# Patient Record
Sex: Female | Born: 1960 | Hispanic: No | Smoking: Never smoker
Health system: Southern US, Community
[De-identification: ages and names within clinical notes are randomized; demographics above are authoritative.]

## PROBLEM LIST (undated history)

## (undated) DIAGNOSIS — F329 Major depressive disorder, single episode, unspecified: Secondary | ICD-10-CM

## (undated) DIAGNOSIS — F419 Anxiety disorder, unspecified: Secondary | ICD-10-CM

## (undated) DIAGNOSIS — G4730 Sleep apnea, unspecified: Secondary | ICD-10-CM

## (undated) DIAGNOSIS — K760 Fatty (change of) liver, not elsewhere classified: Secondary | ICD-10-CM

## (undated) DIAGNOSIS — I471 Supraventricular tachycardia, unspecified: Secondary | ICD-10-CM

## (undated) DIAGNOSIS — K648 Other hemorrhoids: Secondary | ICD-10-CM

## (undated) DIAGNOSIS — K449 Diaphragmatic hernia without obstruction or gangrene: Secondary | ICD-10-CM

## (undated) DIAGNOSIS — E119 Type 2 diabetes mellitus without complications: Secondary | ICD-10-CM

## (undated) DIAGNOSIS — I10 Essential (primary) hypertension: Secondary | ICD-10-CM

## (undated) DIAGNOSIS — K603 Anal fistula, unspecified: Secondary | ICD-10-CM

## (undated) DIAGNOSIS — G894 Chronic pain syndrome: Secondary | ICD-10-CM

## (undated) DIAGNOSIS — M25551 Pain in right hip: Secondary | ICD-10-CM

## (undated) DIAGNOSIS — E785 Hyperlipidemia, unspecified: Secondary | ICD-10-CM

## (undated) DIAGNOSIS — K8020 Calculus of gallbladder without cholecystitis without obstruction: Secondary | ICD-10-CM

## (undated) DIAGNOSIS — R748 Abnormal levels of other serum enzymes: Secondary | ICD-10-CM

## (undated) DIAGNOSIS — L0291 Cutaneous abscess, unspecified: Secondary | ICD-10-CM

## (undated) DIAGNOSIS — M25552 Pain in left hip: Principal | ICD-10-CM

## (undated) DIAGNOSIS — F32A Depression, unspecified: Secondary | ICD-10-CM

## (undated) DIAGNOSIS — I639 Cerebral infarction, unspecified: Secondary | ICD-10-CM

## (undated) DIAGNOSIS — K5790 Diverticulosis of intestine, part unspecified, without perforation or abscess without bleeding: Secondary | ICD-10-CM

## (undated) HISTORY — PX: BREAST EXCISIONAL BIOPSY: SUR124

## (undated) HISTORY — DX: Fatty (change of) liver, not elsewhere classified: K76.0

## (undated) HISTORY — DX: Pain in right hip: M25.551

## (undated) HISTORY — DX: Calculus of gallbladder without cholecystitis without obstruction: K80.20

## (undated) HISTORY — DX: Other hemorrhoids: K64.8

## (undated) HISTORY — DX: Chronic pain syndrome: G89.4

## (undated) HISTORY — PX: BREAST SURGERY: SHX581

## (undated) HISTORY — DX: Diverticulosis of intestine, part unspecified, without perforation or abscess without bleeding: K57.90

## (undated) HISTORY — PX: INCISE AND DRAIN ABCESS: PRO64

## (undated) HISTORY — DX: Abnormal levels of other serum enzymes: R74.8

## (undated) HISTORY — DX: Diaphragmatic hernia without obstruction or gangrene: K44.9

## (undated) HISTORY — DX: Anal fistula: K60.3

## (undated) HISTORY — DX: Anal fistula, unspecified: K60.30

## (undated) HISTORY — DX: Hyperlipidemia, unspecified: E78.5

## (undated) HISTORY — DX: Pain in left hip: M25.552

## (undated) HISTORY — DX: Cutaneous abscess, unspecified: L02.91

## (undated) HISTORY — PX: KNEE ARTHROSCOPY: SUR90

---
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
## 1997-11-01 ENCOUNTER — Ambulatory Visit: Admission: RE | Admit: 1997-11-01 | Discharge: 1997-11-01 | Payer: Self-pay | Admitting: Otolaryngology

## 1998-12-24 ENCOUNTER — Emergency Department (HOSPITAL_COMMUNITY): Admission: EM | Admit: 1998-12-24 | Discharge: 1998-12-24 | Payer: Self-pay | Admitting: Emergency Medicine

## 1999-03-15 ENCOUNTER — Ambulatory Visit (HOSPITAL_COMMUNITY): Admission: RE | Admit: 1999-03-15 | Discharge: 1999-03-15 | Payer: Self-pay | Admitting: *Deleted

## 2000-09-22 ENCOUNTER — Emergency Department (HOSPITAL_COMMUNITY): Admission: EM | Admit: 2000-09-22 | Discharge: 2000-09-22 | Payer: Self-pay | Admitting: Emergency Medicine

## 2000-09-26 ENCOUNTER — Encounter: Admission: RE | Admit: 2000-09-26 | Discharge: 2000-09-26 | Payer: Self-pay | Admitting: Internal Medicine

## 2000-10-15 ENCOUNTER — Encounter: Admission: RE | Admit: 2000-10-15 | Discharge: 2000-10-15 | Payer: Self-pay | Admitting: Internal Medicine

## 2000-11-05 ENCOUNTER — Encounter: Admission: RE | Admit: 2000-11-05 | Discharge: 2000-11-05 | Payer: Self-pay | Admitting: Internal Medicine

## 2003-04-24 ENCOUNTER — Emergency Department (HOSPITAL_COMMUNITY): Admission: AD | Admit: 2003-04-24 | Discharge: 2003-04-24 | Payer: Self-pay | Admitting: Emergency Medicine

## 2003-05-07 ENCOUNTER — Emergency Department (HOSPITAL_COMMUNITY): Admission: AD | Admit: 2003-05-07 | Discharge: 2003-05-07 | Payer: Self-pay | Admitting: Family Medicine

## 2003-05-10 ENCOUNTER — Emergency Department (HOSPITAL_COMMUNITY): Admission: EM | Admit: 2003-05-10 | Discharge: 2003-05-10 | Payer: Self-pay | Admitting: Family Medicine

## 2003-05-13 ENCOUNTER — Emergency Department (HOSPITAL_COMMUNITY): Admission: EM | Admit: 2003-05-13 | Discharge: 2003-05-14 | Payer: Self-pay | Admitting: Emergency Medicine

## 2003-05-18 ENCOUNTER — Encounter: Admission: RE | Admit: 2003-05-18 | Discharge: 2003-05-18 | Payer: Self-pay | Admitting: General Surgery

## 2003-05-20 ENCOUNTER — Encounter (INDEPENDENT_AMBULATORY_CARE_PROVIDER_SITE_OTHER): Payer: Self-pay | Admitting: Specialist

## 2003-05-21 ENCOUNTER — Inpatient Hospital Stay (HOSPITAL_COMMUNITY): Admission: AD | Admit: 2003-05-21 | Discharge: 2003-05-23 | Payer: Self-pay | Admitting: General Surgery

## 2003-09-16 ENCOUNTER — Emergency Department (HOSPITAL_COMMUNITY): Admission: EM | Admit: 2003-09-16 | Discharge: 2003-09-16 | Payer: Self-pay | Admitting: Family Medicine

## 2004-08-27 ENCOUNTER — Emergency Department (HOSPITAL_COMMUNITY): Admission: EM | Admit: 2004-08-27 | Discharge: 2004-08-27 | Payer: Self-pay | Admitting: Family Medicine

## 2004-09-16 ENCOUNTER — Emergency Department (HOSPITAL_COMMUNITY): Admission: EM | Admit: 2004-09-16 | Discharge: 2004-09-16 | Payer: Self-pay | Admitting: Family Medicine

## 2007-01-17 ENCOUNTER — Emergency Department (HOSPITAL_COMMUNITY): Admission: EM | Admit: 2007-01-17 | Discharge: 2007-01-17 | Payer: Self-pay | Admitting: Emergency Medicine

## 2007-10-03 ENCOUNTER — Emergency Department (HOSPITAL_COMMUNITY): Admission: EM | Admit: 2007-10-03 | Discharge: 2007-10-03 | Payer: Self-pay | Admitting: Emergency Medicine

## 2007-11-01 ENCOUNTER — Emergency Department (HOSPITAL_COMMUNITY): Admission: EM | Admit: 2007-11-01 | Discharge: 2007-11-01 | Payer: Self-pay | Admitting: Family Medicine

## 2008-07-04 ENCOUNTER — Emergency Department (HOSPITAL_COMMUNITY): Admission: EM | Admit: 2008-07-04 | Discharge: 2008-07-04 | Payer: Self-pay | Admitting: Family Medicine

## 2008-07-11 ENCOUNTER — Emergency Department (HOSPITAL_COMMUNITY): Admission: EM | Admit: 2008-07-11 | Discharge: 2008-07-11 | Payer: Self-pay | Admitting: Family Medicine

## 2009-04-12 ENCOUNTER — Other Ambulatory Visit: Payer: Self-pay | Admitting: Specialist

## 2009-04-12 ENCOUNTER — Encounter: Admission: RE | Admit: 2009-04-12 | Discharge: 2009-04-12 | Payer: Self-pay | Admitting: Specialist

## 2009-04-14 ENCOUNTER — Other Ambulatory Visit: Payer: Self-pay | Admitting: Specialist

## 2009-04-14 ENCOUNTER — Inpatient Hospital Stay (HOSPITAL_COMMUNITY): Admission: EM | Admit: 2009-04-14 | Discharge: 2009-04-16 | Payer: Self-pay | Admitting: Orthopedic Surgery

## 2009-04-14 HISTORY — PX: SHOULDER SURGERY: SHX246

## 2009-10-15 ENCOUNTER — Emergency Department (HOSPITAL_COMMUNITY): Admission: EM | Admit: 2009-10-15 | Discharge: 2009-10-15 | Payer: Self-pay | Admitting: Emergency Medicine

## 2010-01-06 ENCOUNTER — Emergency Department (HOSPITAL_COMMUNITY): Admission: EM | Admit: 2010-01-06 | Discharge: 2010-01-06 | Payer: Self-pay | Admitting: Family Medicine

## 2010-03-28 ENCOUNTER — Emergency Department (HOSPITAL_COMMUNITY)
Admission: EM | Admit: 2010-03-28 | Discharge: 2010-03-28 | Payer: Self-pay | Source: Home / Self Care | Admitting: Family Medicine

## 2010-05-24 LAB — CULTURE, ROUTINE-ABSCESS

## 2010-05-26 LAB — CULTURE, ROUTINE-ABSCESS

## 2010-05-31 LAB — POCT PREGNANCY, URINE: Preg Test, Ur: NEGATIVE

## 2010-05-31 LAB — CBC
HCT: 36.8 % (ref 36.0–46.0)
Hemoglobin: 11.6 g/dL — ABNORMAL LOW (ref 12.0–15.0)
MCHC: 31.5 g/dL (ref 30.0–36.0)
MCHC: 31.6 g/dL (ref 30.0–36.0)
Platelets: 446 10*3/uL — ABNORMAL HIGH (ref 150–400)
RBC: 5.04 MIL/uL (ref 3.87–5.11)
RBC: 5.08 MIL/uL (ref 3.87–5.11)
RDW: 16 % — ABNORMAL HIGH (ref 11.5–15.5)

## 2010-05-31 LAB — POCT I-STAT, CHEM 8
Calcium, Ion: 1.2 mmol/L (ref 1.12–1.32)
HCT: 39 % (ref 36.0–46.0)
Hemoglobin: 13.3 g/dL (ref 12.0–15.0)
Sodium: 138 mEq/L (ref 135–145)
TCO2: 26 mmol/L (ref 0–100)

## 2010-05-31 LAB — HEMOGLOBIN A1C: Mean Plasma Glucose: 246 mg/dL

## 2010-05-31 LAB — BASIC METABOLIC PANEL
BUN: 12 mg/dL (ref 6–23)
BUN: 8 mg/dL (ref 6–23)
CO2: 23 mEq/L (ref 19–32)
Calcium: 8.9 mg/dL (ref 8.4–10.5)
Creatinine, Ser: 0.58 mg/dL (ref 0.4–1.2)
Creatinine, Ser: 0.71 mg/dL (ref 0.4–1.2)
GFR calc Af Amer: >60 mL/min (ref >60)
Glucose, Bld: 237 mg/dL — ABNORMAL HIGH (ref 70–99)
Glucose, Bld: 300 mg/dL — ABNORMAL HIGH (ref 70–99)

## 2010-05-31 LAB — GLUCOSE, CAPILLARY
Glucose-Capillary: 222 mg/dL — ABNORMAL HIGH (ref 70–99)
Glucose-Capillary: 223 mg/dL — ABNORMAL HIGH (ref 70–99)
Glucose-Capillary: 289 mg/dL — ABNORMAL HIGH (ref 70–99)
Glucose-Capillary: 339 mg/dL — ABNORMAL HIGH (ref 70–99)

## 2010-07-28 NOTE — Consult Note (Signed)
NAMEMARQUETA, PULLEY NO.:  [id number]   MEDICAL RECORD NO.:  [id number]                   PATIENT TYPE:  EMS   LOCATION:  ED                                   FACILITY:  [facility]   PHYSICIAN:  Angelia Mould. Derrell Lolling, M.D.             DATE OF BIRTH:  1960-12-23   DATE OF CONSULTATION:  05/14/2003  DATE OF DISCHARGE:  05/14/2003                                   CONSULTATION   REASON FOR CONSULTATION:  Evaluate painful mass, left breast.   HISTORY OF PRESENT ILLNESS:  This is a 50 year old white female with  diabetes and morbid obesity.  She states that she has had a left breast mass  which has been tender for about 3 weeks.  She has not had any trauma.  She  denies drainage.  She has been seen at [facility] 3 times in the  past 3 weeks.  She has been placed on antibiotics.  She denies fever or  chills.  Denies any prior breast infection or breast problems.  Her last  mammogram was done at [facility] 2 years ago, and was normal according  to the patient.   She came to the [facility] emergency room tonight stating that she was  frustrated because it was not getting better, but it was not getting worse.  She was evaluated by Dr. Juliene Pina.  Dr. Verlan Friends called me to  evaluate, concerned that this might be an abscess.   PAST HISTORY:  1. Morbid obesity.  2. Type 2 diabetes.  3. Hypertension.  4. She denies any history of cancer, heart disease, asthma, or stroke.   CURRENT MEDICATIONS:  1. Glucotrol.  2. Lipitor.  3. Some type of antihypertensive.   DRUG ALLERGIES:  None known.   SOCIAL HISTORY:  The patient lives in [place].  She lives with her  boyfriend.  She denies the use of alcohol or tobacco.  She does not have any  insurance.  She works at [employer] on [address].   FAMILY HISTORY:  Mother living at age 29 and has asthma.  Father died at age  58.  He was diabetic and had a stroke.  Three brothers and  3 sisters living  and well.   REVIEW OF SYSTEMS:  All systems were reviewed.  They are noncontributory,  except as described above.   PHYSICAL EXAMINATION:  GENERAL:  Alert, morbid obesity female in no apparent  distress.  VITAL SIGNS:  Temperature 99.2, pulse 106, respirations 22, blood pressure  156/92.  HEENT:  Eyes - sclerae are clear.  Extraocular movements intact.  Ears,  nose, mouth, and throat - nose, lips, tongue, and oropharynx with gross  lesions.  NECK:  Supple, nontender.  No adenopathy, no mass.  LUNGS:  Clear to auscultation.  No wheezing, no chest wall tenderness.  HEART:  Regular rate and rhythm.  No murmur.  BREASTS:  The  left breast is examined.  In the central portion of the left  breast slightly above the areola, but mostly behind the areola, there is  about a 15 cm transverse x 8-10 cm vertical firm mass.  There is slight  erythema.  This is somewhat tender, but not exquisitely tender.  It is  mostly a firm mass.  There is no fluctuance noted.  I do not feel any  adenopathy.  ABDOMEN:  Morbidly obese.  No palpable mass.  Liver and spleen not obviously  enlarged.   PROCEDURE NOTE:  The left breast was prepped and draped in a sterile  fashion.  One percent Xylocaine with epinephrine was used as local  infiltration anesthetic.  An 18 gauge needle was passed several times into  several areas of the left breast mass.  I really only got about 1.5 cc of  fluid out of the breast, and this did not look purulent.  I did send it for  culture.  After I removed the needle, a little bit of purulent fluid was  noted to be coming though the needle tract, but not much.   LABORATORY WORK:  Hemoglobin 9.6, white count 16,000.  Fingerstick glucose  363.   ASSESSMENT:  1. Cellulitis of the left breast.  While there is not an obvious abscess,     there could be an occult abscess that may require drainage and biopsy to     rule out cancer.  2. Morbid obesity.  3. Diabetes,  poorly controlled.  4. Hypertension.  5. Anemia of uncertain etiology.  6. No regular medical care.   PLAN:  1. I advised the patient to undergo examination under anesthesia and     incision and drainage of her left breast mass and left breast biopsy     tonight.  I told her I was concerned about the infection, and concerned     about her diabetes and her failure to respond to antibiotics.  She     declined to have the surgery.  I tried to talk her into this several     times, but she refused, stating that she had to go to work or she would     lose her job.  2. Since she refuses surgery, we are going to give her Rocephin 2 gm IV, and     place her on Augmentin 875 mg p.o. b.i.d. x10 days.  3. We are going to arrange for mammograms and a left breast ultrasound     tomorrow at the breast center.  The ER nurse is going to make those     arrangements.  4. I have asked her to come back and see me in the office in 3-4 days.  I     have called my office and left messages to create a chart for her so I     can see her as soon as she comes.   I have told her repeatedly that this is less than ideal therapy, and that  the breast infection could become more severe and more threatening in the  interim.  Despite my advise, she declines any surgical intervention at this  time.  She agrees to come back to the emergency room if the pain gets worse,  or she starts developing fever or skin changes.  Angelia Mould. Derrell Lolling, M.D.   HMI/MEDQ  D:  05/14/2003  T:  05/14/2003  Job:  [id number]   cc:   Juliene Pina, M.D.   [facility]   [facility]

## 2010-07-28 NOTE — Op Note (Signed)
[name], LEVEN NO.:  [id number]   MEDICAL RECORD NO.:  [id number]                   PATIENT TYPE:  OBV   LOCATION:  0450                                 FACILITY:  [facility]   PHYSICIAN:  Angelia Mould. Derrell Lolling, M.D.             DATE OF BIRTH:  [birth year]   DATE OF PROCEDURE:  05/20/2003  DATE OF DISCHARGE:                                 OPERATIVE REPORT   PREOPERATIVE DIAGNOSIS:  Left breast abscess.   POSTOPERATIVE DIAGNOSIS:  Left breast abscess.   OPERATION PERFORMED:  1. Incision and drainage of complex left breast abscess.  2. Left breast biopsy.   SURGEON:  Dr. Claud Kelp.   OPERATIVE INDICATIONS:  This is a morbidly obese 50 year old diabetic  female, who presented to the [facility] Emergency Room six days ago.  At  that point, she had had a tender left breast mass for about 3 weeks and had  been on two separate courses of antibiotics following visits to [facility].  At that time, I felt she had a left breast abscess and  advised admission to the hospital and drainage of the abscess under  anesthesia.  She declined to have that done and refused admission to the  hospital.  I was able to perform a needle aspiration and got a little bit of  purulent fluid, gave her some intravenous antibiotics in the emergency room  and placed her on oral Augmentin.  The cultures returned methicillin-  resistant Staphylococcus aureus.  She showed up in my office yesterday  stating that she was no worse but no better.  On exam, she has about a 10 cm  x 5 cm area of markedly thickened breast, mostly in the subareolar area and  extending medially.  This is not fluctuant.  The skin has a little bit of  edema overlying it.  We also sent her for imaging and bilateral mammograms  and a left breast ultrasound was performed on May 18, 2003.  No evidence of  malignancy, but there was evidence of a subareolar fluid collection.  I  convinced her to have  the fluid collection drained in the operating room and  convinced her to be admitted to the hospital because of the MRSA infection.   OPERATIVE TECHNIQUE:  Following the induction of general endotracheal  anesthesia, the patient's left breast was prepped and draped in a sterile  fashion.  I made an incision at the areolar margin superior and medially.  Dissection was carried straight down into a breast abscess which was  somewhat thin and without an odor.  I cultured this fluid again.  I broke up  a couple of loculations under the areolar and extending out medially but  once I did this, it seemed that I had completely evacuated everything.  The  tissue at the base of the wound were relatively soft, and it felt like an  abscess cavity wall.  The  tissues closer to the surface were extremely  thickened and almost fibrotic, and so I took a biopsy of these tissues near  the areolar and sent that for routine histology.  Hemostasis was adequate  and achieved with electrocautery.  The wound was irrigated with saline.  I  placed a single suture, closing the skin in the center of the wound but  mostly without the skin opened and placed Penrose drains on either side,  sewing the  Penrose drains in with a nylon suture.  The wound was irrigated one more  time.  A fluffy absorbent bandage was placed and the patient taken to the  recovery room in stable condition.  The estimated blood loss was about 25  mL.  Complications none.  Sponge, needle, and instrument counts were  correct.                                               Angelia Mould. Derrell Lolling, M.D.    HMI/MEDQ  D:  05/20/2003  T:  05/20/2003  Job:  [id number]   cc:   [facility]

## 2010-09-27 ENCOUNTER — Other Ambulatory Visit (INDEPENDENT_AMBULATORY_CARE_PROVIDER_SITE_OTHER): Payer: Self-pay | Admitting: General Surgery

## 2010-09-27 ENCOUNTER — Ambulatory Visit (HOSPITAL_COMMUNITY)
Admission: RE | Admit: 2010-09-27 | Discharge: 2010-09-27 | Disposition: A | Discharge status: Home / Self Care | Payer: BC Managed Care – PPO | Source: Ambulatory Visit | Attending: General Surgery | Admitting: General Surgery

## 2010-09-27 ENCOUNTER — Encounter (HOSPITAL_COMMUNITY)
Admission: RE | Admit: 2010-09-27 | Discharge: 2010-09-27 | Disposition: A | Discharge status: Home / Self Care | Payer: BC Managed Care – PPO | Source: Ambulatory Visit | Attending: General Surgery | Admitting: General Surgery

## 2010-09-27 DIAGNOSIS — Z01811 Encounter for preprocedural respiratory examination: Secondary | ICD-10-CM

## 2010-09-27 DIAGNOSIS — Z01812 Encounter for preprocedural laboratory examination: Secondary | ICD-10-CM | POA: Insufficient documentation

## 2010-09-27 DIAGNOSIS — Z01810 Encounter for preprocedural cardiovascular examination: Secondary | ICD-10-CM | POA: Insufficient documentation

## 2010-09-27 DIAGNOSIS — K449 Diaphragmatic hernia without obstruction or gangrene: Secondary | ICD-10-CM | POA: Insufficient documentation

## 2010-09-27 LAB — BASIC METABOLIC PANEL
BUN: 8 mg/dL (ref 6–23)
CO2: 24 mEq/L (ref 19–32)
Chloride: 103 mEq/L (ref 96–112)
Creatinine, Ser: 0.56 mg/dL (ref 0.50–1.10)
Glucose, Bld: 176 mg/dL — ABNORMAL HIGH (ref 70–99)

## 2010-09-27 LAB — DIFFERENTIAL
Eosinophils Absolute: 0.4 10*3/uL (ref 0.0–0.7)
Lymphs Abs: 3.8 10*3/uL (ref 0.7–4.0)
Monocytes Relative: 6 % (ref 3–12)
Neutro Abs: 9.9 10*3/uL — ABNORMAL HIGH (ref 1.7–7.7)
Neutrophils Relative %: 66 % (ref 43–77)

## 2010-09-27 LAB — CBC
Hemoglobin: 11.7 g/dL — ABNORMAL LOW (ref 12.0–15.0)
MCH: 23.1 pg — ABNORMAL LOW (ref 26.0–34.0)
MCV: 70.9 fL — ABNORMAL LOW (ref 78.0–100.0)
RBC: 5.06 MIL/uL (ref 3.87–5.11)

## 2010-09-29 ENCOUNTER — Ambulatory Visit (HOSPITAL_COMMUNITY)
Admission: RE | Admit: 2010-09-29 | Discharge: 2010-10-01 | Disposition: A | Discharge status: Home / Self Care | Payer: BC Managed Care – PPO | Source: Ambulatory Visit | Attending: General Surgery | Admitting: General Surgery

## 2010-09-29 ENCOUNTER — Other Ambulatory Visit (INDEPENDENT_AMBULATORY_CARE_PROVIDER_SITE_OTHER): Payer: Self-pay | Admitting: General Surgery

## 2010-09-29 DIAGNOSIS — L732 Hidradenitis suppurativa: Secondary | ICD-10-CM | POA: Insufficient documentation

## 2010-09-29 DIAGNOSIS — E669 Obesity, unspecified: Secondary | ICD-10-CM | POA: Insufficient documentation

## 2010-10-05 ENCOUNTER — Telehealth (INDEPENDENT_AMBULATORY_CARE_PROVIDER_SITE_OTHER): Payer: Self-pay | Admitting: General Surgery

## 2010-10-05 NOTE — Telephone Encounter (Signed)
Pt called on 10-04-10 re pain med refill. Dr. Carolynne Edouard said she could have vicodin 5/500 called in or refill Percocet. Pt req Percocet. Dr. Carolynne Edouard will write RX today for pt to pick-up. Complete. RX for Percocet at front desk. Pt aware.

## 2010-10-10 NOTE — Op Note (Signed)
[name], [name] NO.:  [id number]  MEDICAL RECORD NO.:  [id number]  LOCATION:  SDSC                         FACILITY:  [facility]  PHYSICIAN:  Ollen Gross. Vernell Morgans, M.D. DATE OF BIRTH:  02-05-1961  DATE OF PROCEDURE:  09/29/2010 DATE OF DISCHARGE:                              OPERATIVE REPORT   PREOPERATIVE DIAGNOSIS:  Hidradenitis in the right groin area and left perirectal area.  POSTOPERATIVE DIAGNOSIS:  Hidradenitis in the right groin area and left perirectal area.  PROCEDURE:  Incision and drainage of right groin and left perirectal hidradenitis and excision of hidradenitis from the right groin.  SURGEON:  Ollen Gross. Vernell Morgans, MD  ANESTHESIA:  General endotracheal.  PROCEDURE:  After informed consent was obtained, the patient was brought to the operating room and placed in supine position on the operating room table.  After adequate induction of general anesthesia, the patient was placed in a frog leg-type position.  We could see the area in the right upper thigh and groin area.  This area was prepped with Betadine and draped in the usual sterile manner.  The area on the upper inner thigh was pretty well circumscribed.  We were able to remove this area completely by ellipsing it out.  The incision was made with #10 blade knife around the area and then the incision was carried down through the skin and subcutaneous fat sharply with electrocautery until the area was completely excised.  We never ran into any purulence there.  The wound was wide open and looked good.  We irrigated with copious amounts of saline, infiltrated with Exparel, and then closed it primarily with 3-0 nylon interrupted vertical mattress stitches.  Once this was accomplished, I placed antibiotic ointment on it and covered it with sterile gauze.  Attention was then turned higher in the right groin area.  There was a small draining area.  We probed this with a silver probe and then  opened it sharply with electrocautery.  We fulgurated the granulation tissue.  I probed it with my finger and it did not track any further.  We then packed it with gauze and applied a sterile dressing. She tolerated this portion well.  Next, we then moved back onto the stretcher and then moved her into a prone position on the operating room table.  All pressure points were padded.  Her buttocks were retracted laterally with tape.  Her perirectal area was then prepped with Betadine and draped in the usual sterile manner.  In the left perirectal space, the patient had some hidradenitis.  All the openings connected to each other.  We first probed them with silver probes and opened the tracts. We then excised the granulation tissue and skin openings sharply with electrocautery back to healthy fatty tissue.  Hemostasis was achieved using Bovie electrocautery.  The wound was infiltrated with Exparel and then once it was clean it was packed with gauze and sterile dressings were applied. The patient tolerated the procedure well.  At the end of the case, all needle, sponge, and instrument counts were correct.  The patient was awakened and taken to the recovery room in stable condition.     Ollen Gross. Vernell Morgans,  M.D.     PST/MEDQ  D:  09/29/2010  T:  09/29/2010  Job:  [id number]  Electronically Signed by Chevis Pretty III M.D. on 10/10/2010 03:49:56 AM

## 2010-10-11 ENCOUNTER — Telehealth (INDEPENDENT_AMBULATORY_CARE_PROVIDER_SITE_OTHER): Payer: Self-pay | Admitting: General Surgery

## 2010-10-11 NOTE — Telephone Encounter (Signed)
PT CALLED RE PAIN MED REFILL/ STANDARD POST-OP REFILL HYDROCODONE 5/325 #30 [facility] [address]/ [phone]. PER PROTOCOL.

## 2010-10-17 ENCOUNTER — Encounter (INDEPENDENT_AMBULATORY_CARE_PROVIDER_SITE_OTHER): Payer: Self-pay | Admitting: General Surgery

## 2010-10-17 ENCOUNTER — Ambulatory Visit (INDEPENDENT_AMBULATORY_CARE_PROVIDER_SITE_OTHER): Payer: BC Managed Care – PPO | Admitting: General Surgery

## 2010-10-17 DIAGNOSIS — L732 Hidradenitis suppurativa: Secondary | ICD-10-CM | POA: Insufficient documentation

## 2010-10-17 NOTE — Patient Instructions (Signed)
Continue to shower and keep clean gauze on areas

## 2010-10-19 NOTE — Progress Notes (Signed)
Subjective:     Patient ID: [name], female   DOB: September 13, 1960, 50 y.o.   MRN: [id number]  HPI The patient is a 50 year old female who is now about 3 weeks out from excision of some hidradenitis from her right inner thigh and her perirectal region. She's had some clearish drainage from the thigh wound but otherwise seems to be doing well. She denies any fevers or chills Review of Systems     Objective:   Physical Exam On exam the right thigh wound looks relatively clean. We were able to remove all the stitches today and she tolerated this well. Her perirectal wound is very clean and open. It is getting shallow with good granulation tissue.    Assessment:     3 weeks postop from excision of hidradenitis from the right inner thigh and perirectal region.    Plan:     She will continue to shower and change the dressings daily. Overall she seems to be healing nicely. We will plan to see her back in about 2-3 weeks to check her progress.

## 2010-10-30 ENCOUNTER — Encounter (INDEPENDENT_AMBULATORY_CARE_PROVIDER_SITE_OTHER): Payer: Self-pay | Admitting: General Surgery

## 2010-10-31 ENCOUNTER — Ambulatory Visit (INDEPENDENT_AMBULATORY_CARE_PROVIDER_SITE_OTHER): Payer: BC Managed Care – PPO | Admitting: General Surgery

## 2010-10-31 ENCOUNTER — Encounter (INDEPENDENT_AMBULATORY_CARE_PROVIDER_SITE_OTHER): Payer: Self-pay | Admitting: General Surgery

## 2010-10-31 DIAGNOSIS — L732 Hidradenitis suppurativa: Secondary | ICD-10-CM

## 2010-10-31 NOTE — Progress Notes (Signed)
Subjective:     Patient ID: [name], female   DOB: June 02, 1960, 51 y.o.   MRN: [id number]  HPI The patient is a 50 year old female who is now several weeks out from a an excision of hidradenitis from her right inner thigh and from her perirectal region. She is continuing to improve. She is noting less pain and less drainage from each area. She is actually having no drainage now from the right thigh incision.  Review of Systems     Objective:   Physical Exam On exam her right inner thigh incision is healing nicely. There is no drainage or evidence of infection. In the anterior right groin there is a small open area with good granulation tissue that is clean. The left pararectal space also is clean and flat with good granulation tissue. This area is almost completely epithelialized.    Assessment:     Several weeks out from excision of hidradenitis in the right inner thigh and perirectal area    Plan:     The wounds all look very clean. Encouraged her to continue to shower and keep these areas clean and dry. We'll plan to see her back in about 3 weeks to check her progress.

## 2010-10-31 NOTE — Patient Instructions (Signed)
Continue to wash areas daily and keep them clean and dry

## 2010-11-06 ENCOUNTER — Telehealth (INDEPENDENT_AMBULATORY_CARE_PROVIDER_SITE_OTHER): Payer: Self-pay | Admitting: General Surgery

## 2010-11-06 NOTE — Telephone Encounter (Signed)
PT CALLED FOR PAIN MED REFILL. SHE WOULD LIKE SOMETHING LESS STRONG. STANDARD HYDROCODONE 5/325MG  # 30 [facility] [address] [phone]. PT AWARE.

## 2010-11-17 ENCOUNTER — Telehealth (INDEPENDENT_AMBULATORY_CARE_PROVIDER_SITE_OTHER): Payer: Self-pay | Admitting: General Surgery

## 2010-11-17 NOTE — Telephone Encounter (Signed)
FAXED REQ FOR HYDROCODONE-ACET 5-325 MG #30 RECEIVED FROM [facility]/ [phone]/ PHARMACY NOTIFIED OF OK FOR REFILL X1.

## 2010-11-20 ENCOUNTER — Encounter (INDEPENDENT_AMBULATORY_CARE_PROVIDER_SITE_OTHER): Payer: Self-pay | Admitting: General Surgery

## 2010-11-20 ENCOUNTER — Ambulatory Visit (INDEPENDENT_AMBULATORY_CARE_PROVIDER_SITE_OTHER): Payer: BC Managed Care – PPO | Admitting: General Surgery

## 2010-11-20 VITALS — BP 142/88 | HR 70 | Temp 98.3°F

## 2010-11-20 DIAGNOSIS — L732 Hidradenitis suppurativa: Secondary | ICD-10-CM

## 2010-11-20 NOTE — Patient Instructions (Signed)
Keep area clean and keep dry gauze on area as needed

## 2010-11-21 ENCOUNTER — Telehealth (INDEPENDENT_AMBULATORY_CARE_PROVIDER_SITE_OTHER): Payer: Self-pay | Admitting: General Surgery

## 2010-11-21 NOTE — Telephone Encounter (Signed)
PT CALLED REQUESTING PAIN MED REFILL. DR. Carolynne Edouard OK'D REFILL FOR VICODIN 5/325 #20 CALLED TO [facility] [address] [phone]. PT AWARE. LAST REFILL PER PHARMACY WAS ON 11-17-10.

## 2010-11-23 NOTE — Progress Notes (Signed)
Subjective:     Patient ID: [name], female   DOB: Aug 20, 1960, 50 y.o.   MRN: [id number]  HPI The patient is a 50 year old female who is several weeks out now from excision of some hidradenitis from her right inner thigh as well as her perirectal region. Since her last visit she is developed some increasing pain and bleeding from the perirectal wound. She denies any fevers.  Review of Systems     Objective:   Physical Exam On exam the right inner thigh wound is very clean and seems to have healed  nicely. The perirectal wound actually seems to be almost completely healed but she has some proud granulation tissue with some drainage from this area. I probed this area with a Q-tip and it is very localized and does not track anywhere. Would try to destroy some of this granulation tissue today with some silver nitrate.       Assessment:       Several weeks out from excision of hidradenitis in the right inner thigh and perirectal region  Plan:     She will continue to keep the area clean and dry. We will see her back in 3 or 4 weeks to check her progress.

## 2010-11-29 ENCOUNTER — Telehealth (INDEPENDENT_AMBULATORY_CARE_PROVIDER_SITE_OTHER): Payer: Self-pay | Admitting: General Surgery

## 2010-11-29 NOTE — Telephone Encounter (Signed)
[facility] REQUEST RECE[facility]ED [facility] HYDROCODONE-ACET 5-325MG  #20. RX APPROVED X1 PER T.O. DR. P. TOTH.

## 2010-12-08 LAB — POCT I-STAT, CHEM 8
Creatinine, Ser: 0.9
Hemoglobin: 13.6
Potassium: 3.3 — ABNORMAL LOW
Sodium: 138

## 2010-12-08 LAB — POCT CARDIAC MARKERS
CKMB, poc: 1.2
Myoglobin, poc: 65.9

## 2010-12-11 ENCOUNTER — Telehealth (INDEPENDENT_AMBULATORY_CARE_PROVIDER_SITE_OTHER): Payer: Self-pay

## 2010-12-11 NOTE — Telephone Encounter (Signed)
Pt requested more Hydrocodone.  Per Dr Carolynne Edouard, she can have #30 of the Hydrocodone  5/325 and 1 refill.  [facility] [phone].

## 2010-12-25 ENCOUNTER — Ambulatory Visit (INDEPENDENT_AMBULATORY_CARE_PROVIDER_SITE_OTHER): Payer: BC Managed Care – PPO | Admitting: General Surgery

## 2010-12-25 ENCOUNTER — Encounter (INDEPENDENT_AMBULATORY_CARE_PROVIDER_SITE_OTHER): Payer: Self-pay | Admitting: General Surgery

## 2010-12-25 VITALS — BP 160/100 | HR 90 | Temp 98.4°F | Ht 63.0 in | Wt 261.4 lb

## 2010-12-25 DIAGNOSIS — L0231 Cutaneous abscess of buttock: Secondary | ICD-10-CM

## 2010-12-25 NOTE — Progress Notes (Signed)
Patient returns to the office status post drainage and excision of hidradenitis of the right inner thigh and also incision and drainage of a buttock abscess likely secondary to hidradenitis done by Dr. Carolynne Edouard about 3 months ago. Her thigh is completely healed but he has been following the buttock wound for some persistent drainage. She states that for 5 days ago she developed increasing pain and drainage in the area. She called the office for an urgent appointment. New  Pertinent exam shows a partially healed surgical site at the left buttock an area of granulation and purulent drainage and small area of fluctuance underneath this.  I recommended incision and drainage under local in the office and she was agreeable. I unroofed a small cavity about a centimeter in diameter back underneath the previous scar. This was packed with iodoform gauze. She is given a prescription for Vicodin and one week of Keflex  She'll return to see Dr. Carolynne Edouard in 2-3 weeks.

## 2010-12-25 NOTE — Patient Instructions (Signed)
Removed bandage and packing tomorrow and then begin daily soaks and keep covered. Call if it is feeling any worse but otherwise keep her scheduled appointment in 2-3 weeks.

## 2010-12-27 ENCOUNTER — Telehealth (INDEPENDENT_AMBULATORY_CARE_PROVIDER_SITE_OTHER): Payer: Self-pay | Admitting: General Surgery

## 2010-12-27 NOTE — Telephone Encounter (Signed)
Pt called today and stated that the vicodin is not helping with the pain. She would like something stronger. [facility] uses [facility] [address] [phone]. Allergies to Tramadol. You can call her on her cell [phone] you can leave a message

## 2010-12-28 ENCOUNTER — Telehealth (INDEPENDENT_AMBULATORY_CARE_PROVIDER_SITE_OTHER): Payer: Self-pay | Admitting: General Surgery

## 2010-12-28 ENCOUNTER — Encounter (INDEPENDENT_AMBULATORY_CARE_PROVIDER_SITE_OTHER): Payer: BC Managed Care – PPO | Admitting: General Surgery

## 2010-12-28 NOTE — Telephone Encounter (Signed)
NO PAIN MED REFILL PER DR. TOTH DUE TO PHARMACY CALL RE MULTIPLE RX'S FOR PAIN MEDICATIONS FROM OTHER DR.'S

## 2010-12-28 NOTE — Telephone Encounter (Signed)
Pt called in stating she wanted another prescription issued, that the hydrocodone currently prescribed only works for an hour according to pt. Pt stated called yesterday and wanted to know if it will be issued. She can be called back at [phone] and a message can be left on vmail if she is unable to pick up.

## 2010-12-28 NOTE — Telephone Encounter (Signed)
Per Dr. Carolynne Edouard patient is to follow up with him,  No prescription will be written without physical evaluation from Dr. Carolynne Edouard.

## 2011-01-19 ENCOUNTER — Encounter (INDEPENDENT_AMBULATORY_CARE_PROVIDER_SITE_OTHER): Payer: Self-pay | Admitting: General Surgery

## 2011-01-19 ENCOUNTER — Ambulatory Visit (INDEPENDENT_AMBULATORY_CARE_PROVIDER_SITE_OTHER): Payer: BC Managed Care – PPO | Admitting: General Surgery

## 2011-01-19 ENCOUNTER — Other Ambulatory Visit (INDEPENDENT_AMBULATORY_CARE_PROVIDER_SITE_OTHER): Payer: Self-pay | Admitting: General Surgery

## 2011-01-19 VITALS — BP 164/80 | HR 96 | Temp 99.4°F | Resp 16 | Ht 63.0 in | Wt 254.2 lb

## 2011-01-19 DIAGNOSIS — K603 Anal fistula: Secondary | ICD-10-CM

## 2011-01-19 NOTE — Progress Notes (Signed)
Subjective:     Patient ID: [name], female   DOB: 06/10/1960, 50 y.o.   MRN: [id number]  HPI The patient is a 50 year old white female who is now a months out from excision of some hidradenitis from her right thigh as well as her left perirectal region. The right thigh area healed up very nicely. She's had persistent pain and drainage from the left perirectal region.  Review of Systems  Constitutional: Negative.   HENT: Negative.   Eyes: Negative.   Respiratory: Negative.   Cardiovascular: Negative.   Gastrointestinal: Positive for blood in stool and rectal pain.  Genitourinary: Negative.   Musculoskeletal: Negative.   Skin: Negative.   Neurological: Negative.   Hematological: Negative.   Psychiatric/Behavioral: Negative.        Objective:   Physical Exam  Constitutional: She is oriented to person, place, and time. She appears well-developed and well-nourished.  HENT:  Head: Normocephalic and atraumatic.  Eyes: Conjunctivae and EOM are normal. Pupils are equal, round, and reactive to light.  Neck: Neck supple.  Cardiovascular: Normal rate, regular rhythm and normal heart sounds.   Pulmonary/Chest: Effort normal and breath sounds normal.  Abdominal: Soft. Bowel sounds are normal.  Genitourinary:       The patient has been persistent open area in the left pararectal space. Today I can feel a palpable cord running from this area to the rectum.  Musculoskeletal: Normal range of motion.  Neurological: She is alert and oriented to person, place, and time.  Skin: Skin is warm and dry.  Psychiatric: She has a normal mood and affect. Her behavior is normal.       Assessment:     Couple months postop from excision of some hidradenitis. 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 She would then be a candidate for either a fistulotomy or placement of a seton. I've discussed in detail the risks and benefits of the surgery to do this as well as some of the technical aspects and she understands and wishes to proceed

## 2011-02-06 ENCOUNTER — Encounter (HOSPITAL_COMMUNITY): Payer: Self-pay | Admitting: Pharmacy Technician

## 2011-02-07 ENCOUNTER — Encounter (HOSPITAL_COMMUNITY): Payer: Self-pay | Admitting: *Deleted

## 2011-02-07 ENCOUNTER — Emergency Department (HOSPITAL_COMMUNITY)
Admission: EM | Admit: 2011-02-07 | Discharge: 2011-02-08 | Disposition: A | Discharge status: Home / Self Care | Payer: BC Managed Care – PPO | Attending: Emergency Medicine | Admitting: Emergency Medicine

## 2011-02-07 DIAGNOSIS — Z09 Encounter for follow-up examination after completed treatment for conditions other than malignant neoplasm: Secondary | ICD-10-CM | POA: Insufficient documentation

## 2011-02-07 DIAGNOSIS — L02419 Cutaneous abscess of limb, unspecified: Secondary | ICD-10-CM

## 2011-02-07 DIAGNOSIS — IMO0002 Reserved for concepts with insufficient information to code with codable children: Secondary | ICD-10-CM | POA: Insufficient documentation

## 2011-02-07 DIAGNOSIS — I10 Essential (primary) hypertension: Secondary | ICD-10-CM | POA: Insufficient documentation

## 2011-02-07 HISTORY — DX: Essential (primary) hypertension: I10

## 2011-02-07 HISTORY — DX: Anxiety disorder, unspecified: F41.9

## 2011-02-07 NOTE — ED Notes (Signed)
Pt states that she has had a right axillary abscess for past two weeks. States that abscess has gotten larger and now is an open wound with pus draining from the site. Pt states that she has frequent axillary abscesses from overactive sweat glands. Pt has a visible abscess on right axilla that is has an opening approximately the size of a quarter with green exudate. Pt reports abscess painful to the touch and with movement. Pt reports pain is 8/10. Pt denies N/V or fever with the abscess. Site cleaned with sterile saline and sterile gauze applied to the site.

## 2011-02-08 ENCOUNTER — Encounter (HOSPITAL_COMMUNITY): Payer: Self-pay | Admitting: Respiratory Therapy

## 2011-02-08 MED ORDER — DOXYCYCLINE HYCLATE 100 MG PO CAPS: 100.0000 mg | ORAL_CAPSULE | Freq: Two times a day (BID) | ORAL | Status: AC

## 2011-02-08 MED ORDER — HYDROCODONE-ACETAMINOPHEN 5-325 MG PO TABS: 1.0000 | ORAL_TABLET | ORAL | Status: DC | PRN

## 2011-02-08 NOTE — ED Provider Notes (Signed)
History     CSN: [id number] Arrival date & time: 02/07/2011  8:19 PM   First MD Initiated Contact with Patient 02/07/11 2358      Chief Complaint  Patient presents with  . Abscess    (Consider location/radiation/quality/duration/timing/severity/associated sxs/prior treatment) Patient is a 50 y.o. female presenting with abscess. The history is provided by the patient.  Abscess  This is a recurrent problem. The current episode started more than one week ago. The problem occurs continuously. Progression since onset: She has history of multiple recurrent abscesses. Current abscess x 2 weeks, which opened yesterday and drained. She reports continued pain. Affected Location: Right axilla. The problem is moderate. The abscess is characterized by painfulness. Pertinent negatives include no fever.    Past Medical History  Diagnosis Date  . Abscess   . Sleep difficulties   . Fatigue   . Sinus problem   . Encounter for drainage of abscess     increased drainage from abscess on buttock  . Abscess of buttock   . Hypertension   . Anxiety     Past Surgical History  Procedure Date  . Breast surgery     pull fluid off lft br  . Knee arthroscopy     left  . Shoulder surgery     right  . Incise and drain abcess     abscess on right thigh and buttock    Family History  Problem Relation Age of Onset  . Hypertension Mother   . Alzheimer's disease Mother   . Diabetes Father     History  Substance Use Topics  . Smoking status: Never Smoker   . Smokeless tobacco: Not on file  . Alcohol Use: No    OB History    Grav Para Term Preterm Abortions TAB SAB Ect Mult Living   1 1  1             Review of Systems  Constitutional: Negative for fever and chills.  HENT: Negative.   Respiratory: Negative.   Cardiovascular: Negative.   Gastrointestinal: Negative.  Negative for nausea.  Musculoskeletal: Negative.   Skin:       C/O Abscess.  Neurological: Negative.     Allergies    Tramadol  Home Medications   Current Outpatient Rx  Name Route Sig Dispense Refill  . HYDROCODONE-ACETAMINOPHEN 5-325 MG PO TABS Oral Take 1 tablet by mouth as needed.      Marland Kitchen LISINOPRIL-HYDROCHLOROTHIAZIDE 10-12.5 MG PO TABS Oral Take 1 tablet by mouth daily.      Marland Kitchen NAPROXEN SODIUM 220 MG PO TABS Oral Take 220 mg by mouth 2 (two) times daily with a meal.      . SERTRALINE HCL 100 MG PO TABS Oral Take 100 mg by mouth daily.      . OXYCODONE HCL 5 MG PO CAPS Oral Take 5 mg by mouth every 4 (four) hours as needed.        BP 156/75  Pulse 97  Temp(Src) 98.7 F (37.1 C) (Oral)  Resp 18  SpO2 99%  LMP 02/07/2011  Physical Exam  Constitutional: She is oriented to person, place, and time. She appears well-developed and well-nourished.  Neck: Normal range of motion.  Pulmonary/Chest: Effort normal.  Musculoskeletal:       Large opened abscess to right axilla. Minimal induration. No active drainage though abscess is open.  Neurological: She is alert and oriented to person, place, and time.  Skin: Skin is warm and dry.  ED Course  Procedures (including critical care time)  Labs Reviewed - No data to display No results found.   No diagnosis found.    MDM          Rodena Medin, PA 02/08/11 [phone]

## 2011-02-08 NOTE — ED Provider Notes (Signed)
Medical screening examination/treatment/procedure(s) were performed by non-physician practitioner and as supervising physician I was immediately available for consultation/collaboration.  Olivia Mackie, MD 02/08/11 [phone]

## 2011-02-13 ENCOUNTER — Encounter (HOSPITAL_COMMUNITY)
Admission: RE | Admit: 2011-02-13 | Discharge: 2011-02-13 | Disposition: A | Discharge status: Home / Self Care | Payer: BC Managed Care – PPO | Source: Ambulatory Visit | Attending: General Surgery | Admitting: General Surgery

## 2011-02-13 ENCOUNTER — Encounter (HOSPITAL_COMMUNITY): Payer: Self-pay

## 2011-02-13 HISTORY — DX: Depression, unspecified: F32.A

## 2011-02-13 HISTORY — DX: Major depressive disorder, single episode, unspecified: F32.9

## 2011-02-13 LAB — BASIC METABOLIC PANEL
BUN: 15 mg/dL (ref 6–23)
Chloride: 100 mEq/L (ref 96–112)
GFR calc Af Amer: >90 mL/min (ref >90)
GFR calc non Af Amer: >90 mL/min (ref >90)
Glucose, Bld: 326 mg/dL — ABNORMAL HIGH (ref 70–99)
Potassium: 4 mEq/L (ref 3.5–5.1)
Sodium: 135 mEq/L (ref 135–145)

## 2011-02-13 LAB — SURGICAL PCR SCREEN
MRSA, PCR: NEGATIVE
Staphylococcus aureus: NEGATIVE

## 2011-02-13 LAB — CBC
HCT: 36.8 % (ref 36.0–46.0)
Hemoglobin: 11.6 g/dL — ABNORMAL LOW (ref 12.0–15.0)
MCHC: 31.5 g/dL (ref 30.0–36.0)
RBC: 5.08 MIL/uL (ref 3.87–5.11)

## 2011-02-13 NOTE — Progress Notes (Signed)
Ekg and CXR done 09/27/10.

## 2011-02-13 NOTE — Pre-Procedure Instructions (Signed)
20 [name]  02/13/2011   Your procedure is scheduled on:  Wednesday February 21, 2011  Report to [facility] Stay [facility] at 0900 AM.  Call this number if you have problems the morning of surgery: [phone]   Remember:   Do not eat food:After Midnight.  May have clear liquids: up to 4 Hours before arrival.(nothing after 0500am)  Clear liquids include soda, tea, black coffee, apple or grape juice, broth.  Take these medicines the morning of surgery with A SIP OF WATER: oxycodone, zoloft, doxycycline, hydrocodone   Do not wear jewelry, make-up or nail polish.  Do not wear lotions, powders, or perfumes. You may wear deodorant.  Do not shave 48 hours prior to surgery.  Do not bring valuables to the hospital.  Contacts, dentures or bridgework may not be worn into surgery.  Leave suitcase in the car. After surgery it may be brought to your room.  For patients admitted to the hospital, checkout time is 11:00 AM the day of discharge.   Patients discharged the day of surgery will not be allowed to drive home.  Name and phone number of your driver: Steffanie Rainwater [phone]  Special Instructions: CHG Shower Use Special Wash: 1/2 bottle night before surgery and 1/2 bottle morning of surgery.   Please read over the following fact sheets that you were given: Pain Booklet, Coughing and Deep Breathing, MRSA Information and Surgical Site Infection Prevention

## 2011-02-13 NOTE — Progress Notes (Signed)
Per anesthesia guidelines CBC within parameters.

## 2011-02-14 NOTE — Consult Note (Signed)
Anesthesia:  50 year old female for exam of anal fistula under GA.  History includes HTN and depression.  She did not report a history of DM, but her preoperative glucose was 326.  I have spoken with [facility] Triage Nurse Marcelino Duster regarding their office contacting her PCP so treatment can be initiated, as she will need better glucose management prior to surgery. Marcelino Duster was going to pass this message to Dr. Billey Chang nurse.  Epic lists her PCP as Dr. Mickie Hillier.    I have asked that a CBG be done on arrival to Short Stay on the day of surgery.

## 2011-02-20 MED ORDER — DEXTROSE 5 % IV SOLN
2.0000 g | INTRAVENOUS | Status: AC
  Administered 2011-02-21: 2 g via INTRAVENOUS
  Filled 2011-02-20: qty 2

## 2011-02-20 NOTE — Consult Note (Signed)
Anesthesia:  See my consult note from 02/14/11.  I have yet to get an update from [name] office, so I contacted the patient who had not been notified of her elevated glucose.  She reports that she has been told that she is a borderline diabetic, but up to this point had not been actually diagnosed with DM2.  She is not on any anti-hyperglycemic agents.  Her PCP is [facility], [facility] at [facility].  I notified [name], and she will have her office contact [name] to schedule an appointment in the near future to discuss hyperglycemic management.    I reviewed her labs with [name].  Due to the nature of this procedure, Anesthesia would plan to proceed and treat her hyperglycemia as needed perioperatively.    I left a voice mail with [name] nurse [name].  I have ordered a CBG on arrival to Short Stay.

## 2011-02-21 ENCOUNTER — Encounter (HOSPITAL_COMMUNITY): Payer: Self-pay | Admitting: Vascular Surgery

## 2011-02-21 ENCOUNTER — Ambulatory Visit (HOSPITAL_COMMUNITY)
Admission: RE | Admit: 2011-02-21 | Discharge: 2011-02-21 | Disposition: A | Discharge status: Home / Self Care | Payer: BC Managed Care – PPO | Source: Ambulatory Visit | Attending: General Surgery | Admitting: General Surgery

## 2011-02-21 ENCOUNTER — Ambulatory Visit (HOSPITAL_COMMUNITY): Payer: BC Managed Care – PPO | Admitting: Vascular Surgery

## 2011-02-21 ENCOUNTER — Encounter (HOSPITAL_COMMUNITY)
Admission: RE | Disposition: A | Discharge status: Home / Self Care | Payer: Self-pay | Source: Ambulatory Visit | Attending: General Surgery

## 2011-02-21 ENCOUNTER — Other Ambulatory Visit (INDEPENDENT_AMBULATORY_CARE_PROVIDER_SITE_OTHER): Payer: Self-pay | Admitting: General Surgery

## 2011-02-21 ENCOUNTER — Encounter (HOSPITAL_COMMUNITY): Payer: Self-pay

## 2011-02-21 DIAGNOSIS — K603 Anal fistula, unspecified: Secondary | ICD-10-CM | POA: Insufficient documentation

## 2011-02-21 DIAGNOSIS — E119 Type 2 diabetes mellitus without complications: Secondary | ICD-10-CM | POA: Insufficient documentation

## 2011-02-21 DIAGNOSIS — I10 Essential (primary) hypertension: Secondary | ICD-10-CM | POA: Insufficient documentation

## 2011-02-21 DIAGNOSIS — Z01812 Encounter for preprocedural laboratory examination: Secondary | ICD-10-CM | POA: Insufficient documentation

## 2011-02-21 HISTORY — DX: Sleep apnea, unspecified: G47.30

## 2011-02-21 HISTORY — PX: ANAL EXAMINATION UNDER ANESTHESIA: SHX1138

## 2011-02-21 LAB — GLUCOSE, CAPILLARY: Glucose-Capillary: 185 mg/dL — ABNORMAL HIGH (ref 70–99)

## 2011-02-21 SURGERY — EXAM UNDER ANESTHESIA WITH ANAL FISTULECTOMY
Anesthesia: General | Wound class: Dirty or Infected

## 2011-02-21 MED ORDER — LACTATED RINGERS IV SOLN
INTRAVENOUS | Status: DC | PRN
  Administered 2011-02-21 (×2): via INTRAVENOUS

## 2011-02-21 MED ORDER — ONDANSETRON HCL 4 MG/2ML IJ SOLN
INTRAMUSCULAR | Status: DC | PRN
  Administered 2011-02-21: 4 mg via INTRAVENOUS

## 2011-02-21 MED ORDER — PHENYLEPHRINE HCL 10 MG/ML IJ SOLN
INTRAMUSCULAR | Status: DC | PRN
  Administered 2011-02-21 (×2): 80 ug via INTRAVENOUS

## 2011-02-21 MED ORDER — PROMETHAZINE HCL 25 MG/ML IJ SOLN: 6.2500 mg | INTRAMUSCULAR | Status: DC | PRN

## 2011-02-21 MED ORDER — PROPOFOL 10 MG/ML IV BOLUS
INTRAVENOUS | Status: DC | PRN
  Administered 2011-02-21: 200 mg via INTRAVENOUS
  Administered 2011-02-21: 50 mg via INTRAVENOUS

## 2011-02-21 MED ORDER — DIBUCAINE 1 % RE OINT
TOPICAL_OINTMENT | RECTAL | Status: DC | PRN
  Administered 2011-02-21: 1 via RECTAL

## 2011-02-21 MED ORDER — SUCCINYLCHOLINE CHLORIDE 20 MG/ML IJ SOLN
INTRAMUSCULAR | Status: DC | PRN
  Administered 2011-02-21: 100 mg via INTRAVENOUS

## 2011-02-21 MED ORDER — DIBUCAINE 1 % EX OINT: TOPICAL_OINTMENT | Freq: Three times a day (TID) | CUTANEOUS | Status: AC | PRN

## 2011-02-21 MED ORDER — MIDAZOLAM HCL 5 MG/5ML IJ SOLN
INTRAMUSCULAR | Status: DC | PRN
  Administered 2011-02-21: 2 mg via INTRAVENOUS

## 2011-02-21 MED ORDER — HYDROMORPHONE HCL PF 1 MG/ML IJ SOLN
INTRAMUSCULAR | Status: AC
  Filled 2011-02-21: qty 1

## 2011-02-21 MED ORDER — MEPERIDINE HCL 25 MG/ML IJ SOLN: 6.2500 mg | INTRAMUSCULAR | Status: DC | PRN

## 2011-02-21 MED ORDER — INSULIN ASPART 100 UNIT/ML ~~LOC~~ SOLN
4.0000 [IU] | Freq: Once | SUBCUTANEOUS | Status: AC
  Administered 2011-02-21: 4 [IU] via SUBCUTANEOUS

## 2011-02-21 MED ORDER — LACTATED RINGERS IV SOLN
INTRAVENOUS | Status: DC
  Administered 2011-02-21: 10:00:00 via INTRAVENOUS

## 2011-02-21 MED ORDER — SUFENTANIL CITRATE 50 MCG/ML IV SOLN
INTRAVENOUS | Status: DC | PRN
  Administered 2011-02-21: 10 ug via INTRAVENOUS
  Administered 2011-02-21: 15 ug via INTRAVENOUS

## 2011-02-21 MED ORDER — HYDROMORPHONE HCL PF 1 MG/ML IJ SOLN
0.2500 mg | INTRAMUSCULAR | Status: DC | PRN
  Administered 2011-02-21 (×2): 0.5 mg via INTRAVENOUS

## 2011-02-21 MED ORDER — BUPIVACAINE-EPINEPHRINE 0.25% -1:200000 IJ SOLN
INTRAMUSCULAR | Status: DC | PRN
  Administered 2011-02-21: 20 mL

## 2011-02-21 MED ORDER — HYDROCODONE-ACETAMINOPHEN 5-325 MG PO TABS: 1.0000 | ORAL_TABLET | Freq: Four times a day (QID) | ORAL | Status: AC | PRN

## 2011-02-21 SURGICAL SUPPLY — 43 items
BLADE SURG 15 STRL LF DISP TIS (BLADE) ×1 IMPLANT
BLADE SURG 15 STRL SS (BLADE) ×2
CANISTER SUCTION 2500CC (MISCELLANEOUS) ×2 IMPLANT
CLOTH BEACON ORANGE TIMEOUT ST (SAFETY) ×2 IMPLANT
COVER SURGICAL LIGHT HANDLE (MISCELLANEOUS) ×2 IMPLANT
DECANTER SPIKE VIAL GLASS SM (MISCELLANEOUS) ×2 IMPLANT
DRAPE PROXIMA HALF (DRAPES) ×2 IMPLANT
DRAPE UTILITY 15X26 W/TAPE STR (DRAPE) ×4 IMPLANT
ELECT CAUTERY BLADE 6.4 (BLADE) ×2 IMPLANT
ELECT REM PT RETURN 9FT ADLT (ELECTROSURGICAL) ×2
ELECTRODE REM PT RTRN 9FT ADLT (ELECTROSURGICAL) ×1 IMPLANT
GAUZE SPONGE 4X4 16PLY XRAY LF (GAUZE/BANDAGES/DRESSINGS) ×2 IMPLANT
GLOVE BIO SURGEON STRL SZ 6.5 (GLOVE) ×1 IMPLANT
GLOVE BIO SURGEON STRL SZ7.5 (GLOVE) ×4 IMPLANT
GLOVE BIOGEL PI IND STRL 6.5 (GLOVE) IMPLANT
GLOVE BIOGEL PI IND STRL 7.5 (GLOVE) IMPLANT
GLOVE BIOGEL PI INDICATOR 6.5 (GLOVE) ×1
GLOVE BIOGEL PI INDICATOR 7.5 (GLOVE) ×1
GOWN STRL NON-REIN LRG LVL3 (GOWN DISPOSABLE) ×4 IMPLANT
KIT BASIN OR (CUSTOM PROCEDURE TRAY) ×2 IMPLANT
KIT ROOM TURNOVER OR (KITS) ×2 IMPLANT
NDL HYPO 25GX1X1/2 BEV (NEEDLE) ×1 IMPLANT
NEEDLE HYPO 25GX1X1/2 BEV (NEEDLE) ×2 IMPLANT
NS IRRIG 1000ML POUR BTL (IV SOLUTION) ×2 IMPLANT
PACK LITHOTOMY IV (CUSTOM PROCEDURE TRAY) ×2 IMPLANT
PAD ARMBOARD 7.5X6 YLW CONV (MISCELLANEOUS) ×4 IMPLANT
PENCIL BUTTON HOLSTER BLD 10FT (ELECTRODE) ×2 IMPLANT
SPONGE GAUZE 4X4 12PLY (GAUZE/BANDAGES/DRESSINGS) ×2 IMPLANT
SPONGE SURGIFOAM ABS GEL 12-7 (HEMOSTASIS) ×1 IMPLANT
SURGILUBE 2OZ TUBE FLIPTOP (MISCELLANEOUS) ×2 IMPLANT
SUT CHROMIC 2 0 SH (SUTURE) ×2 IMPLANT
SUT SILK 0 TIES 10X30 (SUTURE) ×1 IMPLANT
SYR 20ML ECCENTRIC (SYRINGE) ×1 IMPLANT
SYR BULB 3OZ (MISCELLANEOUS) ×2 IMPLANT
SYR CONTROL 10ML LL (SYRINGE) ×2 IMPLANT
TAPE CLOTH SURG 6X10 WHT LF (GAUZE/BANDAGES/DRESSINGS) ×1 IMPLANT
TOWEL OR 17X24 6PK STRL BLUE (TOWEL DISPOSABLE) ×2 IMPLANT
TOWEL OR 17X26 10 PK STRL BLUE (TOWEL DISPOSABLE) ×2 IMPLANT
TRAY PROCTOSCOPIC FIBER OPTIC (SET/KITS/TRAYS/PACK) IMPLANT
TUBE CONNECTING 12X1/4 (SUCTIONS) ×2 IMPLANT
UNDERPAD 30X30 INCONTINENT (UNDERPADS AND DIAPERS) ×2 IMPLANT
WATER STERILE IRR 1000ML POUR (IV SOLUTION) IMPLANT
YANKAUER SUCT BULB TIP NO VENT (SUCTIONS) ×2 IMPLANT

## 2011-02-21 NOTE — H&P (Signed)
[name]  Description:  50 year old female  01/19/2011 4:30 PM Office Visit Provider:  Robyne Askew, MD  MRN: [id number] Department:  [facility]            Diagnoses  Reason for Visit    Anal fistula - Primary  Routine Post Op   565.1  reck buttock abscess           Vitals - Last Recorded       BP  Pulse  Temp(Src)  Resp  Ht  Wt    164/80  96  99.4 F (37.4 C) (Temporal)  16  5\' 3"  (1.6 m)  254 lb 3.2 oz (115.304 kg)           BMI               45.03 kg/m2                   Progress Notes     Robyne Askew, MD 01/19/2011 4:54 PM Signed    Subjective:    Patient ID: [name], female DOB: Aug 12, 1960, 50 y.o. MRN: [id number]  HPI  The patient is a 50 year old white female who is now a months out from excision of some hidradenitis from her right thigh as well as her left perirectal region. The right thigh area healed up very nicely. She's had persistent pain and drainage from the left perirectal region.  Review of Systems  Constitutional: Negative.  HENT: Negative.  Eyes: Negative.  Respiratory: Negative.  Cardiovascular: Negative.  Gastrointestinal: Positive for blood in stool and rectal pain.  Genitourinary: Negative.  Musculoskeletal: Negative.  Skin: Negative.  Neurological: Negative.  Hematological: Negative.  Psychiatric/Behavioral: Negative.      Objective:    Physical Exam  Constitutional: She is oriented to person, place, and time. She appears well-developed and well-nourished.  HENT:  Head: Normocephalic and atraumatic.  Eyes: Conjunctivae and EOM are normal. Pupils are equal, round, and reactive to light.  Neck: Neck supple.  Cardiovascular: Normal rate, regular rhythm and normal heart sounds.  Pulmonary/Chest: Effort normal and breath sounds normal.  Abdominal: Soft. Bowel sounds are normal.  Genitourinary:  The patient has been persistent open area in the left pararectal space. Today I can feel  a palpable cord running from this area to the rectum.  Musculoskeletal: Normal range of motion.  Neurological: She is alert and oriented to person, place, and time.  Skin: Skin is warm and dry.  Psychiatric: She has a normal mood and affect. Her behavior is normal.      Assessment:     Couple months postop from excision of some hidradenitis. I now think she has an anal fistula that may have been the source of some of the hidradenitis in the perirectal area     Plan:     At this point I then she probably needs an exam under anesthesia so that we can probe and proved that she has a fistula. She would then be a candidate for either a fistulotomy or placement of a seton. I've discussed in detail the risks and benefits of the surgery to do this as well as some of the technical aspects and she understands and wishes to proceed              Not recorded  Level of Service     PR OFFICE/OUTPT VISIT,EST,LEVL II [id number]           All Flowsheet Templates (all recorded)     Encounter Vitals Flowsheet   Custom Formula Data Flowsheet   Anthropometrics Flowsheet                           Referring Provider          Mickie Hillier            All Charges for This Encounter       Code  Description  Service Date  Service Provider  Modifiers  Quantity    [phone]  PR OFFICE/OUTPT VISIT,EST,LEVL II  01/19/2011  Robyne Askew, MD   1                Other Encounter Related Information     Allergies & Medications      Problem List      History      Patient-Entered Questionnaires       No data filed

## 2011-02-21 NOTE — Progress Notes (Signed)
All checklist data confirmed with Pt in holding.  

## 2011-02-21 NOTE — Op Note (Signed)
02/21/2011  12:56 PM  PATIENT:  [name]  50 y.o. female  PRE-OPERATIVE DIAGNOSIS:  anal fistula  POST-OPERATIVE DIAGNOSIS:  * No post-op diagnosis entered *  PROCEDURE:  Procedure(s): EXAM UNDER ANESTHESIA WITH ANAL FISTULECTOMY  SURGEON:  Surgeon(s): Caleen Essex III, MD  PHYSICIAN ASSISTANT:   ASSISTANTS: none   ANESTHESIA:   general  EBL:  Total I/O In: 1000 [I.V.:1000] Out: 10 [Blood:10]  BLOOD ADMINISTERED:none  DRAINS: none   LOCAL MEDICATIONS USED:  MARCAINE 20CC  SPECIMEN:  Excision  DISPOSITION OF SPECIMEN:  PATHOLOGY  COUNTS:  YES  TOURNIQUET:  * No tourniquets in log *  DICTATION: .Dragon Dictation after informed consent was obtained patient brought to the operating room placed in the supine position on the operating room table. After adequate induction of general anesthesia the patient was moved in the lithotomy position. her perirectal area was then prepped withBetadine and draped in usual sterile manner. The perirectal area was infiltrated with quarter percent Marcaine with epinephrine a bullet retractor was then placed in the rectum. A probe was used to probe the external opening to the left of the rectum. Using the probe we were able to demonstrate that the fistula track connected to the rectum anteriorly. The fistula track was curetted and then fulgurated with the cautery. A blue vesi-loopwas placed across the fistula tract so that one end of the loop and came out the rectum and the other out the external opening of the fistula tract. Tension was held on the 2 ends of the vesi-loop. A 0 silk tie was placed at the base of the vesi-loop in order to keep tension on the vesi-loop. The external opening of the fistula tract was excised sharply with the electrocautery. The wound was packed with a moistened 4 x 4 gauze. The dibucaine ointment and sterile dressings were applied. The patient tolerated the procedure well.at the end of the case all needle sponge  and instrument counts are correct. The patient was then awakened and taken to recovery in stable condition.  PLAN OF CARE: Discharge to home after PACU  PATIENT DISPOSITION:  PACU - hemodynamically stable.   Delay start of Pharmacological VTE agent (>24hrs) due to surgical blood loss or risk of bleeding:  {YES/NO/NOT APPLICABLE:20182

## 2011-02-21 NOTE — Anesthesia Preprocedure Evaluation (Addendum)
Anesthesia Evaluation  Patient identified by MRN, date of birth, ID band Patient awake    Reviewed: Allergy & Precautions, H&P , NPO status , Patient's Chart, lab work & pertinent test results  Airway Mallampati: II  Neck ROM: Full    Dental No notable dental hx. (+) Teeth Intact   Pulmonary  clear to auscultation  Pulmonary exam normal       Cardiovascular hypertension, On Medications and Pt. on medications neg cardio ROS Regular Normal    Neuro/Psych Negative Neurological ROS  Negative Psych ROS   GI/Hepatic   Endo/Other  Negative Endocrine ROSDiabetes mellitus-Morbid obesity  Renal/GU negative Renal ROS  Genitourinary negative   Musculoskeletal   Abdominal (+) obese,   Peds  Hematology negative hematology ROS (+)   Anesthesia Other Findings   Reproductive/Obstetrics negative OB ROS                          Anesthesia Physical Anesthesia Plan  ASA: III  Anesthesia Plan: General   Post-op Pain Management:    Induction: Intravenous  Airway Management Planned: Oral ETT  Additional Equipment:   Intra-op Plan:   Post-operative Plan: Extubation in OR  Informed Consent: I have reviewed the patients History and Physical, chart, labs and discussed the procedure including the risks, benefits and alternatives for the proposed anesthesia with the patient or authorized representative who has indicated his/her understanding and acceptance.     Plan Discussed with: CRNA  Anesthesia Plan Comments:         Anesthesia Quick Evaluation

## 2011-02-21 NOTE — Anesthesia Postprocedure Evaluation (Signed)
  Anesthesia Post-op Note  Patient: [name]  Procedure(s) Performed:  EXAM UNDER ANESTHESIA WITH ANAL FISTULECTOMY - with placement of seton  Patient Location: PACU  Anesthesia Type: General  Level of Consciousness: awake and alert   Airway and Oxygen Therapy: Patient Spontanous Breathing and Patient connected to nasal cannula oxygen  Post-op Pain: none  Post-op Assessment: Post-op Vital signs reviewed, Patient's Cardiovascular Status Stable, Respiratory Function Stable and Patent Airway  Post-op Vital Signs: Reviewed and stable  Complications: No apparent anesthesia complications

## 2011-02-21 NOTE — Preoperative (Signed)
Beta Blockers   Reason not to administer Beta Blockers:Not Applicable 

## 2011-02-21 NOTE — Transfer of Care (Signed)
Immediate Anesthesia Transfer of Care Note  Patient: [name]  Procedure(s) Performed:  EXAM UNDER ANESTHESIA WITH ANAL FISTULECTOMY - with placement of seton  Patient Location: PACU  Anesthesia Type: General  Level of Consciousness: awake and alert   Airway & Oxygen Therapy: Patient Spontanous Breathing and Patient connected to face mask oxygen  Post-op Assessment: Report given to PACU RN and Post -op Vital signs reviewed and stable  Post vital signs: Reviewed and stable  Complications: No apparent anesthesia complications

## 2011-02-21 NOTE — Interval H&P Note (Signed)
History and Physical Interval Note:  02/21/2011 11:44 AM  [name]  has presented today for surgery, with the diagnosis of anal fistula  The various methods of treatment have been discussed with the patient and family. After consideration of risks, benefits and other options for treatment, the patient has consented to  Procedure(s): EXAM UNDER ANESTHESIA WITH ANAL FISTULECTOMY as a surgical intervention .  The patients' history has been reviewed, patient examined, no change in status, stable for surgery.  I have reviewed the patients' chart and labs.  Questions were answered to the patient's satisfaction.     TOTH III,PAUL S

## 2011-02-22 ENCOUNTER — Telehealth (INDEPENDENT_AMBULATORY_CARE_PROVIDER_SITE_OTHER): Payer: Self-pay

## 2011-02-22 NOTE — Telephone Encounter (Signed)
Patient called and said she was prescribed hydrocodone every 6 hrs but that the medicine starts to wear of at about 3.5hr. I told her she could take hydrocodone every 4 hrs or try and add tylenol at 3 hr mark before she takes another hydrocodone.

## 2011-02-23 ENCOUNTER — Telehealth (INDEPENDENT_AMBULATORY_CARE_PROVIDER_SITE_OTHER): Payer: Self-pay

## 2011-02-23 NOTE — Telephone Encounter (Signed)
Patient called and said she woke up with painful leg spasms and they have continued all morning long. No other symptoms. Paged Dr Carolynne Edouard and sent him in basket message. When he responds patient will be given a return phone call.

## 2011-02-23 NOTE — Telephone Encounter (Signed)
She should probably call medical doctor. i don't think this is related to rectal surgery

## 2011-03-07 ENCOUNTER — Telehealth (INDEPENDENT_AMBULATORY_CARE_PROVIDER_SITE_OTHER): Payer: Self-pay | Admitting: General Surgery

## 2011-03-16 ENCOUNTER — Ambulatory Visit (INDEPENDENT_AMBULATORY_CARE_PROVIDER_SITE_OTHER): Payer: BC Managed Care – PPO | Admitting: General Surgery

## 2011-03-16 VITALS — BP 180/90 | HR 72 | Temp 98.4°F | Resp 14 | Ht 63.0 in | Wt 254.0 lb

## 2011-03-16 DIAGNOSIS — K603 Anal fistula: Secondary | ICD-10-CM

## 2011-03-16 MED ORDER — HYDROCODONE-ACETAMINOPHEN 5-325 MG PO TABS: 1.0000 | ORAL_TABLET | ORAL | Status: AC | PRN

## 2011-03-20 ENCOUNTER — Encounter (INDEPENDENT_AMBULATORY_CARE_PROVIDER_SITE_OTHER): Payer: Self-pay | Admitting: General Surgery

## 2011-03-20 NOTE — Progress Notes (Signed)
Subjective:     Patient ID: [name], female   DOB: 10/30/1960, 51 y.o.   MRN: [id number]  HPI The patient is a 51 year old white female who is now about a month out from placement of a seton around an anal fistula. She is gradually improving. She still has some soreness but it is much better. She also still has a little bit of mucousy drainage but this is also improving. She denies any fevers or chills.  Review of Systems     Objective:   Physical Exam On exam her perirectal wound is open but clean. The seton was in place. We were able to cinch it up a little tighter today with a 0 silk tie. She tolerated this well.    Assessment:     One month out from placement of a seton for an anal fistula    Plan:     At this point I have encouraged her to continue to keep the area clean and dry. We will plan to see her back in another month to check the seton.

## 2011-03-21 ENCOUNTER — Ambulatory Visit
Admission: RE | Admit: 2011-03-21 | Discharge: 2011-03-21 | Disposition: A | Discharge status: Home / Self Care | Payer: BC Managed Care – PPO | Source: Ambulatory Visit | Attending: Physician Assistant | Admitting: Physician Assistant

## 2011-03-21 ENCOUNTER — Other Ambulatory Visit: Payer: Self-pay | Admitting: Physician Assistant

## 2011-03-21 DIAGNOSIS — S0990XA Unspecified injury of head, initial encounter: Secondary | ICD-10-CM

## 2011-04-17 ENCOUNTER — Ambulatory Visit (INDEPENDENT_AMBULATORY_CARE_PROVIDER_SITE_OTHER): Payer: BC Managed Care – PPO | Admitting: General Surgery

## 2011-04-17 ENCOUNTER — Encounter (INDEPENDENT_AMBULATORY_CARE_PROVIDER_SITE_OTHER): Payer: Self-pay | Admitting: General Surgery

## 2011-04-17 VITALS — BP 130/80 | HR 80 | Temp 97.8°F | Resp 18 | Ht 63.0 in | Wt 254.4 lb

## 2011-04-17 DIAGNOSIS — K603 Anal fistula: Secondary | ICD-10-CM

## 2011-04-17 MED ORDER — HYDROCODONE-ACETAMINOPHEN 5-325 MG PO TABS: 1.0000 | ORAL_TABLET | ORAL | Status: AC | PRN

## 2011-04-17 NOTE — Patient Instructions (Signed)
Keep area clean and dry. °

## 2011-04-24 ENCOUNTER — Telehealth (INDEPENDENT_AMBULATORY_CARE_PROVIDER_SITE_OTHER): Payer: Self-pay | Admitting: General Surgery

## 2011-04-24 NOTE — Telephone Encounter (Signed)
MS Bjorn CALLED TO SAY HER SETON HAS WORKED ITS WAY OUT AND IS GONE. SHE DID HAVE SOME BLEEDING YESTERDAY BUT IT IS ONLY SPOTTING TODAY/ NO OTHER DRAINAGE NOTED OR DISCOMFORT/ IF BLEEDING STARTS AGAIN SHE WILL NOTIFY THE OFFICE/ SHE HAS A F/U APPT WITH DR. TOTH ON 05-22-11/GY

## 2011-04-26 NOTE — Progress Notes (Signed)
Subjective:     Patient ID: [name], female   DOB: Feb 13, 1961, 51 y.o.   MRN: [id number]  HPI The patient is a 51 year old female who is several months out from placement of a seton for an anal fistula. She is doing very well. She has minimal soreness at the area. She's had minimal drainage. Her bowels are moving normally.  Review of Systems     Objective:   Physical Exam On exam the perirectal wound is clean. The seton was in place. We were able to cinch the seton down so that there was a little more tension today. she tolerated this well.    Assessment:     Status post placement of a seton for an anal fistula    Plan:     She will continue to keep the area clean. We will see her back in one more month to check the seton

## 2011-05-21 ENCOUNTER — Encounter (INDEPENDENT_AMBULATORY_CARE_PROVIDER_SITE_OTHER): Payer: Self-pay | Admitting: General Surgery

## 2011-05-23 ENCOUNTER — Encounter (INDEPENDENT_AMBULATORY_CARE_PROVIDER_SITE_OTHER): Payer: Self-pay | Admitting: General Surgery

## 2011-05-23 ENCOUNTER — Ambulatory Visit (INDEPENDENT_AMBULATORY_CARE_PROVIDER_SITE_OTHER): Payer: BC Managed Care – PPO | Admitting: General Surgery

## 2011-05-23 VITALS — BP 159/70 | HR 107 | Temp 97.8°F | Ht 63.0 in | Wt 252.6 lb

## 2011-05-23 DIAGNOSIS — L732 Hidradenitis suppurativa: Secondary | ICD-10-CM

## 2011-05-23 NOTE — Progress Notes (Signed)
Subjective:     Patient ID: [name], female   DOB: 04/17/1960, 51 y.o.   MRN: [id number]  HPI The patient is a 51 year old female who has had significant hidradenitis in her perirectal region as well as an anal fistula. We treated the fistula with a seton. Since her last visit the seton has come out and she is doing very well. She has no rectal pain. She has good continence. She has had no more drainage. Her current problem his pain and some drainage in her right axilla. She denies any fevers or chills.  Review of Systems  Constitutional: Negative.   HENT: Negative.   Eyes: Negative.   Respiratory: Negative.   Cardiovascular: Negative.   Genitourinary: Negative.   Musculoskeletal: Negative.   Skin: Negative.   Neurological: Negative.   Hematological: Negative.   Psychiatric/Behavioral: Negative.        Objective:   Physical Exam  Constitutional: She is oriented to person, place, and time. She appears well-developed and well-nourished.  HENT:  Head: Normocephalic and atraumatic.  Eyes: Conjunctivae and EOM are normal. Pupils are equal, round, and reactive to light.  Neck: Normal range of motion. Neck supple.  Cardiovascular: Normal rate, regular rhythm and normal heart sounds.   Pulmonary/Chest: Effort normal and breath sounds normal.  Abdominal: Soft. Bowel sounds are normal.  Genitourinary:       Her perirectal region looks good. The wounds have completely healed. She has good rectal tone.  Musculoskeletal: Normal range of motion.       She has 3 areas of hidradenitis in the right axilla. No acute infection. She does have some mucousy drainage from the open wound.  Neurological: She is alert and oriented to person, place, and time.  Skin: Skin is warm and dry.  Psychiatric: She has a normal mood and affect. Her behavior is normal.       Assessment:     Hidradenitis in the right axilla    Plan:     Because of the chronic nature of the wound I think she would  be best served by having these areas completely excised back to healthy tissue. I discussed with her in detail the risks and benefits of the operation and dizziness as well as some of the technical aspects and she understands and wishes to proceed.

## 2011-08-03 ENCOUNTER — Other Ambulatory Visit (INDEPENDENT_AMBULATORY_CARE_PROVIDER_SITE_OTHER): Payer: Self-pay | Admitting: General Surgery

## 2011-08-03 DIAGNOSIS — L732 Hidradenitis suppurativa: Secondary | ICD-10-CM

## 2011-08-08 ENCOUNTER — Encounter (INDEPENDENT_AMBULATORY_CARE_PROVIDER_SITE_OTHER): Payer: Self-pay | Admitting: General Surgery

## 2011-08-13 ENCOUNTER — Encounter (INDEPENDENT_AMBULATORY_CARE_PROVIDER_SITE_OTHER): Payer: Self-pay | Admitting: General Surgery

## 2011-08-13 ENCOUNTER — Ambulatory Visit (INDEPENDENT_AMBULATORY_CARE_PROVIDER_SITE_OTHER): Payer: BC Managed Care – PPO | Admitting: General Surgery

## 2011-08-13 VITALS — BP 118/64 | HR 85 | Temp 98.4°F | Ht 63.0 in | Wt 255.4 lb

## 2011-08-13 DIAGNOSIS — L732 Hidradenitis suppurativa: Secondary | ICD-10-CM

## 2011-08-14 ENCOUNTER — Encounter (INDEPENDENT_AMBULATORY_CARE_PROVIDER_SITE_OTHER): Payer: Self-pay | Admitting: General Surgery

## 2011-08-14 ENCOUNTER — Ambulatory Visit (INDEPENDENT_AMBULATORY_CARE_PROVIDER_SITE_OTHER): Payer: BC Managed Care – PPO | Admitting: General Surgery

## 2011-08-14 VITALS — BP 118/77 | HR 74 | Temp 97.4°F | Resp 16 | Ht 63.0 in | Wt 254.0 lb

## 2011-08-14 DIAGNOSIS — T148XXA Other injury of unspecified body region, initial encounter: Secondary | ICD-10-CM

## 2011-08-14 NOTE — Patient Instructions (Signed)
Remove dressing, shower daily, then repack clean gauze into cavity and cover.

## 2011-08-14 NOTE — Progress Notes (Signed)
Subjective:     Patient ID: [name], female   DOB: 11-04-1960, 51 y.o.   MRN: [id number]  HPI The patient is a 51 year old white female who is a couple weeks out from excision of hidradenitis right axilla. We saw her yesterday and as we remove her stitches she had a large seroma was evacuated. She now has 2 small open wounds in the right axilla. We brought her in today for a dressing change. She states that she had not much drainage out last night  Review of Systems     Objective:   Physical Exam On exam the 2 open wounds in the right axilla are very clean. There is no sign of infection. We repacked the wound diet she tolerated this well.   Assessment:     Status post excision of hidradenitis from the right axilla now with 2 open wounds    Plan:     At this point I was like her to shower daily without any packing and the wounds and then change the dressing after the shower. We will have her come back Friday for dressing change and I will see her in about 2 weeks to check her progress

## 2011-08-14 NOTE — Progress Notes (Signed)
Subjective:     Patient ID: Ivan Anchors, female   DOB: 1960/07/19, 51 y.o.   MRN: [id number]  HPI The patient is a 51 year old white female who is about 2 weeks out from excision of hidradenitis from her right axilla. She has 3 incisions in the right axilla. The stitches appear to be pulling through to the skin. We removed the stitches today and a large seroma cavity was evacuated. The skin incision separated. She is having only some mild discomfort in the area.  Review of Systems     Objective:   Physical Exam On exam to the 3 wounds are now open. They appear to be clean. There is no sign of infection. The wounds were packed with gauze and she tolerated this well.    Assessment:     Status post excision of hidradenitis in the right axilla now with 2 open wounds    Plan:     We will plan to bring her back tomorrow for a dressing change. She does have a friend that can come to her house daily to repack the wounds.

## 2011-08-17 ENCOUNTER — Encounter (INDEPENDENT_AMBULATORY_CARE_PROVIDER_SITE_OTHER): Payer: BC Managed Care – PPO | Admitting: General Surgery

## 2011-08-17 ENCOUNTER — Ambulatory Visit (INDEPENDENT_AMBULATORY_CARE_PROVIDER_SITE_OTHER): Payer: BC Managed Care – PPO | Admitting: General Surgery

## 2011-08-17 ENCOUNTER — Encounter (INDEPENDENT_AMBULATORY_CARE_PROVIDER_SITE_OTHER): Payer: Self-pay

## 2011-08-17 VITALS — BP 138/86 | HR 88 | Temp 98.6°F | Resp 18 | Ht 63.0 in | Wt 255.6 lb

## 2011-08-17 DIAGNOSIS — IMO0001 Reserved for inherently not codable concepts without codable children: Secondary | ICD-10-CM

## 2011-08-17 DIAGNOSIS — Z4800 Encounter for change or removal of nonsurgical wound dressing: Secondary | ICD-10-CM

## 2011-08-27 ENCOUNTER — Ambulatory Visit (INDEPENDENT_AMBULATORY_CARE_PROVIDER_SITE_OTHER): Payer: BC Managed Care – PPO | Admitting: General Surgery

## 2011-08-27 ENCOUNTER — Encounter (INDEPENDENT_AMBULATORY_CARE_PROVIDER_SITE_OTHER): Payer: Self-pay | Admitting: General Surgery

## 2011-08-27 VITALS — BP 110/70 | HR 87 | Temp 97.6°F | Resp 16 | Ht 63.0 in | Wt 248.0 lb

## 2011-08-27 DIAGNOSIS — T148XXA Other injury of unspecified body region, initial encounter: Secondary | ICD-10-CM

## 2011-08-27 NOTE — Patient Instructions (Signed)
Continue showering and daily dressing changes

## 2011-08-30 ENCOUNTER — Encounter (INDEPENDENT_AMBULATORY_CARE_PROVIDER_SITE_OTHER): Payer: Self-pay | Admitting: General Surgery

## 2011-08-30 NOTE — Progress Notes (Signed)
Subjective:     Patient ID: [name], female   DOB: 08/29/1960, 51 y.o.   MRN: [id number]  HPI The patient is a 51 year old white female who is a few weeks out from excision of hidradenitis from the right axilla. Her course was complicated by the incisions opening up. She has been doing her dressing changes at home and this is been going well. She is not having much discharge. She still has some discomfort associated with the wound but this is slowly improving. She denies any fevers.  Review of Systems     Objective:   Physical Exam On exam the incisions in the right axilla are very clean with good granulation tissue. The wounds are getting very shallow.    Assessment:     Status post excision of hidradenitis from the right axilla now with open wounds    Plan:     At this point she is doing an excellent job taking care of the wounds. They are very clean. She will continue to care for the wounds she has been and we will see her back in about 2 or 3 weeks to check the wounds again.

## 2011-09-28 ENCOUNTER — Ambulatory Visit (INDEPENDENT_AMBULATORY_CARE_PROVIDER_SITE_OTHER): Payer: BC Managed Care – PPO | Admitting: General Surgery

## 2011-09-28 ENCOUNTER — Encounter (INDEPENDENT_AMBULATORY_CARE_PROVIDER_SITE_OTHER): Payer: Self-pay | Admitting: General Surgery

## 2011-09-28 VITALS — BP 120/62 | HR 91 | Temp 97.0°F | Ht 63.0 in | Wt 244.2 lb

## 2011-09-28 DIAGNOSIS — L732 Hidradenitis suppurativa: Secondary | ICD-10-CM

## 2011-09-28 NOTE — Progress Notes (Signed)
Subjective:     Patient ID: [name], female   DOB: 30-Mar-1960, 51 y.o.   MRN: [id number]  HPI The patient is a 51 year old white female who is a couple months out from excision of some hidradenitis right axilla. Unfortunately her wounds opened up and she is been doing dressing changes to those areas. She returns today and feels that the wounds have healed. She is very happy with how everything is turned out. She has no complaints today.  Review of Systems     Objective:   Physical Exam On exam her wounds in the right axilla have completely healed. There is no sign of infection. She has good range of motion of her shoulder.    Assessment:     Status post excision of hidradenitis right axilla    Plan:     At this point she can return to her normal activities without any restrictions. We will plan to see her back on a p.r.n. basis the

## 2011-09-28 NOTE — Patient Instructions (Signed)
May return to all normal activities 

## 2012-01-31 ENCOUNTER — Encounter (INDEPENDENT_AMBULATORY_CARE_PROVIDER_SITE_OTHER): Payer: BC Managed Care – PPO | Admitting: General Surgery

## 2012-06-23 ENCOUNTER — Ambulatory Visit (INDEPENDENT_AMBULATORY_CARE_PROVIDER_SITE_OTHER): Payer: BC Managed Care – PPO | Admitting: General Surgery

## 2012-07-11 ENCOUNTER — Other Ambulatory Visit (HOSPITAL_COMMUNITY)
Admission: RE | Admit: 2012-07-11 | Discharge: 2012-07-11 | Disposition: A | Discharge status: Home / Self Care | Payer: BC Managed Care – PPO | Source: Ambulatory Visit | Attending: Family Medicine | Admitting: Family Medicine

## 2012-07-11 ENCOUNTER — Other Ambulatory Visit: Payer: Self-pay | Admitting: Physician Assistant

## 2012-07-11 DIAGNOSIS — Z124 Encounter for screening for malignant neoplasm of cervix: Secondary | ICD-10-CM | POA: Insufficient documentation

## 2012-09-15 ENCOUNTER — Other Ambulatory Visit (HOSPITAL_COMMUNITY): Payer: Self-pay | Admitting: Physician Assistant

## 2012-09-15 DIAGNOSIS — Z1231 Encounter for screening mammogram for malignant neoplasm of breast: Secondary | ICD-10-CM

## 2012-09-18 ENCOUNTER — Ambulatory Visit (HOSPITAL_COMMUNITY)
Admission: RE | Admit: 2012-09-18 | Discharge: 2012-09-18 | Disposition: A | Discharge status: Home / Self Care | Payer: BC Managed Care – PPO | Source: Ambulatory Visit | Attending: Physician Assistant | Admitting: Physician Assistant

## 2012-09-18 DIAGNOSIS — Z1231 Encounter for screening mammogram for malignant neoplasm of breast: Secondary | ICD-10-CM

## 2012-10-27 ENCOUNTER — Encounter (HOSPITAL_COMMUNITY): Payer: Self-pay | Admitting: *Deleted

## 2012-10-27 ENCOUNTER — Emergency Department (HOSPITAL_COMMUNITY): Payer: BC Managed Care – PPO

## 2012-10-27 ENCOUNTER — Emergency Department (HOSPITAL_COMMUNITY)
Admission: EM | Admit: 2012-10-27 | Discharge: 2012-10-27 | Disposition: A | Discharge status: Home / Self Care | Payer: BC Managed Care – PPO | Attending: Emergency Medicine | Admitting: Emergency Medicine

## 2012-10-27 DIAGNOSIS — T8131XA Disruption of external operation (surgical) wound, not elsewhere classified, initial encounter: Secondary | ICD-10-CM

## 2012-10-27 DIAGNOSIS — S8010XA Contusion of unspecified lower leg, initial encounter: Secondary | ICD-10-CM | POA: Insufficient documentation

## 2012-10-27 DIAGNOSIS — Z3202 Encounter for pregnancy test, result negative: Secondary | ICD-10-CM | POA: Insufficient documentation

## 2012-10-27 DIAGNOSIS — Z8659 Personal history of other mental and behavioral disorders: Secondary | ICD-10-CM | POA: Insufficient documentation

## 2012-10-27 DIAGNOSIS — Y838 Other surgical procedures as the cause of abnormal reaction of the patient, or of later complication, without mention of misadventure at the time of the procedure: Secondary | ICD-10-CM | POA: Insufficient documentation

## 2012-10-27 DIAGNOSIS — I10 Essential (primary) hypertension: Secondary | ICD-10-CM | POA: Insufficient documentation

## 2012-10-27 DIAGNOSIS — Z79899 Other long term (current) drug therapy: Secondary | ICD-10-CM | POA: Insufficient documentation

## 2012-10-27 DIAGNOSIS — R296 Repeated falls: Secondary | ICD-10-CM | POA: Insufficient documentation

## 2012-10-27 DIAGNOSIS — E119 Type 2 diabetes mellitus without complications: Secondary | ICD-10-CM | POA: Insufficient documentation

## 2012-10-27 DIAGNOSIS — Y9239 Other specified sports and athletic area as the place of occurrence of the external cause: Secondary | ICD-10-CM | POA: Insufficient documentation

## 2012-10-27 DIAGNOSIS — Y939 Activity, unspecified: Secondary | ICD-10-CM | POA: Insufficient documentation

## 2012-10-27 DIAGNOSIS — L988 Other specified disorders of the skin and subcutaneous tissue: Secondary | ICD-10-CM | POA: Insufficient documentation

## 2012-10-27 DIAGNOSIS — Z872 Personal history of diseases of the skin and subcutaneous tissue: Secondary | ICD-10-CM | POA: Insufficient documentation

## 2012-10-27 DIAGNOSIS — Z8719 Personal history of other diseases of the digestive system: Secondary | ICD-10-CM | POA: Insufficient documentation

## 2012-10-27 DIAGNOSIS — D72829 Elevated white blood cell count, unspecified: Secondary | ICD-10-CM | POA: Insufficient documentation

## 2012-10-27 HISTORY — DX: Type 2 diabetes mellitus without complications: E11.9

## 2012-10-27 LAB — CBC WITH DIFFERENTIAL/PLATELET
Basophils Relative: 0 % (ref 0–1)
Eosinophils Relative: 2 % (ref 0–5)
HCT: 34.1 % — ABNORMAL LOW (ref 36.0–46.0)
Hemoglobin: 11 g/dL — ABNORMAL LOW (ref 12.0–15.0)
MCHC: 32.3 g/dL (ref 30.0–36.0)
MCV: 77.9 fL — ABNORMAL LOW (ref 78.0–100.0)
Monocytes Absolute: 1.3 10*3/uL — ABNORMAL HIGH (ref 0.1–1.0)
Monocytes Relative: 8 % (ref 3–12)
Neutro Abs: 10.2 10*3/uL — ABNORMAL HIGH (ref 1.7–7.7)

## 2012-10-27 LAB — COMPREHENSIVE METABOLIC PANEL
ALT: 10 U/L (ref 0–35)
AST: 11 U/L (ref 0–37)
CO2: 24 mEq/L (ref 19–32)
Chloride: 102 mEq/L (ref 96–112)
Creatinine, Ser: 1.15 mg/dL — ABNORMAL HIGH (ref 0.50–1.10)
GFR calc non Af Amer: 54 mL/min — ABNORMAL LOW (ref >90)
Total Bilirubin: 0.1 mg/dL — ABNORMAL LOW (ref 0.3–1.2)

## 2012-10-27 LAB — URINALYSIS, ROUTINE W REFLEX MICROSCOPIC
Glucose, UA: NEGATIVE mg/dL
Hgb urine dipstick: NEGATIVE
Ketones, ur: NEGATIVE mg/dL
pH: 5.5 (ref 5.0–8.0)

## 2012-10-27 LAB — URINE MICROSCOPIC-ADD ON

## 2012-10-27 MED ORDER — OXYCODONE-ACETAMINOPHEN 5-325 MG PO TABS: 1.0000 | ORAL_TABLET | Freq: Three times a day (TID) | ORAL | Status: DC | PRN

## 2012-10-27 MED ORDER — NYSTATIN 100000 UNIT/GM EX POWD: 1.0000 g | Freq: Two times a day (BID) | CUTANEOUS | Status: DC

## 2012-10-27 MED ORDER — SODIUM CHLORIDE 0.9 % IV BOLUS (SEPSIS)
1000.0000 mL | INTRAVENOUS | Status: AC
  Administered 2012-10-27: 1000 mL via INTRAVENOUS

## 2012-10-27 MED ORDER — DOXYCYCLINE HYCLATE 100 MG PO CAPS: 100.0000 mg | ORAL_CAPSULE | Freq: Two times a day (BID) | ORAL | Status: DC

## 2012-10-27 MED ORDER — IOHEXOL 300 MG/ML  SOLN
80.0000 mL | Freq: Once | INTRAMUSCULAR | Status: AC | PRN
  Administered 2012-10-27: 80 mL via INTRAVENOUS

## 2012-10-27 MED ORDER — OXYCODONE-ACETAMINOPHEN 5-325 MG PO TABS
1.0000 | ORAL_TABLET | Freq: Once | ORAL | Status: AC
  Administered 2012-10-27: 1 via ORAL
  Filled 2012-10-27: qty 1

## 2012-10-27 NOTE — ED Notes (Signed)
MD at bedside. 

## 2012-10-27 NOTE — ED Provider Notes (Signed)
CSN: [id number]     Arrival date & time 10/27/12  1747 History     First MD Initiated Contact with Patient 10/27/12 1755     Chief Complaint  Patient presents with  . Leg Pain  . Abscess   (Consider location/radiation/quality/duration/timing/severity/associated sxs/prior Treatment) Patient is a 52 y.o. female presenting with leg pain, abscess, and abdominal pain. The history is provided by the patient.  Leg Pain Lower extremity pain location: left shin. Time since incident:  18 days Pain details:    Quality:  Aching   Radiates to:  Does not radiate   Severity:  Moderate   Onset quality:  Sudden   Duration:  18 days   Timing:  Constant   Progression:  Unchanged Chronicity:  New Dislocation: no   Foreign body present:  No foreign bodies Prior injury to area:  No Relieved by:  Nothing Worsened by:  Nothing tried Associated symptoms: no back pain, no fatigue, no fever and no neck pain   Abscess Associated symptoms: no fatigue, no fever, no headaches, no nausea and no vomiting   Abdominal Pain Pain location: right groin. Pain quality: burning   Pain radiates to:  Does not radiate Pain severity:  Mild Onset quality:  Gradual Duration:  4 weeks Timing:  Constant Progression:  Unchanged Chronicity:  New Relieved by:  Nothing Worsened by:  Nothing tried Associated symptoms: no chest pain, no cough, no diarrhea, no dysuria, no fatigue, no fever, no hematuria, no nausea, no shortness of breath and no vomiting     Past Medical History  Diagnosis Date  . Abscess   . Sleep difficulties   . Fatigue   . Sinus problem   . Encounter for drainage of abscess     increased drainage from abscess on buttock  . Abscess of buttock   . Hypertension   . Anxiety   . Dysrhythmia     approx 3 years ago, increased heart rate,  . Depression     sees Dr. Sherlyn Lick  . Sleep apnea     2008- sleep study, neg. for sleep apnea   . Anal fistula   . Diabetes mellitus without complication     Past Surgical History  Procedure Laterality Date  . Knee arthroscopy      left  . Shoulder surgery  04/14/09    right  . Incise and drain abcess      abscess on right thigh and buttock  . Anal examination under anesthesia  02/21/11    anal fistula  . Breast surgery  patient does not remember date of procedure    pull fluid off lft br   Family History  Problem Relation Age of Onset  . Hypertension Mother   . Alzheimer's disease Mother   . Diabetes Father   . Anesthesia problems Neg Hx   . Hypotension Neg Hx   . Malignant hyperthermia Neg Hx   . Pseudochol deficiency Neg Hx    History  Substance Use Topics  . Smoking status: Never Smoker   . Smokeless tobacco: Never Used  . Alcohol Use: No   OB History   Grav Para Term Preterm Abortions TAB SAB Ect Mult Living   1 1  1            Review of Systems  Constitutional: Negative for fever and fatigue.  HENT: Negative for congestion, drooling and neck pain.   Eyes: Negative for pain.  Respiratory: Negative for cough and shortness of breath.   Cardiovascular:  Negative for chest pain.  Gastrointestinal: Positive for abdominal pain. Negative for nausea, vomiting and diarrhea.  Genitourinary: Negative for dysuria and hematuria.       Pain w/ bm's  Musculoskeletal: Negative for back pain and gait problem.  Skin: Negative for color change.  Neurological: Negative for dizziness and headaches.  Hematological: Negative for adenopathy.  Psychiatric/Behavioral: Negative for behavioral problems.  All other systems reviewed and are negative.    Allergies  Tramadol  Home Medications   Current Outpatient Rx  Name  Route  Sig  Dispense  Refill  . atorvastatin (LIPITOR) 20 MG tablet   Oral   Take 20 mg by mouth at bedtime.         Marland Kitchen lisinopril-hydrochlorothiazide (PRINZIDE,ZESTORETIC) 10-12.5 MG per tablet   Oral   Take 1 tablet by mouth daily.           . metFORMIN (GLUCOPHAGE) 500 MG tablet   Oral   Take 500 mg by  mouth 2 (two) times daily with a meal.          BP 108/59  Pulse 87  Temp(Src) 98.6 F (37 C)  Resp 16  SpO2 98% Physical Exam  Nursing note and vitals reviewed. Constitutional: She is oriented to person, place, and time. She appears well-developed and well-nourished.  HENT:  Head: Normocephalic.  Mouth/Throat: No oropharyngeal exudate.  Eyes: Conjunctivae and EOM are normal. Pupils are equal, round, and reactive to light.  Neck: Normal range of motion. Neck supple.  Cardiovascular: Normal rate, regular rhythm, normal heart sounds and intact distal pulses.  Exam reveals no gallop and no friction rub.   No murmur heard. Pulmonary/Chest: Effort normal and breath sounds normal. No respiratory distress. She has no wheezes.  Abdominal: Soft. Bowel sounds are normal. There is no tenderness. There is no rebound and no guarding.  Genitourinary:  Mild pain during rectal exam. No evidence of peri-rectal abscess.   Musculoskeletal: Normal range of motion. She exhibits no edema and no tenderness.  3cm and 2cm linear dehiscence of tissue in right groin. Wounds probed and appear superficial. No obvious purulent drainage. Mild foul odor. The skin in her inguinal folds is mildly macerated bilaterally. Mild ttp in this area.   Mild bruising to left anterior lower shin. Mild to mod ttp of left medial ankle and left medial distal tibia.   Neurological: She is alert and oriented to person, place, and time.  Skin: Skin is warm and dry.  Psychiatric: She has a normal mood and affect. Her behavior is normal.    ED Course   Procedures (including critical care time)  Labs Reviewed  CBC WITH DIFFERENTIAL - Abnormal; Notable for the following:    WBC 15.5 (*)    Hemoglobin 11.0 (*)    HCT 34.1 (*)    MCV 77.9 (*)    MCH 25.1 (*)    Platelets 405 (*)    Neutro Abs 10.2 (*)    Monocytes Absolute 1.3 (*)    All other components within normal limits  URINALYSIS, ROUTINE W REFLEX MICROSCOPIC -  Abnormal; Notable for the following:    Leukocytes, UA SMALL (*)    All other components within normal limits  COMPREHENSIVE METABOLIC PANEL - Abnormal; Notable for the following:    BUN 25 (*)    Creatinine, Ser 1.15 (*)    Total Bilirubin 0.1 (*)    GFR calc non Af Amer 54 (*)    GFR calc Af Amer 63 (*)  All other components within normal limits  URINE MICROSCOPIC-ADD ON - Abnormal; Notable for the following:    Bacteria, UA FEW (*)    All other components within normal limits  POCT PREGNANCY, URINE   Dg Tibia/fibula Left  10/27/2012   *RADIOLOGY REPORT*  Clinical Data: Leg pain.  Injury.  LEFT TIBIA AND FIBULA - 2 VIEW  Comparison: None.  Findings: No fracture or bone lesion.  The knee and ankle joints normally aligned.  There are mild degenerative changes at the knee.  The soft tissues are unremarkable.  IMPRESSION: No fracture or dislocation.   Original Report Authenticated By: Amie Portland, M.D.   Dg Ankle 2 Views Left  10/27/2012   *RADIOLOGY REPORT*  Clinical Data: Injury with left ankle pain  LEFT ANKLE - 2 VIEW  Comparison: None.  Findings: No fracture.  The ankle mortise is normally spaced and aligned.  There are dorsal plantar calcaneal spurs.  Mild soft tissue edema is noted diffusely.  IMPRESSION: No fracture or dislocation.   Original Report Authenticated By: Amie Portland, M.D.   Ct Abdomen Pelvis W Contrast  10/27/2012   *RADIOLOGY REPORT*  Clinical Data: Right groin lesion.  History of anal fistula. Diarrhea.  CT ABDOMEN AND PELVIS WITH CONTRAST  Technique:  Multidetector CT imaging of the abdomen and pelvis was performed following the standard protocol during bolus administration of intravenous contrast.  Contrast: 80mL OMNIPAQUE IOHEXOL 300 MG/ML  SOLN  Comparison: None.  Findings: Lung bases are clear.  No pericardial fluid.  No focal hepatic lesion.  The gallbladder, pancreas, spleen, and kidneys are normal.  Adrenal glands are mildly thickening like representing  hyperplasia.  There is mild stranding surrounding the left and right kidney with  no obstructing ureteral lesion identified.  The stomach, small bowel, appendix, and cecum are normal.  Colon rectosigmoid colon are normal.  Abdominal aorta normal caliber.  No retroperitoneal periportal lymphadenopathy.  No free fluid the pelvis.  Uterus is lobular with calcified lobulations.  Ovaries are normal.  The bladder is normal.  No evidence of anal fistula.  The most inferior anus is not imaged. There is no evidence of inflammation of the peritoneum although peroneum is not completely imaged.  No evidence of growing abscess.  IMPRESSION:  1.No acute abdominal or pelvic findings.  2.  Mild perinephric stranding.  3.  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  4.  No evidence of perianal abscess although the inferior anus is not imaged.   Original Report Authenticated By: Genevive Bi, M.D.   1. Maceration of skin   2. Dehiscence of incision, initial encounter   3. Leukocytosis     MDM  6:48 PM 52 y.o. female w hx of anal fistula who pw lesions to right groin x 1 mos. Pt notes purulent appearing drainage. Seen by Dr. Carolynne Edouard in 2012 for anal fistula repair. Pt now having pain w/ defecation and oozing lesions in right groin. AFVSS here. Will get labs, IVF, CT to r/o recurrent fistula. Pt also w/ fall on Aug 1, has bruising to left shin, will get plain film. Percocet for pain.   11:54 PM: Interpreted/reviewed labs/imaging. Imaging non-contrib. Labs show mild leukocytosis, mild elev in Cr.  Will leave wounds open to drain. Will tx pt w/ doxy to prevent skin infection, nystatin powder to prevent fungal infection. Educated on keeping skin folds clean and dry.  I have discussed the diagnosis/risks/treatment options with the patient and believe the pt to be eligible for discharge home to  follow-up with the wellness center tomorrow, she still needs to see Dr. Carolynne Edouard if she is unable to see a surgeon through the  wellness center. We also discussed returning to the ED immediately if new or worsening sx occur. We discussed the sx which are most concerning (e.g., redness, swelling, fever, vomiting) that necessitate immediate return. Any new prescriptions provided to the patient are listed below.  Discharge Medication List as of 10/27/2012 10:41 PM    START taking these medications   Details  doxycycline (VIBRAMYCIN) 100 MG capsule Take 1 capsule (100 mg total) by mouth 2 (two) times daily. One po bid x 7 days, Starting 10/27/2012, Until Discontinued, Print    nystatin (MYCOSTATIN/NYSTOP) 100000 UNIT/GM POWD Apply 1 g topically 2 (two) times daily., Starting 10/27/2012, Until Discontinued, Print    oxyCODONE-acetaminophen (PERCOCET) 5-325 MG per tablet Take 1 tablet by mouth every 8 (eight) hours as needed for pain., Starting 10/27/2012, Until Discontinued, Print          Junius Argyle, MD 10/28/12 [phone]

## 2012-10-27 NOTE — ED Notes (Signed)
Pt is here after falling 3 weeks ago and hurting left leg.  Pt has 2 abscesses to right groin.

## 2012-10-27 NOTE — ED Notes (Addendum)
Patient presents to the ED with complaint of lower left leg pain with a quarter size bruise on the shin area.  Patient states that [place] the beach [place]st and the pain has been present 7/10 since the fall. Patient also complains about 2 abscess on her inner groin on the right leg. She states that she has a history of abscesses and saw these 2 absesses appear about 2 months ago. Patient says that the two abscesses that have been draining pus and began draining green discharge Friday night. She indicates that she has been packing the wounds every day and that her pain is a 8/10 in this area. Patient resting in bed. Will continue to monitor.

## 2012-11-04 ENCOUNTER — Encounter (HOSPITAL_COMMUNITY): Payer: Self-pay | Admitting: *Deleted

## 2012-11-04 ENCOUNTER — Emergency Department (HOSPITAL_COMMUNITY): Payer: BC Managed Care – PPO

## 2012-11-04 ENCOUNTER — Emergency Department (HOSPITAL_COMMUNITY)
Admission: EM | Admit: 2012-11-04 | Discharge: 2012-11-04 | Disposition: A | Discharge status: Home / Self Care | Payer: BC Managed Care – PPO | Attending: Emergency Medicine | Admitting: Emergency Medicine

## 2012-11-04 DIAGNOSIS — M79609 Pain in unspecified limb: Secondary | ICD-10-CM | POA: Insufficient documentation

## 2012-11-04 DIAGNOSIS — Z8659 Personal history of other mental and behavioral disorders: Secondary | ICD-10-CM | POA: Insufficient documentation

## 2012-11-04 DIAGNOSIS — Z79899 Other long term (current) drug therapy: Secondary | ICD-10-CM | POA: Insufficient documentation

## 2012-11-04 DIAGNOSIS — Z872 Personal history of diseases of the skin and subcutaneous tissue: Secondary | ICD-10-CM | POA: Insufficient documentation

## 2012-11-04 DIAGNOSIS — R609 Edema, unspecified: Secondary | ICD-10-CM

## 2012-11-04 DIAGNOSIS — Z8719 Personal history of other diseases of the digestive system: Secondary | ICD-10-CM | POA: Insufficient documentation

## 2012-11-04 DIAGNOSIS — M7989 Other specified soft tissue disorders: Secondary | ICD-10-CM | POA: Insufficient documentation

## 2012-11-04 DIAGNOSIS — L539 Erythematous condition, unspecified: Secondary | ICD-10-CM | POA: Insufficient documentation

## 2012-11-04 DIAGNOSIS — IMO0001 Reserved for inherently not codable concepts without codable children: Secondary | ICD-10-CM | POA: Insufficient documentation

## 2012-11-04 DIAGNOSIS — I10 Essential (primary) hypertension: Secondary | ICD-10-CM | POA: Insufficient documentation

## 2012-11-04 DIAGNOSIS — E119 Type 2 diabetes mellitus without complications: Secondary | ICD-10-CM | POA: Insufficient documentation

## 2012-11-04 DIAGNOSIS — M79605 Pain in left leg: Secondary | ICD-10-CM

## 2012-11-04 LAB — CBC WITH DIFFERENTIAL/PLATELET
Basophils Absolute: 0.1 10*3/uL (ref 0.0–0.1)
Basophils Relative: 0 % (ref 0–1)
Eosinophils Absolute: 0.3 10*3/uL (ref 0.0–0.7)
Eosinophils Relative: 2 % (ref 0–5)
HCT: 31.2 % — ABNORMAL LOW (ref 36.0–46.0)
Hemoglobin: 10.3 g/dL — ABNORMAL LOW (ref 12.0–15.0)
Lymphocytes Relative: 28 % (ref 12–46)
MCH: 25.5 pg — ABNORMAL LOW (ref 26.0–34.0)
MCHC: 33 g/dL (ref 30.0–36.0)
MCV: 77.2 fL — ABNORMAL LOW (ref 78.0–100.0)
Monocytes Absolute: 0.6 10*3/uL (ref 0.1–1.0)
Platelets: 361 10*3/uL (ref 150–400)
RBC: 4.04 MIL/uL (ref 3.87–5.11)
RDW: 14.6 % (ref 11.5–15.5)

## 2012-11-04 LAB — COMPREHENSIVE METABOLIC PANEL
ALT: 16 U/L (ref 0–35)
AST: 21 U/L (ref 0–37)
CO2: 24 mEq/L (ref 19–32)
Calcium: 9 mg/dL (ref 8.4–10.5)
Creatinine, Ser: 1.22 mg/dL — ABNORMAL HIGH (ref 0.50–1.10)
GFR calc non Af Amer: 50 mL/min — ABNORMAL LOW (ref >90)
Sodium: 137 mEq/L (ref 135–145)
Total Protein: 6.7 g/dL (ref 6.0–8.3)

## 2012-11-04 LAB — GLUCOSE, CAPILLARY

## 2012-11-04 MED ORDER — HYDROCODONE-ACETAMINOPHEN 5-325 MG PO TABS: 1.0000 | ORAL_TABLET | ORAL | Status: DC | PRN

## 2012-11-04 MED ORDER — CEPHALEXIN 500 MG PO CAPS: 500.0000 mg | ORAL_CAPSULE | Freq: Four times a day (QID) | ORAL | Status: DC

## 2012-11-04 MED ORDER — GADOBENATE DIMEGLUMINE 529 MG/ML IV SOLN
20.0000 mL | Freq: Once | INTRAVENOUS | Status: AC
  Administered 2012-11-04: 20 mL via INTRAVENOUS

## 2012-11-04 MED ORDER — ENOXAPARIN SODIUM 120 MG/0.8ML ~~LOC~~ SOLN
1.0000 mg/kg | Freq: Once | SUBCUTANEOUS | Status: AC
  Administered 2012-11-04: 110 mg via SUBCUTANEOUS
  Filled 2012-11-04: qty 0.8

## 2012-11-04 NOTE — ED Notes (Signed)
Pt reports being seen here last week for a fall and bruising/pain to left lower leg. Still having pain and increase in swelling and redness going up her leg. Ambulatory at triage.

## 2012-11-04 NOTE — ED Provider Notes (Signed)
CSN: [id number]     Arrival date & time 11/04/12  1652 History   First MD Initiated Contact with Patient 11/04/12 1928     Chief Complaint  Patient presents with  . Leg Pain   (Consider location/radiation/quality/duration/timing/severity/associated sxs/prior Treatment) The history is provided by the patient and medical records. No language interpreter was used.    [name] is a 52 y.o. female  with a hx of abscess, HTN, depression, DM presents to the Emergency Department complaining of gradual, persistent, progressively worsening swelling of the Left lower leg with associated erythema and pain.   Pt with fall on 10/10/12 and was seen on 10/27/12.  She states the bruising was getting better an the pain was better with the pain medications.  After she finished the pain medications she began to have more pain and the redness began 4 days ago. She also reports at that time she could "feel knots" and then the swelling got worse.  No hx of DVT.  Pt also reports that she finished her doxycycline. Pt has been using ice and elevate which has not helped.  Nothing makes it better or worse.  Pt denies fever, chills, headache, neck pain, chest pain, SOB, hemoptysis, abd pain, N/V/D, weakness, dizziness, numbness, tingling.     Past Medical History  Diagnosis Date  . Abscess   . Sleep difficulties   . Fatigue   . Sinus problem   . Encounter for drainage of abscess     increased drainage from abscess on buttock  . Abscess of buttock   . Hypertension   . Anxiety   . Dysrhythmia     approx 3 years ago, increased heart rate,  . Depression     sees Dr. Sherlyn Lick  . Sleep apnea     2008- sleep study, neg. for sleep apnea   . Anal fistula   . Diabetes mellitus without complication    Past Surgical History  Procedure Laterality Date  . Knee arthroscopy      left  . Shoulder surgery  04/14/09    right  . Incise and drain abcess      abscess on right thigh and buttock  . Anal examination under  anesthesia  02/21/11    anal fistula  . Breast surgery  patient does not remember date of procedure    pull fluid off lft br   Family History  Problem Relation Age of Onset  . Hypertension Mother   . Alzheimer's disease Mother   . Diabetes Father   . Anesthesia problems Neg Hx   . Hypotension Neg Hx   . Malignant hyperthermia Neg Hx   . Pseudochol deficiency Neg Hx    History  Substance Use Topics  . Smoking status: Never Smoker   . Smokeless tobacco: Never Used  . Alcohol Use: No   OB History   Grav Para Term Preterm Abortions TAB SAB Ect Mult Living   1 1  1            Review of Systems  Constitutional: Negative for fever, diaphoresis, appetite change, fatigue and unexpected weight change.  HENT: Negative for mouth sores and neck stiffness.   Eyes: Negative for visual disturbance.  Respiratory: Negative for cough, chest tightness, shortness of breath and wheezing.   Cardiovascular: Negative for chest pain.  Gastrointestinal: Negative for nausea, vomiting, abdominal pain, diarrhea and constipation.  Endocrine: Negative for polydipsia, polyphagia and polyuria.  Genitourinary: Negative for dysuria, urgency, frequency and hematuria.  Musculoskeletal: Positive for  myalgias. Negative for back pain.  Skin: Positive for color change. Negative for rash.  Allergic/Immunologic: Negative for immunocompromised state.  Neurological: Negative for syncope, light-headedness and headaches.  Hematological: Does not bruise/bleed easily.  Psychiatric/Behavioral: Negative for sleep disturbance. The patient is not nervous/anxious.     Allergies  Tramadol  Home Medications   Current Outpatient Rx  Name  Route  Sig  Dispense  Refill  . atorvastatin (LIPITOR) 20 MG tablet   Oral   Take 20 mg by mouth at bedtime.         Marland Kitchen lisinopril-hydrochlorothiazide (PRINZIDE,ZESTORETIC) 10-12.5 MG per tablet   Oral   Take 1 tablet by mouth daily.           . metFORMIN (GLUCOPHAGE) 500 MG  tablet   Oral   Take 500 mg by mouth 2 (two) times daily with a meal.         . cephALEXin (KEFLEX) 500 MG capsule   Oral   Take 1 capsule (500 mg total) by mouth 4 (four) times daily.   40 capsule   0   . HYDROcodone-acetaminophen (NORCO/VICODIN) 5-325 MG per tablet   Oral   Take 1 tablet by mouth every 4 (four) hours as needed for pain.   11 tablet   0    BP 117/69  Pulse 77  Temp(Src) 97.9 F (36.6 C) (Oral)  Resp 20  Wt 244 lb 4.3 oz (110.8 kg)  BMI 43.28 kg/m2  SpO2 98%  LMP 10/02/2012 Physical Exam  Nursing note and vitals reviewed. Constitutional: She is oriented to person, place, and time. She appears well-developed and well-nourished. No distress.  Awake, alert, nontoxic appearance  HENT:  Head: Normocephalic and atraumatic.  Mouth/Throat: Oropharynx is clear and moist. No oropharyngeal exudate.  Eyes: Conjunctivae and EOM are normal. Pupils are equal, round, and reactive to light. No scleral icterus.  Neck: Normal range of motion. Neck supple.  Cardiovascular: Normal rate, regular rhythm, normal heart sounds and intact distal pulses.   No murmur heard. No tachycardia  Pulmonary/Chest: Effort normal and breath sounds normal. No respiratory distress. She has no wheezes. She has no rales.  No tachypnea No wheezing, rhonchi or rails  Abdominal: Soft. Bowel sounds are normal. She exhibits no distension. There is no tenderness. There is no rebound.  Musculoskeletal: Normal range of motion. She exhibits edema and tenderness.  Tenderness to palpation of the left calf Positive Homans sign No palpable cord Edema extending from the lower calf to the mid forefoot Tenderness palpation of the erythematous area  Lymphadenopathy:    She has no cervical adenopathy.  Neurological: She is alert and oriented to person, place, and time. She exhibits normal muscle tone. Coordination normal.  Speech is clear and goal oriented Moves extremities without ataxia  Skin: Skin is  warm and dry. She is not diaphoretic. There is erythema.  Left lower leg with 6 x 6 area of mild erythema without induration, evidence of abscess or evidence of cellulitis.  Psychiatric: She has a normal mood and affect. Her behavior is normal.    ED Course  Procedures (including critical care time) Labs Review Labs Reviewed  CBC WITH DIFFERENTIAL - Abnormal; Notable for the following:    WBC 12.2 (*)    Hemoglobin 10.3 (*)    HCT 31.2 (*)    MCV 77.2 (*)    MCH 25.5 (*)    Neutro Abs 7.9 (*)    All other components within normal limits  COMPREHENSIVE METABOLIC PANEL -  Abnormal; Notable for the following:    BUN 24 (*)    Creatinine, Ser 1.22 (*)    Albumin 3.4 (*)    Total Bilirubin 0.2 (*)    GFR calc non Af Amer 50 (*)    GFR calc Af Amer 58 (*)    All other components within normal limits  SEDIMENTATION RATE - Abnormal; Notable for the following:    Sed Rate 30 (*)    All other components within normal limits  GLUCOSE, CAPILLARY  C-REACTIVE PROTEIN   Imaging Review No results found.  MDM   1. Leg pain, anterior, left   2. Peripheral edema     [name] presents with no erythema and swelling of the left lower leg. History and physical consistent with a thrombophlebitis however patient with tenderness to left cath and positive Homans sign. No palpable cord.  Concern for possible DVT therefore we will dose with Lovenox and set up for an outpatient Doppler. Patient also with history of diabetes therefore will DC home with Keflex to prevent cellulitis this is an early case.  Alert oriented, nontoxic, nonseptic appearing, afebrile, not cardiac, NAD.  11:37 PM MRI Preliminary report without evidence of osteomyelitis or drainable abscess. He did have subtle muscular edema tracking along the fascial planes, but this is not in the area for pain.  Dr. Manus Gunning discussed with Dr. Eulah Pont of Ortho and feels as if this is an incidental finding and she is safe to go home with  Keflex.  Discussed with the patient and she is comfortable with this. We'll also discharge her with pain medication. I have also discussed reasons to return immediately to the ER.  Patient expresses understanding and agrees with plan.  Dr. Manus Gunning was consulted, evaluated this patient with me and agrees with the plan.       Dahlia Client Suliman Termini, PA-C 11/04/12 [phone]

## 2012-11-05 ENCOUNTER — Ambulatory Visit (HOSPITAL_COMMUNITY)
Admission: RE | Admit: 2012-11-05 | Discharge: 2012-11-05 | Disposition: A | Discharge status: Home / Self Care | Payer: BC Managed Care – PPO | Source: Ambulatory Visit | Attending: Emergency Medicine | Admitting: Emergency Medicine

## 2012-11-05 DIAGNOSIS — M79609 Pain in unspecified limb: Secondary | ICD-10-CM | POA: Insufficient documentation

## 2012-11-05 DIAGNOSIS — M7989 Other specified soft tissue disorders: Secondary | ICD-10-CM

## 2012-11-05 LAB — C-REACTIVE PROTEIN: CRP: <0.5 mg/dL — ABNORMAL LOW (ref <0.60)

## 2012-11-05 NOTE — ED Provider Notes (Signed)
Medical screening examination/treatment/procedure(s) were conducted as a shared visit with non-physician practitioner(s) and myself.  I personally evaluated the patient during the encounter  Pain and swelling to LLE, bumped on piece of wood 3 weeks ago. No fever. Small area of erythema to LLE. No induration.  Intact DP and PT pulse. MRI to evaluate for possible fasciitis.  Results d/w Dr. Eulah Pont who feels this is incidental finding, area of suble edema tracking is not in area of wound. No streaking erythema, blistering or overlying skin changes suggestive of fasciitis.  Will treat for cellulitis.  Glynn Octave, MD 11/05/12 [phone]

## 2012-11-05 NOTE — Progress Notes (Signed)
VASCULAR LAB PRELIMINARY  PRELIMINARY  PRELIMINARY  PRELIMINARY  Left lower extremity venous duplex completed.    Patient seen in the ED 11-04-12 for left foot and ankle swelling.  Symptoms since fall 10-10-12.  Sent to the vascular lab 11-05-12 for venous duplex.   Preliminary report:  Left:  No evidence of DVT, superficial thrombosis, or Baker's cyst.  [name], RVT 11/05/2012, 4:37 PM

## 2012-11-12 ENCOUNTER — Ambulatory Visit: Payer: BC Managed Care – PPO | Attending: Family Medicine

## 2013-03-08 ENCOUNTER — Encounter (HOSPITAL_COMMUNITY): Payer: Self-pay | Admitting: Emergency Medicine

## 2013-03-08 ENCOUNTER — Emergency Department (INDEPENDENT_AMBULATORY_CARE_PROVIDER_SITE_OTHER)
Admission: EM | Admit: 2013-03-08 | Discharge: 2013-03-08 | Disposition: A | Discharge status: Home / Self Care | Payer: BC Managed Care – PPO | Source: Home / Self Care | Attending: Emergency Medicine | Admitting: Emergency Medicine

## 2013-03-08 ENCOUNTER — Emergency Department (INDEPENDENT_AMBULATORY_CARE_PROVIDER_SITE_OTHER): Payer: BC Managed Care – PPO

## 2013-03-08 DIAGNOSIS — J111 Influenza due to unidentified influenza virus with other respiratory manifestations: Secondary | ICD-10-CM

## 2013-03-08 MED ORDER — ALBUTEROL SULFATE HFA 108 (90 BASE) MCG/ACT IN AERS: 2.0000 | INHALATION_SPRAY | Freq: Four times a day (QID) | RESPIRATORY_TRACT | Status: DC

## 2013-03-08 MED ORDER — BENZONATATE 200 MG PO CAPS: 200.0000 mg | ORAL_CAPSULE | Freq: Three times a day (TID) | ORAL | Status: DC | PRN

## 2013-03-08 MED ORDER — HYDROCODONE-ACETAMINOPHEN 5-325 MG PO TABS
2.0000 | ORAL_TABLET | Freq: Once | ORAL | Status: AC
  Administered 2013-03-08: 2 via ORAL

## 2013-03-08 MED ORDER — HYDROCODONE-ACETAMINOPHEN 5-325 MG PO TABS: ORAL_TABLET | ORAL | Status: DC

## 2013-03-08 MED ORDER — HYDROCODONE-ACETAMINOPHEN 5-325 MG PO TABS
ORAL_TABLET | ORAL | Status: AC
  Filled 2013-03-08: qty 2

## 2013-03-08 MED ORDER — CEFDINIR 300 MG PO CAPS: 300.0000 mg | ORAL_CAPSULE | Freq: Two times a day (BID) | ORAL | Status: DC

## 2013-03-08 NOTE — ED Notes (Signed)
Instructed to put on gown 

## 2013-03-08 NOTE — ED Notes (Signed)
C/o head and chest congestion, onset Tuesday, reports fever?104?Marland Kitchen  Productive cough, thick yellow phlegm.  Patient reports some wheezing

## 2013-03-08 NOTE — ED Provider Notes (Signed)
Chief Complaint   Chief Complaint  Patient presents with  . 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 She also had nasal congestion with yellowish, bloody drainage, headache, and sinus pressure. She had a temperature 104 3 or 4 days ago but now this has gone down. She also has had generalized achiness, chills, and sweats. The patient states she's had no appetite and some nausea but no vomiting or diarrhea. She did get the flu vaccine this year. She's had no definite sick exposures, however obviously, she is exposed to many sick children.  Review of Systems   Other than as noted above, the patient denies any of the following symptoms: Systemic:  No fevers, chills, sweats, or myalgias. Eye:  No redness or discharge. ENT:  No ear pain, headache, nasal congestion, drainage, sinus pressure, or sore throat. Neck:  No neck pain, stiffness, or swollen glands. Lungs:  No cough, sputum production, hemoptysis, wheezing, chest tightness, shortness of breath or chest pain. GI:  No abdominal pain, nausea, vomiting or diarrhea.  PMFSH   Past medical history, family history, social history, meds, and allergies were reviewed. Current meds include fluoxetine, Zantac, Lipitor, and lisinopril/HCTZ, and metformin. She has diabetes, hypertension, and elevated cholesterol. She is intolerant to tramadol.  Physical exam   Vital signs:  BP 132/88  Pulse 84  Temp(Src) 98.2 F (36.8 C) (Oral)  Resp 20  SpO2 99%  LMP 02/27/2013 General:  Alert and oriented.  In no distress.  Skin warm and dry. Eye:  No conjunctival injection or drainage. Lids were normal. ENT:  TMs and canals were normal, without erythema or inflammation.  Nasal mucosa was clear and uncongested, without drainage.  Mucous membranes were moist.  Pharynx was clear with no exudate  or drainage.  There were no oral ulcerations or lesions. Neck:  Supple, no adenopathy, tenderness or mass. Lungs:  No respiratory distress.  Lungs were clear to auscultation, without wheezes, rales or rhonchi.  Breath sounds were clear and equal bilaterally.  Heart:  Regular rhythm, without gallops, murmers or rubs. Skin:  Clear, warm, and dry, without rash or lesions.  Radiology   Dg Chest 2 View  03/08/2013   CLINICAL DATA:  Chest pain and tightness.  EXAM: CHEST  2 VIEW  COMPARISON:  PA and lateral chest 09/27/2010.  FINDINGS: Lungs are clear. Heart size is normal. No pneumothorax or pleural effusion. Hiatal hernia is noted.  IMPRESSION: No acute disease.  Hiatal hernia.   Electronically Signed   By: Drusilla Kanner M.D.   On: 03/08/2013 14:11   Course in [facility]   She was given Norco 5/325 2 by mouth for her generalized aches and did get considerable improvement.   Assessment     The encounter diagnosis was Influenza-like illness.  Suspect also secondary bronchitis and sinusitis.   Plan    1.  Meds:  The following meds were prescribed:   New Prescriptions   ALBUTEROL (PROVENTIL HFA;VENTOLIN HFA) 108 (90 BASE) MCG/ACT INHALER    Inhale 2 puffs into the lungs 4 (four) times daily.   BENZONATATE (TESSALON) 200 MG CAPSULE    Take 1 capsule (200 mg total) by mouth 3 (three) times daily as needed for cough.   CEFDINIR (OMNICEF) 300 MG CAPSULE    Take 1 capsule (300 mg total) by mouth 2 (  two) times daily.   HYDROCODONE-ACETAMINOPHEN (NORCO/VICODIN) 5-325 MG PER TABLET    1 to 2 tabs every 4 to 6 hours as needed for pain.    2.  Patient Education/Counseling:  The patient was given appropriate handouts, self care instructions, and instructed in symptomatic relief.  Instructed to get extra fluids, rest, and use a cool mist vaporizer.   3.  Follow up:  The patient was told to follow up here if no better in 3 to 4 days, or sooner if becoming worse in any way, and given some  red flag symptoms such as increasing fever, difficulty breathing, chest pain, or persistent vomiting which would prompt immediate return.  Follow up here as needed.      Reuben Likes, MD 03/08/13 [phone]

## 2013-03-08 NOTE — ED Notes (Signed)
Patient transported to X-ray 

## 2013-08-28 ENCOUNTER — Other Ambulatory Visit: Payer: Self-pay | Admitting: Physician Assistant

## 2013-08-28 ENCOUNTER — Other Ambulatory Visit (HOSPITAL_COMMUNITY)
Admission: RE | Admit: 2013-08-28 | Discharge: 2013-08-28 | Disposition: A | Discharge status: Home / Self Care | Payer: BC Managed Care – PPO | Source: Ambulatory Visit | Attending: Family Medicine | Admitting: Family Medicine

## 2013-08-28 DIAGNOSIS — Z124 Encounter for screening for malignant neoplasm of cervix: Secondary | ICD-10-CM | POA: Insufficient documentation

## 2013-08-31 ENCOUNTER — Other Ambulatory Visit (HOSPITAL_COMMUNITY): Payer: Self-pay | Admitting: Physician Assistant

## 2013-08-31 DIAGNOSIS — Z1231 Encounter for screening mammogram for malignant neoplasm of breast: Secondary | ICD-10-CM

## 2013-09-01 LAB — CYTOLOGY - PAP

## 2013-10-08 ENCOUNTER — Ambulatory Visit (HOSPITAL_COMMUNITY): Payer: BC Managed Care – PPO

## 2013-10-09 ENCOUNTER — Ambulatory Visit (HOSPITAL_COMMUNITY)
Admission: RE | Admit: 2013-10-09 | Discharge: 2013-10-09 | Disposition: A | Discharge status: Home / Self Care | Payer: BC Managed Care – PPO | Source: Ambulatory Visit | Attending: Physician Assistant | Admitting: Physician Assistant

## 2013-10-09 ENCOUNTER — Encounter (INDEPENDENT_AMBULATORY_CARE_PROVIDER_SITE_OTHER): Payer: Self-pay

## 2013-10-09 ENCOUNTER — Other Ambulatory Visit (HOSPITAL_COMMUNITY): Payer: Self-pay | Admitting: Physician Assistant

## 2013-10-09 DIAGNOSIS — Z1231 Encounter for screening mammogram for malignant neoplasm of breast: Secondary | ICD-10-CM | POA: Insufficient documentation

## 2013-10-26 ENCOUNTER — Ambulatory Visit (HOSPITAL_COMMUNITY): Payer: BC Managed Care – PPO

## 2014-01-11 ENCOUNTER — Encounter (HOSPITAL_COMMUNITY): Payer: Self-pay | Admitting: Emergency Medicine

## 2014-05-20 ENCOUNTER — Ambulatory Visit: Payer: BC Managed Care – PPO | Attending: Internal Medicine | Admitting: Internal Medicine

## 2014-05-20 ENCOUNTER — Encounter: Payer: Self-pay | Admitting: Internal Medicine

## 2014-05-20 VITALS — BP 120/76 | HR 92 | Temp 98.9°F | Resp 16 | Ht 63.0 in | Wt 258.0 lb

## 2014-05-20 DIAGNOSIS — E1142 Type 2 diabetes mellitus with diabetic polyneuropathy: Secondary | ICD-10-CM | POA: Diagnosis not present

## 2014-05-20 DIAGNOSIS — K219 Gastro-esophageal reflux disease without esophagitis: Secondary | ICD-10-CM | POA: Insufficient documentation

## 2014-05-20 DIAGNOSIS — IMO0002 Reserved for concepts with insufficient information to code with codable children: Secondary | ICD-10-CM | POA: Insufficient documentation

## 2014-05-20 DIAGNOSIS — E1165 Type 2 diabetes mellitus with hyperglycemia: Secondary | ICD-10-CM | POA: Insufficient documentation

## 2014-05-20 DIAGNOSIS — F419 Anxiety disorder, unspecified: Secondary | ICD-10-CM | POA: Insufficient documentation

## 2014-05-20 DIAGNOSIS — I10 Essential (primary) hypertension: Secondary | ICD-10-CM

## 2014-05-20 DIAGNOSIS — E118 Type 2 diabetes mellitus with unspecified complications: Secondary | ICD-10-CM

## 2014-05-20 LAB — COMPLETE METABOLIC PANEL WITHOUT GFR
ALT: 10 U/L (ref 0–35)
AST: 10 U/L (ref 0–37)
Albumin: 3.8 g/dL (ref 3.5–5.2)
Alkaline Phosphatase: 73 U/L (ref 39–117)
BUN: 12 mg/dL (ref 6–23)
CO2: 26 meq/L (ref 19–32)
Calcium: 9.1 mg/dL (ref 8.4–10.5)
Chloride: 103 meq/L (ref 96–112)
Creat: 1.22 mg/dL — ABNORMAL HIGH (ref 0.50–1.10)
GFR, Est African American: 58 mL/min — ABNORMAL LOW
GFR, Est Non African American: 51 mL/min — ABNORMAL LOW
Glucose, Bld: 227 mg/dL — ABNORMAL HIGH (ref 70–99)
Potassium: 5 meq/L (ref 3.5–5.3)
Sodium: 138 meq/L (ref 135–145)
Total Bilirubin: 0.4 mg/dL (ref 0.2–1.2)
Total Protein: 6.7 g/dL (ref 6.0–8.3)

## 2014-05-20 LAB — POCT GLYCOSYLATED HEMOGLOBIN (HGB A1C): Hemoglobin A1C: 7.9

## 2014-05-20 LAB — CBC
HCT: 29.3 % — ABNORMAL LOW (ref 36.0–46.0)
HEMOGLOBIN: 8.6 g/dL — AB (ref 12.0–15.0)
MCH: 19.3 pg — AB (ref 26.0–34.0)
MCHC: 29.4 g/dL — ABNORMAL LOW (ref 30.0–36.0)
MCV: 65.8 fL — ABNORMAL LOW (ref 78.0–100.0)
MPV: 10.5 fL (ref 8.6–12.4)
PLATELETS: 447 10*3/uL — AB (ref 150–400)
RBC: 4.45 MIL/uL (ref 3.87–5.11)
RDW: 19.2 % — ABNORMAL HIGH (ref 11.5–15.5)
WBC: 9.7 10*3/uL (ref 4.0–10.5)

## 2014-05-20 LAB — POCT URINALYSIS DIPSTICK
Bilirubin, UA: NEGATIVE
Blood, UA: NEGATIVE
Glucose, UA: 500
Ketones, UA: NEGATIVE
Leukocytes, UA: NEGATIVE
Nitrite, UA: NEGATIVE
Protein, UA: NEGATIVE
Spec Grav, UA: 1.01
Urobilinogen, UA: 0.2
pH, UA: 6.5

## 2014-05-20 LAB — GLUCOSE, POCT (MANUAL RESULT ENTRY)
POC Glucose: 307 mg/dl — AB (ref 70–99)
POC Glucose: 241 mg/dL — AB (ref 70–99)

## 2014-05-20 LAB — TSH: TSH: 0.287 u[IU]/mL — ABNORMAL LOW (ref 0.350–4.500)

## 2014-05-20 MED ORDER — GLIPIZIDE 5 MG PO TABS: 5.0000 mg | ORAL_TABLET | Freq: Two times a day (BID) | ORAL | Status: DC

## 2014-05-20 MED ORDER — INSULIN ASPART 100 UNIT/ML ~~LOC~~ SOLN
10.0000 [IU] | Freq: Once | SUBCUTANEOUS | Status: AC
  Administered 2014-05-20: 10 [IU] via SUBCUTANEOUS

## 2014-05-20 MED ORDER — DIAZEPAM 2 MG PO TABS: 2.0000 mg | ORAL_TABLET | Freq: Two times a day (BID) | ORAL | Status: DC | PRN

## 2014-05-20 MED ORDER — OMEPRAZOLE 20 MG PO CPDR: 20.0000 mg | DELAYED_RELEASE_CAPSULE | Freq: Every day | ORAL | Status: DC

## 2014-05-20 NOTE — Progress Notes (Signed)
Establish care Hx DM Complaining lower abdominal pain, vomiting  No burning with urination, strong odor

## 2014-05-20 NOTE — Progress Notes (Signed)
Patient ID: [name], female   DOB: [birth year], 54 y.o.   MRN: [id number]  MEQ:[id number]  WLN:[id number]  DOB - 1960-10-01  CC:  Chief Complaint  Patient presents with  . Establish Care  . Diabetes       HPI: [name] is a 54 y.o. female here today to establish medical care.Patient has a past medical history of T2DM, HTN, depression, and anxiety.  She is currently on Levemir 45 units at night and Humalog 5 units at dinner. She states that was on Januvia but states that she had side effects of joint pain, URI and quite the medication. She states that she stop Metformin (thought it was causing tingling in her hands and feet). She currently reports occasional numbness and tingling of BU/BLE. She was told by previous doctor that she had Kidney failure.  She states that her Levemir is $120.  She checks her BS daily in the AM ranging in 250-425 fasting since she discontinued Januvia.   She reports lower abdominal pain after eating and feels like her food is caught in her epigastric region. She has been told she has a hiatal hernia. She has heartburns at night, cough, choking, vomiting. Has been on Zantac for one year.     Patient has No headache, No chest pain, No abdominal pain - No Nausea, No new weakness tingling or numbness, No Cough - SOB.  Allergies  Allergen Reactions  . Tramadol Nausea Only   Past Medical History  Diagnosis Date  . Abscess   . Sleep difficulties   . Fatigue   . Sinus problem   . Encounter for drainage of abscess     increased drainage from abscess on buttock  . Abscess of buttock   . Hypertension   . Anxiety   . Dysrhythmia     approx 3 years ago, increased heart rate,  . Depression     sees [name]  . Sleep apnea     2008- sleep study, neg. for sleep apnea   . Anal fistula   . Diabetes mellitus without complication    Current Outpatient Prescriptions on File Prior to Visit  Medication Sig Dispense Refill  . atorvastatin  (LIPITOR) 20 MG tablet Take 20 mg by mouth at bedtime.    . FLUOXETINE HCL PO Take by mouth.    Marland Kitchen lisinopril-hydrochlorothiazide (PRINZIDE,ZESTORETIC) 10-12.5 MG per tablet Take 1 tablet by mouth daily.      . ranitidine (ZANTAC) 150 MG tablet Take 150 mg by mouth 2 (two) times daily.    Marland Kitchen albuterol (PROVENTIL HFA;VENTOLIN HFA) 108 (90 BASE) MCG/ACT inhaler Inhale 2 puffs into the lungs 4 (four) times daily. (Patient not taking: Reported on 05/20/2014) 1 Inhaler 0  . benzonatate (TESSALON) 200 MG capsule Take 1 capsule (200 mg total) by mouth 3 (three) times daily as needed for cough. (Patient not taking: Reported on 05/20/2014) 30 capsule 0  . cefdinir (OMNICEF) 300 MG capsule Take 1 capsule (300 mg total) by mouth 2 (two) times daily. (Patient not taking: Reported on 05/20/2014) 20 capsule 0  . cephALEXin (KEFLEX) 500 MG capsule Take 1 capsule (500 mg total) by mouth 4 (four) times daily. (Patient not taking: Reported on 05/20/2014) 40 capsule 0  . HYDROcodone-acetaminophen (NORCO/VICODIN) 5-325 MG per tablet Take 1 tablet by mouth every 4 (four) hours as needed for pain. (Patient not taking: Reported on 05/20/2014) 11 tablet 0  . HYDROcodone-acetaminophen (NORCO/VICODIN) 5-325 MG per tablet 1 to 2 tabs every 4 to 6  hours as needed for pain. (Patient not taking: Reported on 05/20/2014) 20 tablet 0  . metFORMIN (GLUCOPHAGE) 500 MG tablet Take 500 mg by mouth 2 (two) times daily with a meal.     No current facility-administered medications on file prior to visit.   Family History  Problem Relation Age of Onset  . Hypertension Mother   . Alzheimer's disease Mother   . Diabetes Father   . Anesthesia problems Neg Hx   . Hypotension Neg Hx   . Malignant hyperthermia Neg Hx   . Pseudochol deficiency Neg Hx    History   Social History  . Marital Status: Divorced    Spouse Name: N/A  . Number of Children: N/A  . Years of Education: N/A   Occupational History  . Not on file.   Social History  Main Topics  . Smoking status: Never Smoker   . Smokeless tobacco: Never Used  . Alcohol Use: No  . Drug Use: No  . Sexual Activity: Yes   Other Topics Concern  . Not on file   Social History Narrative    Review of Systems: Constitutional: Negative for fever, chills, diaphoresis, activity change, appetite change and fatigue. HENT: Negative for ear pain, nosebleeds, congestion, facial swelling, rhinorrhea, neck pain, neck stiffness and ear discharge.  Eyes: Negative for pain, discharge, redness, itching and visual disturbance. Respiratory: Negative for cough, choking, chest tightness, shortness of breath, wheezing and stridor.  Cardiovascular: Negative for chest pain, palpitations and leg swelling. Gastrointestinal: Negative for abdominal distention. Genitourinary: Negative for dysuria, urgency, frequency, hematuria, flank pain, decreased urine volume, difficulty urinating and dyspareunia.  Musculoskeletal: Negative for back pain, joint swelling, arthralgia and gait problem. Neurological: Negative for dizziness, tremors, seizures, syncope, facial asymmetry, speech difficulty, weakness, light-headedness, numbness and headaches.  Hematological: Negative for adenopathy. Does not bruise/bleed easily. Psychiatric/Behavioral: Negative for hallucinations, behavioral problems, confusion, dysphoric mood, decreased concentration and agitation.    Objective:   Filed Vitals:   05/20/14 1009  BP: 120/76  Pulse: 92  Temp: 98.9 F (37.2 C)  Resp: 16    Physical Exam: Constitutional: Patient appears well-developed and well-nourished. No distress. HENT: Normocephalic, atraumatic, External right and left ear normal. Oropharynx is clear and moist.  Eyes: Conjunctivae and EOM are normal. PERRLA, no scleral icterus. Neck: Normal ROM. Neck supple. No JVD. No tracheal deviation. No thyromegaly. CVS: RRR, S1/S2 +, no murmurs, no gallops, no carotid bruit.  Pulmonary: Effort and breath sounds  normal, no stridor, rhonchi, wheezes, rales.  Abdominal: Soft. BS +, no distension, tenderness, rebound or guarding.  Musculoskeletal: Normal range of motion. No edema and no tenderness.  Lymphadenopathy: No lymphadenopathy noted, cervical, inguinal or axillary Neuro: Alert. Normal reflexes, muscle tone coordination. No cranial nerve deficit. Skin: Skin is warm and dry. No rash noted. Not diaphoretic. No erythema. No pallor. Psychiatric: Normal mood and affect. Behavior, judgment, thought content normal.  Lab Results  Component Value Date   WBC 12.2* 11/04/2012   HGB 10.3* 11/04/2012   HCT 31.2* 11/04/2012   MCV 77.2* 11/04/2012   PLT 361 11/04/2012   Lab Results  Component Value Date   CREATININE 1.22* 11/04/2012   BUN 24* 11/04/2012   NA 137 11/04/2012   K 4.7 11/04/2012   CL 103 11/04/2012   CO2 24 11/04/2012    Lab Results  Component Value Date   HGBA1C 7.90 05/20/2014   Lipid Panel  No results found for: CHOL, TRIG, HDL, CHOLHDL, VLDL, LDLCALC     Assessment  and plan:   Janin was seen today for establish care and diabetes.  Diagnoses and all orders for this visit:  Type 2 diabetes mellitus with diabetic polyneuropathy Orders: -     POCT A1C -     POCT glucose (manual entry) -     Lipid Panel -     POCT urinalysis dipstick -     POCT glucose (manual entry) -     Microalbumin, urine -     insulin aspart (novoLOG) injection 10 Units; Inject 0.1 mLs (10 Units total) into the skin once. -     Begin glipiZIDE (GLUCOTROL) 5 MG tablet; Take 1 tablet (5 mg total) by mouth 2 (two) times daily before a meal. -     Amb Referral to Nutrition and Diabetic E Discontinued Januvia and switched to glipizide.  Essential hypertension Orders: -     CBC -     COMPLETE METABOLIC PANEL WITH GFR Patient blood pressure is stable and may continue on current medication.  Education on diet, exercise, and modifiable risk factors discussed. Will obtain appropriate labs as needed.  Will follow up in 3-6 months.   Morbid obesity Orders: -     TSH  Anxiety Orders: -     diazepam (VALIUM) 2 MG tablet; Take 1 tablet (2 mg total) by mouth every 12 (twelve) hours as needed for anxiety.---will begin weaning process now, nex month may go down to once daily and then move to every other day until weaned off medication. -     Ambulatory referral to Psychiatry--for medication review and benzo weaning  Patient has been on diazepam for 3 years and is on prozac. Has never had counseling or psychiatrist.   Gastroesophageal reflux disease, esophagitis presence not specified Orders: -     omeprazole (PRILOSEC) 20 MG capsule; Take 1 capsule (20 mg total) by mouth daily.   Return in about 3 weeks (around 06/10/2014) for Nurse Visit-CBg review and 3 mo PCP. If cbg is not improved I will change Humalog to a sliding scale for TID use.     Chari Manning, NP-C [facility] [phone] 05/20/2014, 10:31 AM

## 2014-05-20 NOTE — Patient Instructions (Addendum)
I have began you on a new diabetes medication called Glipizide. You will take this tablet once with breakfast and once with dinner. This should give you better control.  I have sent a referral for you to go to psychiatry so that we can begin to wean you off diazepam. I have lowered the dose of the new prescription, you may still take it twice per day as needed.  Please continue your prozac for depression as well.   I have stopped your ranitidine/zantac and switched you to omeprazole/prilosec for better control of your acid reflux.    Please get a new meter and begin checking your blood sugars. I will like for you to see my nurse in 3 weeks so that we can see how your blood sugars have changed with the new medication. If you have any concerns before then you may call my nurse Rachel Bo at [phone] and leave him a message for me. It was a pleasure to meet you today. Bring your blood sugar log back when you come see nurse.   I have also sent referral for diabetes education courses to see if we can help with your diet

## 2014-05-21 LAB — MICROALBUMIN, URINE: MICROALB UR: 3.5 mg/dL — AB (ref <2.0)

## 2014-05-25 ENCOUNTER — Telehealth: Payer: Self-pay | Admitting: Internal Medicine

## 2014-05-25 NOTE — Telephone Encounter (Signed)
Patient called to request blood work results, please f/u with pt.

## 2014-05-26 ENCOUNTER — Telehealth: Payer: Self-pay | Admitting: *Deleted

## 2014-05-26 NOTE — Telephone Encounter (Signed)
Pt called concerned about her fluctuating BS. She said that she did make an appointment for next Friday. After addressing the doctor about this pt's situation she thought that she could walk in tomorrow to see the nurse so that we can get better control of her BS.

## 2014-05-31 ENCOUNTER — Telehealth: Payer: Self-pay | Admitting: Internal Medicine

## 2014-05-31 ENCOUNTER — Ambulatory Visit: Payer: BC Managed Care – PPO | Admitting: Internal Medicine

## 2014-05-31 NOTE — Telephone Encounter (Signed)
Patient was called to reschedule appointment for this afternoon; PCP is out of the office

## 2014-06-03 ENCOUNTER — Encounter: Payer: Self-pay | Admitting: Internal Medicine

## 2014-06-03 ENCOUNTER — Ambulatory Visit: Payer: BC Managed Care – PPO | Attending: Internal Medicine | Admitting: Internal Medicine

## 2014-06-03 VITALS — BP 122/64 | HR 85 | Temp 98.0°F | Resp 16 | Ht 63.0 in | Wt 263.0 lb

## 2014-06-03 DIAGNOSIS — Z794 Long term (current) use of insulin: Secondary | ICD-10-CM | POA: Diagnosis not present

## 2014-06-03 DIAGNOSIS — E1165 Type 2 diabetes mellitus with hyperglycemia: Secondary | ICD-10-CM

## 2014-06-03 DIAGNOSIS — D649 Anemia, unspecified: Secondary | ICD-10-CM | POA: Insufficient documentation

## 2014-06-03 LAB — GLUCOSE, POCT (MANUAL RESULT ENTRY): POC Glucose: 168 mg/dl — AB (ref 70–99)

## 2014-06-03 MED ORDER — INSULIN ASPART 100 UNIT/ML ~~LOC~~ SOLN: SUBCUTANEOUS | Status: DC

## 2014-06-03 MED ORDER — ACETAMINOPHEN-CODEINE #3 300-30 MG PO TABS: 1.0000 | ORAL_TABLET | Freq: Two times a day (BID) | ORAL | Status: DC | PRN

## 2014-06-03 MED ORDER — FERROUS SULFATE 325 (65 FE) MG PO TABS: 325.0000 mg | ORAL_TABLET | Freq: Two times a day (BID) | ORAL | Status: DC

## 2014-06-03 NOTE — Progress Notes (Signed)
Patient ID: [name], female   DOB: 1960-06-20, 54 y.o.   MRN: [id number]  CC: DM f/u  HPI: [name] is a 54 y.o. female here today for a follow up visit.  Patient has past medical history of T2DM, HTN, and depression.  Patient was seen in clinic 14 days ago for medication changes for diabetes. She was continued on 45 units of Levemir and glipizide 5 mg BID. Patient presents with glucometer that reveals fasting AM sugars of 143-249, lunch sugars of 151-369, dinner levels of 68-169.  Patient reports that she is afraid because her father passed away at age 96 of complication relating to diabetes.   Patient has No headache, No chest pain, No abdominal pain - No Nausea, No new weakness tingling or numbness, No Cough - SOB.  Allergies  Allergen Reactions  . Tramadol Nausea Only    Current Outpatient Prescriptions on File Prior to Visit  Medication Sig Dispense Refill  . atorvastatin (LIPITOR) 20 MG tablet Take 20 mg by mouth at bedtime.    . diazepam (VALIUM) 2 MG tablet Take 1 tablet (2 mg total) by mouth every 12 (twelve) hours as needed for anxiety. 60 tablet 1  . FLUOXETINE HCL PO Take 40 mg by mouth.     Marland Kitchen glipiZIDE (GLUCOTROL) 5 MG tablet Take 1 tablet (5 mg total) by mouth 2 (two) times daily before a meal. 60 tablet 3  . insulin detemir (LEVEMIR) 100 UNIT/ML injection Inject 45 Units into the skin at bedtime.    . insulin lispro (HUMALOG) 100 UNIT/ML injection Inject 5 Units into the skin once.    Marland Kitchen lisinopril-hydrochlorothiazide (PRINZIDE,ZESTORETIC) 10-12.5 MG per tablet Take 1 tablet by mouth daily.      Marland Kitchen omeprazole (PRILOSEC) 20 MG capsule Take 1 capsule (20 mg total) by mouth daily. 30 capsule 3   No current facility-administered medications on file prior to visit.   Family History  Problem Relation Age of Onset  . Hypertension Mother   . Alzheimer's disease Mother   . Diabetes Father   . Anesthesia problems Neg Hx   . Hypotension Neg Hx   . Malignant  hyperthermia Neg Hx   . Pseudochol deficiency Neg Hx    History   Social History  . Marital Status: Divorced    Spouse Name: N/A  . Number of Children: N/A  . Years of Education: N/A   Occupational History  . Not on file.   Social History Main Topics  . Smoking status: Never Smoker   . Smokeless tobacco: Never Used  . Alcohol Use: No  . Drug Use: No  . Sexual Activity: Yes   Other Topics Concern  . Not on file   Social History Narrative    Review of Systems: See HPI  Objective:   Filed Vitals:   06/03/14 1653  BP: 122/64  Pulse: 85  Temp: 98 F (36.7 C)  Resp: 16    Physical Exam  Constitutional: She is oriented to person, place, and time.  Cardiovascular: Normal rate, regular rhythm and normal heart sounds.   Pulses:      Dorsalis pedis pulses are 2+ on the right side, and 2+ on the left side.       Posterior tibial pulses are 2+ on the right side, and 2+ on the left side.  Pulmonary/Chest: Effort normal and breath sounds normal.  Feet:  Right Foot:  Skin Integrity: Negative for skin breakdown.  Left Foot:  Skin Integrity: Negative for skin breakdown.  Neurological: She is alert and oriented to person, place, and time.  Skin: Skin is warm and dry.     Lab Results  Component Value Date   WBC 9.7 05/20/2014   HGB 8.6* 05/20/2014   HCT 29.3* 05/20/2014   MCV 65.8* 05/20/2014   PLT 447* 05/20/2014   Lab Results  Component Value Date   CREATININE 1.22* 05/20/2014   BUN 12 05/20/2014   NA 138 05/20/2014   K 5.0 05/20/2014   CL 103 05/20/2014   CO2 26 05/20/2014    Lab Results  Component Value Date   HGBA1C 7.90 05/20/2014   Lipid Panel  No results found for: CHOL, TRIG, HDL, CHOLHDL, VLDL, LDLCALC     Assessment and plan:   [name] was seen today for diabetes.  Diagnoses and all orders for this visit:  Type 2 diabetes mellitus with hyperglycemia Orders: -     Glucose (CBG) -     insulin aspart (NOVOLOG) 100 UNIT/ML injection;  Check blood sugar three times per day before meals If 150-200, give 3 units 201-250, give 5 units 251-300, give 7 units 301-350, give 9 units 351-400, give 11 units If greater than 400, call clinic I have changed patient from Humalog % units with meals to Novolog sliding scale before each meal  Anemia, unspecified anemia type Orders: -     ferrous sulfate 325 (65 FE) MG tablet; Take 1 tablet (325 mg total) by mouth 2 (two) times daily with a meal. Iron deficiency anemia will begin on iron  Return in about 2 days (around 06/05/2014) for 2-3 weeks with RN-cbg log.       Chari Manning, NP-C [facility] [phone] 06/03/2014, 5:08 PM

## 2014-06-03 NOTE — Progress Notes (Signed)
Patient states her "sugars are all over the place" She states her sugar is very rarely on the "normal" range of 70-120 She states her sugar can be as high as 400 She is concerned because her father passed from complications of diabetes at age 54 She is asking for a review of her kidney function

## 2014-06-04 ENCOUNTER — Ambulatory Visit: Payer: BC Managed Care – PPO | Admitting: Internal Medicine

## 2014-06-07 ENCOUNTER — Telehealth: Payer: Self-pay | Admitting: Internal Medicine

## 2014-06-07 NOTE — Telephone Encounter (Signed)
Pt called requesting letter stating where patient should not have been on metformin and januvia  due to chronic kidney diease. Please f\u with pt

## 2014-06-08 ENCOUNTER — Ambulatory Visit: Payer: BC Managed Care – PPO | Admitting: Internal Medicine

## 2014-06-18 ENCOUNTER — Telehealth: Payer: Self-pay | Admitting: General Practice

## 2014-06-18 NOTE — Telephone Encounter (Signed)
Patient calling to request a letter stating why two og her medications were [place]. Please see previous note.

## 2014-06-21 NOTE — Telephone Encounter (Signed)
Pt calling to follow upon requested letter explaining change in medication, please see previous note.  Pt needs letter by Wednesday 06/23/14

## 2014-06-21 NOTE — Telephone Encounter (Signed)
Patient was discontinued off [place] for increased creatinine and Januvia due to cost of medication. May write simple note for patient and stamp. Thanks

## 2014-06-23 ENCOUNTER — Encounter: Payer: BC Managed Care – PPO | Attending: Internal Medicine | Admitting: Skilled Nursing Facility1

## 2014-06-23 ENCOUNTER — Encounter: Payer: Self-pay | Admitting: Skilled Nursing Facility1

## 2014-06-23 VITALS — Ht 63.0 in | Wt 263.0 lb

## 2014-06-23 DIAGNOSIS — Z713 Dietary counseling and surveillance: Secondary | ICD-10-CM | POA: Diagnosis not present

## 2014-06-23 DIAGNOSIS — Z794 Long term (current) use of insulin: Secondary | ICD-10-CM | POA: Diagnosis not present

## 2014-06-23 DIAGNOSIS — E119 Type 2 diabetes mellitus without complications: Secondary | ICD-10-CM | POA: Diagnosis present

## 2014-06-23 NOTE — Progress Notes (Signed)
Diabetes Self-Management Education  Visit Type:    Appt. Start Time: 3:00 Appt. End Time: 4:20  06/23/2014  [name], identified by name and date of birth, is a 54 y.o. female with a diagnosis of Diabetes: Type 2.  Other people present during visit:      ASSESSMENT  Height 5\' 3"  (1.6 m), weight 263 lb (119.296 kg), last menstrual period 11/10/2013. Body mass index is 46.6 kg/(m^2).  Pts labs: anemia indicators all low, creatinine 1.22, GFR 58, TSH .287, microalb, ur 3.5. Pt states she only gets 4 hours of sleep due to her concerns with her mothers death.  Pt will see her doctor tomorrow to discuss her medication in regards to her hypoglycemia occuring often. Pt is concerned with continueing on her kidney failure track but this may not be enough of a motivation for significant change. Pt states she eats [place] food at least 2 times a week. Pt has bilateral edema on lower leg and top of feet. Initial Visit Information:  Are you currently following a meal plan?: No   Are you taking your medications as prescribed?: Yes Are you checking your feet?: No   How often do you need to have someone help you when you read instructions, pamphlets, or other written materials from your doctor or pharmacy?: 1 - Never    Psychosocial:     Patient Belief/Attitude about Diabetes: Motivated to manage diabetes Self-care barriers: None Self-management support: Friends, Family Patient Concerns: Nutrition/Meal planning, Healthy Lifestyle, Glycemic Control Special Needs: None Preferred Learning Style: Auditory Learning Readiness: Ready  Complications:   Last HgB A1C per patient/outside source: 7.9 mg/dL How often do you check your blood sugar?: > 4 times/day Fasting Blood glucose range (mg/dL): 70-129 Postprandial Blood glucose range (mg/dL): 70-129 Number of hypoglycemic episodes per month: 30 Can you tell when your blood sugar is low?: Yes What do you do if your blood sugar is  low?: eats something Number of hyperglycemic episodes per week: 0 Have you had a dilated eye exam in the past 12 months?: Yes Have you had a dental exam in the past 12 months?: Yes  Diet Intake:  Breakfast: pack of crackers----small peanut butter and jelly sandwhich Snack (morning): none Lunch: spagetti, vegetable stick Snack (afternoon): pack of crackers Dinner: baked chicken, carrot sticks, vegetable sticks Snack (evening): none Beverage(s): diet pepsi, water  Exercise:  Exercise: Light (walking / raking leaves) Light Exercise amount of time (min / week): 60  Individualized Plan for Diabetes Self-Management Training:   Learning Objective:  Patient will have a greater understanding of diabetes self-management.  Patient education plan per assessed needs and concerns is to attend individual sessions for     Education Topics Reviewed with Patient Today:  Definition of diabetes, type 1 and 2, and the diagnosis of diabetes, Factors that contribute to the development of diabetes Role of diet in the treatment of diabetes and the relationship between the three main macronutrients and blood glucose level, Food label reading, portion sizes and measuring food., Carbohydrate counting, Information on hints to eating out and maintain blood glucose control. Role of exercise on diabetes management, blood pressure control and cardiac health., Helped patient identify appropriate exercises in relation to his/her diabetes, diabetes complications and other health issue.   Daily foot exams Taught treatment of hypoglycemia - the 15 rule., Discussed and identified patients' treatment of hyperglycemia. Relationship between chronic complications and blood glucose control, Assessed and discussed foot care and prevention of foot problems, Retinopathy and reason  for yearly dilated eye exams, Identified and discussed with patient  current chronic complications        PATIENTS GOALS/Plan (Developed by the  patient):  Nutrition: Follow meal plan discussed, Adjust meds/carbs with exercise as discussed Physical Activity:  (after speaking with your doctor as often as you can)  Plan:   There are no Patient Instructions on file for this visit.  Expected Outcomes:  Demonstrated interest in learning. Expect positive outcomes  Education material provided: Living Well with Diabetes, Meal plan card and Snack sheet  If problems or questions, patient to contact team via:  Phone  Future DSME appointment: PRN

## 2014-06-24 ENCOUNTER — Ambulatory Visit: Payer: BC Managed Care – PPO | Attending: Internal Medicine | Admitting: *Deleted

## 2014-06-24 VITALS — BP 120/69 | HR 80 | Temp 98.1°F | Resp 20

## 2014-06-24 DIAGNOSIS — E1165 Type 2 diabetes mellitus with hyperglycemia: Secondary | ICD-10-CM

## 2014-06-24 DIAGNOSIS — R609 Edema, unspecified: Secondary | ICD-10-CM | POA: Insufficient documentation

## 2014-06-24 LAB — GLUCOSE, POCT (MANUAL RESULT ENTRY)
POC GLUCOSE: 85 mg/dL (ref 70–99)
POC Glucose: 54 mg/dl — AB (ref 70–99)

## 2014-06-24 NOTE — Progress Notes (Addendum)
Patient presents for CBG and record review for T2DM Med list reviewed; patient reports taking all meds as directed Patient reports AM fasting blood sugars ranging 190-279 states she checks fasting BS at 0645 then injects sliding scale novolog insulin and doesn't eat till 0800. Rechecks BS before eating breakfast at 0800 and BS ranging 76-177 Patient's before lunch blood sugars ranging 71-200 Patient's before dinner blood sugars ranging 66-134 States when blood sugar at or < 70 feels nervous and shaky.  Cc: b/l feet and LE swelling for "years" Patient takes diuretic (prinizide)  CBG 54 1 hour after fruit juice due to BS being 71 Patient denies s/sx of hypoglycemia at present Snack and soda given  BP 120/69 P 80 R20 T  98.1oral SpO2 97%  Per PCP: No changes to meds at this time Eat snack between meals F/u with PCP for LE edema  Patient educated to check BS AM fasting, inject sliding scale insulin and start eating meal within 5 min Patient states understanding  S/sx of hypoglycemia and immediate actions to take discussed in detail  Patient advised to call for med refills at least 7 days before running out so as not to go without.  Patient given literature on DASH Eating Plan Diabetes and Food, Diabetes and Exercise, Basic Carb Counting, The Plate Method and Hypoglycemia  CBG 85 55 minutes after consuming snack and soda. Patient discharged to home in stable condition.

## 2014-06-24 NOTE — Patient Instructions (Signed)
Diabetes Mellitus and Food It is important for you to manage your blood sugar (glucose) level. Your blood glucose level can be greatly affected by what you eat. Eating healthier foods in the appropriate amounts throughout the day at about the same time each day will help you control your blood glucose level. It can also help slow or prevent worsening of your diabetes mellitus. Healthy eating may even help you improve the level of your blood pressure and reach or maintain a healthy weight.  HOW CAN FOOD AFFECT ME? Carbohydrates Carbohydrates affect your blood glucose level more than any other type of food. Your dietitian will help you determine how many carbohydrates to eat at each meal and teach you how to count carbohydrates. Counting carbohydrates is important to keep your blood glucose at a healthy level, especially if you are using insulin or taking certain medicines for diabetes mellitus. Alcohol Alcohol can cause sudden decreases in blood glucose (hypoglycemia), especially if you use insulin or take certain medicines for diabetes mellitus. Hypoglycemia can be a life-threatening condition. Symptoms of hypoglycemia (sleepiness, dizziness, and disorientation) are similar to symptoms of having too much alcohol.  If your health care provider has given you approval to drink alcohol, do so in moderation and use the following guidelines:  Women should not have more than one drink per day, and men should not have more than two drinks per day. One drink is equal to:  12 oz of beer.  5 oz of wine.  1 oz of hard liquor.  Do not drink on an empty stomach.  Keep yourself hydrated. Have water, diet soda, or unsweetened iced tea.  Regular soda, juice, and other mixers might contain a lot of carbohydrates and should be counted. WHAT FOODS ARE NOT RECOMMENDED? As you make food choices, it is important to remember that all foods are not the same. Some foods have fewer nutrients per serving than other  foods, even though they might have the same number of calories or carbohydrates. It is difficult to get your body what it needs when you eat foods with fewer nutrients. Examples of foods that you should avoid that are high in calories and carbohydrates but low in nutrients include:  Trans fats (most processed foods list trans fats on the Nutrition Facts label).  Regular soda.  Juice.  Candy.  Sweets, such as cake, pie, doughnuts, and cookies.  Fried foods. WHAT FOODS CAN I EAT? Have nutrient-rich foods, which will nourish your body and keep you healthy. The food you should eat also will depend on several factors, including:  The calories you need.  The medicines you take.  Your weight.  Your blood glucose level.  Your blood pressure level.  Your cholesterol level. You also should eat a variety of foods, including:  Protein, such as meat, poultry, fish, tofu, nuts, and seeds (lean animal proteins are best).  Fruits.  Vegetables.  Dairy products, such as milk, cheese, and yogurt (low fat is best).  Breads, grains, pasta, cereal, rice, and beans.  Fats such as olive oil, trans fat-free margarine, canola oil, avocado, and olives. DOES EVERYONE WITH DIABETES MELLITUS HAVE THE SAME MEAL PLAN? Because every person with diabetes mellitus is different, there is not one meal plan that works for everyone. It is very important that you meet with a dietitian who will help you create a meal plan that is just right for you. Document Released: 11/23/2004 Document Revised: 03/03/2013 Document Reviewed: 01/23/2013 Exit[employer] Patient Information 2015 [employer]. This   information is not intended to replace advice given to you by your health care provider. Make sure you discuss any questions you have with your health care provider. Diabetes and Exercise Exercising regularly is important. It is not just about losing weight. It has many health benefits, such as:  Improving your overall  fitness, flexibility, and endurance.  Increasing your bone density.  Helping with weight control.  Decreasing your body fat.  Increasing your muscle strength.  Reducing stress and tension.  Improving your overall health. People with diabetes who exercise gain additional benefits because exercise:  Reduces appetite.  Improves the body's use of blood sugar (glucose).  Helps lower or control blood glucose.  Decreases blood pressure.  Helps control blood lipids (such as cholesterol and triglycerides).  Improves the body's use of the hormone insulin by:  Increasing the body's insulin sensitivity.  Reducing the body's insulin needs.  Decreases the risk for heart disease because exercising:  Lowers cholesterol and triglycerides levels.  Increases the levels of good cholesterol (such as high-density lipoproteins [HDL]) in the body.  Lowers blood glucose levels. YOUR ACTIVITY PLAN  Choose an activity that you enjoy and set realistic goals. Your health care provider or diabetes educator can help you make an activity plan that works for you. Exercise regularly as directed by your health care provider. This includes:  Performing resistance training twice a week such as push-ups, sit-ups, lifting weights, or using resistance bands.  Performing 150 minutes of cardio exercises each week such as walking, running, or playing sports.  Staying active and spending no more than 90 minutes at one time being inactive. Even short bursts of exercise are good for you. Three 10-minute sessions spread throughout the day are just as beneficial as a single 30-minute session. Some exercise ideas include:  Taking the dog for a walk.  Taking the stairs instead of the elevator.  Dancing to your favorite song.  Doing an exercise video.  Doing your favorite exercise with a friend. RECOMMENDATIONS FOR EXERCISING WITH TYPE 1 OR TYPE 2 DIABETES   Check your blood glucose before exercising. If  blood glucose levels are greater than 240 mg/dL, check for urine ketones. Do not exercise if ketones are present.  Avoid injecting insulin into areas of the body that are going to be exercised. For example, avoid injecting insulin into:  The arms when playing tennis.  The legs when jogging.  Keep a record of:  Food intake before and after you exercise.  Expected peak times of insulin action.  Blood glucose levels before and after you exercise.  The type and amount of exercise you have done.  Review your records with your health care provider. Your health care provider will help you to develop guidelines for adjusting food intake and insulin amounts before and after exercising.  If you take insulin or oral hypoglycemic agents, watch for signs and symptoms of hypoglycemia. They include:  Dizziness.  Shaking.  Sweating.  Chills.  Confusion.  Drink plenty of water while you exercise to prevent dehydration or heat stroke. Body water is lost during exercise and must be replaced.  Talk to your health care provider before starting an exercise program to make sure it is safe for you. Remember, almost any type of activity is better than none. Document Released: 05/19/2003 Document Revised: 07/13/2013 Document Reviewed: 08/05/2012 ExitCare Patient Information 2015 [employer]. This information is not intended to replace advice given to you by your health care provider. Make sure you discuss any   questions you have with your health care provider. Basic Carbohydrate Counting for Diabetes Mellitus Carbohydrate counting is a method for keeping track of the amount of carbohydrates you eat. Eating carbohydrates naturally increases the level of sugar (glucose) in your blood, so it is important for you to know the amount that is okay for you to have in every meal. Carbohydrate counting helps keep the level of glucose in your blood within normal limits. The amount of carbohydrates allowed is  different for every person. A dietitian can help you calculate the amount that is right for you. Once you know the amount of carbohydrates you can have, you can count the carbohydrates in the foods you want to eat. Carbohydrates are found in the following foods:  Grains, such as breads and cereals.  Dried beans and soy products.  Starchy vegetables, such as potatoes, peas, and corn.  Fruit and fruit juices.  Milk and yogurt.  Sweets and snack foods, such as cake, cookies, candy, chips, soft drinks, and fruit drinks. CARBOHYDRATE COUNTING There are two ways to count the carbohydrates in your food. You can use either of the methods or a combination of both. Reading the "Nutrition Facts" on Packaged Food The "Nutrition Facts" is an area that is included on the labels of almost all packaged food and beverages in the [place]. It includes the serving size of that food or beverage and information about the nutrients in each serving of the food, including the grams (g) of carbohydrate per serving.  Decide the number of servings of this food or beverage that you will be able to eat or drink. Multiply that number of servings by the number of grams of carbohydrate that is listed on the label for that serving. The total will be the amount of carbohydrates you will be having when you eat or drink this food or beverage. Learning Standard Serving Sizes of Food When you eat food that is not packaged or does not include "Nutrition Facts" on the label, you need to measure the servings in order to count the amount of carbohydrates.A serving of most carbohydrate-rich foods contains about 15 g of carbohydrates. The following list includes serving sizes of carbohydrate-rich foods that provide 15 g ofcarbohydrate per serving:   1 slice of bread (1 oz) or 1 six-inch tortilla.    of a hamburger bun or English muffin.  4-6 crackers.   cup unsweetened dry cereal.    cup hot cereal.   cup rice or  pasta.    cup mashed potatoes or  of a large baked potato.  1 cup fresh fruit or one small piece of fruit.    cup canned or frozen fruit or fruit juice.  1 cup milk.   cup plain fat-free yogurt or yogurt sweetened with artificial sweeteners.   cup cooked dried beans or starchy vegetable, such as peas, corn, or potatoes.  Decide the number of standard-size servings that you will eat. Multiply that number of servings by 15 (the grams of carbohydrates in that serving). For example, if you eat 2 cups of strawberries, you will have eaten 2 servings and 30 g of carbohydrates (2 servings x 15 g = 30 g). For foods such as soups and casseroles, in which more than one food is mixed in, you will need to count the carbohydrates in each food that is included. EXAMPLE OF CARBOHYDRATE COUNTING Sample Dinner  3 oz chicken breast.   cup of brown rice.   cup of corn.    1 cup milk.   1 cup strawberries with sugar-free whipped topping.  Carbohydrate Calculation Step 1: Identify the foods that contain carbohydrates:   Rice.   Corn.   Milk.   Strawberries. Step 2:Calculate the number of servings eaten of each:   2 servings of rice.   1 serving of corn.   1 serving of milk.   1 serving of strawberries. Step 3: Multiply each of those number of servings by 15 g:   2 servings of rice x 15 g = 30 g.   1 serving of corn x 15 g = 15 g.   1 serving of milk x 15 g = 15 g.   1 serving of strawberries x 15 g = 15 g. Step 4: Add together all of the amounts to find the total grams of carbohydrates eaten: 30 g + 15 g + 15 g + 15 g = 75 g. Document Released: 02/26/2005 Document Revised: 07/13/2013 Document Reviewed: 01/23/2013 [facility] Patient Information 2015 [place], [place]. This information is not intended to replace advice given to you by your health care provider. Make sure you discuss any questions you have with your health care provider. DASH Eating Plan DASH stands  for "Dietary Approaches to Stop Hypertension." The DASH eating plan is a healthy eating plan that has been shown to reduce high blood pressure (hypertension). Additional health benefits may include reducing the risk of type 2 diabetes mellitus, heart disease, and stroke. The DASH eating plan may also help with weight loss. WHAT DO I NEED TO KNOW ABOUT THE DASH EATING PLAN? For the DASH eating plan, you will follow these general guidelines:  Choose foods with a percent daily value for sodium of less than 5% (as listed on the food label).  Use salt-free seasonings or herbs instead of table salt or sea salt.  Check with your health care provider or pharmacist before using salt substitutes.  Eat lower-sodium products, often labeled as "lower sodium" or "no salt added."  Eat fresh foods.  Eat more vegetables, fruits, and low-fat dairy products.  Choose whole grains. Look for the word "whole" as the first word in the ingredient list.  Choose fish and skinless chicken or [place] more often than red meat. Limit fish, poultry, and meat to 6 oz (170 g) each day.  Limit sweets, desserts, sugars, and sugary drinks.  Choose heart-healthy fats.  Limit cheese to 1 oz (28 g) per day.  Eat more home-cooked food and less restaurant, buffet, and fast food.  Limit fried foods.  Cook foods using methods other than frying.  Limit canned vegetables. If you do use them, rinse them well to decrease the sodium.  When eating at a restaurant, ask that your food be prepared with less salt, or no salt if possible. WHAT FOODS CAN I EAT? Seek help from a dietitian for individual calorie needs. Grains Whole grain or whole wheat bread. Brown rice. Whole grain or whole wheat pasta. Quinoa, bulgur, and whole grain cereals. Low-sodium cereals. Corn or whole wheat flour tortillas. Whole grain cornbread. Whole grain crackers. Low-sodium crackers. Vegetables Fresh or frozen vegetables (raw, steamed, roasted, or  grilled). Low-sodium or reduced-sodium tomato and vegetable juices. Low-sodium or reduced-sodium tomato sauce and paste. Low-sodium or reduced-sodium canned vegetables.  Fruits All fresh, canned (in natural juice), or frozen fruits. Meat and Other Protein Products Ground beef (85% or leaner), grass-fed beef, or beef trimmed of fat. Skinless chicken or [place]. Ground chicken or [place]. Pork trimmed of fat. All  fish and seafood. Eggs. Dried beans, peas, or lentils. Unsalted nuts and seeds. Unsalted canned beans. Dairy Low-fat dairy products, such as skim or 1% milk, 2% or reduced-fat cheeses, low-fat ricotta or cottage cheese, or plain low-fat yogurt. Low-sodium or reduced-sodium cheeses. Fats and Oils Tub margarines without trans fats. Light or reduced-fat mayonnaise and salad dressings (reduced sodium). Avocado. Safflower, olive, or canola oils. Natural peanut or almond butter. Other Unsalted popcorn and pretzels. The items listed above may not be a complete list of recommended foods or beverages. Contact your dietitian for more options. WHAT FOODS ARE NOT RECOMMENDED? Grains White bread. White pasta. White rice. Refined cornbread. Bagels and croissants. Crackers that contain trans fat. Vegetables Creamed or fried vegetables. Vegetables in a cheese sauce. Regular canned vegetables. Regular canned tomato sauce and paste. Regular tomato and vegetable juices. Fruits Dried fruits. Canned fruit in light or heavy syrup. Fruit juice. Meat and Other Protein Products Fatty cuts of meat. Ribs, chicken wings, bacon, sausage, bologna, salami, chitterlings, fatback, hot dogs, bratwurst, and packaged luncheon meats. Salted nuts and seeds. Canned beans with salt. Dairy Whole or 2% milk, cream, half-and-half, and cream cheese. Whole-fat or sweetened yogurt. Full-fat cheeses or blue cheese. Nondairy creamers and whipped toppings. Processed cheese, cheese spreads, or cheese curds. Condiments Onion and garlic  salt, seasoned salt, table salt, and sea salt. Canned and packaged gravies. Worcestershire sauce. Tartar sauce. Barbecue sauce. Teriyaki sauce. Soy sauce, including reduced sodium. Steak sauce. Fish sauce. Oyster sauce. Cocktail sauce. Horseradish. Ketchup and mustard. Meat flavorings and tenderizers. Bouillon cubes. Hot sauce. Tabasco sauce. Marinades. Taco seasonings. Relishes. Fats and Oils Butter, stick margarine, lard, shortening, ghee, and bacon fat. Coconut, palm kernel, or palm oils. Regular salad dressings. Other Pickles and olives. Salted popcorn and pretzels. The items listed above may not be a complete list of foods and beverages to avoid. Contact your dietitian for more information. WHERE CAN I FIND MORE INFORMATION? National Heart, Lung, and Blood Institute: travelstabloid.com Document Released: 02/15/2011 Document Revised: 07/13/2013 Document Reviewed: 12/31/2012 [facility] Patient Information 2015 [place], [place]. This information is not intended to replace advice given to you by your health care provider. Make sure you discuss any questions you have with your health care provider.

## 2014-07-02 ENCOUNTER — Encounter (HOSPITAL_COMMUNITY): Payer: Self-pay | Admitting: Emergency Medicine

## 2014-07-02 ENCOUNTER — Emergency Department (HOSPITAL_COMMUNITY)
Admission: EM | Admit: 2014-07-02 | Discharge: 2014-07-02 | Disposition: A | Discharge status: Home / Self Care | Payer: BC Managed Care – PPO | Attending: Emergency Medicine | Admitting: Emergency Medicine

## 2014-07-02 DIAGNOSIS — Z794 Long term (current) use of insulin: Secondary | ICD-10-CM | POA: Insufficient documentation

## 2014-07-02 DIAGNOSIS — Z79899 Other long term (current) drug therapy: Secondary | ICD-10-CM | POA: Diagnosis not present

## 2014-07-02 DIAGNOSIS — F419 Anxiety disorder, unspecified: Secondary | ICD-10-CM | POA: Diagnosis not present

## 2014-07-02 DIAGNOSIS — Z8709 Personal history of other diseases of the respiratory system: Secondary | ICD-10-CM | POA: Insufficient documentation

## 2014-07-02 DIAGNOSIS — R000 Tachycardia, unspecified: Secondary | ICD-10-CM | POA: Diagnosis present

## 2014-07-02 DIAGNOSIS — I10 Essential (primary) hypertension: Secondary | ICD-10-CM | POA: Diagnosis not present

## 2014-07-02 DIAGNOSIS — F329 Major depressive disorder, single episode, unspecified: Secondary | ICD-10-CM | POA: Insufficient documentation

## 2014-07-02 DIAGNOSIS — Z8669 Personal history of other diseases of the nervous system and sense organs: Secondary | ICD-10-CM | POA: Diagnosis not present

## 2014-07-02 DIAGNOSIS — I471 Supraventricular tachycardia: Secondary | ICD-10-CM | POA: Diagnosis not present

## 2014-07-02 DIAGNOSIS — E119 Type 2 diabetes mellitus without complications: Secondary | ICD-10-CM | POA: Insufficient documentation

## 2014-07-02 DIAGNOSIS — Z872 Personal history of diseases of the skin and subcutaneous tissue: Secondary | ICD-10-CM | POA: Diagnosis not present

## 2014-07-02 DIAGNOSIS — Z8719 Personal history of other diseases of the digestive system: Secondary | ICD-10-CM | POA: Diagnosis not present

## 2014-07-02 LAB — CBC
HEMATOCRIT: 33.9 % — AB (ref 36.0–46.0)
Hemoglobin: 10.2 g/dL — ABNORMAL LOW (ref 12.0–15.0)
MCH: 21.4 pg — ABNORMAL LOW (ref 26.0–34.0)
MCHC: 30.1 g/dL (ref 30.0–36.0)
MCV: 71.1 fL — AB (ref 78.0–100.0)
PLATELETS: 404 10*3/uL — AB (ref 150–400)
RBC: 4.77 MIL/uL (ref 3.87–5.11)
RDW: 23.6 % — ABNORMAL HIGH (ref 11.5–15.5)
WBC: 11.5 10*3/uL — ABNORMAL HIGH (ref 4.0–10.5)

## 2014-07-02 LAB — BASIC METABOLIC PANEL
Anion gap: 11 (ref 5–15)
BUN: 30 mg/dL — AB (ref 6–23)
CO2: 22 mmol/L (ref 19–32)
Calcium: 9.1 mg/dL (ref 8.4–10.5)
Chloride: 103 mmol/L (ref 96–112)
Creatinine, Ser: 1.5 mg/dL — ABNORMAL HIGH (ref 0.50–1.10)
GFR calc Af Amer: 45 mL/min — ABNORMAL LOW (ref >90)
GFR, EST NON AFRICAN AMERICAN: 39 mL/min — AB (ref >90)
GLUCOSE: 215 mg/dL — AB (ref 70–99)
Potassium: 4 mmol/L (ref 3.5–5.1)
Sodium: 136 mmol/L (ref 135–145)

## 2014-07-02 LAB — I-STAT TROPONIN, ED: TROPONIN I, POC: 0 ng/mL (ref 0.00–0.08)

## 2014-07-02 MED ORDER — METOPROLOL TARTRATE 25 MG PO TABS: ORAL_TABLET | ORAL | Status: DC

## 2014-07-02 NOTE — ED Notes (Signed)
Per EMS- Pt was eating lunch when she felt her heart racing. Pt has hx of SVT 6 years ago0 was converted with a "shot." Upon EMS arrival, pt HR 180 SVT. Pt was given 6 of adenosine and converted to NSR rate of 96. Pt ahd no pain during the episode, but did report SOB before conversion. Pt in NAD at this time, and is pain free.

## 2014-07-02 NOTE — ED Notes (Signed)
Patient given apple juice and verbalizes no other needs at this time.

## 2014-07-02 NOTE — ED Notes (Signed)
Pt ambulated to restroom with steady gait.

## 2014-07-02 NOTE — ED Provider Notes (Signed)
CSN: [id number]     Arrival date & time 07/02/14  1306 History   First MD Initiated Contact with Patient 07/02/14 1310     Chief Complaint  Patient presents with  . Tachycardia     (Consider location/radiation/quality/duration/timing/severity/associated sxs/prior Treatment) Patient is a 54 y.o. female presenting with palpitations. The history is provided by the patient. No language interpreter was used.  Palpitations Palpitations quality:  Fast Onset quality:  Sudden Timing:  Constant Progression:  Resolved Chronicity:  Recurrent Context: stimulant use   Context: not anxiety, not caffeine, not dehydration, not exercise and not hyperventilation   Relieved by: medication. Worsened by:  Nothing Ineffective treatments:  Valsalva Associated symptoms: no back pain, no chest pain and no shortness of breath   Risk factors: no diabetes mellitus, no heart disease and no hx of atrial fibrillation    Pt reports she felt her heart racing while eating lunch.    Past Medical History  Diagnosis Date  . Abscess   . Sleep difficulties   . Fatigue   . Sinus problem   . Encounter for drainage of abscess     increased drainage from abscess on buttock  . Abscess of buttock   . Hypertension   . Anxiety   . Dysrhythmia     approx 3 years ago, increased heart rate,  . Depression     sees [name]  . Sleep apnea     2008- sleep study, neg. for sleep apnea   . Anal fistula   . Diabetes mellitus without complication    Past Surgical History  Procedure Laterality Date  . Knee arthroscopy      left  . Shoulder surgery  04/14/09    right  . Incise and drain abcess      abscess on right thigh and buttock  . Anal examination under anesthesia  02/21/11    anal fistula  . Breast surgery  patient does not remember date of procedure    pull fluid off lft br   Family History  Problem Relation Age of Onset  . Hypertension Mother   . Alzheimer's disease Mother   . Diabetes Father   .  Anesthesia problems Neg Hx   . Hypotension Neg Hx   . Malignant hyperthermia Neg Hx   . Pseudochol deficiency Neg Hx    History  Substance Use Topics  . Smoking status: Never Smoker   . Smokeless tobacco: Never Used  . Alcohol Use: No   OB History    Gravida Para Term Preterm AB TAB SAB Ectopic Multiple Living   1 1  1            Review of Systems  Respiratory: Negative for choking, chest tightness, shortness of breath and wheezing.   Cardiovascular: Positive for palpitations. Negative for chest pain.  Musculoskeletal: Negative for back pain.  All other systems reviewed and are negative.     Allergies  Metformin and related and Tramadol  Home Medications   Prior to Admission medications   Medication Sig Start Date End Date Taking? Authorizing Provider  acetaminophen-codeine (TYLENOL #3) 300-30 MG per tablet Take 1 tablet by mouth 2 (two) times daily as needed for moderate pain. 06/03/14   Lance Bosch, NP  atorvastatin (LIPITOR) 20 MG tablet Take 20 mg by mouth at bedtime.    Historical Provider, MD  diazepam (VALIUM) 2 MG tablet Take 1 tablet (2 mg total) by mouth every 12 (twelve) hours as needed for anxiety. 05/20/14  Lance Bosch, NP  ferrous sulfate 325 (65 FE) MG tablet Take 1 tablet (325 mg total) by mouth 2 (two) times daily with a meal. 06/03/14   Lance Bosch, NP  FLUOXETINE HCL PO Take 40 mg by mouth.     Historical Provider, MD  glipiZIDE (GLUCOTROL) 5 MG tablet Take 1 tablet (5 mg total) by mouth 2 (two) times daily before a meal. 05/20/14   Lance Bosch, NP  insulin aspart (NOVOLOG) 100 UNIT/ML injection Check blood sugar three times per day before meals If 150-200, give 3 units 201-250, give 5 units 251-300, give 7 units 301-350, give 9 units 351-400, give 11 units If greater than 400, call clinic 06/03/14   Lance Bosch, NP  insulin detemir (LEVEMIR) 100 UNIT/ML injection Inject 45 Units into the skin at bedtime.    Historical Provider, MD  insulin  lispro (HUMALOG) 100 UNIT/ML injection Inject 5 Units into the skin once.    Historical Provider, MD  lisinopril-hydrochlorothiazide (PRINZIDE,ZESTORETIC) 10-12.5 MG per tablet Take 1 tablet by mouth daily.      Historical Provider, MD  omeprazole (PRILOSEC) 20 MG capsule Take 1 capsule (20 mg total) by mouth daily. 05/20/14   Lance Bosch, NP   SpO2 98%  LMP 11/10/2013 Physical Exam  Constitutional: She is oriented to person, place, and time. She appears well-developed and well-nourished.  HENT:  Head: Normocephalic and atraumatic.  Eyes: Conjunctivae and EOM are normal. Pupils are equal, round, and reactive to light.  Neck: Normal range of motion. Neck supple.  Cardiovascular: Normal rate and normal heart sounds.   Pulmonary/Chest: Effort normal and breath sounds normal.  Abdominal: Soft.  Musculoskeletal: Normal range of motion.  Neurological: She is alert and oriented to person, place, and time.  Skin: Skin is warm.  Psychiatric: She has a normal mood and affect.  Nursing note and vitals reviewed.   ED Course  Procedures (including critical care time) Labs Review Labs Reviewed - No data to display  Imaging Review No results found.   EKG Interpretation None      MDM Pt called EMS.   When EMS put pt on a monitor she was in SVT at 185.   Pt was given adenosine 6mg  and heart rate dropped to 99.   Final diagnoses:  SVT (supraventricular tachycardia)    Pt observed in ED.   Pt has maintained a heart rate in the 80's.   Pt reports this happened once about 4 years ago.  No medications.   Pt has never seen cardiology.  Pt advised to follow up at wellness clinic and to follow up with cardiology for recheck.       Hollace Kinnier [place], PA-C 07/02/14 [place], MD 07/05/14 [phone]

## 2014-07-02 NOTE — Discharge Instructions (Signed)
Supraventricular Tachycardia °Supraventricular tachycardia (SVT) is an abnormal heart rhythm (arrhythmia) that causes the heart to beat very fast (tachycardia). This kind of fast heartbeat originates in the upper chambers of the heart (atria). SVT can cause the heart to beat greater than 100 beats per minute. SVT can have a rapid burst of heartbeats. This can start and stop suddenly without warning and is called nonsustained. SVT can also be sustained, in which the heart beats at a continuous fast rate.  °CAUSES  °There can be different causes of SVT. Some of these include: °· Heart valve problems such as mitral valve prolapse. °· An enlarged heart (hypertrophic cardiomyopathy). °· Congenital heart problems. °· Heart inflammation (pericarditis). °· Hyperthyroidism. °· Low potassium or magnesium levels. °· Caffeine. °· Drug use such as cocaine, methamphetamines, or stimulants. °· Some over-the-counter medicines such as: °¨ Decongestants. °¨ Diet medicines. °¨ Herbal medicines. °SYMPTOMS  °Symptoms of SVT can vary. Symptoms depend on whether the SVT is sustained or nonsustained. You may experience: °· No symptoms (asymptomatic). °· An awareness of your heart beating rapidly (palpitations). °· Shortness of breath. °· Chest pain or pressure. °If your blood pressure drops because of the SVT, you may experience: °· Fainting or near fainting. °· Weakness. °· Dizziness. °DIAGNOSIS  °Different tests can be performed to diagnose SVT, such as: °· An electrocardiogram (EKG). This is a painless test that records the electrical activity of your heart. °· Holter monitor. This is a 24 hour recording of your heart rhythm. You will be given a diary. Write down all symptoms that you have and what you were doing at the time you experienced symptoms. °· Arrhythmia monitor. This is a small device that your wear for several weeks. It records the heart rhythm when you have symptoms. °· Echocardiogram. This is an imaging test to help detect  abnormal heart structure such as congenital abnormalities, heart valve problems, or heart enlargement. °· Stress test. This test can help determine if the SVT is related to exercise. °· Electrophysiology study (EPS). This is a procedure that evaluates your heart's electrical system and can help your caregiver find the cause of your SVT. °TREATMENT  °Treatment of SVT depends on the symptoms, how often it recurs, and whether there are any underlying heart problems.  °· If symptoms are rare and no other cardiac disease is present, no treatment may be needed. °· Blood work may be done to check potassium, magnesium, and thyroid hormone levels to see if they are abnormal. If these levels are abnormal, treatment to correct the problems will occur. °Medicines °Your caregiver may use oral medicines to treat SVT. These medicines are given for long-term control of SVT. Medicines may be used alone or in combination with other treatments. These medicines work to slow nerve impulses in the heart muscle. These medicines can also be used to treat high blood pressure. Some of these medicines may include: °· Calcium channel blockers. °· Beta blockers. °· Digoxin. °Nonsurgical procedures °Nonsurgical techniques may be used if oral medicines do not work. Some examples include: °· Cardioversion. This technique uses either drugs or an electrical shock to restore a normal heart rhythm. °¨ Cardioversion drugs may be given through an intravenous (IV) line to help "reset" the heart rhythm. °¨ In electrical cardioversion, the caregiver shocks your heart to stop its beat for a split second. This helps to reset the heart to a normal rhythm. °· Ablation. This procedure is done under mild sedation. High frequency radio wave energy is used to   destroy the area of heart tissue responsible for the SVT. °HOME CARE INSTRUCTIONS  °· Do not smoke. °· Only take medicines prescribed by your caregiver. Check with your caregiver before using over-the-counter  medicines. °· Check with your caregiver about how much alcohol and caffeine (coffee, tea, colas, or chocolate) you may have. °· It is very important to keep all follow-up referrals and appointments in order to properly manage this problem. °SEEK IMMEDIATE MEDICAL CARE IF: °· You have dizziness. °· You faint or nearly faint. °· You have shortness of breath. °· You have chest pain or pressure. °· You have sudden nausea or vomiting. °· You have profuse sweating. °· You are concerned about how long your symptoms last. °· You are concerned about the frequency of your SVT episodes. °If you have the above symptoms, call your local emergency services (911 in [place] immediately. Do not drive yourself to the hospital. °MAKE SURE YOU:  °· Understand these instructions. °· Will watch your condition. °· Will get help right away if you are not doing well or get worse. °Document Released: 02/26/2005 Document Revised: 05/21/2011 Document Reviewed: 06/10/2008 °Exit[employer]® Patient Information ©2015 [employer]. This information is not intended to replace advice given to you by your health care provider. Make sure you discuss any questions you have with your health care provider. ° °

## 2014-07-15 ENCOUNTER — Ambulatory Visit: Payer: BC Managed Care – PPO | Attending: Internal Medicine | Admitting: Internal Medicine

## 2014-07-15 ENCOUNTER — Encounter: Payer: Self-pay | Admitting: Internal Medicine

## 2014-07-15 VITALS — BP 132/67 | HR 51 | Temp 98.0°F | Resp 16 | Ht 63.0 in | Wt 267.0 lb

## 2014-07-15 DIAGNOSIS — Z79899 Other long term (current) drug therapy: Secondary | ICD-10-CM | POA: Insufficient documentation

## 2014-07-15 DIAGNOSIS — F419 Anxiety disorder, unspecified: Secondary | ICD-10-CM | POA: Insufficient documentation

## 2014-07-15 DIAGNOSIS — Z794 Long term (current) use of insulin: Secondary | ICD-10-CM | POA: Diagnosis not present

## 2014-07-15 DIAGNOSIS — R002 Palpitations: Secondary | ICD-10-CM

## 2014-07-15 DIAGNOSIS — R1030 Lower abdominal pain, unspecified: Secondary | ICD-10-CM

## 2014-07-15 DIAGNOSIS — E119 Type 2 diabetes mellitus without complications: Secondary | ICD-10-CM

## 2014-07-15 DIAGNOSIS — F329 Major depressive disorder, single episode, unspecified: Secondary | ICD-10-CM | POA: Insufficient documentation

## 2014-07-15 DIAGNOSIS — I10 Essential (primary) hypertension: Secondary | ICD-10-CM | POA: Insufficient documentation

## 2014-07-15 LAB — POCT URINALYSIS DIPSTICK
Bilirubin, UA: NEGATIVE
Glucose, UA: NEGATIVE
Ketones, UA: NEGATIVE
Leukocytes, UA: NEGATIVE
NITRITE UA: NEGATIVE
PH UA: 5.5
Protein, UA: NEGATIVE
RBC UA: NEGATIVE
Spec Grav, UA: <=1.005
UROBILINOGEN UA: 0.2

## 2014-07-15 LAB — GLUCOSE, POCT (MANUAL RESULT ENTRY)
POC Glucose: 52 mg/dl — AB (ref 70–99)
POC Glucose: 116 mg/dl — AB (ref 70–99)

## 2014-07-15 NOTE — Progress Notes (Signed)
Pt is here following up on her diabetes and her HTN. Pt states that her lower abdomen hurts.

## 2014-07-15 NOTE — Progress Notes (Signed)
Patient ID: [name], female   DOB: Nov 22, 1960, 55 y.o.   MRN: [id number]  CC: ED f/u, abdominal pain   HPI: [name] is a 54 y.o. female here today for a follow up visit.  Patient has past medical history of diabetes mellitus, HTN, depression, and sleep apnea. Patient reports that she was at work earlier this week and began to having feelings of heart palpitations. When the EMS arrived at her job she states that her pulse was 205 and she was given a "injection". She has only had a small number of these episodes in the past and reports that she can feel when they happen. She is currently on Metoprolol 25 mg daily to help with palpitations.   She notes that her blood sugars are 90 to 190 and she has not had to use Novolog in 2 weeks. She has seen great improvement in her cbg readings.   Today she c/o of abdominal pain in lower abdomen that radiates to her back. She is  often nauseous. Pain described as a nagging constant pain. Occasional diarrhea mostly after using iron pills. Noticeable difference in energy since starting iron. Pain not associated with meals and she "feels heavy". Has had normal colonoscopy.  Allergies  Allergen Reactions  . Metformin And Related Nausea Only    Hands and feet tingling  . Tramadol Nausea Only   Past Medical History  Diagnosis Date  . Abscess   . Sleep difficulties   . Fatigue   . Sinus problem   . Encounter for drainage of abscess     increased drainage from abscess on buttock  . Abscess of buttock   . Hypertension   . Anxiety   . Dysrhythmia     approx 3 years ago, increased heart rate,  . Depression     sees [name]  . Sleep apnea     2008- sleep study, neg. for sleep apnea   . Anal fistula   . Diabetes mellitus without complication    Current Outpatient Prescriptions on File Prior to Visit  Medication Sig Dispense Refill  . atorvastatin (LIPITOR) 20 MG tablet Take 20 mg by mouth at bedtime.    . diazepam (VALIUM) 2 MG  tablet Take 1 tablet (2 mg total) by mouth every 12 (twelve) hours as needed for anxiety. 60 tablet 1  . ferrous sulfate 325 (65 FE) MG tablet Take 1 tablet (325 mg total) by mouth 2 (two) times daily with a meal. 60 tablet 2  . FLUOXETINE HCL PO Take 40 mg by mouth.     Marland Kitchen glipiZIDE (GLUCOTROL) 5 MG tablet Take 1 tablet (5 mg total) by mouth 2 (two) times daily before a meal. 60 tablet 3  . insulin detemir (LEVEMIR) 100 UNIT/ML injection Inject 45 Units into the skin at bedtime.    Marland Kitchen lisinopril-hydrochlorothiazide (PRINZIDE,ZESTORETIC) 10-12.5 MG per tablet Take 1 tablet by mouth daily.      . metoprolol (LOPRESSOR) 25 MG tablet One po bid 60 tablet 0  . omeprazole (PRILOSEC) 20 MG capsule Take 1 capsule (20 mg total) by mouth daily. 30 capsule 3  . acetaminophen-codeine (TYLENOL #3) 300-30 MG per tablet Take 1 tablet by mouth 2 (two) times daily as needed for moderate pain. (Patient not taking: Reported on 07/15/2014) 30 tablet 0  . insulin aspart (NOVOLOG) 100 UNIT/ML injection Check blood sugar three times per day before meals If 150-200, give 3 units 201-250, give 5 units 251-300, give 7 units 301-350, give 9  units 351-400, give 11 units If greater than 400, call clinic (Patient not taking: Reported on 07/15/2014) 3 vial 4  . insulin lispro (HUMALOG) 100 UNIT/ML injection Inject 5 Units into the skin once.     No current facility-administered medications on file prior to visit.   Family History  Problem Relation Age of Onset  . Hypertension Mother   . Alzheimer's disease Mother   . Diabetes Father   . Anesthesia problems Neg Hx   . Hypotension Neg Hx   . Malignant hyperthermia Neg Hx   . Pseudochol deficiency Neg Hx    History   Social History  . Marital Status: Divorced    Spouse Name: N/A  . Number of Children: N/A  . Years of Education: N/A   Occupational History  . Not on file.   Social History Main Topics  . Smoking status: Never Smoker   . Smokeless tobacco: Never  Used  . Alcohol Use: No  . Drug Use: No  . Sexual Activity: Yes   Other Topics Concern  . Not on file   Social History Narrative    Review of Systems: See HPI.    Objective:   Filed Vitals:   07/15/14 1517  BP: 132/67  Pulse: 51  Temp: 98 F (36.7 C)  Resp: 16    Physical Exam  Constitutional: She is oriented to person, place, and time.  Cardiovascular: Normal rate, regular rhythm and normal heart sounds.   No murmur heard. Pulmonary/Chest: Effort normal and breath sounds normal.  Abdominal: Soft. Bowel sounds are normal. There is tenderness (lower quadrants).  Neurological: She is alert and oriented to person, place, and time.  Skin: Skin is warm and dry.  Psychiatric: She has a normal mood and affect.  Vitals reviewed.   Lab Results  Component Value Date   WBC 11.5* 07/02/2014   HGB 10.2* 07/02/2014   HCT 33.9* 07/02/2014   MCV 71.1* 07/02/2014   PLT 404* 07/02/2014   Lab Results  Component Value Date   CREATININE 1.50* 07/02/2014   BUN 30* 07/02/2014   NA 136 07/02/2014   K 4.0 07/02/2014   CL 103 07/02/2014   CO2 22 07/02/2014    Lab Results  Component Value Date   HGBA1C 7.90 05/20/2014   Lipid Panel  No results found for: CHOL, TRIG, HDL, CHOLHDL, VLDL, LDLCALC     Assessment and plan:   Zaliyah was seen today for follow-up.  Diagnoses and all orders for this visit:  Type 2 diabetes mellitus without complication Orders: -     Glucose (CBG) -     POCT urinalysis dipstick -     Glucose (CBG) Congratulated patient on improving sugars. Continue current therapy   Palpitations Orders: -     Ambulatory referral to Cardiology---patient may require Holter monitor for recordings   Lower abdominal pain Orders: -     US Abdomen Complete; Future  Return for as scheduled.   Chari Manning, NP-C [facility] [phone] 07/15/2014, 3:34 PM e

## 2014-07-20 ENCOUNTER — Other Ambulatory Visit: Payer: Self-pay | Admitting: *Deleted

## 2014-07-20 DIAGNOSIS — E1142 Type 2 diabetes mellitus with diabetic polyneuropathy: Secondary | ICD-10-CM

## 2014-07-20 MED ORDER — LISINOPRIL-HYDROCHLOROTHIAZIDE 10-12.5 MG PO TABS: 1.0000 | ORAL_TABLET | Freq: Every day | ORAL | Status: DC

## 2014-07-20 MED ORDER — INSULIN DETEMIR 100 UNIT/ML ~~LOC~~ SOLN: 45.0000 [IU] | Freq: Every day | SUBCUTANEOUS | Status: DC

## 2014-07-20 MED ORDER — METOPROLOL TARTRATE 25 MG PO TABS: ORAL_TABLET | ORAL | Status: DC

## 2014-07-20 NOTE — Progress Notes (Signed)
Pt needed refills for her BS and BP medications. I sent them to our pharmacy.

## 2014-07-21 ENCOUNTER — Ambulatory Visit (HOSPITAL_COMMUNITY): Payer: BC Managed Care – PPO

## 2014-07-22 ENCOUNTER — Ambulatory Visit: Payer: BC Managed Care – PPO | Admitting: Internal Medicine

## 2014-08-05 ENCOUNTER — Telehealth: Payer: Self-pay | Admitting: *Deleted

## 2014-08-05 NOTE — Telephone Encounter (Signed)
Patient called to ask if Ms Feliciana Rossetti could switch her to a generic insulin.  She cannot afford her Novolog and her Levimir every month.  She has exceeded her charge account at the pharmacy and can no longer charge her medications according to patient.  Spoke to [place] in the pharmacy that any insulin prescribed to the patient would be $46 per month based on her insurance.  She recommended the patient speak with a Development worker, community for advice/assistance.

## 2014-08-23 ENCOUNTER — Encounter: Payer: Self-pay | Admitting: Internal Medicine

## 2014-08-23 ENCOUNTER — Ambulatory Visit: Payer: BC Managed Care – PPO | Attending: Internal Medicine | Admitting: Internal Medicine

## 2014-08-23 VITALS — BP 107/62 | HR 59 | Temp 98.7°F | Resp 16 | Ht 63.0 in | Wt 268.0 lb

## 2014-08-23 DIAGNOSIS — E1142 Type 2 diabetes mellitus with diabetic polyneuropathy: Secondary | ICD-10-CM | POA: Insufficient documentation

## 2014-08-23 DIAGNOSIS — R748 Abnormal levels of other serum enzymes: Secondary | ICD-10-CM | POA: Diagnosis not present

## 2014-08-23 DIAGNOSIS — Z794 Long term (current) use of insulin: Secondary | ICD-10-CM | POA: Diagnosis not present

## 2014-08-23 DIAGNOSIS — I10 Essential (primary) hypertension: Secondary | ICD-10-CM | POA: Diagnosis not present

## 2014-08-23 DIAGNOSIS — R946 Abnormal results of thyroid function studies: Secondary | ICD-10-CM | POA: Diagnosis not present

## 2014-08-23 DIAGNOSIS — K219 Gastro-esophageal reflux disease without esophagitis: Secondary | ICD-10-CM | POA: Insufficient documentation

## 2014-08-23 DIAGNOSIS — R7989 Other specified abnormal findings of blood chemistry: Secondary | ICD-10-CM

## 2014-08-23 DIAGNOSIS — F419 Anxiety disorder, unspecified: Secondary | ICD-10-CM | POA: Diagnosis not present

## 2014-08-23 LAB — POCT GLYCOSYLATED HEMOGLOBIN (HGB A1C): Hemoglobin A1C: 6.5

## 2014-08-23 LAB — GLUCOSE, POCT (MANUAL RESULT ENTRY): POC GLUCOSE: 143 mg/dL — AB (ref 70–99)

## 2014-08-23 MED ORDER — OMEPRAZOLE 20 MG PO CPDR: 20.0000 mg | DELAYED_RELEASE_CAPSULE | Freq: Every day | ORAL | Status: DC

## 2014-08-23 MED ORDER — INSULIN DETEMIR 100 UNIT/ML ~~LOC~~ SOLN: 45.0000 [IU] | Freq: Every day | SUBCUTANEOUS | Status: DC

## 2014-08-23 MED ORDER — GLIPIZIDE 5 MG PO TABS: 5.0000 mg | ORAL_TABLET | Freq: Two times a day (BID) | ORAL | Status: DC

## 2014-08-23 MED ORDER — GABAPENTIN 100 MG PO CAPS: 100.0000 mg | ORAL_CAPSULE | Freq: Three times a day (TID) | ORAL | Status: DC

## 2014-08-23 MED ORDER — DIAZEPAM 2 MG PO TABS: 2.0000 mg | ORAL_TABLET | Freq: Two times a day (BID) | ORAL | Status: DC | PRN

## 2014-08-23 MED ORDER — METOPROLOL TARTRATE 25 MG PO TABS: ORAL_TABLET | ORAL | Status: DC

## 2014-08-23 NOTE — Progress Notes (Signed)
Patient ID: [name], female   DOB: June 18, 1960, 54 y.o.   MRN: [id number] SUBJECTIVE: 54 y.o. female for follow up of diabetes. Diabetic Review of Systems - medication compliance: compliant all of the time, diabetic diet compliance: compliant all of the time, home glucose monitoring: is performed regularly, further diabetic ROS: no polyuria or polydipsia, no chest pain, dyspnea or TIA's, has dysesthesias in the hands. Pain has improved in her feet.  Other symptoms and concerns: Reports that she has not used her novolog. Wants thyroid level rechecked due to low levels previously.  She would like to have something to help with diabetic nerve pain because it prevents her from sleeping at night. Patient also reports that since her dose of diazepam has been dropped she is only able to sleep 3 hours per night. She reports depression but states that she has never been on a SSRI.  Current Outpatient Prescriptions  Medication Sig Dispense Refill  . acetaminophen-codeine (TYLENOL #3) 300-30 MG per tablet Take 1 tablet by mouth 2 (two) times daily as needed for moderate pain. 30 tablet 0  . atorvastatin (LIPITOR) 20 MG tablet Take 20 mg by mouth at bedtime.    . diazepam (VALIUM) 2 MG tablet Take 1 tablet (2 mg total) by mouth every 12 (twelve) hours as needed for anxiety. 60 tablet 1  . ferrous sulfate 325 (65 FE) MG tablet Take 1 tablet (325 mg total) by mouth 2 (two) times daily with a meal. 60 tablet 2  . FLUOXETINE HCL PO Take 40 mg by mouth.     Marland Kitchen glipiZIDE (GLUCOTROL) 5 MG tablet Take 1 tablet (5 mg total) by mouth 2 (two) times daily before a meal. 60 tablet 3  . insulin detemir (LEVEMIR) 100 UNIT/ML injection Inject 0.45 mLs (45 Units total) into the skin at bedtime. 10 mL 4  . lisinopril-hydrochlorothiazide (PRINZIDE,ZESTORETIC) 10-12.5 MG per tablet Take 1 tablet by mouth daily. 90 tablet 3  . metoprolol tartrate (LOPRESSOR) 25 MG tablet One po bid 60 tablet 3  . omeprazole (PRILOSEC) 20 MG  capsule Take 1 capsule (20 mg total) by mouth daily. 30 capsule 3  . insulin aspart (NOVOLOG) 100 UNIT/ML injection Check blood sugar three times per day before meals If 150-200, give 3 units 201-250, give 5 units 251-300, give 7 units 301-350, give 9 units 351-400, give 11 units If greater than 400, call clinic (Patient not taking: Reported on 07/15/2014) 3 vial 4  . insulin lispro (HUMALOG) 100 UNIT/ML injection Inject 5 Units into the skin once.     No current facility-administered medications for this visit.    OBJECTIVE: Appearance: alert, well appearing, and in no distress and overweight. BP 107/62 mmHg  Pulse 59  Temp(Src) 98.7 F (37.1 C) (Oral)  Resp 16  Ht 5\' 3"  (1.6 m)  Wt 268 lb (121.564 kg)  BMI 47.49 kg/m2  SpO2 97%  Exam: heart sounds normal rate, regular rhythm, normal S1, S2, no murmurs, rubs, clicks or gallops, no hepatosplenomegaly, no carotid bruits, feet: warm, good capillary refill and normal DP and PT pulses  ASSESSMENT: Diabetes Mellitus: well controlled and improved   [name] was seen today for follow-up, diabetes and medication problem.  Diagnoses and all orders for this visit:  Type 2 diabetes mellitus with diabetic polyneuropathy Orders: -     POCT glucose (manual entry) -     POCT glycosylated hemoglobin (Hb A1C) -     glipiZIDE (GLUCOTROL) 5 MG tablet; Take 1 tablet (5 mg total)  by mouth 2 (two) times daily before a meal. -     insulin detemir (LEVEMIR) 100 UNIT/ML injection; Inject 0.45 mLs (45 Units total) into the skin at bedtime. -     gabapentin (NEURONTIN) 100 MG capsule; Take 1 capsule (100 mg total) by mouth 3 (three) times daily. For diabetic nerve pain Praised patient on her improvement in A1C. She may stop novolog since she has been unable to afford the medication and her levels have improved. Diet and weight loss discussed.  Elevated serum creatinine Orders: -     COMPLETE METABOLIC PANEL WITH GFR Will recheck with GFR. May need  referral to Nephrology if no improvement. Likely kidney injury due to long-standing uncontrolled diabetes.  Low TSH level Orders: -     TSH -     T4, Free Will recheck. Has no symptoms of overactive thyroid per last lab result in March. In fact patient reports weight gain.   Anxiety Orders: -     diazepam (VALIUM) 2 MG tablet; Take 1 tablet (2 mg total) by mouth every 12 (twelve) hours as needed for anxiety. I will refill medication today. Would like to ultimately try patient on Trazodone to help with sleep and depression. I have started her on gabapentin today so I will try Trazodone on next visit. Gabapentin may also help with some anxiety  Gastroesophageal reflux disease, esophagitis presence not specified Orders: -     Refilled omeprazole (PRILOSEC) 20 MG capsule; Take 1 capsule (20 mg total) by mouth daily.  HTN -     metoprolol tartrate (LOPRESSOR) 25 MG tablet; One po bid Patient blood pressure is stable and may continue on current medication.  Education on diet, exercise, and modifiable risk factors discussed. Will obtain appropriate labs as needed. Will follow up in 3-6 months.    Return in about 3 months (around 11/23/2014) for Diabetes Mellitus.  Chari Manning, NP 08/23/2014 7:05 PM

## 2014-08-23 NOTE — Patient Instructions (Signed)
If your kidney level is still elevated we may refer you to kidney doctor  Try the gabapentin for diabetic nerve pain and see if that helps your sleep. If on the next visit it does not help we will try another medication to help with your depression and sleep concerns.   Keep up the good work, your diabetes is well controlled!!!!!

## 2014-08-23 NOTE — Progress Notes (Signed)
F/U DM  Glucose been running 60-190 Stop taking insulin  1 month

## 2014-08-24 ENCOUNTER — Telehealth: Payer: Self-pay | Admitting: *Deleted

## 2014-08-24 ENCOUNTER — Other Ambulatory Visit: Payer: Self-pay | Admitting: *Deleted

## 2014-08-24 ENCOUNTER — Ambulatory Visit (HOSPITAL_COMMUNITY)
Admission: RE | Admit: 2014-08-24 | Discharge: 2014-08-24 | Disposition: A | Discharge status: Home / Self Care | Payer: BC Managed Care – PPO | Source: Ambulatory Visit | Attending: Internal Medicine | Admitting: Internal Medicine

## 2014-08-24 ENCOUNTER — Other Ambulatory Visit: Payer: Self-pay | Admitting: Internal Medicine

## 2014-08-24 DIAGNOSIS — N2889 Other specified disorders of kidney and ureter: Secondary | ICD-10-CM

## 2014-08-24 DIAGNOSIS — R197 Diarrhea, unspecified: Secondary | ICD-10-CM | POA: Diagnosis not present

## 2014-08-24 DIAGNOSIS — R1030 Lower abdominal pain, unspecified: Secondary | ICD-10-CM | POA: Diagnosis not present

## 2014-08-24 DIAGNOSIS — Z0000 Encounter for general adult medical examination without abnormal findings: Secondary | ICD-10-CM

## 2014-08-24 DIAGNOSIS — R109 Unspecified abdominal pain: Secondary | ICD-10-CM

## 2014-08-24 DIAGNOSIS — R110 Nausea: Secondary | ICD-10-CM | POA: Insufficient documentation

## 2014-08-24 LAB — COMPLETE METABOLIC PANEL WITH GFR
ALK PHOS: 69 U/L (ref 39–117)
ALT: 10 U/L (ref 0–35)
AST: 9 U/L (ref 0–37)
Albumin: 3.6 g/dL (ref 3.5–5.2)
BILIRUBIN TOTAL: 0.3 mg/dL (ref 0.2–1.2)
BUN: 28 mg/dL — AB (ref 6–23)
CO2: 22 mEq/L (ref 19–32)
Calcium: 9.1 mg/dL (ref 8.4–10.5)
Chloride: 105 mEq/L (ref 96–112)
Creat: 1.28 mg/dL — ABNORMAL HIGH (ref 0.50–1.10)
GFR, Est African American: 55 mL/min — ABNORMAL LOW
GFR, Est Non African American: 48 mL/min — ABNORMAL LOW
Glucose, Bld: 115 mg/dL — ABNORMAL HIGH (ref 70–99)
POTASSIUM: 3.9 meq/L (ref 3.5–5.3)
Sodium: 136 mEq/L (ref 135–145)
Total Protein: 6.4 g/dL (ref 6.0–8.3)

## 2014-08-24 LAB — TSH: TSH: 0.416 u[IU]/mL (ref 0.350–4.500)

## 2014-08-24 LAB — T4, FREE: Free T4: 0.76 ng/dL — ABNORMAL LOW (ref 0.80–1.80)

## 2014-08-24 MED ORDER — ACETAMINOPHEN-CODEINE #3 300-30 MG PO TABS: 1.0000 | ORAL_TABLET | Freq: Three times a day (TID) | ORAL | Status: DC | PRN

## 2014-08-24 NOTE — Progress Notes (Signed)
PCP gave verbal order for Tylenol #3 1 tablet every 8 hours for moderate pain.  Faxing Rx to patient's pharmacy and patient called.

## 2014-08-24 NOTE — Telephone Encounter (Signed)
Patient given lab results regarding her thyroid function and referral to nephrologist.  Patient understand and no questions asked.

## 2014-08-24 NOTE — Telephone Encounter (Signed)
-----   Message from Lance Bosch, NP sent at 08/24/2014  2:55 PM EDT ----- Thyroid is back to normal. Please send referral to Nephrology. Let her know that I would like them to look at her kidney function and see if we need to make any changes to her medications to preserve function.

## 2014-08-24 NOTE — Telephone Encounter (Signed)
Patient aware of her ultrasound results and her upcoming CT appointment on 6/20 at 10:00 am at [facility]  Patient asking for pain medication because she states her pain in her abdomen is a 10/10.  Will ask provider and call patient back.

## 2014-08-30 ENCOUNTER — Ambulatory Visit (HOSPITAL_COMMUNITY)
Admission: RE | Admit: 2014-08-30 | Discharge: 2014-08-30 | Disposition: A | Discharge status: Home / Self Care | Payer: BC Managed Care – PPO | Source: Ambulatory Visit | Attending: Internal Medicine | Admitting: Internal Medicine

## 2014-08-30 ENCOUNTER — Encounter: Payer: Self-pay | Admitting: Internal Medicine

## 2014-08-30 DIAGNOSIS — N839 Noninflammatory disorder of ovary, fallopian tube and broad ligament, unspecified: Secondary | ICD-10-CM | POA: Diagnosis not present

## 2014-08-30 DIAGNOSIS — D259 Leiomyoma of uterus, unspecified: Secondary | ICD-10-CM | POA: Diagnosis not present

## 2014-08-30 DIAGNOSIS — N2889 Other specified disorders of kidney and ureter: Secondary | ICD-10-CM

## 2014-08-30 DIAGNOSIS — K429 Umbilical hernia without obstruction or gangrene: Secondary | ICD-10-CM | POA: Insufficient documentation

## 2014-08-30 DIAGNOSIS — M545 Low back pain: Secondary | ICD-10-CM | POA: Insufficient documentation

## 2014-08-30 DIAGNOSIS — R918 Other nonspecific abnormal finding of lung field: Secondary | ICD-10-CM | POA: Diagnosis not present

## 2014-08-30 DIAGNOSIS — R1030 Lower abdominal pain, unspecified: Secondary | ICD-10-CM | POA: Insufficient documentation

## 2014-08-31 ENCOUNTER — Telehealth: Payer: Self-pay | Admitting: Internal Medicine

## 2014-08-31 ENCOUNTER — Other Ambulatory Visit: Payer: Self-pay | Admitting: Internal Medicine

## 2014-08-31 DIAGNOSIS — N838 Other noninflammatory disorders of ovary, fallopian tube and broad ligament: Secondary | ICD-10-CM

## 2014-08-31 MED ORDER — HYDROCODONE-ACETAMINOPHEN 5-325 MG PO TABS: 1.0000 | ORAL_TABLET | Freq: Three times a day (TID) | ORAL | Status: DC | PRN

## 2014-08-31 NOTE — Telephone Encounter (Signed)
Called patient to give her CT results. I have explained to patient that she was found to have a lesion on her right ovary and lung nodules. Patient will be called back by the nurse with a scheduled date for a PET scan to rule out cancer/Metastasis. Patient reports that the tylenol 3 is not helping her pain any and her current pain is a 9/10 at the moment with intermittent diarrhea. I have sent patient prescription of vicodin 5-325 mg #30 tablets. I have went over in great detail for patient to not take vicodin and diazepam together due to risk of respiratory depression or death. Patient verbalizes understanding and agrees to not take diazepam.

## 2014-09-01 ENCOUNTER — Encounter: Payer: Self-pay | Admitting: Cardiovascular Disease

## 2014-09-01 ENCOUNTER — Ambulatory Visit (INDEPENDENT_AMBULATORY_CARE_PROVIDER_SITE_OTHER): Payer: BC Managed Care – PPO | Admitting: Cardiovascular Disease

## 2014-09-01 VITALS — BP 110/58 | HR 67 | Ht 63.0 in | Wt 270.0 lb

## 2014-09-01 DIAGNOSIS — R0602 Shortness of breath: Secondary | ICD-10-CM

## 2014-09-01 DIAGNOSIS — E669 Obesity, unspecified: Secondary | ICD-10-CM | POA: Insufficient documentation

## 2014-09-01 DIAGNOSIS — R0609 Other forms of dyspnea: Secondary | ICD-10-CM

## 2014-09-01 DIAGNOSIS — R600 Localized edema: Secondary | ICD-10-CM

## 2014-09-01 DIAGNOSIS — I471 Supraventricular tachycardia, unspecified: Secondary | ICD-10-CM

## 2014-09-01 NOTE — Progress Notes (Signed)
Patient ID: [name], female   DOB: 1961/02/25, 54 y.o.   MRN: [id number]     Cardiology Office Note   Date:  09/01/2014   ID:  [name], DOB 04-Jun-1960, MRN [id number]  PCP:  Chari Manning, NP  Cardiologist:   Sanda Klein, MD   Chief Complaint  Patient presents with  . Palpitations    Patient has had SOB, swelling, and SOB.      History of Present Illness: [name] is a 54 y.o. female who presents for  Complaints of shortness of breath , lower extremity edema and supraventricular tachycardia.   in April of this year she was seen in the emergency room for an episode of rapid sustained palpitations. EMS documented narrow complex tachycardia at a rate of 185 bpm. The arrhythmia resolved after a single 6 mg dose of intravenous adenosine. There are no strips or ECG tracings of the arrhythmia itself. Postconversion ECG in the emergency room shows a normal tracing without evidence of preexcitation.  She has had episodes of SVT going back at least to 2005. These occur roughly on a monthly basis, but she is usually able to interrupt them with the Valsalva maneuver. She had to be seen in the emergency room 3 times in 2005, 2012 and 2016 to receive adenosine. During the episodes of tachycardia she describes chest heaviness and shortness of breath. The palpitations have very abrupt onset and terminate immediately with adenosine. After the April emergency room visit she was started on treatment with metoprolol. She has not really noticed a change in the frequency of events. Her her heart rate has been in the low 50s since then.   Independent of the palpitations she describes exertional dyspnea, NYHA functional class II. She has persistent leg edema up to the knees. She has gained 15 pounds since April.  She denies exertional chest discomfort.   She has had some problems with abdominal swelling and discomfort and underwent a CT that showed a  A few subpleural nodular  densities , most likely subpleural lymph nodes. She is scheduled to undergo a PET scan next week.  Note was made of atherosclerotic calcification of the abdominal aortic branches.   she has had diabetes mellitus for at least 5 years and requires both oral anti-diabetics and insulin for control.  Hemoglobin A1c was 6.5 earlier this month urine normal TSH.  Creatinine 1.28. She has had systemic hypertension for at least 5 years.  No proteinuria detected on urinalysis, mild microalbuminuria at 3.5 mg/dL.  Evidence of sleep apnea by previous study.    Past Medical History  Diagnosis Date  . Abscess   . Sleep difficulties   . Fatigue   . Sinus problem   . Encounter for drainage of abscess     increased drainage from abscess on buttock  . Abscess of buttock   . Hypertension   . Anxiety   . Dysrhythmia     approx 3 years ago, increased heart rate,  . Depression     sees [name]  . Sleep apnea     2008- sleep study, neg. for sleep apnea   . Anal fistula   . Diabetes mellitus without complication     Past Surgical History  Procedure Laterality Date  . Knee arthroscopy      left  . Shoulder surgery  04/14/09    right  . Incise and drain abcess      abscess on right thigh and buttock  . Anal examination under anesthesia  02/21/11    anal fistula  . Breast surgery  patient does not remember date of procedure    pull fluid off lft br     Current Outpatient Prescriptions  Medication Sig Dispense Refill  . acetaminophen-codeine (TYLENOL #3) 300-30 MG per tablet Take 1 tablet by mouth every 8 (eight) hours as needed for moderate pain. 30 tablet 0  . atorvastatin (LIPITOR) 20 MG tablet Take 20 mg by mouth at bedtime.    . diazepam (VALIUM) 2 MG tablet Take 1 tablet (2 mg total) by mouth every 12 (twelve) hours as needed for anxiety. 60 tablet 1  . ferrous sulfate 325 (65 FE) MG tablet Take 1 tablet (325 mg total) by mouth 2 (two) times daily with a meal. 60 tablet 2  . FLUOXETINE HCL  PO Take 40 mg by mouth.     . gabapentin (NEURONTIN) 100 MG capsule Take 1 capsule (100 mg total) by mouth 3 (three) times daily. For diabetic nerve pain 90 capsule 3  . glipiZIDE (GLUCOTROL) 5 MG tablet Take 1 tablet (5 mg total) by mouth 2 (two) times daily before a meal. 60 tablet 3  . HYDROcodone-acetaminophen (NORCO/VICODIN) 5-325 MG per tablet Take 1 tablet by mouth every 8 (eight) hours as needed for moderate pain. 30 tablet 0  . insulin detemir (LEVEMIR) 100 UNIT/ML injection Inject 0.45 mLs (45 Units total) into the skin at bedtime. 10 mL 4  . lisinopril-hydrochlorothiazide (PRINZIDE,ZESTORETIC) 10-12.5 MG per tablet Take 1 tablet by mouth daily. 90 tablet 3  . metoprolol tartrate (LOPRESSOR) 25 MG tablet One po bid 60 tablet 3  . omeprazole (PRILOSEC) 20 MG capsule Take 1 capsule (20 mg total) by mouth daily. 30 capsule 3   No current facility-administered medications for this visit.    Allergies:   Metformin and related and Tramadol    Social History:  The patient  reports that she has never smoked. She has never used smokeless tobacco. She reports that she does not drink alcohol or use illicit drugs.   Family History:  The patient's family history includes Alzheimer's disease in her mother; Diabetes in her father; Hypertension in her mother. There is no history of Anesthesia problems, Hypotension, Malignant hyperthermia, or Pseudochol deficiency.    ROS:  Please see the history of present illness.    Otherwise, review of systems  is significantfor the absence of orthopnea, paroxysmal atrial dyspnea, exertional chest discomfort, focal neurological deficits, intermittent claudication, cough hemoptysis , fever or chills.   All other systems are reviewed and negative.    PHYSICAL EXAM: VS:  BP 110/58 mmHg  Pulse 67  Ht 5\' 3"  (1.6 m)  Wt 270 lb (122.471 kg)  BMI 47.84 kg/m2  LMP 08/26/2014 (Exact Date) , BMI Body mass index is 47.84 kg/(m^2).  General: Alert, oriented x3, no  distress,  Morbid obesity limits certain aspects of the physical exam Head: no evidence of trauma, PERRL, EOMI, no exophtalmos or lid lag, no myxedema, no xanthelasma; normal ears, nose and oropharynx Neck: normal jugular venous pulsations and no hepatojugular reflux; brisk carotid pulses without delay and no carotid bruits Chest: clear to auscultation, no signs of consolidation by percussion or palpation, normal fremitus, symmetrical and full respiratory excursions Cardiovascular:  Unable to identify the apical impulse, regular rhythm, normal first and second heart sounds, no murmurs, rubs or gallops Abdomen: no tenderness or distention, no masses by palpation, no abnormal pulsatility or arterial bruits, normal bowel sounds,  Unable to palpate the liver or  spleenExtremities: no clubbing, cyanosis or edema; 2+ radial, ulnar and brachial pulses bilaterally; 2+ right femoral, posterior tibial and dorsalis pedis pulses; 2+ left femoral, posterior tibial and dorsalis pedis pulses; no subclavian or femoral bruits Neurological: grossly nonfocal Psych: euthymic mood, full affect   EKG:  EKG is not ordered today. The ekg ordered in April demonstrates  Sinus rhythm without evidence of preexcitation, low voltage explained by  obesity   Recent Labs: 07/02/2014: Hemoglobin 10.2*; Platelets 404* 08/23/2014: ALT 10; BUN 28*; Creat 1.28*; Potassium 3.9; Sodium 136; TSH 0.416    Lipid Panel No results found for: CHOL, TRIG, HDL, CHOLHDL, VLDL, LDLCALC, LDLDIRECT    Wt Readings from Last 3 Encounters:  09/01/14 270 lb (122.471 kg)  08/23/14 268 lb (121.564 kg)  07/15/14 267 lb (121.11 kg)      Other studies Reviewed: Additional studies/ records that were reviewed today include:  Emergency room records  and imaging studies   ASSESSMENT AND PLAN:  1.  Paroxysmal supraventricular tachycardia, almost certainly AV node reentry. I don't think she will tolerate an increase in beta blocker dose since she  reports her heart rate is usually in the low 50s. Advised to try the Valsalva maneuver were diving reflex at home area differential these are unsuccessful she may take an additional as needed 25 mg dose of metoprolol tartrate. Symptoms persist for more than 30 minutes or if at any point associated severe dyspnea, chest pain or presyncope she should call for emergency medical assistance. Discussed the option for radiofrequency ablation of the future if her symptoms cannot be controlled or if medications that control her symptoms produce intolerable side effects.  2.   Exertional dyspnea and lower extremity edema seem to suggest that she may have congestive heart failure: will check an echocardiogram and follow-up. It is likely that we'll need to switch from thiazide to loop diuretics. She has numerous risk factors for coronary disease (diabetes mellitus, hypertension, obesity) but does not have angina pectoris or visible calcifications in her coronary arteries on CT.  Nephrotic syndrome could also explain the volume overload and other symptoms, but her previous urinalysis does not support severe proteinuria.  3.   Essential hypertension, well controlled.  Adjustments in her medications may be necessary depending on the echo findings.  4.   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  The patient does not have concerns regarding medicines.  The following changes have been made:   No change in regular medications, but may take an extra metoprolol 25 mg as needed for sustained SVT  Labs/ tests ordered today include:  Orders Placed This Encounter  Procedures  . ECHOCARDIOGRAM COMPLETE   Patient Instructions  Medication Instructions:   YOU MAY TAKE AN EXTRA METOPROLOL FOR RAPID PALPITATIONS THAT DO NOT GO AWAY WITH VALSALVA  MANEUVERS.  Labwork:  NONE  Testing/Procedures:  Your physician has requested that you have an echocardiogram. Echocardiography is a painless test that uses sound waves to create images of your heart. It provides your doctor with information about the size and shape of your heart and how well your heart's chambers and valves are working. This procedure takes approximately one hour. There are no restrictions for this procedure.    Follow-Up:  APPOINTMENT WITH DR. Sallyanne Kuster AFTER THE ECHOCARDIOGRAM.  Any Other Special Instructions Will Be Listed Below (If Applicable).  GOOGLE:  AV NODE  RE-ENTRY TACHYCARDIA      Signed, Sanda Klein, MD  09/01/2014 5:15 PM    Sanda Klein, MD, [facility] [phone] office [phone] pager

## 2014-09-01 NOTE — Patient Instructions (Signed)
Medication Instructions:   YOU MAY TAKE AN EXTRA METOPROLOL FOR RAPID PALPITATIONS THAT DO NOT GO AWAY WITH VALSALVA MANEUVERS.  Labwork:  NONE  Testing/Procedures:  Your physician has requested that you have an echocardiogram. Echocardiography is a painless test that uses sound waves to create images of your heart. It provides your doctor with information about the size and shape of your heart and how well your heart's chambers and valves are working. This procedure takes approximately one hour. There are no restrictions for this procedure.    Follow-Up:  APPOINTMENT WITH DR. Sallyanne Kuster AFTER THE ECHOCARDIOGRAM.  Any Other Special Instructions Will Be Listed Below (If Applicable).  GOOGLE:  AV NODE RE-ENTRY TACHYCARDIA

## 2014-09-06 ENCOUNTER — Other Ambulatory Visit: Payer: Self-pay

## 2014-09-07 ENCOUNTER — Telehealth: Payer: Self-pay | Admitting: Internal Medicine

## 2014-09-07 ENCOUNTER — Ambulatory Visit (HOSPITAL_COMMUNITY)
Admission: RE | Admit: 2014-09-07 | Discharge: 2014-09-07 | Disposition: A | Discharge status: Home / Self Care | Payer: BC Managed Care – PPO | Source: Ambulatory Visit | Attending: Internal Medicine | Admitting: Internal Medicine

## 2014-09-07 ENCOUNTER — Other Ambulatory Visit: Payer: Self-pay | Admitting: Internal Medicine

## 2014-09-07 ENCOUNTER — Ambulatory Visit (HOSPITAL_BASED_OUTPATIENT_CLINIC_OR_DEPARTMENT_OTHER)
Admission: RE | Admit: 2014-09-07 | Discharge: 2014-09-07 | Disposition: A | Discharge status: Home / Self Care | Payer: BC Managed Care – PPO | Source: Ambulatory Visit | Attending: Cardiovascular Disease | Admitting: Cardiovascular Disease

## 2014-09-07 DIAGNOSIS — R0602 Shortness of breath: Secondary | ICD-10-CM

## 2014-09-07 DIAGNOSIS — N839 Noninflammatory disorder of ovary, fallopian tube and broad ligament, unspecified: Secondary | ICD-10-CM | POA: Insufficient documentation

## 2014-09-07 DIAGNOSIS — N838 Other noninflammatory disorders of ovary, fallopian tube and broad ligament: Secondary | ICD-10-CM

## 2014-09-07 DIAGNOSIS — N83201 Unspecified ovarian cyst, right side: Secondary | ICD-10-CM

## 2014-09-07 DIAGNOSIS — E119 Type 2 diabetes mellitus without complications: Secondary | ICD-10-CM | POA: Insufficient documentation

## 2014-09-07 DIAGNOSIS — R918 Other nonspecific abnormal finding of lung field: Secondary | ICD-10-CM | POA: Diagnosis not present

## 2014-09-07 DIAGNOSIS — R000 Tachycardia, unspecified: Secondary | ICD-10-CM | POA: Diagnosis not present

## 2014-09-07 DIAGNOSIS — E042 Nontoxic multinodular goiter: Secondary | ICD-10-CM | POA: Diagnosis not present

## 2014-09-07 DIAGNOSIS — I10 Essential (primary) hypertension: Secondary | ICD-10-CM | POA: Insufficient documentation

## 2014-09-07 DIAGNOSIS — I700 Atherosclerosis of aorta: Secondary | ICD-10-CM | POA: Diagnosis not present

## 2014-09-07 DIAGNOSIS — E041 Nontoxic single thyroid nodule: Secondary | ICD-10-CM

## 2014-09-07 LAB — GLUCOSE, CAPILLARY: Glucose-Capillary: 147 mg/dL — ABNORMAL HIGH (ref 65–99)

## 2014-09-07 MED ORDER — FLUDEOXYGLUCOSE F - 18 (FDG) INJECTION
14.7000 | Freq: Once | INTRAVENOUS | Status: AC | PRN
Start: 2014-09-07 — End: 2014-09-07
  Administered 2014-09-07: 14.7 via INTRAVENOUS

## 2014-09-07 NOTE — Telephone Encounter (Signed)
Pt calling for imaging results, please f/u with pt.

## 2014-09-08 ENCOUNTER — Telehealth: Payer: Self-pay | Admitting: Cardiovascular Disease

## 2014-09-08 DIAGNOSIS — Z79899 Other long term (current) drug therapy: Secondary | ICD-10-CM

## 2014-09-08 MED ORDER — FUROSEMIDE 40 MG PO TABS
40.0000 mg | ORAL_TABLET | ORAL | Status: DC
Start: 2014-09-08 — End: 2014-09-09

## 2014-09-08 NOTE — Telephone Encounter (Signed)
Pt instructed on dosing of new medication, reporting for labwork after being on new medication, instructed to monitor weights and report in 7-10 days. She acknowledged and expressed understanding of instructions.

## 2014-09-08 NOTE — Telephone Encounter (Signed)
Let's try furosemide 40 mg daily for 3 days, then take 40 mg only three days a week (MWF) and check BMET and report her weight in 7-10 days please

## 2014-09-08 NOTE — Telephone Encounter (Signed)
Normal echo per Dr. Loletha Grayer  No answer when dialed.

## 2014-09-08 NOTE — Telephone Encounter (Signed)
Pt said she was told to call back after she received her echo results. Dr C was supposed to decide about whether she should take Lasix.

## 2014-09-08 NOTE — Telephone Encounter (Signed)
Spoke to patient - normal echo results reported.  Patient explains, per her conversation w/ Dr. Sallyanne Kuster at last OV, lasix had been suggested d/t problem of bilateral lower extremity edema. Will defer to physician for recommendation on this/dosing.

## 2014-09-09 ENCOUNTER — Other Ambulatory Visit: Payer: Self-pay | Admitting: *Deleted

## 2014-09-09 ENCOUNTER — Other Ambulatory Visit: Payer: Self-pay | Admitting: Internal Medicine

## 2014-09-09 DIAGNOSIS — Z1231 Encounter for screening mammogram for malignant neoplasm of breast: Secondary | ICD-10-CM

## 2014-09-09 MED ORDER — FUROSEMIDE 40 MG PO TABS: 40.0000 mg | ORAL_TABLET | Freq: Every day | ORAL | Status: DC

## 2014-09-09 NOTE — Telephone Encounter (Signed)
Med refilled for e-req - written yesterday, went to print option by default d/t signature line character count exceeded.

## 2014-09-10 ENCOUNTER — Ambulatory Visit (HOSPITAL_COMMUNITY): Payer: BC Managed Care – PPO

## 2014-09-14 NOTE — Telephone Encounter (Signed)
Patient is looking for PET scan results.

## 2014-09-14 NOTE — Telephone Encounter (Signed)
I already resulted that and it looks like [name] called her with results on 6/29. Look at the image and you will see my response. In case you missed it see below  [place] news is there was NO cancer found!!! But they did see a thyroid nodule that I believe needs to be evaluated by a endocrinologist. I have placed referral to Endo.  I will also place referral to GYN for the ovarian cyst. It is benign but I believe that is why she is having such severe pain in her lower abdomen.

## 2014-09-15 ENCOUNTER — Telehealth: Payer: Self-pay | Admitting: Internal Medicine

## 2014-09-15 ENCOUNTER — Ambulatory Visit: Payer: BC Managed Care – PPO | Admitting: Internal Medicine

## 2014-09-15 NOTE — Telephone Encounter (Signed)
Can patient get a refill on HYDROcodone-acetaminophen (NORCO/VICODIN) 5-325 MG per tablet [[id number]] , she has an appointment with [facility] on the 7/27.Marland KitchenMarland Kitchenplease f/u with patient

## 2014-09-16 ENCOUNTER — Telehealth: Payer: Self-pay | Admitting: Internal Medicine

## 2014-09-16 NOTE — Telephone Encounter (Signed)
Patient has called in today to inform PCP that her sugar is above 300 and she would like some advice on what to do to bring it down; MD was consulted and recommended patient take two glipizide with food and plenty of water, increase levemir to 50 mg for tonight only and if Glucose levels do not decrease after two hours and a recheck please consult additional medical attention; patient was also told to refrain from consuming watermelon;

## 2014-09-17 ENCOUNTER — Encounter: Payer: Self-pay | Admitting: Internal Medicine

## 2014-09-17 ENCOUNTER — Ambulatory Visit (INDEPENDENT_AMBULATORY_CARE_PROVIDER_SITE_OTHER): Payer: BC Managed Care – PPO | Admitting: Internal Medicine

## 2014-09-17 VITALS — BP 118/74 | HR 74 | Temp 98.1°F | Resp 12 | Ht 64.0 in | Wt 265.0 lb

## 2014-09-17 DIAGNOSIS — E1142 Type 2 diabetes mellitus with diabetic polyneuropathy: Secondary | ICD-10-CM | POA: Diagnosis not present

## 2014-09-17 DIAGNOSIS — E041 Nontoxic single thyroid nodule: Secondary | ICD-10-CM

## 2014-09-17 DIAGNOSIS — R946 Abnormal results of thyroid function studies: Secondary | ICD-10-CM

## 2014-09-17 DIAGNOSIS — E042 Nontoxic multinodular goiter: Secondary | ICD-10-CM | POA: Insufficient documentation

## 2014-09-17 DIAGNOSIS — R7989 Other specified abnormal findings of blood chemistry: Secondary | ICD-10-CM | POA: Insufficient documentation

## 2014-09-17 DIAGNOSIS — E1165 Type 2 diabetes mellitus with hyperglycemia: Secondary | ICD-10-CM | POA: Diagnosis not present

## 2014-09-17 LAB — TSH: TSH: 0.57 u[IU]/mL (ref 0.35–4.50)

## 2014-09-17 LAB — T4, FREE: FREE T4: 0.82 ng/dL (ref 0.60–1.60)

## 2014-09-17 LAB — T3, FREE: T3, Free: 3.7 pg/mL (ref 2.3–4.2)

## 2014-09-17 MED ORDER — METFORMIN HCL 500 MG PO TABS: 500.0000 mg | ORAL_TABLET | Freq: Two times a day (BID) | ORAL | Status: DC

## 2014-09-17 MED ORDER — GLIPIZIDE 5 MG PO TABS: 10.0000 mg | ORAL_TABLET | Freq: Two times a day (BID) | ORAL | Status: DC

## 2014-09-17 NOTE — Progress Notes (Signed)
Patient ID: [name], female   DOB: 01/27/1961, 54 y.o.   MRN: [id number]   HPI  [name] is a 54 y.o.-year-old female, referred by her PCP, Dr. Feliciana Rossetti, for evaluation for 2 R thyroid nodule. She would also want me to address her DM2, dx 2011, insulin-dependent since 9628, without complications.  Thyroid nodules: Pt had a recent PET scan (09/07/2014) (to investigate an ovarian mass) that showed 2 hyperactive thyroid nodules in the R thyroid lobe:  No hypermetabolic lymph nodes in the neck.  2.5 cm hypermetabolic right thyroid nodule, max SUV 6.3.  1.5 cm midline inferior right thyroid nodule, max SUV 4.8.  Pt was sent to me to further investigate these nodules.  I reviewed pt's thyroid tests: Lab Results  Component Value Date   TSH 0.416 08/23/2014   TSH 0.287* 05/20/2014   FREET4 0.76* 08/23/2014    Pt denies feeling nodules in neck, hoarseness, dysphagia/odynophagia, SOB with lying down. + occasional choking when drinks with a straw.   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 No FH of thyroid cancer. No h/o radiation tx to head or neck.  No seaweed or kelp, no recent contrast studies. No steroid use. No herbal supplements. No Biotin.  DM2: Last hemoglobin A1c was: Lab Results  Component Value Date   HGBA1C 6.50 08/23/2014   HGBA1C 7.90 05/20/2014   HGBA1C * 04/15/2009    10.2 (NOTE) The ADA recommends the following therapeutic goal for glycemic control related to Hgb A1c measurement: Goal of therapy: <6.5 Hgb A1c  Reference: American Diabetes Association: Clinical Practice Recommendations 2010, Diabetes Care, 2010, 33: (Suppl  1).   Pt is  on a regimen of: - Levemir 45 units at bedtime - Glipizide 5 mg 2x a day She stopped Metformin in 02/2014. She was on Januvia in 03/2014 >> N/V >> stopped.  Pt checks her sugars 1x a day and they are: - am: 80-100, 180-220 in last week. - 2h after b'fast: n/c - before lunch: n/c - 2h after lunch: n/c - before dinner: n/c - 2h after dinner: n/c - bedtime: n/c - nighttime: n/c No lows. Lowest sugar was 71; she has hypoglycemia awareness at 70.  Highest sugar was 400. She started to have very high sugars 1 week ago, after starting Lasix.   Glucometer: ReliOn  Pt's meals are: - Breakfast: skips - Lunch: sandwich, carrots + dip or crackers - Dinner: chicken (baked) + veggies  - Snacks: 2 small cookies before bedtime No sodas.   - + CKD stage 2, last BUN/creatinine:  Lab Results  Component Value Date   BUN 28* 08/23/2014   CREATININE 1.28* 08/23/2014  On Lisinopril. - last set of lipids: No results found for: CHOL, HDL, LDLCALC, LDLDIRECT, TRIG, CHOLHDL  On Lipitor 20 mg daily. - last eye exam was in 2015. No DR.  - no numbness and tingling in her feet.  Pt has FH of DM in father  ROS: Constitutional: see HPI Eyes: no blurry vision, no xerophthalmia ENT: no sore throat, + see HPI Cardiovascular: no CP/+ SOB/no palpitations/+ leg swelling Respiratory: no cough/+ SOB Gastrointestinal: + N/no V/no  D/+ C/+ acid reflux Musculoskeletal: + muscle/no joint aches Skin: no rashes Neurological: no tremors/numbness/tingling/dizziness Psychiatric: + both:depression/anxiety  Past Medical History  Diagnosis Date  . Abscess   . Sleep difficulties   . Fatigue   . Sinus problem   . Encounter for drainage of abscess     increased drainage from abscess on buttock  . Abscess of buttock   . Hypertension   . Anxiety   . Dysrhythmia     approx 3 years ago, increased heart rate,  . Depression     sees [name]  . Sleep apnea     2008- sleep study, neg. for sleep apnea   .  Anal fistula   . Diabetes mellitus without complication    Past Surgical History  Procedure Laterality Date  . Knee arthroscopy      left  . Shoulder surgery  04/14/09    right  . Incise and drain abcess      abscess on right thigh and buttock  . Anal examination under anesthesia  02/21/11    anal fistula  . Breast surgery  patient does not remember date of procedure    pull fluid off lft br   History   Social History  . Marital Status: Divorced    Spouse Name: N/A  . Number of Children: 0   Occupational History  .  Control and instrumentation engineer    Social History Main Topics  . Smoking status: Never Smoker   . Smokeless tobacco: Never Used  . Alcohol Use: No  . Drug Use: No   Current Outpatient Prescriptions on File Prior to Visit  Medication Sig Dispense Refill  . atorvastatin (LIPITOR) 20 MG tablet Take 20 mg by mouth at bedtime.    . diazepam (VALIUM) 2 MG tablet Take 1 tablet (2 mg total) by mouth every 12 (twelve) hours as needed for anxiety. 60 tablet 1  . ferrous sulfate 325 (65 FE) MG tablet Take 1 tablet (325 mg total) by mouth 2 (two) times daily with a meal. 60 tablet 2  . FLUOXETINE HCL PO Take 40 mg by mouth.     . furosemide (LASIX) 40 MG tablet Take 1 tablet (40 mg total) by mouth daily. For 3 days. Then take 1 tablet (40mg  total) by mouth 3 (three) times a week. 90 tablet 1  . gabapentin (NEURONTIN) 100 MG capsule Take 1 capsule (100 mg total) by mouth 3 (three) times daily. For diabetic nerve pain 90 capsule 3  . insulin detemir (LEVEMIR) 100 UNIT/ML injection Inject 0.45 mLs (45 Units total) into the skin at bedtime. 10 mL 4  . lisinopril-hydrochlorothiazide (PRINZIDE,ZESTORETIC) 10-12.5 MG per tablet Take 1 tablet by mouth daily. 90 tablet 3  . metoprolol tartrate (LOPRESSOR) 25 MG tablet One po bid 60 tablet 3  . omeprazole (PRILOSEC) 20 MG capsule Take 1 capsule (20 mg total) by mouth daily. 30 capsule 3  . acetaminophen-codeine (TYLENOL #3) 300-30 MG per tablet  Take 1 tablet by mouth every 8 (eight) hours as needed for moderate pain. (Patient not taking: Reported on 09/17/2014) 30 tablet 0  . HYDROcodone-acetaminophen (NORCO/VICODIN) 5-325 MG per tablet Take 1 tablet by mouth every 8 (eight) hours as needed for moderate pain. (Patient not taking: Reported on 09/17/2014) 30 tablet 0   No current facility-administered medications on file prior to visit.   Allergies  Allergen Reactions  . Metformin And Related Nausea Only    Hands and feet tingling  . Tramadol Nausea Only  Family History  Problem Relation Age of Onset  . Hypertension Mother   . Alzheimer's disease Mother   . Diabetes Father   . Anesthesia problems Neg Hx   . Hypotension Neg Hx   . Malignant hyperthermia Neg Hx   . Pseudochol deficiency Neg Hx    PE: BP 118/74 mmHg  Pulse 74  Temp(Src) 98.1 F (36.7 C) (Oral)  Resp 12  Ht 5\' 4"  (1.626 m)  Wt 265 lb (120.203 kg)  BMI 45.46 kg/m2  SpO2 96%  LMP 08/26/2014 (Exact Date) Wt Readings from Last 3 Encounters:  09/17/14 265 lb (120.203 kg)  09/01/14 270 lb (122.471 kg)  08/23/14 268 lb (121.564 kg)   Constitutional: overweight, in NAD Eyes: PERRLA, EOMI, no exophthalmos ENT: moist mucous membranes, no thyromegaly, no cervical lymphadenopathy Cardiovascular: RRR, No MRG Respiratory: CTA B Gastrointestinal: abdomen soft, NT, ND, BS+ Musculoskeletal: no deformities, strength intact in all 4 Skin: moist, warm, no rashes Neurological: no tremor with outstretched hands, DTR normal in all 4  ASSESSMENT: 1. DM2, non-insulin-dependent, uncontrolled, without complications  PLAN:  1. Patient with long-standing, uncontrolled diabetes, on oral antidiabetic regimen, which became insufficient - We discussed about options for treatment, and I suggested to:  There are no Patient Instructions on file for this visit. - Strongly advised her to start checking sugars at different times of the day - check 2 times a day, rotating checks -  given sugar log and advised how to fill it and to bring it at next appt  - given foot care handout and explained the principles  - given instructions for hypoglycemia management "15-15 rule"  - advised for yearly eye exams - Return to clinic in 1 mo with sugar log    I reviewed her chart and she also has a history of HTN, HL, anemia, GERD..  ROS: Constitutional: + see HPI, + poor sleep  Eyes: no blurry vision, no xerophthalmia ENT: no sore throat, no nodules palpated in throat, no dysphagia/odynophagia, no hoarseness Cardiovascular: no CP/SOB/palpitations/leg swelling Respiratory: no cough/SOB Gastrointestinal: no N/V/D/C Musculoskeletal: no muscle/joint aches Skin: no rashes Neurological: no tremors/numbness/tingling/dizziness Psychiatric: no depression/anxiety  Past Medical History  Diagnosis Date  . Abscess   . Sleep difficulties   . Fatigue   . Sinus problem   . Encounter for drainage of abscess     increased drainage from abscess on buttock  . Abscess of buttock   . Hypertension   . Anxiety   . Dysrhythmia     approx 3 years ago, increased heart rate,  . Depression     sees [name]  . Sleep apnea     2008- sleep study, neg. for sleep apnea   . Anal fistula   . Diabetes mellitus without complication    Past Surgical History  Procedure Laterality Date  . Knee arthroscopy      left  . Shoulder surgery  04/14/09    right  . Incise and drain abcess      abscess on right thigh and buttock  . Anal examination under anesthesia  02/21/11    anal fistula  . Breast surgery  patient does not remember date of procedure    pull fluid off lft br   History   Social History  . Marital Status: Divorced    Spouse Name: N/A  . Number of Children:    Occupational History  .    Social History Main Topics  . Smoking status: Never Smoker   .  Smokeless tobacco: Never Used  . Alcohol Use: No  . Drug Use: No   Current Outpatient Prescriptions on File Prior to Visit   Medication Sig Dispense Refill  . atorvastatin (LIPITOR) 20 MG tablet Take 20 mg by mouth at bedtime.    . diazepam (VALIUM) 2 MG tablet Take 1 tablet (2 mg total) by mouth every 12 (twelve) hours as needed for anxiety. 60 tablet 1  . ferrous sulfate 325 (65 FE) MG tablet Take 1 tablet (325 mg total) by mouth 2 (two) times daily with a meal. 60 tablet 2  . FLUOXETINE HCL PO Take 40 mg by mouth.     . furosemide (LASIX) 40 MG tablet Take 1 tablet (40 mg total) by mouth daily. For 3 days. Then take 1 tablet (40mg  total) by mouth 3 (three) times a week. 90 tablet 1  . gabapentin (NEURONTIN) 100 MG capsule Take 1 capsule (100 mg total) by mouth 3 (three) times daily. For diabetic nerve pain 90 capsule 3  . glipiZIDE (GLUCOTROL) 5 MG tablet Take 1 tablet (5 mg total) by mouth 2 (two) times daily before a meal. 60 tablet 3  . insulin detemir (LEVEMIR) 100 UNIT/ML injection Inject 0.45 mLs (45 Units total) into the skin at bedtime. 10 mL 4  . lisinopril-hydrochlorothiazide (PRINZIDE,ZESTORETIC) 10-12.5 MG per tablet Take 1 tablet by mouth daily. 90 tablet 3  . metoprolol tartrate (LOPRESSOR) 25 MG tablet One po bid 60 tablet 3  . omeprazole (PRILOSEC) 20 MG capsule Take 1 capsule (20 mg total) by mouth daily. 30 capsule 3  . acetaminophen-codeine (TYLENOL #3) 300-30 MG per tablet Take 1 tablet by mouth every 8 (eight) hours as needed for moderate pain. (Patient not taking: Reported on 09/17/2014) 30 tablet 0  . HYDROcodone-acetaminophen (NORCO/VICODIN) 5-325 MG per tablet Take 1 tablet by mouth every 8 (eight) hours as needed for moderate pain. (Patient not taking: Reported on 09/17/2014) 30 tablet 0   No current facility-administered medications on file prior to visit.   Allergies  Allergen Reactions  . Metformin And Related Nausea Only    Hands and feet tingling  . Tramadol Nausea Only   Family History  Problem Relation Age of Onset  . Hypertension Mother   . Alzheimer's disease Mother   .  Diabetes Father   . Anesthesia problems Neg Hx   . Hypotension Neg Hx   . Malignant hyperthermia Neg Hx   . Pseudochol deficiency Neg Hx    PE: BP 118/74 mmHg  Pulse 74  Temp(Src) 98.1 F (36.7 C) (Oral)  Resp 12  Ht 5\' 4"  (1.626 m)  Wt 265 lb (120.203 kg)  BMI 45.46 kg/m2  SpO2 96%  LMP 08/26/2014 (Exact Date) Wt Readings from Last 3 Encounters:  09/17/14 265 lb (120.203 kg)  09/01/14 270 lb (122.471 kg)  08/23/14 268 lb (121.564 kg)   Constitutional: overweight, in NAD Eyes: PERRLA, EOMI, no exophthalmos ENT: moist mucous membranes, + thyromegaly, no cervical lymphadenopathy Cardiovascular: RRR, No MRG Respiratory: CTA B Gastrointestinal: abdomen soft, NT, ND, BS+ Musculoskeletal: no deformities, strength intact in all 4;  Skin: moist, warm, no rashes Neurological: no tremor with outstretched hands, DTR normal in all 4  ASSESSMENT: 1. 2 thyroid nodules, R thyroid lobe - hyperactive on PET scan  2. DM2, insulin-dependent, uncontrolled, without complications  PLAN: 1. Thyroid nodules - I reviewed the report of her PET scan along with the patient. I explained that PET hypermetabolism in the thyroid nodules usually means overactive nodules  or cancerous nodules. Hypermetabolism scattered throughout the gland usually means increased inflammation as in Graves' disease. Patient did have a low TSH in 05/2014, which has normalized in 08/2014. However, her TSH is still on the low side, and I would like to obtain a thyroid uptake and scan to see if her nodules are overactive. This would be very reassuring, essentially eliminating the risk for cancer. I believe that she would also would benefit from getting a thyroid ultrasound to better characterize the nodule structurally, But for now, I would like to proceed only with a thyroid uptake and scan. She agrees with the plan.  Pt does not have a thyroid cancer family history or a personal history of RxTx to head/neck. These would favor  benignity.   - For now, I will also like to repeat her thyroid tests, TSH, free T4, free T3  2. DM2, insulin-dependent, uncontrolled, without complications - Patient would also want me to address this, as she has had some very high sugars in the last week, after she was started on Lasix by her cardiologist. Previous to this, her hemoglobin A1c was at target, at 6.5%. - We discussed about options for treatment, and I suggested to increase her glipizide and add metformin and, if the sugars are not better after the weekend, to consider discussing with her cardiologist whether the Lasix cannot be changed to another diaphoretic or even stopped. If her sugars improve, we may be able to decrease the Levemir in the future since we are starting metformin:  Patient Instructions  Please continue Levemir 45 units at bedtime. Start Metformin 500 mg 2x a day with meals. Increase Glipizide to 10 mg 2x a day.  On Monday, please contact Dr Sallyanne Kuster for further instruction about Lasix if the sugars are still high.  Please let me know if the sugars are consistently <80 or >200.  - Strongly advised her to start checking sugars at different times of the day - check 2 times a day, rotating checks - given sugar log and advised how to fill it and to bring it at next appt  - given foot care handout and explained the principles  - given instructions for hypoglycemia management "15-15 rule"  - advised for yearly eye exams. She is up-to-date. - Return to clinic in 1.5 mo with sugar log   - time spent with the patient: 1 hour, of which >50% was spent in obtaining information about her to conditions: Thyroid nodules and type 2 diabetes, reviewing her previous labs, imaging reports, evaluations, and treatments, counseling her about her conditions (please see the discussed topics above), and developing a plan to further investigate her thyroid nodules; she had a number of questions which I addressed.  Office Visit on  09/17/2014  Component Date Value Ref Range Status  . TSH 09/17/2014 0.57  0.35 - 4.50 uIU/mL Final  . Free T4 09/17/2014 0.82  0.60 - 1.60 ng/dL Final  . T3, Free 09/17/2014 3.7  2.3 - 4.2 pg/mL Final   TFTs normal, but TSH persistently in the low-normal range. We'll continue with a thyroid uptake and scan, as planned.  I will addend the results of the thyroid uptake and scan when they become available.

## 2014-09-17 NOTE — Patient Instructions (Signed)
Please continue Levemir 45 units at bedtime. Start Metformin 500 mg 2x a day with meals. Increase Glipizide to 10 mg 2x a day.  On Monday, please contact Dr Sallyanne Kuster for further instruction about Lasix if the sugars are still high.  Please let me know if the sugars are consistently <80 or >200.  Please schedule a thyroid Uptake and scan with Annasha.  Please stop at the lab.  PATIENT INSTRUCTIONS FOR TYPE 2 DIABETES:  **Please join MyChart!** - see attached instructions about how to join if you have not done so already.  DIET AND EXERCISE Diet and exercise is an important part of diabetic treatment.  We recommended aerobic exercise in the form of brisk walking (working between 40-60% of maximal aerobic capacity, similar to brisk walking) for 150 minutes per week (such as 30 minutes five days per week) along with 3 times per week performing 'resistance' training (using various gauge rubber tubes with handles) 5-10 exercises involving the major muscle groups (upper body, lower body and core) performing 10-15 repetitions (or near fatigue) each exercise. Start at half the above goal but build slowly to reach the above goals. If limited by weight, joint pain, or disability, we recommend daily walking in a swimming pool with water up to waist to reduce pressure from joints while allow for adequate exercise.    BLOOD GLUCOSES Monitoring your blood glucoses is important for continued management of your diabetes. Please check your blood glucoses 2-4 times a day: fasting, before meals and at bedtime (you can rotate these measurements - e.g. one day check before the 3 meals, the next day check before 2 of the meals and before bedtime, etc.).   HYPOGLYCEMIA (low blood sugar) Hypoglycemia is usually a reaction to not eating, exercising, or taking too much insulin/ other diabetes drugs.  Symptoms include tremors, sweating, hunger, confusion, headache, etc. Treat IMMEDIATELY with 15 grams of Carbs: . 4  glucose tablets .  cup regular juice/soda . 2 tablespoons raisins . 4 teaspoons sugar . 1 tablespoon honey Recheck blood glucose in 15 mins and repeat above if still symptomatic/blood glucose <100.  RECOMMENDATIONS TO REDUCE YOUR RISK OF DIABETIC COMPLICATIONS: * Take your prescribed MEDICATION(S) * Follow a DIABETIC diet: Complex carbs, fiber rich foods, (monounsaturated and polyunsaturated) fats * AVOID saturated/trans fats, high fat foods, >2,300 mg salt per day. * EXERCISE at least 5 times a week for 30 minutes or preferably daily.  * DO NOT SMOKE OR DRINK more than 1 drink a day. * Check your FEET every day. Do not wear tightfitting shoes. Contact us if you develop an ulcer * See your EYE doctor once a year or more if needed * Get a FLU shot once a year * Get a PNEUMONIA vaccine once before and once after age 28 years  GOALS:  * Your Hemoglobin A1c of <7%  * fasting sugars need to be <130 * after meals sugars need to be <180 (2h after you start eating) * Your Systolic BP should be 536 or lower  * Your Diastolic BP should be 80 or lower  * Your HDL (Good Cholesterol) should be 40 or higher  * Your LDL (Bad Cholesterol) should be 100 or lower. * Your Triglycerides should be 150 or lower  * Your Urine microalbumin (kidney function) should be <30 * Your Body Mass Index should be 25 or lower    Please consider the following ways to cut down carbs and fat and increase fiber and micronutrients in your  diet: - substitute whole grain for white bread or pasta - substitute brown rice for white rice - substitute 90-calorie flat bread pieces for slices of bread when possible - substitute sweet potatoes or yams for white potatoes - substitute humus for margarine - substitute tofu for cheese when possible - substitute almond or rice milk for regular milk (would not drink soy milk daily due to concern for soy estrogen influence on breast cancer risk) - substitute dark chocolate for other  sweets when possible - substitute water - can add lemon or orange slices for taste - for diet sodas (artificial sweeteners will trick your body that you can eat sweets without getting calories and will lead you to overeating and weight gain in the long run) - do not skip breakfast or other meals (this will slow down the metabolism and will result in more weight gain over time)  - can try smoothies made from fruit and almond/rice milk in am instead of regular breakfast - can also try old-fashioned (not instant) oatmeal made with almond/rice milk in am - order the dressing on the side when eating salad at a restaurant (pour less than half of the dressing on the salad) - eat as little meat as possible - can try juicing, but should not forget that juicing will get rid of the fiber, so would alternate with eating raw veg./fruits or drinking smoothies - use as little oil as possible, even when using olive oil - can dress a salad with a mix of balsamic vinegar and lemon juice, for e.g. - use agave nectar, stevia sugar, or regular sugar rather than artificial sweateners - steam or broil/roast veggies  - snack on veggies/fruit/nuts (unsalted, preferably) when possible, rather than processed foods - reduce or eliminate aspartame in diet (it is in diet sodas, chewing gum, etc) Read the labels!  Try to read Dr. Janene Harvey book: "Program for Reversing Diabetes" for other ideas for healthy eating.

## 2014-09-18 ENCOUNTER — Telehealth: Payer: Self-pay | Admitting: Physician Assistant

## 2014-09-18 NOTE — Telephone Encounter (Signed)
[name] called because she had been put on Lasix and her blood sugars had been running higher than usual, up to 400. She had recently seen her endocrinologist who requested that she follow-up with cardiology to see about changing her diuretic.  [name] has completed the week of daily Lasix and is now taking it 3 times a week. She is doing well from a volume standpoint.  Advised her that I felt she would benefit most from continuing the Lasix until Dr. Sallyanne Kuster can be asked about changing her medications. I advised her that although her blood sugars were higher than ideal, but did not feel like it would be harmful to her over the next 48 hours. Her next Lasix dose is due on Monday.   I will route this note to Dr. Sallyanne Kuster and ask him to please address this issue with her. The patient was agreeable to this plan.

## 2014-09-20 ENCOUNTER — Other Ambulatory Visit: Payer: Self-pay

## 2014-09-20 ENCOUNTER — Telehealth: Payer: Self-pay | Admitting: Cardiovascular Disease

## 2014-09-20 DIAGNOSIS — N838 Other noninflammatory disorders of ovary, fallopian tube and broad ligament: Secondary | ICD-10-CM

## 2014-09-20 MED ORDER — HYDROCODONE-ACETAMINOPHEN 5-325 MG PO TABS: 1.0000 | ORAL_TABLET | Freq: Three times a day (TID) | ORAL | Status: DC | PRN

## 2014-09-20 NOTE — Telephone Encounter (Signed)
Patient is aware of below results of PET scan. Patient is requesting Norco. Patient reports Mateo Flow prescribed Norco because patient is allergic to Tramadol and Tylenol 3 did not help. Patinet needs 1 refill of Norco until she can get to the GYN referral appointment on 10/06/14.

## 2014-09-20 NOTE — Telephone Encounter (Signed)
I agree, she needs to continue Lasix. The dose is low and there is no good alternative agent that would have less impact on glucose control.

## 2014-09-20 NOTE — Telephone Encounter (Signed)
Staff message sent to Dr. Sallyanne Kuster concerning pt.s concerns

## 2014-09-20 NOTE — Telephone Encounter (Signed)
[name] is calling because she went to her Endorologist and she wanted her to see if Dr. Sallyanne Kuster could change her medication from lasik to something else because the lasik was interfering with her blood sugar , Please call   Thanks

## 2014-09-21 NOTE — Telephone Encounter (Signed)
Patient aware of prescription for Norco at front desk to be signed out.

## 2014-09-23 NOTE — Telephone Encounter (Signed)
Please call patient and inform her that cardiology wants her to continue Lasix. If she is still running high, have her to make a nurse appointment and I will adjust her medications when I see her cbg log. Thanks

## 2014-09-27 ENCOUNTER — Encounter (HOSPITAL_COMMUNITY): Payer: BC Managed Care – PPO

## 2014-09-28 ENCOUNTER — Encounter (HOSPITAL_COMMUNITY): Payer: BC Managed Care – PPO

## 2014-09-28 ENCOUNTER — Ambulatory Visit: Payer: BC Managed Care – PPO | Attending: Internal Medicine | Admitting: Internal Medicine

## 2014-09-28 ENCOUNTER — Encounter: Payer: Self-pay | Admitting: Internal Medicine

## 2014-09-28 ENCOUNTER — Other Ambulatory Visit (HOSPITAL_COMMUNITY)
Admission: RE | Admit: 2014-09-28 | Discharge: 2014-09-28 | Disposition: A | Discharge status: Home / Self Care | Payer: BC Managed Care – PPO | Source: Ambulatory Visit | Attending: Internal Medicine | Admitting: Internal Medicine

## 2014-09-28 VITALS — BP 104/71 | HR 68 | Temp 99.1°F | Resp 16 | Ht 63.0 in | Wt 264.8 lb

## 2014-09-28 DIAGNOSIS — N760 Acute vaginitis: Secondary | ICD-10-CM | POA: Insufficient documentation

## 2014-09-28 DIAGNOSIS — E1142 Type 2 diabetes mellitus with diabetic polyneuropathy: Secondary | ICD-10-CM | POA: Diagnosis not present

## 2014-09-28 DIAGNOSIS — Z794 Long term (current) use of insulin: Secondary | ICD-10-CM | POA: Diagnosis not present

## 2014-09-28 DIAGNOSIS — Z113 Encounter for screening for infections with a predominantly sexual mode of transmission: Secondary | ICD-10-CM | POA: Insufficient documentation

## 2014-09-28 DIAGNOSIS — N898 Other specified noninflammatory disorders of vagina: Secondary | ICD-10-CM | POA: Insufficient documentation

## 2014-09-28 LAB — GLUCOSE, POCT (MANUAL RESULT ENTRY): POC Glucose: 242 mg/dl — AB (ref 70–99)

## 2014-09-28 NOTE — Patient Instructions (Signed)
Just for your information, [name] is what we talked about that it could possibly be.    Bacterial Vaginosis Bacterial vaginosis is a vaginal infection that occurs when the normal balance of bacteria in the vagina is disrupted. It results from an overgrowth of certain bacteria. This is the most common vaginal infection in women of childbearing age. Treatment is important to prevent complications, especially in pregnant women, as it can cause a premature delivery. CAUSES  Bacterial vaginosis is caused by an increase in harmful bacteria that are normally present in smaller amounts in the vagina. Several different kinds of bacteria can cause bacterial vaginosis. However, the reason that the condition develops is not fully understood. RISK FACTORS Certain activities or behaviors can put you at an increased risk of developing bacterial vaginosis, including:  Having a new sex partner or multiple sex partners.  Douching.  Using an intrauterine device (IUD) for contraception. Women do not get bacterial vaginosis from toilet seats, bedding, swimming pools, or contact with objects around them. SIGNS AND SYMPTOMS  Some women with bacterial vaginosis have no signs or symptoms. Common symptoms include:  Grey vaginal discharge.  A fishlike odor with discharge, especially after sexual intercourse.  Itching or burning of the vagina and vulva.  Burning or pain with urination. DIAGNOSIS  Your health care provider will take a medical history and examine the vagina for signs of bacterial vaginosis. A sample of vaginal fluid may be taken. Your health care provider will look at this sample under a microscope to check for bacteria and abnormal cells. A vaginal pH test may also be done.  TREATMENT  Bacterial vaginosis may be treated with antibiotic medicines. These may be given in the form of a pill or a vaginal cream. A second round of antibiotics may be prescribed if the condition comes back after treatment.    HOME CARE INSTRUCTIONS   Only take over-the-counter or prescription medicines as directed by your health care provider.  If antibiotic medicine was prescribed, take it as directed. Make sure you finish it even if you start to feel better.  Do not have sex until treatment is completed.  Tell all sexual partners that you have a vaginal infection. They should see their health care provider and be treated if they have problems, such as a mild rash or itching.  Practice safe sex by using condoms and only having one sex partner. SEEK MEDICAL CARE IF:   Your symptoms are not improving after 3 days of treatment.  You have increased discharge or pain.  You have a fever. MAKE SURE YOU:   Understand these instructions.  Will watch your condition.  Will get help right away if you are not doing well or get worse. FOR MORE INFORMATION  Centers for Disease Control and Prevention, Division of STD Prevention: [employer]raiser[employer]raud[employer] American Sexual Health Association (ASHA): www.ashastd.org  Document Released: 02/26/2005 Document Revised: 12/17/2012 Document Reviewed: 10/08/2012 [facility] Patient Information 2015 [place], [place]. This information is not intended to replace advice given to you by your health care provider. Make sure you discuss any questions you have with your health care provider.

## 2014-09-28 NOTE — Progress Notes (Signed)
Patient here for brown vaginal discharge. Patient reports the discharge is thin, brown like dry blood and has an unpleasant odor. Patient reports she has been having the discharge for 1 week now. Patient states she had a cyst in her groin area that burst and is stilling draining. Patient denies any itching. Patient CBG is 242. Patient has taken medications for today and does not need refills.

## 2014-09-28 NOTE — Progress Notes (Signed)
Patient ID: [name], female   DOB: [birth year], 54 y.o.   MRN: [id number]  CC: vaginal discharge   HPI: [name] is a 54 y.o. female here today for a follow up visit.  Patient has past medical history of diabetes mellitus, HTN, depression, and sleep apnea. Patient here for brown vaginal discharge. Patient reports the discharge is thin and brown like dry blood and has an unpleasant odor. Patient reports she has been having the discharge for 1 week now. Patient states she had a cyst in her groin area that burst and is stilling draining. Patient denies any itching. Patient CBG is 242. Patient has taken medications for today and does not need refills. Has a period last month and before that 6 months prior was LMP. She is now being managed by Endocrinology for her Diabetes and thyroid nodules.    Allergies  Allergen Reactions  . Metformin And Related Nausea Only    Hands and feet tingling  . Tramadol Nausea Only   Past Medical History  Diagnosis Date  . Abscess   . Sleep difficulties   . Fatigue   . Sinus problem   . Encounter for drainage of abscess     increased drainage from abscess on buttock  . Abscess of buttock   . Hypertension   . Anxiety   . Dysrhythmia     approx 3 years ago, increased heart rate,  . Depression     sees [name]  . Sleep apnea     2008- sleep study, neg. for sleep apnea   . Anal fistula   . Diabetes mellitus without complication    Current Outpatient Prescriptions on File Prior to Visit  Medication Sig Dispense Refill  . acetaminophen-codeine (TYLENOL #3) 300-30 MG per tablet Take 1 tablet by mouth every 8 (eight) hours as needed for moderate pain. (Patient not taking: Reported on 09/17/2014) 30 tablet 0  . atorvastatin (LIPITOR) 20 MG tablet Take 20 mg by mouth at bedtime.    . diazepam (VALIUM) 2 MG tablet Take 1 tablet (2 mg total) by mouth every 12 (twelve) hours as needed for anxiety. 60 tablet 1  . ferrous sulfate 325 (65 FE) MG  tablet Take 1 tablet (325 mg total) by mouth 2 (two) times daily with a meal. 60 tablet 2  . FLUOXETINE HCL PO Take 40 mg by mouth.     . furosemide (LASIX) 40 MG tablet Take 1 tablet (40 mg total) by mouth daily. For 3 days. Then take 1 tablet (40mg  total) by mouth 3 (three) times a week. 90 tablet 1  . gabapentin (NEURONTIN) 100 MG capsule Take 1 capsule (100 mg total) by mouth 3 (three) times daily. For diabetic nerve pain 90 capsule 3  . glipiZIDE (GLUCOTROL) 5 MG tablet Take 2 tablets (10 mg total) by mouth 2 (two) times daily before a meal. 120 tablet 1  . HYDROcodone-acetaminophen (NORCO/VICODIN) 5-325 MG per tablet Take 1 tablet by mouth every 8 (eight) hours as needed for moderate pain. 30 tablet 0  . insulin detemir (LEVEMIR) 100 UNIT/ML injection Inject 0.45 mLs (45 Units total) into the skin at bedtime. 10 mL 4  . lisinopril-hydrochlorothiazide (PRINZIDE,ZESTORETIC) 10-12.5 MG per tablet Take 1 tablet by mouth daily. 90 tablet 3  . metFORMIN (GLUCOPHAGE) 500 MG tablet Take 1 tablet (500 mg total) by mouth 2 (two) times daily with a meal. 90 tablet 3  . metoprolol tartrate (LOPRESSOR) 25 MG tablet One po bid 60 tablet 3  .  omeprazole (PRILOSEC) 20 MG capsule Take 1 capsule (20 mg total) by mouth daily. 30 capsule 3   No current facility-administered medications on file prior to visit.   Family History  Problem Relation Age of Onset  . Hypertension Mother   . Alzheimer's disease Mother   . Diabetes Father   . Anesthesia problems Neg Hx   . Hypotension Neg Hx   . Malignant hyperthermia Neg Hx   . Pseudochol deficiency Neg Hx    History   Social History  . Marital Status: Divorced    Spouse Name: N/A  . Number of Children: N/A  . Years of Education: N/A   Occupational History  . Not on file.   Social History Main Topics  . Smoking status: Never Smoker   . Smokeless tobacco: Never Used  . Alcohol Use: No  . Drug Use: No  . Sexual Activity: Yes   Other Topics Concern   . Not on file   Social History Narrative    Review of Systems: See HPI   Objective:   Filed Vitals:   09/28/14 1707  BP: 104/71  Pulse: 68  Temp: 99.1 F (37.3 C)  Resp: 16    Physical Exam  Genitourinary: Vagina normal and uterus normal. Cervix exhibits no motion tenderness, no discharge and no friability. Right adnexum displays no tenderness. Left adnexum displays no tenderness.  Lymphadenopathy:       Right: No inguinal adenopathy present.       Left: No inguinal adenopathy present.     Lab Results  Component Value Date   WBC 11.5* 07/02/2014   HGB 10.2* 07/02/2014   HCT 33.9* 07/02/2014   MCV 71.1* 07/02/2014   PLT 404* 07/02/2014   Lab Results  Component Value Date   CREATININE 1.28* 08/23/2014   BUN 28* 08/23/2014   NA 136 08/23/2014   K 3.9 08/23/2014   CL 105 08/23/2014   CO2 22 08/23/2014    Lab Results  Component Value Date   HGBA1C 6.50 08/23/2014   Lipid Panel  No results found for: CHOL, TRIG, HDL, CHOLHDL, VLDL, LDLCALC     Assessment and plan:   [name] was seen today for vaginal discharge.  Diagnoses and all orders for this visit:  Vaginal discharge Orders: -     Cervicovaginal ancillary only No discharge seen on exam today. I have explained to patient that it may be old blood since she is perimenopausal or bacterial vaginosis. I will call her with results.   Type 2 diabetes mellitus with diabetic polyneuropathy Orders: -     Glucose (CBG) Continue follow up with Endocrinology  May follow up as needed or if symptoms do not improve       Lance Bosch, [facility] [phone] 09/28/2014, 5:08 PM

## 2014-09-30 LAB — CERVICOVAGINAL ANCILLARY ONLY
Chlamydia: NEGATIVE
NEISSERIA GONORRHEA: NEGATIVE
Wet Prep (BD Affirm): NEGATIVE

## 2014-10-01 ENCOUNTER — Telehealth: Payer: Self-pay

## 2014-10-01 NOTE — Telephone Encounter (Signed)
-----   Message from Lance Bosch, NP sent at 09/30/2014  3:04 PM EDT ----- Pelvic exam is normal

## 2014-10-01 NOTE — Telephone Encounter (Signed)
Nurse called patient, patient verified date of birth. Patient aware of normal pelvic exam. Patient voices understanding and has no further questions at this time.

## 2014-10-06 ENCOUNTER — Encounter (HOSPITAL_COMMUNITY): Payer: Self-pay

## 2014-10-06 ENCOUNTER — Emergency Department (HOSPITAL_COMMUNITY)
Admission: EM | Admit: 2014-10-06 | Discharge: 2014-10-06 | Disposition: A | Discharge status: Home / Self Care | Payer: BC Managed Care – PPO | Attending: Emergency Medicine | Admitting: Emergency Medicine

## 2014-10-06 ENCOUNTER — Emergency Department (HOSPITAL_COMMUNITY): Payer: BC Managed Care – PPO

## 2014-10-06 ENCOUNTER — Encounter: Payer: Self-pay | Admitting: Obstetrics & Gynecology

## 2014-10-06 ENCOUNTER — Ambulatory Visit (INDEPENDENT_AMBULATORY_CARE_PROVIDER_SITE_OTHER): Payer: BC Managed Care – PPO | Admitting: Obstetrics & Gynecology

## 2014-10-06 VITALS — BP 130/69 | HR 72 | Temp 98.5°F | Ht 62.0 in

## 2014-10-06 DIAGNOSIS — Z79899 Other long term (current) drug therapy: Secondary | ICD-10-CM | POA: Diagnosis not present

## 2014-10-06 DIAGNOSIS — Z872 Personal history of diseases of the skin and subcutaneous tissue: Secondary | ICD-10-CM | POA: Insufficient documentation

## 2014-10-06 DIAGNOSIS — Z0000 Encounter for general adult medical examination without abnormal findings: Secondary | ICD-10-CM | POA: Diagnosis not present

## 2014-10-06 DIAGNOSIS — Z8709 Personal history of other diseases of the respiratory system: Secondary | ICD-10-CM | POA: Insufficient documentation

## 2014-10-06 DIAGNOSIS — M5441 Lumbago with sciatica, right side: Secondary | ICD-10-CM | POA: Diagnosis not present

## 2014-10-06 DIAGNOSIS — F329 Major depressive disorder, single episode, unspecified: Secondary | ICD-10-CM | POA: Insufficient documentation

## 2014-10-06 DIAGNOSIS — F419 Anxiety disorder, unspecified: Secondary | ICD-10-CM | POA: Insufficient documentation

## 2014-10-06 DIAGNOSIS — I10 Essential (primary) hypertension: Secondary | ICD-10-CM | POA: Diagnosis not present

## 2014-10-06 DIAGNOSIS — Z8719 Personal history of other diseases of the digestive system: Secondary | ICD-10-CM | POA: Diagnosis not present

## 2014-10-06 DIAGNOSIS — N898 Other specified noninflammatory disorders of vagina: Secondary | ICD-10-CM | POA: Insufficient documentation

## 2014-10-06 DIAGNOSIS — E119 Type 2 diabetes mellitus without complications: Secondary | ICD-10-CM | POA: Insufficient documentation

## 2014-10-06 DIAGNOSIS — R52 Pain, unspecified: Secondary | ICD-10-CM

## 2014-10-06 DIAGNOSIS — N839 Noninflammatory disorder of ovary, fallopian tube and broad ligament, unspecified: Secondary | ICD-10-CM

## 2014-10-06 DIAGNOSIS — M545 Low back pain: Secondary | ICD-10-CM | POA: Diagnosis present

## 2014-10-06 DIAGNOSIS — N838 Other noninflammatory disorders of ovary, fallopian tube and broad ligament: Secondary | ICD-10-CM

## 2014-10-06 MED ORDER — HYDROCODONE-ACETAMINOPHEN 5-325 MG PO TABS: 1.0000 | ORAL_TABLET | Freq: Four times a day (QID) | ORAL | Status: DC | PRN

## 2014-10-06 NOTE — Discharge Instructions (Signed)
Abdominal Pain Many things can cause abdominal pain. Usually, abdominal pain is not caused by a disease and will improve without treatment. It can often be observed and treated at home. Your health care provider will do a physical exam and possibly order blood tests and X-rays to help determine the seriousness of your pain. However, in many cases, more time must pass before a clear cause of the pain can be found. Before that point, your health care provider may not know if you need more testing or further treatment. HOME CARE INSTRUCTIONS  Monitor your abdominal pain for any changes. The following actions may help to alleviate any discomfort you are experiencing:  Only take over-the-counter or prescription medicines as directed by your health care provider.  Do not take laxatives unless directed to do so by your health care provider.  Try a clear liquid diet (broth, tea, or water) as directed by your health care provider. Slowly move to a bland diet as tolerated. SEEK MEDICAL CARE IF:  You have unexplained abdominal pain.  You have abdominal pain associated with nausea or diarrhea.  You have pain when you urinate or have a bowel movement.  You experience abdominal pain that wakes you in the night.  You have abdominal pain that is worsened or improved by eating food.  You have abdominal pain that is worsened with eating fatty foods.  You have a fever. SEEK IMMEDIATE MEDICAL CARE IF:   Your pain does not go away within 2 hours.  You keep throwing up (vomiting).  Your pain is felt only in portions of the abdomen, such as the right side or the left lower portion of the abdomen.  You pass bloody or black tarry stools. MAKE SURE YOU:  Understand these instructions.   Will watch your condition.   Will get help right away if you are not doing well or get worse.  Document Released: 12/06/2004 Document Revised: 03/03/2013 Document Reviewed: 11/05/2012 [facility] Patient Information  2015 [place], [place]. This information is not intended to replace advice given to you by your health care provider. Make sure you discuss any questions you have with your health care provider.  Please follow-up with your primary care provider inform them of your visit and all relevant data. Please contact her OB/GYN and inform them of recent pelvic ultrasound results and need for further evaluation. Please monitor for new or worsening signs or symptoms return immediately if any present.

## 2014-10-06 NOTE — ED Notes (Signed)
Patiaent states she has had low back pain since February 2016 and has been to several physicians, but no relief from back pain. Patient states the back pain radiates down both legs.

## 2014-10-06 NOTE — ED Notes (Signed)
Bed: PT47 Expected date:  Expected time:  Means of arrival:  Comments: Hold TR 5

## 2014-10-06 NOTE — ED Provider Notes (Signed)
CSN: [id number]     Arrival date & time 10/06/14  1610 History   First MD Initiated Contact with Patient 10/06/14 1821     Chief Complaint  Patient presents with  . Vaginal Discharge  . Back Pain    HPI   54 year old female patient presents today with complaints of right lower abdominal pain and back pain. Patient reports she's had back pain for the past 6 months, reports it's worse with ambulation with radiation of sharp in nature to her right posterior leg down to her knee. Patient denies numbness tingling or focal neural or motor weaknesses in the extremity. Patient was prescribed Norco previously for abdominal pain, this improved her back pain. Patient also reports right lower abdominal pain, this has been present for around the same time as her back pain, she's had significant workup including CT abdomen and pelvis, ultrasound abdomen, PET scan. She is being followed by her OB/GYN with multiple visits there for the pain and vaginal discharge. She most recently seen them today and had a ultrasound scheduled for August 7. Patient reports that she was not prescribed any pain medication, and is worried about the abdominal pathology. Patient denies fever, chills, nausea or vomiting, upper abdominal pain, weight loss. Patient has no red flags for back pain.    Past Medical History  Diagnosis Date  . Abscess   . Sleep difficulties   . Fatigue   . Sinus problem   . Encounter for drainage of abscess     increased drainage from abscess on buttock  . Abscess of buttock   . Hypertension   . Anxiety   . Dysrhythmia     approx 3 years ago, increased heart rate,  . Depression     sees [name]  . Sleep apnea     2008- sleep study, neg. for sleep apnea   . Anal fistula   . Diabetes mellitus without complication    Past Surgical History  Procedure Laterality Date  . Knee arthroscopy      left  . Shoulder surgery  04/14/09    right  . Incise and drain abcess      abscess on right thigh  and buttock  . Anal examination under anesthesia  02/21/11    anal fistula  . Breast surgery  patient does not remember date of procedure    pull fluid off lft br   Family History  Problem Relation Age of Onset  . Hypertension Mother   . Alzheimer's disease Mother   . Diabetes Father   . Anesthesia problems Neg Hx   . Hypotension Neg Hx   . Malignant hyperthermia Neg Hx   . Pseudochol deficiency Neg Hx    History  Substance Use Topics  . Smoking status: Never Smoker   . Smokeless tobacco: Never Used  . Alcohol Use: No   OB History    Gravida Para Term Preterm AB TAB SAB Ectopic Multiple Living   1 1  1            Review of Systems  All other systems reviewed and are negative.   Allergies  Metformin and related and Tramadol  Home Medications   Prior to Admission medications   Medication Sig Start Date End Date Taking? Authorizing Provider  atorvastatin (LIPITOR) 20 MG tablet Take 20 mg by mouth at bedtime.   Yes Historical Provider, MD  diazepam (VALIUM) 2 MG tablet Take 1 tablet (2 mg total) by mouth every 12 (twelve) hours as  needed for anxiety. 08/23/14  Yes Lance Bosch, NP  ferrous sulfate 325 (65 FE) MG tablet Take 1 tablet (325 mg total) by mouth 2 (two) times daily with a meal. 06/03/14  Yes Lance Bosch, NP  FLUoxetine (PROZAC) 40 MG capsule Take 40 mg by mouth daily.   Yes Historical Provider, MD  furosemide (LASIX) 40 MG tablet Take 1 tablet (40 mg total) by mouth daily. For 3 days. Then take 1 tablet (40mg  total) by mouth 3 (three) times a week. Patient taking differently: Take 40 mg by mouth 3 (three) times daily. MONWEDFRI 09/09/14  Yes Mihai Croitoru, MD  glipiZIDE (GLUCOTROL) 5 MG tablet Take 2 tablets (10 mg total) by mouth 2 (two) times daily before a meal. 09/17/14  Yes Philemon Kingdom, MD  lisinopril-hydrochlorothiazide (PRINZIDE,ZESTORETIC) 10-12.5 MG per tablet Take 1 tablet by mouth daily. 07/20/14  Yes Lance Bosch, NP  metFORMIN (GLUCOPHAGE) 500  MG tablet Take 1 tablet (500 mg total) by mouth 2 (two) times daily with a meal. 09/17/14  Yes Philemon Kingdom, MD  metoprolol tartrate (LOPRESSOR) 25 MG tablet One po bid Patient taking differently: Take 25 mg by mouth 2 (two) times daily.  08/23/14  Yes Lance Bosch, NP  omeprazole (PRILOSEC) 20 MG capsule Take 1 capsule (20 mg total) by mouth daily. 08/23/14  Yes Lance Bosch, NP  acetaminophen-codeine (TYLENOL #3) 300-30 MG per tablet Take 1 tablet by mouth every 8 (eight) hours as needed for moderate pain. Patient not taking: Reported on 09/28/2014 08/24/14   Lance Bosch, NP  gabapentin (NEURONTIN) 100 MG capsule Take 1 capsule (100 mg total) by mouth 3 (three) times daily. For diabetic nerve pain Patient not taking: Reported on 10/06/2014 08/23/14   Lance Bosch, NP  HYDROcodone-acetaminophen (NORCO/VICODIN) 5-325 MG per tablet Take 1 tablet by mouth every 6 (six) hours as needed. 10/06/14   Okey Regal, PA-C  insulin detemir (LEVEMIR) 100 UNIT/ML injection Inject 0.45 mLs (45 Units total) into the skin at bedtime. Patient not taking: Reported on 09/28/2014 08/23/14   Lance Bosch, NP   BP 92/66 mmHg  Pulse 69  Temp(Src) 98.4 F (36.9 C) (Oral)  Resp 16  SpO2 98%  LMP 09/06/2014   Physical Exam  Constitutional: She is oriented to person, place, and time. She appears well-developed and well-nourished.  HENT:  Head: Normocephalic and atraumatic.  Eyes: Pupils are equal, round, and reactive to light.  Neck: Normal range of motion. Neck supple. No JVD present. No tracheal deviation present. No thyromegaly present.  Cardiovascular: Normal rate, regular rhythm, normal heart sounds and intact distal pulses.  Exam reveals no gallop and no friction rub.   No murmur heard. Pulmonary/Chest: Effort normal and breath sounds normal. No stridor. No respiratory distress. She has no wheezes. She has no rales. She exhibits no tenderness.  Abdominal: Soft. Bowel sounds are normal.  Right lower  abdomen and pelvic pain to palpation, no guarding, rebound, masses felt  Musculoskeletal: Normal range of motion.  Tenderness to palpation of the right lower paralumbar and superior gluteus  Lymphadenopathy:    She has no cervical adenopathy.  Neurological: She is alert and oriented to person, place, and time. Coordination normal.  Skin: Skin is warm and dry.  Psychiatric: She has a normal mood and affect. Her behavior is normal. Judgment and thought content normal.  Nursing note and vitals reviewed.   ED Course  Procedures (including critical care time) Labs Review Labs Reviewed - No  data to display  Imaging Review US Transvaginal Non-ob  10/06/2014   CLINICAL DATA:  Six-month history of pelvic pain. Uterine and right ovarian lesions seen on recent CT  EXAM: TRANSABDOMINAL AND TRANSVAGINAL ULTRASOUND OF PELVIS  TECHNIQUE: Study was performed transabdominally to optimize pelvic field of view evaluation and transvaginally to optimize internal visceral architecture evaluation.  COMPARISON:  CT abdomen and pelvis August 30, 2014  FINDINGS: Uterus  Measurements: 9.2 x 5.6 x 5.4 cm. There is a subserosal appearing hypoechoic mass arising from the posterior mid uterus measuring 5.9 x 4.0 x 5.3 cm. Along the superior aspect of the uterus, there is a hypoechoic subserosal mass measuring 1.7 x 1.8 x 1.5. Both of these lesions are felt to represent subserosal leiomyomas. The smaller leiomyoma contains foci of calcification. There are several small nabothian cysts in the cervix.  Endometrium  Thickness: 3 mm.  No focal abnormality visualized.  Right ovary  Not seen by either transabdominal or transvaginal technique.  Left ovary  Not seen by either transabdominal or transvaginal technique.  Other findings  No free fluid.  IMPRESSION: Subserosal uterine leiomyomas, largest measuring 5.9 x 4.0 x 5.3 cm. Several small nabothian cysts arising from cervix.  Neither ovary could be visualized by transabdominal or  transvaginal technique. The mass arising from right ovary noted on CT 1 month prior cannot be assessed with this study. Given concern for right ovarian mass, pelvic MR correlation may be advisable.   Electronically Signed   By: Lowella Grip III M.D.   On: 10/06/2014 19:42   US Pelvis Complete  10/06/2014   CLINICAL DATA:  Six-month history of pelvic pain. Uterine and right ovarian lesions seen on recent CT  EXAM: TRANSABDOMINAL AND TRANSVAGINAL ULTRASOUND OF PELVIS  TECHNIQUE: Study was performed transabdominally to optimize pelvic field of view evaluation and transvaginally to optimize internal visceral architecture evaluation.  COMPARISON:  CT abdomen and pelvis August 30, 2014  FINDINGS: Uterus  Measurements: 9.2 x 5.6 x 5.4 cm. There is a subserosal appearing hypoechoic mass arising from the posterior mid uterus measuring 5.9 x 4.0 x 5.3 cm. Along the superior aspect of the uterus, there is a hypoechoic subserosal mass measuring 1.7 x 1.8 x 1.5. Both of these lesions are felt to represent subserosal leiomyomas. The smaller leiomyoma contains foci of calcification. There are several small nabothian cysts in the cervix.  Endometrium  Thickness: 3 mm.  No focal abnormality visualized.  Right ovary  Not seen by either transabdominal or transvaginal technique.  Left ovary  Not seen by either transabdominal or transvaginal technique.  Other findings  No free fluid.  IMPRESSION: Subserosal uterine leiomyomas, largest measuring 5.9 x 4.0 x 5.3 cm. Several small nabothian cysts arising from cervix.  Neither ovary could be visualized by transabdominal or transvaginal technique. The mass arising from right ovary noted on CT 1 month prior cannot be assessed with this study. Given concern for right ovarian mass, pelvic MR correlation may be advisable.   Electronically Signed   By: Lowella Grip III M.D.   On: 10/06/2014 19:42     EKG Interpretation None      MDM   Final diagnoses:  Right-sided low back  pain with right-sided sciatica    Labs:  Imaging: Pelvic ultrasound shows uterine leiomyomas; neither ovary could be visualized by ultrasound technique, the mass arising from the right ovary noted on CT 1 month ago cannot be assessed at this study  Consults:  Therapeutics:  Discharge Meds: Hydrocodone  Assessment/Plan: Patient presents with multiple complaints. Patient has right lower quadrant pain and vaginal discharge. She's had significant workups and is being closely followed by OB/GYN for this. I completed her ultrasound here in the ED which was inconclusive for further evaluation of the right mass on the ovary. They recommend pelvic MR. Patient has not had significant changes in her abdominal pain, this is identical to previous presentations, recent vaginal exam completed by OB/GYN without significant findings. Patient instructed follow-up with OB/GYN inform them of the ultrasound results to discuss further evaluation. Patient also reports back pain with radiation down into the right lower leg. This likely represents sciatic pain. She has no red flags, is able to ambulate without difficulty. She was prescribed short course of pain medication, and encouraged follow-up with her primary care provider orthopedic specialist for further evaluation or management. Patient given strict return precautions verbalized understanding and agreement for today's plan and had no further questions or concerns at discharge.         Okey Regal, PA-C 10/07/14 0932  Serita Grit, MD 10/08/14 [phone]

## 2014-10-06 NOTE — Progress Notes (Signed)
   Subjective:    Patient ID: [name], female    DOB: 28-Jun-1960, 54 y.o.   MRN: [id number]  HPI  54 yo DW P0 here today with the complaint of with right lower pelvic pain with radiation to her lower back for the last 6 month. She is using norco for the pain. I told her that we do not prescribe it here. She had a normal abdominal u/s and then a full abd/pelvic u/s was done which showed a 4.9 cm structure ajacent to the right ovary.   Review of Systems  Her pap was negative last year but they did not do HPV testing.    Objective:   Physical Exam Morbidly obese WFNAD Abd- very large, benign EG- no lesions Cervix- appears nulliparous and normal       Assessment & Plan:  CT finding of right adnexal structure- schedule gyn u/s and check CA-125 Pap with cotesting done today Mammogram next month already scheduled.

## 2014-10-06 NOTE — Progress Notes (Signed)
Pt reports having mammogram scheduled on October 13, 2014.

## 2014-10-06 NOTE — ED Notes (Signed)
On arrival to fast track patient informs me that this problem has been on-going since February. And that she has an appointment with her OBGYN next week because they said she needs a vaginal ultrasound to examine for ovarian cysts that could be the cause of her pain. Also complained of a brown vaginal discharge.

## 2014-10-07 LAB — CA 125: CA 125: 7 U/mL (ref <35)

## 2014-10-08 LAB — CYTOLOGY - PAP

## 2014-10-11 ENCOUNTER — Ambulatory Visit: Payer: BC Managed Care – PPO | Admitting: Internal Medicine

## 2014-10-13 ENCOUNTER — Ambulatory Visit (INDEPENDENT_AMBULATORY_CARE_PROVIDER_SITE_OTHER): Payer: BC Managed Care – PPO | Admitting: Cardiovascular Disease

## 2014-10-13 ENCOUNTER — Ambulatory Visit (HOSPITAL_COMMUNITY)
Admission: RE | Admit: 2014-10-13 | Discharge: 2014-10-13 | Disposition: A | Discharge status: Home / Self Care | Payer: BC Managed Care – PPO | Source: Ambulatory Visit | Attending: Internal Medicine | Admitting: Internal Medicine

## 2014-10-13 VITALS — BP 92/58 | HR 78 | Resp 16 | Ht 63.0 in | Wt 265.0 lb

## 2014-10-13 DIAGNOSIS — I471 Supraventricular tachycardia: Secondary | ICD-10-CM

## 2014-10-13 DIAGNOSIS — Z1231 Encounter for screening mammogram for malignant neoplasm of breast: Secondary | ICD-10-CM | POA: Diagnosis present

## 2014-10-13 MED ORDER — LISINOPRIL 10 MG PO TABS: 10.0000 mg | ORAL_TABLET | Freq: Every day | ORAL | Status: DC

## 2014-10-13 NOTE — Patient Instructions (Signed)
Your physician wants you to follow-up in: 6 Month. You will receive a reminder letter in the mail two months in advance. If you don't receive a letter, please call our office to schedule the follow-up appointment.  Your physician has recommended you make the following change in your medication: STOP Lisinopril/HCTZ and START Lisinopril 10 mg daily

## 2014-10-13 NOTE — Progress Notes (Signed)
Patient ID: [name], female   DOB: March 14, 1960, 54 y.o.   MRN: [id number]     Cardiology Office Note   Date:  10/15/2014   ID:  [name], DOB [birth year], MRN [id number]  PCP:  Lance Bosch, NP  Cardiologist:   Sanda Klein, MD   Chief Complaint  Patient presents with  . Follow-up    a little chest pressure and a little dizzy  . Shortness of Breath    a little  . Leg Swelling    lt is swelling more than the rt one      History of Present Illness: [name] is a 54 y.o. female who presents for follow-up for palpitations related to supraventricular tachycardia. She has generally felt quite well and has had at most a very brief palpitations. She remains obese but blood sugar control has improved. Her blood pressure is actually low, although she does not have symptoms of dizziness or lightheadedness.  She was concerned that diuretics would increase her glucose levels. I explained that without exception both thiazide and loop diuretics will do this at the doses she takes are unlikely to cause marked hyperglycemia. After adjustment in her medications with her endocrinologist, has successfully brought her glucose under better control.  The echocardiogram in June was a normal study, there does not appear to be any indication for a cardiac cause of dyspnea.   Past Medical History  Diagnosis Date  . Abscess   . Sleep difficulties   . Fatigue   . Sinus problem   . Encounter for drainage of abscess     increased drainage from abscess on buttock  . Abscess of buttock   . Hypertension   . Anxiety   . Dysrhythmia     approx 3 years ago, increased heart rate,  . Depression     sees [name]  . Sleep apnea     2008- sleep study, neg. for sleep apnea   . Anal fistula   . Diabetes mellitus without complication     Past Surgical History  Procedure Laterality Date  . Knee arthroscopy      left  . Shoulder surgery  04/14/09    right  . Incise and drain  abcess      abscess on right thigh and buttock  . Anal examination under anesthesia  02/21/11    anal fistula  . Breast surgery  patient does not remember date of procedure    pull fluid off lft br     Current Outpatient Prescriptions  Medication Sig Dispense Refill  . atorvastatin (LIPITOR) 20 MG tablet Take 20 mg by mouth at bedtime.    . diazepam (VALIUM) 2 MG tablet Take 1 tablet (2 mg total) by mouth every 12 (twelve) hours as needed for anxiety. 60 tablet 1  . ferrous sulfate 325 (65 FE) MG tablet Take 1 tablet (325 mg total) by mouth 2 (two) times daily with a meal. 60 tablet 2  . FLUoxetine (PROZAC) 40 MG capsule Take 40 mg by mouth daily.    . furosemide (LASIX) 40 MG tablet Take 1 tablet (40 mg total) by mouth daily. For 3 days. Then take 1 tablet (40mg  total) by mouth 3 (three) times a week. (Patient taking differently: Take 40 mg by mouth 3 (three) times daily. MONWEDFRI) 90 tablet 1  . glipiZIDE (GLUCOTROL) 5 MG tablet Take 2 tablets (10 mg total) by mouth 2 (two) times daily before a meal. 120 tablet 1  . insulin detemir (  LEVEMIR) 100 UNIT/ML injection Inject 0.45 mLs (45 Units total) into the skin at bedtime. 10 mL 4  . metFORMIN (GLUCOPHAGE) 500 MG tablet Take 1 tablet (500 mg total) by mouth 2 (two) times daily with a meal. 90 tablet 3  . metoprolol tartrate (LOPRESSOR) 25 MG tablet One po bid (Patient taking differently: Take 25 mg by mouth 2 (two) times daily. ) 60 tablet 3  . omeprazole (PRILOSEC) 20 MG capsule Take 1 capsule (20 mg total) by mouth daily. 30 capsule 3  . lisinopril (PRINIVIL,ZESTRIL) 10 MG tablet Take 1 tablet (10 mg total) by mouth daily. 30 tablet 6   No current facility-administered medications for this visit.    Allergies:   Metformin and related and Tramadol    Social History:  The patient  reports that she has never smoked. She has never used smokeless tobacco. She reports that she does not drink alcohol or use illicit drugs.   Family History:   The patient's family history includes Alzheimer's disease in her mother; Diabetes in her father; Hypertension in her mother. There is no history of Anesthesia problems, Hypotension, Malignant hyperthermia, or Pseudochol deficiency.    ROS:  Please see the history of present illness.    Otherwise, review of systems positive for none.   All other systems are reviewed and negative.    PHYSICAL EXAM: VS:  BP 92/58 mmHg  Pulse 78  Resp 16  Ht 5\' 3"  (1.6 m)  Wt 265 lb (120.203 kg)  BMI 46.95 kg/m2  LMP 09/06/2014 , BMI Body mass index is 46.95 kg/(m^2).  General: Alert, oriented x3, no distress Head: no evidence of trauma, PERRL, EOMI, no exophtalmos or lid lag, no myxedema, no xanthelasma; normal ears, nose and oropharynx Neck: normal jugular venous pulsations and no hepatojugular reflux; brisk carotid pulses without delay and no carotid bruits Chest: clear to auscultation, no signs of consolidation by percussion or palpation, normal fremitus, symmetrical and full respiratory excursions Cardiovascular: normal position and quality of the apical impulse, regular rhythm, normal first and second heart sounds, no  murmurs, rubs or gallops Abdomen: no tenderness or distention, no masses by palpation, no abnormal pulsatility or arterial bruits, normal bowel sounds, no hepatosplenomegaly Extremities: no clubbing, cyanosis or edema; 2+ radial, ulnar and brachial pulses bilaterally; 2+ right femoral, posterior tibial and dorsalis pedis pulses; 2+ left femoral, posterior tibial and dorsalis pedis pulses; no subclavian or femoral bruits Neurological: grossly nonfocal Psych: euthymic mood, full affect  EKG:  EKG is ordered today. Shows normal sinus rhythm at 70 bpm, QTC 442 ms. Normal tracing.  Recent Labs: 07/02/2014: Hemoglobin 10.2*; Platelets 404* 08/23/2014: ALT 10; BUN 28*; Creat 1.28*; Potassium 3.9; Sodium 136 09/17/2014: TSH 0.57     Wt Readings from Last 3 Encounters:  10/13/14 265 lb  (120.203 kg)  09/28/14 264 lb 12.8 oz (120.112 kg)  09/17/14 265 lb (120.203 kg)      Other studies Reviewed: Additional studies/ records that were reviewed today include:  records from her endocrinologist.   ASSESSMENT AND PLAN:  1. Paroxysmal supraventricular tachycardia, almost certainly AV node reentry. Currently symptoms are well controlled. Bradycardia prevented increasing the dose of beta blocker in the past, but today her heart rate is faster. We could try to increase the metoprolol if her palpitations become more of a bother.  2. Exertional dyspnea and lower extremity edema do not appear to be explained by cardiac illness. Her echocardiogram was quite normal and showed no evidence of volume overload. It  is possible that her obesity and renal disease explain the swelling and shortness of breath   3. Essential hypertension, ( excessively )well controlled.Will stop her thiazide diuretic and leave her on lisinopril only, in addition to the metoprolol and furosemide  4.   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 Thyroid nodules   Current medicines are reviewed at length with the patient today.  The patient does not have concerns regarding medicines.  The following changes have been made:  Stop lisinopril-hydrochlorothiazide. Start lisinopril 10 mg daily  Labs/ tests ordered today include:  Orders Placed This Encounter  Procedures  . EKG 12-Lead    Patient Instructions  Your physician wants you to follow-up in: 6 Month. You will receive a reminder letter in the mail two months in advance. If you don't receive a letter, please call our office to schedule the follow-up appointment.  Your physician has recommended you make the following change in your medication: STOP Lisinopril/HCTZ and START Lisinopril 10 mg daily       Signed, Sanda Klein, MD  10/15/2014 5:57 PM     Sanda Klein, MD, [facility] [phone] office [phone] pager

## 2014-10-15 ENCOUNTER — Encounter: Payer: Self-pay | Admitting: Cardiovascular Disease

## 2014-10-18 ENCOUNTER — Encounter: Payer: Self-pay | Admitting: Obstetrics & Gynecology

## 2014-10-18 ENCOUNTER — Telehealth: Payer: Self-pay | Admitting: Internal Medicine

## 2014-10-18 ENCOUNTER — Ambulatory Visit (INDEPENDENT_AMBULATORY_CARE_PROVIDER_SITE_OTHER): Payer: BC Managed Care – PPO | Admitting: Obstetrics & Gynecology

## 2014-10-18 VITALS — BP 133/50 | HR 80 | Temp 98.7°F | Ht 63.0 in | Wt 261.9 lb

## 2014-10-18 DIAGNOSIS — R1030 Lower abdominal pain, unspecified: Secondary | ICD-10-CM | POA: Insufficient documentation

## 2014-10-18 DIAGNOSIS — K603 Anal fistula: Secondary | ICD-10-CM | POA: Diagnosis not present

## 2014-10-18 DIAGNOSIS — D252 Subserosal leiomyoma of uterus: Secondary | ICD-10-CM

## 2014-10-18 DIAGNOSIS — D219 Benign neoplasm of connective and other soft tissue, unspecified: Secondary | ICD-10-CM | POA: Insufficient documentation

## 2014-10-18 NOTE — Telephone Encounter (Signed)
Patient is wanting talk about her results and also is interested in an appointment to get her bp checked. Patient is interested in making an appointment solely with the nurse, if this is possible please speak with a scheduler. Please follow up with the patient for advise.

## 2014-10-18 NOTE — Telephone Encounter (Signed)
-----   Message from Nila Nephew, RN sent at 10/18/2014 12:39 PM EDT -----   ----- Message -----    From: Lance Bosch, NP    Sent: 10/16/2014   9:44 PM      To: Nila Nephew, RN  No evidence of malignancy on mammogram

## 2014-10-18 NOTE — Patient Instructions (Signed)
Return to clinic for any scheduled appointments or for any gynecologic concerns as needed.   

## 2014-10-18 NOTE — Telephone Encounter (Signed)
Called patient. Reached voicemail. Left message stating to return call at [phone].

## 2014-10-18 NOTE — Progress Notes (Signed)
CLINIC ENCOUNTER NOTE  History:  54 y.o. G1P0100 here today for follow up of pelvic mass noted on recent CT scan. Was last seen here on 10/06/14 by Dr. Hulan Fray; had negative pap and negative CA-125. Pelvic ultrasound was ordered. She is here to follow up on results. Reports having continued lower abdominal and pelvic pain related to eating; pain is strongest around 30 minutes after food and make she has to go to the bathroom.  Also reported having a boil near her vulva that has been spontaneously draining for over two weeks; had a history of perianal abscess and anal fistuala s/p I&D by General Surgery (Dr. Marlou Starks) in 2012.  She wants this evaluated. She denies any abnormal vaginal discharge, bleeding or other concerns.   Past Medical History  Diagnosis Date  . Abscess   . Sleep difficulties   . Fatigue   . Sinus problem   . Encounter for drainage of abscess     increased drainage from abscess on buttock  . Abscess of buttock   . Hypertension   . Anxiety   . Dysrhythmia     approx 3 years ago, increased heart rate,  . Depression     sees [name]  . Sleep apnea     2008- sleep study, neg. for sleep apnea   . Anal fistula   . Diabetes mellitus without complication     Past Surgical History  Procedure Laterality Date  . Knee arthroscopy      left  . Shoulder surgery  04/14/09    right  . Incise and drain abcess      abscess on right thigh and buttock  . Anal examination under anesthesia  02/21/11    anal fistula  . Breast surgery  patient does not remember date of procedure    pull fluid off lft br    The following portions of the patient's history were reviewed and updated as appropriate: allergies, current medications, past family history, past medical history, past social history, past surgical history and problem list.   Health Maintenance:  Normal pap and negative HRHPV on 10/06/2014.  Normal mammogram on 10/14/2014.   Review of Systems:  Pertinent items are noted in  HPI. Comprehensive review of systems was otherwise negative.  Objective:  Physical Exam BP 133/50 mmHg  Pulse 80  Temp(Src) 98.7 F (37.1 C) (Oral)  Ht 5\' 3"  (1.6 m)  Wt 261 lb 14.4 oz (118.797 kg)  BMI 46.41 kg/m2  LMP 09/15/2014 CONSTITUTIONAL: Well-developed, well-nourished female in no acute distress.  HENT:  Normocephalic, atraumatic. External right and left ear normal. Oropharynx is clear and moist EYES: Conjunctivae and EOM are normal. Pupils are equal, round, and reactive to light. No scleral icterus.  NECK: Normal range of motion, supple, no masses SKIN: Skin is warm and dry. No rash noted. Not diaphoretic. No erythema. No pallor. [name]: Alert and oriented to person, place, and time. Normal reflexes, muscle tone coordination. No cranial nerve deficit noted. PSYCHIATRIC: Normal mood and affect. Normal behavior. Normal judgment and thought content. CARDIOVASCULAR: Normal heart rate noted RESPIRATORY: Effort and breath sounds normal, no problems with respiration noted ABDOMEN: Soft, obese, no distention noted.   MUSCULOSKELETAL: Normal range of motion. No edema noted. PELVIC: Normal appearing external genitalia.  Area noted as below  Physical Exam  Genitourinary:       Labs and Imaging US Transvaginal Non-ob  10/06/2014   CLINICAL DATA:  Six-month history of pelvic pain. Uterine and right ovarian lesions  seen on recent CT  EXAM: TRANSABDOMINAL AND TRANSVAGINAL ULTRASOUND OF PELVIS  TECHNIQUE: Study was performed transabdominally to optimize pelvic field of view evaluation and transvaginally to optimize internal visceral architecture evaluation.  COMPARISON:  CT abdomen and pelvis August 30, 2014  FINDINGS: Uterus  Measurements: 9.2 x 5.6 x 5.4 cm. There is a subserosal appearing hypoechoic mass arising from the posterior mid uterus measuring 5.9 x 4.0 x 5.3 cm. Along the superior aspect of the uterus, there is a hypoechoic subserosal mass measuring 1.7 x 1.8 x 1.5. Both of  these lesions are felt to represent subserosal leiomyomas. The smaller leiomyoma contains foci of calcification. There are several small nabothian cysts in the cervix.  Endometrium  Thickness: 3 mm.  No focal abnormality visualized.  Right ovary  Not seen by either transabdominal or transvaginal technique.  Left ovary  Not seen by either transabdominal or transvaginal technique.  Other findings  No free fluid.  IMPRESSION: Subserosal uterine leiomyomas, largest measuring 5.9 x 4.0 x 5.3 cm. Several small nabothian cysts arising from cervix.  Neither ovary could be visualized by transabdominal or transvaginal technique. The mass arising from right ovary noted on CT 1 month prior cannot be assessed with this study. Given concern for right ovarian mass, pelvic MR correlation may be advisable.   Electronically Signed   By: Lowella Grip III M.D.   On: 10/06/2014 19:42   US Pelvis Complete  10/06/2014   CLINICAL DATA:  Six-month history of pelvic pain. Uterine and right ovarian lesions seen on recent CT  EXAM: TRANSABDOMINAL AND TRANSVAGINAL ULTRASOUND OF PELVIS  TECHNIQUE: Study was performed transabdominally to optimize pelvic field of view evaluation and transvaginally to optimize internal visceral architecture evaluation.  COMPARISON:  CT abdomen and pelvis August 30, 2014  FINDINGS: Uterus  Measurements: 9.2 x 5.6 x 5.4 cm. There is a subserosal appearing hypoechoic mass arising from the posterior mid uterus measuring 5.9 x 4.0 x 5.3 cm. Along the superior aspect of the uterus, there is a hypoechoic subserosal mass measuring 1.7 x 1.8 x 1.5. Both of these lesions are felt to represent subserosal leiomyomas. The smaller leiomyoma contains foci of calcification. There are several small nabothian cysts in the cervix.  Endometrium  Thickness: 3 mm.  No focal abnormality visualized.  Right ovary  Not seen by either transabdominal or transvaginal technique.  Left ovary  Not seen by either transabdominal or  transvaginal technique.  Other findings  No free fluid.  IMPRESSION: Subserosal uterine leiomyomas, largest measuring 5.9 x 4.0 x 5.3 cm. Several small nabothian cysts arising from cervix.  Neither ovary could be visualized by transabdominal or transvaginal technique. The mass arising from right ovary noted on CT 1 month prior cannot be assessed with this study. Given concern for right ovarian mass, pelvic MR correlation may be advisable.   Electronically Signed   By: Lowella Grip III M.D.   On: 10/06/2014 19:42   Mm Digital Screening Bilateral  10/14/2014   CLINICAL DATA:  Screening.  EXAM: DIGITAL SCREENING BILATERAL MAMMOGRAM WITH CAD  COMPARISON:  Previous exam(s).  ACR Breast Density Category b: There are scattered areas of fibroglandular density.  FINDINGS: There are no findings suspicious for malignancy. Images were processed with CAD.  IMPRESSION: No mammographic evidence of malignancy. A result letter of this screening mammogram will be mailed directly to the patient.  RECOMMENDATION: Screening mammogram in one year. (Code:SM-B-01Y)  BI-RADS CATEGORY  1: Negative.   Electronically Signed   By: Junie Panning  Brigitte Pulse M.D.   On: 10/14/2014 15:15    Assessment & Plan:  Reviewed ultrasound results with patient; reassured about benign nature of fibroid. No surgical intervention needed. Pain is likely of GI etiology; may need referral to GI for further evaluation Perianal area is concerning for possible fistula recurrence; patient advised to follow up with General Surgery Routine preventative health maintenance measures emphasized. Please refer to After Visit Summary for other counseling recommendations.    Theresa Schneiders, [name], [place] Attending [place] for [employer], [place]

## 2014-10-19 ENCOUNTER — Ambulatory Visit (HOSPITAL_COMMUNITY): Payer: BC Managed Care – PPO

## 2014-10-19 ENCOUNTER — Telehealth: Payer: Self-pay | Admitting: Cardiovascular Disease

## 2014-10-19 NOTE — Telephone Encounter (Signed)
Patient c/o feeling short of breath with exertion and feels regular parking is often too far away and she gets short of breath with walking the distance required.  Request handicap placard.  Form filled out.  Needs Dr. Victorino December signature.  Called patient to let her know I would call her when it was ready for her to pick up

## 2014-10-19 NOTE — Telephone Encounter (Signed)
Pt does need a handicap sticker. Her first night of class was difficult,it took about 20 minutes to get her heart rate down after walking to her car.She was caught in the rain and she was rushing to get to her car.

## 2014-10-20 ENCOUNTER — Telehealth: Payer: Self-pay

## 2014-10-20 DIAGNOSIS — R1030 Lower abdominal pain, unspecified: Secondary | ICD-10-CM

## 2014-10-20 NOTE — Telephone Encounter (Signed)
Patient will need to contact ordering provider or GYN for results. GYN notes reports that patient should see GI for abdominal pain because they do not feel it is related to GYN. If she would like you can place referral to GI  She should speak with Cardiology about BP. They can adust meds as needed.

## 2014-10-20 NOTE — Telephone Encounter (Signed)
Patient called, verified date of birth.  Patient reports still having discharge and vaginal bleeding. Patient went to [facility] at [facility] on Monday. They told patient she had fibroids. [facility] she should not be in any pain for this.   Patient also reports BP was dropping when she went to heart doctor last week. It was 92/58.   Patient aware of no evidence of malignancy on mammogram.   Patient looking for results from pelvic scan and vaginal [place].

## 2014-10-22 NOTE — Telephone Encounter (Signed)
Patient called nurse to follow up on previous call. Please follow up.

## 2014-10-25 NOTE — Telephone Encounter (Signed)
Patient called to request a med refill for Norco until she is able to come in to see her PCP. Please f/u

## 2014-10-29 NOTE — Telephone Encounter (Signed)
Nurse called patient, patient verified date of birth. Patient agrees to call GYN for vaginal bleeding. Patient agrees to call GYN for results. Patient agrees to GI referral, nurse will place order. Patient agrees to speak to Cardiology about BP because they can adjust meds as needed.  Patient voices understanding and has no further questions at this time.

## 2014-11-01 ENCOUNTER — Ambulatory Visit: Payer: BC Managed Care – PPO | Admitting: Internal Medicine

## 2014-11-08 ENCOUNTER — Encounter: Payer: Self-pay | Admitting: Internal Medicine

## 2014-11-08 ENCOUNTER — Ambulatory Visit: Payer: BC Managed Care – PPO | Attending: Internal Medicine | Admitting: Internal Medicine

## 2014-11-08 VITALS — BP 126/67 | HR 65 | Temp 98.0°F | Resp 16 | Ht 63.0 in | Wt 263.0 lb

## 2014-11-08 DIAGNOSIS — Z794 Long term (current) use of insulin: Secondary | ICD-10-CM | POA: Diagnosis not present

## 2014-11-08 DIAGNOSIS — D649 Anemia, unspecified: Secondary | ICD-10-CM | POA: Diagnosis not present

## 2014-11-08 DIAGNOSIS — M5441 Lumbago with sciatica, right side: Secondary | ICD-10-CM | POA: Insufficient documentation

## 2014-11-08 DIAGNOSIS — E1142 Type 2 diabetes mellitus with diabetic polyneuropathy: Secondary | ICD-10-CM | POA: Insufficient documentation

## 2014-11-08 LAB — GLUCOSE, POCT (MANUAL RESULT ENTRY): POC GLUCOSE: 87 mg/dL (ref 70–99)

## 2014-11-08 MED ORDER — ATORVASTATIN CALCIUM 20 MG PO TABS: 20.0000 mg | ORAL_TABLET | Freq: Every day | ORAL | Status: DC

## 2014-11-08 MED ORDER — CYCLOBENZAPRINE HCL 10 MG PO TABS: 10.0000 mg | ORAL_TABLET | Freq: Three times a day (TID) | ORAL | Status: DC | PRN

## 2014-11-08 MED ORDER — HYDROCODONE-ACETAMINOPHEN 5-325 MG PO TABS: 1.0000 | ORAL_TABLET | Freq: Three times a day (TID) | ORAL | Status: DC | PRN

## 2014-11-08 MED ORDER — FLUOXETINE HCL 40 MG PO CAPS: 40.0000 mg | ORAL_CAPSULE | Freq: Every day | ORAL | Status: DC

## 2014-11-08 NOTE — Progress Notes (Signed)
Patient here for follow up  Complains of still having back pain Recently was seen in the ED and diagnosed with right sided Sciatica Patient also states she is still having bleeding from her vagina

## 2014-11-08 NOTE — Progress Notes (Signed)
Patient ID: [name], female   DOB: 12-Sep-1960, 54 y.o.   MRN: [id number]  CC: back pain  HPI: [name] is a 54 y.o. female here today for a follow up visit.  Patient has past medical history of diabetes mellitus, HTN, depression, and sleep apnea. Patient has been to the ER with lower back pain one month ago. Was told that she had right sided sciatica. She reports that the pain radiates to her lower right abdomen. More painful with prolonged sitting and twisting. She notes that her pain is severe most days and she has been taking 6 (500 mg) tylenol per day with little relief. Pain is sharp and shooting in her right lower back. Still having some vaginal discharge and heavy bleeding. She reports that she would like a second opinion by another GYN because she feels like something more is wrong with her. She is taking iron supplements twice per day due to her anemia and heavy bleeding.   Allergies  Allergen Reactions  . Metformin And Related Nausea Only    Hands and feet tingling  . Tramadol Nausea Only   Past Medical History  Diagnosis Date  . Abscess   . Sleep difficulties   . Fatigue   . Sinus problem   . Encounter for drainage of abscess     increased drainage from abscess on buttock  . Abscess of buttock   . Hypertension   . Anxiety   . Dysrhythmia     approx 3 years ago, increased heart rate,  . Depression     sees [name]  . Sleep apnea     2008- sleep study, neg. for sleep apnea   . Anal fistula   . Diabetes mellitus without complication    Current Outpatient Prescriptions on File Prior to Visit  Medication Sig Dispense Refill  . atorvastatin (LIPITOR) 20 MG tablet Take 20 mg by mouth at bedtime.    . diazepam (VALIUM) 2 MG tablet Take 1 tablet (2 mg total) by mouth every 12 (twelve) hours as needed for anxiety. 60 tablet 1  . ferrous sulfate 325 (65 FE) MG tablet Take 1 tablet (325 mg total) by mouth 2 (two) times daily with a meal. 60 tablet 2  .  FLUoxetine (PROZAC) 40 MG capsule Take 40 mg by mouth daily.    . furosemide (LASIX) 40 MG tablet Take 1 tablet (40 mg total) by mouth daily. For 3 days. Then take 1 tablet (40mg  total) by mouth 3 (three) times a week. (Patient taking differently: Take 40 mg by mouth 3 (three) times daily. MONWEDFRI) 90 tablet 1  . glipiZIDE (GLUCOTROL) 5 MG tablet Take 2 tablets (10 mg total) by mouth 2 (two) times daily before a meal. 120 tablet 1  . insulin detemir (LEVEMIR) 100 UNIT/ML injection Inject 0.45 mLs (45 Units total) into the skin at bedtime. 10 mL 4  . lisinopril (PRINIVIL,ZESTRIL) 10 MG tablet Take 1 tablet (10 mg total) by mouth daily. 30 tablet 6  . metFORMIN (GLUCOPHAGE) 500 MG tablet Take 1 tablet (500 mg total) by mouth 2 (two) times daily with a meal. 90 tablet 3  . metoprolol tartrate (LOPRESSOR) 25 MG tablet One po bid (Patient taking differently: Take 25 mg by mouth 2 (two) times daily. ) 60 tablet 3  . omeprazole (PRILOSEC) 20 MG capsule Take 1 capsule (20 mg total) by mouth daily. 30 capsule 3   No current facility-administered medications on file prior to visit.   Family History  Problem Relation Age of Onset  . Hypertension Mother   . Alzheimer's disease Mother   . Diabetes Father   . Anesthesia problems Neg Hx   . Hypotension Neg Hx   . Malignant hyperthermia Neg Hx   . Pseudochol deficiency Neg Hx    Social History   Social History  . Marital Status: Divorced    Spouse Name: N/A  . Number of Children: N/A  . Years of Education: N/A   Occupational History  . Not on file.   Social History Main Topics  . Smoking status: Never Smoker   . Smokeless tobacco: Never Used  . Alcohol Use: No  . Drug Use: No  . Sexual Activity: Yes   Other Topics Concern  . Not on file   Social History Narrative    Review of Systems  Gastrointestinal: Positive for abdominal pain. Negative for nausea and vomiting.  Genitourinary:       Vaginal bleeding  Musculoskeletal: Positive  for back pain. Negative for falls.  Neurological: Positive for weakness. Negative for dizziness.  All other systems reviewed and are negative.  Objective:   Filed Vitals:   11/08/14 1638  BP: 126/67  Pulse: 65  Temp: 98 F (36.7 C)  Resp: 16    Physical Exam  Constitutional: She is oriented to person, place, and time.  Cardiovascular: Normal rate, regular rhythm and normal heart sounds.   Pulmonary/Chest: Effort normal and breath sounds normal.  Abdominal: Soft. Bowel sounds are normal. She exhibits no distension and no mass. There is tenderness.  Neurological: She is alert and oriented to person, place, and time.  Psychiatric: She has a normal mood and affect.     Lab Results  Component Value Date   WBC 11.5* 07/02/2014   HGB 10.2* 07/02/2014   HCT 33.9* 07/02/2014   MCV 71.1* 07/02/2014   PLT 404* 07/02/2014   Lab Results  Component Value Date   CREATININE 1.28* 08/23/2014   BUN 28* 08/23/2014   NA 136 08/23/2014   K 3.9 08/23/2014   CL 105 08/23/2014   CO2 22 08/23/2014    Lab Results  Component Value Date   HGBA1C 6.50 08/23/2014   Lipid Panel  No results found for: CHOL, TRIG, HDL, CHOLHDL, VLDL, LDLCALC     Assessment and plan:   [name] was seen today for back pain.  Diagnoses and all orders for this visit:  Right-sided low back pain with right-sided sciatica -     AMB referral to orthopedics -     Refill HYDROcodone-acetaminophen (NORCO) 5-325 MG per tablet; Take 1 tablet by mouth every 8 (eight) hours as needed for moderate pain. -     Refill cyclobenzaprine (FLEXERIL) 10 MG tablet; Take 1 tablet (10 mg total) by mouth 3 (three) times daily as needed for muscle spasms. I have explained to patient that Norco is only for acute pain and it will not be used to manage chronic pain.   Anemia, unspecified anemia type -     CBC Patient will continue ferrous sulfate BID. She has been given resources to other GYN clinics for her convenience. Anemia is  from prolonged vaginal bleeding.   Type 2 diabetes mellitus with diabetic polyneuropathy -     Glucose (CBG) Stable, continue current regimen. Resume care with Endocrinology.   Return if symptoms worsen or fail to improve.    Lance Bosch, [facility] [phone] 11/08/2014, 5:19 PM

## 2014-11-09 LAB — CBC
HEMATOCRIT: 35.3 % — AB (ref 36.0–46.0)
HEMOGLOBIN: 11.4 g/dL — AB (ref 12.0–15.0)
MCH: 24.7 pg — ABNORMAL LOW (ref 26.0–34.0)
MCHC: 32.3 g/dL (ref 30.0–36.0)
MCV: 76.6 fL — ABNORMAL LOW (ref 78.0–100.0)
MPV: 11.3 fL (ref 8.6–12.4)
Platelets: 466 10*3/uL — ABNORMAL HIGH (ref 150–400)
RBC: 4.61 MIL/uL (ref 3.87–5.11)
RDW: 16.1 % — AB (ref 11.5–15.5)
WBC: 17.6 10*3/uL — AB (ref 4.0–10.5)

## 2014-11-17 ENCOUNTER — Telehealth: Payer: Self-pay | Admitting: *Deleted

## 2014-11-17 ENCOUNTER — Other Ambulatory Visit (HOSPITAL_COMMUNITY): Payer: Self-pay | Admitting: Orthopedic Surgery

## 2014-11-17 DIAGNOSIS — M545 Low back pain: Secondary | ICD-10-CM

## 2014-11-17 NOTE — Telephone Encounter (Signed)
Patient having question/concerns about feet swelling since she has been off the HCTZ since August appt with Dr. Loletha Grayer.  She states her feet have some mild to moderate swelling. She takes Lasix 40 mg, three times per week (MWF). Please advise. No other complaints or notable sx at this time. Routed to Dr. Loletha Grayer and to Minette Headland, RN.

## 2014-11-17 NOTE — Telephone Encounter (Signed)
Will open a new telephone call to address patient question, as this note is regarding a different subject matter from August 9th. Closing this encounter at this time.

## 2014-11-17 NOTE — Telephone Encounter (Signed)
Pt called in stating that Dr.C changed her BP pill when she in for an office visit on 8/3. Since then she has had some problems with her feet swelling since she is no longer on Lasix. She would like to be advised on what to do. Please call  Thanks

## 2014-11-17 NOTE — Telephone Encounter (Signed)
Routing to [facility].

## 2014-11-18 NOTE — Telephone Encounter (Signed)
Patient having question/concerns about feet swelling since she has been off the HCTZ since August appt with Dr. Loletha Grayer. She states her feet have some mild to moderate swelling. She takes Lasix 40 mg, three times per week (MWF). Please advise. No other complaints or notable sx at this time. Routed to Dr. Loletha Grayer and to Minette Headland, RN.

## 2014-11-19 ENCOUNTER — Telehealth: Payer: Self-pay

## 2014-11-19 DIAGNOSIS — E119 Type 2 diabetes mellitus without complications: Secondary | ICD-10-CM

## 2014-11-19 MED ORDER — FUROSEMIDE 40 MG PO TABS: 40.0000 mg | ORAL_TABLET | Freq: Every day | ORAL | Status: DC

## 2014-11-19 NOTE — Telephone Encounter (Signed)
Patient called me today to find out if her question from [facility] Triage was addressed about her HCTZ  In July she was started on Lasix, MWF.  In August HCTZ was removed because her blood pressure was too low  She finds she has swelling in feet and ankles on the days she does not take the lasix  What can she do about the swelling on the days she doesn't take the Lasix?  When asked she says an indent stays a while after swelling is pushed in with her finger

## 2014-11-19 NOTE — Telephone Encounter (Signed)
Please see my recommendations from yesterday. Increase furosemide to 5 days a week please. Check BMET in 2 weeks

## 2014-11-19 NOTE — Telephone Encounter (Signed)
Called patient to increase Lasix (furosemide) dose to 5 days a week and to have lab work drawn in 2 weeks, September 26th  Left message of the above on identified VM and to CB if she has any questions  BMET ordered on or about September 26th

## 2014-11-24 ENCOUNTER — Other Ambulatory Visit: Payer: Self-pay | Admitting: Internal Medicine

## 2014-11-25 ENCOUNTER — Telehealth: Payer: Self-pay

## 2014-11-25 ENCOUNTER — Ambulatory Visit (HOSPITAL_COMMUNITY)
Admission: RE | Admit: 2014-11-25 | Discharge: 2014-11-25 | Disposition: A | Discharge status: Home / Self Care | Payer: BC Managed Care – PPO | Source: Ambulatory Visit | Attending: Orthopedic Surgery | Admitting: Orthopedic Surgery

## 2014-11-25 DIAGNOSIS — M545 Low back pain: Secondary | ICD-10-CM

## 2014-11-25 NOTE — Telephone Encounter (Signed)
-----   Message from Lance Bosch, NP sent at 11/24/2014 10:06 PM EDT ----- Patient's WBC is elevated, has she been back to GYN. Does she still have drainage from anal fissure? If she has been to GYN did they treat with antibiotics? I will likely have her to repeat CBC with Diff this time--let me know response first and I will give further instructions

## 2014-11-25 NOTE — Telephone Encounter (Signed)
Patient not available Left message on voice mail to return our call 

## 2014-12-06 ENCOUNTER — Ambulatory Visit (HOSPITAL_COMMUNITY)
Admission: RE | Admit: 2014-12-06 | Discharge: 2014-12-06 | Disposition: A | Discharge status: Home / Self Care | Payer: BC Managed Care – PPO | Source: Ambulatory Visit | Attending: Orthopedic Surgery | Admitting: Orthopedic Surgery

## 2014-12-06 ENCOUNTER — Ambulatory Visit (HOSPITAL_COMMUNITY): Payer: BC Managed Care – PPO

## 2014-12-06 ENCOUNTER — Ambulatory Visit (HOSPITAL_COMMUNITY): Admission: RE | Admit: 2014-12-06 | Payer: BC Managed Care – PPO | Source: Ambulatory Visit

## 2014-12-06 ENCOUNTER — Other Ambulatory Visit (HOSPITAL_COMMUNITY): Payer: Self-pay | Admitting: Orthopedic Surgery

## 2014-12-06 DIAGNOSIS — M79605 Pain in left leg: Secondary | ICD-10-CM | POA: Insufficient documentation

## 2014-12-06 DIAGNOSIS — M47896 Other spondylosis, lumbar region: Secondary | ICD-10-CM | POA: Insufficient documentation

## 2014-12-06 DIAGNOSIS — M545 Low back pain: Secondary | ICD-10-CM | POA: Diagnosis present

## 2014-12-06 DIAGNOSIS — M5441 Lumbago with sciatica, right side: Secondary | ICD-10-CM

## 2014-12-06 DIAGNOSIS — M79604 Pain in right leg: Secondary | ICD-10-CM | POA: Diagnosis not present

## 2014-12-07 ENCOUNTER — Encounter: Payer: Self-pay | Admitting: Internal Medicine

## 2014-12-07 ENCOUNTER — Ambulatory Visit (INDEPENDENT_AMBULATORY_CARE_PROVIDER_SITE_OTHER): Payer: BC Managed Care – PPO | Admitting: Internal Medicine

## 2014-12-07 VITALS — BP 114/62 | HR 71 | Temp 98.2°F | Resp 12 | Wt 266.0 lb

## 2014-12-07 DIAGNOSIS — E1142 Type 2 diabetes mellitus with diabetic polyneuropathy: Secondary | ICD-10-CM

## 2014-12-07 DIAGNOSIS — R7989 Other specified abnormal findings of blood chemistry: Secondary | ICD-10-CM

## 2014-12-07 DIAGNOSIS — R946 Abnormal results of thyroid function studies: Secondary | ICD-10-CM

## 2014-12-07 DIAGNOSIS — E042 Nontoxic multinodular goiter: Secondary | ICD-10-CM

## 2014-12-07 LAB — LIPID PANEL
CHOL/HDL RATIO: 6
Cholesterol: 175 mg/dL (ref 0–200)
HDL: 30.1 mg/dL — ABNORMAL LOW (ref >39.00)
NonHDL: 145.01
Triglycerides: 220 mg/dL — ABNORMAL HIGH (ref 0.0–149.0)
VLDL: 44 mg/dL — AB (ref 0.0–40.0)

## 2014-12-07 LAB — BASIC METABOLIC PANEL
BUN: 28 mg/dL — ABNORMAL HIGH (ref 6–23)
CHLORIDE: 105 meq/L (ref 96–112)
CO2: 27 mEq/L (ref 19–32)
CREATININE: 1.41 mg/dL — AB (ref 0.40–1.20)
Calcium: 9.2 mg/dL (ref 8.4–10.5)
GFR: 41.3 mL/min — ABNORMAL LOW (ref >60.00)
Glucose, Bld: 113 mg/dL — ABNORMAL HIGH (ref 70–99)
POTASSIUM: 3.9 meq/L (ref 3.5–5.1)
Sodium: 138 mEq/L (ref 135–145)

## 2014-12-07 LAB — T4, FREE: FREE T4: 0.75 ng/dL (ref 0.60–1.60)

## 2014-12-07 LAB — HEMOGLOBIN A1C: HEMOGLOBIN A1C: 7.5 % — AB (ref 4.6–6.5)

## 2014-12-07 LAB — T3, FREE: T3 FREE: 2.8 pg/mL (ref 2.3–4.2)

## 2014-12-07 LAB — TSH: TSH: 0.28 u[IU]/mL — AB (ref 0.35–4.50)

## 2014-12-07 NOTE — Progress Notes (Addendum)
Patient ID: [name], female   DOB: 04-21-1960, 54 y.o.   MRN: [id number]   HPI  [name] is a 54 y.o.-year-old female, returning for f/u for 2 R thyroid nodule. She would also want me to address her DM2, dx 2011, insulin-dependent since 6295, without complications.  Thyroid nodules: Pt had a recent PET scan (09/07/2014) (to investigate an ovarian mass) that showed 2 hyperactive thyroid nodules in the R thyroid lobe:  No hypermetabolic lymph nodes in the neck.  2.5 cm hypermetabolic right thyroid nodule, max SUV 6.3.  1.5 cm midline inferior right thyroid nodule, max SUV 4.8. Pt was sent to me to further investigate these nodules.  At last visit, we ordered an Uptake and scan >> not done yet. She missed the appointment that she was sick.  I reviewed pt's thyroid tests: Lab Results  Component Value Date   TSH 0.57 09/17/2014   TSH 0.416 08/23/2014   TSH 0.287* 05/20/2014   FREET4 0.82 09/17/2014   FREET4 0.76* 08/23/2014    Pt denies feeling nodules in neck, hoarseness, dysphagia/odynophagia, SOB with lying down. + occasional choking when drinks with a straw.   Pt mentions: - no heat intolerance/cold intolerance - no tremors - no palpitations - no hyperdefecation/constipation - no weight changes - no dry skin - no hair falling - no problems with concentration - no fatigue  DM2: Last hemoglobin A1c was: Lab Results  Component Value Date   HGBA1C 6.50 08/23/2014   HGBA1C 7.90 05/20/2014   HGBA1C * 04/15/2009    10.2 (NOTE) The ADA recommends the following therapeutic goal for glycemic control related to Hgb A1c measurement: Goal of therapy: <6.5 Hgb A1c  Reference: American Diabetes Association: Clinical Practice Recommendations 2010, Diabetes Care, 2010, 33: (Suppl  1).   Pt is on a regimen of: - Metformin 500 mg 2x a day  - started 09/2014. - Glipizide 10 mg 2x a day She stopped Levemir 45 units at bedtime She was on Januvia in 03/2014 >> N/V >>  stopped.  Pt checks her sugars 1x a day and they are: - am: 80-100, 180-220 in last week >> 78-110 - 2h after b'fast: n/c - before lunch: n/c - 2h after lunch: n/c - before dinner: n/c - 2h after dinner: n/c >> 140-180 - bedtime: n/c >> 120-130 - nighttime: n/c No lows. Lowest sugar was 78; she has hypoglycemia awareness at 70.  Highest sugar was 180.  Glucometer: ReliOn  Pt's meals are: - Breakfast: skips - Lunch: sandwich, carrots + dip or crackers - Dinner: chicken (baked) + veggies  - Snacks: 2 small cookies before bedtime No sodas.   - + CKD stage 2, last BUN/creatinine:  Lab Results  Component Value Date   BUN 28* 08/23/2014   CREATININE 1.28* 08/23/2014  On Lisinopril. - last set of lipids: No results found for: CHOL, HDL, LDLCALC, LDLDIRECT, TRIG, CHOLHDL  On Lipitor 20 mg daily. - last eye exam was in 2015. No DR.  - no numbness and tingling in her feet.  ROS: Constitutional: see history of present illness Eyes: no blurry vision, no xerophthalmia ENT: no sore throat, no nodules palpated in throat, no dysphagia/odynophagia, no hoarseness Cardiovascular: no CP/SOB/palpitations/leg swelling Respiratory: no cough/SOB Gastrointestinal: no N/V/D/C Musculoskeletal: no muscle/joint aches Skin: no rashes Neurological: no tremors/numbness/tingling/dizziness  I reviewed pt's medications, allergies, PMH, social hx, family hx, and changes were documented in the history of present illness. Otherwise, unchanged from my initial visit note. Past Medical History  Diagnosis  Date  . Abscess   . Sleep difficulties   . Fatigue   . Sinus problem   . Encounter for drainage of abscess     increased drainage from abscess on buttock  . Abscess of buttock   . Hypertension   . Anxiety   . Dysrhythmia     approx 3 years ago, increased heart rate,  . Depression     sees [name]  . Sleep apnea     2008- sleep study, neg. for sleep apnea   . Anal fistula   . Diabetes  mellitus without complication    Past Surgical History  Procedure Laterality Date  . Knee arthroscopy      left  . Shoulder surgery  04/14/09    right  . Incise and drain abcess      abscess on right thigh and buttock  . Anal examination under anesthesia  02/21/11    anal fistula  . Breast surgery  patient does not remember date of procedure    pull fluid off lft br   History   Social History  . Marital Status: Divorced    Spouse Name: N/A   Social History Main Topics  . Smoking status: Never Smoker   . Smokeless tobacco: Never Used  . Alcohol Use: No  . Drug Use: No   Current Outpatient Prescriptions on File Prior to Visit  Medication Sig Dispense Refill  . atorvastatin (LIPITOR) 20 MG tablet Take 1 tablet (20 mg total) by mouth at bedtime. 30 tablet 5  . cyclobenzaprine (FLEXERIL) 10 MG tablet Take 1 tablet (10 mg total) by mouth 3 (three) times daily as needed for muscle spasms. 60 tablet 1  . diazepam (VALIUM) 2 MG tablet Take 1 tablet (2 mg total) by mouth every 12 (twelve) hours as needed for anxiety. 60 tablet 1  . ferrous sulfate 325 (65 FE) MG tablet Take 1 tablet (325 mg total) by mouth 2 (two) times daily with a meal. 60 tablet 2  . FLUoxetine (PROZAC) 40 MG capsule Take 1 capsule (40 mg total) by mouth daily. 30 capsule 4  . furosemide (LASIX) 40 MG tablet Take 1 tablet (40 mg total) by mouth daily. Take lasix 5 days a week 90 tablet 1  . glipiZIDE (GLUCOTROL) 5 MG tablet TAKE TWO TABLETS BY MOUTH TWICE DAILY BEFORE MEAL(S) 120 tablet 2  . HYDROcodone-acetaminophen (NORCO) 5-325 MG per tablet Take 1 tablet by mouth every 8 (eight) hours as needed for moderate pain. 60 tablet 0  . insulin detemir (LEVEMIR) 100 UNIT/ML injection Inject 0.45 mLs (45 Units total) into the skin at bedtime. 10 mL 4  . lisinopril (PRINIVIL,ZESTRIL) 10 MG tablet Take 1 tablet (10 mg total) by mouth daily. 30 tablet 6  . metFORMIN (GLUCOPHAGE) 500 MG tablet Take 1 tablet (500 mg total) by  mouth 2 (two) times daily with a meal. 90 tablet 3  . metoprolol tartrate (LOPRESSOR) 25 MG tablet One po bid (Patient taking differently: Take 25 mg by mouth 2 (two) times daily. ) 60 tablet 3  . omeprazole (PRILOSEC) 20 MG capsule Take 1 capsule (20 mg total) by mouth daily. 30 capsule 3   No current facility-administered medications on file prior to visit.   Allergies  Allergen Reactions  . Metformin And Related Nausea Only    Hands and feet tingling  . Tramadol Nausea Only   Family History  Problem Relation Age of Onset  . Hypertension Mother   . Alzheimer's disease  Mother   . Diabetes Father   . Anesthesia problems Neg Hx   . Hypotension Neg Hx   . Malignant hyperthermia Neg Hx   . Pseudochol deficiency Neg Hx    PE: BP 114/62 mmHg  Pulse 71  Temp(Src) 98.2 F (36.8 C) (Oral)  Resp 12  Wt 266 lb (120.657 kg)  SpO2 98% Body mass index is 47.13 kg/(m^2).  Wt Readings from Last 3 Encounters:  12/07/14 266 lb (120.657 kg)  11/25/14 259 lb (117.482 kg)  11/08/14 263 lb (119.296 kg)   Constitutional: overweight, in NAD Eyes: PERRLA, EOMI, no exophthalmos ENT: moist mucous membranes, + thyromegaly, no cervical lymphadenopathy Cardiovascular: RRR, No MRG Respiratory: CTA B Gastrointestinal: abdomen soft, NT, ND, BS+ Musculoskeletal: no deformities, strength intact in all 4;  Skin: moist, warm, no rashes Neurological: no tremor with outstretched hands, DTR normal in all 4  ASSESSMENT: 1. 2 thyroid nodules, R thyroid lobe - hyperactive on PET scan  2. DM2, insulin-dependent, uncontrolled, without complications  PLAN: 1. Thyroid nodules - I again reviewed the report of her PET scan along with the patient. I explained that PET hypermetabolism in the thyroid nodules usually means overactive nodules or cancerous nodules. Hypermetabolism scattered throughout the gland usually means increased inflammation as in Graves' disease. Patient did have a low TSH in 05/2014, which  has normalized in 08/2014. However, her TSH was still on the low side at last check, and I would like to obtain a thyroid uptake and scan to see if her nodules are overactive. This would be very reassuring, essentially eliminating the risk for cancer. I believe that she would also would benefit from getting a thyroid ultrasound to better characterize the nodule structurally, But for now, I would like to proceed only with a thyroid uptake and scan. She agrees with the plan. She tells me that she missed the previous thyroid uptake and scan as she was sick. - For now, I will also like to repeat her thyroid tests, TSH, free T4, free T3  2. DM2, insulin-dependent, uncontrolled, without complications - we reviewed together the last hemoglobin A1c, which was at target, at 6.5%. - we'll repeat the hemoglobin A1c today - at last visit, we restarted metformin and increase glipizide. She restarted metformin, she stopped Levemir (!), and her sugars remained at goal. This is excellent, and we'll continue without Levemir for now. I advised her to pay attention to the sugars in the future to see if we can back off glipizide, also.  Patient Instructions  Please stop at the lab..  We will call you with the Uptake and scan schedule.  Continue: - Metformin 500 mg 2x a day - Glipizide 10 mg 2x a day.  Please let me know if the sugars are consistently <80 or >200.  Please come back for a follow-up appointment in 3 months.  - continue to check sugars at different times of the day - check 2 times a day, rotating checks - advised for yearly eye exams. She is up-to-date, but needs a new eye exam soon. - Refuses flu vaccine today, she will get this through work - Today we will check her cholesterol level and BMP per her request - Return to clinic in 3 mo with sugar log   Office Visit on 12/07/2014  Component Date Value Ref Range Status  . Cholesterol 12/07/2014 175  0 - 200 mg/dL Final   ATP III Classification        Desirable:  < 200 mg/dL  Borderline High:  200 - 239 mg/dL          High:  > = 240 mg/dL  . Triglycerides 12/07/2014 220.0* 0.0 - 149.0 mg/dL Final   Normal:  <150 mg/dLBorderline High:  150 - 199 mg/dL  . HDL 12/07/2014 30.10* >39.00 mg/dL Final  . VLDL 12/07/2014 44.0* 0.0 - 40.0 mg/dL Final  . Total CHOL/HDL Ratio 12/07/2014 6   Final                  Men          Women1/2 Average Risk     3.4          3.3Average Risk          5.0          4.42X Average Risk          9.6          7.13X Average Risk          15.0          11.0                      . NonHDL 12/07/2014 145.01   Final   NOTE:  Non-HDL goal should be 30 mg/dL higher than patient's LDL goal (i.e. LDL goal of < 70 mg/dL, would have non-HDL goal of < 100 mg/dL)  . TSH 12/07/2014 0.28* 0.35 - 4.50 uIU/mL Final  . Free T4 12/07/2014 0.75  0.60 - 1.60 ng/dL Final  . T3, Free 12/07/2014 2.8  2.3 - 4.2 pg/mL Final  . Sodium 12/07/2014 138  135 - 145 mEq/L Final  . Potassium 12/07/2014 3.9  3.5 - 5.1 mEq/L Final  . Chloride 12/07/2014 105  96 - 112 mEq/L Final  . CO2 12/07/2014 27  19 - 32 mEq/L Final  . Glucose, Bld 12/07/2014 113* 70 - 99 mg/dL Final  . BUN 12/07/2014 28* 6 - 23 mg/dL Final  . Creatinine, Ser 12/07/2014 1.41* 0.40 - 1.20 mg/dL Final  . Calcium 12/07/2014 9.2  8.4 - 10.5 mg/dL Final  . GFR 12/07/2014 41.30* >60.00 mL/min Final  . Hgb A1c MFr Bld 12/07/2014 7.5* 4.6 - 6.5 % Final   Glycemic Control Guidelines for People with Diabetes:Non Diabetic:  <6%Goal of Therapy: <7%Additional Action Suggested:  >8%   . Direct LDL 12/07/2014 127.0   Final   Optimal:  <100 mg/dLNear or Above Optimal:  100-129 mg/dLBorderline High:  130-159 mg/dLHigh:  160-189 mg/dLVery High:  >190 mg/dL    Lipid panel abnormal, with higher triglycerides and LDL, and low HDL. I will forward this to PCP.  Hemoglobin A1c is higher. If the trend is maintained, at next visit, we'll need to intensify her diabetes regimen.  Her  creatinine level is higher. We'll need to keep an eye on her kidney function. For now, continue metformin at the current dose, which is half of the maximal metformin dose. Will advise her to stay well-hydrated.  TSH is suppressed, we'll await the results of the uptake and scan to obtain a diagnosis. No intervention needed for now, since she only has subclinical hyperthyroidism, and this is mild.   Thyroid uptake and scan (03/09/2015):  The 24 hour radioactive iodine uptake is equal to 13.9% (10% to 30%).  There is heterogeneous scratch set there is overall decreased uptake within both lobes. Several medium size foci of mild uptake are noted within the upper poles of both lobes as well as the  inferior pole the right lobe.  IMPRESSION: 1. Normal 24 hour radioactive iodine uptake. 2. Multi nodular thyroid gland. 3. Regarding the hypermetabolic nodule on PET-CT from 09/07/2014. Hypermetabolic thyroid nodules on PET have up to 40-50% incidence of malignancy; recommend further evaluation with thyroid ultrasound and possible US-guided fine needle aspiration.  Thyroid U/S (03/18/2015):  Right thyroid lobe: 6.4 x 3.2 x 3.8 cm. Heterogeneous tissue. Large lower pole mass measures 4.8 x 2.7 x 3.0 cm.   Left thyroid lobe: 6.3 x 2.7 x 2.8 cm. Heterogeneous tissue. Large mass in the lower pole measures 4.8 x 3.1 x 5.1 cm. Sub cm mid and upper left lobe nodules are noted.    Isthmus thickness: 2.5 cm. Right isthmic solid nodule measures 1.8 x 1.3 x 1.6 cm. It is heterogeneous with some internal vascularity.   Lymphadenopathy: None visualized.  IMPRESSION: Multiple nodules as described. Bilateral lower pole dominant nodules meet criteria for biopsy.   Will suggest biopsy of all 3 thyroid nodules.  Addendum (04/01/2015):  - 1 biopsy was not possible  Adequacy Reason Satisfactory For Evaluation. Diagnosis THYROID, FINE NEEDLE ASPIRATION ISTHMUS, SPECIMEN 1 OF 2, COLLECTED ON 1/88/67): SCANT  FOLLICULAR EPITHELIUM PRESENT (BETHESDA CATEGORY I). Casimer Lanius MD Pathologist, Electronic Signature (Case signed 04/01/2015) Specimen Clinical Information Right isthmic solid nodule measures 1.8 x 1.3 x 1.6 cm, It is heterogeneous with some internal vascularity Source Thyroid, Fine Needle Aspiration, Isthmus, (Specimen 1 of 2, collected on 03/31/2015)  Adequacy Reason Satisfactory For Evaluation. Diagnosis THYROID, FINE NEEDLE ASPIRATION RLP, (SPECIMEN 2 OF 2, COLLECTED ON 7/37/36): SCANT FOLLICULAR EPITHELIUM PRESENT (BETHESDA CATEGORY I). Casimer Lanius MD Pathologist, Electronic Signature (Case signed 04/01/2015) Specimen Clinical Information Large right lower pole mass measures 4.8 x 2.7 x 3.0 cm Source Thyroid, Fine Needle Aspiration, RLP, (Specimen 2 of 2, collected on 03/31/2015)  Benign biopsies. We may need to repeat them though as they showed scan epithelium cells.  Uptake and scan pending.

## 2014-12-07 NOTE — Patient Instructions (Signed)
Please stop at the lab..  We will call you with the Uptake and scan schedule.  Continue: - Metformin 500 mg 2x a day - Glipizide 10 mg 2x a day.  Please let me know if the sugars are consistently <80 or >200.  Please come back for a follow-up appointment in 3 months.

## 2014-12-08 LAB — LDL CHOLESTEROL, DIRECT: LDL DIRECT: 127 mg/dL

## 2015-02-25 ENCOUNTER — Other Ambulatory Visit: Payer: Self-pay | Admitting: Internal Medicine

## 2015-03-02 ENCOUNTER — Telehealth: Payer: Self-pay | Admitting: Internal Medicine

## 2015-03-02 MED ORDER — METOPROLOL TARTRATE 25 MG PO TABS: 25.0000 mg | ORAL_TABLET | Freq: Two times a day (BID) | ORAL | Status: DC

## 2015-03-02 NOTE — Telephone Encounter (Signed)
metoprolol tartrate (LOPRESSOR) 25 MG tablet Pt. Needs refill

## 2015-03-08 ENCOUNTER — Encounter (HOSPITAL_COMMUNITY)
Admission: RE | Admit: 2015-03-08 | Discharge: 2015-03-08 | Disposition: A | Discharge status: Home / Self Care | Payer: BC Managed Care – PPO | Source: Ambulatory Visit | Attending: Internal Medicine | Admitting: Internal Medicine

## 2015-03-08 ENCOUNTER — Ambulatory Visit: Payer: BC Managed Care – PPO | Admitting: Internal Medicine

## 2015-03-08 DIAGNOSIS — E041 Nontoxic single thyroid nodule: Secondary | ICD-10-CM | POA: Insufficient documentation

## 2015-03-08 LAB — HM DIABETES EYE EXAM

## 2015-03-08 MED ORDER — SODIUM IODIDE I 131 CAPSULE
8.2000 | Freq: Once | INTRAVENOUS | Status: AC | PRN
  Administered 2015-03-08: 8.2 via ORAL

## 2015-03-09 ENCOUNTER — Encounter (HOSPITAL_COMMUNITY)
Admission: RE | Admit: 2015-03-09 | Discharge: 2015-03-09 | Disposition: A | Discharge status: Home / Self Care | Payer: BC Managed Care – PPO | Source: Ambulatory Visit | Attending: Internal Medicine | Admitting: Internal Medicine

## 2015-03-09 DIAGNOSIS — E041 Nontoxic single thyroid nodule: Secondary | ICD-10-CM | POA: Diagnosis present

## 2015-03-09 MED ORDER — SODIUM PERTECHNETATE TC 99M INJECTION
11.0600 | Freq: Once | INTRAVENOUS | Status: AC | PRN
  Administered 2015-03-09: 11.06 via INTRAVENOUS

## 2015-03-13 ENCOUNTER — Other Ambulatory Visit: Payer: Self-pay | Admitting: Internal Medicine

## 2015-03-15 ENCOUNTER — Encounter: Payer: Self-pay | Admitting: Internal Medicine

## 2015-03-15 ENCOUNTER — Telehealth: Payer: Self-pay | Admitting: Internal Medicine

## 2015-03-15 ENCOUNTER — Other Ambulatory Visit: Payer: Self-pay | Admitting: Internal Medicine

## 2015-03-15 NOTE — Telephone Encounter (Signed)
Please advise of pt's results. Thank you.

## 2015-03-15 NOTE — Telephone Encounter (Signed)
Patient calling for the results of her labs °

## 2015-03-16 ENCOUNTER — Encounter: Payer: Self-pay | Admitting: Internal Medicine

## 2015-03-16 ENCOUNTER — Other Ambulatory Visit: Payer: Self-pay | Admitting: Internal Medicine

## 2015-03-16 ENCOUNTER — Telehealth: Payer: Self-pay | Admitting: Internal Medicine

## 2015-03-16 DIAGNOSIS — E041 Nontoxic single thyroid nodule: Secondary | ICD-10-CM

## 2015-03-16 NOTE — Telephone Encounter (Signed)
Please call with results

## 2015-03-16 NOTE — Telephone Encounter (Signed)
Dr Cruzita Lederer is reviewing the results and will advise.

## 2015-03-16 NOTE — Telephone Encounter (Signed)
I will let her know as soon as I get to reviewing the results.

## 2015-03-18 ENCOUNTER — Ambulatory Visit
Admission: RE | Admit: 2015-03-18 | Discharge: 2015-03-18 | Disposition: A | Discharge status: Home / Self Care | Payer: BC Managed Care – PPO | Source: Ambulatory Visit | Attending: Internal Medicine | Admitting: Internal Medicine

## 2015-03-21 ENCOUNTER — Encounter: Payer: Self-pay | Admitting: Internal Medicine

## 2015-03-21 NOTE — Addendum Note (Signed)
Addended by: Philemon Kingdom on: 03/21/2015 03:21 PM   Modules accepted: Orders

## 2015-03-22 ENCOUNTER — Encounter: Payer: Self-pay | Admitting: Internal Medicine

## 2015-03-25 ENCOUNTER — Telehealth: Payer: Self-pay | Admitting: Internal Medicine

## 2015-03-25 NOTE — Telephone Encounter (Signed)
Patient called requesting a medication refill for Diazepam Please follow up with patient.

## 2015-03-28 ENCOUNTER — Telehealth: Payer: Self-pay

## 2015-03-28 NOTE — Telephone Encounter (Signed)
Since I have weaned her off, I do not feel comfortable placing her back on such a high risk medication. She may need to have a psychiatry referral

## 2015-03-28 NOTE — Telephone Encounter (Signed)
Patient returned phone call Patient states her symptoms have  Returned Having trouble sleeping and when she does fall asleep  She has been having night mares,tosses and turns all night Would like to go back on the diazepam

## 2015-03-28 NOTE — Telephone Encounter (Signed)
Returned phone call to patient  Patient  Not available Message left on voice mail to return our call

## 2015-03-29 ENCOUNTER — Telehealth: Payer: Self-pay

## 2015-03-29 DIAGNOSIS — F419 Anxiety disorder, unspecified: Secondary | ICD-10-CM

## 2015-03-29 NOTE — Telephone Encounter (Signed)
Spoke with patient this am  Patient is aware her provider is not comfortable putting her back  On diazepam Referral placed in epic for psychiatry and patient is aware

## 2015-03-31 ENCOUNTER — Ambulatory Visit
Admission: RE | Admit: 2015-03-31 | Discharge: 2015-03-31 | Disposition: A | Discharge status: Home / Self Care | Payer: BC Managed Care – PPO | Source: Ambulatory Visit | Attending: Internal Medicine | Admitting: Internal Medicine

## 2015-03-31 ENCOUNTER — Encounter: Payer: Self-pay | Admitting: Internal Medicine

## 2015-03-31 ENCOUNTER — Other Ambulatory Visit: Payer: BC Managed Care – PPO

## 2015-03-31 ENCOUNTER — Other Ambulatory Visit (HOSPITAL_COMMUNITY)
Admission: RE | Admit: 2015-03-31 | Discharge: 2015-03-31 | Disposition: A | Discharge status: Home / Self Care | Payer: BC Managed Care – PPO | Source: Ambulatory Visit | Attending: Radiology | Admitting: Radiology

## 2015-03-31 ENCOUNTER — Inpatient Hospital Stay: Admission: RE | Admit: 2015-03-31 | Payer: BC Managed Care – PPO | Source: Ambulatory Visit

## 2015-03-31 DIAGNOSIS — E041 Nontoxic single thyroid nodule: Secondary | ICD-10-CM | POA: Diagnosis present

## 2015-03-31 NOTE — Procedures (Deleted)
Successful US guided FNA of dominant (R)thyroid nodule and isthmus nodule.  US imaging demonstrates diffuse heterogeneous left thyromegaly without discrete nodule. No complications.  Ascencion Dike PA-C Interventional Radiology 03/31/2015 8:46 AM

## 2015-04-01 ENCOUNTER — Encounter: Payer: Self-pay | Admitting: Internal Medicine

## 2015-04-06 ENCOUNTER — Encounter: Payer: Self-pay | Admitting: Internal Medicine

## 2015-04-06 ENCOUNTER — Other Ambulatory Visit: Payer: Self-pay | Admitting: *Deleted

## 2015-04-06 MED ORDER — INSULIN GLARGINE 100 UNIT/ML SOLOSTAR PEN: PEN_INJECTOR | SUBCUTANEOUS | Status: DC

## 2015-04-11 ENCOUNTER — Encounter: Payer: Self-pay | Admitting: Cardiovascular Disease

## 2015-04-11 ENCOUNTER — Ambulatory Visit (INDEPENDENT_AMBULATORY_CARE_PROVIDER_SITE_OTHER): Payer: BC Managed Care – PPO | Admitting: Cardiovascular Disease

## 2015-04-11 VITALS — BP 102/60 | HR 73 | Ht 63.0 in | Wt 264.1 lb

## 2015-04-11 DIAGNOSIS — E1121 Type 2 diabetes mellitus with diabetic nephropathy: Secondary | ICD-10-CM

## 2015-04-11 DIAGNOSIS — I471 Supraventricular tachycardia: Secondary | ICD-10-CM | POA: Diagnosis not present

## 2015-04-11 NOTE — Progress Notes (Signed)
Patient ID: [name], female   DOB: [birth year], 55 y.o.   MRN: [id number] .    Cardiology Office Note    Date:  04/11/2015   ID:  [name], DOB 1960-12-01, MRN [id number]  PCP:  Lance Bosch, NP  Cardiologist:   Sanda Klein, MD   Chief Complaint  Patient presents with  . 6 month visit    pt c/o SOB when she lays down, dizziness when changing positions, no other Sx.    History of Present Illness:  [name] is a 55 y.o. female with recurrent episodes of abrupt onset superventricular tachycardia that terminates with the Valsalva maneuver. Since her last appointment about 6 months ago she has had 2 episodes of sustained palpitations. On each occasion, she was able to terminate the arrhythmia with 3 attempts at the Valsalva maneuver. She is taking beta blocker in a dose that is limited by relatively low blood pressure and previous problems with bradycardia. When the palpitations she denies dizziness, dyspnea or angina.  Significant comorbid conditions include treated hyperlipidemia and type 2 diabetes mellitus. She does not have known structural heart disease. Her previous open wound has healed completely. Her edema is well controlled on the current dose of furosemide on the which she takes only 5 days a week    Past Medical History  Diagnosis Date  . Abscess   . Sleep difficulties   . Fatigue   . Sinus problem   . Encounter for drainage of abscess     increased drainage from abscess on buttock  . Abscess of buttock   . Hypertension   . Anxiety   . Dysrhythmia     approx 3 years ago, increased heart rate,  . Depression     sees [name]  . Sleep apnea     2008- sleep study, neg. for sleep apnea   . Anal fistula   . Diabetes mellitus without complication [facility])     Past Surgical History  Procedure Laterality Date  . Knee arthroscopy      left  . Shoulder surgery  04/14/09    right  . Incise and drain abcess      abscess on right thigh and  buttock  . Anal examination under anesthesia  02/21/11    anal fistula  . Breast surgery  patient does not remember date of procedure    pull fluid off lft br    Outpatient Prescriptions Prior to Visit  Medication Sig Dispense Refill  . atorvastatin (LIPITOR) 20 MG tablet Take 1 tablet (20 mg total) by mouth at bedtime. 30 tablet 5  . ferrous sulfate 325 (65 FE) MG tablet Take 1 tablet (325 mg total) by mouth 2 (two) times daily with a meal. 60 tablet 2  . FLUoxetine (PROZAC) 40 MG capsule Take 1 capsule (40 mg total) by mouth daily. 30 capsule 4  . furosemide (LASIX) 40 MG tablet Take 1 tablet (40 mg total) by mouth daily. Take lasix 5 days a week 90 tablet 1  . glipiZIDE (GLUCOTROL) 5 MG tablet TAKE TWO TABLETS BY MOUTH TWICE DAILY BEFORE MEAL(S) 120 tablet 2  . lisinopril (PRINIVIL,ZESTRIL) 10 MG tablet Take 1 tablet (10 mg total) by mouth daily. 30 tablet 6  . metFORMIN (GLUCOPHAGE) 500 MG tablet Take 1 tablet (500 mg total) by mouth 2 (two) times daily with a meal. 90 tablet 3  . metoprolol tartrate (LOPRESSOR) 25 MG tablet Take 1 tablet (25 mg total) by mouth 2 (two) times daily.  60 tablet 3  . omeprazole (PRILOSEC) 20 MG capsule Take 1 capsule (20 mg total) by mouth daily. 30 capsule 3  . cyclobenzaprine (FLEXERIL) 10 MG tablet Take 1 tablet (10 mg total) by mouth 3 (three) times daily as needed for muscle spasms. 60 tablet 1  . diazepam (VALIUM) 2 MG tablet Take 1 tablet (2 mg total) by mouth every 12 (twelve) hours as needed for anxiety. 60 tablet 1  . glipiZIDE (GLUCOTROL) 5 MG tablet TAKE TWO TABLETS BY MOUTH TWICE DAILY BEFORE MEAL(S) 120 tablet 0  . HYDROcodone-acetaminophen (NORCO) 5-325 MG per tablet Take 1 tablet by mouth every 8 (eight) hours as needed for moderate pain. 60 tablet 0  . Insulin Glargine (BASAGLAR KWIKPEN) 100 UNIT/ML Solostar Pen INJECT 15 UNITS INTO THE SKIN AT BEDTIME. IF BLOOD SUGAR IS NOT LOWER THAN 150 IN THE AM, INCREASE BY 3 UNITS. 15 mL 2   No  facility-administered medications prior to visit.     Allergies:   Metformin and related and Tramadol   Social History   Social History  . Marital Status: Divorced    Spouse Name: N/A  . Number of Children: N/A  . Years of Education: N/A   Social History Main Topics  . Smoking status: Never Smoker   . Smokeless tobacco: Never Used  . Alcohol Use: No  . Drug Use: No  . Sexual Activity: Yes   Other Topics Concern  . None   Social History Narrative     Family History:  The patient's family history includes Alzheimer's disease in her mother; Diabetes in her father; Hypertension in her mother. There is no history of Anesthesia problems, Hypotension, Malignant hyperthermia, or Pseudochol deficiency.   ROS:   Please see the history of present illness.    ROS All other systems reviewed and are negative.   PHYSICAL EXAM:   VS:  BP 102/60 mmHg  Pulse 73  Ht 5\' 3"  (1.6 m)  Wt 119.795 kg (264 lb 1.6 oz)  BMI 46.79 kg/m2   GEN: Well nourished, well developed, in no acute distress HEENT: normal Neck: no JVD, carotid bruits, or masses Cardiac: RRR; no murmurs, rubs, or gallops,no edema  Respiratory:  clear to auscultation bilaterally, normal work of breathing GI: soft, nontender, nondistended, + BS MS: no deformity or atrophy Skin: warm and dry, no rash Neuro:  Alert and Oriented x 3, Strength and sensation are intact Psych: euthymic mood, full affect  Wt Readings from Last 3 Encounters:  04/11/15 119.795 kg (264 lb 1.6 oz)  12/07/14 120.657 kg (266 lb)  11/25/14 117.482 kg (259 lb)      Studies/Labs Reviewed:   EKG:  EKG is ordered today.  The ekg ordered today demonstrates normal sinus rhythm, normal tracing. No evidence of preexcitation, QTC 429 ms  Recent Labs: 08/23/2014: ALT 10 11/08/2014: Hemoglobin 11.4*; Platelets 466* 12/07/2014: BUN 28*; Creatinine, Ser 1.41*; Potassium 3.9; Sodium 138; TSH 0.28*   Lipid Panel    Component Value Date/Time   CHOL 175  12/07/2014 1534   TRIG 220.0* 12/07/2014 1534   HDL 30.10* 12/07/2014 1534   CHOLHDL 6 12/07/2014 1534   VLDL 44.0* 12/07/2014 1534   LDLDIRECT 127.0 12/07/2014 1534    ASSESSMENT:    1. SVT (supraventricular tachycardia) (HCC)   2. 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 Morbid obesity due to excess calories ([place])      PLAN:  In order of problems listed above:  1. 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 She expresses interest in EP study and radiofrequency ablation since this would enable her to stop taking beta blockers. Her symptoms are not particularly frequent, but it is reasonable to refer to electrophysiology. 2. 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 Obesity: Strongly encouraged to pursue weight loss to help with her diabetes and to lessen the likelihood of more complex atrial arrhythmias in the future 4. DM: Improved, but not yet perfect control. Last hemoglobin A1c 7.5%. Note that her ACE inhibitor as prescribed for diabetic nephropathy/microalbuminuria, not for hypertension. Target LDL should be less than 100 mg/deciliter. Reevaluate her lipid profile once she achieves good glycemic control    Medication Adjustments/Labs and Tests Ordered: Current medicines are reviewed at length with the patient today.  Concerns regarding medicines are outlined above.  Medication changes, Labs and Tests ordered today are listed in the Patient Instructions below. Patient Instructions  You have been referred to [facility].  Dr. Sallyanne Kuster recommends that you schedule a follow-up appointment in: ONE YEAR    SignedSanda Klein, MD  04/11/2015 5:23 PM    [address], [place], [place]  [address] Phone: [phone]; Fax: [phone]

## 2015-04-11 NOTE — Patient Instructions (Signed)
You have been referred to [facility].  Dr. Sallyanne Kuster recommends that you schedule a follow-up appointment in: [place]

## 2015-04-13 ENCOUNTER — Emergency Department (HOSPITAL_COMMUNITY): Payer: BC Managed Care – PPO

## 2015-04-13 ENCOUNTER — Emergency Department (HOSPITAL_COMMUNITY)
Admission: EM | Admit: 2015-04-13 | Discharge: 2015-04-13 | Disposition: A | Discharge status: Home / Self Care | Payer: BC Managed Care – PPO | Attending: Emergency Medicine | Admitting: Emergency Medicine

## 2015-04-13 ENCOUNTER — Encounter (HOSPITAL_COMMUNITY): Payer: Self-pay | Admitting: Emergency Medicine

## 2015-04-13 DIAGNOSIS — I10 Essential (primary) hypertension: Secondary | ICD-10-CM | POA: Diagnosis not present

## 2015-04-13 DIAGNOSIS — Z8669 Personal history of other diseases of the nervous system and sense organs: Secondary | ICD-10-CM | POA: Insufficient documentation

## 2015-04-13 DIAGNOSIS — I499 Cardiac arrhythmia, unspecified: Secondary | ICD-10-CM | POA: Diagnosis not present

## 2015-04-13 DIAGNOSIS — E119 Type 2 diabetes mellitus without complications: Secondary | ICD-10-CM | POA: Diagnosis not present

## 2015-04-13 DIAGNOSIS — Y9289 Other specified places as the place of occurrence of the external cause: Secondary | ICD-10-CM | POA: Insufficient documentation

## 2015-04-13 DIAGNOSIS — Z79899 Other long term (current) drug therapy: Secondary | ICD-10-CM | POA: Diagnosis not present

## 2015-04-13 DIAGNOSIS — W010XXA Fall on same level from slipping, tripping and stumbling without subsequent striking against object, initial encounter: Secondary | ICD-10-CM | POA: Diagnosis not present

## 2015-04-13 DIAGNOSIS — Y998 Other external cause status: Secondary | ICD-10-CM | POA: Diagnosis not present

## 2015-04-13 DIAGNOSIS — F419 Anxiety disorder, unspecified: Secondary | ICD-10-CM | POA: Insufficient documentation

## 2015-04-13 DIAGNOSIS — Z8719 Personal history of other diseases of the digestive system: Secondary | ICD-10-CM | POA: Diagnosis not present

## 2015-04-13 DIAGNOSIS — F329 Major depressive disorder, single episode, unspecified: Secondary | ICD-10-CM | POA: Diagnosis not present

## 2015-04-13 DIAGNOSIS — M79644 Pain in right finger(s): Secondary | ICD-10-CM

## 2015-04-13 DIAGNOSIS — Z872 Personal history of diseases of the skin and subcutaneous tissue: Secondary | ICD-10-CM | POA: Insufficient documentation

## 2015-04-13 DIAGNOSIS — S6991XA Unspecified injury of right wrist, hand and finger(s), initial encounter: Secondary | ICD-10-CM | POA: Insufficient documentation

## 2015-04-13 DIAGNOSIS — Y9389 Activity, other specified: Secondary | ICD-10-CM | POA: Diagnosis not present

## 2015-04-13 DIAGNOSIS — Z8709 Personal history of other diseases of the respiratory system: Secondary | ICD-10-CM | POA: Diagnosis not present

## 2015-04-13 DIAGNOSIS — Z7984 Long term (current) use of oral hypoglycemic drugs: Secondary | ICD-10-CM | POA: Diagnosis not present

## 2015-04-13 MED ORDER — ACETAMINOPHEN 325 MG PO TABS: 650.0000 mg | ORAL_TABLET | Freq: Four times a day (QID) | ORAL | Status: DC | PRN

## 2015-04-13 NOTE — Discharge Instructions (Signed)
Finger Sprain A finger sprain is a tear in one of the strong, fibrous tissues that connect the bones (ligaments) in your finger. The severity of the sprain depends on how much of the ligament is torn. The tear can be either partial or complete. CAUSES  Often, sprains are a result of a fall or accident. If you extend your hands to catch an object or to protect yourself, the force of the impact causes the fibers of your ligament to stretch too much. This excess tension causes the fibers of your ligament to tear. SYMPTOMS  You may have some loss of motion in your finger. Other symptoms include:  Bruising.  Tenderness.  Swelling. DIAGNOSIS  In order to diagnose finger sprain, your caregiver will physically examine your finger or thumb to determine how torn the ligament is. Your caregiver may also suggest an X-ray exam of your finger to make sure no bones are broken. TREATMENT  If your ligament is only partially torn, treatment usually involves keeping the finger in a fixed position (immobilization) for a short period. To do this, your caregiver will apply a bandage, cast, or splint to keep your finger from moving until it heals. For a partially torn ligament, the healing process usually takes 2 to 3 weeks. If your ligament is completely torn, you may need surgery to reconnect the ligament to the bone. After surgery a cast or splint will be applied and will need to stay on your finger or thumb for 4 to 6 weeks while your ligament heals. HOME CARE INSTRUCTIONS  Keep your injured finger elevated, when possible, to decrease swelling.  To ease pain and swelling, apply ice to your joint twice a day, for 2 to 3 days:  Put ice in a plastic bag.  Place a towel between your skin and the bag.  Leave the ice on for 15 minutes.  Only take over-the-counter or prescription medicine for pain as directed by your caregiver.  Do not wear rings on your injured finger.  Do not leave your finger unprotected  until pain and stiffness go away (usually 3 to 4 weeks).  Do not allow your cast or splint to get wet. Cover your cast or splint with a plastic bag when you shower or bathe. Do not swim.  Your caregiver may suggest special exercises for you to do during your recovery to prevent or limit permanent stiffness. SEEK IMMEDIATE MEDICAL CARE IF:  Your cast or splint becomes damaged.  Your pain becomes worse rather than better. MAKE SURE YOU:  Understand these instructions.  Will watch your condition.  Will get help right away if you are not doing well or get worse.   This information is not intended to replace advice given to you by your health care provider. Make sure you discuss any questions you have with your health care provider.   Document Released: 04/05/2004 Document Revised: 03/19/2014 Document Reviewed: 10/30/2010 Elsevier Interactive Patient Education 2016 [employer]  

## 2015-04-13 NOTE — ED Notes (Signed)
Rt hand pain see triage note

## 2015-04-13 NOTE — ED Provider Notes (Signed)
CSN: [id number]     Arrival date & time 04/13/15  1716 History  By signing my name below, I, Rowan Blase, attest that this documentation has been prepared under the direction and in the presence of non-physician practitioner, Waynetta Pean, PA-C. Electronically Signed: Rowan Blase, Scribe. 04/13/2015. 6:37 PM.   Chief Complaint  Patient presents with  . Fall  . Hand Pain   The history is provided by the patient. No language interpreter was used.   HPI Comments:  [name] is a 55 y.o. female with a PMHx of HTN and DM who presents to the Emergency Department complaining of 7/10, constant, right ring finger pain s/p fall yesterday. Pt states she tripped and fell, but she is unsure of mechanism of injury to finger. She reports some tingling to her right 4th finger. Pt notes she took Tylenol six hours ago with mild relief. Pt denies syncope, numbness, weakness, nausea, or abdominal pain. She denies any previous injury to this finger. She denies other injury.   Past Medical History  Diagnosis Date  . Abscess   . Sleep difficulties   . Fatigue   . Sinus problem   . Encounter for drainage of abscess     increased drainage from abscess on buttock  . Abscess of buttock   . Hypertension   . Anxiety   . Dysrhythmia     approx 3 years ago, increased heart rate,  . Depression     sees [name]  . Sleep apnea     2008- sleep study, neg. for sleep apnea   . Anal fistula   . Diabetes mellitus without complication [facility])    Past Surgical History  Procedure Laterality Date  . Knee arthroscopy      left  . Shoulder surgery  04/14/09    right  . Incise and drain abcess      abscess on right thigh and buttock  . Anal examination under anesthesia  02/21/11    anal fistula  . Breast surgery  patient does not remember date of procedure    pull fluid off lft br   Family History  Problem Relation Age of Onset  . Hypertension Mother   . Alzheimer's disease Mother   . Diabetes  Father   . Anesthesia problems Neg Hx   . Hypotension Neg Hx   . Malignant hyperthermia Neg Hx   . Pseudochol deficiency Neg Hx    Social History  Substance Use Topics  . Smoking status: Never Smoker   . Smokeless tobacco: Never Used  . Alcohol Use: No   OB History    Gravida Para Term Preterm AB TAB SAB Ectopic Multiple Living   1 1  1            Review of Systems  Constitutional: Negative for fever.  Gastrointestinal: Negative for nausea and abdominal pain.  Musculoskeletal: Positive for joint swelling and arthralgias. Negative for back pain and neck pain.  Skin: Negative for color change, rash and wound.  Neurological: Negative for syncope, weakness and numbness.   Allergies  Metformin and related and Tramadol  Home Medications   Prior to Admission medications   Medication Sig Start Date End Date Taking? Authorizing Provider  acetaminophen (TYLENOL) 325 MG tablet Take 2 tablets (650 mg total) by mouth every 6 (six) hours as needed for mild pain or moderate pain. 04/13/15   Waynetta Pean, PA-C  atorvastatin (LIPITOR) 20 MG tablet Take 1 tablet (20 mg total) by mouth at bedtime.  11/08/14   Lance Bosch, NP  BASAGLAR KWIKPEN 100 UNIT/ML Solostar Pen Inject 15 Units as directed at bedtime. 04/07/15   Historical Provider, MD  ferrous sulfate 325 (65 FE) MG tablet Take 1 tablet (325 mg total) by mouth 2 (two) times daily with a meal. 06/03/14   Lance Bosch, NP  FLUoxetine (PROZAC) 40 MG capsule Take 1 capsule (40 mg total) by mouth daily. 11/08/14   Lance Bosch, NP  furosemide (LASIX) 40 MG tablet Take 1 tablet (40 mg total) by mouth daily. Take lasix 5 days a week 11/19/14   Mihai Croitoru, MD  glipiZIDE (GLUCOTROL) 5 MG tablet TAKE TWO TABLETS BY MOUTH TWICE DAILY BEFORE MEAL(S) 11/24/14   Philemon Kingdom, MD  lisinopril (PRINIVIL,ZESTRIL) 10 MG tablet Take 1 tablet (10 mg total) by mouth daily. 10/13/14   Mihai Croitoru, MD  metFORMIN (GLUCOPHAGE) 500 MG tablet Take 1 tablet (500  mg total) by mouth 2 (two) times daily with a meal. 09/17/14   Philemon Kingdom, MD  metoprolol tartrate (LOPRESSOR) 25 MG tablet Take 1 tablet (25 mg total) by mouth 2 (two) times daily. 03/02/15   Lance Bosch, NP  omeprazole (PRILOSEC) 20 MG capsule Take 1 capsule (20 mg total) by mouth daily. 08/23/14   Lance Bosch, NP   BP 130/87 mmHg  Pulse 90  Temp(Src) 98 F (36.7 C) (Oral)  Resp 16  Ht 5\' 3"  (1.6 m)  Wt 113.853 kg  BMI 44.47 kg/m2  SpO2 98% Physical Exam  Constitutional: She appears well-developed and well-nourished. No distress.  Non-toxic appearing.   HENT:  Head: Normocephalic and atraumatic.  Eyes: Right eye exhibits no discharge. Left eye exhibits no discharge.  Cardiovascular: Normal rate, regular rhythm and intact distal pulses.   BL radial pulses intact; good capillary refill to right distal fingertips  Pulmonary/Chest: Effort normal. No respiratory distress.  Musculoskeletal: Normal range of motion. She exhibits edema and tenderness.  Mild edema over PIP joint of right 4th finger; no ecchymosis, no deformity. Able to make a fist.   Neurological: She is alert. Coordination normal.  Sensation intact to right distal fingertips  Skin: Skin is warm and dry. No rash noted. She is not diaphoretic. No erythema. No pallor.  Psychiatric: She has a normal mood and affect. Her behavior is normal.  Nursing note and vitals reviewed.   ED Course  Procedures  DIAGNOSTIC STUDIES:  Oxygen Saturation is 98% on RA, normal by my interpretation.    COORDINATION OF CARE:  6:28 PM Discussed imaging results. Will apply splint prior to discharge. Recommended follow-up. Discussed treatment plan with pt at bedside and pt agreed to plan.  Labs Review Labs Reviewed - No data to display  Imaging Review Dg Finger Ring Right  04/13/2015  CLINICAL DATA:  55 year old female with injury to the right fourth finger yesterday complaining of pain and swelling overlying the proximal phalanx.  EXAM: RIGHT RING FINGER 2+V COMPARISON:  No priors. FINDINGS: Multiple views of the right fourth finger demonstrate no acute displaced fracture, subluxation, dislocation, or soft tissue abnormality. IMPRESSION: No acute radiographic abnormality of the right fourth finger. Electronically Signed   By: Vinnie Langton M.D.   On: 04/13/2015 17:56   I have personally reviewed and evaluated these images as part of my medical decision-making.   EKG Interpretation None      Filed Vitals:   04/13/15 1724  BP: 130/87  Pulse: 90  Temp: 98 F (36.7 C)  TempSrc: Oral  Resp: 16  Height: 5\' 3"  (1.6 m)  Weight: 113.853 kg  SpO2: 98%     MDM   Meds given in ED:  Medications - No data to display  New Prescriptions   ACETAMINOPHEN (TYLENOL) 325 MG TABLET    Take 2 tablets (650 mg total) by mouth every 6 (six) hours as needed for mild pain or moderate pain.    Final diagnoses:  Finger pain, right   This  is a 55 y.o. female who presents to the Emergency Department complaining of 7/10, constant, right ring finger pain s/p fall yesterday. Pt states she tripped and fell, but she is unsure of mechanism of injury to finger.  on exam the patient is afebrile nontoxic appearing. She denies other injury. She has mild edema over her PIP joint. No deformity or ecchymosis. Good range of motion of her right fourth finger. X-ray is unremarkable. At patient's request we'll provide the patient with finger splint and have her follow-up with primary care. I advised if her pain persists she can follow-up with orthopedic hand surgery and I provided her with follow-up with Dr. Caralyn Guile. I advised the patient to follow-up with their primary care provider this week. I advised the patient to return to the emergency department with new or worsening symptoms or new concerns. The patient verbalized understanding and agreement with plan.    I personally performed the services described in this documentation, which was scribed  in my presence. The recorded information has been reviewed and is accurate.        Waynetta Pean, PA-C [phone] [id number]  Delora Fuel, MD [id number] [id number]

## 2015-04-13 NOTE — ED Notes (Signed)
Finger splint  Placed per the pts request

## 2015-04-13 NOTE — ED Notes (Addendum)
Pt from home for eval of right ring finger pain following fall yesterday. Pt states hip gave out and fell, denies hitting head or LOC, states been seen for hip pain prior and no new complaints. Pt denies taking any blood thinners. Pulses present, swelling noted but full ROM to right right finger.

## 2015-04-14 ENCOUNTER — Ambulatory Visit: Payer: BC Managed Care – PPO | Admitting: Internal Medicine

## 2015-04-15 ENCOUNTER — Encounter: Payer: Self-pay | Admitting: Cardiology

## 2015-04-15 ENCOUNTER — Ambulatory Visit (INDEPENDENT_AMBULATORY_CARE_PROVIDER_SITE_OTHER): Payer: BC Managed Care – PPO | Admitting: Internal Medicine

## 2015-04-15 ENCOUNTER — Encounter: Payer: Self-pay | Admitting: *Deleted

## 2015-04-15 ENCOUNTER — Encounter: Payer: Self-pay | Admitting: Internal Medicine

## 2015-04-15 ENCOUNTER — Ambulatory Visit (INDEPENDENT_AMBULATORY_CARE_PROVIDER_SITE_OTHER): Payer: BC Managed Care – PPO | Admitting: Cardiology

## 2015-04-15 VITALS — BP 102/64 | HR 68 | Ht 63.0 in | Wt 264.6 lb

## 2015-04-15 VITALS — BP 108/62 | HR 69 | Temp 97.8°F | Ht 62.75 in | Wt 263.1 lb

## 2015-04-15 DIAGNOSIS — R7989 Other specified abnormal findings of blood chemistry: Secondary | ICD-10-CM

## 2015-04-15 DIAGNOSIS — Z01812 Encounter for preprocedural laboratory examination: Secondary | ICD-10-CM | POA: Diagnosis not present

## 2015-04-15 DIAGNOSIS — Z794 Long term (current) use of insulin: Secondary | ICD-10-CM

## 2015-04-15 DIAGNOSIS — E042 Nontoxic multinodular goiter: Secondary | ICD-10-CM | POA: Diagnosis not present

## 2015-04-15 DIAGNOSIS — I471 Supraventricular tachycardia: Secondary | ICD-10-CM | POA: Diagnosis not present

## 2015-04-15 DIAGNOSIS — E1142 Type 2 diabetes mellitus with diabetic polyneuropathy: Secondary | ICD-10-CM

## 2015-04-15 DIAGNOSIS — R946 Abnormal results of thyroid function studies: Secondary | ICD-10-CM

## 2015-04-15 LAB — TSH: TSH: 0.49 u[IU]/mL (ref 0.35–4.50)

## 2015-04-15 LAB — T4, FREE: Free T4: 0.98 ng/dL (ref 0.60–1.60)

## 2015-04-15 LAB — T3, FREE: T3 FREE: 3.1 pg/mL (ref 2.3–4.2)

## 2015-04-15 LAB — HEMOGLOBIN A1C: HEMOGLOBIN A1C: 9.9 % — AB (ref 4.6–6.5)

## 2015-04-15 MED ORDER — GLIPIZIDE 5 MG PO TABS: ORAL_TABLET | ORAL | Status: DC

## 2015-04-15 MED ORDER — METFORMIN HCL 500 MG PO TABS: 500.0000 mg | ORAL_TABLET | Freq: Two times a day (BID) | ORAL | Status: DC

## 2015-04-15 MED ORDER — EMPAGLIFLOZIN 10 MG PO TABS: 10.0000 mg | ORAL_TABLET | Freq: Every day | ORAL | Status: DC

## 2015-04-15 NOTE — Patient Instructions (Signed)
Please continue: - Basaglar 18 units at bedtime - Metformin 500 mg 2x a day - Glipizide 10 mg 2x a day.  Please add: - Jardiance 10 mg in am  Please let me know if the sugars are consistently <80 or >200.  Please come back for a follow-up appointment in 3 months.

## 2015-04-15 NOTE — Progress Notes (Signed)
Electrophysiology Office Note   Date:  04/15/2015   ID:  Zoya Zogg, DOB 05/09/1960, MRN [id number]  PCP:  Lance Bosch, NP  Cardiologist:  [facility] Primary Electrophysiologist:  Jamonica Schoff Meredith Leeds, MD    Chief Complaint  Patient presents with  . Advice Only     History of Present Illness: [name] is a 55 y.o. female who presents today for electrophysiology evaluation.   She has a history of hypertension, OSA and diabetes who presents with SVT.  It terminates with valsalva.  She was started on metoprolol but continues to have symptoms.  When she does have the palpitations, she denies dyspnea, angina, or dizziness.  She says that when she is not having episodes she feels well. There is no increasing or alleviating factor other than bearing down. She has required adenosine in the past in the emergency room. It has been difficult to treat her medications as her heart rate and blood pressure have been low in the past.   Today, she denies symptoms of chest pain, shortness of breath, orthopnea, PND, lower extremity edema, claudication, dizziness, presyncope, syncope, bleeding, or neurologic sequela. The patient is tolerating medications without difficulties and is otherwise without complaint today.    Past Medical History  Diagnosis Date  . Abscess   . Sleep difficulties   . Fatigue   . Sinus problem   . Encounter for drainage of abscess     increased drainage from abscess on buttock  . Abscess of buttock   . Hypertension   . Anxiety   . Dysrhythmia     approx 3 years ago, increased heart rate,  . Depression     sees [name]  . Sleep apnea     2008- sleep study, neg. for sleep apnea   . Anal fistula   . Diabetes mellitus without complication [facility])    Past Surgical History  Procedure Laterality Date  . Knee arthroscopy      left  . Shoulder surgery  04/14/09    right  . Incise and drain abcess      abscess on right thigh and buttock  . Anal  examination under anesthesia  02/21/11    anal fistula  . Breast surgery  patient does not remember date of procedure    pull fluid off lft br     Current Outpatient Prescriptions  Medication Sig Dispense Refill  . acetaminophen (TYLENOL) 325 MG tablet Take 2 tablets (650 mg total) by mouth every 6 (six) hours as needed for mild pain or moderate pain. 30 tablet 0  . atorvastatin (LIPITOR) 20 MG tablet Take 1 tablet (20 mg total) by mouth at bedtime. 30 tablet 5  . ferrous sulfate 325 (65 FE) MG tablet Take 1 tablet (325 mg total) by mouth 2 (two) times daily with a meal. 60 tablet 2  . furosemide (LASIX) 40 MG tablet Take 40 mg by mouth daily.    Marland Kitchen glipiZIDE (GLUCOTROL) 5 MG tablet TAKE TWO TABLETS BY MOUTH TWICE DAILY BEFORE MEAL(S) 120 tablet 2  . Insulin Glargine (BASAGLAR KWIKPEN) 100 UNIT/ML Solostar Pen Inject 14 Units into the skin daily at 10 pm.    . lisinopril (PRINIVIL,ZESTRIL) 10 MG tablet Take 1 tablet (10 mg total) by mouth daily. 30 tablet 6  . metFORMIN (GLUCOPHAGE) 500 MG tablet Take 1 tablet (500 mg total) by mouth 2 (two) times daily with a meal. 90 tablet 3  . metoprolol tartrate (LOPRESSOR) 25 MG tablet Take 1 tablet (  25 mg total) by mouth 2 (two) times daily. 60 tablet 3  . omeprazole (PRILOSEC) 20 MG capsule Take 1 capsule (20 mg total) by mouth daily. 30 capsule 3   No current facility-administered medications for this visit.    Allergies:   Metformin and related and Tramadol   Social History:  The patient  reports that she has never smoked. She has never used smokeless tobacco. She reports that she does not drink alcohol or use illicit drugs.   Family History:  The patient's family history includes Alzheimer's disease in her mother; Diabetes in her father; Hypertension in her mother. There is no history of Anesthesia problems, Hypotension, Malignant hyperthermia, or Pseudochol deficiency.    ROS:  Please see the history of present illness.   Otherwise, review of  systems is positive for palpitations, snoring, DOE.   All other systems are reviewed and negative.    PHYSICAL EXAM: VS:  BP 102/64 mmHg  Pulse 68  Ht 5\' 3"  (1.6 m)  Wt 264 lb 9.6 oz (120.022 kg)  BMI 46.88 kg/m2 , BMI Body mass index is 46.88 kg/(m^2). GEN: Well nourished, well developed, in no acute distress HEENT: normal Neck: no JVD, carotid bruits, or masses Cardiac: RRR; no murmurs, rubs, or gallops,no edema  Respiratory:  clear to auscultation bilaterally, normal work of breathing GI: soft, nontender, nondistended, + BS MS: no deformity or atrophy Skin: warm and dry Neuro:  Strength and sensation are intact Psych: euthymic mood, full affect  EKG:  EKG is not ordered today.  Recent Labs: 08/23/2014: ALT 10 11/08/2014: Hemoglobin 11.4*; Platelets 466* 12/07/2014: BUN 28*; Creatinine, Ser 1.41*; Potassium 3.9; Sodium 138; TSH 0.28*    Lipid Panel     Component Value Date/Time   CHOL 175 12/07/2014 1534   TRIG 220.0* 12/07/2014 1534   HDL 30.10* 12/07/2014 1534   CHOLHDL 6 12/07/2014 1534   VLDL 44.0* 12/07/2014 1534   LDLDIRECT 127.0 12/07/2014 1534     Wt Readings from Last 3 Encounters:  04/15/15 264 lb 9.6 oz (120.022 kg)  04/13/15 251 lb (113.853 kg)  04/11/15 264 lb 1.6 oz (119.795 kg)      Other studies Reviewed: Additional studies/ records that were reviewed today include: 09/07/14 TTE  Review of the above records today demonstrates:  - Normal study, minimally increased LVOT velocity.   ASSESSMENT AND PLAN:  1.  SVT: Per report she has had a narrow complex tachycardia. Her tachycardia has responded to adenosine in the past which has broken the tachycardia. It is likely that this has an AV nodal component, AVNRT or ORT. I have discussed with her options of management. She says that it is difficult to treat her with medications as her blood pressure is low and her pulse is low. I discussed with her the option of ablation she is interested. I told her the  risks of the procedure which include bleeding, tamponade, heart block, and stroke. She understands these risks and we Issiah Huffaker schedule the procedure soon as possible.  Current medicines are reviewed at length with the patient today.   The patient does not have concerns regarding her medicines.  The following changes were made today:  none  Labs/ tests ordered today include:  No orders of the defined types were placed in this encounter.     Disposition:   FU with Valeda Corzine post ablation  Signed, Cian Costanzo Meredith Leeds, MD  04/15/2015 12:10 PM     [facility]  [address] [place] [address] [phone] (office) [phone] (fax)

## 2015-04-15 NOTE — Progress Notes (Signed)
Pre visit review using our clinic review tool, if applicable. No additional management support is needed unless otherwise documented below in the visit note. 

## 2015-04-15 NOTE — Patient Instructions (Signed)
Medication Instructions:  Your physician recommends that you continue on your current medications as directed. Please refer to the Current Medication list given to you today.  Labwork: Your physician recommends that you schedule a follow-up appointment the week of 2/13-2/17 for pre procedure lab work.  Testing/Procedures: Your physician has recommended that you have an ablation. Catheter ablation is a medical procedure used to treat some cardiac arrhythmias (irregular heartbeats). During catheter ablation, a long, thin, flexible tube is put into a blood vessel in your groin (upper thigh), or neck. This tube is called an ablation catheter. It is then guided to your heart through the blood vessel. Radio frequency waves destroy small areas of heart tissue where abnormal heartbeats may cause an arrhythmia to start. Please see the instruction sheet given to you today.  Follow-Up: Your physician recommends that you schedule a follow-up appointment in: 4 weeks, post ablation on 05/05/15, with Amber/Renee.  Your physician recommends that you schedule a follow-up appointment in: 3 months, post ablation on 05/05/15, with Dr. Curt Bears.  If you need a refill on your cardiac medications before your next appointment, please call your pharmacy.  Thank you for choosing [facility]!!   Theresa Curet, RN [phone]    Cardiac Ablation Cardiac ablation is a procedure to disable a small amount of heart tissue in very specific places. The heart has many electrical connections. Sometimes these connections are abnormal and can cause the heart to beat very fast or irregularly. By disabling some of the problem areas, heart rhythm can be improved or made normal. Ablation is done for people who:   Have Wolff-Parkinson-White syndrome.   Have other fast heart rhythms (tachycardia).   Have taken medicines for an abnormal heart rhythm (arrhythmia) that resulted in:   No success.   Side effects.   May  have a high-risk heartbeat that could result in death.  LET [facility] CARE PROVIDER KNOW ABOUT:   Any allergies you have or any previous reactions you have had to X-ray dye, food (such as seafood), medicine, or tape.   All medicines you are taking, including vitamins, herbs, eye drops, creams, and over-the-counter medicines.   Previous problems you or members of your family have had with the use of anesthetics.   Any blood disorders you have.   Previous surgeries or procedures (such as a kidney transplant) you have had.   Medical conditions you have (such as kidney failure).  RISKS AND COMPLICATIONS Generally, cardiac ablation is a safe procedure. However, problems can occur and include:   Increased risk of cancer. Depending on how long it takes to do the ablation, the dose of radiation can be high.  Bruising and bleeding where a thin, flexible tube (catheter) was inserted during the procedure.   Bleeding into the chest, especially into the sac that surrounds the heart (serious).  Need for a permanent pacemaker if the normal electrical system is damaged.   The procedure may not be fully effective, and this may not be recognized for months. Repeat ablation procedures are sometimes required. BEFORE THE PROCEDURE   Follow any instructions from your health care provider regarding eating and drinking before the procedure.   Take your medicines as directed at regular times with water, unless instructed otherwise by your health care provider. If you are taking diabetes medicine, including insulin, ask how you are to take it and if there are any special instructions you should follow. It is common to adjust insulin dosing the day of the ablation.  PROCEDURE  An ablation is usually performed in a catheterization laboratory with the guidance of fluoroscopy. Fluoroscopy is a type of X-ray that helps your health care provider see images of your heart during the procedure.   An  ablation is a minimally invasive procedure. This means a small cut (incision) is made in either your neck or groin. Your health care provider will decide where to make the incision based on your medical history and physical exam.  An IV tube will be started before the procedure begins. You will be given an anesthetic or medicine to help you relax (sedative).  The skin on your neck or groin will be numbed. A needle will be inserted into a large vein in your neck or groin and catheters will be threaded to your heart.  A special dye that shows up on fluoroscopy pictures may be injected through the catheter. The dye helps your health care provider see the area of the heart that needs treatment.  The catheter has electrodes on the tip. When the area of heart tissue that is causing the arrhythmia is found, the catheter tip will send an electrical current to the area and "scar" the tissue. Three types of energy can be used to ablate the heart tissue:   Heat (radiofrequency energy).   Laser energy.   Extreme cold (cryoablation).   When the area of the heart has been ablated, the catheter will be taken out. Pressure will be held on the insertion site. This will help the insertion site clot and keep it from bleeding. A bandage will be placed on the insertion site.  AFTER THE PROCEDURE   After the procedure, you will be taken to a recovery area where your vital signs (blood pressure, heart rate, and breathing) will be monitored. The insertion site will also be monitored for bleeding.   You will need to lie still for 4-6 hours. This is to ensure you do not bleed from the catheter insertion site.    This information is not intended to replace advice given to you by your health care provider. Make sure you discuss any questions you have with your health care provider.   Document Released: 07/15/2008 Document Revised: 03/19/2014 Document Reviewed: 07/21/2012 Elsevier Interactive Patient Education  [employer].

## 2015-04-15 NOTE — Progress Notes (Signed)
Patient ID: [name], female   DOB: 1960-11-04, 55 y.o.   MRN: [id number]   HPI  [name] is a 55 y.o.-year-old female, returning for f/u for 2 R thyroid nodule. She would also want me to address her DM2, dx 2011, insulin-dependent since 123456, without complications.  Thyroid nodules: Pt had a PET scan (09/07/2014) (to investigate an ovarian mass) that showed 2 hyperactive thyroid nodules in the R thyroid lobe:  No hypermetabolic lymph nodes in the neck.  2.5 cm hypermetabolic right thyroid nodule, max SUV 6.3.  1.5 cm midline inferior right thyroid nodule, max SUV 4.8.   Thyroid uptake and scan (03/09/2015):  The 24 hour radioactive iodine uptake is equal to 13.9% (10% to 30%).  There is heterogeneous scratch set there is overall decreased uptake within both lobes. Several medium size foci of mild uptake are noted within the upper poles of both lobes as well as the inferior pole the right lobe.  Bx'es of the 2 R nodules (123456): SCANT FOLLICULAR EPITHELIUM PRESENT (BETHESDA CATEGORY I). The left nodule could not be seen on pre-Bx U/S.   I reviewed pt's thyroid tests: Lab Results  Component Value Date   TSH 0.28* 12/07/2014   TSH 0.57 09/17/2014   TSH 0.416 08/23/2014   TSH 0.287* 05/20/2014   FREET4 0.75 12/07/2014   FREET4 0.82 09/17/2014   FREET4 0.76* 08/23/2014    Pt denies feeling nodules in neck, + hoarseness, no dysphagia/odynophagia, SOB with lying down. + occasional choking when drinks with a straw.   Pt mentions: - + heat intolerance - no tremors - no palpitations - no hyperdefecation/constipation - no weight changes - no dry skin - no hair falling - no problems with concentration - no fatigue  DM2: Last hemoglobin A1c was: Lab Results  Component Value Date   HGBA1C 7.5* 12/07/2014   HGBA1C 6.50 08/23/2014   HGBA1C 7.90 05/20/2014   Pt is on a regimen of: - Metformin 500 mg 2x a day  - started 09/2014. - Glipizide 10 mg 2x a  day - Basaglar 18 units at bedtime >> started 04/08/2015 - we had to start this as her sugars greatly increased after last visit after she stopped Valium  She stopped Levemir 45 units at bedtime She was on Januvia in 03/2014 >> N/V >> stopped.  Pt checks her sugars 2x a day and they are: - am: 80-100, 180-220 in last week >> 78-110 >> 360s >> 130-160 - 2h after b'fast: n/c - before lunch: n/c - 2h after lunch: n/c >> 190-200 - before dinner: n/c - 2h after dinner: n/c >> 140-180  >> n/c - bedtime: n/c >> 120-130 >> n/c - nighttime: n/c No lows. Lowest sugar was 78 >> 130; she has hypoglycemia awareness at 70.  Highest sugar was 180 >> 300s.  Glucometer: ReliOn  Pt's meals are: - Breakfast: skips - Lunch: sandwich, carrots + dip or crackers - Dinner: chicken (baked) + veggies  - Snacks: 2 small cookies before bedtime No sodas.   - + CKD stage 2, last BUN/creatinine:  Lab Results  Component Value Date   BUN 28* 12/07/2014   CREATININE 1.41* 12/07/2014  On Lisinopril. - last set of lipids: Lab Results  Component Value Date   CHOL 175 12/07/2014   HDL 30.10* 12/07/2014   LDLDIRECT 127.0 12/07/2014   TRIG 220.0* 12/07/2014   CHOLHDL 6 12/07/2014    On Lipitor 20 mg daily. - last eye exam was: Orders Only on 03/15/2015  Component Date Value Ref Range Status  . HM Diabetic Eye Exam 03/08/2015 No Retinopathy  No Retinopathy Final   - no numbness and tingling in her feet.  ROS: Constitutional: see history of present illness Eyes: no blurry vision, no xerophthalmia ENT: no sore throat, no nodules palpated in throat, no dysphagia/odynophagia, + hoarseness Cardiovascular: no CP/SOB/palpitations/leg swelling Respiratory: no cough/SOB Gastrointestinal: no N/V/D/C Musculoskeletal: no muscle/joint aches Skin: no rashes Neurological: no tremors/numbness/tingling/dizziness  I reviewed pt's medications, allergies, PMH, social hx, family hx, and changes were documented in the  history of present illness. Otherwise, unchanged from my initial visit note. Past Medical History  Diagnosis Date  . Abscess   . Sleep difficulties   . Fatigue   . Sinus problem   . Encounter for drainage of abscess     increased drainage from abscess on buttock  . Abscess of buttock   . Hypertension   . Anxiety   . Dysrhythmia     approx 3 years ago, increased heart rate,  . Depression     sees [name]  . Sleep apnea     2008- sleep study, neg. for sleep apnea   . Anal fistula   . Diabetes mellitus without complication [facility])    Past Surgical History  Procedure Laterality Date  . Knee arthroscopy      left  . Shoulder surgery  04/14/09    right  . Incise and drain abcess      abscess on right thigh and buttock  . Anal examination under anesthesia  02/21/11    anal fistula  . Breast surgery  patient does not remember date of procedure    pull fluid off lft br   History   Social History  . Marital Status: Divorced    Spouse Name: N/A   Social History Main Topics  . Smoking status: Never Smoker   . Smokeless tobacco: Never Used  . Alcohol Use: No  . Drug Use: No   Current Outpatient Prescriptions on File Prior to Visit  Medication Sig Dispense Refill  . acetaminophen (TYLENOL) 325 MG tablet Take 2 tablets (650 mg total) by mouth every 6 (six) hours as needed for mild pain or moderate pain. 30 tablet 0  . atorvastatin (LIPITOR) 20 MG tablet Take 1 tablet (20 mg total) by mouth at bedtime. 30 tablet 5  . ferrous sulfate 325 (65 FE) MG tablet Take 1 tablet (325 mg total) by mouth 2 (two) times daily with a meal. 60 tablet 2  . glipiZIDE (GLUCOTROL) 5 MG tablet TAKE TWO TABLETS BY MOUTH TWICE DAILY BEFORE MEAL(S) 120 tablet 2  . lisinopril (PRINIVIL,ZESTRIL) 10 MG tablet Take 1 tablet (10 mg total) by mouth daily. 30 tablet 6  . metFORMIN (GLUCOPHAGE) 500 MG tablet Take 1 tablet (500 mg total) by mouth 2 (two) times daily with a meal. 90 tablet 3  . metoprolol tartrate  (LOPRESSOR) 25 MG tablet Take 1 tablet (25 mg total) by mouth 2 (two) times daily. 60 tablet 3  . omeprazole (PRILOSEC) 20 MG capsule Take 1 capsule (20 mg total) by mouth daily. 30 capsule 3   No current facility-administered medications on file prior to visit.   Allergies  Allergen Reactions  . Metformin And Related Nausea Only    Hands and feet tingling  . Tramadol Nausea Only   Family History  Problem Relation Age of Onset  . Hypertension Mother   . Alzheimer's disease Mother   . Diabetes Father   .  Anesthesia problems Neg Hx   . Hypotension Neg Hx   . Malignant hyperthermia Neg Hx   . Pseudochol deficiency Neg Hx    PE: BP 108/62 mmHg  Pulse 69  Temp(Src) 97.8 F (36.6 C) (Oral)  Ht 5' 2.75" (1.594 m)  Wt 263 lb 2 oz (119.353 kg)  BMI 46.97 kg/m2  SpO2 98% Body mass index is 46.97 kg/(m^2).  Wt Readings from Last 3 Encounters:  04/15/15 263 lb 2 oz (119.353 kg)  04/15/15 264 lb 9.6 oz (120.022 kg)  04/13/15 251 lb (113.853 kg)   Constitutional: overweight, in NAD Eyes: PERRLA, EOMI, no exophthalmos ENT: moist mucous membranes, + thyromegaly, no cervical lymphadenopathy Cardiovascular: RRR, No MRG Respiratory: CTA B Gastrointestinal: abdomen soft, NT, ND, BS+ Musculoskeletal: no deformities, strength intact in all 4;  Skin: moist, warm, no rashes Neurological: no tremor with outstretched hands, DTR normal in all 4  ASSESSMENT: 1. 2 thyroid nodules, R thyroid lobe - hyperactive on PET scan   Thyroid uptake and scan (03/09/2015):  The 24 hour radioactive iodine uptake is equal to 13.9% (10% to 30%).  There is heterogeneous scratch set there is overall decreased uptake within both lobes. Several medium size foci of mild uptake are noted within the upper poles of both lobes as well as the inferior pole the right lobe.  IMPRESSION: 1. Normal 24 hour radioactive iodine uptake. 2. Multi nodular thyroid gland. 3. Regarding the hypermetabolic nodule on PET-CT  from 09/07/2014. Hypermetabolic thyroid nodules on PET have up to 40-50% incidence of malignancy; recommend further evaluation with thyroid ultrasound and possible US-guided fine needle aspiration.  Thyroid U/S (03/18/2015):  Right thyroid lobe: 6.4 x 3.2 x 3.8 cm. Heterogeneous tissue. Large lower pole mass measures 4.8 x 2.7 x 3.0 cm.   Left thyroid lobe: 6.3 x 2.7 x 2.8 cm. Heterogeneous tissue. Large mass in the lower pole measures 4.8 x 3.1 x 5.1 cm. Sub cm mid and upper left lobe nodules are noted.    Isthmus thickness: 2.5 cm. Right isthmic solid nodule measures 1.8 x 1.3 x 1.6 cm. It is heterogeneous with some internal vascularity.   Lymphadenopathy: None visualized.  IMPRESSION: Multiple nodules as described. Bilateral lower pole dominant nodules meet criteria for biopsy.   - L nodule was not demonstrated on pre-Bx U/s Adequacy Reason Satisfactory For Evaluation. Diagnosis THYROID, FINE NEEDLE ASPIRATION ISTHMUS, SPECIMEN 1 OF 2, COLLECTED ON Q000111Q): SCANT FOLLICULAR EPITHELIUM PRESENT (BETHESDA CATEGORY I). Casimer Lanius MD Pathologist, Electronic Signature (Case signed 04/01/2015) Specimen Clinical Information Right isthmic solid nodule measures 1.8 x 1.3 x 1.6 cm, It is heterogeneous with some internal vascularity Source Thyroid, Fine Needle Aspiration, Isthmus, (Specimen 1 of 2, collected on 03/31/2015)  Adequacy Reason Satisfactory For Evaluation. Diagnosis THYROID, FINE NEEDLE ASPIRATION RLP, (SPECIMEN 2 OF 2, COLLECTED ON Q000111Q): SCANT FOLLICULAR EPITHELIUM PRESENT (BETHESDA CATEGORY I). Casimer Lanius MD Pathologist, Electronic Signature (Case signed 04/01/2015) Specimen Clinical Information Large right lower pole mass measures 4.8 x 2.7 x 3.0 cm Source Thyroid, Fine Needle Aspiration, RLP, (Specimen 2 of 2, collected on 03/31/2015)  Benign biopsies. We may need to repeat them though as they showed scant epithelium cells.  2. Low TSH  3. DM2,  insulin-dependent, uncontrolled, without complications  PLAN: 1. Thyroid nodules - I reviewed the report of her PET scan along with the patient, U/S, and the recent Bx results with the pt.Her 2 R nodules were hypermetabolic on PET >> but the Bx were negative for cancer. However, the sample  showed scant cellularity for both nodules >> we will need a new U/S and possible Bx in 1 year.  - No neck compression sxs but has occas. hoarseness  2. Patient did have a low TSH x2 last year.  - a thyroid uptake and scan showed only mild increased uptake in R lower lobe and upper part of both lobes - will repeat her thyroid tests, TSH, free T4, free T3 today  2. DM2, insulin-dependent, uncontrolled, without complications - reviewed the last hemoglobin A1c, which was higher - we'll repeat the hemoglobin A1c today - Since last visit, we had to start Insulin (on 04/08/2015) as sugars skyrocketed after she stopped the Xanax. She is still not sleeping well and feels depressed. Sugars are better after starting insulin, but not at target. Will add an SGLT2 inh. Advised her of poss SEs.  Patient Instructions  Please continue: - Basaglar 18 units at bedtime - Metformin 500 mg 2x a day - Glipizide 10 mg 2x a day.  Please add: - Jardiance 10 mg in am  Please let me know if the sugars are consistently <80 or >200.  Please come back for a follow-up appointment in 3 months.  - continue to check sugars at different times of the day - check 2 times a day, rotating checks - advised for yearly eye exams.  - will BMP at next visit after starting Jardiance - Return to clinic in 3 mo with sugar log   Office Visit on 04/15/2015  Component Date Value Ref Range Status  . Free T4 04/15/2015 0.98  0.60 - 1.60 ng/dL Final  . T3, Free 04/15/2015 3.1  2.3 - 4.2 pg/mL Final  . TSH 04/15/2015 0.49  0.35 - 4.50 uIU/mL Final  . Hgb A1c MFr Bld 04/15/2015 9.9* 4.6 - 6.5 % Final   Glycemic Control Guidelines for People with  Diabetes:Non Diabetic:  <6%Goal of Therapy: <7%Additional Action Suggested:  >8%    TFTs normal. Hemoglobin A1c higher, as expected, but she started insulin only a week ago.

## 2015-04-25 ENCOUNTER — Other Ambulatory Visit (INDEPENDENT_AMBULATORY_CARE_PROVIDER_SITE_OTHER): Payer: BC Managed Care – PPO | Admitting: *Deleted

## 2015-04-25 ENCOUNTER — Encounter: Payer: Self-pay | Admitting: *Deleted

## 2015-04-25 DIAGNOSIS — Z01812 Encounter for preprocedural laboratory examination: Secondary | ICD-10-CM | POA: Diagnosis not present

## 2015-04-25 DIAGNOSIS — I471 Supraventricular tachycardia: Secondary | ICD-10-CM | POA: Diagnosis not present

## 2015-04-25 NOTE — Addendum Note (Signed)
Addended by: Eulis Foster on: 04/25/2015 12:27 PM   Modules accepted: Orders

## 2015-04-26 LAB — BASIC METABOLIC PANEL
BUN: 27 mg/dL — AB (ref 7–25)
CHLORIDE: 104 mmol/L (ref 98–110)
CO2: 22 mmol/L (ref 20–31)
CREATININE: 1.57 mg/dL — AB (ref 0.50–1.05)
Calcium: 9.2 mg/dL (ref 8.6–10.4)
GLUCOSE: 153 mg/dL — AB (ref 65–99)
POTASSIUM: 3.9 mmol/L (ref 3.5–5.3)
Sodium: 139 mmol/L (ref 135–146)

## 2015-04-26 LAB — CBC WITH DIFFERENTIAL/PLATELET
BASOS ABS: 0.1 10*3/uL (ref 0.0–0.1)
Basophils Relative: 1 % (ref 0–1)
Eosinophils Absolute: 0.1 10*3/uL (ref 0.0–0.7)
Eosinophils Relative: 1 % (ref 0–5)
HEMATOCRIT: 35.1 % — AB (ref 36.0–46.0)
HEMOGLOBIN: 11.2 g/dL — AB (ref 12.0–15.0)
LYMPHS ABS: 3 10*3/uL (ref 0.7–4.0)
LYMPHS PCT: 23 % (ref 12–46)
MCH: 25.3 pg — ABNORMAL LOW (ref 26.0–34.0)
MCHC: 31.9 g/dL (ref 30.0–36.0)
MCV: 79.4 fL (ref 78.0–100.0)
MPV: 11.2 fL (ref 8.6–12.4)
Monocytes Absolute: 0.7 10*3/uL (ref 0.1–1.0)
Monocytes Relative: 5 % (ref 3–12)
NEUTROS ABS: 9.2 10*3/uL — AB (ref 1.7–7.7)
Neutrophils Relative %: 70 % (ref 43–77)
Platelets: 412 10*3/uL — ABNORMAL HIGH (ref 150–400)
RBC: 4.42 MIL/uL (ref 3.87–5.11)
RDW: 14.4 % (ref 11.5–15.5)
WBC: 13.2 10*3/uL — AB (ref 4.0–10.5)

## 2015-05-05 ENCOUNTER — Encounter (HOSPITAL_COMMUNITY)
Admission: RE | Disposition: A | Discharge status: Home / Self Care | Payer: Self-pay | Source: Ambulatory Visit | Attending: Cardiology

## 2015-05-05 ENCOUNTER — Encounter (HOSPITAL_COMMUNITY): Payer: Self-pay | Admitting: Nurse Practitioner

## 2015-05-05 ENCOUNTER — Ambulatory Visit (HOSPITAL_COMMUNITY)
Admission: RE | Admit: 2015-05-05 | Discharge: 2015-05-06 | Disposition: A | Discharge status: Home / Self Care | Payer: BC Managed Care – PPO | Source: Ambulatory Visit | Attending: Cardiology | Admitting: Cardiology

## 2015-05-05 DIAGNOSIS — E119 Type 2 diabetes mellitus without complications: Secondary | ICD-10-CM | POA: Diagnosis not present

## 2015-05-05 DIAGNOSIS — I10 Essential (primary) hypertension: Secondary | ICD-10-CM | POA: Insufficient documentation

## 2015-05-05 DIAGNOSIS — Z794 Long term (current) use of insulin: Secondary | ICD-10-CM | POA: Insufficient documentation

## 2015-05-05 DIAGNOSIS — G4733 Obstructive sleep apnea (adult) (pediatric): Secondary | ICD-10-CM | POA: Diagnosis not present

## 2015-05-05 DIAGNOSIS — I471 Supraventricular tachycardia: Secondary | ICD-10-CM | POA: Diagnosis present

## 2015-05-05 HISTORY — DX: Supraventricular tachycardia, unspecified: I47.10

## 2015-05-05 HISTORY — DX: Supraventricular tachycardia: I47.1

## 2015-05-05 HISTORY — PX: ELECTROPHYSIOLOGIC STUDY: SHX172A

## 2015-05-05 LAB — GLUCOSE, CAPILLARY
GLUCOSE-CAPILLARY: 88 mg/dL (ref 65–99)
GLUCOSE-CAPILLARY: 81 mg/dL (ref 65–99)
Glucose-Capillary: 186 mg/dL — ABNORMAL HIGH (ref 65–99)

## 2015-05-05 SURGERY — A-FLUTTER/A-TACH/SVT ABLATION
Anesthesia: LOCAL

## 2015-05-05 MED ORDER — ACETAMINOPHEN 325 MG PO TABS: 650.0000 mg | ORAL_TABLET | Freq: Four times a day (QID) | ORAL | Status: DC | PRN

## 2015-05-05 MED ORDER — LIDOCAINE HCL (PF) 1 % IJ SOLN: INTRAMUSCULAR | Status: DC | PRN

## 2015-05-05 MED ORDER — FERROUS SULFATE 325 (65 FE) MG PO TABS
325.0000 mg | ORAL_TABLET | Freq: Two times a day (BID) | ORAL | Status: DC
  Administered 2015-05-05: 325 mg via ORAL
  Filled 2015-05-05: qty 1

## 2015-05-05 MED ORDER — ATORVASTATIN CALCIUM 20 MG PO TABS
20.0000 mg | ORAL_TABLET | Freq: Every day | ORAL | Status: DC
  Administered 2015-05-05: 20 mg via ORAL
  Filled 2015-05-05: qty 1

## 2015-05-05 MED ORDER — BUPIVACAINE HCL (PF) 0.25 % IJ SOLN
INTRAMUSCULAR | Status: DC | PRN
  Administered 2015-05-05: 60 mL

## 2015-05-05 MED ORDER — ACETAMINOPHEN 325 MG PO TABS
650.0000 mg | ORAL_TABLET | ORAL | Status: DC | PRN
  Administered 2015-05-05: 650 mg via ORAL
  Filled 2015-05-05: qty 2

## 2015-05-05 MED ORDER — MIDAZOLAM HCL 5 MG/5ML IJ SOLN
INTRAMUSCULAR | Status: AC
  Filled 2015-05-05: qty 5

## 2015-05-05 MED ORDER — MIDAZOLAM HCL 5 MG/5ML IJ SOLN
INTRAMUSCULAR | Status: DC | PRN
  Administered 2015-05-05 (×9): 1 mg via INTRAVENOUS
  Administered 2015-05-05 (×2): 2 mg via INTRAVENOUS

## 2015-05-05 MED ORDER — FENTANYL CITRATE (PF) 100 MCG/2ML IJ SOLN
INTRAMUSCULAR | Status: AC
  Filled 2015-05-05: qty 2

## 2015-05-05 MED ORDER — CANAGLIFLOZIN 100 MG PO TABS
100.0000 mg | ORAL_TABLET | Freq: Every day | ORAL | Status: DC
  Filled 2015-05-05: qty 1

## 2015-05-05 MED ORDER — PANTOPRAZOLE SODIUM 40 MG PO TBEC
40.0000 mg | DELAYED_RELEASE_TABLET | Freq: Every day | ORAL | Status: DC
  Administered 2015-05-05: 40 mg via ORAL
  Filled 2015-05-05: qty 1

## 2015-05-05 MED ORDER — METOPROLOL TARTRATE 25 MG PO TABS
25.0000 mg | ORAL_TABLET | Freq: Two times a day (BID) | ORAL | Status: DC
Start: 2015-05-05 — End: 2015-05-06
  Administered 2015-05-05 – 2015-05-06 (×2): 25 mg via ORAL
  Filled 2015-05-05 (×2): qty 1

## 2015-05-05 MED ORDER — INSULIN GLARGINE 100 UNIT/ML ~~LOC~~ SOLN
18.0000 [IU] | Freq: Every day | SUBCUTANEOUS | Status: DC
  Administered 2015-05-05: 18 [IU] via SUBCUTANEOUS
  Filled 2015-05-05 (×3): qty 0.18

## 2015-05-05 MED ORDER — LISINOPRIL 10 MG PO TABS
10.0000 mg | ORAL_TABLET | Freq: Every day | ORAL | Status: DC
  Administered 2015-05-05 – 2015-05-06 (×2): 10 mg via ORAL
  Filled 2015-05-05 (×2): qty 1

## 2015-05-05 MED ORDER — SODIUM CHLORIDE 0.9% FLUSH
3.0000 mL | Freq: Two times a day (BID) | INTRAVENOUS | Status: DC
  Administered 2015-05-05: 3 mL via INTRAVENOUS

## 2015-05-05 MED ORDER — BUPIVACAINE HCL (PF) 0.25 % IJ SOLN
INTRAMUSCULAR | Status: AC
  Filled 2015-05-05: qty 60

## 2015-05-05 MED ORDER — FUROSEMIDE 40 MG PO TABS
40.0000 mg | ORAL_TABLET | Freq: Every day | ORAL | Status: DC
  Administered 2015-05-05 – 2015-05-06 (×2): 40 mg via ORAL
  Filled 2015-05-05 (×2): qty 1

## 2015-05-05 MED ORDER — ONDANSETRON HCL 4 MG/2ML IJ SOLN: 4.0000 mg | Freq: Four times a day (QID) | INTRAMUSCULAR | Status: DC | PRN

## 2015-05-05 MED ORDER — SODIUM CHLORIDE 0.9% FLUSH: 3.0000 mL | INTRAVENOUS | Status: DC | PRN

## 2015-05-05 MED ORDER — ISOPROTERENOL HCL 0.2 MG/ML IJ SOLN
1.0000 mg | INTRAMUSCULAR | Status: DC | PRN
  Administered 2015-05-05: 2 ug/min via INTRAVENOUS

## 2015-05-05 MED ORDER — INSULIN ASPART 100 UNIT/ML ~~LOC~~ SOLN
0.0000 [IU] | SUBCUTANEOUS | Status: DC
  Administered 2015-05-05: 4 [IU] via SUBCUTANEOUS
  Administered 2015-05-06: 2 [IU] via SUBCUTANEOUS

## 2015-05-05 MED ORDER — SODIUM CHLORIDE 0.9 % IV SOLN: 250.0000 mL | INTRAVENOUS | Status: DC | PRN

## 2015-05-05 MED ORDER — FENTANYL CITRATE (PF) 100 MCG/2ML IJ SOLN
INTRAMUSCULAR | Status: DC | PRN
  Administered 2015-05-05 (×2): 25 ug via INTRAVENOUS
  Administered 2015-05-05: 50 ug via INTRAVENOUS
  Administered 2015-05-05 (×6): 25 ug via INTRAVENOUS

## 2015-05-05 MED ORDER — ISOPROTERENOL HCL 0.2 MG/ML IJ SOLN
INTRAMUSCULAR | Status: AC
  Filled 2015-05-05: qty 5

## 2015-05-05 MED ORDER — GLIPIZIDE 5 MG PO TABS
5.0000 mg | ORAL_TABLET | Freq: Every day | ORAL | Status: DC
  Filled 2015-05-05: qty 1

## 2015-05-05 MED ORDER — METFORMIN HCL 500 MG PO TABS
500.0000 mg | ORAL_TABLET | Freq: Two times a day (BID) | ORAL | Status: DC
  Administered 2015-05-05: 500 mg via ORAL
  Filled 2015-05-05: qty 1

## 2015-05-05 MED ORDER — HEPARIN (PORCINE) IN NACL 2-0.9 UNIT/ML-% IJ SOLN
INTRAMUSCULAR | Status: AC
  Filled 2015-05-05: qty 500

## 2015-05-05 SURGICAL SUPPLY — 12 items
BAG SNAP BAND KOVER 36X36 (MISCELLANEOUS) ×2 IMPLANT
CATH EZ STEER NAV 4MM D-F CUR (ABLATOR) ×1 IMPLANT
CATH JOSEPHSON QUAD-ALLRED 6FR (CATHETERS) ×1 IMPLANT
CATH WEBSTER BI DIR CS D-F CRV (CATHETERS) ×1 IMPLANT
PACK EP LATEX FREE (CUSTOM PROCEDURE TRAY) ×2
PACK EP LF (CUSTOM PROCEDURE TRAY) ×1 IMPLANT
PAD DEFIB LIFELINK (PAD) ×2 IMPLANT
PATCH CARTO3 (PAD) ×1 IMPLANT
SHEATH PINNACLE 6F 10CM (SHEATH) ×3 IMPLANT
SHEATH PINNACLE 7F 10CM (SHEATH) ×2 IMPLANT
SHEATH PINNACLE 8F 10CM (SHEATH) ×2 IMPLANT
SHIELD RADPAD SCOOP 12X17 (MISCELLANEOUS) ×2 IMPLANT

## 2015-05-05 NOTE — H&P (Signed)
Electrophysiology Office Note   Date: 04/15/2015   ID: [name], DOB 12/29/1960, MRN [id number]  PCP: Lance Bosch, NP Cardiologist: Croitoru Primary Electrophysiologist: Zaria Taha Meredith Leeds, MD   Chief Complaint  Patient presents with  . Advice Only    History of Present Illness: [name] is a 55 y.o. female who presents today for electrophysiology evaluation. She has a history of hypertension, OSA and diabetes who presents with SVT. It terminates with valsalva. She was started on metoprolol but continues to have symptoms. When she does have the palpitations, she denies dyspnea, angina, or dizziness. She says that when she is not having episodes she feels well. There is no increasing or alleviating factor other than bearing down. She has required adenosine in the past in the emergency room. It has been difficult to treat her medications as her heart rate and blood pressure have been low in the past.   Today, she denies symptoms of chest pain, shortness of breath, orthopnea, PND, lower extremity edema, claudication, dizziness, presyncope, syncope, bleeding, or neurologic sequela. The patient is tolerating medications without difficulties and is otherwise without complaint today.    Past Medical History  Diagnosis Date  . Abscess   . Sleep difficulties   . Fatigue   . Sinus problem   . Encounter for drainage of abscess     increased drainage from abscess on buttock  . Abscess of buttock   . Hypertension   . Anxiety   . Dysrhythmia     approx 3 years ago, increased heart rate,  . Depression     sees [name]  . Sleep apnea     2008- sleep study, neg. for sleep apnea   . Anal fistula   . Diabetes mellitus without complication [facility])    Past Surgical History  Procedure Laterality Date  . Knee arthroscopy      left  . Shoulder surgery  04/14/09    right   . Incise and drain abcess      abscess on right thigh and buttock  . Anal examination under anesthesia  02/21/11    anal fistula  . Breast surgery  patient does not remember date of procedure    pull fluid off lft br     Current Outpatient Prescriptions  Medication Sig Dispense Refill  . acetaminophen (TYLENOL) 325 MG tablet Take 2 tablets (650 mg total) by mouth every 6 (six) hours as needed for mild pain or moderate pain. 30 tablet 0  . atorvastatin (LIPITOR) 20 MG tablet Take 1 tablet (20 mg total) by mouth at bedtime. 30 tablet 5  . ferrous sulfate 325 (65 FE) MG tablet Take 1 tablet (325 mg total) by mouth 2 (two) times daily with a meal. 60 tablet 2  . furosemide (LASIX) 40 MG tablet Take 40 mg by mouth daily.    Marland Kitchen glipiZIDE (GLUCOTROL) 5 MG tablet TAKE TWO TABLETS BY MOUTH TWICE DAILY BEFORE MEAL(S) 120 tablet 2  . Insulin Glargine (BASAGLAR KWIKPEN) 100 UNIT/ML Solostar Pen Inject 14 Units into the skin daily at 10 pm.    . lisinopril (PRINIVIL,ZESTRIL) 10 MG tablet Take 1 tablet (10 mg total) by mouth daily. 30 tablet 6  . metFORMIN (GLUCOPHAGE) 500 MG tablet Take 1 tablet (500 mg total) by mouth 2 (two) times daily with a meal. 90 tablet 3  . metoprolol tartrate (LOPRESSOR) 25 MG tablet Take 1 tablet (25 mg total) by mouth 2 (two) times daily. 60 tablet 3  . omeprazole (  PRILOSEC) 20 MG capsule Take 1 capsule (20 mg total) by mouth daily. 30 capsule 3   No current facility-administered medications for this visit.    Allergies: Metformin and related and Tramadol   Social History: The patient  reports that she has never smoked. She has never used smokeless tobacco. She reports that she does not drink alcohol or use illicit drugs.   Family History: The patient's family history includes Alzheimer's disease in her mother; Diabetes in her father; Hypertension in her mother. There is no history of  Anesthesia problems, Hypotension, Malignant hyperthermia, or Pseudochol deficiency.    ROS: Please see the history of present illness. Otherwise, review of systems is positive for palpitations, snoring, DOE. All other systems are reviewed and negative.    PHYSICAL EXAM: VS: BP 102/64 mmHg  Pulse 68  Ht 5\' 3"  (1.6 m)  Wt 264 lb 9.6 oz (120.022 kg)  BMI 46.88 kg/m2 , BMI Body mass index is 46.88 kg/(m^2). GEN: Well nourished, well developed, in no acute distress  HEENT: normal  Neck: no JVD, carotid bruits, or masses Cardiac: RRR; no murmurs, rubs, or gallops,no edema  Respiratory: clear to auscultation bilaterally, normal work of breathing GI: soft, nontender, nondistended, + BS MS: no deformity or atrophy  Skin: warm and dry Neuro: Strength and sensation are intact Psych: euthymic mood, full affect  EKG: EKG is not ordered today.  Recent Labs: 08/23/2014: ALT 10 11/08/2014: Hemoglobin 11.4*; Platelets 466* 12/07/2014: BUN 28*; Creatinine, Ser 1.41*; Potassium 3.9; Sodium 138; TSH 0.28*    Lipid Panel   Labs (Brief)       Component Value Date/Time   CHOL 175 12/07/2014 1534   TRIG 220.0* 12/07/2014 1534   HDL 30.10* 12/07/2014 1534   CHOLHDL 6 12/07/2014 1534   VLDL 44.0* 12/07/2014 1534   LDLDIRECT 127.0 12/07/2014 1534       Wt Readings from Last 3 Encounters:  04/15/15 264 lb 9.6 oz (120.022 kg)  04/13/15 251 lb (113.853 kg)  04/11/15 264 lb 1.6 oz (119.795 kg)      Other studies Reviewed: Additional studies/ records that were reviewed today include: 09/07/14 TTE  Review of the above records today demonstrates:  - Normal study, minimally increased LVOT velocity.   ASSESSMENT AND PLAN:  1. SVT: Per report she has had a narrow complex tachycardia. Her tachycardia has responded to adenosine in the past which has broken the tachycardia. It is likely that this has an AV nodal component, AVNRT or ORT. I have  discussed with her options of management. She says that it is difficult to treat her with medications as her blood pressure is low and her pulse is low. I discussed with her the option of ablation she is interested. I told her the risks of the procedure which include bleeding, tamponade, heart block, and stroke. She understands these risks and we Yeray Tomas schedule the procedure soon as possible.  Current medicines are reviewed at length with the patient today.  The patient does not have concerns regarding her medicines. The following changes were made today: none  Labs/ tests ordered today include:  No orders of the defined types were placed in this encounter.    Disposition: FU with Laurelle Skiver post ablation  Signed, Georganne Siple Meredith Leeds, MD  04/15/2015 12:10 PM        [name] is a 55 y.o. female with SVT presenting for ablation.  She has been treated in the past with adenosine with resolution of her SVT.  Plan for ablation today.  Discussed the risks and benefits of the procedure.  Risks include bleeding, infection, tamponade, heart block.  Patient understands the risks and has agreed to the procedure.  Willys Salvino Curt Bears, MD 05/05/2015 12:41 PM

## 2015-05-05 NOTE — Progress Notes (Signed)
Patient still on bedrest. Tylenol given, no other needs expressed at this time. Call light within reach.

## 2015-05-05 NOTE — Progress Notes (Signed)
Site area: rt groin Site Prior to Removal:  Level 0 Pressure Applied For: 15 minutes Manual:   yes Patient Status During Pull:  stable Post Pull Site:  Level 0 Post Pull Instructions Given:  yes Post Pull Pulses Present:  Yes-rt dp Dressing Applied:  tegaderm Bedrest begins @  Comments:

## 2015-05-05 NOTE — Progress Notes (Signed)
Site area: left groin Site Prior to Removal:  Level  0 Pressure Applied For:  15 minutes Manual:   yes Patient Status During Pull:  stable Post Pull Site:  Level 0 Post Pull Instructions Given:  yes Post Pull Pulses Present: yes Dressing Applied:  tegaderm Bedrest begins @ 1650 Comments:  IV saline locked

## 2015-05-05 NOTE — Discharge Summary (Signed)
ELECTROPHYSIOLOGY PROCEDURE DISCHARGE SUMMARY    Patient ID: [name],  MRN: [id number], DOB/AGE: August 19, 1960 55 y.o.  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  Hypertension 2.  Anxiety 3.  Diabetes  Allergies  Allergen Reactions  . Tramadol Nausea Only     Procedures This Admission: 1.  Electrophysiology study and radiofrequency catheter ablation on 05/05/15 by Dr Curt Bears.  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  There were no inducible arrhythmias following ablation and no early apparent complications.   Brief HPI: Philana Pedone is a 55 y.o. female with a past medical history as outlined above.  She has had increasing tachypalpitations with documented SVT that has been terminated with adenosine.  They have failed medical therapy with Metoprolol.  Risks, benefits, and alternatives to ablation were reviewed with the patient who wished to proceed.   Hospital Course:  The patient was admitted and underwent EPS/RFCA with details as outlined above. She was monitored on telemetry overnight which demonstrated sinus rhythm.  Groin incision was without complication.  They were examined and considered stable for discharge to home.  Follow up will be arranged in 4 weeks.  Wound care and restrictions were reviewed with the patient prior to discharge.   Physical Exam: Filed Vitals:   05/05/15 1800 05/05/15 1838 05/05/15 2029 05/06/15 0128  BP: 109/45 108/41 103/61 107/61  Pulse: 69 70 71 59  Temp:   98.4 F (36.9 C) 98 F (36.7 C)  TempSrc:   Oral Oral  Resp:   18 18  Height:      Weight:      SpO2:   98% 99%    GEN- The patient is obese appearing, alert and oriented x 3 today.    HEENT: normocephalic, atraumatic; sclera clear, conjunctiva pink; hearing intact; oropharynx clear; neck supple Lungs- Clear to ausculation bilaterally, normal work of breathing.  No wheezes, rales, rhonchi Heart- Regular rate and rhythm GI- soft, non-tender, non-distended, bowel sounds present Extremities- no clubbing, cyanosis, or edema; DP/PT/radial pulses 2+ bilaterally, groin without bruit, +soft hematoma MS- no significant deformity or atrophy Skin- warm and dry, no rash or lesion Psych- euthymic mood, full affect Neuro- strength and sensation are intact   Discharge Vitals: Blood pressure 107/61, pulse 59, temperature 98 F (36.7 C), temperature source Oral, resp. rate 18, height 5\' 3"  (1.6 m), weight 263 lb (119.296 kg), SpO2 99 %.    Labs:   Lab Results  Component Value Date   WBC 13.2* 04/25/2015   HGB 11.2* 04/25/2015   HCT 35.1* 04/25/2015   MCV 79.4 04/25/2015   PLT 412* 04/25/2015   No results for input(s): NA, K, CL, CO2, BUN, CREATININE, CALCIUM, PROT, BILITOT, ALKPHOS, ALT, AST, GLUCOSE in the last 168 hours.  Invalid input(s): LABALBU  Discharge Medications:    Medication List    TAKE these medications        acetaminophen 325 MG tablet  Commonly known as:  TYLENOL  Take 2 tablets (650 mg total) by mouth every 6 (six) hours as needed for mild pain or moderate pain.     atorvastatin 20 MG tablet  Commonly known as:  LIPITOR  Take 1 tablet (20 mg total) by mouth at bedtime.     BASAGLAR KWIKPEN 100 UNIT/ML Solostar Pen  Generic drug:  Insulin Glargine  Inject 18 Units into the skin daily at 10 pm.     empagliflozin 10 MG Tabs tablet  Commonly known as:  JARDIANCE  Take 10 mg by mouth daily.     ferrous sulfate 325 (65 FE) MG tablet  Take 1 tablet (325 mg total) by mouth 2 (two) times daily with a meal.     furosemide 40 MG tablet  Commonly known as:  LASIX  Take 40 mg by mouth daily.     glipiZIDE 5 MG tablet  Commonly known as:  GLUCOTROL    TAKE TWO TABLETS BY MOUTH TWICE DAILY BEFORE MEAL(S)     lisinopril 10 MG tablet  Commonly known as:  PRINIVIL,ZESTRIL  Take 1 tablet (10 mg total) by mouth daily.     metFORMIN 500 MG tablet  Commonly known as:  GLUCOPHAGE  Take 1 tablet (500 mg total) by mouth 2 (two) times daily with a meal.     metoprolol tartrate 25 MG tablet  Commonly known as:  LOPRESSOR  Take 1 tablet (25 mg total) by mouth 2 (two) times daily.     omeprazole 20 MG capsule  Commonly known as:  PRILOSEC  Take 1 capsule (20 mg total) by mouth daily.        Disposition:  Discharge Instructions    Diet - low sodium heart healthy    Complete by:  As directed      Discharge instructions    Complete by:  As directed   No driving for 3 days. No lifting over 5 lbs for 1 week. No sexual activity for 1 week. You may return to work in 1 week. Keep procedure site clean & dry. If you notice increased pain, swelling, bleeding or pus, call/return!  You may shower, but no soaking baths/hot tubs/pools for 1 week.     Increase activity slowly    Complete by:  As directed           Follow-up Information    Follow up with Will Meredith Leeds, MD On 06/07/2015.   Specialty:  Cardiology   Why:  at 10:45AM    Contact information:   [place] [address] [phone]       Duration of Discharge Encounter: Greater than 30 minutes including physician time.  Signed, Chanetta Marshall, NP 05/06/2015 6:33 AM  I have seen and examined this patient with Chanetta Marshall.  Agree with above, note added to reflect my findings.  On exam, regular rhythm, no murmurs, lungs clear.  Had ablation yesterday for SVT, found to be AVNRT.  No further recurrence overnight.  Will discharge today with follow up in clinic.    Will M. Camnitz MD 05/06/2015 8:34 AM

## 2015-05-06 ENCOUNTER — Encounter (HOSPITAL_COMMUNITY): Payer: Self-pay | Admitting: Cardiology

## 2015-05-06 DIAGNOSIS — E119 Type 2 diabetes mellitus without complications: Secondary | ICD-10-CM | POA: Diagnosis not present

## 2015-05-06 DIAGNOSIS — G4733 Obstructive sleep apnea (adult) (pediatric): Secondary | ICD-10-CM | POA: Diagnosis not present

## 2015-05-06 DIAGNOSIS — I471 Supraventricular tachycardia: Secondary | ICD-10-CM | POA: Diagnosis not present

## 2015-05-06 DIAGNOSIS — I10 Essential (primary) hypertension: Secondary | ICD-10-CM | POA: Diagnosis not present

## 2015-05-06 LAB — GLUCOSE, CAPILLARY
GLUCOSE-CAPILLARY: 77 mg/dL (ref 65–99)
Glucose-Capillary: 63 mg/dL — ABNORMAL LOW (ref 65–99)
Glucose-Capillary: 131 mg/dL — ABNORMAL HIGH (ref 65–99)

## 2015-05-06 MED FILL — Heparin Sodium (Porcine) 2 Unit/ML in Sodium Chloride 0.9%: INTRAMUSCULAR | Qty: 500 | Status: AC

## 2015-05-06 NOTE — Progress Notes (Signed)
Discharged instructions given to patient, voiced understanding..   IV site discontinued.  Telemetry D/C  Mervyn Skeeters, RN

## 2015-05-06 NOTE — Progress Notes (Signed)
Patient's blood sugar was 63, gave her peanut butter, graham crackers and fruit cups. Patient was asymptomatic

## 2015-05-10 ENCOUNTER — Other Ambulatory Visit: Payer: Self-pay | Admitting: Internal Medicine

## 2015-05-11 ENCOUNTER — Encounter: Payer: Self-pay | Admitting: Internal Medicine

## 2015-05-12 ENCOUNTER — Encounter: Payer: Self-pay | Admitting: Physical Medicine & Rehabilitation

## 2015-05-12 ENCOUNTER — Other Ambulatory Visit: Payer: Self-pay | Admitting: Internal Medicine

## 2015-05-12 ENCOUNTER — Encounter
Payer: BC Managed Care – PPO | Attending: Physical Medicine & Rehabilitation | Admitting: Physical Medicine & Rehabilitation

## 2015-05-12 VITALS — BP 121/64 | HR 64 | Resp 14

## 2015-05-12 DIAGNOSIS — R269 Unspecified abnormalities of gait and mobility: Secondary | ICD-10-CM | POA: Insufficient documentation

## 2015-05-12 DIAGNOSIS — I10 Essential (primary) hypertension: Secondary | ICD-10-CM | POA: Diagnosis present

## 2015-05-12 DIAGNOSIS — M461 Sacroiliitis, not elsewhere classified: Secondary | ICD-10-CM | POA: Insufficient documentation

## 2015-05-12 DIAGNOSIS — F329 Major depressive disorder, single episode, unspecified: Secondary | ICD-10-CM | POA: Insufficient documentation

## 2015-05-12 DIAGNOSIS — Z9181 History of falling: Secondary | ICD-10-CM | POA: Insufficient documentation

## 2015-05-12 DIAGNOSIS — M25551 Pain in right hip: Secondary | ICD-10-CM

## 2015-05-12 DIAGNOSIS — Z794 Long term (current) use of insulin: Secondary | ICD-10-CM | POA: Diagnosis not present

## 2015-05-12 DIAGNOSIS — B3731 Acute candidiasis of vulva and vagina: Secondary | ICD-10-CM | POA: Insufficient documentation

## 2015-05-12 DIAGNOSIS — G894 Chronic pain syndrome: Secondary | ICD-10-CM

## 2015-05-12 DIAGNOSIS — I471 Supraventricular tachycardia: Secondary | ICD-10-CM | POA: Insufficient documentation

## 2015-05-12 DIAGNOSIS — Z79899 Other long term (current) drug therapy: Secondary | ICD-10-CM | POA: Diagnosis not present

## 2015-05-12 DIAGNOSIS — E119 Type 2 diabetes mellitus without complications: Secondary | ICD-10-CM | POA: Diagnosis not present

## 2015-05-12 DIAGNOSIS — M7060 Trochanteric bursitis, unspecified hip: Secondary | ICD-10-CM | POA: Diagnosis not present

## 2015-05-12 DIAGNOSIS — M25552 Pain in left hip: Secondary | ICD-10-CM

## 2015-05-12 DIAGNOSIS — B373 Candidiasis of vulva and vagina: Secondary | ICD-10-CM

## 2015-05-12 DIAGNOSIS — G4730 Sleep apnea, unspecified: Secondary | ICD-10-CM | POA: Diagnosis not present

## 2015-05-12 DIAGNOSIS — Z5181 Encounter for therapeutic drug level monitoring: Secondary | ICD-10-CM | POA: Diagnosis not present

## 2015-05-12 MED ORDER — FLUCONAZOLE 150 MG PO TABS: 150.0000 mg | ORAL_TABLET | Freq: Once | ORAL | Status: DC

## 2015-05-12 MED ORDER — TRAMADOL HCL 50 MG PO TABS: 50.0000 mg | ORAL_TABLET | Freq: Four times a day (QID) | ORAL | Status: DC | PRN

## 2015-05-12 MED ORDER — DULOXETINE HCL 30 MG PO CPEP: 60.0000 mg | ORAL_CAPSULE | Freq: Every day | ORAL | Status: DC

## 2015-05-12 NOTE — Progress Notes (Signed)
Subjective:    Patient ID: [name], female    DOB: [birth year], 55 y.o.   MRN: [id number]  HPI  55 y/o female with pmh of SVT, DM, HTN, anxiety/depression and psh of left knee scope and right shoulder scope presents in b/l hip pain, L>R.  This started ~1 year ago.  She denies inciting events. Rest improves the pain. Standing and walking exacerbates the pain to the point where she loses her balance at times.  Describes it at dull pain.  It can radiate to her lower back.  It is constant.  7/10 intensity today.  She denies associated numbness, tingling weakness.  She had hip injections with minimal relief.  She is on Norco with some relief.  MRI back 11/2014 relatively unremarkable.  She states she was told she had severe OA, but no hip injections on file.    Pain Inventory Average Pain 9 Pain Right Now 7 My pain is sharp and dull  In the last 24 hours, has pain interfered with the following? General activity 8 Relation with others 10 Enjoyment of life 10 What TIME of day is your pain at its worst? daytime Sleep (in general) Poor  Pain is worse with: walking, bending and standing Pain improves with: medication, injections and other Relief from Meds: 4  Mobility do you drive?  yes  Function employed # of hrs/week 37.5 I need assistance with the following:  household duties Do you have any goals in this area?  yes  Neuro/Psych trouble walking  Prior Studies x-rays CT/MRI  Physicians involved in your care Primary care . Orthopedist .   Family History  Problem Relation Age of Onset  . Hypertension Mother   . Alzheimer's disease Mother   . Diabetes Father   . Anesthesia problems Neg Hx   . Hypotension Neg Hx   . Malignant hyperthermia Neg Hx   . Pseudochol deficiency Neg Hx   . Cancer Sister    Social History   Social History  . Marital Status: Divorced    Spouse Name: N/A  . Number of Children: N/A  . Years of Education: N/A   Social History Main  Topics  . Smoking status: Never Smoker   . Smokeless tobacco: Never Used  . Alcohol Use: No  . Drug Use: No  . Sexual Activity: Yes   Other Topics Concern  . None   Social History Narrative   Past Surgical History  Procedure Laterality Date  . Knee arthroscopy      left  . Shoulder surgery  04/14/09    right  . Incise and drain abcess      abscess on right thigh and buttock  . Anal examination under anesthesia  02/21/11    anal fistula  . Breast surgery  patient does not remember date of procedure    pull fluid off lft br  . Electrophysiologic study N/A 05/05/2015    Procedure: SVT Ablation;  Surgeon: Will Meredith Leeds, MD;  Location: [facility];  Service: Cardiovascular;  Laterality: N/A;   Past Medical History  Diagnosis Date  . Abscess     increased drainage from abscess on buttock  . Hypertension   . Anxiety   . Depression     sees [name]  . Sleep apnea     2008- sleep study, neg. for sleep apnea   . Anal fistula   . Diabetes mellitus without complication ([place])   . SVT (supraventricular tachycardia) ([place])  BP 121/64 mmHg  Pulse 64  Resp 14  SpO2 97%  Opioid Risk Score:   Fall Risk Score:  `1  Depression screen PHQ 2/9  Depression screen [facility] 2/9 05/12/2015 09/28/2014 08/23/2014 06/23/2014 06/03/2014  Decreased Interest 2 1 0 0 0  Down, Depressed, Hopeless 1 0 0 0 0  PHQ - 2 Score 3 1 0 0 0  Altered sleeping 3 - - - -  Tired, decreased energy 1 - - - -  Change in appetite 1 - - - -  Feeling bad or failure about yourself  0 - - - -  Trouble concentrating 0 - - - -  Moving slowly or fidgety/restless 0 - - - -  Suicidal thoughts 0 - - - -  PHQ-9 Score 8 - - - -  Difficult doing work/chores Somewhat difficult - - - -   Current Outpatient Prescriptions on File Prior to Visit  Medication Sig Dispense Refill  . acetaminophen (TYLENOL) 325 MG tablet Take 2 tablets (650 mg total) by mouth every 6 (six) hours as needed for mild pain or moderate pain.  30 tablet 0  . atorvastatin (LIPITOR) 20 MG tablet Take 1 tablet (20 mg total) by mouth at bedtime. 30 tablet 5  . empagliflozin (JARDIANCE) 10 MG TABS tablet Take 10 mg by mouth daily. 30 tablet 2  . ferrous sulfate 325 (65 FE) MG tablet Take 1 tablet (325 mg total) by mouth 2 (two) times daily with a meal. 60 tablet 2  . FLUoxetine (PROZAC) 40 MG capsule TAKE ONE CAPSULE BY MOUTH ONCE DAILY 30 capsule 0  . furosemide (LASIX) 40 MG tablet Take 40 mg by mouth daily.    Marland Kitchen glipiZIDE (GLUCOTROL) 5 MG tablet TAKE TWO TABLETS BY MOUTH TWICE DAILY BEFORE MEAL(S) 180 tablet 1  . Insulin Glargine (BASAGLAR KWIKPEN) 100 UNIT/ML Solostar Pen Inject 18 Units into the skin daily at 10 pm.     . lisinopril (PRINIVIL,ZESTRIL) 10 MG tablet Take 1 tablet (10 mg total) by mouth daily. 30 tablet 6  . metFORMIN (GLUCOPHAGE) 500 MG tablet Take 1 tablet (500 mg total) by mouth 2 (two) times daily with a meal. 180 tablet 1  . metoprolol tartrate (LOPRESSOR) 25 MG tablet Take 1 tablet (25 mg total) by mouth 2 (two) times daily. 60 tablet 3  . omeprazole (PRILOSEC) 20 MG capsule Take 1 capsule (20 mg total) by mouth daily. 30 capsule 3   No current facility-administered medications on file prior to visit.     Review of Systems  Constitutional: Positive for diaphoresis. Negative for unexpected weight change.  Gastrointestinal:       Denies incontinence  Endocrine:       High blood sugar  Genitourinary:       Denies incontinence  Musculoskeletal: Positive for back pain and gait problem.       Hip pain  Neurological: Negative for weakness and numbness.  All other systems reviewed and are negative.      Objective:   Physical Exam HENT: Normocephalic, Atraumatic Eyes: EOMI, Conj WNL Cardio: S1, S2 normal, RRR Pulm: B/l clear to auscultation.  Effort normal Abd: +obese. Soft, non-distended, non-tender, BS+ MSK:  Gait Antalgic.   TTP in lower back around hips with ?increased in tenderness along b/l SI  joints and greater trochanters.    No edema.   + FABERs/ FADERs for b/l hip pain with minimal movement.  Neuro: CN II-XII grossly intact.    Sensation intact to light touch  in all LE dermatomes  Reflexes 1+ in b/l LE  Strength  5/5 in all LE myotomes  Neg SLR b/l Skin: Warm and Dry    Assessment & Plan:  55 y/o female with pmh of SVT, DM, HTN, anxiety/depression and psh of left knee scope and right shoulder scope presents in b/l hip pain, L>R.   1. Chronic B/l Hip pain L>R  L-Spine MRI 12/06/14 relatively unremarkable  Pt has had steroid injection of hips without significant benefit and worsening after injections  Due to uncontrolled DM (recent A1c 9.9 on 04/15/15) and steroid injections, will order MRI to rule out Avascular necrosis  Cont PRN Tylenol limit to 1000mg /day  Pt states she took tramadol a long time ago and it made her nauseated, encouraged pt to take with food. Tramadol 50mg  PRN. (educated pt on sign/symptoms of Serotonin syndrome)  Will prescribe Cymbalta 60mg , take 30mg  1 week and then increase 60mg  (educated pt on sign/symptoms of Serotonin syndrome)  Will await PT referral until MRI   2. Abnormality of gait with falls  Will order cane for safety given pt's history of falls  3. Morbid obesity  Contributing to pain  Will await PT referral until MRI  4. ?Trochanteric bursitis  Difficult to discern due to diffuse pain  Will consider injections in future  5. ?B/l Sacroiliitis   Difficult to discern due to diffuse pain   Will consider injections in future

## 2015-05-18 ENCOUNTER — Telehealth: Payer: Self-pay

## 2015-05-18 LAB — TOXASSURE SELECT,+ANTIDEPR,UR: PDF: 0

## 2015-05-18 NOTE — Telephone Encounter (Signed)
Pt was in clinic a few days ago. She states that the Tramadol is still making her feel Nauseous. Would like to know what to do next.

## 2015-05-18 NOTE — Addendum Note (Signed)
Addended by: Gara Kroner L on: 05/18/2015 01:36 PM   Modules accepted: Orders

## 2015-05-19 NOTE — Telephone Encounter (Signed)
She may try taking a 1/2 tab (25mg ).

## 2015-05-19 NOTE — Telephone Encounter (Signed)
Spoke with patient and she is still nauseated on the 1/2 tab along with diarrhea.  Please advise

## 2015-05-23 NOTE — Telephone Encounter (Signed)
Left message to inform pt to d/c Tramadol.

## 2015-05-23 NOTE — Telephone Encounter (Signed)
Please advise on Tramadol.

## 2015-05-23 NOTE — Progress Notes (Signed)
Urine drug screen for this encounter is consistent for prescribed medication. Presence of metabolites of diazepam present which has ben a prescribed medication

## 2015-05-23 NOTE — Telephone Encounter (Signed)
She may d/c tramadol

## 2015-05-27 ENCOUNTER — Encounter (HOSPITAL_BASED_OUTPATIENT_CLINIC_OR_DEPARTMENT_OTHER): Payer: BC Managed Care – PPO | Admitting: Physical Medicine & Rehabilitation

## 2015-05-27 ENCOUNTER — Encounter: Payer: Self-pay | Admitting: Physical Medicine & Rehabilitation

## 2015-05-27 VITALS — BP 117/70 | HR 80 | Resp 14

## 2015-05-27 DIAGNOSIS — R269 Unspecified abnormalities of gait and mobility: Secondary | ICD-10-CM | POA: Insufficient documentation

## 2015-05-27 DIAGNOSIS — M25551 Pain in right hip: Secondary | ICD-10-CM

## 2015-05-27 DIAGNOSIS — M25552 Pain in left hip: Secondary | ICD-10-CM

## 2015-05-27 DIAGNOSIS — G894 Chronic pain syndrome: Secondary | ICD-10-CM | POA: Diagnosis not present

## 2015-05-27 DIAGNOSIS — M160 Bilateral primary osteoarthritis of hip: Secondary | ICD-10-CM | POA: Insufficient documentation

## 2015-05-27 HISTORY — DX: Chronic pain syndrome: G89.4

## 2015-05-27 HISTORY — DX: Pain in left hip: M25.551

## 2015-05-27 MED ORDER — OXYCODONE HCL 5 MG PO TABS: 5.0000 mg | ORAL_TABLET | Freq: Two times a day (BID) | ORAL | Status: DC | PRN

## 2015-05-27 MED ORDER — DULOXETINE HCL 60 MG PO CPEP: 60.0000 mg | ORAL_CAPSULE | Freq: Every day | ORAL | Status: DC

## 2015-05-27 NOTE — Progress Notes (Signed)
Subjective:    Patient ID: [name], female    DOB: 1960-05-21, 55 y.o.   MRN: [id number]  HPI  55 y/o female with pmh of SVT, DM, HTN, anxiety/depression and psh of left knee scope and right shoulder scope presents for follow up of b/l hip pain, L>R. This started ~early 2016. She denies inciting events. Rest improves the pain. Standing and walking exacerbates the pain to the point where she loses her balance at times. Describes it at dull pain. It can radiate to her lower back. It is constant. 7/10 intensity today. She denies associated numbness, tingling weakness. She had hip injections with minimal relief. She is on Norco with some relief.  MRI back 11/2014 relatively unremarkable. She states she was told she had severe OA, but no hip injections on file.   Pt was ordered tramadol on last visit, but she had nausea and diarrhea with it.  An MRI was also ordered, which is scheduled for next week.  She cont to take tylenol, more than recommended.  She is take Cymbalta with significant benefit.    Pain Inventory Average Pain 7 Pain Right Now 7 My pain is sharp and stabbing  In the last 24 hours, has pain interfered with the following? General activity 9 Relation with others 7 Enjoyment of life 10 What TIME of day is your pain at its worst? morning, daytime Sleep (in general) Poor  Pain is worse with: walking, bending and standing Pain improves with: medication Relief from Meds: 5  Mobility walk without assistance how many minutes can you walk? 6 ability to climb steps?  yes do you drive?  yes Do you have any goals in this area?  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  no  Physicians involved in your care Any changes since last visit?  no   Family History  Problem Relation Age of Onset  . Hypertension Mother   . Alzheimer's disease  Mother   . Diabetes Father   . Anesthesia problems Neg Hx   . Hypotension Neg Hx   . Malignant hyperthermia Neg Hx   . Pseudochol deficiency Neg Hx   . Cancer Sister    Social History   Social History  . Marital Status: Divorced    Spouse Name: N/A  . Number of Children: N/A  . Years of Education: N/A   Social History Main Topics  . Smoking status: Never Smoker   . Smokeless tobacco: Never Used  . Alcohol Use: No  . Drug Use: No  . Sexual Activity: Yes   Other Topics Concern  . None   Social History Narrative   Past Surgical History  Procedure Laterality Date  . Knee arthroscopy      left  . Shoulder surgery  04/14/09    right  . Incise and drain abcess      abscess on right thigh and buttock  . Anal examination under anesthesia  02/21/11    anal fistula  . Breast surgery  patient does not remember date of procedure    pull fluid off lft br  . Electrophysiologic study N/A 05/05/2015    Procedure: SVT Ablation;  Surgeon: Will Meredith Leeds, MD;  Location: [facility];  Service: Cardiovascular;  Laterality: N/A;   Past Medical History  Diagnosis Date  . Abscess     increased drainage from abscess on buttock  .  Hypertension   . Anxiety   . Depression     sees [name]  . Sleep apnea     2008- sleep study, neg. for sleep apnea   . Anal fistula   . Diabetes mellitus without complication ([place])   . SVT (supraventricular tachycardia) ([facility])    BP 117/70 mmHg  Pulse 80  Resp 14  SpO2 96%  Opioid Risk Score:   Fall Risk Score:  `1  Depression screen PHQ 2/9  Depression screen [facility] 2/9 05/12/2015 09/28/2014 08/23/2014 06/23/2014 06/03/2014  Decreased Interest 2 1 0 0 0  Down, Depressed, Hopeless 1 0 0 0 0  PHQ - 2 Score 3 1 0 0 0  Altered sleeping 3 - - - -  Tired, decreased energy 1 - - - -  Change in appetite 1 - - - -  Feeling bad or failure about yourself  0 - - - -  Trouble concentrating 0 - - - -  Moving slowly or fidgety/restless 0 - - - -    Suicidal thoughts 0 - - - -  PHQ-9 Score 8 - - - -  Difficult doing work/chores Somewhat difficult - - - -   Review of Systems  Musculoskeletal: Positive for back pain and arthralgias.  All other systems reviewed and are negative.     Objective:   Physical Exam HENT: Normocephalic, Atraumatic Eyes: EOMI, Conj WNL Cardio: S1, S2 normal, RRR Pulm: B/l clear to auscultation. Effort normal Abd: +obese. Soft, non-distended, non-tender, BS+ MSK: Gait Antalgic.  TTP in lower back around hips with ?increased in tenderness along b/l SI joints and greater trochanters.  No edema.  + FABERs/ FADERs for b/l hip pain with minimal movement.  Neuro: CN II-XII grossly intact.  Sensation intact to light touch in all LE dermatomes Reflexes 1+ in b/l LE Strength 5/5 in all LE myotomes Neg SLR b/l Skin: Warm and Dry    Assessment & Plan:  55 y/o female with pmh of SVT, DM, HTN, anxiety/depression and psh of left knee scope and right shoulder scope presents in b/l hip pain, L>R.   1. Chronic B/l Hip pain L>R L-Spine MRI 12/06/14 relatively unremarkable Pt has had steroid injection of hips without significant benefit and worsening after injections Due to uncontrolled DM (recent A1c 9.9 on 04/15/15) and steroid injections, ordered MRI to rule out Avascular necrosis Tramadol causes nausea/diarrhea  Cont PRN Tylenol limit to 1000mg /day Cont Cymbalta 60mg   Will order oxycodone 5mg  q12 PRN until MRI Will await PT referral until MRI  2. Abnormality of gait with falls  Cont cane for safety given pt's history of falls  3. Morbid obesity Contributing to pain Will await PT referral until MRI  4. ?Trochanteric bursitis Difficult to discern due to diffuse pain Will  consider injections in future  5. ?B/l Sacroiliitis  Difficult to discern due to diffuse pain Will consider injections in future

## 2015-05-31 ENCOUNTER — Other Ambulatory Visit: Payer: Self-pay | Admitting: Internal Medicine

## 2015-05-31 ENCOUNTER — Other Ambulatory Visit: Payer: Self-pay | Admitting: Cardiovascular Disease

## 2015-06-01 ENCOUNTER — Ambulatory Visit: Payer: BC Managed Care – PPO | Admitting: Physician Assistant

## 2015-06-01 NOTE — Telephone Encounter (Signed)
REFILL 

## 2015-06-03 ENCOUNTER — Ambulatory Visit (HOSPITAL_COMMUNITY): Admission: RE | Admit: 2015-06-03 | Payer: BC Managed Care – PPO | Source: Ambulatory Visit

## 2015-06-06 ENCOUNTER — Encounter: Payer: Self-pay | Admitting: Internal Medicine

## 2015-06-06 ENCOUNTER — Ambulatory Visit: Payer: BC Managed Care – PPO | Attending: Internal Medicine | Admitting: Internal Medicine

## 2015-06-06 VITALS — BP 117/76 | HR 86 | Temp 99.8°F | Resp 16 | Ht 63.0 in | Wt 256.8 lb

## 2015-06-06 DIAGNOSIS — F329 Major depressive disorder, single episode, unspecified: Secondary | ICD-10-CM | POA: Diagnosis not present

## 2015-06-06 DIAGNOSIS — F419 Anxiety disorder, unspecified: Secondary | ICD-10-CM | POA: Insufficient documentation

## 2015-06-06 DIAGNOSIS — I471 Supraventricular tachycardia: Secondary | ICD-10-CM | POA: Insufficient documentation

## 2015-06-06 DIAGNOSIS — R05 Cough: Secondary | ICD-10-CM | POA: Diagnosis present

## 2015-06-06 DIAGNOSIS — R6889 Other general symptoms and signs: Secondary | ICD-10-CM | POA: Diagnosis not present

## 2015-06-06 DIAGNOSIS — G4730 Sleep apnea, unspecified: Secondary | ICD-10-CM | POA: Diagnosis not present

## 2015-06-06 DIAGNOSIS — E119 Type 2 diabetes mellitus without complications: Secondary | ICD-10-CM | POA: Diagnosis not present

## 2015-06-06 DIAGNOSIS — R51 Headache: Secondary | ICD-10-CM | POA: Diagnosis present

## 2015-06-06 DIAGNOSIS — Z794 Long term (current) use of insulin: Secondary | ICD-10-CM | POA: Diagnosis not present

## 2015-06-06 DIAGNOSIS — Z79899 Other long term (current) drug therapy: Secondary | ICD-10-CM | POA: Insufficient documentation

## 2015-06-06 DIAGNOSIS — R5383 Other fatigue: Secondary | ICD-10-CM | POA: Diagnosis present

## 2015-06-06 DIAGNOSIS — Z888 Allergy status to other drugs, medicaments and biological substances status: Secondary | ICD-10-CM | POA: Diagnosis not present

## 2015-06-06 DIAGNOSIS — G894 Chronic pain syndrome: Secondary | ICD-10-CM | POA: Insufficient documentation

## 2015-06-06 DIAGNOSIS — R509 Fever, unspecified: Secondary | ICD-10-CM | POA: Diagnosis not present

## 2015-06-06 DIAGNOSIS — I10 Essential (primary) hypertension: Secondary | ICD-10-CM | POA: Insufficient documentation

## 2015-06-06 DIAGNOSIS — E042 Nontoxic multinodular goiter: Secondary | ICD-10-CM | POA: Insufficient documentation

## 2015-06-06 MED ORDER — GUAIFENESIN-CODEINE 100-10 MG/5ML PO SYRP: 5.0000 mL | ORAL_SOLUTION | Freq: Three times a day (TID) | ORAL | Status: DC | PRN

## 2015-06-06 MED ORDER — BENZONATATE 200 MG PO CAPS
200.0000 mg | ORAL_CAPSULE | Freq: Two times a day (BID) | ORAL | Status: DC | PRN
Start: 2015-06-06 — End: 2015-08-23

## 2015-06-06 NOTE — Progress Notes (Signed)
Patient's here for fever, bodyaches, cough.  Patient states she having constant pain, feels fatigue, feels like she's been ran over. Rated pain 7/10.  Patient denies any vomiting.  Patient has had flu shots.

## 2015-06-06 NOTE — Progress Notes (Signed)
Patient ID: [name], female   DOB: 10-10-1960, 55 y.o.   MRN: [id number]  CC: cough, fever, fatigue, headache and body aches  HPI: [name] is a 55 y.o. female here today for cough, fever, fatigue, headache and body aches.  Patient has past medical history of DM II and thyroid nodules.  Patient presents with low-fever and cough.  Patient states symptoms began Friday night and has progressively gotten worse.  Patient has productive cough that is worse at night.  Patient states she has been taking NyQuil with minimal effectiveness.  Slight SOB and nausea with coughing. Patient has No headache, No chest pain, No abdominal pain - No Nausea, No new weakness tingling or numbness  Allergies  Allergen Reactions  . Tramadol Nausea Only   Past Medical History  Diagnosis Date  . Abscess     increased drainage from abscess on buttock  . Hypertension   . Anxiety   . Depression     sees [name]  . Sleep apnea     2008- sleep study, neg. for sleep apnea   . Anal fistula   . Diabetes mellitus without complication ([place])   . SVT (supraventricular tachycardia) ([place])   . Bilateral hip pain 05/27/2015  . Chronic pain syndrome 05/27/2015   Current Outpatient Prescriptions on File Prior to Visit  Medication Sig Dispense Refill  . acetaminophen (TYLENOL) 325 MG tablet Take 2 tablets (650 mg total) by mouth every 6 (six) hours as needed for mild pain or moderate pain. 30 tablet 0  . atorvastatin (LIPITOR) 20 MG tablet TAKE ONE TABLET BY MOUTH AT BEDTIME 30 tablet 2  . DULoxetine (CYMBALTA) 60 MG capsule Take 1 capsule (60 mg total) by mouth daily. 30 capsule 1  . empagliflozin (JARDIANCE) 10 MG TABS tablet Take 10 mg by mouth daily. 30 tablet 2  . ferrous sulfate 325 (65 FE) MG tablet Take 1 tablet (325 mg total) by mouth 2 (two) times daily with a meal. 60 tablet 2  . fluconazole (DIFLUCAN) 150 MG tablet Take 1 tablet (150 mg total) by mouth once. 1 tablet 1  . FLUoxetine (PROZAC) 40  MG capsule TAKE ONE CAPSULE BY MOUTH ONCE DAILY 30 capsule 0  . furosemide (LASIX) 40 MG tablet TAKE ONE TABLET BY MOUTH ONCE DAILY 5 DAYS A WEEK OR AS DIRECTED BY DOCTOR 90 tablet 0  . glipiZIDE (GLUCOTROL) 5 MG tablet TAKE TWO TABLETS BY MOUTH TWICE DAILY BEFORE MEAL(S) 180 tablet 1  . Insulin Glargine (BASAGLAR KWIKPEN) 100 UNIT/ML Solostar Pen Inject 18 Units into the skin daily at 10 pm.     . lisinopril (PRINIVIL,ZESTRIL) 10 MG tablet Take 1 tablet (10 mg total) by mouth daily. 30 tablet 6  . metFORMIN (GLUCOPHAGE) 500 MG tablet Take 1 tablet (500 mg total) by mouth 2 (two) times daily with a meal. 180 tablet 1  . metoprolol tartrate (LOPRESSOR) 25 MG tablet Take 1 tablet (25 mg total) by mouth 2 (two) times daily. 60 tablet 3  . omeprazole (PRILOSEC) 20 MG capsule Take 1 capsule (20 mg total) by mouth daily. 30 capsule 3  . oxyCODONE (ROXICODONE) 5 MG immediate release tablet Take 1 tablet (5 mg total) by mouth every 12 (twelve) hours as needed for severe pain. 90 tablet 0   No current facility-administered medications on file prior to visit.   Family History  Problem Relation Age of Onset  . Hypertension Mother   . Alzheimer's disease Mother   . Diabetes Father   .  Anesthesia problems Neg Hx   . Hypotension Neg Hx   . Malignant hyperthermia Neg Hx   . Pseudochol deficiency Neg Hx   . Cancer Sister    Social History   Social History  . Marital Status: Divorced    Spouse Name: N/A  . Number of Children: N/A  . Years of Education: N/A   Occupational History  . Not on file.   Social History Main Topics  . Smoking status: Never Smoker   . Smokeless tobacco: Never Used  . Alcohol Use: No  . Drug Use: No  . Sexual Activity: Yes   Other Topics Concern  . Not on file   Social History Narrative    Review of Systems: Constitutional: +diaphoresis, +fever, +chills, +activity change, +appetite change and +fatigue. HENT: +headache Negative for ear pain, nosebleeds,  congestion, facial swelling, rhinorrhea, neck pain, neck stiffness and ear discharge.  Eyes: Negative for pain, discharge, redness, itching and visual disturbance. Respiratory: +cough, +shortness of breath Negative for choking, chest tightness, wheezing and stridor.  Cardiovascular: Negative for chest pain, palpitations and leg swelling. Musculoskeletal: +body aches Negative for back pain, joint swelling, arthralgias and gait problem.   Objective:   Filed Vitals:   06/06/15 1420  BP: 117/76  Pulse: 86  Temp: 99.8 F (37.7 C)  Resp: 16   Physical Exam  Constitutional: She is oriented to person, place, and time. She appears well-developed and well-nourished.  diaphoretic  HENT:  Head: Normocephalic and atraumatic.  Right Ear: External ear normal.  Left Ear: External ear normal.  Nose: Nose normal.  Mouth/Throat: Oropharynx is clear and moist.  Eyes: Pupils are equal, round, and reactive to light.  Neck: No JVD present. No tracheal deviation present.  Cardiovascular: Normal rate, regular rhythm and normal heart sounds.   No murmur heard. Pulmonary/Chest: Effort normal and breath sounds normal. No respiratory distress. She has no wheezes. She has no rales. She exhibits no tenderness.  Abdominal: She exhibits no distension.  Musculoskeletal: She exhibits no edema.  Lymphadenopathy:    She has no cervical adenopathy.  Neurological: She is alert and oriented to person, place, and time.     Lab Results  Component Value Date   WBC 13.2* 04/25/2015   HGB 11.2* 04/25/2015   HCT 35.1* 04/25/2015   MCV 79.4 04/25/2015   PLT 412* 04/25/2015   Lab Results  Component Value Date   CREATININE 1.57* 04/25/2015   BUN 27* 04/25/2015   NA 139 04/25/2015   K 3.9 04/25/2015   CL 104 04/25/2015   CO2 22 04/25/2015    Lab Results  Component Value Date   HGBA1C 9.9* 04/15/2015   Lipid Panel     Component Value Date/Time   CHOL 175 12/07/2014 1534   TRIG 220.0* 12/07/2014 1534    HDL 30.10* 12/07/2014 1534   CHOLHDL 6 12/07/2014 1534   VLDL 44.0* 12/07/2014 1534       Assessment and plan:   [name] was seen today for fever, generalized body aches and cough.  Diagnoses and all orders for this visit:  Flu-like symptoms -     benzonatate (TESSALON) 200 MG capsule; Take 1 capsule (200 mg total) by mouth 2 (two) times daily as needed for cough. -     guaiFENesin-codeine (ROBITUSSIN AC) 100-10 MG/5ML syrup; Take 5 mLs by mouth 3 (three) times daily as needed for cough. Patient presents with flu-like symptoms.  Above medications prescribed for cough---one for work and other at night. Patient instructed to  drink plenty of fluids and rest. Work note provided for patient to return to work on Thursday.  Patient to call Thursday if symptoms worsen.  Melissa Noon, BSN, RN, NP [employer] [facility] [phone] 06/06/2015, 2:59 PM

## 2015-06-07 ENCOUNTER — Ambulatory Visit: Payer: BC Managed Care – PPO | Admitting: Cardiology

## 2015-06-07 ENCOUNTER — Ambulatory Visit (HOSPITAL_COMMUNITY): Admission: RE | Admit: 2015-06-07 | Payer: BC Managed Care – PPO | Source: Ambulatory Visit

## 2015-06-08 ENCOUNTER — Telehealth: Payer: Self-pay | Admitting: Internal Medicine

## 2015-06-08 NOTE — Telephone Encounter (Signed)
Patient would like an additional work note, she is not feeling better, still experiencing flu symptoms (fever, chills)....please fax to employer [phone]

## 2015-06-09 ENCOUNTER — Telehealth: Payer: Self-pay

## 2015-06-09 ENCOUNTER — Other Ambulatory Visit: Payer: Self-pay

## 2015-06-09 NOTE — Telephone Encounter (Signed)
Spoke with patient  She will be returning back to work Monday 4/3 Note faxed to [phone]

## 2015-06-10 ENCOUNTER — Ambulatory Visit: Payer: BC Managed Care – PPO | Admitting: Physical Medicine & Rehabilitation

## 2015-06-16 ENCOUNTER — Ambulatory Visit (HOSPITAL_COMMUNITY): Admission: RE | Admit: 2015-06-16 | Payer: BC Managed Care – PPO | Source: Ambulatory Visit

## 2015-06-23 ENCOUNTER — Other Ambulatory Visit: Payer: Self-pay | Admitting: Internal Medicine

## 2015-06-24 ENCOUNTER — Other Ambulatory Visit: Payer: Self-pay

## 2015-06-24 MED ORDER — LISINOPRIL 10 MG PO TABS: 10.0000 mg | ORAL_TABLET | Freq: Every day | ORAL | Status: DC

## 2015-06-24 NOTE — Telephone Encounter (Signed)
Rx(s) sent to pharmacy electronically.  

## 2015-06-27 ENCOUNTER — Ambulatory Visit (HOSPITAL_COMMUNITY): Payer: BC Managed Care – PPO

## 2015-07-07 ENCOUNTER — Encounter: Payer: BC Managed Care – PPO | Admitting: Physical Medicine & Rehabilitation

## 2015-07-14 ENCOUNTER — Ambulatory Visit: Payer: BC Managed Care – PPO | Admitting: Internal Medicine

## 2015-07-22 ENCOUNTER — Telehealth: Payer: Self-pay | Admitting: *Deleted

## 2015-07-22 NOTE — Telephone Encounter (Signed)
Has she had her MRI?  I do no see it in EHR.  She may have a 2 week refill if she has the results of her MRI, but this would only be a 1 time prescription.  Thanks.

## 2015-07-22 NOTE — Telephone Encounter (Signed)
Pt left message stating that she had to cancel her appt because she could not meet the co-pay.   She is asking for a a 2 week bridge script to cover her for 2 weeks until she has the funds for co-pay.  She currently has not set up that appt

## 2015-07-27 ENCOUNTER — Telehealth: Payer: Self-pay | Admitting: Cardiovascular Disease

## 2015-07-27 NOTE — Telephone Encounter (Signed)
New messasge  Pt c/o Shortness Of Breath: STAT if SOB developed within the last 24 hours or pt is noticeably SOB on the phone  1. Are you currently SOB (can you hear that pt is SOB on the phone)? Yes ( Cant hear it over the phone)  2. How long have you been experiencing SOB? 3 days   3. Are you SOB when sitting or when up moving around? Moving around walking   4.  Are you currently experiencing any other symptoms? No

## 2015-07-27 NOTE — Telephone Encounter (Addendum)
Pt called c/o of SOB as she cannot walk 20-30 feet without having to stop for rest, that has been going on for the past 3 days.  Pt has no c/o of swelling. She has increase sweating, decreased weight (down 20 lb since Feb. ) and decrease appetite. Since her ablations she does feel better and palpitations are not as frequent. Pt stated according to her FitBit her HR is showing 58-62 with occasional lightheadedness with the SOB and when she stands up to fast. Pt stated she does get chest pressure at times that last 30-40 minutes he gets  Better with rest, she has not needed to take Nitro.  Told pt that would forward to MD to review and will get back to her with any suggestions. Pt verbalized understanding, no additional questions.

## 2015-07-27 NOTE — Telephone Encounter (Signed)
Left message to call back  

## 2015-07-28 NOTE — Telephone Encounter (Signed)
Left message to call back to review in detail

## 2015-07-28 NOTE — Telephone Encounter (Signed)
I recommend that she wean off her beta blocker. Cut back to 1/2 tab (12.5 mg twice daily) for 3 days , then stop.

## 2015-07-29 NOTE — Telephone Encounter (Signed)
Patient did not get MRI, called and left message that she will need to call and make an appt

## 2015-07-31 ENCOUNTER — Other Ambulatory Visit: Payer: Self-pay | Admitting: Internal Medicine

## 2015-08-01 ENCOUNTER — Other Ambulatory Visit: Payer: Self-pay | Admitting: Internal Medicine

## 2015-08-01 NOTE — Telephone Encounter (Signed)
Entry error

## 2015-08-02 ENCOUNTER — Ambulatory Visit: Payer: BC Managed Care – PPO | Admitting: Cardiology

## 2015-08-23 ENCOUNTER — Telehealth: Payer: Self-pay | Admitting: Medical

## 2015-08-23 ENCOUNTER — Ambulatory Visit (INDEPENDENT_AMBULATORY_CARE_PROVIDER_SITE_OTHER): Payer: BC Managed Care – PPO | Admitting: Medical

## 2015-08-23 ENCOUNTER — Encounter: Payer: Self-pay | Admitting: Medical

## 2015-08-23 VITALS — BP 120/80 | HR 79 | Wt 251.0 lb

## 2015-08-23 DIAGNOSIS — M25552 Pain in left hip: Secondary | ICD-10-CM

## 2015-08-23 DIAGNOSIS — I10 Essential (primary) hypertension: Secondary | ICD-10-CM

## 2015-08-23 DIAGNOSIS — G894 Chronic pain syndrome: Secondary | ICD-10-CM | POA: Diagnosis not present

## 2015-08-23 DIAGNOSIS — E118 Type 2 diabetes mellitus with unspecified complications: Secondary | ICD-10-CM

## 2015-08-23 DIAGNOSIS — M25551 Pain in right hip: Secondary | ICD-10-CM

## 2015-08-23 DIAGNOSIS — N189 Chronic kidney disease, unspecified: Secondary | ICD-10-CM | POA: Diagnosis not present

## 2015-08-23 LAB — CBC
HEMATOCRIT: 37.4 % (ref 35.0–45.0)
Hemoglobin: 11.9 g/dL (ref 11.7–15.5)
MCH: 25.2 pg — AB (ref 27.0–33.0)
MCHC: 31.8 g/dL — ABNORMAL LOW (ref 32.0–36.0)
MCV: 79.2 fL — AB (ref 80.0–100.0)
MPV: 10.3 fL (ref 7.5–12.5)
Platelets: 424 10*3/uL — ABNORMAL HIGH (ref 140–400)
RBC: 4.72 MIL/uL (ref 3.80–5.10)
RDW: 14.9 % (ref 11.0–15.0)
WBC: 12.3 10*3/uL — ABNORMAL HIGH (ref 4.0–10.5)

## 2015-08-23 LAB — COMPREHENSIVE METABOLIC PANEL
ALBUMIN: 3.7 g/dL (ref 3.6–5.1)
ALT: 13 U/L (ref 6–29)
AST: 13 U/L (ref 10–35)
Alkaline Phosphatase: 80 U/L (ref 33–130)
BILIRUBIN TOTAL: 0.4 mg/dL (ref 0.2–1.2)
BUN: 16 mg/dL (ref 7–25)
CALCIUM: 9.7 mg/dL (ref 8.6–10.4)
CHLORIDE: 106 mmol/L (ref 98–110)
CO2: 24 mmol/L (ref 20–31)
CREATININE: 1.22 mg/dL — AB (ref 0.50–1.05)
GLUCOSE: 75 mg/dL (ref 65–99)
Potassium: 4 mmol/L (ref 3.5–5.3)
Sodium: 142 mmol/L (ref 135–146)
Total Protein: 6.3 g/dL (ref 6.1–8.1)

## 2015-08-23 LAB — HEMOGLOBIN A1C
Hgb A1c MFr Bld: 9.1 % — ABNORMAL HIGH (ref <5.7)
Mean Plasma Glucose: 214 mg/dL

## 2015-08-23 MED ORDER — DULOXETINE HCL 60 MG PO CPEP: 60.0000 mg | ORAL_CAPSULE | Freq: Two times a day (BID) | ORAL | Status: DC

## 2015-08-23 MED ORDER — DULOXETINE HCL 60 MG PO CPEP: 60.0000 mg | ORAL_CAPSULE | Freq: Every day | ORAL | Status: DC

## 2015-08-23 NOTE — Telephone Encounter (Signed)
Pt called stating that she picked up the Cymbalta script that was sent in for her today and the directions on the bottle state to take 1 pill daily but pt thinks Audelia Acton told her to take 1 pill twice a day. Directions on med in the system show 1 pill twice a day and & Shane's note show that he changed the med to this as well. Called pharmacy and they confirmed that they recvd directions of 1 pill once a day from electronic script. Looks as if those directions were sent in error at first but then corrected so explained this to pharmacy who corrected it through insurance. Called pt to let her know.

## 2015-08-23 NOTE — Progress Notes (Signed)
Subjective: Chief Complaint  Patient presents with  . New Patient (Initial Visit)    getting established and wants refills. has RA and was seeing pain medicine but can no longer to afford it. said her joints are swollen and achy   Here as a new patient today.  She notes that her prior PCP left, didn't feel good with the new provider she saw .  Wants to change providers.  Was at [facility] prior.  Wants one doctor under one roof.  She has a hx/o diabetes, hypertension, hyperlipidemia, chronic pain syndrome, depression, anxiety.    Has questions about her arthritis and treatment.  Would like to stay on Cymbalta.   currently taking Cymbalta 60mg  daily, but the plan was to go up to BID.   Was taken off Tramadol due to allergy.   Was also on Oxycodone but didn't like the way it made her feel weird.has chronic pain, chronic back and hip pain.  Was suppose to have MRI of pelvic or hips.   Medical team: Was seeing Dr. Letta Median for diabetes Dr. Sanda Klein f or cardiology Was seeing pain management but can't afford to go there orthopedist Dr Marlou Sa Has see Dr. Suezanne Cheshire for renal prior.  Diabetes - using 18 u of Basaglar at bedtime.  She notes glucose numbers lately have had several lows in the 50s, 3-4 times per week.  Taking metformin, glipizide, jardiance.    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   Exercise with walking.     Past Medical History  Diagnosis Date  . Abscess     increased drainage from abscess on buttock  . Hypertension   . Anxiety   . Depression     sees [name]  . Sleep apnea     2008- sleep study, neg. for sleep apnea   . Anal fistula   . Diabetes mellitus without complication ([place])   . SVT (supraventricular tachycardia) ([place])   . Bilateral hip pain 05/27/2015  . Chronic pain syndrome 05/27/2015   ROS as in subjective    Objective: BP 120/80 mmHg  Pulse 79  Wt 251 lb (113.853 kg)  Gen: wd, wn, nad Heart: RRR, normal s1,  s2, no murmurs Lungs:  Clear Ext: no edema Pulses 2+ Hips with pain with ROM, internal ROM bilat is limited in general and due to pain    Assessment: Encounter Diagnoses  Name Primary?  . Chronic pain syndrome Yes  . Diabetes mellitus with complication ([place])   . Chronic kidney disease, unspecified stage   . Bilateral hip pain   . Essential hypertension   . Morbid obesity, unspecified obesity type ([place])      Plan: reviewed last few months of chart notes from primary care, pain clinic, last labs, imaging of back.   Labs today, advised she consider lap swimming and ellpictal or hand bike instead of more joint impacting exercise.  C/t efforts at weight loss.  Increase Cymbalta to 60mg  BID for pain but cut back on Fluoxetine to 1 tablet every other day as we increase to Cymbalta 60mg  BID.    pending labs, will modify diabetes regimen, likely stop glipizide due to hypoglycemia.  disused measure to take if hypoglycemia, prevention, symptoms of hypoglycemia.    F/u pending labs.   Keyshla was seen today for new patient (initial visit).  Diagnoses and all orders for this visit:  Chronic pain syndrome -     CBC  Diabetes mellitus with complication ([place]) -  Hemoglobin A1c  Chronic kidney disease, unspecified stage -     Comprehensive metabolic panel -     CBC  Bilateral hip pain -     CBC  Essential hypertension -     Comprehensive metabolic panel -     CBC  Morbid obesity, unspecified obesity type ([place])  Other orders -     Discontinue: DULoxetine (CYMBALTA) 60 MG capsule; Take 1 capsule (60 mg total) by mouth daily. -     DULoxetine (CYMBALTA) 60 MG capsule; Take 1 capsule (60 mg total) by mouth 2 (two) times daily.

## 2015-08-24 ENCOUNTER — Other Ambulatory Visit: Payer: Self-pay | Admitting: Medical

## 2015-08-24 ENCOUNTER — Institutional Professional Consult (permissible substitution): Payer: Self-pay | Admitting: Medical

## 2015-08-24 MED ORDER — INSULIN GLARGINE 100 UNIT/ML SOLOSTAR PEN: 30.0000 [IU] | PEN_INJECTOR | Freq: Every day | SUBCUTANEOUS | Status: DC

## 2015-08-24 MED ORDER — METFORMIN HCL 500 MG PO TABS: 500.0000 mg | ORAL_TABLET | Freq: Two times a day (BID) | ORAL | Status: DC

## 2015-08-24 MED ORDER — ATORVASTATIN CALCIUM 20 MG PO TABS: 20.0000 mg | ORAL_TABLET | Freq: Every day | ORAL | Status: DC

## 2015-08-25 ENCOUNTER — Telehealth: Payer: Self-pay | Admitting: Internal Medicine

## 2015-08-25 ENCOUNTER — Other Ambulatory Visit: Payer: Self-pay | Admitting: Medical

## 2015-08-25 MED ORDER — BASAGLAR KWIKPEN 100 UNIT/ML ~~LOC~~ SOPN: PEN_INJECTOR | SUBCUTANEOUS | Status: DC

## 2015-08-25 NOTE — Telephone Encounter (Signed)
I didn't know she was on Lantus.   That wasn't the info I had.  So no, she should not be on both, just basaglar

## 2015-08-25 NOTE — Telephone Encounter (Signed)
I am certain I put in [place].   Lantus and Basaglar are both Insulin Glargine, so I wonder if the computer has an error in the order.  Either way, have her get Basaglar which is what she is/should be on.   Don't get the Lantus.   If she already picked it up and can't return it, then she can use it the same way, but I apologize as this seems to have been a computer issue.

## 2015-08-25 NOTE — Telephone Encounter (Signed)
Pt was notified not to use LANTUS to take it back to the pharmacy. FYI- you sent in lantus for patient yesterday.

## 2015-08-25 NOTE — Telephone Encounter (Signed)
Pt called to state that she is not sure if she needs to be taking both the basgalar and lantus together. The lab notes say increase basagalar but nothing mentioned about lantus. Please advise

## 2015-08-26 NOTE — Telephone Encounter (Signed)
Pt was notified. She can not take it back to the pharmacy. Pt will use it if she runs out of basaglar

## 2015-08-30 ENCOUNTER — Other Ambulatory Visit: Payer: Self-pay | Admitting: Cardiovascular Disease

## 2015-08-30 ENCOUNTER — Telehealth: Payer: Self-pay | Admitting: Medical

## 2015-08-30 ENCOUNTER — Encounter: Payer: Self-pay | Admitting: Medical

## 2015-08-30 ENCOUNTER — Other Ambulatory Visit: Payer: Self-pay | Admitting: Medical

## 2015-08-30 NOTE — Telephone Encounter (Signed)
Rx has been sent to the pharmacy electronically. ° °

## 2015-08-30 NOTE — Telephone Encounter (Signed)
Please call patient.  I receive e-mail request for Amitiza 90 day supply.  Last visit we didn't discuss constipation nor is Amitiza listed in medication log.  Please get more info on this.

## 2015-08-31 NOTE — Telephone Encounter (Signed)
Sent a message to patient about this.

## 2015-09-05 ENCOUNTER — Telehealth: Payer: Self-pay | Admitting: Medical

## 2015-09-05 ENCOUNTER — Other Ambulatory Visit: Payer: Self-pay | Admitting: Medical

## 2015-09-05 MED ORDER — LUBIPROSTONE 24 MCG PO CAPS: 24.0000 ug | ORAL_CAPSULE | Freq: Two times a day (BID) | ORAL | Status: DC

## 2015-09-05 MED ORDER — INSULIN GLARGINE 100 UNIT/ML SOLOSTAR PEN
20.0000 [IU] | PEN_INJECTOR | Freq: Every day | SUBCUTANEOUS | Status: DC
Start: 2015-09-05 — End: 2016-01-05

## 2015-09-05 NOTE — Telephone Encounter (Signed)
pls call.  I sent Lantus to pharmacy.  She came to me on basaglar, so I had continued the same medication.  If too expensive, changes to Lantus but gradually go up from 20 units as discussed in prior message until she is seeing morning sugars under 130.  F/u in 3 wk as planned.

## 2015-09-05 NOTE — Telephone Encounter (Signed)
Pt called and stated that the new diabetes medicine pt was given has a $40 copay. She states that she can get lantus for free. She is asking of she can go back on lantus. Please advise pt at [phone].

## 2015-09-06 NOTE — Telephone Encounter (Signed)
I actually refilled it yesterday before you got to the message

## 2015-09-06 NOTE — Telephone Encounter (Signed)
In regards to [name], pt stated that she discussed it at her visit and it was said Audelia Acton would refill once she ran out. Pt stated her diabetes medications constipate her and this keeps her regulated. Wants refill.  Pt aware of previous message

## 2015-09-07 NOTE — Telephone Encounter (Signed)
LM for pt that med was refilled

## 2015-09-09 ENCOUNTER — Ambulatory Visit: Payer: BC Managed Care – PPO | Admitting: Family Medicine

## 2015-09-21 ENCOUNTER — Ambulatory Visit (INDEPENDENT_AMBULATORY_CARE_PROVIDER_SITE_OTHER): Payer: BC Managed Care – PPO | Admitting: Medical

## 2015-09-21 ENCOUNTER — Encounter: Payer: Self-pay | Admitting: Medical

## 2015-09-21 VITALS — BP 130/80 | HR 99 | Temp 98.7°F | Wt 248.0 lb

## 2015-09-21 DIAGNOSIS — R238 Other skin changes: Secondary | ICD-10-CM | POA: Insufficient documentation

## 2015-09-21 DIAGNOSIS — E1121 Type 2 diabetes mellitus with diabetic nephropathy: Secondary | ICD-10-CM | POA: Diagnosis not present

## 2015-09-21 DIAGNOSIS — L0292 Furuncle, unspecified: Secondary | ICD-10-CM

## 2015-09-21 DIAGNOSIS — L304 Erythema intertrigo: Secondary | ICD-10-CM

## 2015-09-21 DIAGNOSIS — L02213 Cutaneous abscess of chest wall: Secondary | ICD-10-CM

## 2015-09-21 DIAGNOSIS — L909 Atrophic disorder of skin, unspecified: Secondary | ICD-10-CM | POA: Diagnosis not present

## 2015-09-21 MED ORDER — DULAGLUTIDE 0.75 MG/0.5ML ~~LOC~~ SOAJ: 0.7500 mg | SUBCUTANEOUS | Status: DC

## 2015-09-21 MED ORDER — HYDROCODONE-ACETAMINOPHEN 5-325 MG PO TABS: 1.0000 | ORAL_TABLET | Freq: Four times a day (QID) | ORAL | Status: DC | PRN

## 2015-09-21 MED ORDER — CHLORHEXIDINE GLUCONATE 4 % EX LIQD: Freq: Every day | CUTANEOUS | Status: DC | PRN

## 2015-09-21 MED ORDER — SILVER SULFADIAZINE 1 % EX CREA: 1.0000 "application " | TOPICAL_CREAM | Freq: Every day | CUTANEOUS | Status: DC

## 2015-09-21 MED ORDER — CEPHALEXIN 500 MG PO CAPS: 500.0000 mg | ORAL_CAPSULE | Freq: Three times a day (TID) | ORAL | Status: DC

## 2015-09-21 MED ORDER — SULFAMETHOXAZOLE-TRIMETHOPRIM 800-160 MG PO TABS: 1.0000 | ORAL_TABLET | Freq: Two times a day (BID) | ORAL | Status: DC

## 2015-09-21 NOTE — Addendum Note (Signed)
Addended by: Carlena Hurl on: 09/21/2015 04:44 PM   Modules accepted: Level of Service

## 2015-09-21 NOTE — Progress Notes (Signed)
[place] and cancelled Keflex script as directed by [facility]. Bactrim sent in it's place.

## 2015-09-21 NOTE — Progress Notes (Addendum)
Subjective:   [name] is a 55 y.o. female who presents for evaluation of a probable cutaneous abscess. Lesion is located in the chest wall just below breasts. Onset was 2 days ago. Symptoms have gradually worsened.  Abscess has associated symptoms of pain, redness, warmth.    She also notes another boil in right vulvar region x 6 months ongoing, worse in last week, but never heals.   Prior doctor just advised as long as it drains it is ok.  She never has had I&D, nor recurrent boils otherwise.   Patient does have diabetes.  Patient denies hx/o poor wound healing, denies HIV.   Lately sugars still in the mid to high 200s.  Is up to 45 u Lantus currently. Still taking metformin BID.  No other aggravating or relieving factors.  No other c/o.  Past Medical History  Diagnosis Date  . Abscess     increased drainage from abscess on buttock  . Hypertension   . Anxiety   . Depression     sees [name]  . Sleep apnea     2008- sleep study, neg. for sleep apnea   . Anal fistula   . Diabetes mellitus without complication ([place])   . SVT (supraventricular tachycardia) ([place])   . Bilateral hip pain 05/27/2015  . Chronic pain syndrome 05/27/2015    Reviewed prior allergies, medications, past medical history, past surgical history.  ROS as in subjective   Objective:   BP 130/80 mmHg  Pulse 99  Temp(Src) 98.7 F (37.1 C) (Tympanic)  Wt 248 lb (112.492 kg)  Gen: wd, wn, nad, obese female Skin:  Mid chest wall just below breast, below sternum with 4cm round indurated area, central erythema, warmth, fluctuance and white central bulge There is pink red and macerated skin along bilat intertriginous regions of legs adjacent to vulva, there are openings/breaks in the skin along the right side that appear chronic Lateral to the vulva in the intertriginous area just anterior to the anus with 39mm round opening with brown/green pus drainage, but no obvious induration, fluctuance or  core    Assessment:     Encounter Diagnoses  Name Primary?  . Cutaneous abscess of chest wall Yes  . Type 2 diabetes mellitus with diabetic nephropathy, without long-term current use of insulin ([place])   . Skin breakdown   . Erythema intertrigo   . Furuncle      Plan:   #1: Furuncle/boil of chest wall Discussed examination findings, diagnosis, usual course of illness, and options for therapy discussed. After discussing recommendations, patient agrees to I&D, oral antibiotics.    Procedure Informed consent obtained.  The area was prepped in the usual manner and the skin overlying the abscess was anesthetized with 2cc of 1% lidocaine with epinephrine.  The area was sharply incised and approx 5ccs of purulent material was obtained.  Wound was covered with sterile bandage.    Advised patient to complete the course of oral antibiotics, use warm compresses or heat applied to the area to promote drainage.  Culture swab sent.  Follow up: 7-10days.  However, if worse signs of infections as discussed (fever, chills, nausea, vomiting, worsening redness, worsening pain), then call or return immediately.  #2: abscess/intertrigo - abscess due to skin breakdown and local infection vs fistula and deeper area of concern.   Too soon to tell.  Begin bactrim, use silvadene cream to the bilateral intertriginous areas.  Use warm soapy baths BID.  Hibiclens body wash 1-2 times per  week.  F/u 7-10 days. If resolved at that time, the goal will be to keep skin dry in the intertriginous areas and work towards weight loss.   If she continues to have signs of fistula or lingering infection, consider imaging vs referral to general surgery   #3:  Diabetes - c/t lantus 45 u QHS, c/t Metformin.  Begin Trulicity weekly for diabetes and hopefull we will see weight loss with this.  CMA demonstrated proper use of Trulicity  F/u in 0000000 days.  [name] was seen today for boil.  Diagnoses and all orders for this  visit:  Cutaneous abscess of chest wall -     WOUND CULTURE  Type 2 diabetes mellitus with diabetic nephropathy, without long-term current use of insulin ([facility])  Skin breakdown  Erythema intertrigo  Furuncle  Other orders -     HYDROcodone-acetaminophen (NORCO/VICODIN) 5-325 MG tablet; Take 1 tablet by mouth every 6 (six) hours as needed for moderate pain. -     Discontinue: cephALEXin (KEFLEX) 500 MG capsule; Take 1 capsule (500 mg total) by mouth 3 (three) times daily. -     chlorhexidine (HIBICLENS) 4 % external liquid; Apply topically daily as needed. -     sulfamethoxazole-trimethoprim (BACTRIM DS,SEPTRA DS) 800-160 MG tablet; Take 1 tablet by mouth 2 (two) times daily. -     Dulaglutide (TRULICITY) A999333 0000000 SOPN; Inject 0.75 mg into the skin once a week. -     silver sulfADIAZINE (SILVADENE) 1 % cream; Apply 1 application topically daily.   Spent > 30 minutes face to face with patient in discussion of symptoms, evaluation, plan and recommendations.

## 2015-09-21 NOTE — Patient Instructions (Signed)
Recommendations:  Begin Bactrim oral antibiotic twice daily   Begin silvadene cream in the leg folds daily  Use hot soapy bath soaks x 20 minutes each  Disinfect tub after use  Continue at 45 units Lantus daily  continue Metformin  BEGIN Trulicity weekly injection  Begin Hibiclens body wash 1- 2 times per week  She can use the Hydrocodone pain medication as needed or OTC Tylenol up to 500mg , either every 6 hours for pain  Recheck in 7-10 days.

## 2015-09-24 LAB — WOUND CULTURE: Gram Stain: NONE SEEN

## 2015-09-29 ENCOUNTER — Ambulatory Visit: Payer: BC Managed Care – PPO | Admitting: Medical

## 2015-10-19 ENCOUNTER — Encounter: Payer: Self-pay | Admitting: Medical

## 2015-10-19 ENCOUNTER — Telehealth: Payer: Self-pay | Admitting: Medical

## 2015-10-19 NOTE — Telephone Encounter (Signed)
See if we have samples

## 2015-10-20 NOTE — Telephone Encounter (Signed)
See phone message from yesterday but I think it was trulicity

## 2015-10-20 NOTE — Telephone Encounter (Signed)
Of

## 2015-10-26 ENCOUNTER — Ambulatory Visit (INDEPENDENT_AMBULATORY_CARE_PROVIDER_SITE_OTHER): Payer: BC Managed Care – PPO | Admitting: Medical

## 2015-10-26 ENCOUNTER — Ambulatory Visit
Admission: RE | Admit: 2015-10-26 | Discharge: 2015-10-26 | Disposition: A | Discharge status: Home / Self Care | Payer: BC Managed Care – PPO | Source: Ambulatory Visit | Attending: Medical | Admitting: Medical

## 2015-10-26 ENCOUNTER — Encounter: Payer: Self-pay | Admitting: Medical

## 2015-10-26 ENCOUNTER — Telehealth: Payer: Self-pay | Admitting: Medical

## 2015-10-26 VITALS — BP 118/70 | HR 94 | Wt 247.0 lb

## 2015-10-26 DIAGNOSIS — Z8709 Personal history of other diseases of the respiratory system: Secondary | ICD-10-CM | POA: Diagnosis not present

## 2015-10-26 DIAGNOSIS — F411 Generalized anxiety disorder: Secondary | ICD-10-CM | POA: Insufficient documentation

## 2015-10-26 DIAGNOSIS — E1121 Type 2 diabetes mellitus with diabetic nephropathy: Secondary | ICD-10-CM | POA: Diagnosis not present

## 2015-10-26 DIAGNOSIS — D72829 Elevated white blood cell count, unspecified: Secondary | ICD-10-CM

## 2015-10-26 DIAGNOSIS — R0602 Shortness of breath: Secondary | ICD-10-CM | POA: Diagnosis not present

## 2015-10-26 DIAGNOSIS — G4700 Insomnia, unspecified: Secondary | ICD-10-CM | POA: Diagnosis not present

## 2015-10-26 DIAGNOSIS — R0609 Other forms of dyspnea: Secondary | ICD-10-CM

## 2015-10-26 DIAGNOSIS — Z87898 Personal history of other specified conditions: Secondary | ICD-10-CM | POA: Insufficient documentation

## 2015-10-26 DIAGNOSIS — I471 Supraventricular tachycardia: Secondary | ICD-10-CM | POA: Diagnosis not present

## 2015-10-26 LAB — CBC WITH DIFFERENTIAL/PLATELET
BASOS PCT: 1 %
Basophils Absolute: 98 cells/uL (ref 0–200)
EOS PCT: 2 %
Eosinophils Absolute: 196 cells/uL (ref 15–500)
HCT: 37.5 % (ref 35.0–45.0)
HEMOGLOBIN: 11.8 g/dL (ref 11.7–15.5)
LYMPHS ABS: 2254 {cells}/uL (ref 850–3900)
LYMPHS PCT: 23 %
MCH: 25.3 pg — ABNORMAL LOW (ref 27.0–33.0)
MCHC: 31.5 g/dL — AB (ref 32.0–36.0)
MCV: 80.3 fL (ref 80.0–100.0)
MPV: 10.5 fL (ref 7.5–12.5)
Monocytes Absolute: 588 cells/uL (ref 200–950)
Monocytes Relative: 6 %
NEUTROS PCT: 68 %
Neutro Abs: 6664 cells/uL (ref 1500–7800)
Platelets: 426 10*3/uL — ABNORMAL HIGH (ref 140–400)
RBC: 4.67 MIL/uL (ref 3.80–5.10)
RDW: 14.2 % (ref 11.0–15.0)
WBC: 9.8 10*3/uL (ref 4.0–10.5)

## 2015-10-26 NOTE — Telephone Encounter (Signed)
Call back and see what other medications besides Prozac and Diazepam she has been on?  Did she feel like she did well on Prozac?     Has she been on other sleep aids prior?  If so, which ones?
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
## 2015-10-26 NOTE — Telephone Encounter (Signed)
Pt states prozac did not give her the results that she wanted. Pt has only tried Prozac and diazepam.   And has not been on any other sleep aids.

## 2015-10-26 NOTE — Telephone Encounter (Signed)
LMTCB

## 2015-10-27 ENCOUNTER — Other Ambulatory Visit: Payer: Self-pay | Admitting: *Deleted

## 2015-10-27 ENCOUNTER — Other Ambulatory Visit: Payer: Self-pay | Admitting: Medical

## 2015-10-27 ENCOUNTER — Ambulatory Visit
Admission: RE | Admit: 2015-10-27 | Discharge: 2015-10-27 | Disposition: A | Discharge status: Home / Self Care | Payer: BC Managed Care – PPO | Source: Ambulatory Visit | Attending: Medical | Admitting: Medical

## 2015-10-27 DIAGNOSIS — R7989 Other specified abnormal findings of blood chemistry: Secondary | ICD-10-CM

## 2015-10-27 DIAGNOSIS — R0602 Shortness of breath: Secondary | ICD-10-CM

## 2015-10-27 LAB — BASIC METABOLIC PANEL
BUN: 12 mg/dL (ref 7–25)
CHLORIDE: 102 mmol/L (ref 98–110)
CO2: 24 mmol/L (ref 20–31)
CREATININE: 1.2 mg/dL — AB (ref 0.50–1.05)
Calcium: 9.9 mg/dL (ref 8.6–10.4)
GLUCOSE: 290 mg/dL — AB (ref 65–99)
Potassium: 4.5 mmol/L (ref 3.5–5.3)
Sodium: 137 mmol/L (ref 135–146)

## 2015-10-27 LAB — D-DIMER, QUANTITATIVE: D-Dimer, Quant: 0.82 mcg/mL FEU — ABNORMAL HIGH (ref <0.50)

## 2015-10-27 MED ORDER — CITALOPRAM HYDROBROMIDE 20 MG PO TABS: 20.0000 mg | ORAL_TABLET | Freq: Every day | ORAL | 2 refills | Status: DC

## 2015-10-27 MED ORDER — IOPAMIDOL (ISOVUE-370) INJECTION 76%
100.0000 mL | Freq: Once | INTRAVENOUS | Status: AC | PRN
  Administered 2015-10-27: 100 mL via INTRAVENOUS

## 2015-10-28 ENCOUNTER — Encounter: Payer: Self-pay | Admitting: Medical

## 2015-10-28 NOTE — Telephone Encounter (Signed)
See result note.  

## 2015-11-08 ENCOUNTER — Ambulatory Visit (INDEPENDENT_AMBULATORY_CARE_PROVIDER_SITE_OTHER): Payer: BC Managed Care – PPO | Admitting: Family Medicine

## 2015-11-08 VITALS — BP 110/70 | HR 107 | Wt 235.0 lb

## 2015-11-08 DIAGNOSIS — R0602 Shortness of breath: Secondary | ICD-10-CM

## 2015-11-08 DIAGNOSIS — I471 Supraventricular tachycardia: Secondary | ICD-10-CM | POA: Diagnosis not present

## 2015-11-08 NOTE — Progress Notes (Signed)
   Subjective:    Patient ID: [name], female    DOB: [birth year], 55 y.o.   MRN: [id number]  HPI She is here for consultation concerning DOE. She states that over the last 3 weeks she has had increasing difficulty with shortness of breath with any physical activity. She was recently seen at [facility] for similar symptoms and apparently blood work as well as EKG was done. She does not complain of PND or edema. She states that she is even getting more short of breath now complaining of DOE with minimal exertion She does have a previous history of SVT with ablation.  Review of Systems     Objective:   Physical Exam Alert and in no distress. Cardiac exam shows regular rhythm without murmurs or gallops. Lungs are clear to auscultation.       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

## 2015-11-09 ENCOUNTER — Encounter: Payer: Self-pay | Admitting: Physician Assistant

## 2015-11-09 ENCOUNTER — Ambulatory Visit (INDEPENDENT_AMBULATORY_CARE_PROVIDER_SITE_OTHER): Payer: BC Managed Care – PPO | Admitting: Physician Assistant

## 2015-11-09 VITALS — BP 112/68 | HR 112 | Ht 63.0 in | Wt 239.2 lb

## 2015-11-09 DIAGNOSIS — Z79899 Other long term (current) drug therapy: Secondary | ICD-10-CM

## 2015-11-09 DIAGNOSIS — R0609 Other forms of dyspnea: Secondary | ICD-10-CM

## 2015-11-09 DIAGNOSIS — R5383 Other fatigue: Secondary | ICD-10-CM

## 2015-11-09 DIAGNOSIS — I471 Supraventricular tachycardia: Secondary | ICD-10-CM | POA: Diagnosis not present

## 2015-11-09 DIAGNOSIS — R000 Tachycardia, unspecified: Secondary | ICD-10-CM

## 2015-11-09 LAB — BASIC METABOLIC PANEL
BUN: 26 mg/dL — ABNORMAL HIGH (ref 7–25)
CALCIUM: 9.7 mg/dL (ref 8.6–10.4)
CHLORIDE: 93 mmol/L — AB (ref 98–110)
CO2: 24 mmol/L (ref 20–31)
CREATININE: 1.43 mg/dL — AB (ref 0.50–1.05)
Glucose, Bld: 378 mg/dL — ABNORMAL HIGH (ref 65–99)
Potassium: 4.5 mmol/L (ref 3.5–5.3)
Sodium: 131 mmol/L — ABNORMAL LOW (ref 135–146)

## 2015-11-09 LAB — TSH: TSH: 0.46 m[IU]/L

## 2015-11-09 MED ORDER — METOPROLOL TARTRATE 50 MG PO TABS: 50.0000 mg | ORAL_TABLET | Freq: Two times a day (BID) | ORAL | 9 refills | Status: DC

## 2015-11-09 NOTE — Patient Instructions (Addendum)
Medication Instructions:  INCREASE Metoprolol 50 mg twice a day and HOLD Lisinopril until further notice  Labwork: BMP and TSH Today  Testing/Procedures: None Ordered  Follow-Up: Your physician recommends that you schedule a follow-up appointment in: Dr Sallyanne Kuster next available   Any Other Special Instructions Will Be Listed Below (If Applicable).  Follow weight daily   If you need a refill on your cardiac medications before your next appointment, please call your pharmacy.

## 2015-11-09 NOTE — Progress Notes (Signed)
Cardiology Office Note   Date:  11/09/2015   ID:  [name], DOB [birth year], MRN [id number]  PCP:  Theresa Oxford, PA-C  Cardiologist:  Dr Curt Bears for EP, Dr Hughie Closs, Theresa Marker, PA-C    History of Present Illness: [name] is a 55 y.o. female with a history of HTN, OSA, DM, SVT w/ ablation  [name] presents for evaluation of tachycardia.  She has been compliant with her medications including Lasix. She has had no lower extremity edema. She has had some orthostatic dizziness, but has not checked her blood pressure to see how low her blood pressure may be dropping.  She has been noticing a steady increase in her heart rate. She will feel it when she does anything in her heart rate is elevated even at rest. She has a fitbit and is able to track her heart rate. The highest heart rate she has seen is 136 bpm. Her resting heart rate is frequently greater than 100. She is compliant with her metoprolol at 25 mg twice a day.  [name] has also noted increasing dyspnea on exertion. She has not had lower extremity edema, and her weight has been decreasing. However, she has noticed increasing shortness of breath and even had to leave work yesterday because she just could not walk fast enough far enough to do her job as a Producer, television/film/video at school. She is very concerned about this. Her weight is decreasing and she is not adding salt to foods. She is not having lower extremity edema and denies orthopnea or PND. She is compliant with daily Lasix.    Past Medical History:  Diagnosis Date  . Abscess    increased drainage from abscess on buttock  . Anal fistula   . Anxiety   . Bilateral hip pain 05/27/2015  . Chronic pain syndrome 05/27/2015  . Depression    sees Dr. Barrie [name]  . Diabetes mellitus without complication ([place])   . Hypertension   . Sleep apnea    2008- sleep study, neg. for sleep apnea   . SVT (supraventricular tachycardia) (HCC)      Past Surgical History:  Procedure Laterality Date  . ANAL EXAMINATION UNDER ANESTHESIA  02/21/11   anal fistula  . BREAST SURGERY  patient does not remember date of procedure   pull fluid off lft br  . ELECTROPHYSIOLOGIC STUDY N/A 05/05/2015   Procedure: SVT Ablation;  Surgeon: Will Meredith Leeds, MD;  Location: [facility];  Service: Cardiovascular;  Laterality: N/A;  . INCISE AND DRAIN ABCESS     abscess on right thigh and buttock  . KNEE ARTHROSCOPY     left  . SHOULDER SURGERY  04/14/09   right    Current Outpatient Prescriptions  Medication Sig Dispense Refill  . atorvastatin (LIPITOR) 20 MG tablet Take 1 tablet (20 mg total) by mouth at bedtime. 90 tablet 1  . citalopram (CELEXA) 20 MG tablet Take 1 tablet (20 mg total) by mouth daily. 30 tablet 2  . Dulaglutide (TRULICITY) A999333 0000000 SOPN Inject 0.75 mg into the skin once a week. 0.5 mL 2  . DULoxetine (CYMBALTA) 60 MG capsule Take 1 capsule (60 mg total) by mouth 2 (two) times daily. 60 capsule 2  . ferrous sulfate 325 (65 FE) MG tablet Take 1 tablet (325 mg total) by mouth 2 (two) times daily with a meal. 60 tablet 2  . furosemide (LASIX) 40 MG tablet TAKE ONE TABLET BY MOUTH ONCE DAILY 90  tablet 2  . Insulin Glargine (LANTUS SOLOSTAR) 100 UNIT/ML Solostar Pen Inject 20 Units into the skin daily at 10 pm. 15 mL 3  . lisinopril (PRINIVIL,ZESTRIL) 10 MG tablet Take 1 tablet (10 mg total) by mouth daily. 30 tablet 9  . lubiprostone (AMITIZA) 24 MCG capsule Take 1 capsule (24 mcg total) by mouth 2 (two) times daily with a meal. 180 capsule 0  . metFORMIN (GLUCOPHAGE) 500 MG tablet Take 1 tablet (500 mg total) by mouth 2 (two) times daily with a meal. 180 tablet 3  . omeprazole (PRILOSEC) 20 MG capsule Take 1 capsule (20 mg total) by mouth daily. 30 capsule 3  . metoprolol (LOPRESSOR) 50 MG tablet Take 1 tablet (50 mg total) by mouth 2 (two) times daily. 60 tablet 9   No current facility-administered medications for  this visit.     Allergies:   Tramadol    Social History:  The patient  reports that she has never smoked. She has never used smokeless tobacco. She reports that she does not drink alcohol or use drugs.   Family History:  The patient's family history includes Alzheimer's disease in her mother; Cancer in her sister; Diabetes in her father; Hypertension in her mother.    ROS:  Please see the history of present illness. All other systems are reviewed and negative.    PHYSICAL EXAM: VS:  BP 112/68   Pulse (!) 112   Ht 5\' 3"  (1.6 m)   Wt 239 lb 3.2 oz (108.5 kg)   SpO2 99%   BMI 42.37 kg/m  , BMI Body mass index is 42.37 kg/m. GEN: Well nourished, well developed, female in no acute distress  HEENT: normal for age  Neck: no JVDSeen but difficult to assess secondary to body habitus, no carotid bruit, no masses Cardiac: RRR; soft murmur, no rubs, or gallops Respiratory:  clear to auscultation bilaterally, normal work of breathing GI: soft, nontender, nondistended, + BS MS: no deformity or atrophy; no edema; distal pulses are 2+ in all 4 extremities   Skin: warm and dry, no rash Neuro:  Strength and sensation are intact Psych: euthymic mood, full affect   EKG:  EKG is ordered today. The ekg ordered today demonstrates sinus tachycardia, rate 112, no morphology changes from previous ECG except those related to the increased rate   Recent Labs: 04/15/2015: TSH 0.49 08/23/2015: ALT 13 10/26/2015: BUN 12; Creat 1.20; Hemoglobin 11.8; Platelets 426; Potassium 4.5; Sodium 137    Lipid Panel    Component Value Date/Time   CHOL 175 12/07/2014 1534   TRIG 220.0 (H) 12/07/2014 1534   HDL 30.10 (L) 12/07/2014 1534   CHOLHDL 6 12/07/2014 1534   VLDL 44.0 (H) 12/07/2014 1534   LDLDIRECT 127.0 12/07/2014 1534     Wt Readings from Last 3 Encounters:  11/09/15 239 lb 3.2 oz (108.5 kg)  11/08/15 235 lb (106.6 kg)  10/26/15 247 lb (112 kg)     Other studies Reviewed: Additional studies/  records that were reviewed today include: Office notes and testing.  ASSESSMENT AND PLAN:  1.  Tachycardia: She has not had a recent assessment of her TSH and she has had thyroid problems in the past. We will check a TSH. At this time, have no clear indication that it is anything besides sinus tachycardia so we will not pursue an event monitor at this time  She is getting some orthostatic symptoms as well. We will hold her Lasix for one day and then decrease  it from 40 mg down to 20 mg daily. She is to continue to check daily weights and monitor her fluid intake. She is to call us if she begins getting lower extremity edema or other problems. If her heart rate is elevated and she does not understand why, she is encouraged to drink a glass of water.  We will increase her metoprolol from 25 >50 mg bid to try to decrease her heart rate. We will hold the lisinopril for now to allow blood pressure to tolerate the increased metoprolol.  2. Dyspnea on exertion: This may be related to her heart rate. She recently had a CT which showed no structural abnormalities, no pleural or pericardial effusion, heart size was normal. If the above therapies do not improve her dyspnea on exertion, CPX testing can be pursued   Current medicines are reviewed at length with the patient today.  The patient does not have concerns regarding medicines.  The following changes have been made:  Increase metoprolol, decrease Lasix and hold lisinopril  Labs/ tests ordered today include:   Orders Placed This Encounter  Procedures  . Basic Metabolic Panel (BMET)  . TSH  . EKG 12-Lead     Disposition:   FU with Dr. Sallyanne Kuster  Signed, Rosaria Ferries, PA-C  11/09/2015 10:54 AM    [place] Phone: [phone]; Fax: [phone]  This note was written with the assistance of speech recognition software. Please excuse any transcriptional errors.

## 2015-11-09 NOTE — Progress Notes (Signed)
Thank you, agree with plan. [name]

## 2015-11-17 ENCOUNTER — Telehealth: Payer: Self-pay | Admitting: Cardiovascular Disease

## 2015-11-17 NOTE — Telephone Encounter (Signed)
New message ° ° ° ° ° °Pt calling to get test results. Please call.  °

## 2015-11-17 NOTE — Telephone Encounter (Signed)
Returned call to patient-made aware of lab results:  Notes Recorded by Lonn Georgia, PA-C on 11/10/2015 at 4:21 PM EDT Pt of Dr C. Please let her know her thyroid is on the low side of normal but normal.  Her BMET shows very high blood sugars, work with primary MD on lowering these. Her kidney function shows that she may be a little dry, hold the Lasix 2 more days and restart at 1/2 tab as directed.   Pt reports she has not taken her Lasix since OV (8/30)  Advised to continue 1/2 tab (20mg ) as directed (Note recorded on 8/31). Advised to call with further questions/concerns.  Pt aware of f/u visit with MD Croitoru on 10/5 @ 4pm at [facility] location.

## 2015-11-28 ENCOUNTER — Encounter: Payer: Self-pay | Admitting: Physical Medicine & Rehabilitation

## 2015-11-28 ENCOUNTER — Encounter
Payer: BC Managed Care – PPO | Attending: Physical Medicine & Rehabilitation | Admitting: Physical Medicine & Rehabilitation

## 2015-11-28 VITALS — BP 101/67 | HR 85 | Resp 14

## 2015-11-28 DIAGNOSIS — I10 Essential (primary) hypertension: Secondary | ICD-10-CM | POA: Insufficient documentation

## 2015-11-28 DIAGNOSIS — M25552 Pain in left hip: Secondary | ICD-10-CM | POA: Diagnosis not present

## 2015-11-28 DIAGNOSIS — Z833 Family history of diabetes mellitus: Secondary | ICD-10-CM | POA: Diagnosis not present

## 2015-11-28 DIAGNOSIS — M160 Bilateral primary osteoarthritis of hip: Secondary | ICD-10-CM

## 2015-11-28 DIAGNOSIS — E119 Type 2 diabetes mellitus without complications: Secondary | ICD-10-CM | POA: Insufficient documentation

## 2015-11-28 DIAGNOSIS — G4730 Sleep apnea, unspecified: Secondary | ICD-10-CM | POA: Insufficient documentation

## 2015-11-28 DIAGNOSIS — Z809 Family history of malignant neoplasm, unspecified: Secondary | ICD-10-CM | POA: Diagnosis not present

## 2015-11-28 DIAGNOSIS — M129 Arthropathy, unspecified: Secondary | ICD-10-CM | POA: Diagnosis not present

## 2015-11-28 DIAGNOSIS — G8929 Other chronic pain: Secondary | ICD-10-CM | POA: Insufficient documentation

## 2015-11-28 DIAGNOSIS — I471 Supraventricular tachycardia: Secondary | ICD-10-CM | POA: Insufficient documentation

## 2015-11-28 DIAGNOSIS — F418 Other specified anxiety disorders: Secondary | ICD-10-CM | POA: Insufficient documentation

## 2015-11-28 DIAGNOSIS — Z8249 Family history of ischemic heart disease and other diseases of the circulatory system: Secondary | ICD-10-CM | POA: Diagnosis not present

## 2015-11-28 DIAGNOSIS — M25551 Pain in right hip: Secondary | ICD-10-CM | POA: Diagnosis not present

## 2015-11-28 MED ORDER — HYDROCODONE-ACETAMINOPHEN 5-325 MG PO TABS: 1.0000 | ORAL_TABLET | Freq: Four times a day (QID) | ORAL | 0 refills | Status: DC | PRN

## 2015-11-28 NOTE — Patient Instructions (Signed)
CONTINUE TO WORK ON YOUR WEIGHT WITH YOUR DIET CHANGES---YOU'RE DOING A GREAT JOB!   PLEASE CALL ME WITH ANY PROBLEMS OR QUESTIONS [phone])

## 2015-11-28 NOTE — Progress Notes (Signed)
Subjective:    Patient ID: [name], female    DOB: 15-Nov-1960, 55 y.o.   MRN: [id number]  HPI   This is a follow up visit for [name]. She has had worsening hip pain left greater than right. She did not go for her MRI due to the fact that she developed pneumonia. She had not returned for a visit due to problems with her co-pay. Her pain is worst with weight bearing and when she has to get up from a seated position  She has been told by Dr. Alphonzo Severance that she needs bilateral hip replacements, but she hasn't pursued due to the fact that she's the sole supporter for family. She is a Optometrist at [employer]    For pain currently, she's using tylenol only. However, she's taking 5000mg  daily!! She uses heat at night. She had been on oxycodone per Dr. Posey Pronto. She states that norco worked better for her however.          Pain Inventory Average Pain 7 Pain Right Now 8 My pain is sharp and aching  In the last 24 hours, has pain interfered with the following? General activity 10 Relation with others 10 Enjoyment of life 10 What TIME of day is your pain at its worst? daytime and evening Sleep (in general) Poor  Pain is worse with: walking, standing and some activites Pain improves with: rest Relief from Meds: 0  Mobility use a cane how many minutes can you walk? 15-20 ability to climb steps?  yes do you drive?  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  no  Physicians involved in your care Any changes since last visit?  no   Family History  Problem Relation Age of Onset  . Hypertension Mother   . Alzheimer's disease Mother   . Diabetes Father   . Anesthesia problems Neg Hx   . Hypotension Neg Hx   . Malignant hyperthermia Neg Hx   . Pseudochol deficiency  Neg Hx   . Cancer Sister    Social History   Social History  . Marital status: Divorced    Spouse name: N/A  . Number of children: N/A  . Years of education: N/A   Social History Main Topics  . Smoking status: Never Smoker  . Smokeless tobacco: Never Used  . Alcohol use No  . Drug use: No  . Sexual activity: Yes   Other Topics Concern  . None   Social History Narrative  . None   Past Surgical History:  Procedure Laterality Date  . ANAL EXAMINATION UNDER ANESTHESIA  02/21/11   anal fistula  . BREAST SURGERY  patient does not remember date of procedure   pull fluid off lft br  . ELECTROPHYSIOLOGIC STUDY N/A 05/05/2015   Procedure: SVT Ablation;  Surgeon: Will Meredith Leeds, MD;  Location: [place] [facility];  Service: Cardiovascular;  Laterality: N/A;  . INCISE AND DRAIN ABCESS     abscess on right thigh and buttock  . KNEE ARTHROSCOPY     left  . SHOULDER SURGERY  04/14/09   right   Past Medical History:  Diagnosis Date  . Abscess    increased drainage from abscess on buttock  . Anal fistula   . Anxiety   . Bilateral hip pain 05/27/2015  .  Chronic pain syndrome 05/27/2015  . Depression    sees [name]  . Diabetes mellitus without complication ([place])   . Hypertension   . Sleep apnea    2008- sleep study, neg. for sleep apnea   . SVT (supraventricular tachycardia) (H[facility])    BP 101/67   Pulse 85   Resp 14   SpO2 95%   Opioid Risk Score:   Fall Risk Score:  `1  Depression screen PHQ 2/9  Depression screen [facility] 2/9 11/28/2015 10/26/2015 05/12/2015 09/28/2014 08/23/2014 06/23/2014 06/03/2014  Decreased Interest 0 3 2 1  0 0 0  Down, Depressed, Hopeless 0 3 1 0 0 0 0  PHQ - 2 Score 0 6 3 1  0 0 0  Altered sleeping - 3 3 - - - -  Tired, decreased energy - 3 1 - - - -  Change in appetite - 3 1 - - - -  Feeling bad or failure about yourself  - 3 0 - - - -  Trouble concentrating - 3 0 - - - -  Moving slowly or fidgety/restless - 0 0 - - - -  Suicidal thoughts - 0 0 -  - - -  PHQ-9 Score - 21 8 - - - -  Difficult doing work/chores - Very difficult Somewhat difficult - - - -    Review of Systems  Constitutional: Positive for unexpected weight change.  All other systems reviewed and are negative.      Objective:   Physical Exam Gen: morbidly obese HENT: Normocephalic, Atraumatic Eyes: EOMI, Conj WNL Cardio: S1, S2 normal, RRR Pulm: B/l clear to auscultation. Effort normal Abd: +obese. Soft, non-distended, non-tender, BS+ MSK: Gait Antalgic.  TTP in lower back around hips with ? + FABERs  for b/l hip pain with minimal movement.  Neuro: CN II-XII grossly intact.  Sensation intact to light touch in all LE dermatomes Reflexes 1+ in b/l LE Strength 5/5 in all LE myotomes Neg SLR b/l Skin: Warm and Dry    Assessment & Plan:  55 y/o female with pmh of SVT, DM, HTN, anxiety/depression and psh of left knee scope and right shoulder scope presents in b/l hip pain, L>R.   1. 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  -needs hip replacements  -will try norco 5/325 one q6 prn for now  -encouraged heat/ice also  -appropriate shoewear  - 3. Morbid obesity working on weight loss with improved diet -still working on losing more.  Follo wup in a month. Thirty minutes of face to face patient care time were spent during this visit. All questions were encouraged and answered. Marland Kitchen

## 2015-12-05 ENCOUNTER — Telehealth: Payer: Self-pay | Admitting: Cardiovascular Disease

## 2015-12-05 NOTE — Telephone Encounter (Signed)
New Message  Pt call requesting to speak with RN. Pt wanted to be seen sooner than appt scheduled. I offered appt for wed 9/27 pt states she think she needs to be seen sooner than that availability. Pt stats her heart is still continuing to race. Pt states she was seen in the hospital by Rosaria Ferries and was prescribed a higher dosage for med. Pt states the higher dosage worked for about a week and now heart heart has started back racing. Please call back to discuss

## 2015-12-05 NOTE — Telephone Encounter (Signed)
I returned call to patient, who notes she had been experiencing symptomatic tachycardia (SOB & fatigue accompanied it) when seen by Rosaria Ferries approx 1 month ago. Received instruction at appt to increase metoprolol from 25mg  to 50mg  BID. She did so, and noted improvement with her symptoms. States since then symptoms have returned - she has been dealing with recurrent episodes for about a week now.  Requested to be seen in office after 3pm this week due to work schedule. Aware that I have limited options, but I was able to schedule her Wednesday w Cecilie Kicks @3 :30p - soonest available. Patient has been advised that if she experiences worsening of symptoms she may need to go to ER. She had reported HR "as high as 127" today - rate doesn't stay high, also has had some return of swelling in feet. BP was 110/70 when checked. Will see if any med adjustments recommended in interim.

## 2015-12-05 NOTE — Telephone Encounter (Signed)
OK to increase metoprolol to 50 mg three times daily between now and follow up visit Wednesday

## 2015-12-06 ENCOUNTER — Encounter: Payer: Self-pay | Admitting: Cardiology

## 2015-12-06 NOTE — Telephone Encounter (Signed)
Left msg to call.

## 2015-12-06 NOTE — Telephone Encounter (Signed)
Left detail message concerning increase metoprolol to 50 mg three times a day  Per Dr Sallyanne Kuster until next appointment. Any question may call back.

## 2015-12-06 NOTE — Telephone Encounter (Signed)
Follow up ° ° ° ° ° °Returning a call to the nurse °

## 2015-12-07 ENCOUNTER — Ambulatory Visit (INDEPENDENT_AMBULATORY_CARE_PROVIDER_SITE_OTHER): Payer: BC Managed Care – PPO | Admitting: Cardiology

## 2015-12-07 ENCOUNTER — Encounter: Payer: Self-pay | Admitting: Cardiology

## 2015-12-07 VITALS — BP 120/60 | HR 94 | Ht 63.0 in | Wt 245.4 lb

## 2015-12-07 DIAGNOSIS — R55 Syncope and collapse: Secondary | ICD-10-CM

## 2015-12-07 DIAGNOSIS — E1121 Type 2 diabetes mellitus with diabetic nephropathy: Secondary | ICD-10-CM

## 2015-12-07 DIAGNOSIS — I951 Orthostatic hypotension: Secondary | ICD-10-CM

## 2015-12-07 DIAGNOSIS — I471 Supraventricular tachycardia, unspecified: Secondary | ICD-10-CM

## 2015-12-07 DIAGNOSIS — R0789 Other chest pain: Secondary | ICD-10-CM

## 2015-12-07 MED ORDER — FUROSEMIDE 40 MG PO TABS: 20.0000 mg | ORAL_TABLET | Freq: Every day | ORAL | 2 refills | Status: DC

## 2015-12-07 NOTE — Patient Instructions (Addendum)
Medication Instructions:   1. STOP TAKING LISNOPRIL 10 MG   2.START TAKING  20 MG  ( HALF TABLET OF 40 MG ) OF  LASIX ONCE A DAY    If you need a refill on your cardiac medications before your next appointment, please call your pharmacy.  Labwork: BMET    Testing/Procedures: Your physician has recommended that you wear an event monitor. FOR 2 WEEKS Event monitors are medical devices that record the heart's electrical activity. Doctors most often [place] these monitors to diagnose arrhythmias. Arrhythmias are problems with the speed or rhythm of the heartbeat. The monitor is a small, portable device. You can wear one while you do your normal daily activities. This is usually used to diagnose what is causing palpitations/syncope (passing out). '   Follow-Up: KEEP APPT AS SCHEDULED  WITH  DR C    Any Other Special Instructions Will Be Listed Below (If Applicable).

## 2015-12-07 NOTE — Progress Notes (Signed)
Cardiology Office Note   Date:  12/07/2015   ID:  [name], DOB Aug 06, 1960, MRN [id number]  PCP:  Crisoforo Oxford, PA-C  Cardiologist:  Dr Curt Bears for EP, Dr Sallyanne Kuster     Chief Complaint  Patient presents with  . Tachycardia  . Loss of Consciousness      History of Present Illness: [name] is a 55 y.o. female who presents for tachycardia.  More than on her last visit despite increase of her BB to 50 mg TID. She also tells me she passed out two weeks ago and then very dizzy last week and fell off toilet.    She has a history of HTN, OSA, DM,  And in Feb. SVT w/ ablation  Was seen in August for increasing HR.  Her metoprolol was increased to 50 mg BID and since then to 50 mg TID.  Her lasix was stopped but restarted due to edema. She is on 40 mg daily.   With rapid HR she does have some chest pressure which she never had before. But does occur with the rapid HR.  At times 130 or greater.   Occurring more frequently.  It has awakened her from sleep on occasion.   2 weeks ago she was doing laundry and was carrying it to room and her roommate woke her -she was on the floor on her back, no recollection of what happened.  No dizziness.  She did not loose bowel or bladder control- did not go to ER.  Then last week she was sitting on toilet and became very dizzy and fell off.  No injury either time.    Past Medical History:  Diagnosis Date  . Abscess    increased drainage from abscess on buttock  . Anal fistula   . Anxiety   . Bilateral hip pain 05/27/2015  . Chronic pain syndrome 05/27/2015  . Depression    sees [name]  . Diabetes mellitus without complication ([place])   . Hypertension   . Sleep apnea    2008- sleep study, neg. for sleep apnea   . SVT (supraventricular tachycardia) (HCC)     Past Surgical History:  Procedure Laterality Date  . ANAL EXAMINATION UNDER ANESTHESIA  02/21/11   anal fistula  . BREAST SURGERY  patient does not remember  date of procedure   pull fluid off lft br  . ELECTROPHYSIOLOGIC STUDY N/A 05/05/2015   Procedure: SVT Ablation;  Surgeon: Will Meredith Leeds, MD;  Location: [facility];  Service: Cardiovascular;  Laterality: N/A;  . INCISE AND DRAIN ABCESS     abscess on right thigh and buttock  . KNEE ARTHROSCOPY     left  . SHOULDER SURGERY  04/14/09   right     Current Outpatient Prescriptions  Medication Sig Dispense Refill  . atorvastatin (LIPITOR) 20 MG tablet Take 1 tablet (20 mg total) by mouth at bedtime. 90 tablet 1  . citalopram (CELEXA) 20 MG tablet Take 1 tablet (20 mg total) by mouth daily. 30 tablet 2  . Dulaglutide (TRULICITY) A999333 0000000 SOPN Inject 0.75 mg into the skin once a week. 0.5 mL 2  . DULoxetine (CYMBALTA) 60 MG capsule Take 1 capsule (60 mg total) by mouth 2 (two) times daily. 60 capsule 2  . ferrous sulfate 325 (65 FE) MG tablet Take 1 tablet (325 mg total) by mouth 2 (two) times daily with a meal. 60 tablet 2  . furosemide (LASIX) 40 MG tablet Take 0.5 tablets (  20 mg total) by mouth daily. 90 tablet 2  . HYDROcodone-acetaminophen (NORCO/VICODIN) 5-325 MG tablet Take 1 tablet by mouth every 6 (six) hours as needed for moderate pain. 120 tablet 0  . Insulin Glargine (LANTUS SOLOSTAR) 100 UNIT/ML Solostar Pen Inject 20 Units into the skin daily at 10 pm. 15 mL 3  . lubiprostone (AMITIZA) 24 MCG capsule Take 1 capsule (24 mcg total) by mouth 2 (two) times daily with a meal. 180 capsule 0  . metFORMIN (GLUCOPHAGE) 500 MG tablet Take 1 tablet (500 mg total) by mouth 2 (two) times daily with a meal. 180 tablet 3  . metoprolol (LOPRESSOR) 50 MG tablet Take 1 tablet (50 mg total) by mouth 2 (two) times daily. 60 tablet 9  . omeprazole (PRILOSEC) 20 MG capsule Take 1 capsule (20 mg total) by mouth daily. 30 capsule 3   No current facility-administered medications for this visit.     Allergies:   Tramadol    Social History:  The patient  reports that she has never smoked.  She has never used smokeless tobacco. She reports that she does not drink alcohol or use drugs.   Family History:  The patient's family history includes Alzheimer's disease in her mother; Cancer in her sister; Diabetes in her father; Hypertension in her mother.    ROS:  General:no colds or fevers, no weight changes- wt gain but she states she has lost. Skin:no rashes or ulcers HEENT:no blurred vision, no congestion CV:see HPI PUL:see HPI GI:no diarrhea constipation or melena, no indigestion GU:no hematuria, no dysuria MS:no joint pain, no claudication Neuro:+ syncope, + lightheadedness with fall Endo:+ diabetes has been up and down, no thyroid disease recently checked and was stable.   Wt Readings from Last 3 Encounters:  12/07/15 245 lb 6.4 oz (111.3 kg)  11/09/15 239 lb 3.2 oz (108.5 kg)  11/08/15 235 lb (106.6 kg)     PHYSICAL EXAM: VS:  BP 120/60   Pulse 94   Ht 5\' 3"  (1.6 m)   Wt 245 lb 6.4 oz (111.3 kg)   SpO2 94%   BMI 43.47 kg/m  , BMI Body mass index is 43.47 kg/m. General:Pleasant affect, NAD Skin:Warm and dry, brisk capillary refill HEENT:normocephalic, sclera clear, mucus membranes moist Neck:supple, no JVD, no bruits  Heart:S1S2 RRR without murmur, gallup, rub or click Lungs:clear without rales, rhonchi, or wheezes [id number], non tender, + BS, do not palpate liver spleen or masses Ext:no to trace lower ext edema, 2+ pedal pulses, 2+ radial pulses Neuro:alert and oriented X 3, MAE, follows commands, + facial symmetry    EKG:  EKG is NOT ordered today. Reviewed EKG from August.  ST.   Recent Labs: 08/23/2015: ALT 13 10/26/2015: Hemoglobin 11.8; Platelets 426 11/09/2015: BUN 26; Creat 1.43; Potassium 4.5; Sodium 131; TSH 0.46    Lipid Panel    Component Value Date/Time   CHOL 175 12/07/2014 1534   TRIG 220.0 (H) 12/07/2014 1534   HDL 30.10 (L) 12/07/2014 1534   CHOLHDL 6 12/07/2014 1534   VLDL 44.0 (H) 12/07/2014 1534   LDLDIRECT 127.0 12/07/2014  1534       Other studies Reviewed: Additional studies/ records that were reviewed today include: .  ECHO:  Study Conclusions  - Left ventricle: The cavity size was normal. Wall thickness was   normal. Systolic function was normal. The estimated ejection   fraction was in the range of 60% to 65%. Left ventricular   diastolic function parameters were normal. - Aortic valve:  Structurally normal valve. Trileaflet.   Transvalvular velocity was minimally increased. There was no   stenosis. - Left atrium: The atrium was normal in size.  Impressions:  - Normal study, minimally increased LVOT velocity.  ASSESSMENT AND PLAN:  1.  Syncope 1 episode and 1 episode of dizziness with fall, no injuries, unsure if BP drops she does have some orthostatisis here- will stop lisinopril for now though she would benefit from an ACE with DM.  Her Cr was elevated as well so for now stop lisinopril.  Will decrease her lasix to 20 mg.  Will have her wear a 2 week event monitor to evaluate her tachycardia and with syncope to see if Bradycardia.  Keep appt with Dr. Sallyanne Kuster   2.  Tachycardia as above continue BB at 50 mg TID.Hx of SVT ablation.   3. CKD -3 with elevated Cr on last check will recheck today.  4. DM followed by PCP.   5. Chest pressure may be related to tachycardia      Current medicines are reviewed with the patient today.  The patient Has no concerns regarding medicines.  The following changes have been made:  See above Labs/ tests ordered today include:see above  Disposition:   FU:  see above  Signed, Cecilie Kicks, NP  12/07/2015 4:32 PM    [facility] [address] [place], [place] Phone: [phone]; Fax: [phone]

## 2015-12-08 LAB — BASIC METABOLIC PANEL
BUN: 21 mg/dL (ref 7–25)
CHLORIDE: 97 mmol/L — AB (ref 98–110)
CO2: 23 mmol/L (ref 20–31)
CREATININE: 1.28 mg/dL — AB (ref 0.50–1.05)
Calcium: 9.5 mg/dL (ref 8.6–10.4)
Glucose, Bld: 282 mg/dL — ABNORMAL HIGH (ref 65–99)
POTASSIUM: 3.7 mmol/L (ref 3.5–5.3)
Sodium: 136 mmol/L (ref 135–146)

## 2015-12-14 ENCOUNTER — Other Ambulatory Visit: Payer: Self-pay | Admitting: Cardiology

## 2015-12-14 ENCOUNTER — Ambulatory Visit (INDEPENDENT_AMBULATORY_CARE_PROVIDER_SITE_OTHER): Payer: BC Managed Care – PPO

## 2015-12-14 DIAGNOSIS — R42 Dizziness and giddiness: Secondary | ICD-10-CM | POA: Diagnosis not present

## 2015-12-14 DIAGNOSIS — I471 Supraventricular tachycardia: Secondary | ICD-10-CM

## 2015-12-15 ENCOUNTER — Encounter: Payer: BC Managed Care – PPO | Admitting: Family Medicine

## 2015-12-15 ENCOUNTER — Ambulatory Visit: Payer: BC Managed Care – PPO | Admitting: Cardiovascular Disease

## 2015-12-19 ENCOUNTER — Ambulatory Visit: Payer: BC Managed Care – PPO | Admitting: Physical Medicine & Rehabilitation

## 2015-12-19 ENCOUNTER — Telehealth: Payer: Self-pay | Admitting: Cardiovascular Disease

## 2015-12-19 NOTE — Telephone Encounter (Signed)
Pt wants to know if she can stop wearing her monitor,it is driving her crazy. It causes her to have anxiety attacks,please check to see if they have enough data. Please let her know asap if she can stop wearing it.Marland Kitchen,

## 2015-12-19 NOTE — Telephone Encounter (Signed)
Spoke with Dr C he states that strip from event monitor is normal even when pt activated.  Relayed dr Lurline Del message to pt and informed pt to try to continue to wear the monitor and turn on DND before bedtime to see if that helps with pt waking from the monitor. Also will check into getting hypo-allergenic leads for pt  Will call pt back with update

## 2015-12-19 NOTE — Telephone Encounter (Signed)
[employer] representative and they will send her some hypoallergenic electrodes in the mail. Pt notified

## 2015-12-19 NOTE — Telephone Encounter (Signed)
Spoke with pt states that she has had her event monitor on since 12-14-15 is causing her anxiety. She states that when she is sleeping the "blue light goes around and around" and wakes her up at night also, the leads are causing her itching and crawling sensation. Pt denies any syncope or palpitations. Pt would,like to know how much longer she "needs to keep it on"?  Will update Dr Sallyanne Kuster with call and event strip and call pt back with his update.

## 2015-12-20 ENCOUNTER — Encounter: Payer: BC Managed Care – PPO | Attending: Physical Medicine & Rehabilitation | Admitting: Registered Nurse

## 2015-12-20 ENCOUNTER — Encounter: Payer: Self-pay | Admitting: Registered Nurse

## 2015-12-20 VITALS — BP 101/67 | HR 80

## 2015-12-20 DIAGNOSIS — F418 Other specified anxiety disorders: Secondary | ICD-10-CM | POA: Diagnosis not present

## 2015-12-20 DIAGNOSIS — M25552 Pain in left hip: Secondary | ICD-10-CM | POA: Diagnosis not present

## 2015-12-20 DIAGNOSIS — M25551 Pain in right hip: Secondary | ICD-10-CM | POA: Insufficient documentation

## 2015-12-20 DIAGNOSIS — M160 Bilateral primary osteoarthritis of hip: Secondary | ICD-10-CM | POA: Diagnosis not present

## 2015-12-20 DIAGNOSIS — Z8249 Family history of ischemic heart disease and other diseases of the circulatory system: Secondary | ICD-10-CM | POA: Insufficient documentation

## 2015-12-20 DIAGNOSIS — G4730 Sleep apnea, unspecified: Secondary | ICD-10-CM | POA: Diagnosis not present

## 2015-12-20 DIAGNOSIS — Z5181 Encounter for therapeutic drug level monitoring: Secondary | ICD-10-CM

## 2015-12-20 DIAGNOSIS — G894 Chronic pain syndrome: Secondary | ICD-10-CM

## 2015-12-20 DIAGNOSIS — G8929 Other chronic pain: Secondary | ICD-10-CM | POA: Diagnosis present

## 2015-12-20 DIAGNOSIS — Z809 Family history of malignant neoplasm, unspecified: Secondary | ICD-10-CM | POA: Diagnosis not present

## 2015-12-20 DIAGNOSIS — I10 Essential (primary) hypertension: Secondary | ICD-10-CM | POA: Insufficient documentation

## 2015-12-20 DIAGNOSIS — Z833 Family history of diabetes mellitus: Secondary | ICD-10-CM | POA: Diagnosis not present

## 2015-12-20 DIAGNOSIS — I471 Supraventricular tachycardia: Secondary | ICD-10-CM | POA: Insufficient documentation

## 2015-12-20 DIAGNOSIS — Z79899 Other long term (current) drug therapy: Secondary | ICD-10-CM

## 2015-12-20 DIAGNOSIS — E119 Type 2 diabetes mellitus without complications: Secondary | ICD-10-CM | POA: Diagnosis not present

## 2015-12-20 MED ORDER — HYDROCODONE-ACETAMINOPHEN 5-325 MG PO TABS: 1.0000 | ORAL_TABLET | Freq: Four times a day (QID) | ORAL | 0 refills | Status: DC | PRN

## 2015-12-20 NOTE — Progress Notes (Signed)
Subjective:    Patient ID: [name], female    DOB: 16-Apr-1960, 55 y.o.   MRN: [id number]  HPI: [name] is a 55 year old female who returns for follow up appointment and medication refill. She states her pain is located in her bilateral hips radiating into her bilateral lower extremities posteriorly. She rates her pain 0. Her current exercise regime is walking.   She's currently wearing a heart monitor due to tachycardia, cardiology following.   Pain Inventory Average Pain 8 Pain Right Now 0 My pain is sharp and aching  In the last 24 hours, has pain interfered with the following? General activity 7 Relation with others 7 Enjoyment of life 8 What TIME of day is your pain at its worst? daytime, night Sleep (in general) NA  Pain is worse with: walking, standing and some activites Pain improves with: rest, heat/ice and medication Relief from Meds: 7  Mobility ability to climb steps?  yes do you drive?  yes Do you have any goals in this area?  no  Function employed # of hrs/week 40 I need assistance with the following:  household duties and shopping  Neuro/Psych trouble walking  Prior Studies Any changes since last visit?  no  Physicians involved in your care Any changes since last visit?  no   Family History  Problem Relation Age of Onset  . Hypertension Mother   . Alzheimer's disease Mother   . Diabetes Father   . Cancer Sister   . Anesthesia problems Neg Hx   . Hypotension Neg Hx   . Malignant hyperthermia Neg Hx   . Pseudochol deficiency Neg Hx    Social History   Social History  . Marital status: Divorced    Spouse name: N/A  . Number of children: N/A  . Years of education: N/A   Social History Main Topics  . Smoking status: Never Smoker  . Smokeless tobacco: Never Used  . Alcohol use No  . Drug use: No  . Sexual activity: Yes   Other Topics Concern  . Not on file   Social History Narrative  . No narrative on file    Past Surgical History:  Procedure Laterality Date  . ANAL EXAMINATION UNDER ANESTHESIA  02/21/11   anal fistula  . BREAST SURGERY  patient does not remember date of procedure   pull fluid off lft br  . ELECTROPHYSIOLOGIC STUDY N/A 05/05/2015   Procedure: SVT Ablation;  Surgeon: Will Meredith Leeds, MD;  Location: [facility];  Service: Cardiovascular;  Laterality: N/A;  . INCISE AND DRAIN ABCESS     abscess on right thigh and buttock  . KNEE ARTHROSCOPY     left  . SHOULDER SURGERY  04/14/09   right   Past Medical History:  Diagnosis Date  . Abscess    increased drainage from abscess on buttock  . Anal fistula   . Anxiety   . Bilateral hip pain 05/27/2015  . Chronic pain syndrome 05/27/2015  . Depression    sees [name]  . Diabetes mellitus without complication ([place])   . Hypertension   . Sleep apnea    2008- sleep study, neg. for sleep apnea   . SVT (supraventricular tachycardia) (HCC)    There were no vitals taken for this visit.  Opioid Risk Score:   Fall Risk Score:  `1  Depression screen [facility] 2/9  Depression screen [facility] 2/9 11/28/2015 10/26/2015 05/12/2015 09/28/2014 08/23/2014 06/23/2014 06/03/2014  Decreased Interest 0  3 2 1  0 0 0  Down, Depressed, Hopeless 0 3 1 0 0 0 0  PHQ - 2 Score 0 6 3 1  0 0 0  Altered sleeping - 3 3 - - - -  Tired, decreased energy - 3 1 - - - -  Change in appetite - 3 1 - - - -  Feeling bad or failure about yourself  - 3 0 - - - -  Trouble concentrating - 3 0 - - - -  Moving slowly or fidgety/restless - 0 0 - - - -  Suicidal thoughts - 0 0 - - - -  PHQ-9 Score - 21 8 - - - -  Difficult doing work/chores - Very difficult Somewhat difficult - - - -     Review of Systems  Constitutional: Positive for diaphoresis.  Respiratory: Positive for shortness of breath.   Endocrine:       High blood sugar  Musculoskeletal: Positive for gait problem.  All other systems reviewed and are negative.      Objective:   Physical Exam   Constitutional: She is oriented to person, place, and time. She appears well-developed and well-nourished.  HENT:  Head: Normocephalic and atraumatic.  Neck: Normal range of motion. Neck supple.  Cardiovascular: Normal rate and regular rhythm.   Pulmonary/Chest: Effort normal and breath sounds normal.  Musculoskeletal:  Normal Muscle Bulk and Muscle testing Reveals: Upper Extremities: Full ROM and Muscle Strength 5/5 Bilateral Greater Trochanteric Tenderness L>R Lower Extremities: Full ROM and Muscle Strength 5/5 Arises from Table with ease Narrow Based Gait  Neurological: She is alert and oriented to person, place, and time.  Skin: Skin is warm and dry.  Psychiatric: She has a normal mood and affect.  Nursing note and vitals reviewed.         Assessment & Plan:  1.Chronic Bilateral Hip pain L>R---endstage OA of hips. : Awaiting hip replacements.Continue HEP as tolerated and Heat and Ice Therapy Refilled: Hydrocodone  5/325 one q6 hours as needed for moderate pain #120.  We will continue the opioid monitoring program, this consists of regular clinic visits, examinations, urine drug screen, pill counts as well as use of [place] Controlled Substance Reporting System.            -            - 3. Morbid obesity: Continue with healthy diet Regime and HEP . 20 minutes of face to face patient care time was spent during this visit. All questions were encouraged and answered.  F/U in 1 month .

## 2015-12-21 NOTE — Telephone Encounter (Signed)
Placed a call to Ms. Burt Ek, I spoke to  Dr. Naaman Plummer. He will allow [name] to be seen every 2 months. She was instructed her November appointment will be cancelled, also to call out office  about 7-10 days  before her November script will be due. We will print her a prescription. Message left for Ms. Angulo awaiting a return call.

## 2015-12-27 LAB — TOXASSURE SELECT,+ANTIDEPR,UR

## 2015-12-28 NOTE — Progress Notes (Signed)
Urine drug screen for this encounter is consistent for prescribed medication 

## 2016-01-02 ENCOUNTER — Telehealth: Payer: Self-pay

## 2016-01-02 ENCOUNTER — Other Ambulatory Visit: Payer: Self-pay | Admitting: Medical

## 2016-01-02 NOTE — Telephone Encounter (Signed)
Patient left message stating that her pain has increased due to weather change. Would like to know if her Norco could be increased from 5-325 mg to 10-325 mg. Please advise

## 2016-01-03 ENCOUNTER — Telehealth: Payer: Self-pay | Admitting: Registered Nurse

## 2016-01-03 DIAGNOSIS — M25552 Pain in left hip: Secondary | ICD-10-CM

## 2016-01-03 DIAGNOSIS — M25551 Pain in right hip: Secondary | ICD-10-CM

## 2016-01-03 NOTE — Telephone Encounter (Signed)
Spoke with [facility], receptionist spoke with radiologist and recommended bilateral hips MRI, ordered has been placed. Sent message to Dr. Posey Pronto regarding the above.

## 2016-01-03 NOTE — Telephone Encounter (Signed)
[name] return the call, I explained we don't increase analgesics based on weather. Encouraged to obtained the MRI so we can evaluate and treat her bilateral hip pain, she verbalizes understanding.

## 2016-01-03 NOTE — Telephone Encounter (Signed)
Return Ms. Uhrich call no answer. Left message to return the call.

## 2016-01-04 ENCOUNTER — Encounter: Payer: Self-pay | Admitting: Medical

## 2016-01-04 ENCOUNTER — Ambulatory Visit (INDEPENDENT_AMBULATORY_CARE_PROVIDER_SITE_OTHER): Payer: BC Managed Care – PPO | Admitting: Medical

## 2016-01-04 VITALS — BP 118/78 | HR 80 | Temp 98.2°F | Wt 253.2 lb

## 2016-01-04 DIAGNOSIS — R82998 Other abnormal findings in urine: Secondary | ICD-10-CM | POA: Insufficient documentation

## 2016-01-04 DIAGNOSIS — M545 Low back pain, unspecified: Secondary | ICD-10-CM | POA: Insufficient documentation

## 2016-01-04 DIAGNOSIS — R609 Edema, unspecified: Secondary | ICD-10-CM | POA: Diagnosis not present

## 2016-01-04 DIAGNOSIS — E1121 Type 2 diabetes mellitus with diabetic nephropathy: Secondary | ICD-10-CM | POA: Diagnosis not present

## 2016-01-04 DIAGNOSIS — R8299 Other abnormal findings in urine: Secondary | ICD-10-CM | POA: Diagnosis not present

## 2016-01-04 LAB — BASIC METABOLIC PANEL
BUN: 15 mg/dL (ref 7–25)
CALCIUM: 9.2 mg/dL (ref 8.6–10.4)
CO2: 27 mmol/L (ref 20–31)
Chloride: 101 mmol/L (ref 98–110)
Creat: 1.16 mg/dL — ABNORMAL HIGH (ref 0.50–1.05)
GLUCOSE: 219 mg/dL — AB (ref 65–99)
Potassium: 3.7 mmol/L (ref 3.5–5.3)
SODIUM: 137 mmol/L (ref 135–146)

## 2016-01-04 LAB — POCT URINALYSIS DIPSTICK
Bilirubin, UA: NEGATIVE
Blood, UA: NEGATIVE
KETONES UA: NEGATIVE
Leukocytes, UA: NEGATIVE
Nitrite, UA: NEGATIVE
PROTEIN UA: NEGATIVE
SPEC GRAV UA: 1.025
Urobilinogen, UA: 4
pH, UA: 5.5

## 2016-01-04 NOTE — Progress Notes (Signed)
Subjective:  [name] is a 55 y.o. female who presents for possible UTI. Chief Complaint  Patient presents with  . Urinary Tract Infection    low back pain and dark urine couple of days  swelling in legs, chills at night   Having low back pain in general, darker yellow urine x last 5 days.  No burning with urination, but wondering about kidney infection.  Having some swelling in both legs x 1 weeks.    Denies any diet changes, but was taken off some of her heart median/ BP medication per cardiology.  Cardiology also stopped diuretic temporarily then added this back.  No SOB, no chest pain, but due to some pain she was having , saw cardiology.   No blood in urine, but there is an odor.   Never had UTI.  No hx/o kidney infection, no hx/o renal stones .  No recent injury, fall, trauma.  No bowel issues.  She did gain weight in recent weeks.   Recently blood sugars stay at goal except on average 2 days per week will see numbers in the 200s.  Checks sugar twice daily.   Diet hasn't been good last 2 weeks, death in family, under stress, binging at times.  No other aggravating or relieving factors.    Diabetes - still using metformin 500mg  BID, lantus [id number] QHS, trulicity weekly  Taking metoprolol 50mg  BID, Lasix 40mg  daily. Not taking lisinopril currently, this was d/c last cardiology visit due to dizziness and elevated Cr.   No other c/o.  The following portions of the patient's history were reviewed and updated as appropriate: allergies, current medications, past family history, past medical history, past social history, past surgical history and problem list.  ROS Otherwise as in subjective above  Past Medical History:  Diagnosis Date  . Abscess    increased drainage from abscess on buttock  . Anal fistula   . Anxiety   . Bilateral hip pain 05/27/2015  . Chronic pain syndrome 05/27/2015  . Depression    sees [name]  . Diabetes mellitus without complication ([place])   .  Hypertension   . Sleep apnea    2008- sleep study, neg. for sleep apnea   . SVT (supraventricular tachycardia) ([place])    Current Outpatient Prescriptions on File Prior to Visit  Medication Sig Dispense Refill  . AMITIZA 24 MCG capsule TAKE ONE CAPSULE BY MOUTH TWICE DAILY WITH MEALS 60 capsule 2  . atorvastatin (LIPITOR) 20 MG tablet Take 1 tablet (20 mg total) by mouth at bedtime. 90 tablet 1  . citalopram (CELEXA) 20 MG tablet Take 1 tablet (20 mg total) by mouth daily. 30 tablet 2  . Dulaglutide (TRULICITY) A999333 0000000 SOPN Inject 0.75 mg into the skin once a week. 0.5 mL 2  . DULoxetine (CYMBALTA) 60 MG capsule Take 1 capsule (60 mg total) by mouth 2 (two) times daily. 60 capsule 2  . furosemide (LASIX) 40 MG tablet Take 0.5 tablets (20 mg total) by mouth daily. 90 tablet 2  . HYDROcodone-acetaminophen (NORCO/VICODIN) 5-325 MG tablet Take 1 tablet by mouth every 6 (six) hours as needed for moderate pain. 120 tablet 0  . Insulin Glargine (LANTUS SOLOSTAR) 100 UNIT/ML Solostar Pen Inject 20 Units into the skin daily at 10 pm. 15 mL 3  . metFORMIN (GLUCOPHAGE) 500 MG tablet Take 1 tablet (500 mg total) by mouth 2 (two) times daily with a meal. 180 tablet 3  . metoprolol (LOPRESSOR) 50 MG tablet Take 1  tablet (50 mg total) by mouth 2 (two) times daily. 60 tablet 9  . omeprazole (PRILOSEC) 20 MG capsule Take 1 capsule (20 mg total) by mouth daily. 30 capsule 3  . ferrous sulfate 325 (65 FE) MG tablet Take 1 tablet (325 mg total) by mouth 2 (two) times daily with a meal. (Patient not taking: Reported on 01/04/2016) 60 tablet 2   No current facility-administered medications on file prior to visit.     Objective: Physical Exam BP 118/78   Pulse 80   Temp 98.2 F (36.8 C) (Oral)   Wt 253 lb 3.2 oz (114.9 kg)   SpO2 97%   BMI 44.85 kg/m    Wt Readings from Last 3 Encounters:  01/04/16 253 lb 3.2 oz (114.9 kg)  12/07/15 245 lb 6.4 oz (111.3 kg)  11/09/15 239 lb 3.2 oz (108.5 kg)    General appearance: alert, no distress, WD/WN, obese female Oral cavity: MMM, no lesions Neck: supple, no lymphadenopathy, no thyromegaly, no masses Heart: RRR, normal S1, S2, no murmurs Lungs: CTA bilaterally, no wheezes, rhonchi, or rales Abdomen: +bs, soft, tender lower abdomen, otherwise non tender, non distended, no masses, no hepatomegaly, no splenomegaly Back: nontender, but mild pain with flexion and extension Pulses: 1+ radial pulses, 2+ pedal pulses, normal cap refill Ext: 1+ bilat LE edema, no asymmetry, no calve tenderness Skin: no rash   Assessment: Encounter Diagnoses  Name Primary?  . Acute bilateral low back pain without sciatica Yes  . Dark urine   . Edema, unspecified type   . Type 2 diabetes mellitus with diabetic nephropathy, without long-term current use of insulin (HCC)      Plan: Back pain, dark urine - UA reviewed.   Labs today, etiology unclear, but she has had some weight gain that could contribute to musculoskeletal back pain  Edema - c/t lasix, labs today  Diabetes- labs today, c/t current medications.    Of note lisinopril was stopped by cardiology due to elevated creatinine and dizziness.    Consider risks/benefits in regards to adding back due to diabetes.  She is UTD on flu shot per employer recently  [place] was seen today for urinary tract infection.  Diagnoses and all orders for this visit:  Acute bilateral low back pain without sciatica -     POCT urinalysis dipstick -     Basic metabolic panel  Dark urine  Edema, unspecified type -     Basic metabolic panel -     Brain natriuretic peptide  Type 2 diabetes mellitus with diabetic nephropathy, without long-term current use of insulin (HCC) -     Hemoglobin A1c  Follow up: pending labs

## 2016-01-05 ENCOUNTER — Ambulatory Visit (INDEPENDENT_AMBULATORY_CARE_PROVIDER_SITE_OTHER): Payer: BC Managed Care – PPO | Admitting: Cardiovascular Disease

## 2016-01-05 ENCOUNTER — Other Ambulatory Visit: Payer: Self-pay | Admitting: Medical

## 2016-01-05 ENCOUNTER — Encounter: Payer: Self-pay | Admitting: Cardiovascular Disease

## 2016-01-05 VITALS — BP 128/75 | HR 81 | Ht 63.0 in | Wt 253.4 lb

## 2016-01-05 DIAGNOSIS — R55 Syncope and collapse: Secondary | ICD-10-CM

## 2016-01-05 DIAGNOSIS — E1121 Type 2 diabetes mellitus with diabetic nephropathy: Secondary | ICD-10-CM | POA: Diagnosis not present

## 2016-01-05 DIAGNOSIS — I471 Supraventricular tachycardia: Secondary | ICD-10-CM

## 2016-01-05 DIAGNOSIS — R600 Localized edema: Secondary | ICD-10-CM

## 2016-01-05 LAB — HEMOGLOBIN A1C
HEMOGLOBIN A1C: 9.1 % — AB (ref <5.7)
Mean Plasma Glucose: 214 mg/dL

## 2016-01-05 LAB — BRAIN NATRIURETIC PEPTIDE: Brain Natriuretic Peptide: 17.2 pg/mL (ref <100)

## 2016-01-05 MED ORDER — INSULIN DEGLUDEC-LIRAGLUTIDE 100-3.6 UNIT-MG/ML ~~LOC~~ SOPN: 20.0000 [IU] | PEN_INJECTOR | Freq: Every day | SUBCUTANEOUS | 5 refills | Status: DC

## 2016-01-05 MED ORDER — FUROSEMIDE 40 MG PO TABS: 60.0000 mg | ORAL_TABLET | Freq: Every day | ORAL | 3 refills | Status: DC

## 2016-01-05 MED ORDER — METFORMIN HCL 850 MG PO TABS: 850.0000 mg | ORAL_TABLET | Freq: Two times a day (BID) | ORAL | 2 refills | Status: DC

## 2016-01-05 NOTE — Patient Instructions (Addendum)
Dr Sallyanne Kuster has recommended making the following medication changes: 1. INCREASE Furosemide to 60 mg daily  Your physician recommends that you schedule a follow-up appointment in 6 months. You will receive a reminder letter in the mail two months in advance. If you don't receive a letter, please call our office to schedule the follow-up appointment.  If you need a refill on your cardiac medications before your next appointment, please call your pharmacy.

## 2016-01-05 NOTE — Progress Notes (Signed)
Patient ID: [name], female   DOB: Aug 12, 1960, 55 y.o.   MRN: [id number] .    Cardiology Office Note    Date:  01/05/2016   ID:  [name], DOB May 20, 1960, MRN [id number]  PCP:  Crisoforo Oxford, PA-C  Cardiologist:   Sanda Klein, MD   Chief Complaint  Patient presents with  . Follow-up  . Edema    pt states both ankles and legs     History of Present Illness:  [name] is a 55 y.o. 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   About a month ago she saw Cecilie Kicks with complaints of worsening palpitations and 2 episodes of syncope. She had an episode of syncope carrying a laundry basket, and had another spell while on the toilet preceded by dizziness.  She has just completed a 30 day event monitor and during that time did not have either palpitations or syncope. Only normal rhythm was recorded.  Significant comorbid conditions include obstructive sleep apnea, treated hyperlipidemia and type 2 diabetes mellitus. She does not have known structural heart disease. Her previous open wound has healed completely. Her edema is well controlled on the current dose of furosemide on the which she takes only 5 days a week  She has had worsening leg edema (after her metoprolol dose was increased), persistent even after her furosemide dose was increased to 40 mg daily.  Past Medical History:  Diagnosis Date  . Abscess    increased drainage from abscess on buttock  . Anal fistula   . Anxiety   . Bilateral hip pain 05/27/2015  . Chronic pain syndrome 05/27/2015  . Depression    sees [name]  . Diabetes mellitus without complication ([facility])   . Hypertension   . Sleep apnea    2008- sleep study, neg. for sleep apnea   . SVT (supraventricular tachycardia) (HCC)     Past Surgical History:  Procedure Laterality Date  . ANAL EXAMINATION UNDER ANESTHESIA  02/21/11   anal fistula  . BREAST SURGERY   patient does not remember date of procedure   pull fluid off lft br  . ELECTROPHYSIOLOGIC STUDY N/A 05/05/2015   Procedure: SVT Ablation;  Surgeon: Will Meredith Leeds, MD;  Location: [facility];  Service: Cardiovascular;  Laterality: N/A;  . INCISE AND DRAIN ABCESS     abscess on right thigh and buttock  . KNEE ARTHROSCOPY     left  . SHOULDER SURGERY  04/14/09   right    Outpatient Medications Prior to Visit  Medication Sig Dispense Refill  . AMITIZA 24 MCG capsule TAKE ONE CAPSULE BY MOUTH TWICE DAILY WITH MEALS 60 capsule 2  . atorvastatin (LIPITOR) 20 MG tablet Take 1 tablet (20 mg total) by mouth at bedtime. 90 tablet 1  . citalopram (CELEXA) 20 MG tablet Take 1 tablet (20 mg total) by mouth daily. 30 tablet 2  . DULoxetine (CYMBALTA) 60 MG capsule Take 1 capsule (60 mg total) by mouth 2 (two) times daily. 60 capsule 2  . ferrous sulfate 325 (65 FE) MG tablet Take 1 tablet (325 mg total) by mouth 2 (two) times daily with a meal. 60 tablet 2  . HYDROcodone-acetaminophen (NORCO/VICODIN) 5-325 MG tablet Take 1 tablet by mouth every 6 (six) hours as needed for moderate pain. 120 tablet 0  . Insulin Degludec-Liraglutide (XULTOPHY) 100-3.6 UNIT-MG/ML SOPN Inject 20 Units into the skin daily. 3 mL 5  . metFORMIN (GLUCOPHAGE)  850 MG tablet Take 1 tablet (850 mg total) by mouth 2 (two) times daily with a meal. 60 tablet 2  . metoprolol (LOPRESSOR) 50 MG tablet Take 1 tablet (50 mg total) by mouth 2 (two) times daily. 60 tablet 9  . omeprazole (PRILOSEC) 20 MG capsule Take 1 capsule (20 mg total) by mouth daily. 30 capsule 3  . furosemide (LASIX) 40 MG tablet Take 0.5 tablets (20 mg total) by mouth daily. 90 tablet 2   No facility-administered medications prior to visit.      Allergies:   Tramadol   Social History   Social History  . Marital status: Divorced    Spouse name: N/A  . Number of children: N/A  . Years of education: N/A   Social History Main Topics  . Smoking  status: Never Smoker  . Smokeless tobacco: Never Used  . Alcohol use No  . Drug use: No  . Sexual activity: Yes   Other Topics Concern  . None   Social History Narrative  . None     Family History:  The patient's family history includes Alzheimer's disease in her mother; Cancer in her sister; Diabetes in her father; Hypertension in her mother.   ROS:   Please see the history of present illness.    ROS All other systems reviewed and are negative.   PHYSICAL EXAM:   VS:  BP 128/75   Pulse 81   Ht 5\' 3"  (1.6 m)   Wt 253 lb 6.4 oz (114.9 kg)   SpO2 96%   BMI 44.89 kg/m    GEN: Mildly obese, well developed, in no acute distress  HEENT: normal  Neck: no JVD, carotid bruits, or masses Cardiac: RRR; no murmurs, rubs, or gallops,no edema  Respiratory:  clear to auscultation bilaterally, normal work of breathing GI: soft, nontender, nondistended, + BS MS: no deformity or atrophy  Skin: warm and dry, no rash Neuro:  Alert and Oriented x 3, Strength and sensation are intact Psych: euthymic mood, full affect  Wt Readings from Last 3 Encounters:  01/05/16 253 lb 6.4 oz (114.9 kg)  01/04/16 253 lb 3.2 oz (114.9 kg)  12/07/15 245 lb 6.4 oz (111.3 kg)      Studies/Labs Reviewed:   EKG:  EKG is not ordered today.   Recent Labs: 08/23/2015: ALT 13 10/26/2015: Hemoglobin 11.8; Platelets 426 11/09/2015: TSH 0.46 01/04/2016: Brain Natriuretic Peptide 17.2; BUN 15; Creat 1.16; Potassium 3.7; Sodium 137   Lipid Panel    Component Value Date/Time   CHOL 175 12/07/2014 1534   TRIG 220.0 (H) 12/07/2014 1534   HDL 30.10 (L) 12/07/2014 1534   CHOLHDL 6 12/07/2014 1534   VLDL 44.0 (H) 12/07/2014 1534   LDLDIRECT 127.0 12/07/2014 1534    ASSESSMENT:    1. Vasovagal syncope   2. AVNRT (AV nodal re-entry tachycardia) ([place])   3. Morbid obesity ([place])   4. Type 2 diabetes mellitus with diabetic nephropathy, without long-term current use of insulin ([place])   5. Bilateral leg edema       PLAN:  In order of problems listed above:  1. Syncope: One of her episodes sounds consistent with a vasovagal event, the other not so clearly. No significant arrhythmia, either bradycardia or tachycardia has been recorded on her event monitor. If syncope occurs again I would strongly recommend an implantable loop recorder. Reviewed the purpose, pros and cons, potential complications of this device in detail with her today. Asked her to call us right away  if she has not a syncopal event 2. AV node reentry tachycardia, s/p RF ablation. 3. Obesity: Strongly encouraged to pursue weight loss to help with her diabetes and to lessen the likelihood of more complex atrial arrhythmias in the future 4. DM: control has deteriorated since earlier this year 83. Edema: We'll increase the dose of furosemide slightly    Medication Adjustments/Labs and Tests Ordered: Current medicines are reviewed at length with the patient today.  Concerns regarding medicines are outlined above.  Medication changes, Labs and Tests ordered today are listed in the Patient Instructions below. Patient Instructions  Dr Sallyanne Kuster has recommended making the following medication changes: 1. INCREASE Furosemide to 60 mg once daily  Your physician recommends that you schedule a follow-up appointment in 6 months. You will receive a reminder letter in the mail two months in advance. If you don't receive a letter, please call our office to schedule the follow-up appointment.  If you need a refill on your cardiac medications before your next appointment, please call your pharmacy. Signed, Sanda Klein, MD  01/05/2016 4:06 PM    [facility] [address], [place], [place]  [address] Phone: [phone]; Fax: [phone]

## 2016-01-06 ENCOUNTER — Telehealth: Payer: Self-pay

## 2016-01-06 NOTE — Telephone Encounter (Signed)
Pt states she has already seen nutritionist and does not want to go back. [name]

## 2016-01-09 ENCOUNTER — Telehealth: Payer: Self-pay | Admitting: Cardiovascular Disease

## 2016-01-09 MED ORDER — ATORVASTATIN CALCIUM 20 MG PO TABS: 20.0000 mg | ORAL_TABLET | Freq: Every day | ORAL | 1 refills | Status: DC

## 2016-01-09 MED ORDER — FUROSEMIDE 40 MG PO TABS: 60.0000 mg | ORAL_TABLET | Freq: Every day | ORAL | 3 refills | Status: DC

## 2016-01-09 MED ORDER — METOPROLOL TARTRATE 50 MG PO TABS: 50.0000 mg | ORAL_TABLET | Freq: Two times a day (BID) | ORAL | 3 refills | Status: DC

## 2016-01-09 NOTE — Telephone Encounter (Signed)
Rx(s) sent to pharmacy electronically.  

## 2016-01-09 NOTE — Telephone Encounter (Signed)
New emssage  Pt call requesting to speak with RN about medications that were to be sent to the pharmacy. Pt states pharmacy never receive request for medication. Pt does not know the names of medications. Please call back to discuss

## 2016-01-12 ENCOUNTER — Telehealth: Payer: Self-pay | Admitting: Medical

## 2016-01-12 NOTE — Telephone Encounter (Signed)
P.A. XULTOPHY °

## 2016-01-13 NOTE — Telephone Encounter (Signed)
Placed a call to [employer]/ State for Peer Review: I have received approval for Left MRI only. Authorization Code: [id number] this code expires 02/11/2016.

## 2016-01-15 ENCOUNTER — Telehealth: Payer: Self-pay | Admitting: Medical

## 2016-01-15 NOTE — Telephone Encounter (Signed)
P.A. XULTOPHY °

## 2016-01-16 ENCOUNTER — Emergency Department (HOSPITAL_COMMUNITY)
Admission: EM | Admit: 2016-01-16 | Discharge: 2016-01-16 | Disposition: A | Discharge status: Home / Self Care | Payer: BC Managed Care – PPO | Attending: Emergency Medicine | Admitting: Emergency Medicine

## 2016-01-16 ENCOUNTER — Encounter (HOSPITAL_COMMUNITY): Payer: Self-pay

## 2016-01-16 ENCOUNTER — Emergency Department (HOSPITAL_COMMUNITY): Payer: BC Managed Care – PPO

## 2016-01-16 DIAGNOSIS — E876 Hypokalemia: Secondary | ICD-10-CM | POA: Diagnosis not present

## 2016-01-16 DIAGNOSIS — I10 Essential (primary) hypertension: Secondary | ICD-10-CM | POA: Insufficient documentation

## 2016-01-16 DIAGNOSIS — Z7984 Long term (current) use of oral hypoglycemic drugs: Secondary | ICD-10-CM | POA: Diagnosis not present

## 2016-01-16 DIAGNOSIS — Z794 Long term (current) use of insulin: Secondary | ICD-10-CM | POA: Diagnosis not present

## 2016-01-16 DIAGNOSIS — R002 Palpitations: Secondary | ICD-10-CM | POA: Diagnosis present

## 2016-01-16 DIAGNOSIS — E119 Type 2 diabetes mellitus without complications: Secondary | ICD-10-CM | POA: Diagnosis not present

## 2016-01-16 DIAGNOSIS — Z79899 Other long term (current) drug therapy: Secondary | ICD-10-CM | POA: Diagnosis not present

## 2016-01-16 DIAGNOSIS — R0600 Dyspnea, unspecified: Secondary | ICD-10-CM | POA: Diagnosis not present

## 2016-01-16 LAB — I-STAT TROPONIN, ED
TROPONIN I, POC: 0 ng/mL (ref 0.00–0.08)
Troponin i, poc: 0 ng/mL (ref 0.00–0.08)

## 2016-01-16 LAB — BASIC METABOLIC PANEL
ANION GAP: 13 (ref 5–15)
BUN: 14 mg/dL (ref 6–20)
CHLORIDE: 97 mmol/L — AB (ref 101–111)
CO2: 25 mmol/L (ref 22–32)
Calcium: 9.5 mg/dL (ref 8.9–10.3)
Creatinine, Ser: 1.23 mg/dL — ABNORMAL HIGH (ref 0.44–1.00)
GFR calc non Af Amer: 48 mL/min — ABNORMAL LOW (ref >60)
GFR, EST AFRICAN AMERICAN: 56 mL/min — AB (ref >60)
Glucose, Bld: 247 mg/dL — ABNORMAL HIGH (ref 65–99)
Potassium: 3.1 mmol/L — ABNORMAL LOW (ref 3.5–5.1)
Sodium: 135 mmol/L (ref 135–145)

## 2016-01-16 LAB — BRAIN NATRIURETIC PEPTIDE: B NATRIURETIC PEPTIDE 5: 26.5 pg/mL (ref 0.0–100.0)

## 2016-01-16 LAB — CBC
HCT: 37.7 % (ref 36.0–46.0)
HEMOGLOBIN: 12 g/dL (ref 12.0–15.0)
MCH: 25.1 pg — AB (ref 26.0–34.0)
MCHC: 31.8 g/dL (ref 30.0–36.0)
MCV: 78.9 fL (ref 78.0–100.0)
Platelets: 516 10*3/uL — ABNORMAL HIGH (ref 150–400)
RBC: 4.78 MIL/uL (ref 3.87–5.11)
RDW: 13.6 % (ref 11.5–15.5)
WBC: 14.5 10*3/uL — ABNORMAL HIGH (ref 4.0–10.5)

## 2016-01-16 LAB — D-DIMER, QUANTITATIVE: D-Dimer, Quant: 0.44 ug/mL-FEU (ref 0.00–0.50)

## 2016-01-16 MED ORDER — POTASSIUM CHLORIDE CRYS ER 20 MEQ PO TBCR
40.0000 meq | EXTENDED_RELEASE_TABLET | Freq: Once | ORAL | Status: AC
  Administered 2016-01-16: 40 meq via ORAL
  Filled 2016-01-16: qty 2

## 2016-01-16 NOTE — ED Notes (Signed)
Reports feeling better.  Denies CP.  Requesting apple juice.  Repeat troponin drawn.

## 2016-01-16 NOTE — ED Triage Notes (Signed)
Pt reports shortness of breath since last night. Pt reports palpitations for several months, she wore a monitor with nothing showing. She reports she feels as though she is having palpitations again today. She also reports frequent sweating.

## 2016-01-16 NOTE — Discharge Instructions (Signed)
Blood Potassium today was 3.1 which is low. Please call Dr.Croitoru's office tomorrow to schedule appointment. Your blood potassium should be rechecked within the next 7-10 days.

## 2016-01-16 NOTE — ED Provider Notes (Signed)
[facility] DEPT Provider Note   CSN: [id number] Arrival date & time: 01/16/16  1119     History   Chief Complaint Chief Complaint  Patient presents with  . Palpitations  . Shortness of Breath    HPI [name] is a 55 y.o. female.Complains of feeling of heart racing onset 2 days ago intermittent. She was told by her cardiologist come to the emergency Department if her heart rate goes over 100. She noted Today's heart rate was noted to be 113 area she also reports dyspnea worse with exertion since April 2017., Unchanged She presently feels mildly dyspneic. He denies chest pain denies other associated symptoms. No nausea or sweatiness. Denies excessive caffeine use. A shot was evaluated by Dr.Croituru 01/05/2016 for worsening palpitations and syncope. It was recommended that she increase Lasix to 60 mg daily. Syncope was felt to be vasovagal in etiology  HPI  Past Medical History:  Diagnosis Date  . Abscess    increased drainage from abscess on buttock  . Anal fistula   . Anxiety   . Bilateral hip pain 05/27/2015  . Chronic pain syndrome 05/27/2015  . Depression    sees [name]  . Diabetes mellitus without complication ([place])   . Hypertension   . Sleep apnea    2008- sleep study, neg. for sleep apnea   . SVT (supraventricular tachycardia) [facility])     Patient Active Problem List   Diagnosis Date Noted  . Syncope 01/05/2016  . Acute bilateral low back pain without sciatica 01/04/2016  . Dark urine 01/04/2016  . Edema 01/04/2016  . Generalized anxiety disorder 10/26/2015  . Insomnia 10/26/2015  . History of multiple pulmonary nodules 10/26/2015  . Erythema intertrigo 09/21/2015  . Furuncle 09/21/2015  . Skin breakdown 09/21/2015  . Cutaneous abscess of chest wall 09/21/2015  . Bilateral hip joint arthritis 05/27/2015  . Abnormality of gait 05/27/2015  . Chronic pain syndrome 05/27/2015  . Yeast vaginitis 05/12/2015  . AVNRT (AV nodal re-entry tachycardia)  ([place]) 05/05/2015  . Fibroids 10/18/2014  . Lower abdominal pain 10/18/2014  . Low TSH level 09/17/2014  . Thyroid nodule 09/17/2014  . Bilateral leg edema 09/01/2014  . DOE (dyspnea on exertion) 09/01/2014  . Morbid obesity ([place]) 09/01/2014  . 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 DM type 2 (diabetes mellitus, type 2) ([place]) 05/20/2014  . Maceration of skin 10/27/2012  . Dehiscence of incision 10/27/2012  . Leukocytosis 10/27/2012  . Open wound 08/14/2011  . Anal fistula 01/19/2011  . Hidradenitis 10/17/2010    Past Surgical History:  Procedure Laterality Date  . ANAL EXAMINATION UNDER ANESTHESIA  02/21/11   anal fistula  . BREAST SURGERY  patient does not remember date of procedure   pull fluid off lft br  . ELECTROPHYSIOLOGIC STUDY N/A 05/05/2015   Procedure: SVT Ablation;  Surgeon: Will Meredith Leeds, MD;  Location: [facility];  Service: Cardiovascular;  Laterality: N/A;  . INCISE AND DRAIN ABCESS     abscess on right thigh and buttock  . KNEE ARTHROSCOPY     left  . SHOULDER SURGERY  04/14/09   right    OB History    Gravida Para Term Preterm AB Living   1 1   1        SAB TAB Ectopic Multiple Live Births                   Home Medications    Prior to Admission medications  Medication Sig Start Date End Date Taking? Authorizing Provider  AMITIZA 24 MCG capsule TAKE ONE CAPSULE BY MOUTH TWICE DAILY WITH MEALS Patient taking differently: TAKE 24 MCG BY MOUTH TWICE DAILY WITH MEALS 01/02/16  Yes Camelia Eng Tysinger, PA-C  atorvastatin (LIPITOR) 20 MG tablet Take 1 tablet (20 mg total) by mouth at bedtime. 01/09/16  Yes Mihai Croitoru, MD  citalopram (CELEXA) 20 MG tablet Take 1 tablet (20 mg total) by mouth daily. 10/27/15   Camelia Eng Tysinger, PA-C  DULoxetine (CYMBALTA) 60 MG capsule Take 1 capsule (60 mg total) by mouth 2 (two) times daily. 08/23/15   Camelia Eng Tysinger, PA-C  ferrous sulfate 325 (65 FE) MG tablet Take 1 tablet (325  mg total) by mouth 2 (two) times daily with a meal. 06/03/14   Lance Bosch, NP  furosemide (LASIX) 40 MG tablet Take 1.5 tablets (60 mg total) by mouth daily. 01/09/16   Mihai Croitoru, MD  HYDROcodone-acetaminophen (NORCO/VICODIN) 5-325 MG tablet Take 1 tablet by mouth every 6 (six) hours as needed for moderate pain. 12/20/15   Bayard Hugger, NP  Insulin Degludec-Liraglutide (XULTOPHY) 100-3.6 UNIT-MG/ML SOPN Inject 20 Units into the skin daily. 01/05/16   Camelia Eng Tysinger, PA-C  metFORMIN (GLUCOPHAGE) 850 MG tablet Take 1 tablet (850 mg total) by mouth 2 (two) times daily with a meal. 01/05/16   Camelia Eng Tysinger, PA-C  metoprolol (LOPRESSOR) 50 MG tablet Take 1 tablet (50 mg total) by mouth 2 (two) times daily. 01/09/16 04/08/16  Mihai Croitoru, MD  omeprazole (PRILOSEC) 20 MG capsule Take 1 capsule (20 mg total) by mouth daily. 08/23/14   Lance Bosch, NP    Family History Family History  Problem Relation Age of Onset  . Hypertension Mother   . Alzheimer's disease Mother   . Diabetes Father   . Cancer Sister   . Anesthesia problems Neg Hx   . Hypotension Neg Hx   . Malignant hyperthermia Neg Hx   . Pseudochol deficiency Neg Hx     Social History Social History  Substance Use Topics  . Smoking status: Never Smoker  . Smokeless tobacco: Never Used  . Alcohol use No     Allergies   Tramadol   Review of Systems Review of Systems  Constitutional: Negative.   HENT: Negative.   Respiratory: Positive for shortness of breath.   Cardiovascular: Positive for palpitations.  Gastrointestinal: Negative.   Musculoskeletal: Negative.   Skin: Negative.   Neurological: Negative.   Psychiatric/Behavioral: Negative.   All other systems reviewed and are negative.    Physical Exam Updated Vital Signs BP 126/68 (BP Location: Right Arm)   Pulse 100   Resp 20   SpO2 99%   Physical Exam  Constitutional: She appears well-developed and well-nourished.  HENT:  Head:  Normocephalic and atraumatic.  Eyes: Conjunctivae are normal. Pupils are equal, round, and reactive to light.  Neck: Neck supple. No tracheal deviation present. No thyromegaly present.  Cardiovascular: Normal rate and regular rhythm.   No murmur heard. Pulmonary/Chest: Effort normal and breath sounds normal.  Abdominal: Soft. Bowel sounds are normal. She exhibits no distension. There is no tenderness.  Musculoskeletal: Normal range of motion. She exhibits no edema or tenderness.  Neurological: She is alert. Coordination normal.  Skin: Skin is warm and dry. No rash noted.  Psychiatric: She has a normal mood and affect.  Nursing note and vitals reviewed.    ED Treatments / Results  Labs (all labs ordered are listed,  but only abnormal results are displayed) Labs Reviewed  BASIC METABOLIC PANEL - Abnormal; Notable for the following:       Result Value   Potassium 3.1 (*)    Chloride 97 (*)    Glucose, Bld 247 (*)    Creatinine, Ser 1.23 (*)    GFR calc non Af Amer 48 (*)    GFR calc Af Amer 56 (*)    All other components within normal limits  CBC - Abnormal; Notable for the following:    WBC 14.5 (*)    MCH 25.1 (*)    Platelets 516 (*)    All other components within normal limits  I-STAT TROPOININ, ED  Chest x-ray viewed by me. Results for orders placed or performed during the hospital encounter of 0000000  Basic metabolic panel  Result Value Ref Range   Sodium 135 135 - 145 mmol/L   Potassium 3.1 (L) 3.5 - 5.1 mmol/L   Chloride 97 (L) 101 - 111 mmol/L   CO2 25 22 - 32 mmol/L   Glucose, Bld 247 (H) 65 - 99 mg/dL   BUN 14 6 - 20 mg/dL   Creatinine, Ser 1.23 (H) 0.44 - 1.00 mg/dL   Calcium 9.5 8.9 - 10.3 mg/dL   GFR calc non Af Amer 48 (L) >60 mL/min   GFR calc Af Amer 56 (L) >60 mL/min   Anion gap 13 5 - 15  CBC  Result Value Ref Range   WBC 14.5 (H) 4.0 - 10.5 K/uL   RBC 4.78 3.87 - 5.11 MIL/uL   Hemoglobin 12.0 12.0 - 15.0 g/dL   HCT 37.7 36.0 - 46.0 %   MCV 78.9  78.0 - 100.0 fL   MCH 25.1 (L) 26.0 - 34.0 pg   MCHC 31.8 30.0 - 36.0 g/dL   RDW 13.6 11.5 - 15.5 %   Platelets 516 (H) 150 - 400 K/uL  D-dimer, quantitative (not at [facility])  Result Value Ref Range   D-Dimer, Quant 0.44 0.00 - 0.50 ug/mL-FEU  Brain natriuretic peptide  Result Value Ref Range   B Natriuretic Peptide 26.5 0.0 - 100.0 pg/mL  I-stat troponin, ED  Result Value Ref Range   Troponin i, poc 0.00 0.00 - 0.08 ng/mL   Comment 3          I-stat troponin, ED  Result Value Ref Range   Troponin i, poc 0.00 0.00 - 0.08 ng/mL   Comment 3           Dg Chest 2 View  Result Date: 01/16/2016 CLINICAL DATA:  Palpitations since this morning.  Dizziness. EXAM: CHEST  2 VIEW COMPARISON:  10/26/2015. FINDINGS: Normal sized heart. Clear lungs. Small to moderate-sized hiatal hernia. Unremarkable bones. IMPRESSION: No acute abnormality.  Small to moderate-sized hiatal hernia. Electronically Signed   By: Claudie Revering M.D.   On: 01/16/2016 12:29    EKG  EKG Interpretation  Date/Time:  Monday January 16 2016 11:28:06 EST Ventricular Rate:  101 PR Interval:  114 QRS Duration: 90 QT Interval:  356 QTC Calculation: 461 R Axis:   50 Text Interpretation:  Sinus tachycardia Otherwise normal ECG SINCE LAST TRACING HEART RATE HAS INCREASED Confirmed by Winfred Leeds  MD, Jabriel Vanduyne [phone]) on 01/16/2016 12:24:19 PM       Radiology No results found.  Procedures Procedures (including critical care time)  Medications Ordered in ED Medications - No data to display Chest x-ray viewed by me  Initial Impression / Assessment and Plan / ED Course  I have reviewed the triage vital signs and the nursing notes.  Pertinent labs & imaging results that were available during my care of the patient were reviewed by me and considered in my medical decision making (see chart for details).  Clinical Course    4:30 PM patient asymptomatic. Her potassium was repleted while here. No evidence of acute coronary  syndrome, pulmonary [place] congestive heart failure. Plan follow-up with Dr.Croitoru as outpatient Final Clinical Impressions(s) / ED Diagnoses  Diagnosis #1 palpitations #2 dyspnea #3 hypokalemia Final diagnoses:  None    New Prescriptions New Prescriptions   No medications on file     Orlie Dakin, MD 01/16/16 1654

## 2016-01-16 NOTE — ED Notes (Signed)
Patient given apple juice

## 2016-01-17 NOTE — Telephone Encounter (Signed)
Additional questions answered submitted for P.A.  

## 2016-01-18 NOTE — Telephone Encounter (Signed)
P.A. Approved til 01/17/17, faxed pharmacy & pt informed

## 2016-01-19 ENCOUNTER — Telehealth: Payer: Self-pay | Admitting: Cardiovascular Disease

## 2016-01-19 ENCOUNTER — Other Ambulatory Visit: Payer: Self-pay | Admitting: Medical

## 2016-01-19 NOTE — Telephone Encounter (Signed)
°*  STAT* If patient is at the pharmacy, call can be transferred to refill team.   1. Which medications need to be refilled? (please list name of each medication and dose if known) Metoprolol 50mg  ( Needs a new prescription sent showing that she is to take the medication 3 times a day)    2. Which pharmacy/location (including street and city if local pharmacy) is medication to be sent to?[facility] on [address]   3. Do they need a 30 day or 90 day supply? [place]

## 2016-01-19 NOTE — Telephone Encounter (Deleted)
Can she have this

## 2016-01-21 ENCOUNTER — Ambulatory Visit (HOSPITAL_COMMUNITY): Payer: BC Managed Care – PPO

## 2016-01-21 ENCOUNTER — Ambulatory Visit (HOSPITAL_COMMUNITY)
Admission: RE | Admit: 2016-01-21 | Discharge: 2016-01-21 | Disposition: A | Discharge status: Home / Self Care | Payer: BC Managed Care – PPO | Source: Ambulatory Visit | Attending: Registered Nurse | Admitting: Registered Nurse

## 2016-01-21 DIAGNOSIS — M1612 Unilateral primary osteoarthritis, left hip: Secondary | ICD-10-CM | POA: Diagnosis not present

## 2016-01-21 DIAGNOSIS — M1611 Unilateral primary osteoarthritis, right hip: Secondary | ICD-10-CM | POA: Insufficient documentation

## 2016-01-21 DIAGNOSIS — M25552 Pain in left hip: Secondary | ICD-10-CM | POA: Diagnosis present

## 2016-01-22 ENCOUNTER — Telehealth: Payer: Self-pay | Admitting: Cardiology

## 2016-01-22 NOTE — Telephone Encounter (Addendum)
Patent called saying that she is having palpitations and went to the ER last week and nothing was done.  Now says that her heart rate is 105bpm. She says that her heart is irregular.  Instructed her to go to ER for EKG as her palpitations have thus far not been caught on a heart monitor

## 2016-01-23 MED ORDER — METOPROLOL TARTRATE 50 MG PO TABS: 50.0000 mg | ORAL_TABLET | Freq: Three times a day (TID) | ORAL | 1 refills | Status: DC

## 2016-01-23 NOTE — Telephone Encounter (Signed)
Called patient to let her know her prescription for Metoprolol Tartrate ha sbeen sent to her local pharmacy as 50mg , 3 times daily per Dr. Sallyanne Kuster. The patient said thank you and would like Dr. Sallyanne Kuster to know her palpitations are back and they scare her. She was recently in the ER because of them. She states she feels them even when walking from her car to in the house. She would like a call back to know what to do. I will route this message to both Dr. Sallyanne Kuster and [facility] Triage to further advise.

## 2016-01-23 NOTE — Addendum Note (Signed)
Addended by: Marlis Edelson C on: 01/23/2016 02:53 PM   Modules accepted: Orders

## 2016-01-23 NOTE — Telephone Encounter (Signed)
Metoprolol increased to 3 times daily after her ED evaluation. Please send new Rx.

## 2016-01-25 ENCOUNTER — Other Ambulatory Visit: Payer: Self-pay

## 2016-01-25 ENCOUNTER — Telehealth: Payer: Self-pay

## 2016-01-25 ENCOUNTER — Ambulatory Visit: Payer: BC Managed Care – PPO | Admitting: Registered Nurse

## 2016-01-25 DIAGNOSIS — M25552 Pain in left hip: Principal | ICD-10-CM

## 2016-01-25 DIAGNOSIS — M25551 Pain in right hip: Secondary | ICD-10-CM

## 2016-01-25 DIAGNOSIS — R0609 Other forms of dyspnea: Secondary | ICD-10-CM

## 2016-01-25 DIAGNOSIS — R002 Palpitations: Secondary | ICD-10-CM

## 2016-01-25 NOTE — Telephone Encounter (Signed)
Called patient to notify her of recommendations.  Patient verbalized understanding and agreed with plan. Patient reports that these episodes have continued upon discharge from ER. Reports nausea-on and off and increased heart rate occurring roughly every 3-4 days. Today, she "feels" her increased heart rate/palpitations. Also states that she called and spoke with the doctor on call on Sunday. She was instructed to present to ER for an EKG - no EKG or ER visit on file.  Advised that she have blood work completed. Patient agreed.

## 2016-01-25 NOTE — Telephone Encounter (Signed)
lmtcb

## 2016-01-25 NOTE — Telephone Encounter (Signed)
Patient would like a refill on Norco. Please advise

## 2016-01-25 NOTE — Telephone Encounter (Signed)
Patient returned call. Patient agreed to having an implantable loop recorder. Patient prefers to have procedure next week on Monday. Will work on getting this scheduled tomorrow. Patient verbalized understanding and agreed with plan.

## 2016-01-25 NOTE — Telephone Encounter (Signed)
-----   Message from Sanda Klein, MD sent at 01/16/2016  5:37 PM EST ----- Thanks, Sam! I'll take care of it. Mihai \ [place], can you please order that BMET?  ----- Message ----- From: Orlie Dakin, MD Sent: 01/16/2016   4:56 PM To: Sanda Klein, MD  Hello Mihai: Mrs. Kopinski came to the emergency department today stating that her heart was racing and she counted her heart rate to be 113. She also complained of dyspnea on exertion which was unchanged from April 2017. I performed serial troponins, d-dimer and BNP which were all normal. Chest x-ray Her potassium was 3.1. She received 40 of potassium orally while here. I asked her to call your office for follow-up as you had seen her recently. Her serum potassium should be rechecked within a week or so. Thanks so much, Sam [employer]

## 2016-01-25 NOTE — Telephone Encounter (Signed)
Please remind her that I recommended that she consider an implantable loop recorder. If she agrees, I can do this week.

## 2016-01-25 NOTE — Telephone Encounter (Signed)
She is due for refill. I am ok with it. She should have an appt for next narc refill however.

## 2016-01-26 ENCOUNTER — Encounter: Payer: Self-pay | Admitting: Registered Nurse

## 2016-01-26 ENCOUNTER — Telehealth: Payer: Self-pay

## 2016-01-26 ENCOUNTER — Other Ambulatory Visit: Payer: Self-pay

## 2016-01-26 ENCOUNTER — Encounter: Payer: Self-pay | Admitting: Cardiovascular Disease

## 2016-01-26 DIAGNOSIS — M25552 Pain in left hip: Principal | ICD-10-CM

## 2016-01-26 DIAGNOSIS — M25551 Pain in right hip: Secondary | ICD-10-CM

## 2016-01-26 MED ORDER — HYDROCODONE-ACETAMINOPHEN 5-325 MG PO TABS: 1.0000 | ORAL_TABLET | Freq: Four times a day (QID) | ORAL | 0 refills | Status: DC | PRN

## 2016-01-26 NOTE — Telephone Encounter (Signed)
Patient returned call. Advised patient of procedure date and time, arrival time, and instructions. Patient needs to wash with surgical scrub prior to procedure. Procedure instructions, CHG & washing instructions, and a work excuse letter provided for patient. Letters and soap left at front desk for patient pick up. Patient verbalized understanding and agreed with plan.

## 2016-01-26 NOTE — Telephone Encounter (Signed)
Patient scheduled for a loop recorder implant on 01/30/16 at 1p. lmtcb

## 2016-01-26 NOTE — Telephone Encounter (Signed)
Left message for [place] that Rx is ready for pickup at our office and must keep upcoming appointment with Dr Naaman Plummer on 12/13.

## 2016-01-26 NOTE — Telephone Encounter (Signed)
Reviewed NCCSR: Last Hydrocodone was picked up on 12/28/2015.  Hydrocodone prescription printed, her next appointment 02/22/16 with Dr. Naaman Plummer.  She will be called with the above instructions.

## 2016-01-27 NOTE — Telephone Encounter (Signed)
Spoke with Ms. Hanselman regarding her MRI, she verbalizes understanding.

## 2016-01-29 ENCOUNTER — Emergency Department (HOSPITAL_COMMUNITY): Payer: BC Managed Care – PPO

## 2016-01-29 ENCOUNTER — Encounter (HOSPITAL_COMMUNITY): Payer: Self-pay

## 2016-01-29 ENCOUNTER — Emergency Department (HOSPITAL_COMMUNITY)
Admission: EM | Admit: 2016-01-29 | Discharge: 2016-01-29 | Disposition: A | Discharge status: Home / Self Care | Payer: BC Managed Care – PPO | Attending: Emergency Medicine | Admitting: Emergency Medicine

## 2016-01-29 DIAGNOSIS — Y929 Unspecified place or not applicable: Secondary | ICD-10-CM | POA: Diagnosis not present

## 2016-01-29 DIAGNOSIS — E119 Type 2 diabetes mellitus without complications: Secondary | ICD-10-CM | POA: Diagnosis not present

## 2016-01-29 DIAGNOSIS — Z794 Long term (current) use of insulin: Secondary | ICD-10-CM | POA: Insufficient documentation

## 2016-01-29 DIAGNOSIS — R002 Palpitations: Secondary | ICD-10-CM

## 2016-01-29 DIAGNOSIS — Y939 Activity, unspecified: Secondary | ICD-10-CM | POA: Diagnosis not present

## 2016-01-29 DIAGNOSIS — Y999 Unspecified external cause status: Secondary | ICD-10-CM | POA: Diagnosis not present

## 2016-01-29 DIAGNOSIS — I10 Essential (primary) hypertension: Secondary | ICD-10-CM | POA: Insufficient documentation

## 2016-01-29 DIAGNOSIS — S161XXA Strain of muscle, fascia and tendon at neck level, initial encounter: Secondary | ICD-10-CM

## 2016-01-29 DIAGNOSIS — W19XXXA Unspecified fall, initial encounter: Secondary | ICD-10-CM | POA: Diagnosis not present

## 2016-01-29 DIAGNOSIS — S199XXA Unspecified injury of neck, initial encounter: Secondary | ICD-10-CM | POA: Diagnosis present

## 2016-01-29 LAB — I-STAT TROPONIN, ED: Troponin i, poc: 0 ng/mL (ref 0.00–0.08)

## 2016-01-29 LAB — CBC
HCT: 34.2 % — ABNORMAL LOW (ref 36.0–46.0)
Hemoglobin: 10.8 g/dL — ABNORMAL LOW (ref 12.0–15.0)
MCH: 25 pg — AB (ref 26.0–34.0)
MCHC: 31.6 g/dL (ref 30.0–36.0)
MCV: 79.2 fL (ref 78.0–100.0)
PLATELETS: 368 10*3/uL (ref 150–400)
RBC: 4.32 MIL/uL (ref 3.87–5.11)
RDW: 13.3 % (ref 11.5–15.5)
WBC: 13.4 10*3/uL — ABNORMAL HIGH (ref 4.0–10.5)

## 2016-01-29 LAB — BASIC METABOLIC PANEL
Anion gap: 12 (ref 5–15)
BUN: 10 mg/dL (ref 6–20)
CHLORIDE: 99 mmol/L — AB (ref 101–111)
CO2: 24 mmol/L (ref 22–32)
CREATININE: 1.13 mg/dL — AB (ref 0.44–1.00)
Calcium: 9.2 mg/dL (ref 8.9–10.3)
GFR calc Af Amer: >60 mL/min (ref >60)
GFR calc non Af Amer: 54 mL/min — ABNORMAL LOW (ref >60)
GLUCOSE: 362 mg/dL — AB (ref 65–99)
Potassium: 3.4 mmol/L — ABNORMAL LOW (ref 3.5–5.1)
SODIUM: 135 mmol/L (ref 135–145)

## 2016-01-29 MED ORDER — HYDROCODONE-ACETAMINOPHEN 5-325 MG PO TABS
1.0000 | ORAL_TABLET | Freq: Once | ORAL | Status: AC
  Administered 2016-01-29: 2 via ORAL
  Filled 2016-01-29: qty 2

## 2016-01-29 NOTE — ED Notes (Signed)
Pt fell today, states she has h/s of palpitations and her BP drops during these episodes when she fell. Denies dizziness, pt c/o pain in L leg, L shoulder, and neck.

## 2016-01-29 NOTE — ED Notes (Signed)
Patient transported to X-ray 

## 2016-01-29 NOTE — ED Triage Notes (Signed)
Onset today pt got dizzy, heart palpitations and fell hitting back of head, upper back, and left knee on sofa.  No c/o dizziness or heart palpitations at this time.  Pt reports she is getting a loop tomorrow to evaluate increased heart rate with drop in blood pressure and the dizziness.

## 2016-01-29 NOTE — ED Provider Notes (Addendum)
[facility] DEPT Provider Note   CSN: [id number] Arrival date & time: 01/29/16  1808     History   Chief Complaint Chief Complaint  Patient presents with  . Fall  . Dizziness    HPI [name] is a 55 y.o. female.  Patient with SVT, depression, diabetes, high blood pressure history presents after fall and palpitations. Patient's had intermittent palpitations multiple times in the past is following closely with cardiology for this. Patient had episode palpitations and had general weakness without syncope. Patient denies any chest pain or shortness of breath, no recent surgery no blood clot history, no known cardiac disorders. Patient currently feels well except for has neck left shoulder and left knee pain from the fall. No neurologic symptoms.      Past Medical History:  Diagnosis Date  . Abscess    increased drainage from abscess on buttock  . Anal fistula   . Anxiety   . Bilateral hip pain 05/27/2015  . Chronic pain syndrome 05/27/2015  . Depression    sees [name]  . Diabetes mellitus without complication ([place])   . Hypertension   . Sleep apnea    2008- sleep study, neg. for sleep apnea   . SVT (supraventricular tachycardia) [facility])     Patient Active Problem List   Diagnosis Date Noted  . Syncope 01/05/2016  . Acute bilateral low back pain without sciatica 01/04/2016  . Dark urine 01/04/2016  . Edema 01/04/2016  . Generalized anxiety disorder 10/26/2015  . Insomnia 10/26/2015  . History of multiple pulmonary nodules 10/26/2015  . Erythema intertrigo 09/21/2015  . Furuncle 09/21/2015  . Skin breakdown 09/21/2015  . Cutaneous abscess of chest wall 09/21/2015  . Bilateral hip joint arthritis 05/27/2015  . Abnormality of gait 05/27/2015  . Chronic pain syndrome 05/27/2015  . Yeast vaginitis 05/12/2015  . 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 Fibroids 10/18/2014  . Lower abdominal pain 10/18/2014  . Low TSH level 09/17/2014    . Thyroid nodule 09/17/2014  . Bilateral leg edema 09/01/2014  . DOE (dyspnea on exertion) 09/01/2014  . Morbid obesity ([place]) 09/01/2014  . 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 DM type 2 (diabetes mellitus, type 2) ([place]) 05/20/2014  . Maceration of skin 10/27/2012  . Dehiscence of incision 10/27/2012  . Leukocytosis 10/27/2012  . Open wound 08/14/2011  . Anal fistula 01/19/2011  . Hidradenitis 10/17/2010    Past Surgical History:  Procedure Laterality Date  . ANAL EXAMINATION UNDER ANESTHESIA  02/21/11   anal fistula  . BREAST SURGERY  patient does not remember date of procedure   pull fluid off lft br  . ELECTROPHYSIOLOGIC STUDY N/A 05/05/2015   Procedure: SVT Ablation;  Surgeon: Will Meredith Leeds, MD;  Location: [facility];  Service: Cardiovascular;  Laterality: N/A;  . INCISE AND DRAIN ABCESS     abscess on right thigh and buttock  . KNEE ARTHROSCOPY     left  . SHOULDER SURGERY  04/14/09   right    OB History    Gravida Para Term Preterm AB Living   1 1   1        SAB TAB Ectopic Multiple Live Births                   Home Medications    Prior to Admission medications   Medication Sig Start Date End Date Taking? Authorizing Provider  acetaminophen (TYLENOL) 500 MG tablet Take 1,000 mg by  mouth every 6 (six) hours as needed for moderate pain or headache.    Historical Provider, MD  AMITIZA 24 MCG capsule TAKE ONE CAPSULE BY MOUTH TWICE DAILY WITH MEALS Patient taking differently: TAKE 24 MCG BY MOUTH TWICE DAILY WITH MEALS 01/02/16   Camelia Eng Tysinger, PA-C  atorvastatin (LIPITOR) 20 MG tablet Take 1 tablet (20 mg total) by mouth at bedtime. 01/09/16   Mihai Croitoru, MD  citalopram (CELEXA) 20 MG tablet TAKE ONE TABLET BY MOUTH ONCE DAILY 01/19/16   Camelia Eng Tysinger, PA-C  DULoxetine (CYMBALTA) 60 MG capsule Take 1 capsule (60 mg total) by mouth 2 (two) times daily. 08/23/15   Camelia Eng Tysinger, PA-C  ferrous sulfate 325 (65  FE) MG tablet Take 1 tablet (325 mg total) by mouth 2 (two) times daily with a meal. Patient not taking: Reported on 01/16/2016 06/03/14   Lance Bosch, NP  furosemide (LASIX) 40 MG tablet Take 1.5 tablets (60 mg total) by mouth daily. 01/09/16   Mihai Croitoru, MD  HYDROcodone-acetaminophen (NORCO/VICODIN) 5-325 MG tablet Take 1 tablet by mouth every 6 (six) hours as needed for moderate pain. 01/26/16   Bayard Hugger, NP  Insulin Degludec-Liraglutide (XULTOPHY) 100-3.6 UNIT-MG/ML SOPN Inject 20 Units into the skin daily. Patient not taking: Reported on 01/16/2016 01/05/16   Camelia Eng Tysinger, PA-C  metFORMIN (GLUCOPHAGE) 850 MG tablet Take 1 tablet (850 mg total) by mouth 2 (two) times daily with a meal. 01/05/16   Camelia Eng Tysinger, PA-C  metoprolol (LOPRESSOR) 50 MG tablet Take 1 tablet (50 mg total) by mouth 3 (three) times daily. 01/23/16   Mihai Croitoru, MD  omeprazole (PRILOSEC) 20 MG capsule Take 1 capsule (20 mg total) by mouth daily. 08/23/14   Lance Bosch, NP  TRULICITY [id number] SOPN Inject 0.75 mg into the skin every Wednesday. 12/09/15   Historical Provider, MD    Family History Family History  Problem Relation Age of Onset  . Hypertension Mother   . Alzheimer's disease Mother   . Diabetes Father   . Cancer Sister   . Anesthesia problems Neg Hx   . Hypotension Neg Hx   . Malignant hyperthermia Neg Hx   . Pseudochol deficiency Neg Hx     Social History Social History  Substance Use Topics  . Smoking status: Never Smoker  . Smokeless tobacco: Never Used  . Alcohol use No     Allergies   Tramadol   Review of Systems Review of Systems  Constitutional: Negative for chills and fever.  HENT: Negative for ear pain and sore throat.   Eyes: Negative for pain and visual disturbance.  Respiratory: Negative for cough and shortness of breath.   Cardiovascular: Positive for palpitations. Negative for chest pain.  Gastrointestinal: Negative for abdominal pain and  vomiting.  Genitourinary: Negative for dysuria and hematuria.  Musculoskeletal: Positive for arthralgias and neck pain. Negative for back pain.  Skin: Negative for color change and rash.  Neurological: Positive for dizziness. Negative for seizures and syncope.  All other systems reviewed and are negative.    Physical Exam Updated Vital Signs BP 118/70   Pulse 79   Temp 98.4 F (36.9 C) (Oral)   Resp 21   LMP 09/15/2014   SpO2 100%   Physical Exam  Constitutional: She appears well-developed and well-nourished. No distress.  HENT:  Head: Normocephalic and atraumatic.  Eyes: Conjunctivae are normal.  Neck: Neck supple.  Cardiovascular: Normal rate, regular rhythm and intact distal pulses.  No murmur heard. Pulmonary/Chest: Effort normal and breath sounds normal. No respiratory distress.  Abdominal: Soft. There is no tenderness.  Musculoskeletal: She exhibits no edema.  Patient has tenderness left before meals joint no effusion for range of motion with mild discomfort. Patient is mild tenderness lower cervical midline and left trapezius. Patient has mild tenderness anterior left knee without significant effusion pain with flexion. No other major joint tenderness. No midline lumbar or thoracic spine tenderness.  Neurological: She is alert. She has normal strength. No cranial nerve deficit or sensory deficit.  Skin: Skin is warm and dry.  Psychiatric: She has a normal mood and affect.  Nursing note and vitals reviewed.    ED Treatments / Results  Labs (all labs ordered are listed, but only abnormal results are displayed) Labs Reviewed  BASIC METABOLIC PANEL - Abnormal; Notable for the following:       Result Value   Potassium 3.4 (*)    Chloride 99 (*)    Glucose, Bld 362 (*)    Creatinine, Ser 1.13 (*)    GFR calc non Af Amer 54 (*)    All other components within normal limits  CBC - Abnormal; Notable for the following:    WBC 13.4 (*)    Hemoglobin 10.8 (*)    HCT  34.2 (*)    MCH 25.0 (*)    All other components within normal limits  I-STAT TROPOININ, ED    EKG  EKG Interpretation  Date/Time:  Sunday January 29 2016 18:30:21 EST Ventricular Rate:  90 PR Interval:  134 QRS Duration: 88 QT Interval:  364 QTC Calculation: 445 R Axis:   32 Text Interpretation:  Normal sinus rhythm Abnormal ECG Confirmed by Reather Converse MD, [place] Streat [phone]) on 01/29/2016 8:22:21 PM       Radiology Dg Chest 2 View  Result Date: 01/29/2016 CLINICAL DATA:  Dizziness with cardiac palpitations and fall EXAM: CHEST  2 VIEW COMPARISON:  January 16, 2016 FINDINGS: There is slight atelectasis in the left base. The lungs elsewhere are clear. Heart size and pulmonary vascularity are normal. No adenopathy. There is a hiatal hernia. No bone lesions. IMPRESSION: Hiatal hernia. Slight atelectasis left base. No edema or consolidation. Electronically Signed   By: Lowella Grip III M.D.   On: 01/29/2016 19:07   Ct Cervical Spine Wo Contrast  Result Date: 01/29/2016 CLINICAL DATA:  Patient fell to the left side, striking the neck. Pain down the left side and the shoulder. EXAM: CT CERVICAL SPINE WITHOUT CONTRAST TECHNIQUE: Multidetector CT imaging of the cervical spine was performed without intravenous contrast. Multiplanar CT image reconstructions were also generated. COMPARISON:  Cervical spine radiographs 11/01/2007 FINDINGS: Alignment: Normal. Skull base and vertebrae: No acute fracture. No primary bone lesion or focal pathologic process. Soft tissues and spinal canal: No prevertebral fluid or swelling. No visible canal hematoma. Disc levels: Degenerative changes with mild disc space narrowing and endplate hypertrophic changes most prominent at C4-5, C5-6, and C6-7 levels. Upper chest: Prominent nodular enlargement of the thyroid gland. This has previously been evaluated by ultrasound. See report from 03/18/2015 Other: None. IMPRESSION: Mild degenerative changes in the cervical spine.  Normal alignment. No acute displaced fractures are identified. Incidental note of diffuse nodular enlargement of the thyroid gland. Electronically Signed   By: Lucienne Capers M.D.   On: 01/29/2016 21:31   Dg Shoulder Left  Result Date: 01/29/2016 CLINICAL DATA:  Left shoulder pain with abduction after a fall today. EXAM: LEFT SHOULDER - 2+ VIEW  COMPARISON:  None. FINDINGS: There is no evidence of fracture or dislocation. There is no evidence of arthropathy or other focal bone abnormality. Soft tissues are unremarkable. IMPRESSION: Negative. Electronically Signed   By: Lucienne Capers M.D.   On: 01/29/2016 21:47   Dg Knee Complete 4 Views Left  Result Date: 01/29/2016 CLINICAL DATA:  Left anterior knee pain with weight-bearing after a fall today. EXAM: LEFT KNEE - COMPLETE 4+ VIEW COMPARISON:  Left tib-fib 10/27/2012 FINDINGS: Degenerative changes in the left knee with medial greater than lateral compartment narrowing and mild to moderate osteophyte formation in all 3 compartments. Old osseous chip over the medial femoral condyle is unchanged since previous study. No evidence of acute fracture or dislocation. No focal bone lesion or bone destruction. No significant effusion. Soft tissues are unremarkable. IMPRESSION: Degenerative changes in the left knee.  No acute bony abnormalities. Electronically Signed   By: Lucienne Capers M.D.   On: 01/29/2016 21:39    Procedures Procedures (including critical care time)  Medications Ordered in ED Medications  HYDROcodone-acetaminophen (NORCO/VICODIN) 5-325 MG per tablet 1-2 tablet (2 tablets Oral Given 01/29/16 2141)     Initial Impression / Assessment and Plan / ED Course  I have reviewed the triage vital signs and the nursing notes.  Pertinent labs & imaging results that were available during my care of the patient were reviewed by me and considered in my medical decision making (see chart for details).  Clinical Course    Patient presents  after a fall secondary to palpitations and general weakness that has resolved. X-rays and CT scans of injured areas no fractures. Patient has normal neuro exam. Patient has close follow up with cardiology for these palpitations  Results and differential diagnosis were discussed with the patient/parent/guardian. Xrays were independently reviewed by myself.  Close follow up outpatient was discussed, comfortable with the plan.   Medications  HYDROcodone-acetaminophen (NORCO/VICODIN) 5-325 MG per tablet 1-2 tablet (2 tablets Oral Given 01/29/16 2141)    Vitals:   01/29/16 2000 01/29/16 2030 01/29/16 2100 01/29/16 2143  BP: 120/80 111/65 126/71 118/70  Pulse: 78 75 83 79  Resp: 18 20 21    Temp:      TempSrc:      SpO2: 97% 98% 99% 100%    Final diagnoses:  Fall, initial encounter  Palpitations  Cervical strain, acute, initial encounter     Final Clinical Impressions(s) / ED Diagnoses   Final diagnoses:  Fall, initial encounter  Palpitations  Cervical strain, acute, initial encounter    New Prescriptions New Prescriptions   No medications on file     Elnora Morrison, MD 01/29/16 2153    Elnora Morrison, MD 01/29/16 2154

## 2016-01-29 NOTE — Discharge Instructions (Signed)
Follow-up as previously arranged with cardiology for palpitations.  If you were given medicines take as directed.  If you are on coumadin or contraceptives realize their levels and effectiveness is altered by many different medicines.  If you have any reaction (rash, tongues swelling, other) to the medicines stop taking and see a physician.    If your blood pressure was elevated in the ER make sure you follow up for management with a primary doctor or return for chest pain, shortness of breath or stroke symptoms.  Please follow up as directed and return to the ER or see a physician for new or worsening symptoms.  Thank you. Vitals:   01/29/16 2000 01/29/16 2030 01/29/16 2100 01/29/16 2143  BP: 120/80 111/65 126/71 118/70  Pulse: 78 75 83 79  Resp: 18 20 21    Temp:      TempSrc:      SpO2: 97% 98% 99% 100%

## 2016-01-30 ENCOUNTER — Ambulatory Visit (HOSPITAL_COMMUNITY)
Admission: RE | Admit: 2016-01-30 | Discharge: 2016-01-30 | Disposition: A | Discharge status: Home / Self Care | Payer: BC Managed Care – PPO | Source: Ambulatory Visit | Attending: Cardiovascular Disease | Admitting: Cardiovascular Disease

## 2016-01-30 ENCOUNTER — Encounter (HOSPITAL_COMMUNITY): Payer: Self-pay | Admitting: Cardiovascular Disease

## 2016-01-30 ENCOUNTER — Encounter (HOSPITAL_COMMUNITY)
Admission: RE | Disposition: A | Discharge status: Home / Self Care | Payer: Self-pay | Source: Ambulatory Visit | Attending: Cardiovascular Disease

## 2016-01-30 DIAGNOSIS — R600 Localized edema: Secondary | ICD-10-CM | POA: Diagnosis not present

## 2016-01-30 DIAGNOSIS — R55 Syncope and collapse: Secondary | ICD-10-CM | POA: Insufficient documentation

## 2016-01-30 DIAGNOSIS — G894 Chronic pain syndrome: Secondary | ICD-10-CM | POA: Diagnosis not present

## 2016-01-30 DIAGNOSIS — Z79899 Other long term (current) drug therapy: Secondary | ICD-10-CM | POA: Diagnosis not present

## 2016-01-30 DIAGNOSIS — R002 Palpitations: Secondary | ICD-10-CM | POA: Insufficient documentation

## 2016-01-30 DIAGNOSIS — Z8249 Family history of ischemic heart disease and other diseases of the circulatory system: Secondary | ICD-10-CM | POA: Diagnosis not present

## 2016-01-30 DIAGNOSIS — E1121 Type 2 diabetes mellitus with diabetic nephropathy: Secondary | ICD-10-CM | POA: Diagnosis not present

## 2016-01-30 DIAGNOSIS — Z794 Long term (current) use of insulin: Secondary | ICD-10-CM | POA: Diagnosis not present

## 2016-01-30 DIAGNOSIS — Z6841 Body Mass Index (BMI) 40.0 and over, adult: Secondary | ICD-10-CM | POA: Diagnosis not present

## 2016-01-30 DIAGNOSIS — G4733 Obstructive sleep apnea (adult) (pediatric): Secondary | ICD-10-CM | POA: Insufficient documentation

## 2016-01-30 DIAGNOSIS — I471 Supraventricular tachycardia: Secondary | ICD-10-CM | POA: Diagnosis not present

## 2016-01-30 DIAGNOSIS — E785 Hyperlipidemia, unspecified: Secondary | ICD-10-CM | POA: Insufficient documentation

## 2016-01-30 DIAGNOSIS — I10 Essential (primary) hypertension: Secondary | ICD-10-CM | POA: Diagnosis not present

## 2016-01-30 DIAGNOSIS — F419 Anxiety disorder, unspecified: Secondary | ICD-10-CM | POA: Diagnosis not present

## 2016-01-30 DIAGNOSIS — F329 Major depressive disorder, single episode, unspecified: Secondary | ICD-10-CM | POA: Diagnosis not present

## 2016-01-30 HISTORY — PX: EP IMPLANTABLE DEVICE: SHX172B

## 2016-01-30 LAB — GLUCOSE, CAPILLARY: GLUCOSE-CAPILLARY: 237 mg/dL — AB (ref 65–99)

## 2016-01-30 SURGERY — LOOP RECORDER INSERTION

## 2016-01-30 MED ORDER — LIDOCAINE-EPINEPHRINE 1 %-1:100000 IJ SOLN
INTRAMUSCULAR | Status: DC | PRN
  Administered 2016-01-30: 10 mL

## 2016-01-30 MED ORDER — LIDOCAINE-EPINEPHRINE 1 %-1:100000 IJ SOLN
INTRAMUSCULAR | Status: AC
  Filled 2016-01-30: qty 1

## 2016-01-30 SURGICAL SUPPLY — 2 items
LOOP REVEAL LINQSYS (Prosthesis & Implant Heart) ×1 IMPLANT
PACK LOOP INSERTION (CUSTOM PROCEDURE TRAY) ×2 IMPLANT

## 2016-01-30 NOTE — Progress Notes (Signed)
Pt verbalized understanding of DC instructions, written copy given. Condition stable at time of DC home.

## 2016-01-30 NOTE — H&P (View-Only) (Signed)
Patient ID: [name], female   DOB: 1961/01/26, 55 y.o.   MRN: [id number] .    Cardiology Office Note    Date:  01/05/2016   ID:  [name], DOB 1960-03-23, MRN [id number]  PCP:  Crisoforo Oxford, PA-C  Cardiologist:   Sanda Klein, MD   Chief Complaint  Patient presents with  . Follow-up  . Edema    pt states both ankles and legs     History of Present Illness:  [name] is a 55 y.o. 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   About a month ago she saw Cecilie Kicks with complaints of worsening palpitations and 2 episodes of syncope. She had an episode of syncope carrying a laundry basket, and had another spell while on the toilet preceded by dizziness.  She has just completed a 30 day event monitor and during that time did not have either palpitations or syncope. Only normal rhythm was recorded.  Significant comorbid conditions include obstructive sleep apnea, treated hyperlipidemia and type 2 diabetes mellitus. She does not have known structural heart disease. Her previous open wound has healed completely. Her edema is well controlled on the current dose of furosemide on the which she takes only 5 days a week  She has had worsening leg edema (after her metoprolol dose was increased), persistent even after her furosemide dose was increased to 40 mg daily.  Past Medical History:  Diagnosis Date  . Abscess    increased drainage from abscess on buttock  . Anal fistula   . Anxiety   . Bilateral hip pain 05/27/2015  . Chronic pain syndrome 05/27/2015  . Depression    sees [name]  . Diabetes mellitus without complication ([place])   . Hypertension   . Sleep apnea    2008- sleep study, neg. for sleep apnea   . SVT (supraventricular tachycardia) ([facility])     Past Surgical History:  Procedure Laterality Date  . ANAL EXAMINATION UNDER ANESTHESIA  02/21/11   anal fistula  . BREAST SURGERY   patient does not remember date of procedure   pull fluid off lft br  . ELECTROPHYSIOLOGIC STUDY N/A 05/05/2015   Procedure: SVT Ablation;  Surgeon: Will Meredith Leeds, MD;  Location: [place] [facility];  Service: Cardiovascular;  Laterality: N/A;  . INCISE AND DRAIN ABCESS     abscess on right thigh and buttock  . KNEE ARTHROSCOPY     left  . SHOULDER SURGERY  04/14/09   right    Outpatient Medications Prior to Visit  Medication Sig Dispense Refill  . AMITIZA 24 MCG capsule TAKE ONE CAPSULE BY MOUTH TWICE DAILY WITH MEALS 60 capsule 2  . atorvastatin (LIPITOR) 20 MG tablet Take 1 tablet (20 mg total) by mouth at bedtime. 90 tablet 1  . citalopram (CELEXA) 20 MG tablet Take 1 tablet (20 mg total) by mouth daily. 30 tablet 2  . DULoxetine (CYMBALTA) 60 MG capsule Take 1 capsule (60 mg total) by mouth 2 (two) times daily. 60 capsule 2  . ferrous sulfate 325 (65 FE) MG tablet Take 1 tablet (325 mg total) by mouth 2 (two) times daily with a meal. 60 tablet 2  . HYDROcodone-acetaminophen (NORCO/VICODIN) 5-325 MG tablet Take 1 tablet by mouth every 6 (six) hours as needed for moderate pain. 120 tablet 0  . Insulin Degludec-Liraglutide (XULTOPHY) 100-3.6 UNIT-MG/ML SOPN Inject 20 Units into the skin daily. 3 mL 5  . metFORMIN (GLUCOPHAGE)  850 MG tablet Take 1 tablet (850 mg total) by mouth 2 (two) times daily with a meal. 60 tablet 2  . metoprolol (LOPRESSOR) 50 MG tablet Take 1 tablet (50 mg total) by mouth 2 (two) times daily. 60 tablet 9  . omeprazole (PRILOSEC) 20 MG capsule Take 1 capsule (20 mg total) by mouth daily. 30 capsule 3  . furosemide (LASIX) 40 MG tablet Take 0.5 tablets (20 mg total) by mouth daily. 90 tablet 2   No facility-administered medications prior to visit.      Allergies:   Tramadol   Social History   Social History  . Marital status: Divorced    Spouse name: N/A  . Number of children: N/A  . Years of education: N/A   Social History Main Topics  . Smoking  status: Never Smoker  . Smokeless tobacco: Never Used  . Alcohol use No  . Drug use: No  . Sexual activity: Yes   Other Topics Concern  . None   Social History Narrative  . None     Family History:  The patient's family history includes Alzheimer's disease in her mother; Cancer in her sister; Diabetes in her father; Hypertension in her mother.   ROS:   Please see the history of present illness.    ROS All other systems reviewed and are negative.   PHYSICAL EXAM:   VS:  BP 128/75   Pulse 81   Ht 5\' 3"  (1.6 m)   Wt 253 lb 6.4 oz (114.9 kg)   SpO2 96%   BMI 44.89 kg/m    GEN: Mildly obese, well developed, in no acute distress  HEENT: normal  Neck: no JVD, carotid bruits, or masses Cardiac: RRR; no murmurs, rubs, or gallops,no edema  Respiratory:  clear to auscultation bilaterally, normal work of breathing GI: soft, nontender, nondistended, + BS MS: no deformity or atrophy  Skin: warm and dry, no rash Neuro:  Alert and Oriented x 3, Strength and sensation are intact Psych: euthymic mood, full affect  Wt Readings from Last 3 Encounters:  01/05/16 253 lb 6.4 oz (114.9 kg)  01/04/16 253 lb 3.2 oz (114.9 kg)  12/07/15 245 lb 6.4 oz (111.3 kg)      Studies/Labs Reviewed:   EKG:  EKG is not ordered today.   Recent Labs: 08/23/2015: ALT 13 10/26/2015: Hemoglobin 11.8; Platelets 426 11/09/2015: TSH 0.46 01/04/2016: Brain Natriuretic Peptide 17.2; BUN 15; Creat 1.16; Potassium 3.7; Sodium 137   Lipid Panel    Component Value Date/Time   CHOL 175 12/07/2014 1534   TRIG 220.0 (H) 12/07/2014 1534   HDL 30.10 (L) 12/07/2014 1534   CHOLHDL 6 12/07/2014 1534   VLDL 44.0 (H) 12/07/2014 1534   LDLDIRECT 127.0 12/07/2014 1534    ASSESSMENT:    1. Vasovagal syncope   2. AVNRT (AV nodal re-entry tachycardia) ([place])   3. Morbid obesity ([place])   4. Type 2 diabetes mellitus with diabetic nephropathy, without long-term current use of insulin ([place])   5. Bilateral leg edema       PLAN:  In order of problems listed above:  1. Syncope: One of her episodes sounds consistent with a vasovagal event, the other not so clearly. No significant arrhythmia, either bradycardia or tachycardia has been recorded on her event monitor. If syncope occurs again I would strongly recommend an implantable loop recorder. Reviewed the purpose, pros and cons, potential complications of this device in detail with her today. Asked her to call us right away  if she has not a syncopal event 2. AV node reentry tachycardia, s/p RF ablation. 3. Obesity: Strongly encouraged to pursue weight loss to help with her diabetes and to lessen the likelihood of more complex atrial arrhythmias in the future 4. DM: control has deteriorated since earlier this year 44. Edema: We'll increase the dose of furosemide slightly    Medication Adjustments/Labs and Tests Ordered: Current medicines are reviewed at length with the patient today.  Concerns regarding medicines are outlined above.  Medication changes, Labs and Tests ordered today are listed in the Patient Instructions below. Patient Instructions  Dr Sallyanne Kuster has recommended making the following medication changes: 1. INCREASE Furosemide to 60 mg once daily  Your physician recommends that you schedule a follow-up appointment in 6 months. You will receive a reminder letter in the mail two months in advance. If you don't receive a letter, please call our office to schedule the follow-up appointment.  If you need a refill on your cardiac medications before your next appointment, please call your pharmacy. Signed, Sanda Klein, MD  01/05/2016 4:06 PM    [facility] [place], [place], [place]  [address] Phone: [phone]; Fax: [phone]

## 2016-01-30 NOTE — Op Note (Signed)
LOOP RECORDER IMPLANT   Procedure report  Procedure performed:  Loop recorder implantation   Reason for procedure:  1. Recurrent syncope/near-syncope 2. Palpitations Procedure performed by:  Sanda Klein, MD  Complications:  None  Estimated blood loss:  <5 mL  Medications administered during procedure:  Lidocaine 1% with 1/10,000 epinephrine 10 mL locally Device details:  Medtronic Reveal Linq model number [id number], serial number [id number] Procedure details:  After the risks and benefits of the procedure were discussed the patient provided informed consent. The patient was prepped and draped in usual sterile fashion. Local anesthesia was administered to an area 2 cm to the left of the sternum in the 4th intercostal space. A cutaneous incision was made using the incision tool. The introducer was then used to create a subcutaneous tunnel and carefully deploy the device. Local pressure was held to ensure hemostasis.  The incision was closed with SteriStrips and a sterile dressing was applied.  R waves 0.43V  Sanda Klein, MD, [facility] [phone] office [phone] pager 01/30/2016 2:15 PM

## 2016-01-30 NOTE — Interval H&P Note (Signed)
History and Physical Interval Note:  01/30/2016 12:42 PM  [name]  has presented today for surgery, with the diagnosis of rapid heart rate  The various methods of treatment have been discussed with the patient and family. After consideration of risks, benefits and other options for treatment, the patient has consented to  Procedure(s): Loop Recorder Insertion (N/A) as a surgical intervention .  The patient's history has been reviewed, patient examined, no change in status, stable for surgery.  I have reviewed the patient's chart and labs.  Questions were answered to the patient's satisfaction.     Nidya Bouyer

## 2016-01-31 ENCOUNTER — Other Ambulatory Visit: Payer: Self-pay | Admitting: Medical

## 2016-01-31 ENCOUNTER — Telehealth: Payer: Self-pay

## 2016-01-31 MED ORDER — OMEPRAZOLE 40 MG PO CPDR: 40.0000 mg | DELAYED_RELEASE_CAPSULE | Freq: Two times a day (BID) | ORAL | 1 refills | Status: DC

## 2016-01-31 NOTE — Telephone Encounter (Signed)
Pt called about her acid reflux wanted to know if she can have something stronger because is it taking omeprazole otc. And the meds are not helping wants to know if she a rx call in to [facility]

## 2016-01-31 NOTE — Telephone Encounter (Signed)
Reviewed [name] MRI with Dr. Naaman Plummer on 01/30/16 Placed a call to [name] we discussed her MRI Results she verbalizes understanding.

## 2016-01-31 NOTE — Telephone Encounter (Signed)
I changed to Omeprazole 40mg .  Can use 1 or 2 times daily then next week, then go to once daily.   This assumes she is having upper abdominal pain, nausea, and recent foods that would trigger reflux.  If not, may need to come in for recheck

## 2016-02-01 ENCOUNTER — Telehealth: Payer: Self-pay | Admitting: Cardiovascular Disease

## 2016-02-01 ENCOUNTER — Ambulatory Visit (INDEPENDENT_AMBULATORY_CARE_PROVIDER_SITE_OTHER): Payer: BC Managed Care – PPO | Admitting: *Deleted

## 2016-02-01 DIAGNOSIS — Z95818 Presence of other cardiac implants and grafts: Secondary | ICD-10-CM

## 2016-02-01 NOTE — Progress Notes (Signed)
Patient presents to the office for a wound check s/p ILR implant on 11/20. Patient had c/o bleeding from incision site w/ a saturated dressing. Patient had applied bandaid overtop of steri strips. No active bleeding noted. Saturated steri strips removed and new ones applied (ok per Dr.Croitoru). Patient to follow up for her actual wound check on 02/15/16.  Patient was encouraged to apply pressure to site if bleeding recurs over the holiday weekend. Patient voiced understanding.

## 2016-02-01 NOTE — Telephone Encounter (Signed)
Called patient  Gave her the information .

## 2016-02-01 NOTE — Telephone Encounter (Signed)
Spoke to patient. Notes loop recorder insertion 2 days ago. She states concern, as her dressing was not "wet" last night when changed but she woke this morning, had changed dressing, noted it to be saturated w blood. I spoke w Dr. Sallyanne Kuster - a wound check appt w device clinic was advised.  Spoke to [place] w device clinic & have informed of situation, I'm forwarding note. Called patient & made her aware Dr. Sallyanne Kuster has reviewed and given recommendation for eval in device clinic today.

## 2016-02-01 NOTE — Telephone Encounter (Signed)
New Message  Pt voiced wanting nurse to return her call.  Please f/u

## 2016-02-01 NOTE — Telephone Encounter (Signed)
Appt scheduled for today @ 1400 with Device Clinic. Patient voiced understanding.  Patient aware that her appt is at [facility] office.

## 2016-02-15 ENCOUNTER — Ambulatory Visit (INDEPENDENT_AMBULATORY_CARE_PROVIDER_SITE_OTHER): Payer: BC Managed Care – PPO | Admitting: *Deleted

## 2016-02-15 DIAGNOSIS — Z95818 Presence of other cardiac implants and grafts: Secondary | ICD-10-CM

## 2016-02-15 LAB — CUP PACEART INCLINIC DEVICE CHECK
Implantable Pulse Generator Implant Date: 20171120
MDC IDC SESS DTM: 20171206172257

## 2016-02-15 NOTE — Progress Notes (Signed)
Wound check appointment. Steri-strips removed by patient at home. Wound without redness or edema. Incision edges approximated, wound well healed. Loop check in clinic. Battery status: good. R-waves 68mV. 0 symptom episodes, 0 tachy episodes, 0 pause episodes, 0 brady episodes. 0 AF episodes. Monthly summary reports and ROV with [facility] 05/2016

## 2016-02-17 ENCOUNTER — Other Ambulatory Visit: Payer: Self-pay | Admitting: Medical

## 2016-02-20 ENCOUNTER — Telehealth: Payer: Self-pay | Admitting: Medical

## 2016-02-20 NOTE — Telephone Encounter (Signed)
Can this patient have this ?

## 2016-02-20 NOTE — Telephone Encounter (Signed)
Get her in for diabetes f/u.  We made med changes last visit in October.

## 2016-02-20 NOTE — Telephone Encounter (Signed)
Pt is coming in dec the 20th

## 2016-02-22 ENCOUNTER — Encounter
Payer: BC Managed Care – PPO | Attending: Physical Medicine & Rehabilitation | Admitting: Physical Medicine & Rehabilitation

## 2016-02-22 ENCOUNTER — Encounter: Payer: Self-pay | Admitting: Physical Medicine & Rehabilitation

## 2016-02-22 DIAGNOSIS — Z8249 Family history of ischemic heart disease and other diseases of the circulatory system: Secondary | ICD-10-CM | POA: Insufficient documentation

## 2016-02-22 DIAGNOSIS — I471 Supraventricular tachycardia: Secondary | ICD-10-CM | POA: Diagnosis not present

## 2016-02-22 DIAGNOSIS — G8929 Other chronic pain: Secondary | ICD-10-CM | POA: Insufficient documentation

## 2016-02-22 DIAGNOSIS — M160 Bilateral primary osteoarthritis of hip: Secondary | ICD-10-CM

## 2016-02-22 DIAGNOSIS — F418 Other specified anxiety disorders: Secondary | ICD-10-CM | POA: Diagnosis not present

## 2016-02-22 DIAGNOSIS — M25552 Pain in left hip: Secondary | ICD-10-CM | POA: Insufficient documentation

## 2016-02-22 DIAGNOSIS — I10 Essential (primary) hypertension: Secondary | ICD-10-CM | POA: Insufficient documentation

## 2016-02-22 DIAGNOSIS — Z833 Family history of diabetes mellitus: Secondary | ICD-10-CM | POA: Insufficient documentation

## 2016-02-22 DIAGNOSIS — G4730 Sleep apnea, unspecified: Secondary | ICD-10-CM | POA: Insufficient documentation

## 2016-02-22 DIAGNOSIS — E119 Type 2 diabetes mellitus without complications: Secondary | ICD-10-CM | POA: Insufficient documentation

## 2016-02-22 DIAGNOSIS — Z809 Family history of malignant neoplasm, unspecified: Secondary | ICD-10-CM | POA: Diagnosis not present

## 2016-02-22 DIAGNOSIS — M25551 Pain in right hip: Secondary | ICD-10-CM | POA: Diagnosis not present

## 2016-02-22 MED ORDER — OXYCODONE HCL 5 MG PO TABS: 5.0000 mg | ORAL_TABLET | Freq: Four times a day (QID) | ORAL | 0 refills | Status: DC | PRN

## 2016-02-22 NOTE — Patient Instructions (Signed)
SUPPLEMENTS USEFUL FOR OSTEOARTHRITIS: OMEGA 3 FATTY ACIDS, TURMERIC, GINGER, TART CHERRY EXTRACT, CELERY SEED, GLUCOSAMINE WITH CHONDROITIN      PLEASE CALL ME WITH ANY PROBLEMS OR QUESTIONS ([phone])   HAPPY HOLIDAYS!!!!                    *                * *             *   *   *         *  *   *  *  *     *  *  *  *  *  *  * *  *  *  *  *  *  *  *  *  * *               *  *               *  *               *  *  

## 2016-02-22 NOTE — Progress Notes (Signed)
Subjective:    Patient ID: [name], female    DOB: 1961/02/26, 55 y.o.   MRN: [id number]  HPI   [name] is here in follow up of her chronic hip pain. Her left hip remains most tender but they both continue to give her problems. I reviewed her MRI at length which reveals moderate degenerative changes of the femoral head cartilage and acetabular cartilage and mild changes on the right.   She continues to work full time and is on her feet throughout the day. She takes about 11000 steps per day on avg. She has lost around 40lbs over the last year or so by cutting down soft drinks and eating more healthy.   Her left knee gives her some discomfort as well when she's up on it for longer periods of time  The hydrocodone has helped her pain to an extent. She is also using tylenol ES up to 3000mg  per day in additoin to the tylenol in her vicodin.      Pain Inventory Average Pain 7 Pain Right Now 8 My pain is sharp, dull and aching  In the last 24 hours, has pain interfered with the following? General activity 4 Relation with others 2 Enjoyment of life 2 What TIME of day is your pain at its worst? night Sleep (in general) NA  Pain is worse with: walking, bending and sitting Pain improves with: rest and heat/ice Relief from Meds: 6  Mobility walk without assistance ability to climb steps?  yes do you drive?  yes  Function employed # of hrs/week 40  Neuro/Psych No problems in this area  Prior Studies Any changes since last visit?  no  Physicians involved in your care Any changes since last visit?  no   Family History  Problem Relation Age of Onset  . Hypertension Mother   . Alzheimer's disease Mother   . Diabetes Father   . Cancer Sister   . Anesthesia problems Neg Hx   . Hypotension Neg Hx   . Malignant hyperthermia Neg Hx   . Pseudochol deficiency Neg Hx    Social History   Social History  . Marital status: Divorced    Spouse name: N/A  .  Number of children: N/A  . Years of education: N/A   Social History Main Topics  . Smoking status: Never Smoker  . Smokeless tobacco: Never Used  . Alcohol use No  . Drug use: No  . Sexual activity: Yes   Other Topics Concern  . Not on file   Social History Narrative  . No narrative on file   Past Surgical History:  Procedure Laterality Date  . ANAL EXAMINATION UNDER ANESTHESIA  02/21/11   anal fistula  . BREAST SURGERY  patient does not remember date of procedure   pull fluid off lft br  . ELECTROPHYSIOLOGIC STUDY N/A 05/05/2015   Procedure: SVT Ablation;  Surgeon: Will Meredith Leeds, MD;  Location: [facility];  Service: Cardiovascular;  Laterality: N/A;  . EP IMPLANTABLE DEVICE N/A 01/30/2016   Procedure: Loop Recorder Insertion;  Surgeon: Sanda Klein, MD;  Location: [facility];  Service: Cardiovascular;  Laterality: N/A;  . INCISE AND DRAIN ABCESS     abscess on right thigh and buttock  . KNEE ARTHROSCOPY     left  . SHOULDER SURGERY  04/14/09   right   Past Medical History:  Diagnosis Date  . Abscess    increased drainage from abscess on buttock  .  Anal fistula   . Anxiety   . Bilateral hip pain 05/27/2015  . Chronic pain syndrome 05/27/2015  . Depression    sees [name]  . Diabetes mellitus without complication ([place])   . Hypertension   . Sleep apnea    2008- sleep study, neg. for sleep apnea   . SVT (supraventricular tachycardia) ([place])    LMP 09/15/2014   Opioid Risk Score:   Fall Risk Score:  `1  Depression screen PHQ 2/9  Depression screen [facility] 2/9 02/22/2016 11/28/2015 10/26/2015 05/12/2015 09/28/2014 08/23/2014 06/23/2014  Decreased Interest 0 0 3 2 1  0 0  Down, Depressed, Hopeless 0 0 3 1 0 0 0  PHQ - 2 Score 0 0 6 3 1  0 0  Altered sleeping - - 3 3 - - -  Tired, decreased energy - - 3 1 - - -  Change in appetite - - 3 1 - - -  Feeling bad or failure about yourself  - - 3 0 - - -  Trouble concentrating - - 3 0 - - -  Moving slowly or  fidgety/restless - - 0 0 - - -  Suicidal thoughts - - 0 0 - - -  PHQ-9 Score - - 21 8 - - -  Difficult doing work/chores - - Very difficult Somewhat difficult - - -    Review of Systems  Constitutional: Negative.   HENT: Negative.   Eyes: Negative.   Respiratory: Negative.   Cardiovascular: Negative.   Gastrointestinal: Negative.   Endocrine: Negative.   Genitourinary: Negative.   Musculoskeletal: Negative.   Skin: Negative.   Allergic/Immunologic: Negative.   Neurological: Negative.   Hematological: Negative.   Psychiatric/Behavioral: Negative.   All other systems reviewed and are negative.      Objective:   Physical Exam  Gen: morbidly obese---weight decreased however HENT: Normocephalic, Atraumatic Eyes: EOMI, Conj WNL Cardio: S1, S2 normal, RRR Pulm: B/l clear to auscultation. Effort normal Abd: +obese. Soft, non-distended, non-tender, BS+ MSK: Gait Antalgic left more than right..  + FABERs  for b/l hip pain with minimal movement with pain during palpation of both hips/trochs  Neuro: CN II-XII grossly intact.  Sensation intact to light touch in all LE dermatomes Reflexes 1+ in b/l LE Strength is5/5 in all LE myotomes Neg SLR b/l Skin: Warm and Dry  Assessment & Plan:  55 y/o female with pmh of SVT, DM, HTN, anxiety/depression and psh of left knee scope and right shoulder scope presents in b/l hip pain, L>R.   1. 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 Morbid obesity working on weight loss with improved diet  -needs to cut out soft drinks completely especially considering her  diabetes  .  Followup in a month with NP. 3minutes of face to face patient care time were spent during this visit. All questions were encouraged and answered. Marland Kitchen

## 2016-02-27 ENCOUNTER — Telehealth: Payer: Self-pay

## 2016-02-27 NOTE — Telephone Encounter (Signed)
Patient called today, states prescribed oxycodone is causing her to have heart palpations. States they are causing her heart monitors alarm to go off.  Patient advised to discontinue medications and advise request made to Dr. Naaman Plummer as the patient cannot afford to come in any sooner then current listed appointment.  Please advise for this situation.

## 2016-02-29 ENCOUNTER — Ambulatory Visit (INDEPENDENT_AMBULATORY_CARE_PROVIDER_SITE_OTHER): Payer: BC Managed Care – PPO | Admitting: Medical

## 2016-02-29 ENCOUNTER — Ambulatory Visit (INDEPENDENT_AMBULATORY_CARE_PROVIDER_SITE_OTHER): Payer: BC Managed Care – PPO | Admitting: *Deleted

## 2016-02-29 ENCOUNTER — Encounter: Payer: Self-pay | Admitting: Medical

## 2016-02-29 VITALS — BP 130/80 | HR 88 | Wt 214.0 lb

## 2016-02-29 DIAGNOSIS — R32 Unspecified urinary incontinence: Secondary | ICD-10-CM

## 2016-02-29 DIAGNOSIS — E876 Hypokalemia: Secondary | ICD-10-CM | POA: Insufficient documentation

## 2016-02-29 DIAGNOSIS — E1121 Type 2 diabetes mellitus with diabetic nephropathy: Secondary | ICD-10-CM

## 2016-02-29 DIAGNOSIS — R002 Palpitations: Secondary | ICD-10-CM

## 2016-02-29 DIAGNOSIS — R609 Edema, unspecified: Secondary | ICD-10-CM

## 2016-02-29 DIAGNOSIS — E785 Hyperlipidemia, unspecified: Secondary | ICD-10-CM

## 2016-02-29 DIAGNOSIS — N3941 Urge incontinence: Secondary | ICD-10-CM | POA: Diagnosis not present

## 2016-02-29 DIAGNOSIS — E041 Nontoxic single thyroid nodule: Secondary | ICD-10-CM | POA: Diagnosis not present

## 2016-02-29 LAB — POCT URINALYSIS DIPSTICK
BILIRUBIN UA: NEGATIVE
KETONES UA: NEGATIVE
Leukocytes, UA: NEGATIVE
Nitrite, UA: NEGATIVE
Protein, UA: NEGATIVE
RBC UA: NEGATIVE
Urobilinogen, UA: NEGATIVE
pH, UA: 6

## 2016-02-29 MED ORDER — LUBIPROSTONE 24 MCG PO CAPS: ORAL_CAPSULE | ORAL | 1 refills | Status: DC

## 2016-02-29 MED ORDER — METFORMIN HCL 850 MG PO TABS: 850.0000 mg | ORAL_TABLET | Freq: Two times a day (BID) | ORAL | 2 refills | Status: DC

## 2016-02-29 MED ORDER — DULOXETINE HCL 60 MG PO CPEP: 60.0000 mg | ORAL_CAPSULE | Freq: Every day | ORAL | 1 refills | Status: DC

## 2016-02-29 MED ORDER — POTASSIUM CHLORIDE ER 10 MEQ PO TBCR: 10.0000 meq | EXTENDED_RELEASE_TABLET | Freq: Every day | ORAL | 0 refills | Status: DC

## 2016-02-29 NOTE — Progress Notes (Signed)
Subjective: Chief Complaint  Patient presents with  . dm check    dm check , having with holding her urine    Here for f/u on diabetes.   Checking sugars, getting like roller coaster.  Night time 190-270, fasting can be 150-180.  Been on new medication 3-4 weeks and just went up on dose last week.    Checking feet daily, no foot concerns.    Eats 2-3 times daily.  Exercise - walking. Lost 12lb since last viist.   having some problems with incontinence.   Has little warning before she had to go to the bathroom.  Having unexpected incontinence, can't get to toilet quick enough.  No incontinent with laugh or coughing.   Happens with urge to urinate.  No burning, no urine odor.   No prior UTI.  These problems just started within last 2 weeks ago.   No vaginal bulge. No hx/o prolapse or cystocele.   Doesn't try to hold urine at work.   Is drinking a lot of water.  Doing 30 units Xultophy QHS, just increased few days ago from 20 u QHS.  taking metformin 850mg  BID  Since last visit was seen in the ED for syncope, is being followed by cardiology currently with cardiac monitoring /event monitor.  Dietician year and a half ago.  Past Medical History:  Diagnosis Date  . Abscess    increased drainage from abscess on buttock  . Anal fistula   . Anxiety   . Bilateral hip pain 05/27/2015  . Chronic pain syndrome 05/27/2015  . Depression    sees [name]  . Diabetes mellitus without complication ([place])   . Hypertension   . Sleep apnea    2008- sleep study, neg. for sleep apnea   . SVT (supraventricular tachycardia) ([place])    Current Outpatient Prescriptions on File Prior to Visit  Medication Sig Dispense Refill  . acetaminophen (TYLENOL) 500 MG tablet Take 1,000 mg by mouth every 6 (six) hours as needed for moderate pain or headache.    . furosemide (LASIX) 40 MG tablet Take 1.5 tablets (60 mg total) by mouth daily. 270 tablet 3  . Insulin Degludec-Liraglutide (XULTOPHY) 100-3.6 UNIT-MG/ML  SOPN Inject 20 Units into the skin daily. 3 mL 5  . metoprolol (LOPRESSOR) 50 MG tablet Take 1 tablet (50 mg total) by mouth 3 (three) times daily. 270 tablet 1  . oxyCODONE (OXY IR/ROXICODONE) 5 MG immediate release tablet Take 1 tablet (5 mg total) by mouth every 6 (six) hours as needed for severe pain. 120 tablet 0   No current facility-administered medications on file prior to visit.    ROS as in subjective   Objective: BP 130/80   Pulse 88   Wt 214 lb (97.1 kg)   LMP 09/15/2014   SpO2 98%   BMI 37.91 kg/m   Wt Readings from Last 3 Encounters:  02/29/16 214 lb (97.1 kg)  01/30/16 247 lb (112 kg)  01/29/16 247 lb (112 kg)   BP Readings from Last 3 Encounters:  02/29/16 130/80  01/30/16 123/80  01/29/16 106/68   General appearance: alert, no distress, WD/WN Neck: supple, no lymphadenopathy, no thyromegaly, no masses Heart: RRR, normal S1, S2, no murmurs Lungs: CTA bilaterally, no wheezes, rhonchi, or rales Abdomen: +bs, soft, non tender, non distended, no masses, no hepatomegaly, no splenomegaly Pulses: 2+ symmetric, upper and lower extremities, normal cap refill Ext: no edema    Assessment: Encounter Diagnoses  Name Primary?  . Type 2  diabetes mellitus with diabetic nephropathy, without long-term current use of insulin ([place]) Yes  . Heart palpitations   . Hyperlipidemia, unspecified hyperlipidemia type   . Urinary incontinence, unspecified type   . Urge incontinence   . Thyroid nodule   . Hypokalemia   . Edema, unspecified type      Plan: Diabetes - increase to Xultophy 32 u QHS in a week, the increase 2 units weekly until morning sugars under 130.   C/t metformin, healthy diet, exercise  Heart palpations - f/u with cardiology as planned  Hypokalemia - begin potassium while on lasix  incontinence - consider detrol, but for now begin home kegel exercises as discussed, consider PT, pelvic exam next visit  Thyroid nodule - biopsy not worrisome  03/2015  Edema - c/t lasix 1.5mg  tablets daily  F/u 59mo.

## 2016-02-29 NOTE — Progress Notes (Signed)
[facility] Summary Report / Loop Recorder 

## 2016-02-29 NOTE — Patient Instructions (Signed)
Kegel Exercises  The goal of Kegel exercises is to isolate and exercise your pelvic floor muscles. These muscles act as a hammock that supports the rectum, vagina, small intestine, and uterus. As the muscles weaken, the hammock sags and these organs are displaced from their normal positions. Kegel exercises can strengthen your pelvic floor muscles and help you to improve bladder and bowel control, improve sexual response, and help reduce many problems and some discomfort during pregnancy. Kegel exercises can be done anywhere and at any time.  HOW TO PERFORM KEGEL EXERCISES  1. Locate your pelvic floor muscles. To do this, squeeze (contract) the muscles that you use when you try to stop the flow of urine. You will feel a tightness in the vaginal area (women) and a tight lift in the rectal area (men and women).  2. When you begin, contract your pelvic muscles tight for 2-5 seconds, then relax them for 2-5 seconds. This is one set. Do 4-5 sets with a short pause in between.  3. Contract your pelvic muscles for 8-10 seconds, then relax them for 8-10 seconds. Do 4-5 sets. If you cannot contract your pelvic muscles for 8-10 seconds, try 5-7 seconds and work your way up to 8-10 seconds. Your goal is 4-5 sets of 10 contractions each day.  Keep your stomach, buttocks, and legs relaxed during the exercises. Perform sets of both short and long contractions. Vary your positions. Perform these contractions 3-4 times per day. Perform sets while you are:    · Lying in bed in the morning.  · Standing at lunch.  · Sitting in the late afternoon.  · Lying in bed at night.   You should do 40-50 contractions per day. Do not perform more Kegel exercises per day than recommended. Overexercising can cause muscle fatigue. Continue these exercises for for at least 15-20 weeks or as directed by your caregiver.     This information is not intended to replace advice given to you by your  health care provider. Make sure you discuss any questions you have with your health care provider.     Document Released: 02/13/2012 Document Revised: 03/19/2014 Document Reviewed: 01/16/2015  Elsevier Interactive Patient Education ©2017 [employer]vier Inc[employer]

## 2016-03-06 ENCOUNTER — Other Ambulatory Visit: Payer: Self-pay | Admitting: Medical

## 2016-03-06 DIAGNOSIS — Z1231 Encounter for screening mammogram for malignant neoplasm of breast: Secondary | ICD-10-CM

## 2016-03-07 MED ORDER — HYDROCODONE-ACETAMINOPHEN 7.5-325 MG PO TABS: 1.0000 | ORAL_TABLET | Freq: Four times a day (QID) | ORAL | 0 refills | Status: DC | PRN

## 2016-03-07 NOTE — Telephone Encounter (Signed)
We can try hydrocodone 7.5mg  for pain in place of the oxycodone (had been on hydrocodone 5mg  before).

## 2016-03-08 ENCOUNTER — Ambulatory Visit
Admission: RE | Admit: 2016-03-08 | Discharge: 2016-03-08 | Disposition: A | Discharge status: Home / Self Care | Payer: BC Managed Care – PPO | Source: Ambulatory Visit | Attending: Medical | Admitting: Medical

## 2016-03-08 DIAGNOSIS — Z1231 Encounter for screening mammogram for malignant neoplasm of breast: Secondary | ICD-10-CM

## 2016-03-08 NOTE — Telephone Encounter (Signed)
[name] brought her oxycodone 5 mg tablets

## 2016-03-08 NOTE — Telephone Encounter (Signed)
Contacted patient, informed that Dr. Naaman Plummer has written and signed a new script. Asked patient to bring her old meds (oxycodone) to have them destroyed. She acknowledged and will come by in the early afternoon.

## 2016-03-19 ENCOUNTER — Other Ambulatory Visit: Payer: Self-pay | Admitting: Medical

## 2016-03-19 ENCOUNTER — Telehealth: Payer: Self-pay | Admitting: Medical

## 2016-03-19 MED ORDER — FLUCONAZOLE 150 MG PO TABS: ORAL_TABLET | ORAL | 0 refills | Status: DC

## 2016-03-19 NOTE — Telephone Encounter (Signed)
Pt aware. /RLB  

## 2016-03-19 NOTE — Telephone Encounter (Signed)
Diflucan sent

## 2016-03-19 NOTE — Telephone Encounter (Signed)
Pt called and stated that she has a yeast infection that she believes is coming form the new med she was put on. She is requesting something be sent in for her. Pt uses [facility] on [address] and can be reached at [phone].

## 2016-03-20 ENCOUNTER — Other Ambulatory Visit: Payer: Self-pay | Admitting: Cardiovascular Disease

## 2016-03-20 ENCOUNTER — Telehealth: Payer: Self-pay | Admitting: *Deleted

## 2016-03-20 NOTE — Telephone Encounter (Signed)
°  1. Has your device fired? no  2. Is you device beeping? yes  3. Are you experiencing draining or swelling at device site? no 4. Are you calling to see if we received your device transmission? yes 5. Have you passed out?  No, she is having sweating and fast heart beats

## 2016-03-20 NOTE — Telephone Encounter (Signed)
Pt called and stated that her rate has been around 110-124 bpm. She stated that when it gets this high she gets really sick and has to lay down. No chest pain, some shortness of breath, she gets light headed. Instructed pt to send a remote transmission w/ her home monitor. Transmission received. Discussed and reviewed transmission with device tech. Routed message to MD and MD CMA.

## 2016-03-21 NOTE — Telephone Encounter (Signed)
LMOVM for pt to return call 

## 2016-03-21 NOTE — Telephone Encounter (Signed)
There are no rhythm abnormalities seen on the transmission. Heart rate is indeed 120, but normal (sinus tachycardia). Please make sure she is well hydrated, especially if glucose running high. Both excessively high or low blood sugar can cause rapid heart beat.

## 2016-03-21 NOTE — Telephone Encounter (Signed)
Follow up ° ° °Pt verbalized that she is returning call for rn °

## 2016-03-21 NOTE — Telephone Encounter (Signed)
Spoke w/ pt and informed her of MD recommendations. Pt verbalized understanding.

## 2016-03-28 ENCOUNTER — Ambulatory Visit: Payer: BC Managed Care – PPO | Admitting: Medical

## 2016-03-29 ENCOUNTER — Other Ambulatory Visit: Payer: Self-pay | Admitting: Medical

## 2016-03-29 NOTE — Telephone Encounter (Signed)
Is this okay to refill? 

## 2016-03-30 ENCOUNTER — Ambulatory Visit (INDEPENDENT_AMBULATORY_CARE_PROVIDER_SITE_OTHER): Payer: BC Managed Care – PPO | Admitting: *Deleted

## 2016-03-30 DIAGNOSIS — R002 Palpitations: Secondary | ICD-10-CM | POA: Diagnosis not present

## 2016-03-30 NOTE — Progress Notes (Signed)
[facility] Summary Report / Loop Recorder 

## 2016-03-31 ENCOUNTER — Encounter: Payer: Self-pay | Admitting: Medical

## 2016-04-02 ENCOUNTER — Telehealth: Payer: Self-pay | Admitting: Medical

## 2016-04-02 ENCOUNTER — Other Ambulatory Visit: Payer: Self-pay

## 2016-04-02 DIAGNOSIS — E119 Type 2 diabetes mellitus without complications: Secondary | ICD-10-CM

## 2016-04-02 MED ORDER — INSULIN DEGLUDEC-LIRAGLUTIDE 100-3.6 UNIT-MG/ML ~~LOC~~ SOPN: 1.0000 "pen " | PEN_INJECTOR | Freq: Every morning | SUBCUTANEOUS | 0 refills | Status: DC

## 2016-04-02 NOTE — Telephone Encounter (Signed)
Called pt gave her a sample pen and pin tips.

## 2016-04-02 NOTE — Telephone Encounter (Signed)
If we have Xultophy samples, give her one with needles

## 2016-04-02 NOTE — Telephone Encounter (Signed)
Mickel Baas, please help me with this.   I recent prescribed Xultophy for her, but her copay will be over $500!  The drug rep is Aaron Edelman, so call him to see if he can help [place] on this.  Other drugs she has been on include the following:  Glipizide Metformin Trulicity Basaglar Lantus Humalog Levemir  She was doing somewhat ok on Trulicity then this got expensive or not covered.  Recently changed to Xultophy since she wasn't at goal with the other medications we've tried.   Thanks [employer]

## 2016-04-04 NOTE — Telephone Encounter (Signed)
I also left a message for Drug rep Aaron Edelman

## 2016-04-04 NOTE — Telephone Encounter (Signed)
I called [facility] T# [phone] to find out about pt's medications.  Pt has a $1250 deductible which applies to medical and pharmacy so whatever medicines she gets she will have to pay full price until her deductible to met.  She will have to pay $993 for 30 days for the first month for Xultophy.  After her deductible has been met her cost will be $198.74 per month.  She can get Trulicity either strength for $30 each month which is on her preventative list and this is the only injectable on the preventative list.  I did ask that they fax a copy of the list to our office for her records.

## 2016-04-06 NOTE — Telephone Encounter (Signed)
Left message for pt.  Sample in refrig.  [name] is to bring more samples in about a week and also discount cards

## 2016-04-06 NOTE — Telephone Encounter (Signed)
Called & spoke with Aaron Edelman and he states we can give pt samples and after pt's deductible is met that she should be able to use the discount card and her Xultophy should be only $30 per month because it is still the lowest co pay injectable available.

## 2016-04-06 NOTE — Telephone Encounter (Signed)
So does this mean she will pay towards deductible or do I need to prescribe something else

## 2016-04-09 ENCOUNTER — Encounter: Payer: BC Managed Care – PPO | Attending: Physical Medicine & Rehabilitation | Admitting: Registered Nurse

## 2016-04-09 ENCOUNTER — Encounter: Payer: Self-pay | Admitting: Registered Nurse

## 2016-04-09 VITALS — BP 113/78 | HR 97

## 2016-04-09 DIAGNOSIS — M160 Bilateral primary osteoarthritis of hip: Secondary | ICD-10-CM | POA: Diagnosis not present

## 2016-04-09 DIAGNOSIS — I10 Essential (primary) hypertension: Secondary | ICD-10-CM | POA: Diagnosis not present

## 2016-04-09 DIAGNOSIS — Z833 Family history of diabetes mellitus: Secondary | ICD-10-CM | POA: Insufficient documentation

## 2016-04-09 DIAGNOSIS — Z809 Family history of malignant neoplasm, unspecified: Secondary | ICD-10-CM | POA: Diagnosis not present

## 2016-04-09 DIAGNOSIS — I471 Supraventricular tachycardia: Secondary | ICD-10-CM | POA: Insufficient documentation

## 2016-04-09 DIAGNOSIS — E119 Type 2 diabetes mellitus without complications: Secondary | ICD-10-CM | POA: Insufficient documentation

## 2016-04-09 DIAGNOSIS — G8929 Other chronic pain: Secondary | ICD-10-CM | POA: Insufficient documentation

## 2016-04-09 DIAGNOSIS — M545 Low back pain, unspecified: Secondary | ICD-10-CM

## 2016-04-09 DIAGNOSIS — M25552 Pain in left hip: Secondary | ICD-10-CM | POA: Diagnosis not present

## 2016-04-09 DIAGNOSIS — Z8249 Family history of ischemic heart disease and other diseases of the circulatory system: Secondary | ICD-10-CM | POA: Insufficient documentation

## 2016-04-09 DIAGNOSIS — Z79899 Other long term (current) drug therapy: Secondary | ICD-10-CM

## 2016-04-09 DIAGNOSIS — G894 Chronic pain syndrome: Secondary | ICD-10-CM | POA: Diagnosis not present

## 2016-04-09 DIAGNOSIS — G4730 Sleep apnea, unspecified: Secondary | ICD-10-CM | POA: Diagnosis not present

## 2016-04-09 DIAGNOSIS — M25551 Pain in right hip: Secondary | ICD-10-CM | POA: Diagnosis not present

## 2016-04-09 DIAGNOSIS — Z5181 Encounter for therapeutic drug level monitoring: Secondary | ICD-10-CM

## 2016-04-09 DIAGNOSIS — F418 Other specified anxiety disorders: Secondary | ICD-10-CM | POA: Diagnosis not present

## 2016-04-09 MED ORDER — HYDROCODONE-ACETAMINOPHEN 10-325 MG PO TABS: 1.0000 | ORAL_TABLET | Freq: Four times a day (QID) | ORAL | 0 refills | Status: DC | PRN

## 2016-04-09 MED ORDER — HYDROCODONE-ACETAMINOPHEN 7.5-325 MG PO TABS: 1.0000 | ORAL_TABLET | Freq: Four times a day (QID) | ORAL | 0 refills | Status: DC | PRN

## 2016-04-09 NOTE — Progress Notes (Signed)
Subjective:    Patient ID: [name], female    DOB: Nov 29, 1960, 56 y.o.   MRN: [id number]  HPI:  [name] is a 56 year old female who returns for follow up appointment and medication refill. She states her pain is located in her lower back and bilateral hips L>R. Also states she's experiencing increase intensity of pain during work hours due to excessive walking. She works as a Control and instrumentation engineer. We will increase her Hydrocodone dose, she was encouraged to follow Dr. Naaman Plummer recommendation regarding Tylenol do not exceed [phone] mg tylenol daily, she verbalizes understanding. She wasn't able to tolerate the Oxycodone due to side effect of increase frequency of palpitations. She rates her pain 8. Her current exercise regime is walking.    Pain Inventory Average Pain 9 Pain Right Now 8 My pain is constant, sharp and aching  In the last 24 hours, has pain interfered with the following? General activity 10 Relation with others 10 Enjoyment of life 10 What TIME of day is your pain at its worst? daytime Sleep (in general) .  Pain is worse with: walking, bending, standing and some activites Pain improves with: rest and medication Relief from Meds: 9  Mobility walk without assistance do you drive?  yes Do you have any goals in this area?  yes  Function employed # of hrs/week 40 I need assistance with the following:  meal prep, household duties and shopping  Neuro/Psych No problems in this area  Prior Studies Any changes since last visit?  no  Physicians involved in your care Any changes since last visit?  no   Family History  Problem Relation Age of Onset  . Hypertension Mother   . Alzheimer's disease Mother   . Diabetes Father   . Cancer Sister   . Anesthesia problems Neg Hx   . Hypotension Neg Hx   . Malignant hyperthermia Neg Hx   . Pseudochol deficiency Neg Hx    Social History   Social History  . Marital status: Divorced    Spouse  name: N/A  . Number of children: N/A  . Years of education: N/A   Social History Main Topics  . Smoking status: Never Smoker  . Smokeless tobacco: Never Used  . Alcohol use No  . Drug use: No  . Sexual activity: Yes   Other Topics Concern  . Not on file   Social History Narrative  . No narrative on file   Past Surgical History:  Procedure Laterality Date  . ANAL EXAMINATION UNDER ANESTHESIA  02/21/11   anal fistula  . BREAST SURGERY  patient does not remember date of procedure   pull fluid off lft br  . ELECTROPHYSIOLOGIC STUDY N/A 05/05/2015   Procedure: SVT Ablation;  Surgeon: Will Meredith Leeds, MD;  Location: [facility];  Service: Cardiovascular;  Laterality: N/A;  . EP IMPLANTABLE DEVICE N/A 01/30/2016   Procedure: Loop Recorder Insertion;  Surgeon: Sanda Klein, MD;  Location: [facility];  Service: Cardiovascular;  Laterality: N/A;  . INCISE AND DRAIN ABCESS     abscess on right thigh and buttock  . KNEE ARTHROSCOPY     left  . SHOULDER SURGERY  04/14/09   right   Past Medical History:  Diagnosis Date  . Abscess    increased drainage from abscess on buttock  . Anal fistula   . Anxiety   . Bilateral hip pain 05/27/2015  . Chronic pain syndrome 05/27/2015  . Depression  sees [name]  . Diabetes mellitus without complication ([place])   . Hypertension   . Sleep apnea    2008- sleep study, neg. for sleep apnea   . SVT (supraventricular tachycardia) ([place])    LMP 09/15/2014   Opioid Risk Score:   Fall Risk Score:  `1  Depression screen PHQ 2/9  Depression screen [facility] 2/9 02/22/2016 11/28/2015 10/26/2015 05/12/2015 09/28/2014 08/23/2014 06/23/2014  Decreased Interest 0 0 3 2 1  0 0  Down, Depressed, Hopeless 0 0 3 1 0 0 0  PHQ - 2 Score 0 0 6 3 1  0 0  Altered sleeping - - 3 3 - - -  Tired, decreased energy - - 3 1 - - -  Change in appetite - - 3 1 - - -  Feeling bad or failure about yourself  - - 3 0 - - -  Trouble concentrating - - 3 0 - - -    Moving slowly or fidgety/restless - - 0 0 - - -  Suicidal thoughts - - 0 0 - - -  PHQ-9 Score - - 21 8 - - -  Difficult doing work/chores - - Very difficult Somewhat difficult - - -   Review of Systems  Constitutional: Negative.   HENT: Negative.   Eyes: Negative.   Respiratory: Negative.   Cardiovascular: Negative.   Gastrointestinal: Negative.   Endocrine: Negative.   Genitourinary: Negative.   Musculoskeletal: Positive for back pain.  Skin: Negative.   Allergic/Immunologic: Negative.   Neurological: Negative.   Hematological: Negative.   Psychiatric/Behavioral: Negative.   All other systems reviewed and are negative.      Objective:   Physical Exam  Constitutional: She is oriented to person, place, and time. She appears well-developed and well-nourished.  HENT:  Head: Normocephalic and atraumatic.  Neck: Normal range of motion. Neck supple.  Cardiovascular: Normal rate and regular rhythm.   Pulmonary/Chest: Effort normal and breath sounds normal.  Musculoskeletal:  Normal Muscle Bulk and Muscle Testing Reveals: Upper Extremities: Full ROM and Muscle Strength 5/5 Lumbar Paraspinal Tenderness: L-3-L-5 Bilateral Greater Trochanteric Tenderness Lower Extremities: Full ROM and Muscle Strength 5/5 Arises from table with ease Narrow Based Gait   Neurological: She is alert and oriented to person, place, and time.  Skin: Skin is warm and dry.  Psychiatric: She has a normal mood and affect.  Nursing note and vitals reviewed.         Assessment & Plan:  1.Chronic Bilateral Hip pain L>R---endstage OA of hips. : Awaiting hip replacements.Continue HEP as tolerated and Heat and Ice Therapy Refilled:  Hydrocodone 10/325 one q6 hours as needed for moderate pain #120.  We will continue the opioid monitoring program, this consists of regular clinic visits, examinations, urine drug screen, pill counts as well as use of [place] Controlled Substance Reporting  System.-- 3. Morbid obesity: Continue with healthy diet Regime and HEP: She has lost 32 lbs. Encouraged to continue with healthy diet regime. . 20 minutes of face to face patient care time was spent during this visit. All questions were encouraged and answered.  F/U in 1 month

## 2016-04-09 NOTE — Telephone Encounter (Signed)
On January 29,2018 the [place] was reviewed no conflict was seen on the [place] with multiple prescribers. Ms. Spadea has a signed narcotic contract with our office. If there were any discrepancies this would have been reported to her physician.

## 2016-04-10 ENCOUNTER — Other Ambulatory Visit: Payer: Self-pay | Admitting: Medical

## 2016-04-16 ENCOUNTER — Encounter: Payer: Self-pay | Admitting: Medical

## 2016-04-16 ENCOUNTER — Ambulatory Visit (INDEPENDENT_AMBULATORY_CARE_PROVIDER_SITE_OTHER): Payer: BC Managed Care – PPO | Admitting: Medical

## 2016-04-16 VITALS — BP 122/80 | HR 84 | Wt 241.0 lb

## 2016-04-16 DIAGNOSIS — R112 Nausea with vomiting, unspecified: Secondary | ICD-10-CM

## 2016-04-16 DIAGNOSIS — R102 Pelvic and perineal pain: Secondary | ICD-10-CM | POA: Diagnosis not present

## 2016-04-16 DIAGNOSIS — R109 Unspecified abdominal pain: Secondary | ICD-10-CM

## 2016-04-16 LAB — CBC
HEMATOCRIT: 36.3 % (ref 35.0–45.0)
HEMOGLOBIN: 11.6 g/dL — AB (ref 11.7–15.5)
MCH: 24.9 pg — ABNORMAL LOW (ref 27.0–33.0)
MCHC: 32 g/dL (ref 32.0–36.0)
MCV: 78.1 fL — AB (ref 80.0–100.0)
MPV: 9.7 fL (ref 7.5–12.5)
Platelets: 488 10*3/uL — ABNORMAL HIGH (ref 140–400)
RBC: 4.65 MIL/uL (ref 3.80–5.10)
RDW: 14.7 % (ref 11.0–15.0)
WBC: 11 10*3/uL — ABNORMAL HIGH (ref 4.0–10.5)

## 2016-04-16 LAB — LIPASE: Lipase: 33 U/L (ref 7–60)

## 2016-04-16 LAB — COMPREHENSIVE METABOLIC PANEL
ALT: 12 U/L (ref 6–29)
AST: 11 U/L (ref 10–35)
Albumin: 3.8 g/dL (ref 3.6–5.1)
Alkaline Phosphatase: 78 U/L (ref 33–130)
BUN: 16 mg/dL (ref 7–25)
CHLORIDE: 97 mmol/L — AB (ref 98–110)
CO2: 31 mmol/L (ref 20–31)
CREATININE: 1.27 mg/dL — AB (ref 0.50–1.05)
Calcium: 9.9 mg/dL (ref 8.6–10.4)
Glucose, Bld: 137 mg/dL — ABNORMAL HIGH (ref 65–99)
Potassium: 3.8 mmol/L (ref 3.5–5.3)
SODIUM: 138 mmol/L (ref 135–146)
Total Bilirubin: 0.3 mg/dL (ref 0.2–1.2)
Total Protein: 6.6 g/dL (ref 6.1–8.1)

## 2016-04-16 MED ORDER — ONDANSETRON HCL 4 MG PO TABS: 4.0000 mg | ORAL_TABLET | Freq: Three times a day (TID) | ORAL | 0 refills | Status: DC | PRN

## 2016-04-16 NOTE — Progress Notes (Signed)
Subjective: Chief Complaint  Patient presents with  . stomach pain upper    upper stomach pain x 3 weeks    Here for c/o bad stomach pain x 3 weeks.  Worse right after eating within 30-60 minutes, epigastric but moves down and left or right.   Gets her nauseated, has had several episodes of vomiting in recent days.   Heavy meals make it worse.  Also having constipation.  Only having BMs if using the Amitiza.    Denies blood in stool, no loose stool, no fever, no body aches, no chills . Not drinking alcohol.  No chest pain.  Does get SOB related to palpitations, seeing cardiology for this.    Last colonoscopy in her 67s.  Had EGD same time.    Past Medical History:  Diagnosis Date  . Abscess    increased drainage from abscess on buttock  . Anal fistula   . Anxiety   . Bilateral hip pain 05/27/2015  . Chronic pain syndrome 05/27/2015  . Depression    sees [name]  . Diabetes mellitus without complication ([place])   . Hypertension   . Sleep apnea    2008- sleep study, neg. for sleep apnea   . SVT (supraventricular tachycardia) ([place])    Current Outpatient Prescriptions on File Prior to Visit  Medication Sig Dispense Refill  . DULoxetine (CYMBALTA) 60 MG capsule TAKE ONE CAPSULE BY MOUTH TWICE DAILY 60 capsule 2  . fluconazole (DIFLUCAN) 150 MG tablet 1 tablet every other day 3 tablet 0  . furosemide (LASIX) 40 MG tablet Take 1.5 tablets (60 mg total) by mouth daily. 270 tablet 3  . HYDROcodone-acetaminophen (NORCO) 10-325 MG tablet Take 1 tablet by mouth every 6 (six) hours as needed. 120 tablet 0  . Insulin Degludec-Liraglutide (XULTOPHY) 100-3.6 UNIT-MG/ML SOPN Inject 20 Units into the skin daily. 3 mL 5  . Insulin Degludec-Liraglutide (XULTOPHY) 100-3.6 UNIT-MG/ML SOPN Inject 1 pen into the skin every morning. 1 pen 0  . lubiprostone (AMITIZA) 24 MCG capsule TAKE 24 MCG BY MOUTH TWICE DAILY WITH MEALS 180 capsule 1  . metFORMIN (GLUCOPHAGE) 850 MG tablet Take 1 tablet (850 mg total) by  mouth 2 (two) times daily with a meal. 180 tablet 2  . metoprolol (LOPRESSOR) 50 MG tablet Take 1 tablet (50 mg total) by mouth 3 (three) times daily. 270 tablet 1  . omeprazole (PRILOSEC) 40 MG capsule TAKE ONE CAPSULE BY MOUTH TWICE DAILY 60 capsule 1  . potassium chloride (K-DUR) 10 MEQ tablet Take 1 tablet (10 mEq total) by mouth daily. 90 tablet 0   No current facility-administered medications on file prior to visit.    Past Surgical History:  Procedure Laterality Date  . ANAL EXAMINATION UNDER ANESTHESIA  02/21/11   anal fistula  . BREAST SURGERY  patient does not remember date of procedure   pull fluid off lft br  . ELECTROPHYSIOLOGIC STUDY N/A 05/05/2015   Procedure: SVT Ablation;  Surgeon: Will Meredith Leeds, MD;  Location: [facility];  Service: Cardiovascular;  Laterality: N/A;  . EP IMPLANTABLE DEVICE N/A 01/30/2016   Procedure: Loop Recorder Insertion;  Surgeon: Sanda Klein, MD;  Location: [facility];  Service: Cardiovascular;  Laterality: N/A;  . INCISE AND DRAIN ABCESS     abscess on right thigh and buttock  . KNEE ARTHROSCOPY     left  . SHOULDER SURGERY  04/14/09   right   ROS as in subjective   Objective: BP 122/80  Pulse 84   Wt 241 lb (109.3 kg)   LMP 09/15/2014   SpO2 98%   BMI 42.69 kg/m   General appearance: alert, no distress, WD/WN,obese Neck: supple, no lymphadenopathy, no thyromegaly, no masses Heart: RRR, normal S1, S2, no murmurs Lungs: CTA bilaterally, no wheezes, rhonchi, or rales Abdomen: +bs, soft, generalized tenderness, including lower abdominal tenderness, RUQ+ tenderness, non distended, no masses, no hepatomegaly, no splenomegaly Pulses: 2+ symmetric, upper and lower extremities, normal cap refill Ext: no edema No back tenderness    Assessment: Encounter Diagnoses  Name Primary?  . Abdominal pain, unspecified abdominal location Yes  . Pelvic pain   . Nausea and vomiting, intractability of vomiting not specified,  unspecified vomiting type     Plan: Discussed possible causes, suspect gall bladder disease, but can't rule out other.  Advised she cut out fatty foods, fried food, large portions, and we will get labs today.  Consider abdominal US.  Given hx/o ovarian cyst and lower abdominal tenderness, consider pelvic US.    [name] was seen today for stomach pain upper.  Diagnoses and all orders for this visit:  Abdominal pain, unspecified abdominal location -     Comprehensive metabolic panel -     CBC -     Lipase  Pelvic pain -     Comprehensive metabolic panel -     CBC -     Lipase  Nausea and vomiting, intractability of vomiting not specified, unspecified vomiting type -     Comprehensive metabolic panel -     CBC -     Lipase  Other orders -     ondansetron (ZOFRAN) 4 MG tablet; Take 1 tablet (4 mg total) by mouth every 8 (eight) hours as needed for nausea or vomiting.

## 2016-04-17 ENCOUNTER — Other Ambulatory Visit: Payer: Self-pay | Admitting: Medical

## 2016-04-17 DIAGNOSIS — R112 Nausea with vomiting, unspecified: Secondary | ICD-10-CM

## 2016-04-17 DIAGNOSIS — R7989 Other specified abnormal findings of blood chemistry: Secondary | ICD-10-CM

## 2016-04-17 DIAGNOSIS — R109 Unspecified abdominal pain: Secondary | ICD-10-CM

## 2016-04-17 DIAGNOSIS — R799 Abnormal finding of blood chemistry, unspecified: Secondary | ICD-10-CM

## 2016-04-17 LAB — CUP PACEART REMOTE DEVICE CHECK
Date Time Interrogation Session: 20171220183615
MDC IDC PG IMPLANT DT: 20171120

## 2016-04-18 ENCOUNTER — Telehealth: Payer: Self-pay | Admitting: Medical

## 2016-04-18 NOTE — Telephone Encounter (Signed)
Called [name] for samples & discount card.  Plan is to keep her in samples for about a  month and til she meets her deductible and then with discount card should be $30 a month. Pt informed

## 2016-04-18 NOTE — Telephone Encounter (Signed)
See telephone call 04/18/16

## 2016-04-24 ENCOUNTER — Ambulatory Visit: Payer: BC Managed Care – PPO | Admitting: Registered Nurse

## 2016-04-26 ENCOUNTER — Other Ambulatory Visit: Payer: Self-pay | Admitting: Medical

## 2016-04-26 ENCOUNTER — Ambulatory Visit
Admission: RE | Admit: 2016-04-26 | Discharge: 2016-04-26 | Disposition: A | Discharge status: Home / Self Care | Payer: BC Managed Care – PPO | Source: Ambulatory Visit | Attending: Medical | Admitting: Medical

## 2016-04-26 DIAGNOSIS — R109 Unspecified abdominal pain: Secondary | ICD-10-CM

## 2016-04-26 DIAGNOSIS — R112 Nausea with vomiting, unspecified: Secondary | ICD-10-CM

## 2016-04-26 DIAGNOSIS — R799 Abnormal finding of blood chemistry, unspecified: Secondary | ICD-10-CM

## 2016-04-26 DIAGNOSIS — R7989 Other specified abnormal findings of blood chemistry: Secondary | ICD-10-CM

## 2016-04-27 ENCOUNTER — Encounter: Payer: Self-pay | Admitting: Medical

## 2016-04-27 ENCOUNTER — Other Ambulatory Visit: Payer: Self-pay

## 2016-04-27 DIAGNOSIS — K8020 Calculus of gallbladder without cholecystitis without obstruction: Secondary | ICD-10-CM

## 2016-04-27 NOTE — Telephone Encounter (Signed)
Is this okay to refill? 

## 2016-04-30 ENCOUNTER — Ambulatory Visit (INDEPENDENT_AMBULATORY_CARE_PROVIDER_SITE_OTHER): Payer: BC Managed Care – PPO | Admitting: *Deleted

## 2016-04-30 DIAGNOSIS — R55 Syncope and collapse: Secondary | ICD-10-CM

## 2016-05-01 NOTE — Progress Notes (Signed)
[facility] Summary Report / Loop Recorder 

## 2016-05-02 ENCOUNTER — Encounter: Payer: Self-pay | Admitting: Medical

## 2016-05-03 ENCOUNTER — Encounter (HOSPITAL_COMMUNITY): Payer: BC Managed Care – PPO

## 2016-05-03 ENCOUNTER — Encounter: Payer: BC Managed Care – PPO | Attending: Physical Medicine & Rehabilitation | Admitting: Registered Nurse

## 2016-05-03 ENCOUNTER — Encounter: Payer: Self-pay | Admitting: Registered Nurse

## 2016-05-03 VITALS — BP 121/81 | HR 98

## 2016-05-03 DIAGNOSIS — E119 Type 2 diabetes mellitus without complications: Secondary | ICD-10-CM | POA: Diagnosis not present

## 2016-05-03 DIAGNOSIS — M25551 Pain in right hip: Secondary | ICD-10-CM | POA: Diagnosis not present

## 2016-05-03 DIAGNOSIS — M160 Bilateral primary osteoarthritis of hip: Secondary | ICD-10-CM | POA: Diagnosis not present

## 2016-05-03 DIAGNOSIS — Z833 Family history of diabetes mellitus: Secondary | ICD-10-CM | POA: Insufficient documentation

## 2016-05-03 DIAGNOSIS — G894 Chronic pain syndrome: Secondary | ICD-10-CM

## 2016-05-03 DIAGNOSIS — F418 Other specified anxiety disorders: Secondary | ICD-10-CM | POA: Insufficient documentation

## 2016-05-03 DIAGNOSIS — G8929 Other chronic pain: Secondary | ICD-10-CM | POA: Diagnosis present

## 2016-05-03 DIAGNOSIS — Z809 Family history of malignant neoplasm, unspecified: Secondary | ICD-10-CM | POA: Insufficient documentation

## 2016-05-03 DIAGNOSIS — Z79899 Other long term (current) drug therapy: Secondary | ICD-10-CM | POA: Diagnosis not present

## 2016-05-03 DIAGNOSIS — G4730 Sleep apnea, unspecified: Secondary | ICD-10-CM | POA: Insufficient documentation

## 2016-05-03 DIAGNOSIS — I10 Essential (primary) hypertension: Secondary | ICD-10-CM | POA: Insufficient documentation

## 2016-05-03 DIAGNOSIS — Z5181 Encounter for therapeutic drug level monitoring: Secondary | ICD-10-CM | POA: Diagnosis not present

## 2016-05-03 DIAGNOSIS — M25552 Pain in left hip: Secondary | ICD-10-CM

## 2016-05-03 DIAGNOSIS — Z8249 Family history of ischemic heart disease and other diseases of the circulatory system: Secondary | ICD-10-CM | POA: Diagnosis not present

## 2016-05-03 DIAGNOSIS — I471 Supraventricular tachycardia: Secondary | ICD-10-CM | POA: Diagnosis not present

## 2016-05-03 DIAGNOSIS — M5416 Radiculopathy, lumbar region: Secondary | ICD-10-CM

## 2016-05-03 LAB — CUP PACEART REMOTE DEVICE CHECK
Date Time Interrogation Session: 20180119191038
Implantable Pulse Generator Implant Date: 20171120

## 2016-05-03 MED ORDER — HYDROCODONE-ACETAMINOPHEN 10-325 MG PO TABS: 1.0000 | ORAL_TABLET | Freq: Four times a day (QID) | ORAL | 0 refills | Status: DC | PRN

## 2016-05-03 NOTE — Progress Notes (Addendum)
Subjective:    Patient ID: [name], female    DOB: 1960-06-25, 56 y.o.   MRN: [id number]  HPI: [name] is a 56 year old female who returns for follow up appointment and medication refill. She states her pain is located in her lower back radiating into her bilateral hips L>R and lower extremities posteriorly. She rates her pain 7. Her current exercise regime is walking.   Pain Inventory Average Pain 8 Pain Right Now 7 My pain is sharp, dull and aching  In the last 24 hours, has pain interfered with the following? General activity 9 Relation with others 7 Enjoyment of life 10 What TIME of day is your pain at its worst? daytime Sleep (in general) Fair  Pain is worse with: walking, bending, standing and some activites Pain improves with: rest and medication Relief from Meds: 5  Mobility walk without assistance ability to climb steps?  yes do you drive?  yes  Function employed # of hrs/week 40 I need assistance with the following:  meal prep, household duties and shopping  Neuro/Psych No problems in this area  Prior Studies Any changes since last visit?  yes  Physicians involved in your care Any changes since last visit?  yes   Family History  Problem Relation Age of Onset  . Hypertension Mother   . Alzheimer's disease Mother   . Diabetes Father   . Cancer Sister   . Anesthesia problems Neg Hx   . Hypotension Neg Hx   . Malignant hyperthermia Neg Hx   . Pseudochol deficiency Neg Hx    Social History   Social History  . Marital status: Divorced    Spouse name: N/A  . Number of children: N/A  . Years of education: N/A   Social History Main Topics  . Smoking status: Never Smoker  . Smokeless tobacco: Never Used  . Alcohol use No  . Drug use: No  . Sexual activity: Yes   Other Topics Concern  . Not on file   Social History Narrative  . No narrative on file   Past Surgical History:  Procedure Laterality Date  . ANAL  EXAMINATION UNDER ANESTHESIA  02/21/11   anal fistula  . BREAST SURGERY  patient does not remember date of procedure   pull fluid off lft br  . ELECTROPHYSIOLOGIC STUDY N/A 05/05/2015   Procedure: SVT Ablation;  Surgeon: Theresa Meredith Leeds, MD;  Location: [facility];  Service: Cardiovascular;  Laterality: N/A;  . EP IMPLANTABLE DEVICE N/A 01/30/2016   Procedure: Loop Recorder Insertion;  Surgeon: Theresa Klein, MD;  Location: [facility];  Service: Cardiovascular;  Laterality: N/A;  . INCISE AND DRAIN ABCESS     abscess on right thigh and buttock  . KNEE ARTHROSCOPY     left  . SHOULDER SURGERY  04/14/09   right   Past Medical History:  Diagnosis Date  . Abscess    increased drainage from abscess on buttock  . Anal fistula   . Anxiety   . Bilateral hip pain 05/27/2015  . Chronic pain syndrome 05/27/2015  . Depression    sees Dr. Barrie [name]  . Diabetes mellitus without complication ([place])   . Hypertension   . Sleep apnea    2008- sleep study, neg. for sleep apnea   . SVT (supraventricular tachycardia) ([place])    LMP 09/15/2014   Opioid Risk Score:   Fall Risk Score:  `1  Depression screen PHQ 2/9  Depression screen  [facility] 2/9 02/22/2016 11/28/2015 10/26/2015 05/12/2015 09/28/2014 08/23/2014 06/23/2014  Decreased Interest 0 0 3 2 1  0 0  Down, Depressed, Hopeless 0 0 3 1 0 0 0  PHQ - 2 Score 0 0 6 3 1  0 0  Altered sleeping - - 3 3 - - -  Tired, decreased energy - - 3 1 - - -  Change in appetite - - 3 1 - - -  Feeling bad or failure about yourself  - - 3 0 - - -  Trouble concentrating - - 3 0 - - -  Moving slowly or fidgety/restless - - 0 0 - - -  Suicidal thoughts - - 0 0 - - -  PHQ-9 Score - - 21 8 - - -  Difficult doing work/chores - - Very difficult Somewhat difficult - - -    Review of Systems  Constitutional: Negative.   HENT: Negative.   Eyes: Negative.   Respiratory: Negative.   Cardiovascular: Negative.   Gastrointestinal: Negative.   Endocrine: Negative.     Genitourinary: Negative.   Musculoskeletal: Positive for back pain.  Skin: Negative.   Allergic/Immunologic: Negative.   Neurological: Negative.   Hematological: Negative.   Psychiatric/Behavioral: Negative.   All other systems reviewed and are negative.      Objective:   Physical Exam  Constitutional: She is oriented to person, place, and time. She appears well-developed and well-nourished.  HENT:  Head: Normocephalic and atraumatic.  Neck: Normal range of motion. Neck supple.  Cardiovascular: Normal rate and regular rhythm.   Pulmonary/Chest: Effort normal and breath sounds normal.  Musculoskeletal:  Normal Muscle Bulk and Muscle Testing Reveals: Upper Extremities: Full ROM and Muscle Strength 5/5 Lumbar Paraspinal Tenderness: L-3-L-5 Left Greater Trochanteric Tenderness Lower Extremities: Full ROM and Muscle Strength 5/5 Arises from Table slowly Narrow Based Gait   Neurological: She is alert and oriented to person, place, and time.  Skin: Skin is warm and dry.  Psychiatric: She has a normal mood and affect.  Nursing note and vitals reviewed.         Assessment & Plan:  1.Chronic BilateralHip pain L>R---endstage OA of hips. : Awaiting hip replacements.Continue HEP as tolerated and Heat and Ice Therapy Refilled:  Hydrocodone 10/325 one q6 hours as needed for moderate pain #120. 05/03/2016 We Theresa continue the opioid monitoring program, this consists of regular clinic visits, examinations, urine drug screen, pill counts as well as use of [place] Controlled Substance Reporting System. 2. Lumbar Radiculopathy: Continue HEP and Continue to Monitor.-- 3. Morbid obesity: Continue with healthy diet Regime and HEP: She has lost 32 lbs. Encouraged to continue with healthy diet regime. 05/03/2016 . 59minutes of face to face patient care time wasspent during this visit. All questions were encouraged and answered.  F/U in  1 month

## 2016-05-04 ENCOUNTER — Ambulatory Visit (HOSPITAL_COMMUNITY)
Admission: RE | Admit: 2016-05-04 | Discharge: 2016-05-04 | Disposition: A | Discharge status: Home / Self Care | Payer: BC Managed Care – PPO | Source: Ambulatory Visit | Attending: Medical | Admitting: Medical

## 2016-05-04 DIAGNOSIS — K8020 Calculus of gallbladder without cholecystitis without obstruction: Secondary | ICD-10-CM | POA: Diagnosis not present

## 2016-05-04 MED ORDER — TECHNETIUM TC 99M MEBROFENIN IV KIT
5.0000 | PACK | Freq: Once | INTRAVENOUS | Status: AC | PRN
  Administered 2016-05-04: 5 via INTRAVENOUS

## 2016-05-07 ENCOUNTER — Encounter: Payer: Self-pay | Admitting: Medical

## 2016-05-07 ENCOUNTER — Other Ambulatory Visit: Payer: Self-pay

## 2016-05-07 ENCOUNTER — Telehealth: Payer: Self-pay | Admitting: Registered Nurse

## 2016-05-07 ENCOUNTER — Encounter: Payer: Self-pay | Admitting: Gastroenterology

## 2016-05-07 DIAGNOSIS — K8020 Calculus of gallbladder without cholecystitis without obstruction: Secondary | ICD-10-CM

## 2016-05-07 NOTE — Telephone Encounter (Signed)
On 05/07/2016 the [place] was reviewed no conflict was seen on the [place] with multiple prescribers. [name] has a signed narcotic contract with our office. If there were any discrepancies this would have been reported to her physician.

## 2016-05-08 NOTE — Telephone Encounter (Signed)
Pt called for samples Jon Billings gave her sample.  Called pt & left message, also left message that Louretta Shorten is setting her referral for GI & she can call ins company and they will tell her what she has met on her deductible.

## 2016-05-09 LAB — TOXASSURE SELECT,+ANTIDEPR,UR

## 2016-05-15 ENCOUNTER — Ambulatory Visit: Payer: BC Managed Care – PPO | Admitting: Gastroenterology

## 2016-05-15 ENCOUNTER — Other Ambulatory Visit: Payer: Self-pay | Admitting: Medical

## 2016-05-15 DIAGNOSIS — N1330 Unspecified hydronephrosis: Secondary | ICD-10-CM

## 2016-05-16 ENCOUNTER — Telehealth: Payer: Self-pay | Admitting: Physician Assistant

## 2016-05-16 ENCOUNTER — Encounter: Payer: Self-pay | Admitting: Physician Assistant

## 2016-05-16 ENCOUNTER — Ambulatory Visit (INDEPENDENT_AMBULATORY_CARE_PROVIDER_SITE_OTHER): Payer: BC Managed Care – PPO | Admitting: Physician Assistant

## 2016-05-16 VITALS — BP 140/78 | HR 82 | Ht 63.0 in | Wt 238.2 lb

## 2016-05-16 DIAGNOSIS — R1013 Epigastric pain: Secondary | ICD-10-CM

## 2016-05-16 DIAGNOSIS — K8020 Calculus of gallbladder without cholecystitis without obstruction: Secondary | ICD-10-CM | POA: Diagnosis not present

## 2016-05-16 DIAGNOSIS — R112 Nausea with vomiting, unspecified: Secondary | ICD-10-CM | POA: Diagnosis not present

## 2016-05-16 DIAGNOSIS — K760 Fatty (change of) liver, not elsewhere classified: Secondary | ICD-10-CM

## 2016-05-16 DIAGNOSIS — K219 Gastro-esophageal reflux disease without esophagitis: Secondary | ICD-10-CM

## 2016-05-16 MED ORDER — PANTOPRAZOLE SODIUM 40 MG PO TBEC: 40.0000 mg | DELAYED_RELEASE_TABLET | Freq: Two times a day (BID) | ORAL | 3 refills | Status: DC

## 2016-05-16 NOTE — Progress Notes (Addendum)
Chief Complaint: Gastric pain, nausea and vomiting, reflux  HPI:  [name] is a 56 year old African-American female with a past medical history of anxiety, chronic pain syndrome, depression, diabetes and hypertension as well as others listed below, who was referred to me by Carlena Hurl, PA-C for a complaint of epigastric pain, nausea and vomiting as well as reflux .      Patient had a recent ultrasound of the abdomen on 04/26/16 with findings of gallstones without sonographic evidence of acute cholecystitis, increased hepatic echotexture likely reflective of fatty infiltration. Mild left-sided hydronephrosis. 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 Finding on the right is stable. On the left not previouslymentioned. Patient then had a HIDA scan with [facility] on 05/04/16 which was normal.   Today, the patient tells me that since January she has had problems with epigastric pain which is associated with nausea and sometimes vomiting if she eats when she is nauseous. Patient tells me that back in November/December her Omeprazole 40 mg was increased to twice daily due to increasing reflux symptoms and then in January she started with epigastric pain. The patient tells me that this "comes and goes", uncertain times of day. When it starts it can last for hours or only last a few minutes. When this pain does start she starts with nausea and if she eats during that time, she will vomit. Patient can also have some "sharp pains", they can last a few minutes that "shoot through her abdomen". This is worse with spicy or fried foods and will start 20 min after eating. Nothing seems to make this pain any better. Patient notes a 5 pound weight loss since January without trying.   Patient has chronic constipation on opioids and uses Amitiza 24 mcg twice a day which gives her regular bowel movements.   Patient denies fever, chills, blood in her stool, melena, change  in diet, change in medications, fatigue, anorexia, dysphagia or symptoms that awaken her at night.  Past Medical History:  Diagnosis Date  . Abscess    increased drainage from abscess on buttock  . Anal fistula   . Anxiety   . Bilateral hip pain 05/27/2015  . Chronic pain syndrome 05/27/2015  . Depression    sees [name]  . Diabetes mellitus without complication ([place])   . Hypertension   . Sleep apnea    2008- sleep study, neg. for sleep apnea   . SVT (supraventricular tachycardia) (H[facility])     Past Surgical History:  Procedure Laterality Date  . ANAL EXAMINATION UNDER ANESTHESIA  02/21/11   anal fistula  . BREAST SURGERY  patient does not remember date of procedure   pull fluid off lft br  . ELECTROPHYSIOLOGIC STUDY N/A 05/05/2015   Procedure: SVT Ablation;  Surgeon: Will Meredith Leeds, MD;  Location: [facility];  Service: Cardiovascular;  Laterality: N/A;  . EP IMPLANTABLE DEVICE N/A 01/30/2016   Procedure: Loop Recorder Insertion;  Surgeon: Sanda Klein, MD;  Location: [facility];  Service: Cardiovascular;  Laterality: N/A;  . INCISE AND DRAIN ABCESS     abscess on right thigh and buttock  . KNEE ARTHROSCOPY     left  . SHOULDER SURGERY  04/14/09   right    Current Outpatient Prescriptions  Medication Sig Dispense Refill  . citalopram (CELEXA) 20 MG tablet TAKE ONE TABLET BY MOUTH ONCE DAILY 30 tablet 2  . DULoxetine (CYMBALTA) 60 MG capsule TAKE ONE CAPSULE BY  MOUTH TWICE DAILY 60 capsule 2  . fluconazole (DIFLUCAN) 150 MG tablet 1 tablet every other day 3 tablet 0  . furosemide (LASIX) 40 MG tablet Take 1.5 tablets (60 mg total) by mouth daily. 270 tablet 3  . HYDROcodone-acetaminophen (NORCO) 10-325 MG tablet Take 1 tablet by mouth every 6 (six) hours as needed. 120 tablet 0  . Insulin Degludec-Liraglutide (XULTOPHY) 100-3.6 UNIT-MG/ML SOPN Inject 20 Units into the skin daily. 3 mL 5  . Insulin Degludec-Liraglutide (XULTOPHY) 100-3.6 UNIT-MG/ML SOPN  Inject 1 pen into the skin every morning. 1 pen 0  . lubiprostone (AMITIZA) 24 MCG capsule TAKE 24 MCG BY MOUTH TWICE DAILY WITH MEALS 180 capsule 1  . metFORMIN (GLUCOPHAGE) 850 MG tablet Take 1 tablet (850 mg total) by mouth 2 (two) times daily with a meal. 180 tablet 2  . metoprolol (LOPRESSOR) 50 MG tablet Take 1 tablet (50 mg total) by mouth 3 (three) times daily. 270 tablet 1  . omeprazole (PRILOSEC) 40 MG capsule TAKE ONE CAPSULE BY MOUTH TWICE DAILY 60 capsule 1  . ondansetron (ZOFRAN) 4 MG tablet Take 1 tablet (4 mg total) by mouth every 8 (eight) hours as needed for nausea or vomiting. 20 tablet 0  . potassium chloride (K-DUR) 10 MEQ tablet Take 1 tablet (10 mEq total) by mouth daily. 90 tablet 0  . pantoprazole (PROTONIX) 40 MG tablet Take 1 tablet (40 mg total) by mouth 2 (two) times daily before a meal. Take 1 tablet 30-60 minutes before breakfast and dinner 60 tablet 3   No current facility-administered medications for this visit.     Allergies as of 05/16/2016 - Review Complete 05/16/2016  Allergen Reaction Noted  . Tramadol Nausea Only 10/30/2010    Family History  Problem Relation Age of Onset  . Hypertension Mother   . Alzheimer's disease Mother   . Diabetes Father   . Breast cancer Sister 62  . Anesthesia problems Neg Hx   . Hypotension Neg Hx   . Malignant hyperthermia Neg Hx   . Pseudochol deficiency Neg Hx   . Colon cancer Neg Hx     Social History   Social History  . Marital status: Divorced    Spouse name: N/A  . Number of children: 0  . Years of education: N/A   Occupational History  . TEACHER ASSISTANT    Social History Main Topics  . Smoking status: Never Smoker  . Smokeless tobacco: Never Used  . Alcohol use No  . Drug use: No  . Sexual activity: Yes   Other Topics Concern  . Not on file   Social History Narrative  . No narrative on file    Review of Systems:    Constitutional: No weight loss, fever or chills Skin: No  rash Cardiovascular: No chest pain Respiratory: Positive for shortness of breath Gastrointestinal: See HPI and otherwise negative Genitourinary: No dysuria or change in urinary frequency Neurological: No headache, dizziness or syncope Musculoskeletal: Positive for arthritis and back pain Hematologic: No bleeding or bruising Psychiatric: Positive for depression   Physical Exam:  Vital signs: BP 140/78   Pulse 82   Ht 5\' 3"  (1.6 m)   Wt 238 lb 3.2 oz (108 kg)   LMP 09/15/2014   BMI 42.20 kg/m   Constitutional:   Pleasant obese African American female appears to be in NAD, Well developed, Well nourished, alert and cooperative Head:  Normocephalic and atraumatic. Eyes:   PEERL, EOMI. No icterus. Conjunctiva pink. Ears:  Normal  auditory acuity. Neck:  Supple Throat: Oral cavity and pharynx without inflammation, swelling or lesion.  Respiratory: Respirations even and unlabored. Lungs clear to auscultation bilaterally.   No wheezes, crackles, or rhonchi.  Cardiovascular: Normal S1, S2. No MRG. Regular rate and rhythm. No peripheral edema, cyanosis or pallor.  Gastrointestinal:  Soft, nondistended, Moderate epigastric ttp to only light palpation, mild RUQ ttpNo rebound or guarding. Normal bowel sounds. No appreciable masses or hepatomegaly. Rectal:  Not performed.  Msk:  Symmetrical without gross deformities. Without edema, no deformity or joint abnormality.  Neurologic:  Alert and  oriented x4;  grossly normal neurologically.  Skin:   Dry and intact without significant lesions or rashes. Psychiatric:  Demonstrates good judgement and reason without abnormal affect or behaviors.  MOST RECENT LABS AND IMAGING: CBC    Component Value Date/Time   WBC 11.0 (H) 04/16/2016 1627   RBC 4.65 04/16/2016 1627   HGB 11.6 (L) 04/16/2016 1627   HCT 36.3 04/16/2016 1627   PLT 488 (H) 04/16/2016 1627   MCV 78.1 (L) 04/16/2016 1627   MCH 24.9 (L) 04/16/2016 1627   MCHC 32.0 04/16/2016 1627   RDW  14.7 04/16/2016 1627   LYMPHSABS 2,254 10/26/2015 1057   MONOABS 588 10/26/2015 1057   EOSABS 196 10/26/2015 1057   BASOSABS 98 10/26/2015 1057    CMP     Component Value Date/Time   NA 138 04/16/2016 1627   K 3.8 04/16/2016 1627   CL 97 (L) 04/16/2016 1627   CO2 31 04/16/2016 1627   GLUCOSE 137 (H) 04/16/2016 1627   BUN 16 04/16/2016 1627   CREATININE 1.27 (H) 04/16/2016 1627   CALCIUM 9.9 04/16/2016 1627   PROT 6.6 04/16/2016 1627   ALBUMIN 3.8 04/16/2016 1627   AST 11 04/16/2016 1627   ALT 12 04/16/2016 1627   ALKPHOS 78 04/16/2016 1627   BILITOT 0.3 04/16/2016 1627   GFRNONAA 54 (L) 01/29/2016 1846   GFRNONAA 48 (L) 08/23/2014 1740   GFRAA >60 01/29/2016 1846   GFRAA 55 (L) 08/23/2014 1740    Ref Range & Units 78mo ago  Lipase 7 - 60 U/L 33   Resulting Agency  [facility]     ABDOMEN ULTRASOUND COMPLETE 04/26/16  COMPARISON:  Abdominal ultrasound of August 24, 2014  FINDINGS: Gallbladder: The gallbladder is adequately distended. There are echogenic stones floating in the bile pool with the largest measuring 8 mm in diameter. There is no gallbladder wall thickening, pericholecystic fluid, or positive sonographic Murphy's sign.  Common bile duct: Diameter: 2 mm  Liver: The hepatic echotexture is mildly increased and is heterogeneous. There is no discrete mass or ductal dilation. The surface contour of the liver is smooth.  IVC: Bowel gas limits evaluation of the IVC.  Pancreas: The pancreas is normal in echotexture and contour.  Spleen: Size and appearance within normal limits.  Right Kidney: Length: 12.1 cm. Echogenicity within normal limits. There is a hyperechoic focus within the midpole cortex of the right kidney measuring 1.6 x 1 x 1.3 cm that is solid in appearance. There is no hydronephrosis.  Left Kidney: Length: 11.7 cm. There is mild hydronephrosis. The renal cortical echotexture is normal. In the lower pole there is a 9 mm diameter solid  appearing hyperechoic focus similar to that on the right.  Abdominal aorta: Limited visualization due to bowel gas.  Other findings: There is no ascites.  IMPRESSION: Gallstones without sonographic evidence of acute cholecystitis.  Increased hepatic echotexture likely reflects fatty infiltrative change.  Mild left-sided hydronephrosis. 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 The finding on the right is stable. On the left the hyperechoic focus was not described previously.   Electronically Signed   By: David  Martinique M.D.   On: 04/26/2016 10:15   NUCLEAR MEDICINE HEPATOBILIARY IMAGING WITH GALLBLADDER EF 05/04/16  TECHNIQUE: Sequential images of the abdomen were obtained out to 60 minutes following intravenous administration of radiopharmaceutical. After oral ingestion of Ensure, gallbladder ejection fraction was determined. At 60 min, normal ejection fraction is greater than 33%.  RADIOPHARMACEUTICALS:  4.8 mCi Tc-45m  Choletec IV  COMPARISON:  None  FINDINGS: Normal tracer extraction from bloodstream indicating normal hepatocellular function.  Normal excretion of tracer into biliary tree.  Gallbladder visualized at 8 min.  Small bowel visualized at 42 min.  No hepatic retention of tracer.  Subjectively borderline low emptying of tracer from gallbladder following fatty meal stimulation.  Calculated gallbladder ejection fraction is 36%, within the normal range.  Patient reported no symptoms following Ensure ingestion.  Normal gallbladder ejection fraction following Ensure ingestion is greater than 33% at 1 hour.  IMPRESSION: Normal exam.   Electronically Signed   By: Lavonia Dana M.D.   On: 05/04/2016 10:03  Assessment: 1. Epigastric pain: Over the past 3 months, no help from increase in Omeprazole to 40 mg twice a day, recent ultrasound and HIDA scan show  cholelithiasis but normal gallbladder function and no cholecystitis; likely gastritis versus PUD versus H. pylori versus other 2. Cholelithiasis: Seen at time of recent ultrasound, no cholecystitis, we did discuss finding 3. GERD: uncontrolled on Omeprazole 40 mg twice a day which was increased back in November/December 2017 4. Nausea/ vomiting: With epigastric pain 5. Fatty liver: Seen at time of recent ultrasound, liver enzymes normal  Plan: 1. Scheduled patient for an EGD with Dr. Hilarie Fredrickson, as he is the supervising physician this morning, in the [facility]. We did discuss risks, benefits, limitations and alternativesand the patient agrees to proceed. 2. Stopped patient's Omeprazole 40 twice a day and started Pantoprazole 40 mg twice a day, 30-60 minutes before breakfast and dinner. 3. Reviewed antireflux diet and lifestyle modifications, provided the patient with a handout 4. Patient to return to clinic per Dr. Bradly Bienenstock recommendation after time of procedure  Theresa Newer, PA-C [facility] 05/16/2016, 9:41 AM  Cc: Carlena Hurl, PA-C   Addendum: Reviewed and agree with initial management. Theresa Bears, MD

## 2016-05-16 NOTE — Patient Instructions (Addendum)
If you are age 56 or older, your body mass index should be between 23-30. Your Body mass index is 42.2 kg/m. If this is out of the aforementioned range listed, please consider follow up with your Primary Care Provider.  If you are age 51 or younger, your body mass index should be between 19-25. Your Body mass index is 42.2 kg/m. If this is out of the aformentioned range listed, please consider follow up with your Primary Care Provider.   We have sent the following medications to your pharmacy for you to pick up at your convenience:  Pantoprazole  Please discontinue the Omeprazole.  You have been scheduled for an endoscopy. Please follow written instructions given to you at your visit today. If you use inhalers (even only as needed), please bring them with you on the day of your procedure. Your physician has requested that you go to www.startemmi.com and enter the access code given to you at your visit today. This web site gives a general overview about your procedure. However, you should still follow specific instructions given to you by our office regarding your preparation for the procedure.  You have been given a anti-reflux handout.  Thank you.

## 2016-05-16 NOTE — Telephone Encounter (Signed)
Left message on machine to call back  

## 2016-05-17 ENCOUNTER — Ambulatory Visit (AMBULATORY_SURGERY_CENTER): Payer: BC Managed Care – PPO | Admitting: Internal Medicine

## 2016-05-17 ENCOUNTER — Telehealth: Payer: Self-pay | Admitting: *Deleted

## 2016-05-17 ENCOUNTER — Encounter: Payer: Self-pay | Admitting: Internal Medicine

## 2016-05-17 VITALS — BP 135/89 | HR 82 | Temp 97.3°F | Resp 19 | Ht 63.0 in | Wt 238.0 lb

## 2016-05-17 DIAGNOSIS — K2990 Gastroduodenitis, unspecified, without bleeding: Secondary | ICD-10-CM

## 2016-05-17 DIAGNOSIS — R1013 Epigastric pain: Secondary | ICD-10-CM | POA: Diagnosis not present

## 2016-05-17 DIAGNOSIS — K2970 Gastritis, unspecified, without bleeding: Secondary | ICD-10-CM | POA: Diagnosis not present

## 2016-05-17 DIAGNOSIS — K317 Polyp of stomach and duodenum: Secondary | ICD-10-CM

## 2016-05-17 DIAGNOSIS — K3189 Other diseases of stomach and duodenum: Secondary | ICD-10-CM | POA: Diagnosis not present

## 2016-05-17 MED ORDER — SODIUM CHLORIDE 0.9 % IV SOLN: 500.0000 mL | INTRAVENOUS | Status: DC

## 2016-05-17 NOTE — Progress Notes (Signed)
A/ox3 pleased with MAC, report to [place], [place]

## 2016-05-17 NOTE — Progress Notes (Signed)
No problems noted in the recovery room. maw 

## 2016-05-17 NOTE — Op Note (Signed)
[facility] Patient Name: [name] Procedure Date: 05/17/2016 9:42 AM MRN: [id number] Endoscopist: Jerene Bears , MD Age: 56 Referring MD:  Date of Birth: 1960/06/05 Gender: Female Account #: [id number] Procedure:                Upper GI endoscopy Indications:              Epigastric abdominal pain, Nausea with vomiting Medicines:                Monitored Anesthesia Care Procedure:                Pre-Anesthesia Assessment:                           - Prior to the procedure, a History and Physical                            was performed, and patient medications and                            allergies were reviewed. The patient's tolerance of                            previous anesthesia was also reviewed. The risks                            and benefits of the procedure and the sedation                            options and risks were discussed with the patient.                            All questions were answered, and informed consent                            was obtained. Prior Anticoagulants: The patient has                            taken no previous anticoagulant or antiplatelet                            agents. ASA Grade Assessment: III - A patient with                            severe systemic disease. After reviewing the risks                            and benefits, the patient was deemed in                            satisfactory condition to undergo the procedure.                           After obtaining informed consent, the endoscope was  passed under direct vision. Throughout the                            procedure, the patient's blood pressure, pulse, and                            oxygen saturations were monitored continuously. The                            Endoscope was introduced through the mouth, and                            advanced to the second part of duodenum. The upper                            GI  endoscopy was accomplished without difficulty.                            The patient tolerated the procedure well. Scope In: Scope Out: Findings:                 The examined esophagus was normal.                           A 3 cm hiatal hernia was present.                           A single 6 mm sessile polyp was found on the                            greater curvature of the gastric antrum. The polyp                            was removed with a hot snare. Resection and                            retrieval were complete.                           Patchy mild to moderate inflammation characterized                            by erythema and granularity was found in the                            gastric body, at the incisura and in the gastric                            antrum (appearance suspicious for gastric                            metaplasia). Biopsies were taken with a cold  forceps for histology and Helicobacter pylori                            testing from the gastric body, antrum and incisura.                           The examined duodenum was normal. Complications:            No immediate complications. Estimated Blood Loss:     Estimated blood loss was minimal. Impression:               - Normal esophagus.                           - 3 cm hiatal hernia.                           - A single gastric polyp. Resected and retrieved.                           - Gastritis. Biopsied.                           - Normal examined duodenum. Recommendation:           - Patient has a contact number available for                            emergencies. The signs and symptoms of potential                            delayed complications were discussed with the                            patient. Return to normal activities tomorrow.                            Written discharge instructions were provided to the                            patient.                            - Resume previous diet.                           - Continue present medications.                           - No aspirin, ibuprofen, naproxen, or other                            non-steroidal anti-inflammatory drugs for 3 weeks                            after polyp removal.                           -  Await pathology results.                           - If pathology results unrevealing (no H pylori                            infection) and symptoms persist would recommend                            gastric emptying study and if negative surgical                            consultation for symptomatic cholelithiasis. Jerene Bears, MD 05/17/2016 10:05:31 AM This report has been signed electronically.

## 2016-05-17 NOTE — Telephone Encounter (Signed)
-----   Message from Larina Bras, [place] sent at 05/17/2016  4:06 PM EST ----- ----- Message -----  From: Jerene Bears, MD  Sent: 05/17/2016 10:16 AM  To: Larina Bras, CMA  Subject: colon                       Having eval of epigastric pain, more to come to eval that  Never had screening colon  Please place recall for July 2018 so that we can get that scheduled (once we complete eval of more acute symptoms)  I discussed this with her after EGD today and she is agreeable and wants to pursue colon screening with colonoscopy  Thanks  JMP

## 2016-05-17 NOTE — Patient Instructions (Addendum)
YOU HAD AN ENDOSCOPIC PROCEDURE TODAY AT [facility]:   Refer to the procedure report that was given to you for any specific questions about what was found during the examination.  If the procedure report does not answer your questions, please call your gastroenterologist to clarify.  If you requested that your care partner not be given the details of your procedure findings, then the procedure report has been included in a sealed envelope for you to review at your convenience later.  YOU SHOULD EXPECT: Some feelings of bloating in the abdomen. Passage of more gas than usual.  Walking can help get rid of the air that was put into your GI tract during the procedure and reduce the bloating. If you had a lower endoscopy (such as a colonoscopy or flexible sigmoidoscopy) you may notice spotting of blood in your stool or on the toilet paper. If you underwent a bowel prep for your procedure, you may not have a normal bowel movement for a few days.  Please Note:  You might notice some irritation and congestion in your nose or some drainage.  This is from the oxygen used during your procedure.  There is no need for concern and it should clear up in a day or so.  SYMPTOMS TO REPORT IMMEDIATELY:    Following upper endoscopy (EGD)  Vomiting of blood or coffee ground material  New chest pain or pain under the shoulder blades  Painful or persistently difficult swallowing  New shortness of breath  Fever of 100F or higher  Black, tarry-looking stools  For urgent or emergent issues, a gastroenterologist can be reached at any hour by calling [phone].   DIET:  We do recommend a small meal at first, but then you may proceed to your regular diet.  Drink plenty of fluids but you should avoid alcoholic beverages for 24 hours.  ACTIVITY:  You should plan to take it easy for the rest of today and you should NOT DRIVE or use heavy machinery until tomorrow (because of the sedation medicines used  during the test).    FOLLOW UP: Our staff will call the number listed on your records the next business day following your procedure to check on you and address any questions or concerns that you may have regarding the information given to you following your procedure. If we do not reach you, we will leave a message.  However, if you are feeling well and you are not experiencing any problems, there is no need to return our call.  We will assume that you have returned to your regular daily activities without incident.  If any biopsies were taken you will be contacted by phone or by letter within the next 1-3 weeks.  Please call us at [phone] if you have not heard about the biopsies in 3 weeks.    SIGNATURES/CONFIDENTIALITY: You and/or your care partner have signed paperwork which will be entered into your electronic medical record.  These signatures attest to the fact that that the information above on your After Visit Summary has been reviewed and is understood.  Full responsibility of the confidentiality of this discharge information lies with you and/or your care-partner.     Handouts were given to your care partner on a hiatal hernia and gastritis. No aspirin, aspirin products,  ibuprofen, naproxen, advil, motrin, aleve, or other non-steroidal anti-inflammatory drugs for 21 days after polyp removal. Your blood sugar was 163 in the recovery room. You may resume your  other current medications today. Await biopsy results. Please call if any questions or concerns.

## 2016-05-17 NOTE — Telephone Encounter (Signed)
Recall placed in EPIC for 09/2016.

## 2016-05-17 NOTE — Progress Notes (Signed)
Dental advisory given to patient 

## 2016-05-17 NOTE — Progress Notes (Signed)
No change in health history since yesterday. SM

## 2016-05-17 NOTE — Telephone Encounter (Signed)
Left message on machine to call back will wait for the pt to return call if she continues to have concerns

## 2016-05-17 NOTE — Progress Notes (Signed)
Called to room to assist during endoscopic procedure.  Patient ID and intended procedure confirmed with present staff. Received instructions for my participation in the procedure from the performing physician.  

## 2016-05-18 ENCOUNTER — Encounter: Payer: Self-pay | Admitting: Medical

## 2016-05-18 ENCOUNTER — Telehealth: Payer: Self-pay

## 2016-05-18 NOTE — Telephone Encounter (Signed)
  Follow up Call-  Call back number 05/17/2016  Post procedure Call Back phone  # [phone]  Permission to leave phone message Yes  Some recent data might be hidden     Patient questions:  Do you have a fever, pain , or abdominal swelling? No. Pain Score  0 *  Have you tolerated food without any problems? Yes.    Have you been able to return to your normal activities? No.  Pt. Reports she is experiencing a bit of nausea and stayed out of work today, but feels she is recovering fine from her procedure yesterday.  Do you have any questions about your discharge instructions: Diet   No. Medications  No. Follow up visit  No. 2 Do you have questions or concerns about your Care? No.  Actions: * If pain score is 4 or above: No action needed, pain <4.

## 2016-05-18 NOTE — Telephone Encounter (Signed)
  Follow up Call-  Call back number 05/17/2016  Post procedure Call Back phone  # [phone]  Permission to leave phone message Yes  Some recent data might be hidden    Patient was called for follow up after her procedure on 05/17/16. No answer at the number given for follow up phone call. A message was left on voice mail.

## 2016-05-21 LAB — CUP PACEART REMOTE DEVICE CHECK
MDC IDC PG IMPLANT DT: 20171120
MDC IDC SESS DTM: 20180218190957

## 2016-05-21 NOTE — Progress Notes (Signed)
Carelink summary report received. Battery status OK. Normal device function. No new symptom episodes, tachy episodes, brady, or pause episodes. No new AF episodes. Monthly summary reports and ROV/PRN 

## 2016-05-22 ENCOUNTER — Telehealth: Payer: Self-pay | Admitting: Internal Medicine

## 2016-05-22 ENCOUNTER — Ambulatory Visit: Payer: BC Managed Care – PPO | Admitting: Gastroenterology

## 2016-05-22 ENCOUNTER — Other Ambulatory Visit: Payer: Self-pay

## 2016-05-22 MED ORDER — SUCRALFATE 1 GM/10ML PO SUSP: 1.0000 g | Freq: Four times a day (QID) | ORAL | 1 refills | Status: DC

## 2016-05-22 NOTE — Telephone Encounter (Signed)
Discussed with pt that her biopsy results are not back yet. Pt states she is still having a lot of pain under her breast and the top part of her stomach is swollen. Pt wants to know what she can do for the pain. Please advise.

## 2016-05-22 NOTE — Telephone Encounter (Signed)
Continue twice a day PPI before meals Add Carafate 1 g before meals and at bedtime, every 6 hours if not eating 3 meals daily Hopefully pathology will be back by tomorrow, suspicious for H. pylori

## 2016-05-22 NOTE — Telephone Encounter (Signed)
Spoke with pt and she is aware, script sent to pharmacy for carafate.

## 2016-05-23 ENCOUNTER — Other Ambulatory Visit: Payer: Self-pay

## 2016-05-23 DIAGNOSIS — R109 Unspecified abdominal pain: Secondary | ICD-10-CM

## 2016-05-23 DIAGNOSIS — R1033 Periumbilical pain: Secondary | ICD-10-CM

## 2016-05-28 ENCOUNTER — Telehealth: Payer: Self-pay | Admitting: Medical

## 2016-05-28 NOTE — Telephone Encounter (Signed)
Pt requesting samples of her insulin since her out of pocket coinsur/copay for prescriptions has not been meet which is making this med too expensive right now

## 2016-05-29 ENCOUNTER — Ambulatory Visit (INDEPENDENT_AMBULATORY_CARE_PROVIDER_SITE_OTHER): Payer: BC Managed Care – PPO | Admitting: *Deleted

## 2016-05-29 ENCOUNTER — Telehealth: Payer: Self-pay | Admitting: Gastroenterology

## 2016-05-29 DIAGNOSIS — R55 Syncope and collapse: Secondary | ICD-10-CM | POA: Diagnosis not present

## 2016-05-29 MED ORDER — SUCRALFATE 1 G PO TABS: 1.0000 g | ORAL_TABLET | Freq: Three times a day (TID) | ORAL | 1 refills | Status: DC

## 2016-05-29 NOTE — Telephone Encounter (Signed)
Gave her a  Sample until her meds come in on Friday

## 2016-05-29 NOTE — Telephone Encounter (Signed)
Rx sent 

## 2016-05-30 ENCOUNTER — Ambulatory Visit (INDEPENDENT_AMBULATORY_CARE_PROVIDER_SITE_OTHER)
Admission: RE | Admit: 2016-05-30 | Discharge: 2016-05-30 | Disposition: A | Discharge status: Home / Self Care | Payer: BC Managed Care – PPO | Source: Ambulatory Visit | Attending: Internal Medicine | Admitting: Internal Medicine

## 2016-05-30 DIAGNOSIS — R1033 Periumbilical pain: Secondary | ICD-10-CM

## 2016-05-30 MED ORDER — IOPAMIDOL (ISOVUE-300) INJECTION 61%
100.0000 mL | Freq: Once | INTRAVENOUS | Status: AC | PRN
  Administered 2016-05-30: 100 mL via INTRAVENOUS

## 2016-05-30 NOTE — Progress Notes (Signed)
[facility] Summary Report / Loop Recorder 

## 2016-05-31 ENCOUNTER — Encounter: Payer: BC Managed Care – PPO | Attending: Physical Medicine & Rehabilitation | Admitting: Registered Nurse

## 2016-05-31 VITALS — BP 123/74 | HR 89

## 2016-05-31 DIAGNOSIS — Z79899 Other long term (current) drug therapy: Secondary | ICD-10-CM | POA: Diagnosis not present

## 2016-05-31 DIAGNOSIS — M25551 Pain in right hip: Secondary | ICD-10-CM

## 2016-05-31 DIAGNOSIS — G8929 Other chronic pain: Secondary | ICD-10-CM | POA: Diagnosis not present

## 2016-05-31 DIAGNOSIS — Z833 Family history of diabetes mellitus: Secondary | ICD-10-CM | POA: Diagnosis not present

## 2016-05-31 DIAGNOSIS — M5416 Radiculopathy, lumbar region: Secondary | ICD-10-CM

## 2016-05-31 DIAGNOSIS — E119 Type 2 diabetes mellitus without complications: Secondary | ICD-10-CM | POA: Insufficient documentation

## 2016-05-31 DIAGNOSIS — Z809 Family history of malignant neoplasm, unspecified: Secondary | ICD-10-CM | POA: Diagnosis not present

## 2016-05-31 DIAGNOSIS — I10 Essential (primary) hypertension: Secondary | ICD-10-CM | POA: Diagnosis not present

## 2016-05-31 DIAGNOSIS — R296 Repeated falls: Secondary | ICD-10-CM

## 2016-05-31 DIAGNOSIS — Z5181 Encounter for therapeutic drug level monitoring: Secondary | ICD-10-CM

## 2016-05-31 DIAGNOSIS — M25552 Pain in left hip: Secondary | ICD-10-CM

## 2016-05-31 DIAGNOSIS — G4730 Sleep apnea, unspecified: Secondary | ICD-10-CM | POA: Diagnosis not present

## 2016-05-31 DIAGNOSIS — I471 Supraventricular tachycardia: Secondary | ICD-10-CM | POA: Diagnosis not present

## 2016-05-31 DIAGNOSIS — G894 Chronic pain syndrome: Secondary | ICD-10-CM

## 2016-05-31 DIAGNOSIS — Z8249 Family history of ischemic heart disease and other diseases of the circulatory system: Secondary | ICD-10-CM | POA: Diagnosis not present

## 2016-05-31 DIAGNOSIS — F418 Other specified anxiety disorders: Secondary | ICD-10-CM | POA: Diagnosis not present

## 2016-05-31 MED ORDER — HYDROCODONE-ACETAMINOPHEN 10-325 MG PO TABS: 1.0000 | ORAL_TABLET | Freq: Four times a day (QID) | ORAL | 0 refills | Status: DC | PRN

## 2016-05-31 NOTE — Progress Notes (Signed)
Subjective:    Patient ID: [name], female    DOB: Jul 11, 1960, 56 y.o.   MRN: [id number]  HPI: [name] is a 56 year old female who returns for follow up appointment and medication refill. She states her pain is located in her lower back radiating into herbilateral hips L>R and lower extremities posteriorly. She rates her pain 6. Her current exercise regime is walking.  Also states she has fallen three times, since last visit. Three weeks ago she was carrying groceries and lost balance and landed on the floor, she was able to pick herself up. Two weeks ago she was walking into her kitchen and lost her balance and landed on her living room floor, she was able to pick herself up.  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 Her co-workers helped her up. Referral sent to Physical Therapy and prescription given for a cane she verbalizes understanding.    Pain Inventory Average Pain 7 Pain Right Now 6 My pain is sharp, dull and stabbing  In the last 24 hours, has pain interfered with the following? General activity 9 Relation with others 9 Enjoyment of life 9 What TIME of day is your pain at its worst? daytime Sleep (in general) Fair  Pain is worse with: walking, bending and standing Pain improves with: rest and medication Relief from Meds: 6  Mobility walk without assistance ability to climb steps?  yes do you drive?  yes  Function employed # of hrs/week 40 I need assistance with the following:  meal prep, household duties and shopping  Neuro/Psych tingling depression anxiety  Prior Studies Any changes since last visit?  no  Physicians involved in your care Any changes since last visit?  no   Family History  Problem Relation Age of Onset  . Hypertension Mother   . Alzheimer's disease Mother   . Diabetes Father   . Breast cancer Sister 45  . Anesthesia problems Neg Hx   .  Hypotension Neg Hx   . Malignant hyperthermia Neg Hx   . Pseudochol deficiency Neg Hx   . Colon cancer Neg Hx    Social History   Social History  . Marital status: Divorced    Spouse name: N/A  . Number of children: 0  . Years of education: N/A   Occupational History  . TEACHER ASSISTANT    Social History Main Topics  . Smoking status: Never Smoker  . Smokeless tobacco: Never Used  . Alcohol use No  . Drug use: No  . Sexual activity: Yes   Other Topics Concern  . Not on file   Social History Narrative  . No narrative on file   Past Surgical History:  Procedure Laterality Date  . ANAL EXAMINATION UNDER ANESTHESIA  02/21/11   anal fistula  . BREAST SURGERY  patient does not remember date of procedure   pull fluid off lft br  . ELECTROPHYSIOLOGIC STUDY N/A 05/05/2015   Procedure: SVT Ablation;  Surgeon: Will Meredith Leeds, MD;  Location: [facility];  Service: Cardiovascular;  Laterality: N/A;  . EP IMPLANTABLE DEVICE N/A 01/30/2016   Procedure: Loop Recorder Insertion;  Surgeon: Sanda Klein, MD;  Location: [facility];  Service: Cardiovascular;  Laterality: N/A;  . INCISE AND DRAIN ABCESS     abscess on right thigh and buttock  . KNEE ARTHROSCOPY     left  .  SHOULDER SURGERY  04/14/09   right   Past Medical History:  Diagnosis Date  . Abscess    increased drainage from abscess on buttock  . Anal fistula   . Anxiety   . Bilateral hip pain 05/27/2015  . Chronic pain syndrome 05/27/2015  . Depression    sees [name]  . Diabetes mellitus without complication ([place])   . Hypertension   . Sleep apnea    2008- sleep study, neg. for sleep apnea   . SVT (supraventricular tachycardia) ([place])    LMP 09/15/2014   Opioid Risk Score:   Fall Risk Score:  `1  Depression screen PHQ 2/9  Depression screen [facility] 2/9 02/22/2016 11/28/2015 10/26/2015 05/12/2015 09/28/2014 08/23/2014 06/23/2014  Decreased Interest 0 0 3 2 1  0 0  Down, Depressed, Hopeless 0 0 3 1 0 0  0  PHQ - 2 Score 0 0 6 3 1  0 0  Altered sleeping - - 3 3 - - -  Tired, decreased energy - - 3 1 - - -  Change in appetite - - 3 1 - - -  Feeling bad or failure about yourself  - - 3 0 - - -  Trouble concentrating - - 3 0 - - -  Moving slowly or fidgety/restless - - 0 0 - - -  Suicidal thoughts - - 0 0 - - -  PHQ-9 Score - - 21 8 - - -  Difficult doing work/chores - - Very difficult Somewhat difficult - - -    Review of Systems  Constitutional: Positive for fever.  HENT: Negative.   Eyes: Negative.   Respiratory: Negative.   Cardiovascular: Negative.   Gastrointestinal: Positive for nausea and vomiting.  Endocrine: Negative.   Genitourinary: Negative.   Musculoskeletal: Negative.   Skin: Negative.   Allergic/Immunologic: Negative.   Neurological: Negative.   Hematological: Negative.   Psychiatric/Behavioral: Negative.   All other systems reviewed and are negative.      Objective:   Physical Exam  Constitutional: She is oriented to person, place, and time. She appears well-developed and well-nourished.  HENT:  Head: Normocephalic and atraumatic.  Neck: Normal range of motion. Neck supple.  Cardiovascular: Normal rate and regular rhythm.   Pulmonary/Chest: Effort normal and breath sounds normal.  Musculoskeletal:  Normal Muscle Bulk and Muscle Testing Reveals: Upper Extremities: Full ROM and Muscle Strength 5/5 Lumbar Paraspinal Tenderness: L-3- L-5 Left Greater Trochanter Tenderness Lower Extremities: Full ROM and Muscle Strength 5/5 Arises from Table Slowly Antalgic Gait   Neurological: She is alert and oriented to person, place, and time.  Skin: Skin is warm and dry.  Psychiatric: She has a normal mood and affect.  Nursing note and vitals reviewed.         Assessment & Plan:  1.Chronic BilateralHip pain L>R---endstage OA of hips. : Awaiting hip replacements.Continue HEP as tolerated and Heat and Ice Therapy Refilled:Hydrocodone 10/325 one q6 hours as  needed for moderate pain #120. 05/31/2016 We will continue the opioid monitoring program, this consists of regular clinic visits, examinations, urine drug screen, pill counts as well as use of [place] Controlled Substance Reporting System. 2. Lumbar Radiculopathy: Continue HEP and Continue to Monitor. 05/31/2016- 3. Morbid obesity: Continue with healthy diet Regime and HEP: She has lost 32 lbs. Encouraged to continue with healthy diet regime. 05/31/2016  20 minutes of face to face patient care time was spent during this visit. All questions were encouraged and answered. .  F/U in 1 month

## 2016-06-04 ENCOUNTER — Encounter: Payer: Self-pay | Admitting: Registered Nurse

## 2016-06-04 ENCOUNTER — Telehealth: Payer: Self-pay | Admitting: Internal Medicine

## 2016-06-04 ENCOUNTER — Other Ambulatory Visit: Payer: Self-pay

## 2016-06-04 DIAGNOSIS — R109 Unspecified abdominal pain: Secondary | ICD-10-CM

## 2016-06-04 NOTE — Telephone Encounter (Signed)
See result note.  

## 2016-06-04 NOTE — Telephone Encounter (Signed)
Pt returning Linda's call about results from CT scan on 05/30/16

## 2016-06-06 ENCOUNTER — Telehealth: Payer: Self-pay | Admitting: Physical Medicine & Rehabilitation

## 2016-06-06 NOTE — Telephone Encounter (Signed)
Please call patient, she has information for you that you wanted her to get.

## 2016-06-06 NOTE — Telephone Encounter (Signed)
Return [name], she has an appointment with Physical Therapy on 06/11/2016. She will be attending Physical Therapy Bi-weekly, due to financial hardship with co-pays, she will be allowed to pick up prescription of Hydrocodone in April 2018. She was instructed to [name] office on 07/02/2016, she verbalizes understanding.  She has a scheduled appointment with Dr. Naaman Plummer 08/01/2016 and Dr. Marlou Sa on 09/06/2016.

## 2016-06-11 ENCOUNTER — Ambulatory Visit: Payer: BC Managed Care – PPO

## 2016-06-11 LAB — CUP PACEART REMOTE DEVICE CHECK
Date Time Interrogation Session: 20180320193916
MDC IDC PG IMPLANT DT: 20171120

## 2016-06-13 ENCOUNTER — Ambulatory Visit (HOSPITAL_COMMUNITY): Payer: BC Managed Care – PPO

## 2016-06-15 ENCOUNTER — Encounter: Payer: Self-pay | Admitting: Cardiovascular Disease

## 2016-06-16 ENCOUNTER — Other Ambulatory Visit: Payer: Self-pay | Admitting: Medical

## 2016-06-18 ENCOUNTER — Ambulatory Visit: Payer: BC Managed Care – PPO | Attending: Registered Nurse

## 2016-06-18 ENCOUNTER — Telehealth: Payer: Self-pay | Admitting: Cardiovascular Disease

## 2016-06-18 DIAGNOSIS — M2560 Stiffness of unspecified joint, not elsewhere classified: Secondary | ICD-10-CM

## 2016-06-18 DIAGNOSIS — R293 Abnormal posture: Secondary | ICD-10-CM

## 2016-06-18 DIAGNOSIS — M51369 Other intervertebral disc degeneration, lumbar region without mention of lumbar back pain or lower extremity pain: Secondary | ICD-10-CM

## 2016-06-18 DIAGNOSIS — M5136 Other intervertebral disc degeneration, lumbar region: Secondary | ICD-10-CM | POA: Diagnosis present

## 2016-06-18 DIAGNOSIS — M62830 Muscle spasm of back: Secondary | ICD-10-CM | POA: Diagnosis present

## 2016-06-18 DIAGNOSIS — R2681 Unsteadiness on feet: Secondary | ICD-10-CM | POA: Diagnosis present

## 2016-06-18 NOTE — Telephone Encounter (Signed)
Patient calling for results in regards to her tracker. Please call,thanks.

## 2016-06-18 NOTE — Therapy (Signed)
[place], [place], [address] Phone: [phone]   Fax:  [phone]  Physical Therapy Evaluation  Patient Details  Name: [name] MRN: [id number] Date of Birth: 08-Feb-1961 Referring Provider: Danella Sensing , NP  Encounter Date: 06/18/2016      PT End of Session - 06/18/16 1706    Visit Number 1   Number of Visits 12   Date for PT Re-Evaluation 07/27/16   Authorization Type BCBS   PT Start Time 0312  Pt late   PT Stop Time 0345   PT Time Calculation (min) 33 min   Activity Tolerance Patient tolerated treatment well   Behavior During Therapy [facility] for tasks assessed/performed      Past Medical History:  Diagnosis Date  . Abscess    increased drainage from abscess on buttock  . Anal fistula   . Anxiety   . Bilateral hip pain 05/27/2015  . Chronic pain syndrome 05/27/2015  . Depression    sees [name]  . Diabetes mellitus without complication ([place])   . Hypertension   . Sleep apnea    2008- sleep study, neg. for sleep apnea   . SVT (supraventricular tachycardia) ([facility])     Past Surgical History:  Procedure Laterality Date  . ANAL EXAMINATION UNDER ANESTHESIA  02/21/11   anal fistula  . BREAST SURGERY  patient does not remember date of procedure   pull fluid off lft br  . ELECTROPHYSIOLOGIC STUDY N/A 05/05/2015   Procedure: SVT Ablation;  Surgeon: Will Meredith Leeds, MD;  Location: [facility];  Service: Cardiovascular;  Laterality: N/A;  . EP IMPLANTABLE DEVICE N/A 01/30/2016   Procedure: Loop Recorder Insertion;  Surgeon: Sanda Klein, MD;  Location: [facility];  Service: Cardiovascular;  Laterality: N/A;  . INCISE AND DRAIN ABCESS     abscess on right thigh and buttock  . KNEE ARTHROSCOPY     left  . SHOULDER SURGERY  04/14/09   right    There were no vitals filed for this visit.       Subjective Assessment - 06/18/16 1516    Subjective She reports DDD in  lower back.   She reports hip pain bilaterlly  and reports possible THA.    Limitations Sitting  liits vacumming / limited cooking due to standing pain   How long can you sit comfortably? 30 min   How long can you stand comfortably? 20 min   How long can you walk comfortably? As needed   Diagnostic tests CT scan: DDD   Currently in Pain? Yes   Pain Score 7    Pain Location Back   Pain Orientation Lower;Posterior;Right;Left   Pain Descriptors / Indicators Aching;Dull   Pain Type Chronic pain   Pain Onset More than a month ago   Pain Frequency Constant   Aggravating Factors  activity in hme on feet /sitting   Pain Relieving Factors Medications/ change positions   Multiple Pain Sites No            [facility] PT Assessment - 06/18/16 0001      Assessment   Medical Diagnosis DDD lumbar   Referring Provider Danella Sensing , NP   Onset Date/Surgical Date --  1 + years   Next MD Visit Next month   Prior Therapy No     Precautions   Precautions None     Restrictions   Weight Bearing Restrictions No     Balance Screen  Has the patient fallen in the past 6 months Yes   How many times? 5  LOB with walking   Has the patient had a decrease in activity level because of a fear of falling?  No   Is the patient reluctant to leave their home because of a fear of falling?  No     Prior Function   Level of Independence Independent     Cognition   Overall Cognitive Status Within Functional Limits for tasks assessed     Posture/Postural Control   Posture Comments WNL     ROM / Strength   AROM / PROM / Strength AROM;Strength     AROM   Overall AROM Comments Hip flexion limited by girth, Rotation 40 ER and 5-10 IR bilaterally    AROM Assessment Site Lumbar   Lumbar Flexion 40   Lumbar Extension 10   Lumbar - Right Side Bend 15   Lumbar - Left Side Bend 20   Lumbar - Right Rotation 30   Lumbar - Left Rotation 30     Strength   Overall Strength Comments Hips and quads  4/5   other proups 4+/5 or better      Flexibility   Soft Tissue Assessment /Muscle Length yes   Hamstrings 60 degrees bilaterally     Standardized Balance Assessment   Standardized Balance Assessment Berg Balance Test     Berg Balance Test   Sit to Stand Able to stand without using hands and stabilize independently   Standing Unsupported Able to stand safely 2 minutes   Sitting with Back Unsupported but Feet Supported on Floor or Stool Able to sit safely and securely 2 minutes   Stand to Sit Sits safely with minimal use of hands   Transfers Able to transfer safely, minor use of hands   Standing Unsupported with Eyes Closed Able to stand 10 seconds safely   Standing Ubsupported with Feet Together Able to place feet together independently and stand 1 minute safely   From Standing, Reach Forward with Outstretched Arm Can reach forward >5 cm safely (2")   From Standing Position, Pick up Object from Floor Able to pick up shoe safely and easily   From Standing Position, Turn to Look Behind Over each Shoulder Looks behind from both sides and weight shifts well   Turn 360 Degrees Able to turn 360 degrees safely in 4 seconds or less   Standing Unsupported, Alternately Place Feet on Step/Stool Able to stand independently and safely and complete 8 steps in 20 seconds   Standing Unsupported, One Foot in Front Able to plae foot ahead of the other independently and hold 30 seconds   Standing on One Leg Tries to lift leg/unable to hold 3 seconds but remains standing independently   Total Score 50   Berg comment: She was very unsteady with single leg or narrow base of suppport.      FOTO 63% limited                      PT Education - 06/18/16 1705    Education provided Yes   Education Details dry needling handout, POC   Person(s) Educated Patient   Methods Explanation   Comprehension Verbalized understanding          PT Short Term Goals - 06/18/16 1713      PT SHORT TERM GOAL  #1   Title She will be independnet with inintal HEP    Time 3  Period Weeks   Status New     PT SHORT TERM GOAL #2   Title She will report pain decreased 30% with activity at home  tasks   Time 3   Period Weeks   Status New     PT SHORT TERM GOAL #3   Title She will report trunk flexion to 55 degrees   Time 3   Period Weeks   Status New           PT Long Term Goals - 06/18/16 1714      PT LONG TERM GOAL #1   Title She will be independent with all HEP issued   Time 6   Period Weeks   Status New     PT LONG TERM GOAL #2   Title she will reportr 50% decr pain with general activity   Time 6   Status New     PT LONG TERM GOAL #3   Title She will report doing  vacumming with minimal incr pain   Time 6   Period Weeks   Status New     PT LONG TERM GOAL #4   Title She will improve BERG score to 54/56  to demo incr safety with walking   Time 6   Period Weeks   Status New               Plan - 06/18/16 1707    Clinical Impression Statement Ms Carrigg  presents with moderate complexity eval with chronic back painand reports of decr balalnce  stiffness of spine and hips   Rehab Potential Good   PT Frequency 2x / week   PT Duration 6 weeks   PT Treatment/Interventions Electrical Stimulation;Moist Heat;Passive range of motion;Patient/family education;Manual techniques;Therapeutic exercise;Dry needling   PT Next Visit Plan Stretching and core strength.  Modalities as needed.  manual    Consulted and Agree with Plan of Care Patient      Patient will benefit from skilled therapeutic intervention in order to improve the following deficits and impairments:  Decreased range of motion, Difficulty walking, Pain, Decreased activity tolerance, Decreased strength, Postural dysfunction, Increased muscle spasms, Increased fascial restricitons  Visit Diagnosis: Degenerative disc disease, lumbar - Plan: PT plan of care cert/re-cert  Abnormal posture - Plan: PT plan of  care cert/re-cert  Muscle spasm of back - Plan: PT plan of care cert/re-cert  Unsteadiness on feet - Plan: PT plan of care cert/re-cert  Joint stiffness of spine - Plan: PT plan of care cert/re-cert     Problem List Patient Active Problem List   Diagnosis Date Noted  . Hyperlipidemia 02/29/2016  . Urinary incontinence 02/29/2016  . Urge incontinence 02/29/2016  . Hypokalemia 02/29/2016  . Heart palpitations 01/30/2016  . Syncope 01/05/2016  . Acute bilateral low back pain without sciatica 01/04/2016  . Dark urine 01/04/2016  . Edema 01/04/2016  . Generalized anxiety disorder 10/26/2015  . Insomnia 10/26/2015  . History of multiple pulmonary nodules 10/26/2015  . Erythema intertrigo 09/21/2015  . Furuncle 09/21/2015  . Skin breakdown 09/21/2015  . Cutaneous abscess of chest wall 09/21/2015  . Bilateral hip joint arthritis 05/27/2015  . Abnormality of gait 05/27/2015  . Chronic pain syndrome 05/27/2015  . Yeast vaginitis 05/12/2015  . AVNRT (AV nodal re-entry tachycardia) ([place]) 05/05/2015  . Fibroids 10/18/2014  . Lower abdominal pain 10/18/2014  . Low TSH level 09/17/2014  . Thyroid nodule 09/17/2014  . Bilateral leg edema 09/01/2014  . DOE (dyspnea on exertion) 09/01/2014  .  Morbid obesity ([place]) 09/01/2014  . 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 DM type 2 (diabetes mellitus, type 2) ([place]) 05/20/2014  . Maceration of skin 10/27/2012  . Dehiscence of incision 10/27/2012  . Leukocytosis 10/27/2012  . Open wound 08/14/2011  . Anal fistula 01/19/2011  . Hidradenitis 10/17/2010    Darrel Hoover  PT 06/18/2016, 5:23 PM  [facility] [address] [place], [place], [address] Phone: [phone]   Fax:  [phone]  Name: [name] MRN: [id number] Date of Birth: 01/18/1961

## 2016-06-18 NOTE — Telephone Encounter (Signed)
Attempted to reach patient to advise that no events were detected by ILR through 06/18/16 at 4:50am.  No answer, straight to VM.  Per DPR, no permission given to LVM.    Replied to patient's MyChart message with recommendations regarding using symptom activator to mark symptom episodes.  Also gave [facility] phone number for questions/concerns.

## 2016-06-18 NOTE — Telephone Encounter (Signed)
Please advise if any notifications have come in from this patient's monitor.

## 2016-06-20 ENCOUNTER — Telehealth: Payer: Self-pay | Admitting: Cardiovascular Disease

## 2016-06-20 NOTE — Telephone Encounter (Signed)
New message    Patient c/o Palpitations:  High priority if patient c/o lightheadedness and shortness of breath.  1. How long have you been having palpitations? Started in the last week  2. Are you currently experiencing lightheadedness and shortness of breath? No lightheadedness but having a hard time catching her breath  3. Have you checked your BP and heart rate? (document readings) no  4. Are you experiencing any other symptoms? Heart beating fast and having a heart time catching her breath

## 2016-06-21 NOTE — Telephone Encounter (Signed)
Returned the phone call to the patient. There was no answer. Per most recent DPR, permitted to only speak to the patient and not leave a message.

## 2016-06-22 NOTE — Telephone Encounter (Signed)
Returned the call to the patient. It went straight to voice mail. Per DPR, only speak to the patient. Do not leave messages.

## 2016-06-25 NOTE — Telephone Encounter (Signed)
Unable to reach pt or leave a message  

## 2016-06-26 ENCOUNTER — Telehealth: Payer: Self-pay | Admitting: Physical Therapy

## 2016-06-26 ENCOUNTER — Encounter: Payer: BC Managed Care – PPO | Admitting: Physical Therapy

## 2016-06-26 ENCOUNTER — Ambulatory Visit: Payer: BC Managed Care – PPO | Admitting: Physical Therapy

## 2016-06-26 NOTE — Telephone Encounter (Signed)
Left message on machine about missed visit today.  Informed her of her next visit 07/03/2016. Asked her to call to let us know if she is going to cancel or if she has decided she does not need PT. Melvenia Needles PTA

## 2016-06-28 ENCOUNTER — Encounter: Payer: BC Managed Care – PPO | Admitting: Registered Nurse

## 2016-06-28 ENCOUNTER — Ambulatory Visit (INDEPENDENT_AMBULATORY_CARE_PROVIDER_SITE_OTHER): Payer: BC Managed Care – PPO | Admitting: *Deleted

## 2016-06-28 DIAGNOSIS — R55 Syncope and collapse: Secondary | ICD-10-CM | POA: Diagnosis not present

## 2016-06-29 NOTE — Progress Notes (Signed)
[facility] Summary Report / Loop Recorder 

## 2016-07-03 ENCOUNTER — Ambulatory Visit: Payer: BC Managed Care – PPO | Admitting: Physical Therapy

## 2016-07-03 ENCOUNTER — Telehealth: Payer: Self-pay | Admitting: *Deleted

## 2016-07-03 DIAGNOSIS — R2681 Unsteadiness on feet: Secondary | ICD-10-CM

## 2016-07-03 DIAGNOSIS — M2560 Stiffness of unspecified joint, not elsewhere classified: Secondary | ICD-10-CM

## 2016-07-03 DIAGNOSIS — M5136 Other intervertebral disc degeneration, lumbar region: Secondary | ICD-10-CM | POA: Diagnosis not present

## 2016-07-03 DIAGNOSIS — R293 Abnormal posture: Secondary | ICD-10-CM

## 2016-07-03 DIAGNOSIS — M62830 Muscle spasm of back: Secondary | ICD-10-CM

## 2016-07-03 NOTE — Telephone Encounter (Signed)
[name] asks that you call her.

## 2016-07-03 NOTE — Patient Instructions (Addendum)
Hamstring Stretch: Active    Support behind right knee. Starting with knee bent, attempt to straighten knee until a comfortable stretch is felt in back of thigh. Hold __30__ seconds. Repeat __3__ times per set. Do __1__ sets per session. Do __2__ sessions per day.  http://orth.exer.us/159   Copyright  VHI. All rights reserved.    Knee to Chest (Flexion)   Pull knee toward chest. Feel stretch in lower back or buttock area. Breathing deeply, Hold __30__ seconds. Repeat with other knee. Repeat _3___ times. Do __2__ sessions per day.    Lumbar Rotation: Caudal - Bilateral (Supine)   Feet and knees together, arms outstretched, slowly drop knee to one side. Look the other way.  Hold 30 seconds  Repeat _3___ times per set.  Do __3__ sessions per day.  Piriformis Stretch    Lying on back, pull right knee toward opposite shoulder. Hold ___30_ seconds. Repeat ___3  times. Do __2__ sessions per day.  http://gt2.exer.us/258   Copyright  VHI. All rights reserved.

## 2016-07-03 NOTE — Telephone Encounter (Signed)
Return Ms. Blomberg call, left message to return the call.

## 2016-07-04 NOTE — Therapy (Signed)
[place], [place], [address] Phone: [phone]   Fax:  [phone]  Physical Therapy Treatment  Patient Details  Name: [name] MRN: [id number] Date of Birth: 07/16/1960 Referring Provider: Danella Sensing , NP  Encounter Date: 07/03/2016      PT End of Session - 07/03/16 1554    Visit Number 2   Number of Visits 12   Date for PT Re-Evaluation 07/27/16   Authorization Type BCBS   PT Start Time 0345   PT Stop Time 0430   PT Time Calculation (min) 45 min      Past Medical History:  Diagnosis Date  . Abscess    increased drainage from abscess on buttock  . Anal fistula   . Anxiety   . Bilateral hip pain 05/27/2015  . Chronic pain syndrome 05/27/2015  . Depression    sees [name]  . Diabetes mellitus without complication ([place])   . Hypertension   . Sleep apnea    2008- sleep study, neg. for sleep apnea   . SVT (supraventricular tachycardia) (HCC)     Past Surgical History:  Procedure Laterality Date  . ANAL EXAMINATION UNDER ANESTHESIA  02/21/11   anal fistula  . BREAST SURGERY  patient does not remember date of procedure   pull fluid off lft br  . ELECTROPHYSIOLOGIC STUDY N/A 05/05/2015   Procedure: SVT Ablation;  Surgeon: Will Meredith Leeds, MD;  Location: [facility];  Service: Cardiovascular;  Laterality: N/A;  . EP IMPLANTABLE DEVICE N/A 01/30/2016   Procedure: Loop Recorder Insertion;  Surgeon: Sanda Klein, MD;  Location: [facility];  Service: Cardiovascular;  Laterality: N/A;  . INCISE AND DRAIN ABCESS     abscess on right thigh and buttock  . KNEE ARTHROSCOPY     left  . SHOULDER SURGERY  04/14/09   right    There were no vitals filed for this visit.      Subjective Assessment - 07/03/16 1547    Subjective I'm sore, well it hurts    Currently in Pain? Yes   Pain Score 7    Pain Location Back   Pain Orientation Left;Right;Lower   Pain  Descriptors / Indicators Sore;Aching   Aggravating Factors  raining, prolonged on foot, or prolonged sitting    Pain Relieving Factors meds/ changing positions                          [facility] Adult PT Treatment/Exercise - 07/04/16 0001      Lumbar Exercises: Stretches   Active Hamstring Stretch 3 reps;30 seconds   Single Knee to Chest Stretch 3 reps;30 seconds   Lower Trunk Rotation 3 reps;30 seconds   Piriformis Stretch 3 reps;30 seconds   Piriformis Stretch Limitations knee to opposite shoulder     Manual Therapy   Manual Therapy Joint mobilization   Joint Mobilization A/P grade 3 hip mobs bilateral as well as long axis distraction bilateral  followed by PROM                 PT Education - 07/03/16 1559    Education provided Yes   Education Details HEP   Person(s) Educated Patient   Methods Explanation;Handout   Comprehension Verbalized understanding          PT Short Term Goals - 06/18/16 1713      PT SHORT TERM GOAL #1   Title She will be independnet  with inintal HEP    Time 3   Period Weeks   Status New     PT SHORT TERM GOAL #2   Title She will report pain decreased 30% with activity at home  tasks   Time 3   Period Weeks   Status New     PT SHORT TERM GOAL #3   Title She will report trunk flexion to 55 degrees   Time 3   Period Weeks   Status New           PT Long Term Goals - 06/18/16 1714      PT LONG TERM GOAL #1   Title She will be independent with all HEP issued   Time 6   Period Weeks   Status New     PT LONG TERM GOAL #2   Title she will reportr 50% decr pain with general activity   Time 6   Status New     PT LONG TERM GOAL #3   Title She will report doing  vacumming with minimal incr pain   Time 6   Period Weeks   Status New     PT LONG TERM GOAL #4   Title She will improve BERG score to 54/56  to demo incr safety with walking   Time 6   Period Weeks   Status New               Plan -  07/03/16 1620    Clinical Impression Statement Pt has pain with Hip rotations Left worse than right. performed Hip mobs and established HEP for trunk and hip mobility. Pain reduced to 5/10 after treatment.    PT Next Visit Plan Stretching and core strength.  Modalities as needed.  manual ; review HEP   PT Home Exercise Plan knee to chest, LTR, hamstring stretch, piriformis stretch    Consulted and Agree with Plan of Care Patient      Patient will benefit from skilled therapeutic intervention in order to improve the following deficits and impairments:  Decreased range of motion, Difficulty walking, Pain, Decreased activity tolerance, Decreased strength, Postural dysfunction, Increased muscle spasms, Increased fascial restricitons  Visit Diagnosis: Degenerative disc disease, lumbar  Abnormal posture  Muscle spasm of back  Unsteadiness on feet  Joint stiffness of spine     Problem List Patient Active Problem List   Diagnosis Date Noted  . Hyperlipidemia 02/29/2016  . Urinary incontinence 02/29/2016  . Urge incontinence 02/29/2016  . Hypokalemia 02/29/2016  . Heart palpitations 01/30/2016  . Syncope 01/05/2016  . Acute bilateral low back pain without sciatica 01/04/2016  . Dark urine 01/04/2016  . Edema 01/04/2016  . Generalized anxiety disorder 10/26/2015  . Insomnia 10/26/2015  . History of multiple pulmonary nodules 10/26/2015  . Erythema intertrigo 09/21/2015  . Furuncle 09/21/2015  . Skin breakdown 09/21/2015  . Cutaneous abscess of chest wall 09/21/2015  . Bilateral hip joint arthritis 05/27/2015  . Abnormality of gait 05/27/2015  . Chronic pain syndrome 05/27/2015  . Yeast vaginitis 05/12/2015  . AVNRT (AV nodal re-entry tachycardia) ([place]) 05/05/2015  . Fibroids 10/18/2014  . Lower abdominal pain 10/18/2014  . Low TSH level 09/17/2014  . Thyroid nodule 09/17/2014  . Bilateral leg edema 09/01/2014  . DOE (dyspnea on exertion) 09/01/2014  . Morbid obesity ([place])  09/01/2014  . 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 DM type 2 (diabetes mellitus, type 2) ([place]) 05/20/2014  . Maceration of skin 10/27/2012  .  Dehiscence of incision 10/27/2012  . Leukocytosis 10/27/2012  . Open wound 08/14/2011  . Anal fistula 01/19/2011  . Hidradenitis 10/17/2010    Dorene Ar, PTA 07/04/2016, 9:12 AM  [place], [place], [address] Phone: [phone]   Fax:  [phone]  Name: [name] MRN: [id number] Date of Birth: [birth year]

## 2016-07-05 ENCOUNTER — Telehealth: Payer: Self-pay | Admitting: Registered Nurse

## 2016-07-05 MED ORDER — HYDROCODONE-ACETAMINOPHEN 10-325 MG PO TABS: 1.0000 | ORAL_TABLET | Freq: Four times a day (QID) | ORAL | 0 refills | Status: DC | PRN

## 2016-07-05 NOTE — Telephone Encounter (Signed)
Ms. Tesch called office today, she's attending Physical Therapy weekly, and next week her therapy will increase to Bi-weekly. Due to Financial hardship, I will allow her to pick up her prescription. NCCSR was reviewed last prescription of Hydrocodone was picked up on 06/06/2016. She has an appointment with Dr. Naaman Plummer in May, she realizes she needs to keep this appointment, she verbalizes understanding.

## 2016-07-06 LAB — CUP PACEART REMOTE DEVICE CHECK
Implantable Pulse Generator Implant Date: 20171120
MDC IDC SESS DTM: 20180419201030

## 2016-07-09 ENCOUNTER — Encounter: Payer: Self-pay | Admitting: Medical

## 2016-07-09 ENCOUNTER — Ambulatory Visit (INDEPENDENT_AMBULATORY_CARE_PROVIDER_SITE_OTHER): Payer: BC Managed Care – PPO | Admitting: Medical

## 2016-07-09 VITALS — BP 110/70 | HR 70 | Wt 236.0 lb

## 2016-07-09 DIAGNOSIS — E119 Type 2 diabetes mellitus without complications: Secondary | ICD-10-CM | POA: Diagnosis not present

## 2016-07-09 DIAGNOSIS — E785 Hyperlipidemia, unspecified: Secondary | ICD-10-CM | POA: Diagnosis not present

## 2016-07-09 DIAGNOSIS — E088 Diabetes mellitus due to underlying condition with unspecified complications: Secondary | ICD-10-CM | POA: Diagnosis not present

## 2016-07-09 DIAGNOSIS — R232 Flushing: Secondary | ICD-10-CM

## 2016-07-09 DIAGNOSIS — G894 Chronic pain syndrome: Secondary | ICD-10-CM

## 2016-07-09 DIAGNOSIS — E118 Type 2 diabetes mellitus with unspecified complications: Secondary | ICD-10-CM

## 2016-07-09 DIAGNOSIS — F411 Generalized anxiety disorder: Secondary | ICD-10-CM | POA: Diagnosis not present

## 2016-07-09 DIAGNOSIS — G4700 Insomnia, unspecified: Secondary | ICD-10-CM

## 2016-07-09 DIAGNOSIS — Z794 Long term (current) use of insulin: Secondary | ICD-10-CM

## 2016-07-09 DIAGNOSIS — E876 Hypokalemia: Secondary | ICD-10-CM

## 2016-07-09 LAB — COMPREHENSIVE METABOLIC PANEL
ALBUMIN: 3.8 g/dL (ref 3.6–5.1)
ALT: 12 U/L (ref 6–29)
AST: 10 U/L (ref 10–35)
Alkaline Phosphatase: 80 U/L (ref 33–130)
BILIRUBIN TOTAL: 0.3 mg/dL (ref 0.2–1.2)
BUN: 14 mg/dL (ref 7–25)
CO2: 28 mmol/L (ref 20–31)
Calcium: 9.7 mg/dL (ref 8.6–10.4)
Chloride: 98 mmol/L (ref 98–110)
Creat: 1.23 mg/dL — ABNORMAL HIGH (ref 0.50–1.05)
Glucose, Bld: 138 mg/dL — ABNORMAL HIGH (ref 65–99)
Potassium: 3.3 mmol/L — ABNORMAL LOW (ref 3.5–5.3)
Sodium: 141 mmol/L (ref 135–146)
TOTAL PROTEIN: 6.7 g/dL (ref 6.1–8.1)

## 2016-07-09 LAB — CBC
HCT: 35 % (ref 35.0–45.0)
Hemoglobin: 11.6 g/dL — ABNORMAL LOW (ref 11.7–15.5)
MCH: 25.7 pg — ABNORMAL LOW (ref 27.0–33.0)
MCHC: 33.1 g/dL (ref 32.0–36.0)
MCV: 77.4 fL — AB (ref 80.0–100.0)
MPV: 10.7 fL (ref 7.5–12.5)
PLATELETS: 439 10*3/uL — AB (ref 140–400)
RBC: 4.52 MIL/uL (ref 3.80–5.10)
RDW: 14.7 % (ref 11.0–15.0)
WBC: 10.8 10*3/uL — AB (ref 4.0–10.5)

## 2016-07-09 LAB — T4, FREE: FREE T4: 1.3 ng/dL (ref 0.8–1.8)

## 2016-07-09 LAB — TSH: TSH: 0.06 m[IU]/L — AB

## 2016-07-09 NOTE — Progress Notes (Addendum)
Subjective: Chief Complaint  Patient presents with  . dm check    dm check.feeling, dizzness, sweating alot, having trouble swolling , she wants her tyroid check.    Medical team: Dr. Zenovia Jarred and Alonza Bogus with GI Dr. Sanda Klein, cardiology [facility], PA-C here for primary care  Diabetes  Taking Metformin 850mg  BID, taking Xultophy injection 35 daily. Checking sugars 1-2 times daily.  morning fasting 120-140, at night 170-190 typically 2-2.5 hours after meal Exercise - walking daily Diet - mostly healthy, appetite has been down No recent foot concerns Sometimes polyuria, no polydipsia.   She notes 2 recent times that she woke up with hives on legs, arms, face, improved with benadryl.  No prior similar.     She notes sweating spells, hot flashes.   Having some mood swings.   LMP 2 years ago.  No prior HRT.   No prior gynecology visit for menopausal symptoms.   Mood has been ok, sometimes off, sometimes little things can set her off.    Having trouble swallowing, feels like pills get hung.  Did have EGD relatively recent, sees GI.  No foods get stuck where she vomits.    She is taking Citalopram and cymbalta, seem to get benefit from both for mood and chronic pain.   Past Medical History:  Diagnosis Date  . Abscess    increased drainage from abscess on buttock  . Anal fistula   . Anxiety   . Bilateral hip pain 05/27/2015  . Chronic pain syndrome 05/27/2015  . Depression    sees [name]  . Diabetes mellitus without complication ([place])   . Hypertension   . Sleep apnea    2008- sleep study, neg. for sleep apnea   . SVT (supraventricular tachycardia) ([place])    Current Outpatient Prescriptions on File Prior to Visit  Medication Sig Dispense Refill  . citalopram (CELEXA) 20 MG tablet TAKE ONE TABLET BY MOUTH ONCE DAILY 30 tablet 2  . DULoxetine (CYMBALTA) 60 MG capsule TAKE ONE CAPSULE BY MOUTH TWICE DAILY 60 capsule 2  . furosemide (LASIX) 40 MG tablet  Take 1.5 tablets (60 mg total) by mouth daily. 270 tablet 3  . HYDROcodone-acetaminophen (NORCO) 10-325 MG tablet Take 1 tablet by mouth every 6 (six) hours as needed. 120 tablet 0  . Insulin Degludec-Liraglutide (XULTOPHY) 100-3.6 UNIT-MG/ML SOPN Inject 1 pen into the skin every morning. 1 pen 0  . lubiprostone (AMITIZA) 24 MCG capsule TAKE 24 MCG BY MOUTH TWICE DAILY WITH MEALS 180 capsule 1  . metFORMIN (GLUCOPHAGE) 850 MG tablet Take 1 tablet (850 mg total) by mouth 2 (two) times daily with a meal. 180 tablet 2  . ondansetron (ZOFRAN) 4 MG tablet Take 1 tablet (4 mg total) by mouth every 8 (eight) hours as needed for nausea or vomiting. 20 tablet 0  . pantoprazole (PROTONIX) 40 MG tablet Take 1 tablet (40 mg total) by mouth 2 (two) times daily before a meal. Take 1 tablet 30-60 minutes before breakfast and dinner 60 tablet 3   No current facility-administered medications on file prior to visit.    ROS as in subjective  Objective BP 110/70   Pulse 70   Wt 236 lb (107 kg)   LMP 09/15/2014   SpO2 99%   BMI 41.81 kg/m   Wt Readings from Last 3 Encounters:  07/09/16 236 lb (107 kg)  05/17/16 238 lb (108 kg)  05/16/16 238 lb 3.2 oz (108 kg)    General  appearance: alert, no distress, WD/WN,  HEENT: normocephalic, sclerae anicteric, TMs pearly, nares patent, no discharge or erythema, pharynx normal Oral cavity: MMM, no lesions Neck: supple, no lymphadenopathy, no thyromegaly, no masses Heart: RRR, normal S1, S2, no murmurs Lungs: CTA bilaterally, no wheezes, rhonchi, or rales Abdomen: +bs, soft, non tender, non distended, no masses, no hepatomegaly, no splenomegaly Pulses: 2+ symmetric, upper and lower extremities, normal cap refill Ext: no edema    Assessment: Encounter Diagnoses  Name Primary?  . Diabetes mellitus due to underlying condition with complication, with long-term current use of insulin ([place]) Yes  . Hyperlipidemia, unspecified hyperlipidemia type   . Insomnia,  unspecified type   . Morbid obesity ([place])   . Hypokalemia   . Chronic pain syndrome   . Generalized anxiety disorder   . Hot flashes      Plan: Diabetes - labs today, increase to 40 u Xultophy, c/t metformin the same, c/t daily foot checks, yearly eye doctor appt.  C/t glucose monitoring.  Hyperlipidemia - non fasting today.   Add statin given atherosclerosis on 3/18 abdominal scan,  but recommended she come in soon for fasting lipids  Hives - f/u pending labs, discussed possible causes.  Swallow difficulties - reviewed recent EGD records.  She is taking PPI.  Consider swallow study  Morbid obesity - c/t efforts to lose weight  Hypokalemia - labs today, may need to add potassium given the lasix  chronic pain - c/t Cymbalta for pain  Insomnia, anxiety - c/t citalopram for now.  discussed sleep hygiene  Hot flashes - likely menopausal related.  F/u pending labs.  [name] was seen today for dm check.  Diagnoses and all orders for this visit:  Diabetes mellitus due to underlying condition with complication, with long-term current use of insulin ([place]) -     Comprehensive metabolic panel -     CBC -     Lipid panel; Future -     Hemoglobin A1c -     HM DIABETES EYE EXAM -     HM DIABETES FOOT EXAM -     Microalbumin / creatinine urine ratio -     TSH -     T4, free  Hyperlipidemia, unspecified hyperlipidemia type -     Lipid panel; Future  Insomnia, unspecified type  Morbid obesity ([place]) -     HM DIABETES EYE EXAM -     TSH -     T4, free  Hypokalemia -     Comprehensive metabolic panel  Chronic pain syndrome  Generalized anxiety disorder  Hot flashes

## 2016-07-10 ENCOUNTER — Ambulatory Visit: Payer: BC Managed Care – PPO | Attending: Registered Nurse

## 2016-07-10 DIAGNOSIS — M62830 Muscle spasm of back: Secondary | ICD-10-CM

## 2016-07-10 DIAGNOSIS — R2681 Unsteadiness on feet: Secondary | ICD-10-CM

## 2016-07-10 DIAGNOSIS — R293 Abnormal posture: Secondary | ICD-10-CM

## 2016-07-10 DIAGNOSIS — M2560 Stiffness of unspecified joint, not elsewhere classified: Secondary | ICD-10-CM | POA: Diagnosis present

## 2016-07-10 DIAGNOSIS — M5136 Other intervertebral disc degeneration, lumbar region: Secondary | ICD-10-CM | POA: Insufficient documentation

## 2016-07-10 LAB — MICROALBUMIN / CREATININE URINE RATIO
Creatinine, Urine: 64 mg/dL (ref 20–320)
MICROALB UR: 0.5 mg/dL
Microalb Creat Ratio: 8 mcg/mg creat (ref <30)

## 2016-07-10 LAB — HEMOGLOBIN A1C
HEMOGLOBIN A1C: 8.6 % — AB (ref <5.7)
MEAN PLASMA GLUCOSE: 200 mg/dL

## 2016-07-10 NOTE — Therapy (Signed)
[place], [place], [address] Phone: [phone]   Fax:  [phone]  Physical Therapy Treatment  Patient Details  Name: [name] MRN: [id number] Date of Birth: 18-May-1960 Referring Provider: Danella Sensing , NP  Encounter Date: 07/10/2016      PT End of Session - 07/10/16 1549    Visit Number 3   Number of Visits 12   Date for PT Re-Evaluation 07/27/16   Authorization Type BCBS   PT Start Time 0345   PT Stop Time 0438   PT Time Calculation (min) 53 min   Activity Tolerance Patient tolerated treatment well   Behavior During Therapy [facility] for tasks assessed/performed      Past Medical History:  Diagnosis Date  . Abscess    increased drainage from abscess on buttock  . Anal fistula   . Anxiety   . Bilateral hip pain 05/27/2015  . Chronic pain syndrome 05/27/2015  . Depression    sees [name]  . Diabetes mellitus without complication ([place])   . Hypertension   . Sleep apnea    2008- sleep study, neg. for sleep apnea   . SVT (supraventricular tachycardia) (HCC)     Past Surgical History:  Procedure Laterality Date  . ANAL EXAMINATION UNDER ANESTHESIA  02/21/11   anal fistula  . BREAST SURGERY  patient does not remember date of procedure   pull fluid off lft br  . ELECTROPHYSIOLOGIC STUDY N/A 05/05/2015   Procedure: SVT Ablation;  Surgeon: Will Meredith Leeds, MD;  Location: [place] [facility];  Service: Cardiovascular;  Laterality: N/A;  . EP IMPLANTABLE DEVICE N/A 01/30/2016   Procedure: Loop Recorder Insertion;  Surgeon: Sanda Klein, MD;  Location: [place] [facility];  Service: Cardiovascular;  Laterality: N/A;  . INCISE AND DRAIN ABCESS     abscess on right thigh and buttock  . KNEE ARTHROSCOPY     left  . SHOULDER SURGERY  04/14/09   right    There were no vitals filed for this visit.      Subjective Assessment - 07/10/16 1549    Subjective Saw MD yesterday . No changes.   Will see MD for hip and back in June.    Patient Stated Goals Decrease pain   Currently in Pain? Yes   Pain Score 6    Pain Location Back   Pain Orientation Right;Left;Lower   Pain Descriptors / Indicators Sore;Aching   Pain Type Chronic pain   Pain Onset More than a month ago   Pain Frequency Constant   Aggravating Factors  raIN , Incr time on feet   Pain Relieving Factors meds   Multiple Pain Sites No                         [facility] Adult PT Treatment/Exercise - 07/10/16 0001      Lumbar Exercises: Stretches   Pelvic Tilt 10 seconds   Pelvic Tilt Limitations 10 reps cued manual and verbal to do correctly and issued for HEP   Piriformis Stretch 2 reps;20 seconds   Piriformis Stretch Limitations figure 4  issued for home     Modalities   Modalities Moist Heat     Moist Heat Therapy   Number Minutes Moist Heat 15 Minutes   Moist Heat Location Lumbar Spine;Hip     Manual Therapy   Manual Therapy Joint mobilization   Joint Mobilization A/P grade 4 hip mobs bilateral as well  as long axis distraction bilateral  followed by PROM  hip flexion and adduction                  PT Short Term Goals - 06/18/16 1713      PT SHORT TERM GOAL #1   Title She will be independnet with inintal HEP    Time 3   Period Weeks   Status New     PT SHORT TERM GOAL #2   Title She will report pain decreased 30% with activity at home  tasks   Time 3   Period Weeks   Status New     PT SHORT TERM GOAL #3   Title She will report trunk flexion to 55 degrees   Time 3   Period Weeks   Status New           PT Long Term Goals - 06/18/16 1714      PT LONG TERM GOAL #1   Title She will be independent with all HEP issued   Time 6   Period Weeks   Status New     PT LONG TERM GOAL #2   Title she will reportr 50% decr pain with general activity   Time 6   Status New     PT LONG TERM GOAL #3   Title She will report doing  vacumming with minimal incr pain   Time 6    Period Weeks   Status New     PT LONG TERM GOAL #4   Title She will improve BERG score to 54/56  to demo incr safety with walking   Time 6   Period Weeks   Status New               Plan - 07/10/16 1625    Clinical Impression Statement Worked on stiffnes today and added stretch and abdomina posterior tilt.  set for home . She was alittle sore from manual due to tightness. better with MHP at end   PT Treatment/Interventions Electrical Stimulation;Moist Heat;Passive range of motion;Patient/family education;Manual techniques;Therapeutic exercise;Dry needling   PT Next Visit Plan Stretching and core strength.  Modalities as needed.  manual ; review HEP   PT Home Exercise Plan knee to chest, LTR, hamstring stretch, piriformis stretch , fig 4 stretch, posterior pelvic tilt   Consulted and Agree with Plan of Care Patient      Patient will benefit from skilled therapeutic intervention in order to improve the following deficits and impairments:  Decreased range of motion, Difficulty walking, Pain, Decreased activity tolerance, Decreased strength, Postural dysfunction, Increased muscle spasms, Increased fascial restricitons  Visit Diagnosis: Degenerative disc disease, lumbar  Abnormal posture  Muscle spasm of back  Unsteadiness on feet  Joint stiffness of spine     Problem List Patient Active Problem List   Diagnosis Date Noted  . Hot flashes 07/09/2016  . Diabetes mellitus due to underlying condition with complication, with long-term current use of insulin ([place]) 07/09/2016  . Hyperlipidemia 02/29/2016  . Urinary incontinence 02/29/2016  . Urge incontinence 02/29/2016  . Hypokalemia 02/29/2016  . Heart palpitations 01/30/2016  . Syncope 01/05/2016  . Acute bilateral low back pain without sciatica 01/04/2016  . Dark urine 01/04/2016  . Edema 01/04/2016  . Generalized anxiety disorder 10/26/2015  . Insomnia 10/26/2015  . History of multiple pulmonary nodules  10/26/2015  . Erythema intertrigo 09/21/2015  . Furuncle 09/21/2015  . Skin breakdown 09/21/2015  . Cutaneous abscess of chest wall 09/21/2015  .  Bilateral hip joint arthritis 05/27/2015  . Abnormality of gait 05/27/2015  . Chronic pain syndrome 05/27/2015  . Yeast vaginitis 05/12/2015  . AVNRT (AV nodal re-entry tachycardia) ([place]) 05/05/2015  . Fibroids 10/18/2014  . Lower abdominal pain 10/18/2014  . Low TSH level 09/17/2014  . Thyroid nodule 09/17/2014  . Bilateral leg edema 09/01/2014  . DOE (dyspnea on exertion) 09/01/2014  . Morbid obesity ([place]) 09/01/2014  . 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 DM type 2 (diabetes mellitus, type 2) ([place]) 05/20/2014  . Maceration of skin 10/27/2012  . Dehiscence of incision 10/27/2012  . Leukocytosis 10/27/2012  . Open wound 08/14/2011  . Anal fistula 01/19/2011  . Hidradenitis 10/17/2010    Darrel Hoover  PT 07/10/2016, 4:27 PM  [facility] [address] [place], [place], [address] Phone: [phone]   Fax:  [phone]  Name: [name] MRN: [id number] Date of Birth: 04/04/1960

## 2016-07-12 MED ORDER — POTASSIUM CHLORIDE ER 10 MEQ PO TBCR
10.0000 meq | EXTENDED_RELEASE_TABLET | Freq: Two times a day (BID) | ORAL | 1 refills | Status: DC
Start: 2016-07-12 — End: 2016-09-25

## 2016-07-12 MED ORDER — INSULIN DEGLUDEC-LIRAGLUTIDE 100-3.6 UNIT-MG/ML ~~LOC~~ SOPN: 40.0000 [IU] | PEN_INJECTOR | Freq: Every morning | SUBCUTANEOUS | 3 refills | Status: DC

## 2016-07-12 MED ORDER — METFORMIN HCL 850 MG PO TABS: 850.0000 mg | ORAL_TABLET | Freq: Two times a day (BID) | ORAL | 3 refills | Status: DC

## 2016-07-12 MED ORDER — PRAVASTATIN SODIUM 20 MG PO TABS: 20.0000 mg | ORAL_TABLET | Freq: Every day | ORAL | 1 refills | Status: DC

## 2016-07-12 NOTE — Addendum Note (Signed)
Addended by: Carlena Hurl on: 07/12/2016 10:45 AM   Modules accepted: Orders

## 2016-07-16 ENCOUNTER — Other Ambulatory Visit: Payer: Self-pay

## 2016-07-16 DIAGNOSIS — R1030 Lower abdominal pain, unspecified: Secondary | ICD-10-CM

## 2016-07-17 ENCOUNTER — Ambulatory Visit: Payer: BC Managed Care – PPO | Admitting: Physical Therapy

## 2016-07-17 ENCOUNTER — Encounter: Payer: Self-pay | Admitting: Physical Therapy

## 2016-07-17 DIAGNOSIS — M2560 Stiffness of unspecified joint, not elsewhere classified: Secondary | ICD-10-CM

## 2016-07-17 DIAGNOSIS — R293 Abnormal posture: Secondary | ICD-10-CM

## 2016-07-17 DIAGNOSIS — M5136 Other intervertebral disc degeneration, lumbar region: Secondary | ICD-10-CM | POA: Diagnosis not present

## 2016-07-17 DIAGNOSIS — R2681 Unsteadiness on feet: Secondary | ICD-10-CM

## 2016-07-17 DIAGNOSIS — M62830 Muscle spasm of back: Secondary | ICD-10-CM

## 2016-07-17 NOTE — Therapy (Signed)
[place], [place], [address] Phone: [phone]   Fax:  [phone]  Physical Therapy Treatment  Patient Details  Name: [name] MRN: [id number] Date of Birth: Dec 31, 1960 Referring Provider: Danella Sensing , NP  Encounter Date: 07/17/2016      PT End of Session - 07/17/16 1620    Visit Number 4   Number of Visits 12   Date for PT Re-Evaluation 07/27/16   PT Start Time 0923   PT Stop Time 1638   PT Time Calculation (min) 52 min   Activity Tolerance Patient tolerated treatment well   Behavior During Therapy [facility] for tasks assessed/performed      Past Medical History:  Diagnosis Date  . Abscess    increased drainage from abscess on buttock  . Anal fistula   . Anxiety   . Bilateral hip pain 05/27/2015  . Chronic pain syndrome 05/27/2015  . Depression    sees [name]  . Diabetes mellitus without complication ([place])   . Hypertension   . Sleep apnea    2008- sleep study, neg. for sleep apnea   . SVT (supraventricular tachycardia) (HCC)     Past Surgical History:  Procedure Laterality Date  . ANAL EXAMINATION UNDER ANESTHESIA  02/21/11   anal fistula  . BREAST SURGERY  patient does not remember date of procedure   pull fluid off lft br  . ELECTROPHYSIOLOGIC STUDY N/A 05/05/2015   Procedure: SVT Ablation;  Surgeon: Will Meredith Leeds, MD;  Location: [place] [facility];  Service: Cardiovascular;  Laterality: N/A;  . EP IMPLANTABLE DEVICE N/A 01/30/2016   Procedure: Loop Recorder Insertion;  Surgeon: Sanda Klein, MD;  Location: [place] [facility];  Service: Cardiovascular;  Laterality: N/A;  . INCISE AND DRAIN ABCESS     abscess on right thigh and buttock  . KNEE ARTHROSCOPY     left  . SHOULDER SURGERY  04/14/09   right    There were no vitals filed for this visit.      Subjective Assessment - 07/17/16 1550    Subjective "I am little better since doing the exercise, still some  soreness"    Currently in Pain? Yes   Pain Score 6    Pain Location Back   Pain Orientation Left   Pain Descriptors / Indicators Tightness   Pain Type Chronic pain   Pain Onset More than a month ago   Pain Frequency Constant   Aggravating Factors  standing/ walking  for long periods of time, normal activities    Pain Relieving Factors meds                         [facility] Adult PT Treatment/Exercise - 07/17/16 1608      Lumbar Exercises: Stretches   Active Hamstring Stretch 3 reps;30 seconds  contract/ relax with 10 sec hold     Lumbar Exercises: Supine   Straight Leg Raise 15 reps  x 2 sets     Moist Heat Therapy   Number Minutes Moist Heat 10 Minutes   Moist Heat Location Lumbar Spine;Hip     Manual Therapy   Manual Therapy Muscle Energy Technique;Soft tissue mobilization   Joint Mobilization grade 5 LLE long axis distraction   Soft tissue mobilization IASTM over the L lumbar paraspinals   Muscle Energy Technique L hip flexion with R hip extension 5 x 10 sec hold  scissor technique  Trigger Point Dry Needling - 07/17/16 1554    Consent Given? Yes   Education Handout Provided Yes   Muscles Treated Upper Body Longissimus   Longissimus Response Palpable increased muscle length;Twitch response elicited  L lumbar multifidus at L5-L1              PT Education - 07/17/16 1618    Education provided Yes   Education Details muscle anatomy and referral patterns. What TPDN is, benefits and what to expect. Anatomy of the innominates and effects of muscles on the innominate causing a rotation and pain.    Person(s) Educated Patient   Methods Explanation;Verbal cues;Handout   Comprehension Verbalized understanding;Verbal cues required          PT Short Term Goals - 06/18/16 1713      PT SHORT TERM GOAL #1   Title She will be independnet with inintal HEP    Time 3   Period Weeks   Status New     PT SHORT TERM GOAL #2   Title She will  report pain decreased 30% with activity at home  tasks   Time 3   Period Weeks   Status New     PT SHORT TERM GOAL #3   Title She will report trunk flexion to 55 degrees   Time 3   Period Weeks   Status New           PT Long Term Goals - 06/18/16 1714      PT LONG TERM GOAL #1   Title She will be independent with all HEP issued   Time 6   Period Weeks   Status New     PT LONG TERM GOAL #2   Title she will reportr 50% decr pain with general activity   Time 6   Status New     PT LONG TERM GOAL #3   Title She will report doing  vacumming with minimal incr pain   Time 6   Period Weeks   Status New     PT LONG TERM GOAL #4   Title She will improve BERG score to 54/56  to demo incr safety with walking   Time 6   Period Weeks   Status New               Plan - 07/17/16 1620    Clinical Impression Statement pt reports doing the exercises with some improvement. She demonstrates a possible L innominate posterior rotation; performed MET technique which she reported decreased pain with standing and trunk flexion. TPDN was explained and performed on the L lumbar paraspinals followed with IASTM over the L lumbar paraspinals which she reported relief of tightness. utilized MHP post session for soreness.   PT Next Visit Plan assess response to TPDN, Stretching and core strength.  Modalities as needed.  manual ; review HEP   PT Home Exercise Plan knee to chest, LTR, hamstring stretch, piriformis stretch , fig 4 stretch, posterior pelvic tilt   Consulted and Agree with Plan of Care Patient      Patient will benefit from skilled therapeutic intervention in order to improve the following deficits and impairments:  Decreased range of motion, Difficulty walking, Pain, Decreased activity tolerance, Decreased strength, Postural dysfunction, Increased muscle spasms, Increased fascial restricitons  Visit Diagnosis: Degenerative disc disease, lumbar  Abnormal posture  Muscle  spasm of back  Unsteadiness on feet  Joint stiffness of spine     Problem List Patient Active Problem List  Diagnosis Date Noted  . Hot flashes 07/09/2016  . Diabetes mellitus due to underlying condition with complication, with long-term current use of insulin ([place]) 07/09/2016  . Hyperlipidemia 02/29/2016  . Urinary incontinence 02/29/2016  . Urge incontinence 02/29/2016  . Hypokalemia 02/29/2016  . Heart palpitations 01/30/2016  . Syncope 01/05/2016  . Acute bilateral low back pain without sciatica 01/04/2016  . Dark urine 01/04/2016  . Edema 01/04/2016  . Generalized anxiety disorder 10/26/2015  . Insomnia 10/26/2015  . History of multiple pulmonary nodules 10/26/2015  . Erythema intertrigo 09/21/2015  . Furuncle 09/21/2015  . Skin breakdown 09/21/2015  . Cutaneous abscess of chest wall 09/21/2015  . Bilateral hip joint arthritis 05/27/2015  . Abnormality of gait 05/27/2015  . Chronic pain syndrome 05/27/2015  . Yeast vaginitis 05/12/2015  . AVNRT (AV nodal re-entry tachycardia) ([place]) 05/05/2015  . Fibroids 10/18/2014  . Lower abdominal pain 10/18/2014  . Low TSH level 09/17/2014  . Thyroid nodule 09/17/2014  . Bilateral leg edema 09/01/2014  . DOE (dyspnea on exertion) 09/01/2014  . Morbid obesity ([place]) 09/01/2014  . 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 DM type 2 (diabetes mellitus, type 2) ([place]) 05/20/2014  . Maceration of skin 10/27/2012  . Dehiscence of incision 10/27/2012  . Leukocytosis 10/27/2012  . Open wound 08/14/2011  . Anal fistula 01/19/2011  . Hidradenitis 10/17/2010   Starr Lake PT, DPT, LAT, ATC  07/17/16  4:32 PM      [facility] [address] [place], [place], [address] Phone: [phone]   Fax:  [phone]  Name: Raseel Jans MRN: [id number] Date of Birth: 1960/11/03

## 2016-07-23 ENCOUNTER — Encounter: Payer: Self-pay | Admitting: Internal Medicine

## 2016-07-24 ENCOUNTER — Ambulatory Visit: Payer: BC Managed Care – PPO | Admitting: Physical Therapy

## 2016-07-24 ENCOUNTER — Encounter: Payer: Self-pay | Admitting: Physical Therapy

## 2016-07-24 DIAGNOSIS — R293 Abnormal posture: Secondary | ICD-10-CM

## 2016-07-24 DIAGNOSIS — M5136 Other intervertebral disc degeneration, lumbar region: Secondary | ICD-10-CM

## 2016-07-24 DIAGNOSIS — R2681 Unsteadiness on feet: Secondary | ICD-10-CM

## 2016-07-24 DIAGNOSIS — M62830 Muscle spasm of back: Secondary | ICD-10-CM

## 2016-07-24 DIAGNOSIS — M2560 Stiffness of unspecified joint, not elsewhere classified: Secondary | ICD-10-CM

## 2016-07-24 NOTE — Therapy (Signed)
[name], [place], [address] Phone: [phone]   Fax:  [phone]  Physical Therapy Treatment  Patient Details  Name: [name] MRN: [id number] Date of Birth: Oct 10, 1960 Referring Provider: Danella Sensing , NP  Encounter Date: 07/24/2016      PT End of Session - 07/24/16 1700    Visit Number 5   Number of Visits 12   Date for PT Re-Evaluation 07/27/16   PT Start Time 6962   PT Stop Time 1631   PT Time Calculation (min) 46 min   Activity Tolerance Patient tolerated treatment well   Behavior During Therapy [facility] for tasks assessed/performed      Past Medical History:  Diagnosis Date  . Abscess    increased drainage from abscess on buttock  . Anal fistula   . Anxiety   . Bilateral hip pain 05/27/2015  . Chronic pain syndrome 05/27/2015  . Depression    sees [name]  . Diabetes mellitus without complication ([place])   . Hypertension   . Sleep apnea    2008- sleep study, neg. for sleep apnea   . SVT (supraventricular tachycardia) (HCC)     Past Surgical History:  Procedure Laterality Date  . ANAL EXAMINATION UNDER ANESTHESIA  02/21/11   anal fistula  . BREAST SURGERY  patient does not remember date of procedure   pull fluid off lft br  . ELECTROPHYSIOLOGIC STUDY N/A 05/05/2015   Procedure: SVT Ablation;  Surgeon: Will Meredith Leeds, MD;  Location: [facility];  Service: Cardiovascular;  Laterality: N/A;  . EP IMPLANTABLE DEVICE N/A 01/30/2016   Procedure: Loop Recorder Insertion;  Surgeon: Sanda Klein, MD;  Location: [facility];  Service: Cardiovascular;  Laterality: N/A;  . INCISE AND DRAIN ABCESS     abscess on right thigh and buttock  . KNEE ARTHROSCOPY     left  . SHOULDER SURGERY  04/14/09   right    There were no vitals filed for this visit.      Subjective Assessment - 07/24/16 1556    Subjective " I feel like I am doing alittle better since the last  session"    Currently in Pain? Yes   Pain Score 4    Pain Location Back   Pain Orientation Left   Pain Onset More than a month ago   Pain Frequency Constant   Aggravating Factors  standing/ walking for long periods of time.    Pain Relieving Factors meds, exercise                         [facility] Adult PT Treatment/Exercise - 07/24/16 1601      Self-Care   Self-Care Other Self-Care Comments   Other Self-Care Comments  proper mechanics of bed mobility utilizing log roll technique practicing 5 x      Lumbar Exercises: Stretches   Active Hamstring Stretch 3 reps;30 seconds   Lower Trunk Rotation 60 seconds  x 2     Lumbar Exercises: Seated   Other Seated Lumbar Exercises seated marching on dynadisc 2 x 15   cues to keep core tight     Lumbar Exercises: Supine   Bent Knee Raise 15 reps  cues to keep core tight   Other Supine Lumbar Exercises --                PT Education - 07/24/16 1659    Education provided Yes  Education Details benefits of core stability to protect the back, proper biomechanics of getting into and out of bed.    Person(s) Educated Patient   Methods Explanation;Verbal cues   Comprehension Verbalized understanding;Verbal cues required          PT Short Term Goals - 06/18/16 1713      PT SHORT TERM GOAL #1   Title She will be independnet with inintal HEP    Time 3   Period Weeks   Status New     PT SHORT TERM GOAL #2   Title She will report pain decreased 30% with activity at home  tasks   Time 3   Period Weeks   Status New     PT SHORT TERM GOAL #3   Title She will report trunk flexion to 55 degrees   Time 3   Period Weeks   Status New           PT Long Term Goals - 06/18/16 1714      PT LONG TERM GOAL #1   Title She will be independent with all HEP issued   Time 6   Period Weeks   Status New     PT LONG TERM GOAL #2   Title she will reportr 50% decr pain with general activity   Time 6   Status New      PT LONG TERM GOAL #3   Title She will report doing  vacumming with minimal incr pain   Time 6   Period Weeks   Status New     PT LONG TERM GOAL #4   Title She will improve BERG score to 54/56  to demo incr safety with walking   Time 6   Period Weeks   Status New               Plan - 07/24/16 1702    Clinical Impression Statement pt states she is feeling better and has been consistent with her HEP. focused on core strengthening and hip flexor strengthening which she fatigues quickly but performs well. reviewed mechanics of getting into/ out of bed which she required multiple cues for form. post session she reported pain dropped to a 1/10 and declined modalities.    PT Next Visit Plan ERO, goals, ROM,  Stretching and core strength.  Modalities as needed.  manual ; review HEP   PT Home Exercise Plan knee to chest, LTR, hamstring stretch, piriformis stretch , fig 4 stretch, posterior pelvic tilt   Consulted and Agree with Plan of Care Patient      Patient will benefit from skilled therapeutic intervention in order to improve the following deficits and impairments:  Decreased range of motion, Difficulty walking, Pain, Decreased activity tolerance, Decreased strength, Postural dysfunction, Increased muscle spasms, Increased fascial restricitons  Visit Diagnosis: Degenerative disc disease, lumbar  Abnormal posture  Muscle spasm of back  Unsteadiness on feet  Joint stiffness of spine     Problem List Patient Active Problem List   Diagnosis Date Noted  . Hot flashes 07/09/2016  . Diabetes mellitus due to underlying condition with complication, with long-term current use of insulin ([place]) 07/09/2016  . Hyperlipidemia 02/29/2016  . Urinary incontinence 02/29/2016  . Urge incontinence 02/29/2016  . Hypokalemia 02/29/2016  . Heart palpitations 01/30/2016  . Syncope 01/05/2016  . Acute bilateral low back pain without sciatica 01/04/2016  . Dark urine 01/04/2016  .  Edema 01/04/2016  . Generalized anxiety disorder 10/26/2015  . Insomnia  10/26/2015  . History of multiple pulmonary nodules 10/26/2015  . Erythema intertrigo 09/21/2015  . Furuncle 09/21/2015  . Skin breakdown 09/21/2015  . Cutaneous abscess of chest wall 09/21/2015  . Bilateral hip joint arthritis 05/27/2015  . Abnormality of gait 05/27/2015  . Chronic pain syndrome 05/27/2015  . Yeast vaginitis 05/12/2015  . AVNRT (AV nodal re-entry tachycardia) ([place]) 05/05/2015  . Fibroids 10/18/2014  . Lower abdominal pain 10/18/2014  . Low TSH level 09/17/2014  . Thyroid nodule 09/17/2014  . Bilateral leg edema 09/01/2014  . DOE (dyspnea on exertion) 09/01/2014  . Morbid obesity ([place]) 09/01/2014  . 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 DM type 2 (diabetes mellitus, type 2) ([place]) 05/20/2014  . Maceration of skin 10/27/2012  . Dehiscence of incision 10/27/2012  . Leukocytosis 10/27/2012  . Open wound 08/14/2011  . Anal fistula 01/19/2011  . Hidradenitis 10/17/2010   Starr Lake PT, DPT, LAT, ATC  07/24/16  5:09 PM      [facility] [address] [place], [place], [address] Phone: [phone]   Fax:  [phone]  Name: [name] MRN: [id number] Date of Birth: [birth year]

## 2016-07-25 ENCOUNTER — Telehealth: Payer: Self-pay

## 2016-07-25 NOTE — Telephone Encounter (Signed)
Pt is only taking   Pravastatin , she wants to know if she can have some thing for hot flashes, the otc doesn't work for her.

## 2016-07-29 NOTE — Telephone Encounter (Signed)
Not sure if I understand the message.  She should be on several medications, not just pravastatin (for cholesterol).   Regarding hot flashes, I can try her on a once nightly medication called Clonidine to see if this helps. Its is a blood pressure medication, but sometimes helps hot flashes.   Let me know.

## 2016-07-30 ENCOUNTER — Ambulatory Visit (INDEPENDENT_AMBULATORY_CARE_PROVIDER_SITE_OTHER): Payer: BC Managed Care – PPO | Admitting: *Deleted

## 2016-07-30 ENCOUNTER — Other Ambulatory Visit: Payer: Self-pay | Admitting: Medical

## 2016-07-30 DIAGNOSIS — R55 Syncope and collapse: Secondary | ICD-10-CM | POA: Diagnosis not present

## 2016-07-30 MED ORDER — CLONIDINE HCL 0.1 MG PO TABS: 0.1000 mg | ORAL_TABLET | Freq: Every day | ORAL | 2 refills | Status: DC

## 2016-07-30 NOTE — Telephone Encounter (Signed)
Called andl/m about this on her voicemail

## 2016-07-30 NOTE — Telephone Encounter (Signed)
Ok, I sent clonidine to try QHS for hot flashes.  If this isn't helping for hot flashes after 2-3 weeks, then I would recommend gynecology referral for this

## 2016-07-30 NOTE — Telephone Encounter (Signed)
You left a note on my desk about this.

## 2016-07-30 NOTE — Progress Notes (Signed)
[facility] Summary Report / Loop Recorder 

## 2016-07-31 ENCOUNTER — Ambulatory Visit: Payer: BC Managed Care – PPO

## 2016-08-01 ENCOUNTER — Encounter
Payer: BC Managed Care – PPO | Attending: Physical Medicine & Rehabilitation | Admitting: Physical Medicine & Rehabilitation

## 2016-08-01 ENCOUNTER — Encounter: Payer: Self-pay | Admitting: Physical Medicine & Rehabilitation

## 2016-08-01 VITALS — BP 115/77 | HR 94 | Resp 14

## 2016-08-01 DIAGNOSIS — Z833 Family history of diabetes mellitus: Secondary | ICD-10-CM | POA: Diagnosis not present

## 2016-08-01 DIAGNOSIS — G4730 Sleep apnea, unspecified: Secondary | ICD-10-CM | POA: Insufficient documentation

## 2016-08-01 DIAGNOSIS — Z8249 Family history of ischemic heart disease and other diseases of the circulatory system: Secondary | ICD-10-CM | POA: Diagnosis not present

## 2016-08-01 DIAGNOSIS — G8929 Other chronic pain: Secondary | ICD-10-CM | POA: Diagnosis not present

## 2016-08-01 DIAGNOSIS — G894 Chronic pain syndrome: Secondary | ICD-10-CM

## 2016-08-01 DIAGNOSIS — I471 Supraventricular tachycardia: Secondary | ICD-10-CM | POA: Diagnosis not present

## 2016-08-01 DIAGNOSIS — E119 Type 2 diabetes mellitus without complications: Secondary | ICD-10-CM | POA: Insufficient documentation

## 2016-08-01 DIAGNOSIS — M160 Bilateral primary osteoarthritis of hip: Secondary | ICD-10-CM

## 2016-08-01 DIAGNOSIS — F418 Other specified anxiety disorders: Secondary | ICD-10-CM | POA: Diagnosis not present

## 2016-08-01 DIAGNOSIS — I10 Essential (primary) hypertension: Secondary | ICD-10-CM | POA: Diagnosis not present

## 2016-08-01 DIAGNOSIS — M25551 Pain in right hip: Secondary | ICD-10-CM | POA: Insufficient documentation

## 2016-08-01 DIAGNOSIS — Z809 Family history of malignant neoplasm, unspecified: Secondary | ICD-10-CM | POA: Insufficient documentation

## 2016-08-01 DIAGNOSIS — M25552 Pain in left hip: Secondary | ICD-10-CM | POA: Diagnosis not present

## 2016-08-01 MED ORDER — HYDROCODONE-ACETAMINOPHEN 10-325 MG PO TABS: 1.0000 | ORAL_TABLET | Freq: Four times a day (QID) | ORAL | 0 refills | Status: DC | PRN

## 2016-08-01 MED ORDER — METHOCARBAMOL 500 MG PO TABS: 500.0000 mg | ORAL_TABLET | Freq: Four times a day (QID) | ORAL | 2 refills | Status: DC | PRN

## 2016-08-01 NOTE — Progress Notes (Signed)
Subjective:    Patient ID: [name], female    DOB: 01/02/1961, 56 y.o.   MRN: [id number]  HPI   [name] is here in follow up of her chronic pain. She has started in outpt PT and is making some progress with her flexibility. She feels that her pain is better too. The hydrocodone helps but is making her a little constipated. She states the pain is most prominent along her low back and upper hip area. She denies a lot of groin pain  She is working on diet and weight loss and has lost 30 lbs so far this year! She has noticed the difference in her energy and  Mobiltiy.   Pain Inventory Average Pain 7 Pain Right Now 6 My pain is dull and aching  In the last 24 hours, has pain interfered with the following? General activity 5 Relation with others 8 Enjoyment of life 4 What TIME of day is your pain at its worst? daytime Sleep (in general) Fair  Pain is worse with: walking, bending and standing Pain improves with: heat/ice and medication Relief from Meds: 6  Mobility walk without assistance how many minutes can you walk? 35 ability to climb steps?  yes do you drive?  yes transfers alone Do you have any goals in this area?  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  no  Neuro/Psych depression anxiety  Prior Studies Any changes since last visit?  no  Physicians involved in your care Any changes since last visit?  no   Family History  Problem Relation Age of Onset  . Hypertension Mother   . Alzheimer's disease Mother   . Diabetes Father   . Breast cancer Sister 65  . Anesthesia problems Neg Hx   . Hypotension Neg Hx   . Malignant hyperthermia Neg Hx   . Pseudochol deficiency Neg Hx   . Colon cancer Neg Hx    Social History   Social History  . Marital status: Divorced    Spouse name: N/A  .  Number of children: 0  . Years of education: N/A   Occupational History  . TEACHER ASSISTANT    Social History Main Topics  . Smoking status: Never Smoker  . Smokeless tobacco: Never Used  . Alcohol use No  . Drug use: No  . Sexual activity: Yes   Other Topics Concern  . None   Social History Narrative  . None   Past Surgical History:  Procedure Laterality Date  . ANAL EXAMINATION UNDER ANESTHESIA  02/21/11   anal fistula  . BREAST SURGERY  patient does not remember date of procedure   pull fluid off lft br  . ELECTROPHYSIOLOGIC STUDY N/A 05/05/2015   Procedure: SVT Ablation;  Surgeon: Will Meredith Leeds, MD;  Location: [facility];  Service: Cardiovascular;  Laterality: N/A;  . EP IMPLANTABLE DEVICE N/A 01/30/2016   Procedure: Loop Recorder Insertion;  Surgeon: Sanda Klein, MD;  Location: [place] [facility];  Service: Cardiovascular;  Laterality: N/A;  . INCISE AND DRAIN ABCESS     abscess on right thigh and buttock  . KNEE ARTHROSCOPY     left  . SHOULDER SURGERY  04/14/09   right   Past Medical History:  Diagnosis Date  . Abscess    increased drainage from abscess on buttock  .  Anal fistula   . Anxiety   . Bilateral hip pain 05/27/2015  . Chronic pain syndrome 05/27/2015  . Depression    sees [name]  . Diabetes mellitus without complication ([place])   . Hypertension   . Sleep apnea    2008- sleep study, neg. for sleep apnea   . SVT (supraventricular tachycardia) (HCC)    BP 115/77 (BP Location: Right Arm, Patient Position: Sitting, Cuff Size: Large)   Pulse 94   Resp 14   LMP 09/15/2014   SpO2 97%   Opioid Risk Score:   Fall Risk Score:  `1  Depression screen PHQ 2/9  Depression screen [facility] 2/9 02/22/2016 11/28/2015 10/26/2015 05/12/2015 09/28/2014 08/23/2014 06/23/2014  Decreased Interest 0 0 3 2 1  0 0  Down, Depressed, Hopeless 0 0 3 1 0 0 0  PHQ - 2 Score 0 0 6 3 1  0 0  Altered sleeping - - 3 3 - - -  Tired, decreased energy - - 3 1 - - -    Change in appetite - - 3 1 - - -  Feeling bad or failure about yourself  - - 3 0 - - -  Trouble concentrating - - 3 0 - - -  Moving slowly or fidgety/restless - - 0 0 - - -  Suicidal thoughts - - 0 0 - - -  PHQ-9 Score - - 21 8 - - -  Difficult doing work/chores - - Very difficult Somewhat difficult - - -  Some recent data might be hidden    Review of Systems  Constitutional: Positive for diaphoresis and unexpected weight change.  Eyes: Negative.   Respiratory: Negative.   Cardiovascular: Negative.   Gastrointestinal: Positive for constipation, nausea and vomiting.  Endocrine: Negative.   Genitourinary: Negative.   Musculoskeletal: Positive for arthralgias and gait problem.  Skin: Negative.   Allergic/Immunologic: Negative.   Psychiatric/Behavioral: Positive for dysphoric mood. The patient is nervous/anxious.   All other systems reviewed and are negative.      Objective:   Physical Exam  Gen: morbidly obese---weight is down however HENT: Normocephalic, Atraumatic Eyes: EOMI, Conj WNL Cardio: RRR Pulm: normal effort Abd: +obese. Soft, non-distended, non-tender, BS+ MSK: Gait Antalgic left more than right.. but less so today FABER equivocal Neuro: CN II-XII grossly intact.  Sensation intact to light touch in all LE dermatomes Reflexes 1+ in b/l LE Strength is5/5 in all LE myotomes Neg SLR b/l although hamstrings are tight Skin: Warm and Dry  Assessment & Plan:  56 y/o female with pmh of SVT, DM, HTN, anxiety/depression and psh of left knee scope and right shoulder scope presents in b/l hip pain, L>R.   1. 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 Left hip could be, but certainly not right hip.Likely back playing a role along with weight             -continue hydrocodone 10/325 for pain q6 prn  #120. Second rx for next month. We will continue the opioid monitoring program, this consists of regular clinic visits, examinations, urine drug screen, pill counts as well as use of [place] Controlled Substance Reporting System. [place].               -can use 2000-3000mg  tylenol daily total  -begin trial of robaxin 500mg  q6 prn #60   -  continue weight loss as below -encouraged heat/ice also -appropriate shoewear 2. GERD---protonix  -senokot s for constipation.  3. Morbid obesity working on weight loss with improved diet. Doing a great job!!              .  Followup in a month with NP. 81minutes of face to face patient care time were spent during this visit. All questions were encouraged and answered. Marland Kitchen

## 2016-08-01 NOTE — Patient Instructions (Signed)
CONTINUE WITH HOME EXERCISES ON A DAILY BASIS. PERFORM STRETCHES ON A DAILY BASIS   PLEASE FEEL FREE TO CALL OUR OFFICE WITH ANY PROBLEMS OR QUESTIONS ([phone])

## 2016-08-02 LAB — CUP PACEART REMOTE DEVICE CHECK
Implantable Pulse Generator Implant Date: 20171120
MDC IDC SESS DTM: 20180519203534

## 2016-08-03 ENCOUNTER — Ambulatory Visit: Payer: BC Managed Care – PPO | Admitting: Nurse Practitioner

## 2016-08-09 ENCOUNTER — Ambulatory Visit: Payer: BC Managed Care – PPO

## 2016-08-16 ENCOUNTER — Ambulatory Visit: Payer: BC Managed Care – PPO | Attending: Registered Nurse

## 2016-08-16 DIAGNOSIS — M5136 Other intervertebral disc degeneration, lumbar region: Secondary | ICD-10-CM

## 2016-08-16 DIAGNOSIS — R2681 Unsteadiness on feet: Secondary | ICD-10-CM

## 2016-08-16 DIAGNOSIS — R293 Abnormal posture: Secondary | ICD-10-CM | POA: Diagnosis present

## 2016-08-16 DIAGNOSIS — M62830 Muscle spasm of back: Secondary | ICD-10-CM | POA: Diagnosis present

## 2016-08-16 DIAGNOSIS — M51369 Other intervertebral disc degeneration, lumbar region without mention of lumbar back pain or lower extremity pain: Secondary | ICD-10-CM

## 2016-08-16 DIAGNOSIS — M2560 Stiffness of unspecified joint, not elsewhere classified: Secondary | ICD-10-CM

## 2016-08-16 NOTE — Patient Instructions (Signed)
From cabinet issued bridge/ shoulder bridge/ scissors 10-20 reps 1x/day hold 1-5 sec

## 2016-08-16 NOTE — Therapy (Signed)
[place], [place], [address] Phone: [phone]   Fax:  [phone]  Physical Therapy Treatment  Patient Details  Name: [name] MRN: [id number] Date of Birth: 26-Oct-1960 Referring Provider: Danella Sensing , NP  Encounter Date: 08/16/2016      PT End of Session - 08/16/16 1541    Visit Number 6   Number of Visits 12   Date for PT Re-Evaluation 09/07/16   Authorization Type [employer]   PT Start Time 0340   PT Stop Time 0420   PT Time Calculation (min) 40 min   Activity Tolerance Patient tolerated treatment well   Behavior During Therapy [facility] for tasks assessed/performed      Past Medical History:  Diagnosis Date  . Abscess    increased drainage from abscess on buttock  . Anal fistula   . Anxiety   . Bilateral hip pain 05/27/2015  . Chronic pain syndrome 05/27/2015  . Depression    sees [name]  . Diabetes mellitus without complication ([place])   . Hypertension   . Sleep apnea    2008- sleep study, neg. for sleep apnea   . SVT (supraventricular tachycardia) (HCC)     Past Surgical History:  Procedure Laterality Date  . ANAL EXAMINATION UNDER ANESTHESIA  02/21/11   anal fistula  . BREAST SURGERY  patient does not remember date of procedure   pull fluid off lft br  . ELECTROPHYSIOLOGIC STUDY N/A 05/05/2015   Procedure: SVT Ablation;  Surgeon: Will Meredith Leeds, MD;  Location: [place] [facility];  Service: Cardiovascular;  Laterality: N/A;  . EP IMPLANTABLE DEVICE N/A 01/30/2016   Procedure: Loop Recorder Insertion;  Surgeon: Sanda Klein, MD;  Location: [place] [facility];  Service: Cardiovascular;  Laterality: N/A;  . INCISE AND DRAIN ABCESS     abscess on right thigh and buttock  . KNEE ARTHROSCOPY     left  . SHOULDER SURGERY  04/14/09   right    There were no vitals filed for this visit.      Subjective Assessment - 08/16/16 1545    Subjective She reports a fall 2 weeks ago  and still bruised.  Nothing broken.  Putting band aid on back pain but no surgery for now per MD. Felt needling helped most.    Currently in Pain? Yes   Pain Score 6    Pain Location Back   Pain Orientation Left   Pain Descriptors / Indicators Dull;Aching   Pain Type Chronic pain   Pain Onset More than a month ago   Pain Frequency Constant   Aggravating Factors  stand /walk for long periods   Pain Relieving Factors meds , heat .  exercises.    Multiple Pain Sites No            [facility] PT Assessment - 08/16/16 0001      AROM   Lumbar Flexion 55   Lumbar Extension 15   Lumbar - Right Side Bend 15   Lumbar - Left Side Bend 20     Strength   Overall Strength Comments Hips strength 4+/5 today flexion and abduction  but needed cues to get leg into positon then was able to hold in correct postion.   Fair abdominal strength     Flexibility   Hamstrings 68 degrees bilaterally                     [facility] Adult PT Treatment/Exercise -  08/16/16 0001      Lumbar Exercises: Supine   Bridge 10 reps   Bridge Limitations then shoulder bridge x 10   Other Supine Lumbar Exercises reviewed all HEP and she was able to do these correctly                PT Education - 08/16/16 1616    Education provided Yes   Education Details HEP core stability   Person(s) Educated Patient   Methods Explanation;Tactile cues;Verbal cues;Handout   Comprehension Returned demonstration;Verbalized understanding          PT Short Term Goals - 08/16/16 1548      PT SHORT TERM GOAL #1   Title She will be independnet with inintal HEP    Status Achieved     PT SHORT TERM GOAL #2   Title She will report pain decreased 30% with activity at home  tasks   Baseline back pain better at times , less during day 20 % improved    Status On-going     PT SHORT TERM GOAL #3   Title She will report trunk flexion to 55 degrees           PT Long Term Goals - 08/16/16 1549      PT LONG TERM  GOAL #1   Title She will be independent with all HEP issued   Status On-going     PT LONG TERM GOAL #2   Title she will reportr 50% decr pain with general activity   Status On-going     PT LONG TERM GOAL #3   Title She will report doing  vacumming with minimal incr pain   Status On-going     PT LONG TERM GOAL #4   Title She will improve BERG score to 54/56  to demo incr safety with walking   Status Unable to assess               Plan - 08/16/16 1542    Clinical Impression Statement She appears about same as last visit. She felt DN made most difference so asked for her to be scheduled for this over next 3 weeks.  She did have some ROM  improvement.   A fall limited ability to come to PT since last visit. so will extend episode of care for 3 weeks  and if improving decide to extend further.   Clinical Presentation Stable   Rehab Potential Good   PT Frequency 2x / week   PT Duration 3 weeks   PT Treatment/Interventions Electrical Stimulation;Moist Heat;Passive range of motion;Patient/family education;Manual techniques;Therapeutic exercise;Dry needling   PT Next Visit Plan DN and modalities as needed,  manual, core strength   PT Home Exercise Plan knee to chest, LTR, hamstring stretch, piriformis stretch , fig 4 stretch, posterior pelvic tilt   Consulted and Agree with Plan of Care Patient      Patient will benefit from skilled therapeutic intervention in order to improve the following deficits and impairments:  Decreased range of motion, Difficulty walking, Pain, Decreased activity tolerance, Decreased strength, Postural dysfunction, Increased muscle spasms, Increased fascial restricitons  Visit Diagnosis: Degenerative disc disease, lumbar  Abnormal posture  Muscle spasm of back  Unsteadiness on feet  Joint stiffness of spine     Problem List Patient Active Problem List   Diagnosis Date Noted  . Hot flashes 07/09/2016  . Diabetes mellitus due to underlying  condition with complication, with long-term current use of insulin ([place]) 07/09/2016  .  Hyperlipidemia 02/29/2016  . Urinary incontinence 02/29/2016  . Urge incontinence 02/29/2016  . Hypokalemia 02/29/2016  . Heart palpitations 01/30/2016  . Syncope 01/05/2016  . Acute bilateral low back pain without sciatica 01/04/2016  . Dark urine 01/04/2016  . Edema 01/04/2016  . Generalized anxiety disorder 10/26/2015  . Insomnia 10/26/2015  . History of multiple pulmonary nodules 10/26/2015  . Erythema intertrigo 09/21/2015  . Furuncle 09/21/2015  . Skin breakdown 09/21/2015  . Cutaneous abscess of chest wall 09/21/2015  . Bilateral hip joint arthritis 05/27/2015  . Abnormality of gait 05/27/2015  . Chronic pain syndrome 05/27/2015  . Yeast vaginitis 05/12/2015  . AVNRT (AV nodal re-entry tachycardia) ([place]) 05/05/2015  . Fibroids 10/18/2014  . Lower abdominal pain 10/18/2014  . Low TSH level 09/17/2014  . Thyroid nodule 09/17/2014  . Bilateral leg edema 09/01/2014  . DOE (dyspnea on exertion) 09/01/2014  . Morbid obesity ([place]) 09/01/2014  . 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 DM type 2 (diabetes mellitus, type 2) ([place]) 05/20/2014  . Maceration of skin 10/27/2012  . Dehiscence of incision 10/27/2012  . Leukocytosis 10/27/2012  . Open wound 08/14/2011  . Anal fistula 01/19/2011  . Hidradenitis 10/17/2010    Darrel Hoover  PT 08/16/2016, 4:23 PM  [facility] [address] [place], [place], [address] Phone: [phone]   Fax:  [phone]  Name: Simya Tercero MRN: [id number] Date of Birth: 1960-05-29

## 2016-08-27 ENCOUNTER — Ambulatory Visit (INDEPENDENT_AMBULATORY_CARE_PROVIDER_SITE_OTHER): Payer: BC Managed Care – PPO | Admitting: *Deleted

## 2016-08-27 DIAGNOSIS — R55 Syncope and collapse: Secondary | ICD-10-CM

## 2016-08-28 ENCOUNTER — Ambulatory Visit: Payer: BC Managed Care – PPO | Admitting: Physical Therapy

## 2016-08-28 NOTE — Progress Notes (Signed)
[facility] Summary Report / Loop Recorder 

## 2016-09-03 ENCOUNTER — Encounter: Payer: Self-pay | Admitting: Physical Therapy

## 2016-09-03 ENCOUNTER — Other Ambulatory Visit: Payer: Self-pay

## 2016-09-03 ENCOUNTER — Ambulatory Visit: Payer: BC Managed Care – PPO | Admitting: Physical Therapy

## 2016-09-03 DIAGNOSIS — R2681 Unsteadiness on feet: Secondary | ICD-10-CM

## 2016-09-03 DIAGNOSIS — R293 Abnormal posture: Secondary | ICD-10-CM

## 2016-09-03 DIAGNOSIS — E119 Type 2 diabetes mellitus without complications: Secondary | ICD-10-CM

## 2016-09-03 DIAGNOSIS — M62830 Muscle spasm of back: Secondary | ICD-10-CM

## 2016-09-03 DIAGNOSIS — M5136 Other intervertebral disc degeneration, lumbar region: Secondary | ICD-10-CM | POA: Diagnosis not present

## 2016-09-03 MED ORDER — INSULIN DEGLUDEC-LIRAGLUTIDE 100-3.6 UNIT-MG/ML ~~LOC~~ SOPN: 40.0000 [IU] | PEN_INJECTOR | Freq: Every morning | SUBCUTANEOUS | 0 refills | Status: DC

## 2016-09-03 NOTE — Progress Notes (Signed)
2 pens dispensed to patient of Xultophy 100-3.6 mg/mL 1 pen Lot # [id number] exp 08/2016- pt was advised to use this pen first and to discard on 09/09/2016.  1 pen Lot # [id number] exp 04/2017. Victorino December

## 2016-09-03 NOTE — Therapy (Addendum)
[place], [place], [address] Phone: [phone]   Fax:  [phone]  Physical Therapy Treatment/Discharge Summary  Patient Details  Name: [name] MRN: [id number] Date of Birth: Oct 07, 1960 Referring Provider: Danella Sensing , NP  Encounter Date: 09/03/2016      PT End of Session - 09/03/16 1549    Visit Number 7   Number of Visits 12   Date for PT Re-Evaluation 09/07/16   PT Start Time 1550   PT Stop Time 1626   PT Time Calculation (min) 36 min   Activity Tolerance Patient tolerated treatment well   Behavior During Therapy [facility] for tasks assessed/performed      Past Medical History:  Diagnosis Date  . Abscess    increased drainage from abscess on buttock  . Anal fistula   . Anxiety   . Bilateral hip pain 05/27/2015  . Chronic pain syndrome 05/27/2015  . Depression    sees [name]  . Diabetes mellitus without complication ([place])   . Hypertension   . Sleep apnea    2008- sleep study, neg. for sleep apnea   . SVT (supraventricular tachycardia) (HCC)     Past Surgical History:  Procedure Laterality Date  . ANAL EXAMINATION UNDER ANESTHESIA  02/21/11   anal fistula  . BREAST SURGERY  patient does not remember date of procedure   pull fluid off lft br  . ELECTROPHYSIOLOGIC STUDY N/A 05/05/2015   Procedure: SVT Ablation;  Surgeon: Will Meredith Leeds, MD;  Location: [place] [facility];  Service: Cardiovascular;  Laterality: N/A;  . EP IMPLANTABLE DEVICE N/A 01/30/2016   Procedure: Loop Recorder Insertion;  Surgeon: Sanda Klein, MD;  Location: [facility] [facility];  Service: Cardiovascular;  Laterality: N/A;  . INCISE AND DRAIN ABCESS     abscess on right thigh and buttock  . KNEE ARTHROSCOPY     left  . SHOULDER SURGERY  04/14/09   right    There were no vitals filed for this visit.      Subjective Assessment - 09/03/16 1551    Subjective Pt reports falling again on Saturday.  Got dizzy, believes her BP was low.    Currently in Pain? Yes   Pain Score 6    Pain Location Back   Pain Orientation Left;Right  L worse than R   Pain Descriptors / Indicators Dull   Pain Frequency Constant   Aggravating Factors  standing, walking, heating pad in supine                         [facility] Adult PT Treatment/Exercise - 09/03/16 0001      Therapeutic Activites    Therapeutic Activities ADL's   ADL's bed mobility, standing/resting posture, vacuum steps with tight core     Lumbar Exercises: Aerobic   Stationary Bike nu step L4 5 min     Lumbar Exercises: Standing   Other Standing Lumbar Exercises gait training-heel toe, trunk rotation,arm swing     Lumbar Exercises: Supine   Large Ball Abdominal Isometric 10 reps;5 seconds   Large Ball Abdominal Isometric Limitations supine & standing   Large Ball Oblique Isometric 10 reps;5 seconds   Large Ball Oblique Isometric Limitations supine & standing     Manual Therapy   Soft tissue mobilization IASTM L glut max          Trigger Point Dry Needling - 09/03/16 1602    Muscles Treated Lower  Body Gluteus maximus   Gluteus Maximus Response Twitch response elicited;Palpable increased muscle length                PT Short Term Goals - 08/16/16 1548      PT SHORT TERM GOAL #1   Title She will be independnet with inintal HEP    Status Achieved     PT SHORT TERM GOAL #2   Title She will report pain decreased 30% with activity at home  tasks   Baseline back pain better at times , less during day 20 % improved    Status On-going     PT SHORT TERM GOAL #3   Title She will report trunk flexion to 55 degrees           PT Long Term Goals - 08/16/16 1549      PT LONG TERM GOAL #1   Title She will be independent with all HEP issued   Status On-going     PT LONG TERM GOAL #2   Title she will reportr 50% decr pain with general activity   Status On-going     PT LONG TERM GOAL #3   Title She  will report doing  vacumming with minimal incr pain   Status On-going     PT LONG TERM GOAL #4   Title She will improve BERG score to 54/56  to demo incr safety with walking   Status Unable to assess               Plan - 09/03/16 1632    Clinical Impression Statement Pt reported 3/10 pain following treatment today. Improved gait pattern and educated on core activation in daily activities which she was able to perform correctly. Pt tends to step and ambulate with a stiffness to her R knee which decreases force acceptance and increases overuse at proximal hip.    PT Treatment/Interventions Electrical Stimulation;Moist Heat;Passive range of motion;Patient/family education;Manual techniques;Therapeutic exercise;Dry needling   PT Next Visit Plan DN and modalities as needed,  manual, core strength   FOTO   PT Home Exercise Plan knee to chest, LTR, hamstring stretch, piriformis stretch , fig 4 stretch, posterior pelvic tilt; core engagement by iso press, heel toe gait   Consulted and Agree with Plan of Care Patient      Patient will benefit from skilled therapeutic intervention in order to improve the following deficits and impairments:  Decreased range of motion, Difficulty walking, Pain, Decreased activity tolerance, Decreased strength, Postural dysfunction, Increased muscle spasms, Increased fascial restricitons  Visit Diagnosis: Degenerative disc disease, lumbar  Abnormal posture  Muscle spasm of back  Unsteadiness on feet     Problem List Patient Active Problem List   Diagnosis Date Noted  . Hot flashes 07/09/2016  . Diabetes mellitus due to underlying condition with complication, with long-term current use of insulin ([place]) 07/09/2016  . Hyperlipidemia 02/29/2016  . Urinary incontinence 02/29/2016  . Urge incontinence 02/29/2016  . Hypokalemia 02/29/2016  . Heart palpitations 01/30/2016  . Syncope 01/05/2016  . Acute bilateral low back pain without sciatica 01/04/2016   . Dark urine 01/04/2016  . Edema 01/04/2016  . Generalized anxiety disorder 10/26/2015  . Insomnia 10/26/2015  . History of multiple pulmonary nodules 10/26/2015  . Erythema intertrigo 09/21/2015  . Furuncle 09/21/2015  . Skin breakdown 09/21/2015  . Cutaneous abscess of chest wall 09/21/2015  . Bilateral hip joint arthritis 05/27/2015  . Abnormality of gait 05/27/2015  . Chronic pain syndrome 05/27/2015  .  Yeast vaginitis 05/12/2015  . AVNRT (AV nodal re-entry tachycardia) ([place]) 05/05/2015  . Fibroids 10/18/2014  . Lower abdominal pain 10/18/2014  . Low TSH level 09/17/2014  . Thyroid nodule 09/17/2014  . Bilateral leg edema 09/01/2014  . DOE (dyspnea on exertion) 09/01/2014  . Morbid obesity ([place]) 09/01/2014  . 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 DM type 2 (diabetes mellitus, type 2) ([place]) 05/20/2014  . Maceration of skin 10/27/2012  . Dehiscence of incision 10/27/2012  . Leukocytosis 10/27/2012  . Open wound 08/14/2011  . Anal fistula 01/19/2011  . Hidradenitis 10/17/2010    Nyema Hachey C. Erol Flanagin PT, DPT 09/03/16 4:35 PM   [facility] [address] [place], [place], [address] Phone: [phone]   Fax:  [phone]  Name: Drue Camera MRN: [id number] Date of Birth: 1960-08-02   PHYSICAL THERAPY DISCHARGE SUMMARY  Visits from Start of Care: 7  Current functional level related to goals / functional outcomes: See above   Remaining deficits: See above   Education / Equipment: Anatomy of condition, POC, HEP, exercise form/rationale  Plan: Patient agrees to discharge.  Patient goals were not met. Patient is being discharged due to the patient's request.  ?????   Pt called to cancel appointments due to an accident.   Bradan Congrove C. Chasta Deshpande PT, DPT 10/18/16 6:27 PM

## 2016-09-04 LAB — CUP PACEART REMOTE DEVICE CHECK
Date Time Interrogation Session: 20180618213855
Implantable Pulse Generator Implant Date: 20171120

## 2016-09-06 ENCOUNTER — Ambulatory Visit (INDEPENDENT_AMBULATORY_CARE_PROVIDER_SITE_OTHER): Payer: BC Managed Care – PPO | Admitting: Orthopedic Surgery

## 2016-09-07 ENCOUNTER — Ambulatory Visit (AMBULATORY_SURGERY_CENTER): Payer: Self-pay

## 2016-09-07 VITALS — Ht 63.0 in | Wt 233.6 lb

## 2016-09-07 DIAGNOSIS — Z1211 Encounter for screening for malignant neoplasm of colon: Secondary | ICD-10-CM

## 2016-09-07 MED ORDER — SUPREP BOWEL PREP KIT 17.5-3.13-1.6 GM/177ML PO SOLN: 1.0000 | Freq: Once | ORAL | 0 refills | Status: AC

## 2016-09-07 NOTE — Progress Notes (Signed)
No allergies to eggs or soy No diet meds No home oxygen No past problems with anesthesia  Declined emmi 

## 2016-09-10 ENCOUNTER — Encounter: Payer: BC Managed Care – PPO | Admitting: Physical Therapy

## 2016-09-14 ENCOUNTER — Telehealth: Payer: Self-pay | Admitting: Internal Medicine

## 2016-09-14 MED ORDER — NA SULFATE-K SULFATE-MG SULF 17.5-3.13-1.6 GM/177ML PO SOLN: ORAL | 0 refills | Status: DC

## 2016-09-14 NOTE — Telephone Encounter (Signed)
Spoke with patient. Suprep $115. Sample kit left at front desk 4 th floor for patient. Pt aware.

## 2016-09-17 ENCOUNTER — Ambulatory Visit: Payer: BC Managed Care – PPO | Attending: Registered Nurse

## 2016-09-17 ENCOUNTER — Telehealth: Payer: Self-pay

## 2016-09-17 NOTE — Telephone Encounter (Signed)
Message left reminder that she missed today's appointment and next appointment was 7/16 at 3:45.

## 2016-09-25 ENCOUNTER — Encounter: Payer: Self-pay | Admitting: Internal Medicine

## 2016-09-25 ENCOUNTER — Ambulatory Visit (AMBULATORY_SURGERY_CENTER): Payer: BC Managed Care – PPO | Admitting: Internal Medicine

## 2016-09-25 VITALS — BP 118/70 | HR 72 | Temp 97.1°F | Resp 20 | Ht 63.0 in | Wt 233.0 lb

## 2016-09-25 DIAGNOSIS — Z1212 Encounter for screening for malignant neoplasm of rectum: Secondary | ICD-10-CM

## 2016-09-25 DIAGNOSIS — Z1211 Encounter for screening for malignant neoplasm of colon: Secondary | ICD-10-CM | POA: Diagnosis present

## 2016-09-25 DIAGNOSIS — Z538 Procedure and treatment not carried out for other reasons: Secondary | ICD-10-CM

## 2016-09-25 MED ORDER — SODIUM CHLORIDE 0.9 % IV SOLN: 500.0000 mL | INTRAVENOUS | Status: DC

## 2016-09-25 NOTE — Patient Instructions (Signed)
YOU HAD AN ENDOSCOPIC PROCEDURE TODAY AT [facility]:   Refer to the procedure report that was given to you for any specific questions about what was found during the examination.  If the procedure report does not answer your questions, please call your gastroenterologist to clarify.  If you requested that your care partner not be given the details of your procedure findings, then the procedure report has been included in a sealed envelope for you to review at your convenience later.  YOU SHOULD EXPECT: Some feelings of bloating in the abdomen. Passage of more gas than usual.  Walking can help get rid of the air that was put into your GI tract during the procedure and reduce the bloating. If you had a lower endoscopy (such as a colonoscopy or flexible sigmoidoscopy) you may notice spotting of blood in your stool or on the toilet paper. If you underwent a bowel prep for your procedure, you may not have a normal bowel movement for a few days.  Please Note:  You might notice some irritation and congestion in your nose or some drainage.  This is from the oxygen used during your procedure.  There is no need for concern and it should clear up in a day or so.  SYMPTOMS TO REPORT IMMEDIATELY:   Following lower endoscopy (colonoscopy or flexible sigmoidoscopy):  Excessive amounts of blood in the stool  Significant tenderness or worsening of abdominal pains  Swelling of the abdomen that is new, acute  Fever of 100F or higher  For urgent or emergent issues, a gastroenterologist can be reached at any hour by calling [phone].   DIET:  We do recommend a small meal at first, but then you may proceed to your regular diet.  Drink plenty of fluids but you should avoid alcoholic beverages for 24 hours.  ACTIVITY:  You should plan to take it easy for the rest of today and you should NOT DRIVE or use heavy machinery until tomorrow (because of the sedation medicines used during the test).     FOLLOW UP: Our staff will call the number listed on your records the next business day following your procedure to check on you and address any questions or concerns that you may have regarding the information given to you following your procedure. If we do not reach you, we will leave a message.  However, if you are feeling well and you are not experiencing any problems, there is no need to return our call.  We will assume that you have returned to your regular daily activities without incident.  If any biopsies were taken you will be contacted by phone or by letter within the next 1-3 weeks.  Please call us at [phone] if you have not heard about the biopsies in 3 weeks.   Discontinue Amitiza and try Linzess 145 mcg daily for constipation Repeat Colonoscopy as schedule   SIGNATURES/CONFIDENTIALITY: You and/or your care partner have signed paperwork which will be entered into your electronic medical record.  These signatures attest to the fact that that the information above on your After Visit Summary has been reviewed and is understood.  Full responsibility of the confidentiality of this discharge information lies with you and/or your care-partner.

## 2016-09-25 NOTE — Op Note (Addendum)
[place] Patient Name: [name] Procedure Date: 09/25/2016 7:49 AM MRN: [id number] Endoscopist: Jerene Bears , MD Age: 56 Referring MD:  Date of Birth: 09/03/1960 Gender: Female Account #: [id number] Procedure:                Colonoscopy Indications:              Screening for colorectal malignant neoplasm, This                            is the patient's first colonoscopy Medicines:                Monitored Anesthesia Care Procedure:                Pre-Anesthesia Assessment:                           - Prior to the procedure, a History and Physical                            was performed, and patient medications and                            allergies were reviewed. The patient's tolerance of                            previous anesthesia was also reviewed. The risks                            and benefits of the procedure and the sedation                            options and risks were discussed with the patient.                            All questions were answered, and informed consent                            was obtained. Prior Anticoagulants: The patient has                            taken no previous anticoagulant or antiplatelet                            agents. ASA Grade Assessment: III - A patient with                            severe systemic disease. After reviewing the risks                            and benefits, the patient was deemed in                            satisfactory condition to undergo the procedure.  After obtaining informed consent, the colonoscope                            was passed under direct vision. Throughout the                            procedure, the patient's blood pressure, pulse, and                            oxygen saturations were monitored continuously. The                            Colonoscope was introduced through the anus with                            the intention of  advancing to the cecum. The scope                            was advanced to the sigmoid colon before the                            procedure was aborted. Medications were given. The                            colonoscopy was performed without difficulty. The                            patient tolerated the procedure well. The quality                            of the bowel preparation was poor. Scope In: Scope Out: Findings:                 The digital rectal exam was normal.                           Extensive amounts of stool was found in the rectum                            and in the sigmoid colon, precluding visualization. Complications:            No immediate complications. Estimated Blood Loss:     Estimated blood loss: none. Impression:               - Preparation of the colon was poor.                           - Stool in the rectum and in the sigmoid colon.                            Exam aborted due to prep.                           - No specimens collected. Recommendation:           -  Patient has a contact number available for                            emergencies. The signs and symptoms of potential                            delayed complications were discussed with the                            patient. Return to normal activities tomorrow.                            Written discharge instructions were provided to the                            patient.                           - Resume previous diet.                           - Continue present medications, but discontinue                            Amitiza and try Linzess 145 mcg daily for                            constipation.                           - Repeat colonoscopy at the next available                            appointment because the bowel preparation was poor,                            2 day prep. Jerene Bears, MD 09/25/2016 8:31:52 AM This report has been signed electronically.

## 2016-09-25 NOTE — Progress Notes (Signed)
Patient awakening,vss,report to rn 

## 2016-09-26 ENCOUNTER — Telehealth: Payer: Self-pay

## 2016-09-26 ENCOUNTER — Telehealth: Payer: Self-pay | Admitting: *Deleted

## 2016-09-26 ENCOUNTER — Ambulatory Visit (INDEPENDENT_AMBULATORY_CARE_PROVIDER_SITE_OTHER): Payer: BC Managed Care – PPO | Admitting: *Deleted

## 2016-09-26 DIAGNOSIS — R55 Syncope and collapse: Secondary | ICD-10-CM | POA: Diagnosis not present

## 2016-09-26 NOTE — Telephone Encounter (Signed)
Called [phone] x and the phone line does not ring.  Unable to leave a message or speak with pt.  Will try to call again this afternoon. maw

## 2016-09-26 NOTE — Telephone Encounter (Signed)
  Follow up Call-  Call back number 09/25/2016 05/17/2016  Post procedure Call Back phone  # [phone]  Permission to leave phone message Yes Yes  Some recent data might be hidden     Patient questions:  Unable to call out. Second call.

## 2016-09-27 NOTE — Telephone Encounter (Signed)
Entered in error

## 2016-09-27 NOTE — Progress Notes (Signed)
[facility] Summary Report / Loop Recorder 

## 2016-10-01 ENCOUNTER — Encounter: Payer: Self-pay | Admitting: Registered Nurse

## 2016-10-01 ENCOUNTER — Other Ambulatory Visit: Payer: Self-pay | Admitting: Physician Assistant

## 2016-10-01 ENCOUNTER — Other Ambulatory Visit: Payer: Self-pay | Admitting: Cardiovascular Disease

## 2016-10-01 ENCOUNTER — Encounter: Payer: Self-pay | Admitting: Internal Medicine

## 2016-10-01 ENCOUNTER — Encounter: Payer: BC Managed Care – PPO | Attending: Physical Medicine & Rehabilitation | Admitting: Registered Nurse

## 2016-10-01 ENCOUNTER — Ambulatory Visit (AMBULATORY_SURGERY_CENTER): Payer: Self-pay | Admitting: *Deleted

## 2016-10-01 ENCOUNTER — Encounter: Payer: BC Managed Care – PPO | Admitting: Registered Nurse

## 2016-10-01 ENCOUNTER — Telehealth: Payer: Self-pay | Admitting: Registered Nurse

## 2016-10-01 VITALS — BP 111/78 | HR 90 | Temp 98.3°F

## 2016-10-01 VITALS — Ht 63.0 in | Wt 229.0 lb

## 2016-10-01 DIAGNOSIS — M545 Low back pain, unspecified: Secondary | ICD-10-CM

## 2016-10-01 DIAGNOSIS — G894 Chronic pain syndrome: Secondary | ICD-10-CM

## 2016-10-01 DIAGNOSIS — E119 Type 2 diabetes mellitus without complications: Secondary | ICD-10-CM | POA: Insufficient documentation

## 2016-10-01 DIAGNOSIS — G8929 Other chronic pain: Secondary | ICD-10-CM | POA: Diagnosis not present

## 2016-10-01 DIAGNOSIS — M25551 Pain in right hip: Secondary | ICD-10-CM | POA: Diagnosis not present

## 2016-10-01 DIAGNOSIS — Z5181 Encounter for therapeutic drug level monitoring: Secondary | ICD-10-CM

## 2016-10-01 DIAGNOSIS — M25552 Pain in left hip: Secondary | ICD-10-CM

## 2016-10-01 DIAGNOSIS — Z8249 Family history of ischemic heart disease and other diseases of the circulatory system: Secondary | ICD-10-CM | POA: Insufficient documentation

## 2016-10-01 DIAGNOSIS — Z833 Family history of diabetes mellitus: Secondary | ICD-10-CM | POA: Insufficient documentation

## 2016-10-01 DIAGNOSIS — M160 Bilateral primary osteoarthritis of hip: Secondary | ICD-10-CM

## 2016-10-01 DIAGNOSIS — G4730 Sleep apnea, unspecified: Secondary | ICD-10-CM | POA: Diagnosis not present

## 2016-10-01 DIAGNOSIS — Z809 Family history of malignant neoplasm, unspecified: Secondary | ICD-10-CM | POA: Diagnosis not present

## 2016-10-01 DIAGNOSIS — I471 Supraventricular tachycardia: Secondary | ICD-10-CM | POA: Diagnosis not present

## 2016-10-01 DIAGNOSIS — K5900 Constipation, unspecified: Secondary | ICD-10-CM

## 2016-10-01 DIAGNOSIS — F418 Other specified anxiety disorders: Secondary | ICD-10-CM | POA: Insufficient documentation

## 2016-10-01 DIAGNOSIS — Z79899 Other long term (current) drug therapy: Secondary | ICD-10-CM

## 2016-10-01 DIAGNOSIS — I10 Essential (primary) hypertension: Secondary | ICD-10-CM | POA: Diagnosis not present

## 2016-10-01 DIAGNOSIS — Z1211 Encounter for screening for malignant neoplasm of colon: Secondary | ICD-10-CM

## 2016-10-01 MED ORDER — METOCLOPRAMIDE HCL 10 MG PO TABS: ORAL_TABLET | ORAL | 0 refills | Status: DC

## 2016-10-01 MED ORDER — HYDROCODONE-ACETAMINOPHEN 10-325 MG PO TABS: 1.0000 | ORAL_TABLET | Freq: Four times a day (QID) | ORAL | 0 refills | Status: DC | PRN

## 2016-10-01 MED ORDER — LINACLOTIDE 145 MCG PO CAPS: ORAL_CAPSULE | ORAL | 6 refills | Status: DC

## 2016-10-01 NOTE — Progress Notes (Signed)
Subjective:    Patient ID: [name], female    DOB: 1960-08-15, 56 y.o.   MRN: [id number]  HPI: [name] is a 56 year old female who returns for follow up appointment and medication refill. She states her pain is located in her lower back andbilateral hips L>R. She rates her pain 7. Her current exercise regime is walking and [place] therapy [place] park.   Last UDS was performed on 05/03/2016 it was consistent. Oral swab performed today.   Pain Inventory Average Pain 6 Pain Right Now 7 My pain is dull, tingling and aching  In the last 24 hours, has pain interfered with the following? General activity 8 Relation with others 8 Enjoyment of life 8 What TIME of day is your pain at its worst? daytime Sleep (in general) .  Pain is worse with: walking, standing and unsure Pain improves with: rest, heat/ice and therapy/exercise Relief from Meds: 5  Mobility walk without assistance use a cane ability to climb steps?  yes do you drive?  yes  Function Do you have any goals in this area?  yes  Neuro/Psych No problems in this area  Prior Studies .  Physicians involved in your care .   Family History  Problem Relation Age of Onset  . Hypertension Mother   . Alzheimer's disease Mother   . Diabetes Father   . Breast cancer Sister 79  . Anesthesia problems Neg Hx   . Hypotension Neg Hx   . Malignant hyperthermia Neg Hx   . Pseudochol deficiency Neg Hx   . Colon cancer Neg Hx   . Esophageal cancer Neg Hx   . Stomach cancer Neg Hx   . Rectal cancer Neg Hx    Social History   Social History  . Marital status: Divorced    Spouse name: N/A  . Number of children: 0  . Years of education: N/A   Occupational History  . TEACHER ASSISTANT    Social History Main Topics  . Smoking status: Never Smoker  . Smokeless tobacco: Never Used  . Alcohol use No  . Drug use: No  . Sexual activity: Yes   Other Topics Concern  . Not on file   Social  History Narrative  . No narrative on file   Past Surgical History:  Procedure Laterality Date  . ANAL EXAMINATION UNDER ANESTHESIA  02/21/11   anal fistula  . BREAST SURGERY  patient does not remember date of procedure   pull fluid off lft br  . ELECTROPHYSIOLOGIC STUDY N/A 05/05/2015   Procedure: SVT Ablation;  Surgeon: Will Meredith Leeds, MD;  Location: [facility];  Service: Cardiovascular;  Laterality: N/A;  . EP IMPLANTABLE DEVICE N/A 01/30/2016   Procedure: Loop Recorder Insertion;  Surgeon: Sanda Klein, MD;  Location: [facility];  Service: Cardiovascular;  Laterality: N/A;  . INCISE AND DRAIN ABCESS     abscess on right thigh and buttock  . KNEE ARTHROSCOPY     left  . SHOULDER SURGERY  04/14/09   right   Past Medical History:  Diagnosis Date  . Abscess    increased drainage from abscess on buttock  . Anal fistula   . Anxiety   . Bilateral hip pain 05/27/2015  . Chronic pain syndrome 05/27/2015  . Depression    sees [name]  . Diabetes mellitus without complication ([place])   . Hypertension   . Sleep apnea    2008- sleep study, neg.  for sleep apnea   . SVT (supraventricular tachycardia) ([place])    LMP 09/15/2014   Opioid Risk Score:   Fall Risk Score:  `1  Depression screen PHQ 2/9  Depression screen [facility] 2/9 02/22/2016 11/28/2015 10/26/2015 05/12/2015 09/28/2014 08/23/2014 06/23/2014  Decreased Interest 0 0 3 2 1  0 0  Down, Depressed, Hopeless 0 0 3 1 0 0 0  PHQ - 2 Score 0 0 6 3 1  0 0  Altered sleeping - - 3 3 - - -  Tired, decreased energy - - 3 1 - - -  Change in appetite - - 3 1 - - -  Feeling bad or failure about yourself  - - 3 0 - - -  Trouble concentrating - - 3 0 - - -  Moving slowly or fidgety/restless - - 0 0 - - -  Suicidal thoughts - - 0 0 - - -  PHQ-9 Score - - 21 8 - - -  Difficult doing work/chores - - Very difficult Somewhat difficult - - -  Some recent data might be hidden     Review of Systems  Constitutional: Negative.     HENT: Negative.   Eyes: Negative.   Respiratory: Negative.   Cardiovascular: Negative.   Gastrointestinal: Negative.   Endocrine: Negative.   Genitourinary: Negative.   Musculoskeletal: Negative.   Skin: Negative.   Allergic/Immunologic: Negative.   Neurological: Negative.   Hematological: Negative.   Psychiatric/Behavioral: Negative.   All other systems reviewed and are negative.      Objective:   Physical Exam  Constitutional: She is oriented to person, place, and time. She appears well-developed and well-nourished.  HENT:  Head: Normocephalic and atraumatic.  Neck: Normal range of motion. Neck supple.  Cardiovascular: Normal rate and regular rhythm.   Pulmonary/Chest: Effort normal and breath sounds normal.  Musculoskeletal:  Normal Muscle Bulk and Muscle Testing Reveals: Upper Extremities: Full ROM and Muscle Strength 5/5 Lumbar Paraspinal Tenderness: L-3-L-5 Lower Extremities: Full ROM and Muscle Strength 5/5 Arises from table with ease Narrow Based Gait  Neurological: She is alert and oriented to person, place, and time.  Skin: Skin is warm and dry.  Psychiatric: She has a normal mood and affect.  Nursing note and vitals reviewed.         Assessment & Plan:  1.Chronic BilateralHip pain L>R---endstage OA of hips. .Continue HEP as tolerated and Heat and Ice Therapy Refilled:Hydrocodone 10/325 one q6 hours as needed for moderate pain #120, second script given for the following month. 10/01/2016 We will continue the opioid monitoring program, this consists of regular clinic visits, examinations, urine drug screen, pill counts as well as use of [place] Controlled Substance Reporting System. 2. Lumbar Radiculopathy: No complaints today. Continue HEP and Continue to Monitor. 10/01/2016- 3. Morbid obesity: Continue with healthy diet Regime and HEP: She has lost 50 lbs over the last 7 months. Encouraged to continue with healthy  diet regime and exercise. 10/01/2016  20 minutes of face to face patient care time was spent during this visit. All questions were encouraged and answered.   F/U in 1 month

## 2016-10-01 NOTE — Telephone Encounter (Signed)
On 10/01/2016 the  [place] was reviewed no conflict was seen on the [place] with multiple prescribers. [name] has a signed narcotic contract with our office. If there were any discrepancies this would have been reported to her physician.

## 2016-10-01 NOTE — Telephone Encounter (Signed)
Called about missed appointment and need to call if not attending next appointment

## 2016-10-01 NOTE — Progress Notes (Signed)
Patient denies any allergies to eggs or soy. Patient denies any problems with anesthesia/sedation. Patient denies any oxygen use at home and does not take any diet/weight loss medications. EMMI education declined by pt. She got last PV. Verbal order given to me by Dr.Pyrtle for Reglan 10 mg 1 tab 30 minutes before each prep dose, #3. And for 2 day Miralax/Miralax prep.

## 2016-10-04 ENCOUNTER — Ambulatory Visit: Payer: BC Managed Care – PPO | Admitting: Medical

## 2016-10-06 LAB — DRUG TOX MONITOR 1 W/CONF, ORAL FLD
AMPHETAMINES: NEGATIVE ng/mL (ref <10)
BARBITURATES: NEGATIVE ng/mL (ref <10)
BUPRENORPHINE: NEGATIVE ng/mL (ref <0.025)
Benzodiazepines: NEGATIVE ng/mL (ref <0.50)
Cocaine: NEGATIVE ng/mL (ref <2.5)
Codeine: NEGATIVE ng/mL (ref <2.5)
DIHYDROCODEINE: 27 ng/mL — AB (ref <2.5)
FENTANYL: NEGATIVE ng/mL (ref <0.10)
HEROIN METABOLITE: NEGATIVE ng/mL (ref <1.0)
HYDROCODONE: 126.6 ng/mL — AB (ref <2.5)
HYDROMORPHONE: NEGATIVE ng/mL (ref <2.5)
MDMA: NEGATIVE ng/mL (ref <10)
MEPROBAMATE: NEGATIVE ng/mL (ref <2.5)
METHADONE: NEGATIVE ng/mL (ref <5.0)
MORPHINE: NEGATIVE ng/mL (ref <2.5)
Marijuana: NEGATIVE ng/mL (ref <2.5)
Meperidine: NEGATIVE ng/mL (ref <5.0)
Nicotine Metabolite: NEGATIVE ng/mL (ref <5.0)
Norhydrocodone: 4.9 ng/mL — ABNORMAL HIGH (ref <2.5)
Noroxycodone: NEGATIVE ng/mL (ref <2.5)
Opiates: POSITIVE ng/mL — AB (ref <2.5)
Oxycodone: NEGATIVE ng/mL (ref <2.5)
Oxymorphone: NEGATIVE ng/mL (ref <2.5)
PROPOXYPHENE: NEGATIVE ng/mL (ref <5.0)
Phencyclidine: NEGATIVE ng/mL (ref <10)
TAPENTADOL: NEGATIVE ng/mL (ref <5.0)
Tramadol: NEGATIVE ng/mL (ref <5.0)
Zolpidem: NEGATIVE ng/mL (ref <5.0)

## 2016-10-06 LAB — DRUG TOX ALC METAB W/CON, ORAL FLD: ALCOHOL METABOLITE: NEGATIVE ng/mL (ref <25)

## 2016-10-08 ENCOUNTER — Encounter: Payer: Self-pay | Admitting: Internal Medicine

## 2016-10-08 ENCOUNTER — Ambulatory Visit (AMBULATORY_SURGERY_CENTER): Payer: BC Managed Care – PPO | Admitting: Internal Medicine

## 2016-10-08 VITALS — BP 140/78 | HR 72 | Temp 97.8°F | Resp 15 | Ht 63.0 in | Wt 229.0 lb

## 2016-10-08 DIAGNOSIS — D128 Benign neoplasm of rectum: Secondary | ICD-10-CM

## 2016-10-08 DIAGNOSIS — D123 Benign neoplasm of transverse colon: Secondary | ICD-10-CM

## 2016-10-08 DIAGNOSIS — Z1212 Encounter for screening for malignant neoplasm of rectum: Secondary | ICD-10-CM | POA: Diagnosis not present

## 2016-10-08 DIAGNOSIS — K635 Polyp of colon: Secondary | ICD-10-CM

## 2016-10-08 DIAGNOSIS — Z1211 Encounter for screening for malignant neoplasm of colon: Secondary | ICD-10-CM | POA: Diagnosis present

## 2016-10-08 DIAGNOSIS — D125 Benign neoplasm of sigmoid colon: Secondary | ICD-10-CM

## 2016-10-08 DIAGNOSIS — D122 Benign neoplasm of ascending colon: Secondary | ICD-10-CM

## 2016-10-08 DIAGNOSIS — K621 Rectal polyp: Secondary | ICD-10-CM | POA: Diagnosis not present

## 2016-10-08 DIAGNOSIS — D126 Benign neoplasm of colon, unspecified: Secondary | ICD-10-CM | POA: Diagnosis not present

## 2016-10-08 HISTORY — PX: COLONOSCOPY: SHX174

## 2016-10-08 MED ORDER — SODIUM CHLORIDE 0.9 % IV SOLN: 500.0000 mL | INTRAVENOUS | Status: DC

## 2016-10-08 NOTE — Op Note (Signed)
[facility] Patient Name: [name] Procedure Date: 10/08/2016 11:24 AM MRN: [id number] Endoscopist: Jerene Bears , MD Age: 56 Referring MD:  Date of Birth: February 18, 1961 Gender: Female Account #: [id number] Procedure:                Colonoscopy Indications:              Screening for colorectal malignant neoplasm, This                            is the patient's first colonoscopy, recent                            incomplete colonoscopy due to poor preparation. Medicines:                Monitored Anesthesia Care Procedure:                Pre-Anesthesia Assessment:                           - Prior to the procedure, a History and Physical                            was performed, and patient medications and                            allergies were reviewed. The patient's tolerance of                            previous anesthesia was also reviewed. The risks                            and benefits of the procedure and the sedation                            options and risks were discussed with the patient.                            All questions were answered, and informed consent                            was obtained. Prior Anticoagulants: The patient has                            taken no previous anticoagulant or antiplatelet                            agents. ASA Grade Assessment: II - A patient with                            mild systemic disease. After reviewing the risks                            and benefits, the patient was deemed in  satisfactory condition to undergo the procedure.                           After obtaining informed consent, the colonoscope                            was passed under direct vision. Throughout the                            procedure, the patient's blood pressure, pulse, and                            oxygen saturations were monitored continuously. The                            Model  PCF-[id number]) scope was introduced                            through the anus and advanced to the the cecum,                            identified by appendiceal orifice and ileocecal                            valve. The colonoscopy was performed without                            difficulty. The patient tolerated the procedure                            well. The quality of the bowel preparation was                            good. The terminal ileum, ileocecal valve,                            appendiceal orifice, and rectum were photographed.                            The bowel preparation used was 2 day MiraLax +                            MiraLax. Scope In: 11:35:51 AM Scope Out: 12:00:12 PM Scope Withdrawal Time: 0 hours 19 minutes 25 seconds  Total Procedure Duration: 0 hours 24 minutes 21 seconds  Findings:                 The digital rectal exam was normal.                           A 5 mm polyp was found in the ascending colon. The                            polyp was sessile. The polyp was removed with a  cold snare. Resection and retrieval were complete.                           A 4 mm polyp was found in the transverse colon. The                            polyp was sessile. The polyp was removed with a                            cold snare. Resection and retrieval were complete.                           Two sessile polyps were found in the distal sigmoid                            colon. The polyps were 3 to 4 mm in size. These                            polyps were removed with a cold snare. Resection                            and retrieval were complete.                           A 4 mm polyp was found in the rectum. The polyp was                            sessile. The polyp was removed with a cold snare.                            Resection and retrieval were complete.                           Three sessile polyps were found in  the rectum. The                            polyps were 2 to 3 mm in size. These polyps were                            removed with a cold biopsy forceps. Resection and                            retrieval were complete.                           A few small-mouthed diverticula were found in the                            sigmoid colon.                           Internal hemorrhoids were found during  retroflexion. The hemorrhoids were small. Complications:            No immediate complications. Estimated Blood Loss:     Estimated blood loss was minimal. Impression:               - One 5 mm polyp in the ascending colon, removed                            with a cold snare. Resected and retrieved.                           - One 4 mm polyp in the transverse colon, removed                            with a cold snare. Resected and retrieved.                           - Two 3 to 4 mm polyps in the distal sigmoid colon,                            removed with a cold snare. Resected and retrieved.                           - One 4 mm polyp in the rectum, removed with a cold                            snare. Resected and retrieved.                           - Three 2 to 3 mm polyps in the rectum, removed                            with a cold biopsy forceps. Resected and retrieved.                           - Mild diverticulosis in the sigmoid colon.                           - Internal hemorrhoids. Recommendation:           - Patient has a contact number available for                            emergencies. The signs and symptoms of potential                            delayed complications were discussed with the                            patient. Return to normal activities tomorrow.                            Written discharge instructions were provided to the  patient.                           - Resume previous diet.                            - Continue present medications.                           - Await pathology results.                           - Repeat colonoscopy is recommended for                            surveillance. The colonoscopy date will be                            determined after pathology results from today's                            exam become available for review. Jerene Bears, MD 10/08/2016 12:09:00 PM This report has been signed electronically.

## 2016-10-08 NOTE — Patient Instructions (Signed)
YOU HAD AN ENDOSCOPIC PROCEDURE TODAY AT [facility]:   Refer to the procedure report that was given to you for any specific questions about what was found during the examination.  If the procedure report does not answer your questions, please call your gastroenterologist to clarify.  If you requested that your care partner not be given the details of your procedure findings, then the procedure report has been included in a sealed envelope for you to review at your convenience later.  YOU SHOULD EXPECT: Some feelings of bloating in the abdomen. Passage of more gas than usual.  Walking can help get rid of the air that was put into your GI tract during the procedure and reduce the bloating. If you had a lower endoscopy (such as a colonoscopy or flexible sigmoidoscopy) you may notice spotting of blood in your stool or on the toilet paper. If you underwent a bowel prep for your procedure, you may not have a normal bowel movement for a few days.  Please Note:  You might notice some irritation and congestion in your nose or some drainage.  This is from the oxygen used during your procedure.  There is no need for concern and it should clear up in a day or so.  SYMPTOMS TO REPORT IMMEDIATELY:   Following lower endoscopy (colonoscopy or flexible sigmoidoscopy):  Excessive amounts of blood in the stool  Significant tenderness or worsening of abdominal pains  Swelling of the abdomen that is new, acute  Fever of 100F or higher   For urgent or emergent issues, a gastroenterologist can be reached at any hour by calling [phone].   DIET:  We do recommend a small meal at first, but then you may proceed to your regular diet.  Drink plenty of fluids but you should avoid alcoholic beverages for 24 hours.  ACTIVITY:  You should plan to take it easy for the rest of today and you should NOT DRIVE or use heavy machinery until tomorrow (because of the sedation medicines used during the test).     FOLLOW UP: Our staff will call the number listed on your records the next business day following your procedure to check on you and address any questions or concerns that you may have regarding the information given to you following your procedure. If we do not reach you, we will leave a message.  However, if you are feeling well and you are not experiencing any problems, there is no need to return our call.  We will assume that you have returned to your regular daily activities without incident.  If any biopsies were taken you will be contacted by phone or by letter within the next 1-3 weeks.  Please call us at [phone] if you have not heard about the biopsies in 3 weeks.    SIGNATURES/CONFIDENTIALITY: You and/or your care partner have signed paperwork which will be entered into your electronic medical record.  These signatures attest to the fact that that the information above on your After Visit Summary has been reviewed and is understood.  Full responsibility of the confidentiality of this discharge information lies with you and/or your care-partner.  Read all handouts given to you b  your recovery room nurse.

## 2016-10-08 NOTE — Progress Notes (Signed)
Pt's states no medical or surgical changes since previsit or office visit. 

## 2016-10-08 NOTE — Progress Notes (Signed)
Called to room to assist during endoscopic procedure.  Patient ID and intended procedure confirmed with present staff. Received instructions for my participation in the procedure from the performing physician.  

## 2016-10-08 NOTE — Progress Notes (Signed)
o recovery, report to [place], Therapist, sports, [facility]

## 2016-10-09 ENCOUNTER — Telehealth: Payer: Self-pay | Admitting: *Deleted

## 2016-10-09 LAB — CUP PACEART REMOTE DEVICE CHECK
Date Time Interrogation Session: 20180718221828
Implantable Pulse Generator Implant Date: 20171120

## 2016-10-09 NOTE — Telephone Encounter (Signed)
-----   Message from Jerene Bears, MD sent at 10/08/2016 12:41 PM EDT ----- Regarding: [facility] referral Recurrent RUQ pain, likely related to gallstones For consideration of lap chole [name], [name], [employer]

## 2016-10-09 NOTE — Progress Notes (Signed)
Carelink summary report received. Battery status OK. Normal device function. No new symptom episodes, tachy episodes, brady, or pause episodes. No new AF episodes. Monthly summary reports and ROV/PRN 

## 2016-10-09 NOTE — Telephone Encounter (Signed)
Records and request for appointment sent to [facility] for possible laparoscopic cholecystectomy.

## 2016-10-09 NOTE — Telephone Encounter (Signed)
  Follow up Call-  Call back number 10/08/2016 09/25/2016 05/17/2016  Post procedure Call Back phone  # [phone]  Permission to leave phone message Yes Yes Yes  Some recent data might be hidden     Patient questions:  Do you have a fever, pain , or abdominal swelling? No. Pain Score  0 *  Have you tolerated food without any problems? Yes.    Have you been able to return to your normal activities? Yes.    Do you have any questions about your discharge instructions: Diet   No. Medications  No. Follow up visit  No.  Do you have questions or concerns about your Care? No.  Actions: * If pain score is 4 or above: No action needed, pain <4.

## 2016-10-10 ENCOUNTER — Other Ambulatory Visit: Payer: Self-pay | Admitting: Medical

## 2016-10-10 NOTE — Telephone Encounter (Signed)
Patient has been made aware of appointment with Dr Johney Maine on 11/06/16 and she verbalizes understanding.

## 2016-10-10 NOTE — Telephone Encounter (Signed)
Patient has been scheduled to see Dr Johney Maine on 11/06/16 at 9:30 am, 9:00 am arrival. I have left a voicemail for patient to call back.

## 2016-10-11 ENCOUNTER — Encounter: Payer: Self-pay | Admitting: Internal Medicine

## 2016-10-11 ENCOUNTER — Telehealth: Payer: Self-pay | Admitting: *Deleted

## 2016-10-11 NOTE — Telephone Encounter (Signed)
Can she have a refill on this 

## 2016-10-11 NOTE — Telephone Encounter (Signed)
Oral swab drug screen was consistent for prescribed medications.  ?

## 2016-10-26 ENCOUNTER — Ambulatory Visit (INDEPENDENT_AMBULATORY_CARE_PROVIDER_SITE_OTHER): Payer: BC Managed Care – PPO | Admitting: *Deleted

## 2016-10-26 DIAGNOSIS — R55 Syncope and collapse: Secondary | ICD-10-CM | POA: Diagnosis not present

## 2016-11-02 ENCOUNTER — Telehealth: Payer: Self-pay | Admitting: Cardiovascular Disease

## 2016-11-02 LAB — CUP PACEART REMOTE DEVICE CHECK
Implantable Pulse Generator Implant Date: 20171120
MDC IDC SESS DTM: 20180817221015

## 2016-11-02 NOTE — Telephone Encounter (Signed)
New message   Pt is calling and states that she is trying to get information on her monitor. She requests a call back

## 2016-11-02 NOTE — Telephone Encounter (Signed)
S/w pt all questions answered, annual appt scheduled 02-11-17

## 2016-11-02 NOTE — Progress Notes (Signed)
Loop recorder summary report 

## 2016-11-11 ENCOUNTER — Other Ambulatory Visit: Payer: Self-pay | Admitting: Medical

## 2016-11-26 ENCOUNTER — Ambulatory Visit (INDEPENDENT_AMBULATORY_CARE_PROVIDER_SITE_OTHER): Payer: BC Managed Care – PPO | Admitting: *Deleted

## 2016-11-26 DIAGNOSIS — R55 Syncope and collapse: Secondary | ICD-10-CM | POA: Diagnosis not present

## 2016-11-26 NOTE — Progress Notes (Signed)
[facility] Summary Report / Loop Recorder 

## 2016-11-27 LAB — CUP PACEART REMOTE DEVICE CHECK
Date Time Interrogation Session: 20180916223918
MDC IDC PG IMPLANT DT: 20171120

## 2016-11-28 ENCOUNTER — Encounter: Payer: BC Managed Care – PPO | Attending: Physical Medicine & Rehabilitation | Admitting: Registered Nurse

## 2016-11-28 ENCOUNTER — Encounter: Payer: Self-pay | Admitting: Registered Nurse

## 2016-11-28 VITALS — BP 121/73 | HR 92

## 2016-11-28 DIAGNOSIS — Z833 Family history of diabetes mellitus: Secondary | ICD-10-CM | POA: Insufficient documentation

## 2016-11-28 DIAGNOSIS — I10 Essential (primary) hypertension: Secondary | ICD-10-CM | POA: Diagnosis not present

## 2016-11-28 DIAGNOSIS — G8929 Other chronic pain: Secondary | ICD-10-CM | POA: Diagnosis not present

## 2016-11-28 DIAGNOSIS — M5416 Radiculopathy, lumbar region: Secondary | ICD-10-CM

## 2016-11-28 DIAGNOSIS — M25552 Pain in left hip: Secondary | ICD-10-CM

## 2016-11-28 DIAGNOSIS — G894 Chronic pain syndrome: Secondary | ICD-10-CM

## 2016-11-28 DIAGNOSIS — M160 Bilateral primary osteoarthritis of hip: Secondary | ICD-10-CM | POA: Diagnosis not present

## 2016-11-28 DIAGNOSIS — I471 Supraventricular tachycardia: Secondary | ICD-10-CM | POA: Diagnosis not present

## 2016-11-28 DIAGNOSIS — Z8249 Family history of ischemic heart disease and other diseases of the circulatory system: Secondary | ICD-10-CM | POA: Insufficient documentation

## 2016-11-28 DIAGNOSIS — Z809 Family history of malignant neoplasm, unspecified: Secondary | ICD-10-CM | POA: Diagnosis not present

## 2016-11-28 DIAGNOSIS — G4730 Sleep apnea, unspecified: Secondary | ICD-10-CM | POA: Insufficient documentation

## 2016-11-28 DIAGNOSIS — M545 Low back pain, unspecified: Secondary | ICD-10-CM

## 2016-11-28 DIAGNOSIS — F418 Other specified anxiety disorders: Secondary | ICD-10-CM | POA: Insufficient documentation

## 2016-11-28 DIAGNOSIS — M25551 Pain in right hip: Secondary | ICD-10-CM | POA: Diagnosis not present

## 2016-11-28 DIAGNOSIS — E119 Type 2 diabetes mellitus without complications: Secondary | ICD-10-CM | POA: Insufficient documentation

## 2016-11-28 DIAGNOSIS — Z5181 Encounter for therapeutic drug level monitoring: Secondary | ICD-10-CM | POA: Diagnosis not present

## 2016-11-28 DIAGNOSIS — Z79899 Other long term (current) drug therapy: Secondary | ICD-10-CM | POA: Diagnosis not present

## 2016-11-28 MED ORDER — HYDROCODONE-ACETAMINOPHEN 10-325 MG PO TABS: 1.0000 | ORAL_TABLET | Freq: Four times a day (QID) | ORAL | 0 refills | Status: DC | PRN

## 2016-11-28 MED ORDER — METHYLPREDNISOLONE 4 MG PO TBPK: ORAL_TABLET | ORAL | 0 refills | Status: DC
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
## 2016-11-29 ENCOUNTER — Encounter: Payer: BC Managed Care – PPO | Admitting: Registered Nurse

## 2016-12-12 ENCOUNTER — Ambulatory Visit (INDEPENDENT_AMBULATORY_CARE_PROVIDER_SITE_OTHER): Payer: BC Managed Care – PPO | Admitting: Medical

## 2016-12-12 ENCOUNTER — Encounter: Payer: Self-pay | Admitting: Medical

## 2016-12-12 VITALS — BP 110/76 | HR 86 | Temp 98.4°F | Wt 232.2 lb

## 2016-12-12 DIAGNOSIS — M2540 Effusion, unspecified joint: Secondary | ICD-10-CM | POA: Diagnosis not present

## 2016-12-12 DIAGNOSIS — E088 Diabetes mellitus due to underlying condition with unspecified complications: Secondary | ICD-10-CM

## 2016-12-12 DIAGNOSIS — Z794 Long term (current) use of insulin: Secondary | ICD-10-CM

## 2016-12-12 DIAGNOSIS — M2550 Pain in unspecified joint: Secondary | ICD-10-CM | POA: Diagnosis not present

## 2016-12-12 DIAGNOSIS — H6503 Acute serous otitis media, bilateral: Secondary | ICD-10-CM

## 2016-12-12 DIAGNOSIS — B373 Candidiasis of vulva and vagina: Secondary | ICD-10-CM

## 2016-12-12 DIAGNOSIS — Z8269 Family history of other diseases of the musculoskeletal system and connective tissue: Secondary | ICD-10-CM

## 2016-12-12 DIAGNOSIS — Z23 Encounter for immunization: Secondary | ICD-10-CM

## 2016-12-12 DIAGNOSIS — M2560 Stiffness of unspecified joint, not elsewhere classified: Secondary | ICD-10-CM | POA: Diagnosis not present

## 2016-12-12 DIAGNOSIS — R202 Paresthesia of skin: Secondary | ICD-10-CM

## 2016-12-12 DIAGNOSIS — B3731 Acute candidiasis of vulva and vagina: Secondary | ICD-10-CM

## 2016-12-12 MED ORDER — AMOXICILLIN 500 MG PO TABS: ORAL_TABLET | ORAL | 0 refills | Status: DC

## 2016-12-12 MED ORDER — FLUCONAZOLE 150 MG PO TABS: ORAL_TABLET | ORAL | 0 refills | Status: DC

## 2016-12-12 MED ORDER — CETIRIZINE HCL 10 MG PO TABS: 10.0000 mg | ORAL_TABLET | Freq: Every day | ORAL | 2 refills | Status: DC

## 2016-12-12 MED ORDER — FLUTICASONE PROPIONATE 50 MCG/ACT NA SUSP: 2.0000 | Freq: Every day | NASAL | 2 refills | Status: DC

## 2016-12-12 NOTE — Progress Notes (Signed)
Subjective: Chief Complaint  Patient presents with  . wants referral    she having alot falls. having ear pain    Here for request to be referred to ortho.  Having a lot of hand swelling, legs and hands have been experiencing numbness.  She reports joint pains, low back pain, hip pains.  Pain radiates into thigh.   Having some finger joint swelling and pain.  Gets numbness through hands and fingers in the mornings.   Is quite stiff in the morning.  Sees pain management already.  Her mother had hx/o DDD in back and Rheumatoid Arthritis, diagnosed in her 64s.  Gets stiff joints every morning.   No other joint swelling.   Walking for exercise.  Has been able to lose some weight.  Is trying to stretch some.  Saw physical therapy in the past year.   Still sees her pain management doctor regularly.  She notes right ear pain, fullness in ears, some nasal congestion for last several days.  No fevers.  No sore throat, no cough, no headache.  Using nothing for symptoms. No other aggravating or relieving factors. No other complaint.  Past Medical History:  Diagnosis Date  . Abscess    increased drainage from abscess on buttock  . Anal fistula   . Anxiety   . Bilateral hip pain 05/27/2015  . Chronic pain syndrome 05/27/2015  . Depression    sees [name]  . Diabetes mellitus without complication ([place])   . Hypertension   . Sleep apnea    2008- sleep study, neg. for sleep apnea   . SVT (supraventricular tachycardia) ([place])    Current Outpatient Prescriptions on File Prior to Visit  Medication Sig Dispense Refill  . atorvastatin (LIPITOR) 20 MG tablet TAKE ONE TABLET BY MOUTH AT BEDTIME 90 tablet 1  . citalopram (CELEXA) 20 MG tablet TAKE ONE TABLET BY MOUTH ONCE DAILY 30 tablet 2  . cloNIDine (CATAPRES) 0.1 MG tablet TAKE 1 TABLET BY MOUTH AT BEDTIME 30 tablet 2  . DULoxetine (CYMBALTA) 60 MG capsule TAKE 1 CAPSULE BY MOUTH TWICE DAILY 60 capsule 2  . furosemide (LASIX) 40 MG tablet Take 1.5  tablets (60 mg total) by mouth daily. 270 tablet 3  . HYDROcodone-acetaminophen (NORCO) 10-325 MG tablet Take 1 tablet by mouth every 6 (six) hours as needed. 120 tablet 0  . Insulin Degludec-Liraglutide (XULTOPHY) 100-3.6 UNIT-MG/ML SOPN Inject 40 Units into the skin every morning. 2 pen 0  . linaclotide (LINZESS) 145 MCG CAPS capsule Linzess 145 mcg 1 daily 30 capsule 6  . metFORMIN (GLUCOPHAGE) 850 MG tablet Take 1 tablet (850 mg total) by mouth 2 (two) times daily with a meal. 180 tablet 3  . methylPREDNISolone (MEDROL DOSEPAK) 4 MG TBPK tablet Use as Directed 21 tablet 0  . pantoprazole (PROTONIX) 40 MG tablet TAKE 1 TABLET BY MOUTH TWICE DAILY BEFORE A MEAL TAKE TABLET 30-60 MINUTES BEFORE BREAKFAST AND DINNER 60 tablet 3  . pravastatin (PRAVACHOL) 20 MG tablet Take 1 tablet (20 mg total) by mouth daily. 90 tablet 1   Current Facility-Administered Medications on File Prior to Visit  Medication Dose Route Frequency Provider Last Rate Last Dose  . 0.9 %  sodium chloride infusion  500 mL Intravenous Continuous Pyrtle, Lajuan Lines, MD      . 0.9 %  sodium chloride infusion  500 mL Intravenous Continuous Pyrtle, Lajuan Lines, MD       Family History  Problem Relation Age of Onset  .  Hypertension Mother   . Alzheimer's disease Mother   . Diabetes Father   . Breast cancer Sister 2  . Anesthesia problems Neg Hx   . Hypotension Neg Hx   . Malignant hyperthermia Neg Hx   . Pseudochol deficiency Neg Hx   . Colon cancer Neg Hx   . Esophageal cancer Neg Hx   . Stomach cancer Neg Hx   . Rectal cancer Neg Hx     ROS as in subjective   Objective: BP 110/76   Pulse 86   Temp 98.4 F (36.9 C)   Wt 232 lb 3.2 oz (105.3 kg)   LMP 09/15/2014   SpO2 97%   BMI 41.13 kg/m   General appearance: alert, no distress, WD/WN HEENT: normocephalic, sclerae anicteric, TMs bulging but pink with serous fluid behind TMs, nares patent, no discharge or erythema, pharynx normal Oral cavity: MMM, no lesions Neck:  supple, no lymphadenopathy, no thyromegaly, no masses Heart: RRR, normal S1, S2, no murmurs Lungs: CTA bilaterally, no wheezes, rhonchi, or rales MSK: tender over bilat wrists, tender over right 4th finger PIP, mild swelling of same joint, otherwise nonteder, no other obvious swelling Pulses: 2+ symmetric, upper and lower extremities, normal cap refill   Assessment: Encounter Diagnoses  Name Primary?  . Joint swelling Yes  . Polyarthralgia   . Bilateral acute serous otitis media, recurrence not specified   . Need for influenza vaccination   . Need for pneumococcal vaccination   . Diabetes mellitus due to underlying condition with complication, with long-term current use of insulin ([place])   . Yeast vaginitis   . Morning stiffness of joints   . Family history of rheumatism   . Paresthesia      Plan: Joint swelling, polyarthralgia, morning stiffness, family hx/o - labs today, consider referral to ortho or rheumatology  Acute OM - begin Flonase and cetirizine.  If not much improved in next 3-4 days then begin amoxicillin  Yeast vaginitis - refilled diflucan  Update labs today for thyroid and diabetes even though she isn't here specifically for that today.  Counseled on the influenza virus vaccine.  Vaccine information sheet given.  Influenza vaccine given after consent obtained.  Counseled on the pneumococcal vaccine.  Vaccine information sheet given.  Pneumococcal vaccine Prevnar 13 given after consent obtained.   [name] was seen today for wants referral.  Diagnoses and all orders for this visit:  Joint swelling -     Rheumatoid factor -     Cyclic citrul peptide antibody, IgG -     Sedimentation rate -     Uric acid -     Comprehensive metabolic panel -     CBC  Polyarthralgia -     Rheumatoid factor -     Cyclic citrul peptide antibody, IgG -     Sedimentation rate -     Uric acid -     Comprehensive metabolic panel -     CBC  Bilateral acute serous otitis  media, recurrence not specified -     amoxicillin (AMOXIL) 500 MG tablet; 2 tablets po BID x 10 days -     cetirizine (ZYRTEC) 10 MG tablet; Take 1 tablet (10 mg total) by mouth at bedtime. -     fluticasone (FLONASE) 50 MCG/ACT nasal spray; Place 2 sprays into both nostrils daily.  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

## 2016-12-13 ENCOUNTER — Other Ambulatory Visit: Payer: Self-pay

## 2016-12-13 ENCOUNTER — Other Ambulatory Visit: Payer: Self-pay | Admitting: Medical

## 2016-12-13 MED ORDER — ALLOPURINOL 100 MG PO TABS: 100.0000 mg | ORAL_TABLET | Freq: Every day | ORAL | 2 refills | Status: DC

## 2016-12-14 ENCOUNTER — Other Ambulatory Visit: Payer: Self-pay

## 2016-12-14 LAB — URIC ACID: Uric Acid, Serum: 7.6 mg/dL — ABNORMAL HIGH (ref 2.5–7.0)

## 2016-12-14 LAB — COMPREHENSIVE METABOLIC PANEL
AG Ratio: 1.5 (calc) (ref 1.0–2.5)
ALBUMIN MSPROF: 4.1 g/dL (ref 3.6–5.1)
ALT: 12 U/L (ref 6–29)
AST: 11 U/L (ref 10–35)
Alkaline phosphatase (APISO): 109 U/L (ref 33–130)
BILIRUBIN TOTAL: 0.3 mg/dL (ref 0.2–1.2)
BUN/Creatinine Ratio: 12 (calc) (ref 6–22)
BUN: 14 mg/dL (ref 7–25)
CALCIUM: 9.9 mg/dL (ref 8.6–10.4)
CO2: 28 mmol/L (ref 20–32)
CREATININE: 1.15 mg/dL — AB (ref 0.50–1.05)
Chloride: 97 mmol/L — ABNORMAL LOW (ref 98–110)
GLOBULIN: 2.7 g/dL (ref 1.9–3.7)
Glucose, Bld: 241 mg/dL — ABNORMAL HIGH (ref 65–99)
POTASSIUM: 3.7 mmol/L (ref 3.5–5.3)
SODIUM: 137 mmol/L (ref 135–146)
TOTAL PROTEIN: 6.8 g/dL (ref 6.1–8.1)

## 2016-12-14 LAB — T4, FREE: Free T4: 1.2 ng/dL (ref 0.8–1.8)

## 2016-12-14 LAB — CBC
HEMATOCRIT: 38.6 % (ref 35.0–45.0)
HEMOGLOBIN: 12.6 g/dL (ref 11.7–15.5)
MCH: 24.6 pg — ABNORMAL LOW (ref 27.0–33.0)
MCHC: 32.6 g/dL (ref 32.0–36.0)
MCV: 75.4 fL — ABNORMAL LOW (ref 80.0–100.0)
MPV: 11.5 fL (ref 7.5–12.5)
Platelets: 428 10*3/uL — ABNORMAL HIGH (ref 140–400)
RBC: 5.12 10*6/uL — ABNORMAL HIGH (ref 3.80–5.10)
RDW: 13 % (ref 11.0–15.0)
WBC: 10.6 10*3/uL (ref 3.8–10.8)

## 2016-12-14 LAB — TEST AUTHORIZATION

## 2016-12-14 LAB — TSH: TSH: 0.19 mIU/L — ABNORMAL LOW (ref 0.40–4.50)

## 2016-12-14 LAB — HEMOGLOBIN A1C
EAG (MMOL/L): 14.7 (calc)
Hgb A1c MFr Bld: 10.9 % of total Hgb — ABNORMAL HIGH (ref <5.7)
Mean Plasma Glucose: 266 (calc)

## 2016-12-14 LAB — CYCLIC CITRUL PEPTIDE ANTIBODY, IGG: Cyclic Citrullin Peptide Ab: <16 UNITS

## 2016-12-14 LAB — SEDIMENTATION RATE: Sed Rate: 5 mm/h (ref 0–30)

## 2016-12-14 LAB — RHEUMATOID FACTOR: Rhuematoid fact SerPl-aCnc: <14 IU/mL (ref <14)

## 2016-12-14 MED ORDER — ATORVASTATIN CALCIUM 20 MG PO TABS: 20.0000 mg | ORAL_TABLET | Freq: Every day | ORAL | 3 refills | Status: DC

## 2016-12-14 MED ORDER — ALLOPURINOL 100 MG PO TABS: 100.0000 mg | ORAL_TABLET | Freq: Every day | ORAL | 2 refills | Status: DC

## 2016-12-17 ENCOUNTER — Encounter: Payer: Self-pay | Admitting: Medical

## 2016-12-17 ENCOUNTER — Ambulatory Visit (INDEPENDENT_AMBULATORY_CARE_PROVIDER_SITE_OTHER): Payer: BC Managed Care – PPO | Admitting: Medical

## 2016-12-17 ENCOUNTER — Encounter: Payer: Self-pay | Admitting: Family Medicine

## 2016-12-17 ENCOUNTER — Other Ambulatory Visit: Payer: Self-pay

## 2016-12-17 VITALS — BP 136/80 | HR 83 | Temp 98.6°F | Wt 233.4 lb

## 2016-12-17 DIAGNOSIS — E785 Hyperlipidemia, unspecified: Secondary | ICD-10-CM

## 2016-12-17 DIAGNOSIS — E118 Type 2 diabetes mellitus with unspecified complications: Secondary | ICD-10-CM

## 2016-12-17 DIAGNOSIS — R059 Cough, unspecified: Secondary | ICD-10-CM | POA: Insufficient documentation

## 2016-12-17 DIAGNOSIS — E790 Hyperuricemia without signs of inflammatory arthritis and tophaceous disease: Secondary | ICD-10-CM | POA: Insufficient documentation

## 2016-12-17 DIAGNOSIS — R05 Cough: Secondary | ICD-10-CM

## 2016-12-17 DIAGNOSIS — H9201 Otalgia, right ear: Secondary | ICD-10-CM | POA: Diagnosis not present

## 2016-12-17 DIAGNOSIS — M25541 Pain in joints of right hand: Secondary | ICD-10-CM | POA: Insufficient documentation

## 2016-12-17 DIAGNOSIS — E1165 Type 2 diabetes mellitus with hyperglycemia: Secondary | ICD-10-CM

## 2016-12-17 DIAGNOSIS — M2540 Effusion, unspecified joint: Secondary | ICD-10-CM | POA: Insufficient documentation

## 2016-12-17 DIAGNOSIS — IMO0002 Reserved for concepts with insufficient information to code with codable children: Secondary | ICD-10-CM

## 2016-12-17 NOTE — Patient Instructions (Addendum)
Recommendations:  Cholesterol STOP Pravachol You should be taking Lipitor daily at bedtime  Uric acid (gout lab) Continue Allopurinol 1 tablet daily to lower the uric acid and prevent gout Take OTC Ibuprofen 1 or 2 tablets up to twice daily for about a week to lower risk of joint pain flare up Don't take ibuprofen for weeks on end though as it can harm the kidneys  Diabetes Increase the Xultophy to 47 unites daily You can increase this 2 units every week, until morning sugars are under 130 If you get to 65 units, then call us back Continue Metformin for now Try to cut out the soda completely Avoid sweets, fried food, fast food   Cough/congestion You can use OTC Mucinex DM for symptoms Begin round of antibiotic given the recent pneumonia contacts   Plan to recheck in 30-45 days with labs

## 2016-12-17 NOTE — Progress Notes (Signed)
Subjective:  [name] is a 56 y.o. female who presents for discussion of recent abnormal labs and URI symptoms  She notes 4 day hx/o cough, congestion, right ear pain, fatigue, nausea.  Has 2 pneumonia sick contacts at work.  Here to discuss hyperlipidemia, recent labs for uric acid and diabetes marker way higher than prior.  She is taking Xultophy 45u QHS.  Sugars running high of late, 200s.  She tries to eat healthy but eats 1 piece of cake weekly, 2 sodas daily, some fast food.  otherwise eats 1/2 cup starches.  She did start Allopurinol after we called back with lab results.  Her joint swelling and pain has improved some in the hand  No other aggravating or relieving factors.  No other c/o.  The following portions of the patient's history were reviewed and updated as appropriate: allergies, current medications, past family history, past medical history, past social history, past surgical history and problem list.  ROS as in subjective  Past Medical History:  Diagnosis Date  . Abscess    increased drainage from abscess on buttock  . Anal fistula   . Anxiety   . Bilateral hip pain 05/27/2015  . Chronic pain syndrome 05/27/2015  . Depression    sees [name]  . Diabetes mellitus without complication ([place])   . Hypertension   . Sleep apnea    2008- sleep study, neg. for sleep apnea   . SVT (supraventricular tachycardia) ([place])    Current Outpatient Prescriptions on File Prior to Visit  Medication Sig Dispense Refill  . allopurinol (ZYLOPRIM) 100 MG tablet Take 1 tablet (100 mg total) by mouth daily. 30 tablet 2  . amoxicillin (AMOXIL) 500 MG tablet 2 tablets po BID x 10 days 40 tablet 0  . atorvastatin (LIPITOR) 20 MG tablet TAKE ONE TABLET BY MOUTH AT BEDTIME 90 tablet 1  . cetirizine (ZYRTEC) 10 MG tablet Take 1 tablet (10 mg total) by mouth at bedtime. 30 tablet 2  . cloNIDine (CATAPRES) 0.1 MG tablet TAKE 1 TABLET BY MOUTH AT BEDTIME 30 tablet 2  . DULoxetine  (CYMBALTA) 60 MG capsule TAKE 1 CAPSULE BY MOUTH TWICE DAILY 60 capsule 2  . fluticasone (FLONASE) 50 MCG/ACT nasal spray Place 2 sprays into both nostrils daily. 16 g 2  . furosemide (LASIX) 40 MG tablet Take 1.5 tablets (60 mg total) by mouth daily. 270 tablet 3  . HYDROcodone-acetaminophen (NORCO) 10-325 MG tablet Take 1 tablet by mouth every 6 (six) hours as needed. 120 tablet 0  . Insulin Degludec-Liraglutide (XULTOPHY) 100-3.6 UNIT-MG/ML SOPN Inject 40 Units into the skin every morning. 2 pen 0  . metFORMIN (GLUCOPHAGE) 850 MG tablet Take 1 tablet (850 mg total) by mouth 2 (two) times daily with a meal. 180 tablet 3  . pantoprazole (PROTONIX) 40 MG tablet TAKE 1 TABLET BY MOUTH TWICE DAILY BEFORE A MEAL TAKE TABLET 30-60 MINUTES BEFORE BREAKFAST AND DINNER 60 tablet 3   Current Facility-Administered Medications on File Prior to Visit  Medication Dose Route Frequency Provider Last Rate Last Dose  . 0.9 %  sodium chloride infusion  500 mL Intravenous Continuous Pyrtle, Lajuan Lines, MD      . 0.9 %  sodium chloride infusion  500 mL Intravenous Continuous Pyrtle, Lajuan Lines, MD        Objective: BP 136/80   Pulse 83   Temp 98.6 F (37 C)   Wt 233 lb 6.4 oz (105.9 kg)   LMP 09/15/2014   SpO2  98%   BMI 41.34 kg/m   General appearance: Alert, WD/WN, no distress, ill appearing                             Skin: warm, no rash, no diaphoresis                           Head: no sinus tenderness                            Eyes: conjunctiva normal, corneas clear, PERRLA                            Ears: flat TMs, right TM with mild erythema, external ear canals normal                          Nose: septum midline, turbinates swollen, with erythema and clear discharge             Mouth/throat: MMM, tongue normal, mild pharyngeal erythema                           Neck: supple, no adenopathy, no thyromegaly, nontender                          Heart: RRR, normal S1, S2, no murmurs                          Lungs: +bronchial breath sounds, +scattered rhonchi, no wheezes, no rales                Extremities: no edema, nontender      Assessment: Encounter Diagnoses  Name Primary?  Marland Kitchen Uncontrolled diabetes mellitus with complications ([place]) Yes  . Hyperuricemia   . Hyperlipidemia, unspecified hyperlipidemia type   . Arthralgia of right hand   . Joint swelling   . Cough   . Ear pain, right      Plan:  discussed her concerns, reviewed the abnormal labs from her recent visit here, current symptoms for cough/congestion, reviewed medication recommendations as below  Patient Instructions  Recommendations:  Cholesterol STOP Pravachol You should be taking Lipitor daily at bedtime  Uric acid (gout lab) Continue Allopurinol 1 tablet daily to lower the uric acid and prevent gout Take OTC Ibuprofen 1 or 2 tablets up to twice daily for about a week to lower risk of joint pain flare up Don't take ibuprofen for weeks on end though as it can harm the kidneys  Diabetes Increase the Xultophy to 47 unites daily You can increase this 2 units every week, until morning sugars are under 130 If you get to 65 units, then call us back Continue Metformin for now Try to cut out the soda completely Avoid sweets, fried food, fast food   Cough/congestion You can use OTC Mucinex DM for symptoms Begin round of antibiotic given the recent pneumonia contacts   Plan to recheck in 30-45 days with labs

## 2016-12-24 ENCOUNTER — Encounter: Payer: BC Managed Care – PPO | Attending: Physical Medicine & Rehabilitation | Admitting: Registered Nurse

## 2016-12-24 VITALS — BP 135/89 | HR 92

## 2016-12-24 DIAGNOSIS — Z833 Family history of diabetes mellitus: Secondary | ICD-10-CM | POA: Insufficient documentation

## 2016-12-24 DIAGNOSIS — M160 Bilateral primary osteoarthritis of hip: Secondary | ICD-10-CM

## 2016-12-24 DIAGNOSIS — M25552 Pain in left hip: Secondary | ICD-10-CM | POA: Insufficient documentation

## 2016-12-24 DIAGNOSIS — Z809 Family history of malignant neoplasm, unspecified: Secondary | ICD-10-CM | POA: Insufficient documentation

## 2016-12-24 DIAGNOSIS — G894 Chronic pain syndrome: Secondary | ICD-10-CM

## 2016-12-24 DIAGNOSIS — Z79899 Other long term (current) drug therapy: Secondary | ICD-10-CM | POA: Diagnosis not present

## 2016-12-24 DIAGNOSIS — M5416 Radiculopathy, lumbar region: Secondary | ICD-10-CM | POA: Diagnosis not present

## 2016-12-24 DIAGNOSIS — M25551 Pain in right hip: Secondary | ICD-10-CM | POA: Insufficient documentation

## 2016-12-24 DIAGNOSIS — F418 Other specified anxiety disorders: Secondary | ICD-10-CM | POA: Diagnosis not present

## 2016-12-24 DIAGNOSIS — Z5181 Encounter for therapeutic drug level monitoring: Secondary | ICD-10-CM

## 2016-12-24 DIAGNOSIS — G8929 Other chronic pain: Secondary | ICD-10-CM | POA: Insufficient documentation

## 2016-12-24 DIAGNOSIS — Z8249 Family history of ischemic heart disease and other diseases of the circulatory system: Secondary | ICD-10-CM | POA: Insufficient documentation

## 2016-12-24 DIAGNOSIS — G4730 Sleep apnea, unspecified: Secondary | ICD-10-CM | POA: Diagnosis not present

## 2016-12-24 DIAGNOSIS — I471 Supraventricular tachycardia: Secondary | ICD-10-CM | POA: Insufficient documentation

## 2016-12-24 DIAGNOSIS — I10 Essential (primary) hypertension: Secondary | ICD-10-CM | POA: Diagnosis not present

## 2016-12-24 DIAGNOSIS — E119 Type 2 diabetes mellitus without complications: Secondary | ICD-10-CM | POA: Diagnosis not present

## 2016-12-24 MED ORDER — HYDROCODONE-ACETAMINOPHEN 10-325 MG PO TABS: 1.0000 | ORAL_TABLET | Freq: Four times a day (QID) | ORAL | 0 refills | Status: DC | PRN
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
## 2016-12-25 ENCOUNTER — Encounter: Payer: Self-pay | Admitting: Registered Nurse

## 2016-12-25 ENCOUNTER — Telehealth: Payer: Self-pay | Admitting: Registered Nurse

## 2016-12-25 ENCOUNTER — Ambulatory Visit (INDEPENDENT_AMBULATORY_CARE_PROVIDER_SITE_OTHER): Payer: BC Managed Care – PPO | Admitting: *Deleted

## 2016-12-25 DIAGNOSIS — R55 Syncope and collapse: Secondary | ICD-10-CM

## 2016-12-25 NOTE — Telephone Encounter (Signed)
On 12/24/2016 the  [place] was reviewed no conflict was seen on the [place] with multiple prescribers. Ms. Alcott  has a signed narcotic contract with our office. If there were any discrepancies this would have been reported to her physician.

## 2016-12-25 NOTE — Progress Notes (Signed)
Subjective:    Patient ID: [name], female    DOB: 03-Aug-1960, 56 y.o.   MRN: [id number]  HPI: [name] is a 56 year old female who returns for follow up appointment and medication refill. She states her pain is located in her lower back radiating into her bilateral hips L>R. She rates her pain 8. Her current exercise regime is walking.  Ms. Skorupski Morphine equivalent is  40.00 MME.    Oral Swab was performed on 10/01/2016 it was consistent.  Pain Inventory Average Pain 8 Pain Right Now 8 My pain is sharp, dull and aching  In the last 24 hours, has pain interfered with the following? General activity 10 Relation with others 10 Enjoyment of life 10 What TIME of day is your pain at its worst? night Sleep (in general) Poor  Pain is worse with: walking, bending, standing and some activites Pain improves with: rest, heat/ice, therapy/exercise, pacing activities and medication Relief from Meds: 5  Mobility walk without assistance ability to climb steps?  yes do you drive?  yes  Function employed # of hrs/week 40 I need assistance with the following:  meal prep, household duties and shopping  Neuro/Psych No problems in this area  Prior Studies Any changes since last visit?  no  Physicians involved in your care Any changes since last visit?  no   Family History  Problem Relation Age of Onset  . Hypertension Mother   . Alzheimer's disease Mother   . Diabetes Father   . Breast cancer Sister 58  . Anesthesia problems Neg Hx   . Hypotension Neg Hx   . Malignant hyperthermia Neg Hx   . Pseudochol deficiency Neg Hx   . Colon cancer Neg Hx   . Esophageal cancer Neg Hx   . Stomach cancer Neg Hx   . Rectal cancer Neg Hx    Social History   Social History  . Marital status: Divorced    Spouse name: N/A  . Number of children: 0  . Years of education: N/A   Occupational History  . TEACHER ASSISTANT    Social History Main Topics  .  Smoking status: Never Smoker  . Smokeless tobacco: Never Used  . Alcohol use No  . Drug use: No  . Sexual activity: Yes   Other Topics Concern  . Not on file   Social History Narrative  . No narrative on file   Past Surgical History:  Procedure Laterality Date  . ANAL EXAMINATION UNDER ANESTHESIA  02/21/11   anal fistula  . BREAST SURGERY  patient does not remember date of procedure   pull fluid off lft br  . ELECTROPHYSIOLOGIC STUDY N/A 05/05/2015   Procedure: SVT Ablation;  Surgeon: Will Meredith Leeds, MD;  Location: [facility];  Service: Cardiovascular;  Laterality: N/A;  . EP IMPLANTABLE DEVICE N/A 01/30/2016   Procedure: Loop Recorder Insertion;  Surgeon: Sanda Klein, MD;  Location: [facility];  Service: Cardiovascular;  Laterality: N/A;  . INCISE AND DRAIN ABCESS     abscess on right thigh and buttock  . KNEE ARTHROSCOPY     left  . SHOULDER SURGERY  04/14/09   right   Past Medical History:  Diagnosis Date  . Abscess    increased drainage from abscess on buttock  . Anal fistula   . Anxiety   . Bilateral hip pain 05/27/2015  . Chronic pain syndrome 05/27/2015  . Depression    sees [name]  .  Diabetes mellitus without complication ([place])   . Hypertension   . Sleep apnea    2008- sleep study, neg. for sleep apnea   . SVT (supraventricular tachycardia) ([facility])    BP 135/89   Pulse 92   LMP 09/15/2014   SpO2 96%   Opioid Risk Score:  1 Fall Risk Score:  `1  Depression screen PHQ 2/9  Depression screen [facility] 2/9 12/24/2016 11/28/2016 02/22/2016 11/28/2015 10/26/2015 05/12/2015 09/28/2014  Decreased Interest 1 0 0 0 3 2 1   Down, Depressed, Hopeless 1 0 0 0 3 1 0  PHQ - 2 Score 2 0 0 0 6 3 1   Altered sleeping - - - - 3 3 -  Tired, decreased energy - - - - 3 1 -  Change in appetite - - - - 3 1 -  Feeling bad or failure about yourself  - - - - 3 0 -  Trouble concentrating - - - - 3 0 -  Moving slowly or fidgety/restless - - - - 0 0 -  Suicidal  thoughts - - - - 0 0 -  PHQ-9 Score - - - - 21 8 -  Difficult doing work/chores - - - - Very difficult Somewhat difficult -  Some recent data might be hidden     Review of Systems  Constitutional: Negative.   HENT: Negative.   Eyes: Negative.   Respiratory: Negative.   Cardiovascular: Negative.   Gastrointestinal: Negative.   Endocrine: Negative.   Genitourinary: Negative.   Musculoskeletal: Negative.   Skin: Negative.   Allergic/Immunologic: Negative.   Neurological: Negative.   Hematological: Negative.   Psychiatric/Behavioral: Negative.   All other systems reviewed and are negative.      Objective:   Physical Exam  Constitutional: She is oriented to person, place, and time. She appears well-developed and well-nourished.  HENT:  Head: Normocephalic and atraumatic.  Neck: Normal range of motion. Neck supple.  Cardiovascular: Normal rate and regular rhythm.   Pulmonary/Chest: Effort normal and breath sounds normal.  Musculoskeletal:  Normal Muscle Bulk and Muscle Testing Reveals: Upper Extremities: Full ROM and Muscle Strength 5/5 Lumbar Paraspinal Tenderness: L-3-L-5 Bilateral Greater Trochanter Tenderness: L>R Lower Extremities:Full  ROM and Muscle Strength 5/5 Arises from table with ease Narrow Based Gait  Neurological: She is alert and oriented to person, place, and time.  Skin: Skin is warm and dry.  Psychiatric: She has a normal mood and affect.  Nursing note and vitals reviewed.         Assessment & Plan:  1..Chronic BilateralHip pain L>R---endstage OA of hips..Continue HEP as tolerated and Heat and Ice Therapy Refilled:Hydrocodone 10/325 one q6 hours as needed for moderate pain #120, 12/24/2016 We will continue the opioid monitoring program, this consists of regular clinic visits, examinations, urine drug screen, pill counts as well as use of [place] Controlled Substance Reporting System. 2. Lumbar Radiculopathy:Continue HEP and Continue  to Monitor. Continue Cymbalta 12/24/2016- 4. Morbid obesity: Continue with healthy diet Regime and HEP. Encouraged to continue with healthy diet regime and exercise. 12/24/2016  20 minutes of face to face patient care time was spent during this visit. All questions were encouraged and answered.  F/U in 1 month

## 2016-12-27 NOTE — Progress Notes (Signed)
[facility] Summary Report / Loop Recorder 

## 2016-12-28 LAB — CUP PACEART REMOTE DEVICE CHECK
Implantable Pulse Generator Implant Date: 20171120
MDC IDC SESS DTM: 20181016223942

## 2017-01-01 ENCOUNTER — Encounter: Payer: Self-pay | Admitting: Medical

## 2017-01-22 ENCOUNTER — Encounter: Payer: Self-pay | Admitting: Medical

## 2017-01-23 ENCOUNTER — Other Ambulatory Visit: Payer: Self-pay | Admitting: Medical

## 2017-01-23 DIAGNOSIS — E119 Type 2 diabetes mellitus without complications: Secondary | ICD-10-CM

## 2017-01-23 MED ORDER — INSULIN DEGLUDEC-LIRAGLUTIDE 100-3.6 UNIT-MG/ML ~~LOC~~ SOPN: 70.0000 [IU] | PEN_INJECTOR | Freq: Every morning | SUBCUTANEOUS | 3 refills | Status: DC

## 2017-01-23 NOTE — Telephone Encounter (Signed)
Pt called today to inquire about this message. States she is almost out of medication and needs this addressed.

## 2017-01-24 ENCOUNTER — Ambulatory Visit (INDEPENDENT_AMBULATORY_CARE_PROVIDER_SITE_OTHER): Payer: BC Managed Care – PPO | Admitting: *Deleted

## 2017-01-24 ENCOUNTER — Other Ambulatory Visit: Payer: Self-pay

## 2017-01-24 ENCOUNTER — Encounter: Payer: Self-pay | Admitting: Registered Nurse

## 2017-01-24 ENCOUNTER — Encounter: Payer: BC Managed Care – PPO | Attending: Physical Medicine & Rehabilitation | Admitting: Registered Nurse

## 2017-01-24 VITALS — BP 121/82 | HR 91 | Resp 14

## 2017-01-24 DIAGNOSIS — Z8249 Family history of ischemic heart disease and other diseases of the circulatory system: Secondary | ICD-10-CM | POA: Diagnosis not present

## 2017-01-24 DIAGNOSIS — G4730 Sleep apnea, unspecified: Secondary | ICD-10-CM | POA: Diagnosis not present

## 2017-01-24 DIAGNOSIS — Z809 Family history of malignant neoplasm, unspecified: Secondary | ICD-10-CM | POA: Insufficient documentation

## 2017-01-24 DIAGNOSIS — F418 Other specified anxiety disorders: Secondary | ICD-10-CM | POA: Diagnosis not present

## 2017-01-24 DIAGNOSIS — M160 Bilateral primary osteoarthritis of hip: Secondary | ICD-10-CM | POA: Diagnosis not present

## 2017-01-24 DIAGNOSIS — Z5181 Encounter for therapeutic drug level monitoring: Secondary | ICD-10-CM

## 2017-01-24 DIAGNOSIS — G8929 Other chronic pain: Secondary | ICD-10-CM

## 2017-01-24 DIAGNOSIS — R55 Syncope and collapse: Secondary | ICD-10-CM

## 2017-01-24 DIAGNOSIS — G894 Chronic pain syndrome: Secondary | ICD-10-CM

## 2017-01-24 DIAGNOSIS — I471 Supraventricular tachycardia: Secondary | ICD-10-CM | POA: Insufficient documentation

## 2017-01-24 DIAGNOSIS — Z833 Family history of diabetes mellitus: Secondary | ICD-10-CM | POA: Diagnosis not present

## 2017-01-24 DIAGNOSIS — I10 Essential (primary) hypertension: Secondary | ICD-10-CM | POA: Insufficient documentation

## 2017-01-24 DIAGNOSIS — Z79899 Other long term (current) drug therapy: Secondary | ICD-10-CM | POA: Diagnosis not present

## 2017-01-24 DIAGNOSIS — M545 Low back pain, unspecified: Secondary | ICD-10-CM

## 2017-01-24 DIAGNOSIS — M25551 Pain in right hip: Secondary | ICD-10-CM | POA: Diagnosis not present

## 2017-01-24 DIAGNOSIS — M25552 Pain in left hip: Secondary | ICD-10-CM | POA: Diagnosis not present

## 2017-01-24 DIAGNOSIS — E119 Type 2 diabetes mellitus without complications: Secondary | ICD-10-CM | POA: Insufficient documentation

## 2017-01-24 MED ORDER — METHYLPREDNISOLONE 4 MG PO TBPK: ORAL_TABLET | ORAL | 0 refills | Status: DC

## 2017-01-24 MED ORDER — HYDROCODONE-ACETAMINOPHEN 10-325 MG PO TABS: 1.0000 | ORAL_TABLET | Freq: Four times a day (QID) | ORAL | 0 refills | Status: DC | PRN

## 2017-01-24 NOTE — Telephone Encounter (Signed)
Can pt have a refill on this 

## 2017-01-24 NOTE — Progress Notes (Signed)
Subjective:    Patient ID: [name], female    DOB: 01-15-1961, 56 y.o.   MRN: [id number]  HPI: [name] is a 56 year old female who returns for follow up appointment and medication refill. She states her pain is located in her lower back and  bilateral hips L>R. Also reports increase intensity of lower back pain since the  Fall, we will prescribe a medrol dose pak, she never filled the medrol dose pak from September due to financial hardship. She rates her pain 8. Her current exercise regime is walking. Also report she had a mechanical  fall on 01/26/2017, she was locking her door and lost her balanced and landed on her buttocks. She was able to pick herself up, she didn't seek medical attention.   [name] Morphine equivalent is  40.00 MME.    Oral Swab was performed on 10/01/2016 it was consistent.  Pain Inventory Average Pain 8 Pain Right Now 8 My pain is sharp, dull and aching  In the last 24 hours, has pain interfered with the following? General activity 7 Relation with others 7 Enjoyment of life 7 What TIME of day is your pain at its worst? daytime, evening Sleep (in general) Poor  Pain is worse with: walking, standing and some activites Pain improves with: medication Relief from Meds: 6  Mobility walk without assistance how many minutes can you walk? 20 ability to climb steps?  yes do you drive?  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  no  Physicians involved in your care Any changes since last visit?  no   Family History  Problem Relation Age of Onset  . Hypertension Mother   . Alzheimer's disease Mother   . Diabetes Father   . Breast cancer Sister 43  . Anesthesia problems Neg Hx   . Hypotension Neg Hx   . Malignant  hyperthermia Neg Hx   . Pseudochol deficiency Neg Hx   . Colon cancer Neg Hx   . Esophageal cancer Neg Hx   . Stomach cancer Neg Hx   . Rectal cancer Neg Hx    Social History   Socioeconomic History  . Marital status: Divorced    Spouse name: None  . Number of children: 0  . Years of education: None  . Highest education level: None  Social Needs  . Financial resource strain: None  . Food insecurity - worry: None  . Food insecurity - inability: None  . Transportation needs - medical: None  . Transportation needs - non-medical: None  Occupational History  . Occupation: TEACHER ASSISTANT  Tobacco Use  . Smoking status: Never Smoker  . Smokeless tobacco: Never Used  Substance and Sexual Activity  . Alcohol use: No  . Drug use: No  . Sexual activity: Yes  Other Topics Concern  . None  Social History Narrative  . None   Past Surgical History:  Procedure Laterality Date  . ANAL EXAMINATION UNDER ANESTHESIA  02/21/11   anal fistula  . BREAST SURGERY  patient does not remember date of procedure   pull fluid off lft br  . ELECTROPHYSIOLOGIC STUDY N/A 05/05/2015   Procedure: SVT Ablation;  Surgeon: Will Meredith Leeds, MD;  Location: [facility];  Service: Cardiovascular;  Laterality: N/A;  .  EP IMPLANTABLE DEVICE N/A 01/30/2016   Procedure: Loop Recorder Insertion;  Surgeon: Sanda Klein, MD;  Location: [place] [facility];  Service: Cardiovascular;  Laterality: N/A;  . INCISE AND DRAIN ABCESS     abscess on right thigh and buttock  . KNEE ARTHROSCOPY     left  . SHOULDER SURGERY  04/14/09   right   Past Medical History:  Diagnosis Date  . Abscess    increased drainage from abscess on buttock  . Anal fistula   . Anxiety   . Bilateral hip pain 05/27/2015  . Chronic pain syndrome 05/27/2015  . Depression    sees [name]  . Diabetes mellitus without complication ([place])   . Hypertension   . Sleep apnea    2008- sleep study, neg. for sleep apnea   . SVT  (supraventricular tachycardia) ([facility])    BP 121/82 (BP Location: Right Arm, Patient Position: Sitting, Cuff Size: Normal)   Pulse 91   Resp 14   LMP 09/15/2014   SpO2 96%   Opioid Risk Score:  1 Fall Risk Score:  `1  Depression screen PHQ 2/9  Depression screen [facility] 2/9 12/24/2016 11/28/2016 02/22/2016 11/28/2015 10/26/2015 05/12/2015 09/28/2014  Decreased Interest 1 0 0 0 3 2 1   Down, Depressed, Hopeless 1 0 0 0 3 1 0  PHQ - 2 Score 2 0 0 0 6 3 1   Altered sleeping - - - - 3 3 -  Tired, decreased energy - - - - 3 1 -  Change in appetite - - - - 3 1 -  Feeling bad or failure about yourself  - - - - 3 0 -  Trouble concentrating - - - - 3 0 -  Moving slowly or fidgety/restless - - - - 0 0 -  Suicidal thoughts - - - - 0 0 -  PHQ-9 Score - - - - 21 8 -  Difficult doing work/chores - - - - Very difficult Somewhat difficult -  Some recent data might be hidden     Review of Systems  Constitutional: Negative.   HENT: Negative.   Eyes: Negative.   Respiratory: Negative.   Cardiovascular: Negative.   Gastrointestinal: Negative.   Endocrine: Negative.   Genitourinary: Negative.   Musculoskeletal: Positive for arthralgias, back pain, gait problem and myalgias.  Skin: Negative.   Allergic/Immunologic: Negative.   Hematological: Negative.   Psychiatric/Behavioral: Negative.   All other systems reviewed and are negative.      Objective:   Physical Exam  Constitutional: She is oriented to person, place, and time. She appears well-developed and well-nourished.  HENT:  Head: Normocephalic and atraumatic.  Neck: Normal range of motion. Neck supple.  Cardiovascular: Normal rate and regular rhythm.  Pulmonary/Chest: Effort normal and breath sounds normal.  Musculoskeletal:  Normal Muscle Bulk and Muscle Testing Reveals: Upper Extremities: Full ROM and Muscle Strength 5/5 Lumbar Paraspinal Tenderness: L-3-L-5 Bilateral Greater Trochanter Tenderness: L>R Lower Extremities:Full  ROM and  Muscle Strength 5/5 Arises from table with ease Narrow Based Gait  Neurological: She is alert and oriented to person, place, and time.  Skin: Skin is warm and dry.  Psychiatric: She has a normal mood and affect.  Nursing note and vitals reviewed.         Assessment & Plan:  1. Acute Exacerbation of Chronic Low Back Pain: RX: Medrol Dose Pak. 01/24/2017 2.Chronic BilateralHip pain L>R---endstage OA of hips..Continue HEP as tolerated and Heat and Ice Therapy Refilled:Hydrocodone 10/325 one q6 hours  as needed for moderate pain #120, 01/24/2017 We will continue the opioid monitoring program, this consists of regular clinic visits, examinations, urine drug screen, pill counts as well as use of [place] Controlled Substance Reporting System. 3. Lumbar Radiculopathy:No complaints Today. Continue HEP and Continue to Monitor. Continue Cymbalta 01/24/2017- 4. Morbid obesity: Continue with healthy diet Regime and HEP. Encouraged to continue with healthy diet regime and exercise. 01/24/2017  20 minutes of face to face patient care time was spent during this visit. All questions were encouraged and answered.  F/U in 1 month

## 2017-01-25 NOTE — Progress Notes (Signed)
[facility] Summary Report / Loop Recorder 

## 2017-01-28 ENCOUNTER — Ambulatory Visit: Payer: BC Managed Care – PPO | Admitting: Medical

## 2017-01-28 ENCOUNTER — Other Ambulatory Visit: Payer: Self-pay | Admitting: Medical

## 2017-01-28 DIAGNOSIS — E119 Type 2 diabetes mellitus without complications: Secondary | ICD-10-CM

## 2017-02-08 LAB — CUP PACEART REMOTE DEVICE CHECK
Date Time Interrogation Session: 20181115233854
Implantable Pulse Generator Implant Date: 20171120

## 2017-02-11 ENCOUNTER — Other Ambulatory Visit: Payer: Self-pay | Admitting: Medical

## 2017-02-11 ENCOUNTER — Ambulatory Visit: Payer: BC Managed Care – PPO | Admitting: Cardiovascular Disease

## 2017-02-11 NOTE — Telephone Encounter (Signed)
Can pt have a refill on meds  

## 2017-02-15 ENCOUNTER — Encounter: Payer: Self-pay | Admitting: Medical

## 2017-02-15 ENCOUNTER — Ambulatory Visit: Payer: BC Managed Care – PPO | Admitting: Medical

## 2017-02-15 VITALS — BP 130/80 | HR 70 | Temp 98.4°F | Wt 236.8 lb

## 2017-02-15 DIAGNOSIS — Z794 Long term (current) use of insulin: Secondary | ICD-10-CM

## 2017-02-15 DIAGNOSIS — E1165 Type 2 diabetes mellitus with hyperglycemia: Secondary | ICD-10-CM

## 2017-02-15 DIAGNOSIS — F419 Anxiety disorder, unspecified: Secondary | ICD-10-CM

## 2017-02-15 DIAGNOSIS — E088 Diabetes mellitus due to underlying condition with unspecified complications: Secondary | ICD-10-CM

## 2017-02-15 DIAGNOSIS — E118 Type 2 diabetes mellitus with unspecified complications: Secondary | ICD-10-CM

## 2017-02-15 DIAGNOSIS — G4700 Insomnia, unspecified: Secondary | ICD-10-CM

## 2017-02-15 DIAGNOSIS — G8929 Other chronic pain: Secondary | ICD-10-CM | POA: Diagnosis not present

## 2017-02-15 DIAGNOSIS — IMO0002 Reserved for concepts with insufficient information to code with codable children: Secondary | ICD-10-CM

## 2017-02-15 MED ORDER — INSULIN GLARGINE-LIXISENATIDE 100-33 UNT-MCG/ML ~~LOC~~ SOPN: 50.0000 [IU] | PEN_INJECTOR | Freq: Every day | SUBCUTANEOUS | 3 refills | Status: DC

## 2017-02-15 MED ORDER — BUSPIRONE HCL 5 MG PO TABS: 5.0000 mg | ORAL_TABLET | Freq: Two times a day (BID) | ORAL | 0 refills | Status: DC

## 2017-02-15 NOTE — Progress Notes (Signed)
Subjective: Chief Complaint  Patient presents with  . discuss change her meds    she needs to have her insulin changed b/c cost , and going back on her valium    Here to discuss medications.  She recently found out from insurer that her current medication "insulin" will be more expensive next year.  Needs to change this.  She currently is taking [name] 50 u daily.  Her insurance said that they prefer [name], [name], and Lantus   She notes anxiety.   Worries about the future, mind races.  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  Sometimes when the kids are running around and making a lot of noise, can't handle the noise and chaos.  Still hasn't gotten over death of mother.  Works full time as Control and instrumentation engineer.   Lives at home with boyfriend.  She wants to go back on Diazepam.   Has used this in the past for anxiety.  She is currently taking Cymbalta 60 mg twice a day for pain and mood but does not think this really does much.  Mother passed away January 30, 2013.  Mother was like her best friend.  Currently denies suicidal ideation, depression.   Insomnia-does okay on clonidine nightly  Past Medical History:  Diagnosis Date  . Abscess    increased drainage from abscess on buttock  . Anal fistula   . Anxiety   . Bilateral hip pain 05/27/2015  . Chronic pain syndrome 05/27/2015  . Depression    sees Dr. Barrie [name]  . Diabetes mellitus without complication ([name])   . Hypertension   . Sleep apnea    2008- sleep study, neg. for sleep apnea   . SVT (supraventricular tachycardia) ([name])    Current Outpatient Medications on File Prior to Visit  Medication Sig Dispense Refill  . atorvastatin (LIPITOR) 20 MG tablet TAKE ONE TABLET BY MOUTH AT BEDTIME 90 tablet 1  . cloNIDine (CATAPRES) 0.1 MG tablet TAKE 1 TABLET BY MOUTH AT BEDTIME 30 tablet 2  . DULoxetine (CYMBALTA) 60 MG capsule TAKE 1 CAPSULE BY MOUTH TWICE DAILY 60 capsule 2  . fluticasone (FLONASE) 50 MCG/ACT nasal spray Place 2 sprays into both nostrils daily. 16  g 2  . furosemide (LASIX) 40 MG tablet Take 1.5 tablets (60 mg total) by mouth daily. 270 tablet 3  . HYDROcodone-acetaminophen (NORCO) 10-325 MG tablet Take 1 tablet every 6 (six) hours as needed by mouth. 120 tablet 0  . metFORMIN (GLUCOPHAGE) 850 MG tablet Take 1 tablet (850 mg total) by mouth 2 (two) times daily with a meal. 180 tablet 3  . pantoprazole (PROTONIX) 40 MG tablet TAKE 1 TABLET BY MOUTH TWICE DAILY BEFORE A MEAL TAKE TABLET 30-60 MINUTES BEFORE BREAKFAST AND DINNER 60 tablet 3  . allopurinol (ZYLOPRIM) 100 MG tablet Take 1 tablet (100 mg total) by mouth daily. (Patient not taking: Reported on 02/15/2017) 30 tablet 2   No current facility-administered medications on file prior to visit.    ROS as in subjective   Objective: BP 130/80   Pulse 70   Temp 98.4 F (36.9 C)   Wt 236 lb 12.8 oz (107.4 kg)   LMP 09/15/2014   SpO2 97%   BMI 41.95 kg/m   Gen: wd, wn, nad Psych: pleasant, good eye contact, answers questions appropriately    Assessment: Encounter Diagnoses  Name Primary?  . Insomnia, unspecified type Yes  . Anxiety   . Other chronic pain   . Diabetes mellitus due to underlying condition with complication, with  long-term current use of insulin ([place])   . Uncontrolled diabetes mellitus with complications ([place])     Plan Insomnia-continue clonidine nightly, good sleep hygiene  Anxiety- we spent the bulk of the time today discussing anxiety, ways to cope, treatment recommendations, triggers for her anxiety.  Advised against diazepam given the risk.  Begin trial of buspirone.  Discussed risks and benefits.  For now stay on Cymbalta.  Advise recheck in 2-3 weeks.  Advise she establish with a counselor.  I gave a list of resources.  Chronic pain-I reviewed her recent chronic pain consult notes.  Hydrocodone is prescribed by them.  Diabetes-continue Metformin 850 twice daily, and we will try to make a switch to [place].  [name] is no longer covered by  insurance.  [name] was seen today for discuss change her meds.  Diagnoses and all orders for this visit:  Insomnia, unspecified type  Anxiety  Other chronic pain  Diabetes mellitus due to underlying condition with complication, with long-term current use of insulin ([place])  Uncontrolled diabetes mellitus with complications ([place])  Other orders -     busPIRone (BUSPAR) 5 MG tablet; Take 1 tablet (5 mg total) by mouth 2 (two) times daily. -     Insulin Glargine-Lixisenatide (SOLIQUA) 100-33 UNT-MCG/ML SOPN; Inject 50 Units into the skin at bedtime.   F/u 2wk

## 2017-02-15 NOTE — Patient Instructions (Signed)
[facility] [address], [place], [place] [address] [phone]    [place] Clarene Reamer, therapist [phone] [address] [place], [place] [address]    [facility] [phone] [address] [place], [place] [address]   Vic Ripper, therapist [phone] [address] [place], [place] [address]   [facility] [phone] [place], [place], [place] [address]

## 2017-02-19 ENCOUNTER — Other Ambulatory Visit: Payer: Self-pay | Admitting: Cardiovascular Disease

## 2017-02-19 ENCOUNTER — Other Ambulatory Visit: Payer: Self-pay | Admitting: Physician Assistant

## 2017-02-20 ENCOUNTER — Encounter
Payer: BC Managed Care – PPO | Attending: Physical Medicine & Rehabilitation | Admitting: Physical Medicine & Rehabilitation

## 2017-02-20 ENCOUNTER — Encounter: Payer: Self-pay | Admitting: Physical Medicine & Rehabilitation

## 2017-02-20 VITALS — BP 136/80 | HR 92

## 2017-02-20 DIAGNOSIS — Z833 Family history of diabetes mellitus: Secondary | ICD-10-CM | POA: Diagnosis not present

## 2017-02-20 DIAGNOSIS — M545 Low back pain, unspecified: Secondary | ICD-10-CM

## 2017-02-20 DIAGNOSIS — M25552 Pain in left hip: Secondary | ICD-10-CM | POA: Diagnosis not present

## 2017-02-20 DIAGNOSIS — F418 Other specified anxiety disorders: Secondary | ICD-10-CM | POA: Insufficient documentation

## 2017-02-20 DIAGNOSIS — G4730 Sleep apnea, unspecified: Secondary | ICD-10-CM | POA: Diagnosis not present

## 2017-02-20 DIAGNOSIS — M160 Bilateral primary osteoarthritis of hip: Secondary | ICD-10-CM

## 2017-02-20 DIAGNOSIS — E119 Type 2 diabetes mellitus without complications: Secondary | ICD-10-CM | POA: Diagnosis not present

## 2017-02-20 DIAGNOSIS — I471 Supraventricular tachycardia: Secondary | ICD-10-CM | POA: Insufficient documentation

## 2017-02-20 DIAGNOSIS — Z8249 Family history of ischemic heart disease and other diseases of the circulatory system: Secondary | ICD-10-CM | POA: Diagnosis not present

## 2017-02-20 DIAGNOSIS — I10 Essential (primary) hypertension: Secondary | ICD-10-CM | POA: Insufficient documentation

## 2017-02-20 DIAGNOSIS — G8929 Other chronic pain: Secondary | ICD-10-CM | POA: Diagnosis not present

## 2017-02-20 DIAGNOSIS — Z809 Family history of malignant neoplasm, unspecified: Secondary | ICD-10-CM | POA: Insufficient documentation

## 2017-02-20 DIAGNOSIS — G894 Chronic pain syndrome: Secondary | ICD-10-CM

## 2017-02-20 DIAGNOSIS — M25551 Pain in right hip: Secondary | ICD-10-CM | POA: Insufficient documentation

## 2017-02-20 MED ORDER — DICLOFENAC SODIUM 50 MG PO TBEC: 50.0000 mg | DELAYED_RELEASE_TABLET | Freq: Two times a day (BID) | ORAL | 3 refills | Status: DC

## 2017-02-20 MED ORDER — HYDROCODONE-ACETAMINOPHEN 10-325 MG PO TABS: 1.0000 | ORAL_TABLET | Freq: Four times a day (QID) | ORAL | 0 refills | Status: DC | PRN

## 2017-02-20 MED ORDER — BACLOFEN 10 MG PO TABS: 10.0000 mg | ORAL_TABLET | Freq: Three times a day (TID) | ORAL | 3 refills | Status: DC | PRN
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
## 2017-02-20 NOTE — Patient Instructions (Signed)
PLEASE FEEL FREE TO CALL OUR OFFICE WITH ANY PROBLEMS OR QUESTIONS ([phone])  HAVE A HAPPY HOLIDAYS!                     ^                  ^^                ^ ^ ^             ^ ^ ^ ^ ^           ^ ^ ^ ^ ^ ^ ^        ^ ^ ^ ^ ^ ^ ^ ^ ^      ^ ^ ^ ^ ^ ^ ^ ^ ^ ^ ^                ^^^^                ^^^^                ^^^^     

## 2017-02-22 ENCOUNTER — Ambulatory Visit: Payer: BC Managed Care – PPO | Admitting: Registered Nurse

## 2017-02-22 LAB — HM DIABETES EYE EXAM

## 2017-02-25 ENCOUNTER — Ambulatory Visit (INDEPENDENT_AMBULATORY_CARE_PROVIDER_SITE_OTHER): Payer: BC Managed Care – PPO | Admitting: *Deleted

## 2017-02-25 DIAGNOSIS — R55 Syncope and collapse: Secondary | ICD-10-CM | POA: Diagnosis not present

## 2017-02-26 NOTE — Progress Notes (Signed)
[facility] Summary Report / Loop Recorder 

## 2017-03-03 ENCOUNTER — Other Ambulatory Visit: Payer: Self-pay | Admitting: Cardiovascular Disease

## 2017-03-04 ENCOUNTER — Other Ambulatory Visit: Payer: Self-pay | Admitting: *Deleted

## 2017-03-04 MED ORDER — FUROSEMIDE 40 MG PO TABS
60.0000 mg | ORAL_TABLET | Freq: Every day | ORAL | 0 refills | Status: DC
Start: 2017-03-04 — End: 2017-03-17

## 2017-03-04 NOTE — Telephone Encounter (Signed)
REFILL 

## 2017-03-13 ENCOUNTER — Other Ambulatory Visit: Payer: Self-pay | Admitting: Medical

## 2017-03-13 LAB — CUP PACEART REMOTE DEVICE CHECK
MDC IDC PG IMPLANT DT: 20171120
MDC IDC SESS DTM: 20181215233841

## 2017-03-17 ENCOUNTER — Other Ambulatory Visit: Payer: Self-pay | Admitting: Medical

## 2017-03-17 ENCOUNTER — Other Ambulatory Visit: Payer: Self-pay | Admitting: Cardiovascular Disease

## 2017-03-18 MED ORDER — BUSPIRONE HCL 5 MG PO TABS: 5.0000 mg | ORAL_TABLET | Freq: Two times a day (BID) | ORAL | 0 refills | Status: DC

## 2017-03-18 MED ORDER — FUROSEMIDE 40 MG PO TABS: 60.0000 mg | ORAL_TABLET | Freq: Every day | ORAL | 0 refills | Status: DC

## 2017-03-18 NOTE — Telephone Encounter (Signed)
Rx(s) sent to pharmacy electronically.  

## 2017-03-21 ENCOUNTER — Other Ambulatory Visit: Payer: Self-pay

## 2017-03-21 ENCOUNTER — Encounter: Payer: BC Managed Care – PPO | Attending: Physical Medicine & Rehabilitation | Admitting: Registered Nurse

## 2017-03-21 ENCOUNTER — Encounter: Payer: Self-pay | Admitting: Registered Nurse

## 2017-03-21 VITALS — BP 141/88 | HR 100

## 2017-03-21 DIAGNOSIS — Z79899 Other long term (current) drug therapy: Secondary | ICD-10-CM

## 2017-03-21 DIAGNOSIS — I10 Essential (primary) hypertension: Secondary | ICD-10-CM | POA: Insufficient documentation

## 2017-03-21 DIAGNOSIS — G894 Chronic pain syndrome: Secondary | ICD-10-CM | POA: Diagnosis not present

## 2017-03-21 DIAGNOSIS — M5416 Radiculopathy, lumbar region: Secondary | ICD-10-CM | POA: Diagnosis not present

## 2017-03-21 DIAGNOSIS — I471 Supraventricular tachycardia: Secondary | ICD-10-CM | POA: Diagnosis not present

## 2017-03-21 DIAGNOSIS — M7062 Trochanteric bursitis, left hip: Secondary | ICD-10-CM | POA: Diagnosis not present

## 2017-03-21 DIAGNOSIS — F418 Other specified anxiety disorders: Secondary | ICD-10-CM | POA: Diagnosis not present

## 2017-03-21 DIAGNOSIS — Z809 Family history of malignant neoplasm, unspecified: Secondary | ICD-10-CM | POA: Diagnosis not present

## 2017-03-21 DIAGNOSIS — Z833 Family history of diabetes mellitus: Secondary | ICD-10-CM | POA: Insufficient documentation

## 2017-03-21 DIAGNOSIS — G8929 Other chronic pain: Secondary | ICD-10-CM | POA: Diagnosis not present

## 2017-03-21 DIAGNOSIS — Z8249 Family history of ischemic heart disease and other diseases of the circulatory system: Secondary | ICD-10-CM | POA: Insufficient documentation

## 2017-03-21 DIAGNOSIS — M7061 Trochanteric bursitis, right hip: Secondary | ICD-10-CM | POA: Diagnosis not present

## 2017-03-21 DIAGNOSIS — M25552 Pain in left hip: Secondary | ICD-10-CM | POA: Insufficient documentation

## 2017-03-21 DIAGNOSIS — G4730 Sleep apnea, unspecified: Secondary | ICD-10-CM | POA: Insufficient documentation

## 2017-03-21 DIAGNOSIS — M160 Bilateral primary osteoarthritis of hip: Secondary | ICD-10-CM

## 2017-03-21 DIAGNOSIS — Z5181 Encounter for therapeutic drug level monitoring: Secondary | ICD-10-CM

## 2017-03-21 DIAGNOSIS — M25551 Pain in right hip: Secondary | ICD-10-CM | POA: Insufficient documentation

## 2017-03-21 DIAGNOSIS — E119 Type 2 diabetes mellitus without complications: Secondary | ICD-10-CM | POA: Diagnosis not present

## 2017-03-21 MED ORDER — HYDROCODONE-ACETAMINOPHEN 10-325 MG PO TABS: 1.0000 | ORAL_TABLET | Freq: Four times a day (QID) | ORAL | 0 refills | Status: DC | PRN

## 2017-03-21 NOTE — Progress Notes (Signed)
Subjective:    Patient ID: [name], female    DOB: Nov 18, 1960, 57 y.o.   MRN: [id number]  HPI: [name] is a 57 year old female who returns for follow up appointment and medication refill. She states her pain is located in her lower back radiating into her bilateral lower extremities and  bilateral hips L>R.  She rates her pain 6. Her current exercise regime is walking.  Ms. [place] Morphine equivalent is  45.33 MME.    Oral Swab was performed on 10/01/2016 it was consistent.  Pain Inventory Average Pain 8 Pain Right Now 6 My pain is sharp, dull and stabbing  In the last 24 hours, has pain interfered with the following? General activity 10 Relation with others 10 Enjoyment of life 10 What TIME of day is your pain at its worst? daytime, evening Sleep (in general) Fair  Pain is worse with: walking, standing and some activites Pain improves with: rest, heat/ice and medication Relief from Meds: 6  Mobility walk without assistance how many minutes can you walk? 20 ability to climb steps?  yes do you drive?  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  no  Physicians involved in your care Any changes since last visit?  no   Family History  Problem Relation Age of Onset  . Hypertension Mother   . Alzheimer's disease Mother   . Diabetes Father   . Breast cancer Sister 64  . Anesthesia problems Neg Hx   . Hypotension Neg Hx   . Malignant hyperthermia Neg Hx   . Pseudochol deficiency Neg Hx   . Colon cancer Neg Hx   . Esophageal cancer Neg Hx   . Stomach cancer Neg Hx   . Rectal cancer Neg Hx    Social History   Socioeconomic History  . Marital status: Divorced    Spouse name: None  . Number of children: 0  . Years of education: None  .  Highest education level: None  Social Needs  . Financial resource strain: None  . Food insecurity - worry: None  . Food insecurity - inability: None  . Transportation needs - medical: None  . Transportation needs - non-medical: None  Occupational History  . Occupation: TEACHER ASSISTANT  Tobacco Use  . Smoking status: Never Smoker  . Smokeless tobacco: Never Used  Substance and Sexual Activity  . Alcohol use: No  . Drug use: No  . Sexual activity: Yes  Other Topics Concern  . None  Social History Narrative  . None   Past Surgical History:  Procedure Laterality Date  . ANAL EXAMINATION UNDER ANESTHESIA  02/21/11   anal fistula  . BREAST SURGERY  patient does not remember date of procedure   pull fluid off lft br  . ELECTROPHYSIOLOGIC STUDY N/A 05/05/2015   Procedure: SVT Ablation;  Surgeon: Will Meredith Leeds, MD;  Location: [facility];  Service: Cardiovascular;  Laterality: N/A;  . EP IMPLANTABLE DEVICE N/A 01/30/2016   Procedure: Loop Recorder Insertion;  Surgeon: Sanda Klein, MD;  Location: [facility];  Service: Cardiovascular;  Laterality: N/A;  . INCISE AND DRAIN ABCESS     abscess on right thigh and buttock  . KNEE ARTHROSCOPY     left  . SHOULDER SURGERY  04/14/09  right   Past Medical History:  Diagnosis Date  . Abscess    increased drainage from abscess on buttock  . Anal fistula   . Anxiety   . Bilateral hip pain 05/27/2015  . Chronic pain syndrome 05/27/2015  . Depression    sees [name]  . Diabetes mellitus without complication ([place])   . Hypertension   . Sleep apnea    2008- sleep study, neg. for sleep apnea   . SVT (supraventricular tachycardia) ([facility])    LMP 09/15/2014   Opioid Risk Score:  1 Fall Risk Score:  `1  Depression screen PHQ 2/9  Depression screen [facility] 2/9 03/21/2017 12/24/2016 11/28/2016 02/22/2016 11/28/2015 10/26/2015 05/12/2015  Decreased Interest 0 1 0 0 0 3 2  Down, Depressed, Hopeless 0 1 0 0 0 3 1  PHQ - 2  Score 0 2 0 0 0 6 3  Altered sleeping - - - - - 3 3  Tired, decreased energy - - - - - 3 1  Change in appetite - - - - - 3 1  Feeling bad or failure about yourself  - - - - - 3 0  Trouble concentrating - - - - - 3 0  Moving slowly or fidgety/restless - - - - - 0 0  Suicidal thoughts - - - - - 0 0  PHQ-9 Score - - - - - 21 8  Difficult doing work/chores - - - - - Very difficult Somewhat difficult  Some recent data might be hidden     Review of Systems  Constitutional: Negative.   HENT: Negative.   Eyes: Negative.   Respiratory: Negative.   Cardiovascular: Negative.   Gastrointestinal: Negative.   Endocrine: Negative.   Genitourinary: Negative.   Musculoskeletal: Positive for arthralgias, back pain, gait problem and myalgias.  Skin: Negative.   Allergic/Immunologic: Negative.   Hematological: Negative.   Psychiatric/Behavioral: Negative.   All other systems reviewed and are negative.      Objective:   Physical Exam  Constitutional: She appears well-developed and well-nourished.  Cardiovascular: Normal rate and regular rhythm.  Pulmonary/Chest: Effort normal and breath sounds normal.  Musculoskeletal:  Normal Muscle Bulk and Muscle Testing Reveals: Upper Extremities: Full ROM and Muscle Strength 5/5 Lumbar Hypersensitivity Bilateral Greater Trochanter Tenderness: L>R Lower Extremities:Full  ROM and Muscle Strength 5/5 Left Lower Extremity Flexion Produces Pain into Left Patella Arises from table with ease Narrow Based Gait          Assessment & Plan:  1.Chronic BilateralHip pain L>R---endstage OA of hips..Continue HEP as tolerated and Heat and Ice Therapy Refilled:Hydrocodone 10/325 one q6 hours as needed for moderate pain #120, 03/21/2017 We will continue the opioid monitoring program, this consists of regular clinic visits, examinations, urine drug screen, pill counts as well as use of [place] Controlled Substance Reporting System. 2. Lumbar  Radiculitis: X-ray Results Pending: Continue Cymbalta and Continue  HEP as Tolerated.  Continue to Monitor. 03/21/2017.- 3. Morbid obesity: Continue with healthy diet Regime and HEP. Encouraged to continue with healthy diet regime and exercise. 03/21/2017. 4. Bilateral Greater Trochanteric Bursitis: Continue with Heat and Ice Therapy. Continue to Monitor. 03/21/2017.  20 minutes of face to face patient care time was spent during this visit. All questions were encouraged and answered.  F/U in 1 month

## 2017-03-25 ENCOUNTER — Ambulatory Visit (INDEPENDENT_AMBULATORY_CARE_PROVIDER_SITE_OTHER): Payer: BC Managed Care – PPO | Admitting: *Deleted

## 2017-03-25 DIAGNOSIS — R55 Syncope and collapse: Secondary | ICD-10-CM

## 2017-03-27 NOTE — Progress Notes (Signed)
[facility] Summary Report / Loop Recorder 

## 2017-04-01 ENCOUNTER — Encounter: Payer: Self-pay | Admitting: Medical

## 2017-04-03 LAB — CUP PACEART REMOTE DEVICE CHECK
MDC IDC PG IMPLANT DT: 20171120
MDC IDC SESS DTM: 20190115024027

## 2017-04-04 ENCOUNTER — Telehealth: Payer: Self-pay | Admitting: *Deleted

## 2017-04-04 NOTE — Telephone Encounter (Signed)
Pt called and stated that she sent a email on the 21st and she has not received a reply. Please advise pt

## 2017-04-04 NOTE — Telephone Encounter (Signed)
Patient asking for Zella Ball to call her after 3 PM today.  She has a very important question, that she needs to ask.  Please call back

## 2017-04-04 NOTE — Telephone Encounter (Signed)
Spoke with Ms. Younker, all questions answered. She verbalizes understanding.

## 2017-04-04 NOTE — Progress Notes (Deleted)
Please delete this note I I I I I I I I I I I I I I I I I I I i

## 2017-04-04 NOTE — Telephone Encounter (Signed)
error 

## 2017-04-08 ENCOUNTER — Encounter: Payer: Self-pay | Admitting: Medical

## 2017-04-08 ENCOUNTER — Other Ambulatory Visit: Payer: Self-pay | Admitting: Medical

## 2017-04-09 ENCOUNTER — Telehealth: Payer: Self-pay | Admitting: Registered Nurse

## 2017-04-09 ENCOUNTER — Encounter: Payer: Self-pay | Admitting: Medical

## 2017-04-09 DIAGNOSIS — G894 Chronic pain syndrome: Secondary | ICD-10-CM

## 2017-04-09 DIAGNOSIS — M160 Bilateral primary osteoarthritis of hip: Secondary | ICD-10-CM

## 2017-04-09 MED ORDER — BUSPIRONE HCL 5 MG PO TABS: 5.0000 mg | ORAL_TABLET | Freq: Two times a day (BID) | ORAL | 0 refills | Status: DC

## 2017-04-09 MED ORDER — HYDROCODONE-ACETAMINOPHEN 10-325 MG PO TABS: 1.0000 | ORAL_TABLET | Freq: Four times a day (QID) | ORAL | 0 refills | Status: DC | PRN

## 2017-04-09 NOTE — Telephone Encounter (Signed)
Ms. Capili having financial hardship, spoke with Dr. Naaman Plummer regarding the above,  he will allow her to be seen  every two months. Her MME is 44.00. She is compliant with her medications and UDS are consistent. Ms. poppy mcafee understanding.

## 2017-04-10 ENCOUNTER — Other Ambulatory Visit: Payer: Self-pay | Admitting: Medical

## 2017-04-10 MED ORDER — BUSPIRONE HCL 10 MG PO TABS: 10.0000 mg | ORAL_TABLET | Freq: Two times a day (BID) | ORAL | 1 refills | Status: DC

## 2017-04-12 ENCOUNTER — Other Ambulatory Visit: Payer: Self-pay

## 2017-04-12 ENCOUNTER — Other Ambulatory Visit (HOSPITAL_COMMUNITY)
Admission: RE | Admit: 2017-04-12 | Discharge: 2017-04-12 | Disposition: A | Discharge status: Home / Self Care | Payer: BC Managed Care – PPO | Source: Ambulatory Visit | Attending: Obstetrics and Gynecology | Admitting: Obstetrics and Gynecology

## 2017-04-12 ENCOUNTER — Ambulatory Visit: Payer: BC Managed Care – PPO | Admitting: Obstetrics and Gynecology

## 2017-04-12 ENCOUNTER — Encounter: Payer: Self-pay | Admitting: Obstetrics and Gynecology

## 2017-04-12 VITALS — BP 142/84 | HR 108 | Ht 63.0 in | Wt 234.9 lb

## 2017-04-12 DIAGNOSIS — Z124 Encounter for screening for malignant neoplasm of cervix: Secondary | ICD-10-CM

## 2017-04-12 DIAGNOSIS — N841 Polyp of cervix uteri: Secondary | ICD-10-CM | POA: Insufficient documentation

## 2017-04-12 DIAGNOSIS — N950 Postmenopausal bleeding: Secondary | ICD-10-CM | POA: Insufficient documentation

## 2017-04-12 NOTE — Progress Notes (Signed)
BP re-do on left arm,sitting:156/86 p:106

## 2017-04-12 NOTE — Progress Notes (Signed)
GYNECOLOGY OFFICE VISIT NOTE  History:  57 y.o. G1P0100 here today for vaginal bleeding. Patient states that since December she has have abnormal bleeding x3. She will get a sharp RLQ pain and then bleeding. The bleeding will last for 24hrs and the pain is sudden and not long lasting. She states that she would then go to the bathroom and would have bleeding. She denies any abnormal vaginal discharge, fevers, dysuria, or other concerns. Patient is a G1P0. States she has not had a period in over 3 years.   Past Medical History:  Diagnosis Date  . Abscess    increased drainage from abscess on buttock  . Anal fistula   . Anxiety   . Bilateral hip pain 05/27/2015  . Chronic pain syndrome 05/27/2015  . Depression    sees [name]  . Diabetes mellitus without complication ([place])   . Hypertension   . Sleep apnea    2008- sleep study, neg. for sleep apnea   . SVT (supraventricular tachycardia) (HCC)     Past Surgical History:  Procedure Laterality Date  . ANAL EXAMINATION UNDER ANESTHESIA  02/21/11   anal fistula  . BREAST SURGERY  patient does not remember date of procedure   pull fluid off lft br  . ELECTROPHYSIOLOGIC STUDY N/A 05/05/2015   Procedure: SVT Ablation;  Surgeon: Will Meredith Leeds, MD;  Location: [facility];  Service: Cardiovascular;  Laterality: N/A;  . EP IMPLANTABLE DEVICE N/A 01/30/2016   Procedure: Loop Recorder Insertion;  Surgeon: Sanda Klein, MD;  Location: [facility];  Service: Cardiovascular;  Laterality: N/A;  . INCISE AND DRAIN ABCESS     abscess on right thigh and buttock  . KNEE ARTHROSCOPY     left  . SHOULDER SURGERY  04/14/09   right    The following portions of the patient's history were reviewed and updated as appropriate: allergies, current medications, past family history, past medical history, past social history, past surgical history and problem list.   Health Maintenance:  Due for pap today.   Review of Systems:    Pertinent items noted in HPI and remainder of comprehensive ROS otherwise negative.   Objective:  Physical Exam BP (!) 142/84 (BP Location: Right Arm, Patient Position: Sitting)   Pulse (!) 108   Ht 5\' 3"  (1.6 m)   Wt 234 lb 14.4 oz (106.5 kg)   LMP 02/09/2017   BMI 41.61 kg/m  CONSTITUTIONAL: Well-developed, well-nourished obese female in no acute distress.  HENT:  Normocephalic, atraumatic. PSYCHIATRIC: Normal mood and affect. Normal behavior. CARDIOVASCULAR: Normal heart rate noted RESPIRATORY: Effort normal, no problems with respiration noted ABDOMEN: Soft, protuberant, tenderness in RLQ, no guarding or rebound or distention noted.   PELVIC: Normal appearing external genitalia; normal appearing vaginal mucosa and cervix.  No abnormal discharge or blood noted.  Normal uterine size, no other palpable masses, no uterine or adnexal tenderness. MUSCULOSKELETAL: Normal range of motion. No edema noted.  Labs and Imaging Results for orders placed or performed in visit on 04/12/17  Urinalysis  Result Value Ref Range   Specific Gravity, UA 1.015 1.005 - 1.030   pH, UA 5.5 5.0 - 7.5   Color, UA Yellow Yellow   Appearance Ur Clear Clear   Leukocytes, UA Negative Negative   Protein, UA Negative Negative/Trace   Glucose, UA 3+ (A) Negative   Ketones, UA Negative Negative   RBC, UA Trace (A) Negative   Bilirubin, UA Negative Negative   Urobilinogen, Ur  0.2 0.2 - 1.0 mg/dL   Nitrite, UA Negative Negative     Assessment & Plan:  1. Postmenopausal bleeding UA collected as story is concerning for possible hematuria. Collected pap smear and endometrial biopsy (procedure note below). [place] scheduled to examine anatomy for causes of bleeding.  - US PELVIC COMPLETE WITH TRANSVAGINAL; Future - Urinalysis - Cytology - PAP - Surgical pathology  Please refer to After Visit Summary for other counseling recommendations.   Return in about 2 weeks (around 04/26/2017).  Luiz Blare, DO OB  Fellow Center for [facility]   Endometrial Biopsy Procedure Note  Pre-operative Diagnosis: Postmenopausal bleeding  Post-operative Diagnosis: same  Indications: abnormal uterine bleeding, postmenopausal bleeding  Procedure Details   Urine pregnancy test was not done.  The risks (including infection, bleeding, pain, and uterine perforation) and benefits of the procedure were explained to the patient and Written informed consent was obtained.  Antibiotic prophylaxis against endocarditis was not indicated.   The patient was placed in the dorsal lithotomy position.  Bimanual exam showed the uterus to be in the retroflexed position.  A long Pederson speculum inserted in the vagina, and the cervix prepped with povidone iodine.  Endocervical curettage with a Kevorkian curette was not performed.   A sharp tenaculum was applied to the anterior lip of the cervix for stabilization.  A sterile uterine sound was used to sound the uterus to a depth of 8cm.  A Pipelle endometrial aspirator was used to sample the endometrium.  Sample was sent for pathologic examination.  Condition: Stable  Complications: None  Plan:  The patient was advised to call for any fever or for prolonged or severe pain or bleeding. She was advised to use OTC ibuprofen as needed for mild to moderate pain. She was advised to avoid vaginal intercourse for 48 hours or until the bleeding has completely stopped.

## 2017-04-12 NOTE — Patient Instructions (Signed)

## 2017-04-13 ENCOUNTER — Encounter: Payer: Self-pay | Admitting: Obstetrics and Gynecology

## 2017-04-13 LAB — URINALYSIS
Bilirubin, UA: NEGATIVE
KETONES UA: NEGATIVE
LEUKOCYTES UA: NEGATIVE
NITRITE UA: NEGATIVE
PROTEIN UA: NEGATIVE
SPEC GRAV UA: 1.015 (ref 1.005–1.030)
Urobilinogen, Ur: 0.2 mg/dL (ref 0.2–1.0)
pH, UA: 5.5 (ref 5.0–7.5)

## 2017-04-14 ENCOUNTER — Encounter: Payer: Self-pay | Admitting: Obstetrics and Gynecology

## 2017-04-15 LAB — POCT URINALYSIS DIP (DEVICE)
BILIRUBIN URINE: NEGATIVE
GLUCOSE, UA: 500 mg/dL — AB
Ketones, ur: NEGATIVE mg/dL
LEUKOCYTES UA: NEGATIVE
NITRITE: NEGATIVE
Protein, ur: NEGATIVE mg/dL
Specific Gravity, Urine: 1.01 (ref 1.005–1.030)
Urobilinogen, UA: 0.2 mg/dL (ref 0.0–1.0)
pH: 5 (ref 5.0–8.0)

## 2017-04-15 LAB — CYTOLOGY - PAP
Adequacy: ABSENT
DIAGNOSIS: NEGATIVE

## 2017-04-16 ENCOUNTER — Encounter: Payer: Self-pay | Admitting: *Deleted

## 2017-04-16 ENCOUNTER — Encounter: Payer: Self-pay | Admitting: General Practice

## 2017-04-17 ENCOUNTER — Encounter: Payer: Self-pay | Admitting: Obstetrics and Gynecology

## 2017-04-22 ENCOUNTER — Ambulatory Visit: Payer: BC Managed Care – PPO | Admitting: Registered Nurse

## 2017-04-23 ENCOUNTER — Other Ambulatory Visit: Payer: Self-pay | Admitting: Medical

## 2017-04-23 ENCOUNTER — Encounter: Payer: Self-pay | Admitting: Obstetrics and Gynecology

## 2017-04-23 ENCOUNTER — Ambulatory Visit (HOSPITAL_COMMUNITY)
Admission: RE | Admit: 2017-04-23 | Discharge: 2017-04-23 | Disposition: A | Discharge status: Home / Self Care | Payer: BC Managed Care – PPO | Source: Ambulatory Visit | Attending: Obstetrics and Gynecology | Admitting: Obstetrics and Gynecology

## 2017-04-23 DIAGNOSIS — N950 Postmenopausal bleeding: Secondary | ICD-10-CM

## 2017-04-23 DIAGNOSIS — Z1231 Encounter for screening mammogram for malignant neoplasm of breast: Secondary | ICD-10-CM

## 2017-04-23 DIAGNOSIS — D259 Leiomyoma of uterus, unspecified: Secondary | ICD-10-CM | POA: Insufficient documentation

## 2017-04-24 ENCOUNTER — Ambulatory Visit (INDEPENDENT_AMBULATORY_CARE_PROVIDER_SITE_OTHER): Payer: BC Managed Care – PPO | Admitting: *Deleted

## 2017-04-24 DIAGNOSIS — R55 Syncope and collapse: Secondary | ICD-10-CM

## 2017-04-25 ENCOUNTER — Encounter: Payer: Self-pay | Admitting: Obstetrics and Gynecology

## 2017-04-25 NOTE — Progress Notes (Signed)
[facility] Summary Report / Loop Recorder 

## 2017-04-29 ENCOUNTER — Telehealth: Payer: Self-pay | Admitting: General Practice

## 2017-04-29 NOTE — Telephone Encounter (Signed)
Patient called and left message on nurse line requesting ultrasound results. Called patient and informed her of results. Patient verbalized understanding & asked where we go from here. Told patient Dr Gerarda Fraction wanted her to have a follow up appt with [place] which hasn't been scheduled yet. Told patient I would send a message to our front office and they will contact her with that appt. Patient verbalized understanding & had no questions

## 2017-05-01 ENCOUNTER — Ambulatory Visit: Payer: BC Managed Care – PPO | Admitting: Medical

## 2017-05-01 ENCOUNTER — Encounter: Payer: Self-pay | Admitting: Medical

## 2017-05-01 VITALS — BP 136/80 | HR 100 | Temp 102.3°F | Wt 240.8 lb

## 2017-05-01 DIAGNOSIS — R1011 Right upper quadrant pain: Secondary | ICD-10-CM | POA: Diagnosis not present

## 2017-05-01 DIAGNOSIS — R6889 Other general symptoms and signs: Secondary | ICD-10-CM | POA: Diagnosis not present

## 2017-05-01 DIAGNOSIS — J111 Influenza due to unidentified influenza virus with other respiratory manifestations: Secondary | ICD-10-CM | POA: Diagnosis not present

## 2017-05-01 LAB — POC INFLUENZA A&B (BINAX/QUICKVUE)
Influenza A, POC: NEGATIVE
Influenza B, POC: NEGATIVE

## 2017-05-01 MED ORDER — OSELTAMIVIR PHOSPHATE 75 MG PO CAPS: 75.0000 mg | ORAL_CAPSULE | Freq: Two times a day (BID) | ORAL | 0 refills | Status: DC

## 2017-05-01 NOTE — Progress Notes (Signed)
Subjective: Chief Complaint  Patient presents with  . possible flu    bodyaches,chills,coughing,fever   Here for acute illness, started last night with cough, body aches, chills, fever, bad headaches.  No sore throat.  Had 1 episode of vomiting last night, +nausea.  No diarrhea.  Has deep cough.   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   No other aggravating or relieving factors. No other complaint.  Past Medical History:  Diagnosis Date  . Abscess    increased drainage from abscess on buttock  . Anal fistula   . Anxiety   . Bilateral hip pain 05/27/2015  . Chronic pain syndrome 05/27/2015  . Depression    sees [name]  . Diabetes mellitus without complication ([place])   . Hypertension   . Sleep apnea    2008- sleep study, neg. for sleep apnea   . SVT (supraventricular tachycardia) ([place])    Current Outpatient Medications on File Prior to Visit  Medication Sig Dispense Refill  . allopurinol (ZYLOPRIM) 100 MG tablet Take 1 tablet (100 mg total) by mouth daily. 30 tablet 2  . atorvastatin (LIPITOR) 20 MG tablet TAKE 1 TABLET BY MOUTH AT BEDTIME 90 tablet 1  . baclofen (LIORESAL) 10 MG tablet Take 1 tablet (10 mg total) by mouth every 8 (eight) hours as needed for muscle spasms. 60 tablet 3  . busPIRone (BUSPAR) 10 MG tablet Take 1 tablet (10 mg total) by mouth 2 (two) times daily. 180 tablet 1  . cloNIDine (CATAPRES) 0.1 MG tablet TAKE 1 TABLET BY MOUTH AT BEDTIME 30 tablet 2  . diclofenac (VOLTAREN) 50 MG EC tablet Take 1 tablet (50 mg total) by mouth 2 (two) times daily with a meal. 60 tablet 3  . fluticasone (FLONASE) 50 MCG/ACT nasal spray Place 2 sprays into both nostrils daily. 16 g 2  . furosemide (LASIX) 40 MG tablet Take 1.5 tablets (60 mg total) by mouth daily. 135 tablet 0  . HYDROcodone-acetaminophen (NORCO) 10-325 MG tablet Take 1 tablet by mouth every 6 (six) hours as needed. 120 tablet 0  . Insulin  Glargine-Lixisenatide (SOLIQUA) 100-33 UNT-MCG/ML SOPN Inject 50 Units into the skin at bedtime. 6 mL 3  . metFORMIN (GLUCOPHAGE) 850 MG tablet Take 1 tablet (850 mg total) by mouth 2 (two) times daily with a meal. 180 tablet 3  . pantoprazole (PROTONIX) 40 MG tablet TAKE 1 TABLET BY MOUTH TWICE DAILY BEFORE A MEAL TAKE TABLET 30-60 MINUTES BEFORE BREAKFAST AND DINNER 60 tablet 3  . DULoxetine (CYMBALTA) 60 MG capsule TAKE 1 CAPSULE BY MOUTH TWICE DAILY (Patient not taking: Reported on 05/01/2017) 60 capsule 2   No current facility-administered medications on file prior to visit.     Objective: BP 136/80   Pulse 100   Temp (!) 102.3 F (39.1 C)   Wt 240 lb 12.8 oz (109.2 kg)   LMP 02/09/2017   SpO2 99%   BMI 42.66 kg/m   General: Ill-appearing, well-developed, well-nourished Skin: warm, dry HEENT: Nose inflamed and congested, clear conjunctiva, TMs pearly, no sinus tenderness, pharynx with erythema, no exudates Neck: Supple, non tender, shotty cervical adenopathy Heart: Regular rate and rhythm, normal S1, S2, no murmurs Lungs: Clear to auscultation bilaterally, no wheezes, rales, rhonchi Abdomen: mild RUQ tenderness, otherwise Non tender non distended Extremities: Mild generalized tenderness   Assessment: Encounter Diagnoses  Name Primary?  . Flu-like symptoms Yes  . Influenza   .  RUQ abdominal pain      Plan: Prescription given for Tamiflu, discussed risks/benefits of medication.    Discussed diagnosis of suspected influenza.  Negative flu test but discussed that negative flu test doesn't guarantee negative flu.  Clinically she has the flu.  Discussed supportive care including rest, hydration, OTC Tylenol or NSAID for fever, aches, and malaise.  Discussed period of contagion, self quarantine at home away from others to avoid spread of disease, discussed means of transmission, and possible complications including pneumonia.  If worse or not improving within the next 4-5 days,  then call or return.  Patient voiced understanding of diagnosis, recommendations, and treatment plan.  After visit summary given.  Gave note for work.  F/u with general surgery about possible gall bladder surgery given ongoing 1+ year hx/o RUQ pain, gall stones.   Chyane was seen today for possible flu.  Diagnoses and all orders for this visit:  Flu-like symptoms -     POC Influenza A&B(BINAX/QUICKVUE)  Influenza  RUQ abdominal pain  Other orders -     oseltamivir (TAMIFLU) 75 MG capsule; Take 1 capsule (75 mg total) by mouth 2 (two) times daily.

## 2017-05-02 ENCOUNTER — Other Ambulatory Visit: Payer: Self-pay | Admitting: Medical

## 2017-05-03 ENCOUNTER — Ambulatory Visit (HOSPITAL_BASED_OUTPATIENT_CLINIC_OR_DEPARTMENT_OTHER)
Admission: RE | Admit: 2017-05-03 | Discharge: 2017-05-03 | Disposition: A | Discharge status: Home / Self Care | Payer: BC Managed Care – PPO | Source: Ambulatory Visit | Attending: Physical Medicine & Rehabilitation | Admitting: Physical Medicine & Rehabilitation

## 2017-05-03 DIAGNOSIS — M545 Low back pain, unspecified: Secondary | ICD-10-CM

## 2017-05-03 DIAGNOSIS — M4805 Spinal stenosis, thoracolumbar region: Secondary | ICD-10-CM | POA: Insufficient documentation

## 2017-05-03 DIAGNOSIS — M160 Bilateral primary osteoarthritis of hip: Secondary | ICD-10-CM | POA: Insufficient documentation

## 2017-05-03 NOTE — Telephone Encounter (Signed)
Called pt she said that she has yeast  Infection and wants refill on this is this okay ?

## 2017-05-04 ENCOUNTER — Other Ambulatory Visit: Payer: Self-pay | Admitting: Medical

## 2017-05-06 NOTE — Telephone Encounter (Signed)
Is this okay to refill? 

## 2017-05-10 ENCOUNTER — Ambulatory Visit
Admission: RE | Admit: 2017-05-10 | Discharge: 2017-05-10 | Disposition: A | Discharge status: Home / Self Care | Payer: BC Managed Care – PPO | Source: Ambulatory Visit | Attending: Medical | Admitting: Medical

## 2017-05-10 ENCOUNTER — Ambulatory Visit: Payer: BC Managed Care – PPO | Admitting: Cardiovascular Disease

## 2017-05-10 DIAGNOSIS — Z1231 Encounter for screening mammogram for malignant neoplasm of breast: Secondary | ICD-10-CM

## 2017-05-13 ENCOUNTER — Encounter: Payer: BC Managed Care – PPO | Attending: Physical Medicine & Rehabilitation | Admitting: Registered Nurse

## 2017-05-13 ENCOUNTER — Encounter: Payer: Self-pay | Admitting: Registered Nurse

## 2017-05-13 VITALS — BP 135/82 | HR 95

## 2017-05-13 DIAGNOSIS — E119 Type 2 diabetes mellitus without complications: Secondary | ICD-10-CM | POA: Insufficient documentation

## 2017-05-13 DIAGNOSIS — M7061 Trochanteric bursitis, right hip: Secondary | ICD-10-CM

## 2017-05-13 DIAGNOSIS — I471 Supraventricular tachycardia: Secondary | ICD-10-CM | POA: Insufficient documentation

## 2017-05-13 DIAGNOSIS — Z8249 Family history of ischemic heart disease and other diseases of the circulatory system: Secondary | ICD-10-CM | POA: Diagnosis not present

## 2017-05-13 DIAGNOSIS — Z809 Family history of malignant neoplasm, unspecified: Secondary | ICD-10-CM | POA: Diagnosis not present

## 2017-05-13 DIAGNOSIS — M25552 Pain in left hip: Secondary | ICD-10-CM | POA: Diagnosis not present

## 2017-05-13 DIAGNOSIS — Z833 Family history of diabetes mellitus: Secondary | ICD-10-CM | POA: Diagnosis not present

## 2017-05-13 DIAGNOSIS — M5416 Radiculopathy, lumbar region: Secondary | ICD-10-CM | POA: Diagnosis not present

## 2017-05-13 DIAGNOSIS — M7062 Trochanteric bursitis, left hip: Secondary | ICD-10-CM | POA: Diagnosis not present

## 2017-05-13 DIAGNOSIS — Z79899 Other long term (current) drug therapy: Secondary | ICD-10-CM

## 2017-05-13 DIAGNOSIS — G4730 Sleep apnea, unspecified: Secondary | ICD-10-CM | POA: Insufficient documentation

## 2017-05-13 DIAGNOSIS — M25562 Pain in left knee: Secondary | ICD-10-CM | POA: Diagnosis not present

## 2017-05-13 DIAGNOSIS — G8929 Other chronic pain: Secondary | ICD-10-CM | POA: Diagnosis present

## 2017-05-13 DIAGNOSIS — M25551 Pain in right hip: Secondary | ICD-10-CM | POA: Insufficient documentation

## 2017-05-13 DIAGNOSIS — Z5181 Encounter for therapeutic drug level monitoring: Secondary | ICD-10-CM | POA: Diagnosis not present

## 2017-05-13 DIAGNOSIS — G894 Chronic pain syndrome: Secondary | ICD-10-CM

## 2017-05-13 DIAGNOSIS — F418 Other specified anxiety disorders: Secondary | ICD-10-CM | POA: Insufficient documentation

## 2017-05-13 DIAGNOSIS — M160 Bilateral primary osteoarthritis of hip: Secondary | ICD-10-CM

## 2017-05-13 DIAGNOSIS — I10 Essential (primary) hypertension: Secondary | ICD-10-CM | POA: Insufficient documentation

## 2017-05-13 MED ORDER — NORTRIPTYLINE HCL 10 MG PO CAPS: 10.0000 mg | ORAL_CAPSULE | Freq: Every day | ORAL | 2 refills | Status: DC

## 2017-05-13 MED ORDER — HYDROCODONE-ACETAMINOPHEN 10-325 MG PO TABS: 1.0000 | ORAL_TABLET | Freq: Four times a day (QID) | ORAL | 0 refills | Status: DC | PRN

## 2017-05-13 NOTE — Progress Notes (Signed)
Subjective:    Patient ID: [name], female    DOB: 1960/09/12, 57 y.o.   MRN: [id number]  HPI: [name] is a 57 year old female who returns for follow up appointment and medication refill. She states her pain is located in her lower back radiating into her left lower extremity, bilateral hips and left knee pain. Also reports her left knee pain has increased in intensity over the last month, denies falling. We will order an X-ray, she verbalizes understanding. She  rates her pain 6. Her current exercise regime is walking.  [name] Morphine equivalent is  45.33 MME.    Oral Swab was performed on 10/01/2016 it was consistent. UDS performed today.  Pain Inventory Average Pain 8 Pain Right Now 6 My pain is sharp, dull and aching  In the last 24 hours, has pain interfered with the following? General activity 10 Relation with others 10 Enjoyment of life 10 What TIME of day is your pain at its worst? daytime, evening Sleep (in general) Poor  Pain is worse with: walking, sitting, standing and some activites Pain improves with: rest, heat/ice and medication Relief from Meds: 2  Mobility use a cane how many minutes can you walk? 10 ability to climb steps?  yes do you drive?  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  yes  Neuro/Psych depression  Prior Studies Any changes since last visit?  no  Physicians involved in your care Any changes since last visit?  no   Family History  Problem Relation Age of Onset  . Hypertension Mother   . Alzheimer's disease Mother   . Diabetes Father   . Breast cancer Sister 16  . Anesthesia problems Neg Hx   . Hypotension Neg Hx   . Malignant hyperthermia Neg Hx   . Pseudochol deficiency Neg Hx   . Colon cancer Neg Hx   . Esophageal cancer Neg Hx     . Stomach cancer Neg Hx   . Rectal cancer Neg Hx    Social History   Socioeconomic History  . Marital status: Divorced    Spouse name: Not on file  . Number of children: 0  . Years of education: Not on file  . Highest education level: Not on file  Social Needs  . Financial resource strain: Not on file  . Food insecurity - worry: Not on file  . Food insecurity - inability: Not on file  . Transportation needs - medical: Not on file  . Transportation needs - non-medical: Not on file  Occupational History  . Occupation: TEACHER ASSISTANT  Tobacco Use  . Smoking status: Never Smoker  . Smokeless tobacco: Never Used  Substance and Sexual Activity  . Alcohol use: No  . Drug use: No  . Sexual activity: Yes  Other Topics Concern  . Not on file  Social History Narrative  . Not on file   Past Surgical History:  Procedure Laterality Date  . ANAL EXAMINATION UNDER ANESTHESIA  02/21/11   anal fistula  . BREAST SURGERY  patient does not remember date of procedure   pull fluid off lft br  . ELECTROPHYSIOLOGIC STUDY N/A 05/05/2015   Procedure: SVT Ablation;  Surgeon: Will Meredith Leeds, MD;  Location: [facility];  Service: Cardiovascular;  Laterality: N/A;  . EP IMPLANTABLE  DEVICE N/A 01/30/2016   Procedure: Loop Recorder Insertion;  Surgeon: Sanda Klein, MD;  Location: [facility];  Service: Cardiovascular;  Laterality: N/A;  . INCISE AND DRAIN ABCESS     abscess on right thigh and buttock  . KNEE ARTHROSCOPY     left  . SHOULDER SURGERY  04/14/09   right   Past Medical History:  Diagnosis Date  . Abscess    increased drainage from abscess on buttock  . Anal fistula   . Anxiety   . Bilateral hip pain 05/27/2015  . Chronic pain syndrome 05/27/2015  . Depression    sees [name]  . Diabetes mellitus without complication ([place])   . Hypertension   . Sleep apnea    2008- sleep study, neg. for sleep apnea   . SVT (supraventricular tachycardia) (HCC)    LMP  02/09/2017   Opioid Risk Score:  1 Fall Risk Score:  `1  Depression screen PHQ 2/9  Depression screen [facility] 2/9 04/12/2017 03/21/2017 12/24/2016 11/28/2016 02/22/2016 11/28/2015 10/26/2015  Decreased Interest 3 0 1 0 0 0 3  Down, Depressed, Hopeless 2 0 1 0 0 0 3  PHQ - 2 Score 5 0 2 0 0 0 6  Altered sleeping 3 - - - - - 3  Tired, decreased energy 3 - - - - - 3  Change in appetite 2 - - - - - 3  Feeling bad or failure about yourself  1 - - - - - 3  Trouble concentrating 1 - - - - - 3  Moving slowly or fidgety/restless 0 - - - - - 0  Suicidal thoughts 0 - - - - - 0  PHQ-9 Score 15 - - - - - 21  Difficult doing work/chores - - - - - - Very difficult  Some recent data might be hidden     Review of Systems  Constitutional: Negative.   HENT: Negative.   Eyes: Negative.   Respiratory: Negative.   Cardiovascular: Negative.   Gastrointestinal: Negative.   Endocrine: Negative.   Genitourinary: Negative.   Musculoskeletal: Positive for arthralgias, back pain, gait problem and myalgias.  Skin: Negative.   Allergic/Immunologic: Negative.   Hematological: Negative.   Psychiatric/Behavioral: Positive for dysphoric mood.  All other systems reviewed and are negative.      Objective:   Physical Exam  Constitutional: She appears well-developed and well-nourished.  HENT:  Head: Normocephalic and atraumatic.  Neck: Normal range of motion. Neck supple.  Cardiovascular: Normal rate and regular rhythm.  Pulmonary/Chest: Effort normal and breath sounds normal.  Musculoskeletal:  Normal Muscle Bulk and Muscle Testing Reveals: Upper Extremities: Full ROM and Muscle Strength 5/5 Lumbar Hypersensitivity Bilateral Greater Trochanter Tenderness: L>R Lower Extremities:Right: Decreased ROM and Muscle Strength 4/5 Right Lower Extremity Flexion Produces Pain into Lumbar Left: Full  ROM and Muscle Strength 5/5 Left Lower Extremity Flexion Produces Pain into Left Patella and Lumbar Arises from table  with ease Narrow Based Gait  Nursing note and vitals reviewed.         Assessment & Plan:  1.Chronic BilateralHip pain L>R---endstage OA of hips..Continue HEP as tolerated and Heat and Ice Therapy Refilled:Hydrocodone 10/325 one q6 hours as needed for moderate pain #120, 05/13/2017. Second script e-scribed for the following month. We will continue the opioid monitoring program, this consists of regular clinic visits, examinations, urine drug screen, pill counts as well as use of [place] Controlled Substance Reporting System. 2. Lumbar Radiculitis: RX: Pamelor. Continue Cymbalta  and Continue  HEP as Tolerated.  Continue to Monitor. 05/13/2017.- 3. Morbid obesity: Continue with healthy diet Regime and HEP. Encouraged to continue with healthy diet regime and exercise. 05/13/2017. 4. Bilateral Greater Trochanteric Bursitis: Continue with Heat and Ice Therapy. Continue to Monitor. 05/13/2017. 5. Left Knee Pain: RX: X-Ray. 05/13/2017  20 minutes of face to face patient care time was spent during this visit. All questions were encouraged and answered.  F/U in 1 month

## 2017-05-17 LAB — CUP PACEART REMOTE DEVICE CHECK
MDC IDC PG IMPLANT DT: 20171120
MDC IDC SESS DTM: 20190214084116

## 2017-05-17 NOTE — Telephone Encounter (Signed)
Lumbar xr reviewed, fairly unremarkable.

## 2017-05-18 LAB — TOXASSURE SELECT,+ANTIDEPR,UR

## 2017-05-21 ENCOUNTER — Telehealth: Payer: Self-pay | Admitting: *Deleted

## 2017-05-21 NOTE — Telephone Encounter (Signed)
Urine drug screen for this encounter is consistent for prescribed medication 

## 2017-05-22 ENCOUNTER — Encounter: Payer: Self-pay | Admitting: Obstetrics and Gynecology

## 2017-05-22 ENCOUNTER — Ambulatory Visit (HOSPITAL_COMMUNITY)
Admission: RE | Admit: 2017-05-22 | Discharge: 2017-05-22 | Disposition: A | Discharge status: Home / Self Care | Payer: BC Managed Care – PPO | Source: Ambulatory Visit | Attending: Registered Nurse | Admitting: Registered Nurse

## 2017-05-22 ENCOUNTER — Ambulatory Visit (INDEPENDENT_AMBULATORY_CARE_PROVIDER_SITE_OTHER): Payer: BC Managed Care – PPO | Admitting: Obstetrics and Gynecology

## 2017-05-22 VITALS — BP 140/77 | HR 93 | Ht 63.0 in | Wt 237.1 lb

## 2017-05-22 DIAGNOSIS — M1712 Unilateral primary osteoarthritis, left knee: Secondary | ICD-10-CM | POA: Insufficient documentation

## 2017-05-22 DIAGNOSIS — B373 Candidiasis of vulva and vagina: Secondary | ICD-10-CM | POA: Diagnosis not present

## 2017-05-22 DIAGNOSIS — B3731 Acute candidiasis of vulva and vagina: Secondary | ICD-10-CM

## 2017-05-22 DIAGNOSIS — M25562 Pain in left knee: Secondary | ICD-10-CM | POA: Insufficient documentation

## 2017-05-22 DIAGNOSIS — N950 Postmenopausal bleeding: Secondary | ICD-10-CM

## 2017-05-22 MED ORDER — MEGESTROL ACETATE 20 MG PO TABS: 20.0000 mg | ORAL_TABLET | Freq: Two times a day (BID) | ORAL | 0 refills | Status: DC

## 2017-05-22 MED ORDER — FLUCONAZOLE 150 MG PO TABS: ORAL_TABLET | ORAL | 0 refills | Status: DC

## 2017-05-22 NOTE — Progress Notes (Signed)
Pt has Yeast Infection, states meds are not working,wants to know if you can prescribe something stronger.

## 2017-05-23 ENCOUNTER — Ambulatory Visit (INDEPENDENT_AMBULATORY_CARE_PROVIDER_SITE_OTHER): Payer: BC Managed Care – PPO | Admitting: Cardiology

## 2017-05-23 ENCOUNTER — Encounter: Payer: Self-pay | Admitting: Obstetrics and Gynecology

## 2017-05-23 ENCOUNTER — Encounter: Payer: Self-pay | Admitting: Cardiology

## 2017-05-23 VITALS — BP 116/70 | HR 93 | Ht 63.0 in | Wt 238.0 lb

## 2017-05-23 DIAGNOSIS — R7989 Other specified abnormal findings of blood chemistry: Secondary | ICD-10-CM | POA: Diagnosis not present

## 2017-05-23 DIAGNOSIS — I471 Supraventricular tachycardia: Secondary | ICD-10-CM

## 2017-05-23 DIAGNOSIS — I498 Other specified cardiac arrhythmias: Secondary | ICD-10-CM | POA: Diagnosis not present

## 2017-05-23 DIAGNOSIS — R0609 Other forms of dyspnea: Secondary | ICD-10-CM | POA: Diagnosis not present

## 2017-05-23 DIAGNOSIS — R0602 Shortness of breath: Secondary | ICD-10-CM

## 2017-05-23 DIAGNOSIS — R0683 Snoring: Secondary | ICD-10-CM

## 2017-05-23 DIAGNOSIS — R0600 Dyspnea, unspecified: Secondary | ICD-10-CM

## 2017-05-23 LAB — ESTRADIOL

## 2017-05-23 LAB — PROLACTIN: PROLACTIN: 11 ng/mL (ref 4.8–23.3)

## 2017-05-23 LAB — FOLLICLE STIMULATING HORMONE: FSH: 63.9 m[IU]/mL

## 2017-05-23 NOTE — Assessment & Plan Note (Signed)
Followed by PCP, low TSH free T4 WNL

## 2017-05-23 NOTE — Progress Notes (Signed)
Ever since RFA, all palpitations have been associated with normal rhythm. MCr

## 2017-05-23 NOTE — Assessment & Plan Note (Addendum)
BMI 42. Suspected sleep apnea with a history of snoring, obesity, thick neck, past arrhythmia, daytime fatigue

## 2017-05-23 NOTE — Patient Instructions (Signed)
Medication Instructions:  SAMPLES of Bystolic 5mg  Take 1 tablet once a day   Labwork: None   Testing/Procedures: Your physician has requested that you have an echocardiogram. Echocardiography is a painless test that uses sound waves to create images of your heart. It provides your doctor with information about the size and shape of your heart and how well your heart's chambers and valves are working. This procedure takes approximately one hour. There are no restrictions for this procedure. TEST WILL BE PERFORMED AT [place] has recommended that you have a sleep study. This test records several body functions during sleep, including: brain activity, eye movement, oxygen and carbon dioxide blood levels, heart rate and rhythm, breathing rate and rhythm, the flow of air through your mouth and nose, snoring, body muscle movements, and chest and belly movement. THIS TEST MUST BE APPROVED BY YOUR INSURANCE BEFORE SCHEDULING  Follow-Up: Your physician recommends that you schedule a follow-up appointment in: 4-6 [place]   Any Other Special Instructions Will Be Listed Below (If Applicable).     If you need a refill on your cardiac medications before your next appointment, please call your pharmacy.

## 2017-05-23 NOTE — Assessment & Plan Note (Signed)
RFA Feb 2017

## 2017-05-23 NOTE — Assessment & Plan Note (Signed)
Seen today for intermittent dyspnea and tachycardia Loop shows no tachycardia.  She had stopped beta blocker some time ago secondary to SOB

## 2017-05-23 NOTE — Progress Notes (Signed)
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 Her loop has never documented any arrhythmia. Her LOV was Nov 2017. She is in the office today with complaints of intermittent tachycardia associated with SOB. She says she can be at rest and her HR will go up to 90. She stopped Lopressor some time ago because she felt it was making her SOB. I reviewed her recent loop recorder tracing and she has been in NSR. O2 sat is 94%, HR at rest 90.    Current Outpatient Medications  Medication Sig Dispense Refill  . allopurinol (ZYLOPRIM) 100 MG tablet Take 1 tablet (100 mg total) by mouth daily. 30 tablet 2  . cloNIDine (CATAPRES) 0.1 MG tablet TAKE 1 TABLET BY MOUTH AT BEDTIME 30 tablet 2  . diclofenac (VOLTAREN) 50 MG EC tablet Take 1 tablet (50 mg total) by mouth 2 (two) times daily with a meal. 60 tablet 3  . DULoxetine (CYMBALTA) 60 MG capsule TAKE 1 CAPSULE BY MOUTH TWICE DAILY 60 capsule 2  . fluconazole (DIFLUCAN) 150 MG tablet TAKE 1 TABLET BY MOUTH EVERY OTHER DAY 3 tablet 0  . fluticasone (FLONASE) 50 MCG/ACT nasal spray Place 2 sprays into both nostrils daily. 16 g 2  . furosemide (LASIX) 40 MG tablet Take 1.5 tablets (60 mg total) by mouth daily. 135 tablet 0  . HYDROcodone-acetaminophen (NORCO) 10-325 MG tablet Take 1 tablet by mouth every 6 (six) hours as needed. 120 tablet 0  . Insulin Glargine-Lixisenatide (SOLIQUA) 100-33 UNT-MCG/ML SOPN Inject 50 Units into the skin at bedtime. 6 mL 3  . megestrol (MEGACE) 20 MG tablet Take 1 tablet (20 mg total) by mouth 2 (two) times daily. 60 tablet 0  . metFORMIN (GLUCOPHAGE) 850 MG tablet Take 1 tablet (850 mg total) by mouth 2 (two) times daily with a meal. 180 tablet 3  . nortriptyline (PAMELOR) 10 MG capsule Take 1 capsule  (10 mg total) by mouth at bedtime. 30 capsule 2   No current facility-administered medications for this visit.     Allergies  Allergen Reactions  . Tramadol Nausea Only    Past Medical History:  Diagnosis Date  . Abscess    increased drainage from abscess on buttock  . Anal fistula   . Anxiety   . Bilateral hip pain 05/27/2015  . Chronic pain syndrome 05/27/2015  . Depression    sees [name]  . Diabetes mellitus without complication ([place])   . Hypertension   . Sleep apnea    2008- sleep study, neg. for sleep apnea   . SVT (supraventricular tachycardia) (HCC)     Social History   Socioeconomic History  . Marital status: Divorced    Spouse name: Not on file  . Number of children: 0  . Years of education: Not on file  . Highest education level: Not on file  Social Needs  . Financial resource strain: Not on file  . Food insecurity - worry: Not on file  . Food insecurity - inability: Not on file  . Transportation needs - medical: Not on file  . Transportation needs - non-medical: Not on file  Occupational History  . Occupation: TEACHER ASSISTANT  Tobacco Use  . Smoking status: Never Smoker  . Smokeless tobacco: Never Used  Substance and  Sexual Activity  . Alcohol use: No  . Drug use: No  . Sexual activity: Yes  Other Topics Concern  . Not on file  Social History Narrative  . Not on file     Family History  Problem Relation Age of Onset  . Hypertension Mother   . Alzheimer's disease Mother   . Diabetes Father   . Breast cancer Sister 63  . Anesthesia problems Neg Hx   . Hypotension Neg Hx   . Malignant hyperthermia Neg Hx   . Pseudochol deficiency Neg Hx   . Colon cancer Neg Hx   . Esophageal cancer Neg Hx   . Stomach cancer Neg Hx   . Rectal cancer Neg Hx      Review of Systems: General: negative for chills, fever, night sweats or weight changes.  Snores, daytime fatigue Cardiovascular: negative for chest pain, dyspnea on exertion, edema,  orthopnea, palpitations, paroxysmal nocturnal dyspnea or shortness of breath Dermatological: negative for rash Respiratory: negative for cough or wheezing Urologic: negative for hematuria Abdominal: negative for nausea, vomiting, diarrhea, bright red blood per rectum, melena, or hematemesis Neurologic: negative for visual changes, syncope, or dizziness All other systems reviewed and are otherwise negative except as noted above.    Blood pressure 116/70, pulse 93, height 5\' 3"  (1.6 m), weight 238 lb (108 kg), last menstrual period 02/09/2017, SpO2 94 %.  General appearance: alert, cooperative, no distress and morbidly obese Neck: no carotid bruit and no JVD Lungs: clear to auscultation bilaterally Heart: regular rate and rhythm Abdomen: obese, soft Extremities: extremities normal, atraumatic, no cyanosis or edema Pulses: 2+ and symmetric Skin: Skin color, texture, turgor normal. No rashes or lesions Neurologic: Grossly normal  EKG NSR  ASSESSMENT AND PLAN:   DOE (dyspnea on exertion) Seen today for intermittent dyspnea and tachycardia Loop shows no tachycardia.  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 Suspected sleep apnea with a history of snoring, obesity, thick neck, past arrhythmia, daytime fatigue   PLAN  I suggested we try a different beta blocker, will giver her samples of Bystolic. If she does well with this we'll give her an Rx for it, I do think she needs chronic beta blocker. I suggested we get an echo to document her LVF and a sleep study. F/U with Dr Sallyanne Kuster after this.   Kerin Ransom PA-C 05/23/2017 4:51 PM

## 2017-05-24 ENCOUNTER — Telehealth: Payer: Self-pay | Admitting: *Deleted

## 2017-05-24 NOTE — Telephone Encounter (Signed)
Patient scheduled for sleep study 06/09/17 @ [facility]. Contact information left if she needs to reschedule.

## 2017-05-25 NOTE — Progress Notes (Signed)
GYNECOLOGY OFFICE VISIT NOTE  History:  57 y.o. G1P0100 here today for follow-up. Patient is s/p work-up for postpartum bleeding. States she has had two more episodes of bleeding since last visit. One lasting 2 days and another 3 days. Had to wear pads. Wants to know her options from here.   Also seeking treatment for yeast infection. States she gets them frequently and OTC medication does not help. Wants to know if we can send something in. Has h/o uncontrolled DM. She denies any other concerns.   Past Medical History:  Diagnosis Date  . Abscess    increased drainage from abscess on buttock  . Anal fistula   . Anxiety   . Bilateral hip pain 05/27/2015  . Chronic pain syndrome 05/27/2015  . Depression    sees [name]  . Diabetes mellitus without complication ([place])   . Hypertension   . Sleep apnea    2008- sleep study, neg. for sleep apnea   . SVT (supraventricular tachycardia) (HCC)     Past Surgical History:  Procedure Laterality Date  . ANAL EXAMINATION UNDER ANESTHESIA  02/21/11   anal fistula  . BREAST SURGERY  patient does not remember date of procedure   pull fluid off lft br  . ELECTROPHYSIOLOGIC STUDY N/A 05/05/2015   Procedure: SVT Ablation;  Surgeon: Will Meredith Leeds, MD;  Location: [facility];  Service: Cardiovascular;  Laterality: N/A;  . EP IMPLANTABLE DEVICE N/A 01/30/2016   Procedure: Loop Recorder Insertion;  Surgeon: Sanda Klein, MD;  Location: [facility];  Service: Cardiovascular;  Laterality: N/A;  . INCISE AND DRAIN ABCESS     abscess on right thigh and buttock  . KNEE ARTHROSCOPY     left  . SHOULDER SURGERY  04/14/09   right    The following portions of the patient's history were reviewed and updated as appropriate: allergies, current medications, past family history, past medical history, past social history, past surgical history and problem list.    Review of Systems:  Pertinent items noted in HPI and remainder of  comprehensive ROS otherwise negative.   Objective:  Physical Exam BP 140/77   Pulse 93   Ht 5\' 3"  (1.6 m)   Wt 237 lb 1.6 oz (107.5 kg)   LMP 02/09/2017   BMI 42.00 kg/m  CONSTITUTIONAL: Well-developed, well-nourished female in no acute distress.  PSYCHIATRIC: Normal mood and affect. Normal behavior. Normal judgment and thought content. CARDIOVASCULAR: Normal heart rate noted RESPIRATORY: Effort and breath sounds normal, no problems with respiration noted ABDOMEN: Soft, no distention noted.   MUSCULOSKELETAL: Normal range of motion. No edema noted.  Labs and Imaging Dg Lumbar Spine Complete  Result Date: 05/03/2017 CLINICAL DATA:  Low back pain with radiculopathy x3 years. EXAM: LUMBAR SPINE - COMPLETE 4+ VIEW COMPARISON:  None. FINDINGS: Normal lumbar segmentation and lordosis. Slight disc space narrowing noted at T12-L1. No pars defects or listhesis. No acute fracture. No suspicious osseous lesions. IMPRESSION: Slight disc space narrowing T12-L1. No acute fracture, suspicious osseous lesions or listhesis. Electronically Signed   By: Ashley Royalty M.D.   On: 05/03/2017 15:20   Dg Knee 1-2 Views Left  Result Date: 05/22/2017 CLINICAL DATA:  Worsening left knee pain. EXAM: LEFT KNEE - 1-2 VIEW COMPARISON:  01/29/2016 FINDINGS: Moderate degenerative changes in all 3 compartments of the left knee, most pronounced in the patellofemoral compartment with joint space narrowing and spurring. No acute bony abnormality. Specifically, no fracture, subluxation, or dislocation. No joint  effusion. IMPRESSION: Moderate degenerative changes as above.  No acute bony abnormality. Electronically Signed   By: Rolm Baptise M.D.   On: 05/22/2017 16:43   Mm Screening Breast Tomo Bilateral  Result Date: 05/13/2017 CLINICAL DATA:  Screening. EXAM: DIGITAL SCREENING BILATERAL MAMMOGRAM WITH TOMO AND CAD COMPARISON:  Previous exam(s). ACR Breast Density Category a: The breast tissue is almost entirely fatty.  FINDINGS: There are no findings suspicious for malignancy. Images were processed with CAD. IMPRESSION: No mammographic evidence of malignancy. A result letter of this screening mammogram will be mailed directly to the patient. RECOMMENDATION: Screening mammogram in one year. (Code:SM-B-01Y) BI-RADS CATEGORY  1: Negative. Electronically Signed   By: Margarette Canada M.D.   On: 05/13/2017 12:45    Assessment & Plan:  1. Yeast vaginitis Rx for fluconazole given.   2. Postmenopausal bleeding Reviewed work-up with patient. US unremarkable other than a small posterior leiomyoma. Endometrium normal. Endometrial biopsy with benign-endocervical polyp. Discussed treatment with Megace vs hysteroscopy. Patient would like to try Megace. Will also get labs to make sure patient is menopausal.  - Follicle stimulating hormone - Estradiol - Prolactin - Rx Megace  Please refer to After Visit Summary for other counseling recommendations.   Luiz Blare, DO OB Fellow Center for [facility], [facility]

## 2017-05-27 ENCOUNTER — Ambulatory Visit (INDEPENDENT_AMBULATORY_CARE_PROVIDER_SITE_OTHER): Payer: BC Managed Care – PPO | Admitting: *Deleted

## 2017-05-27 DIAGNOSIS — R55 Syncope and collapse: Secondary | ICD-10-CM

## 2017-05-28 NOTE — Progress Notes (Signed)
[facility] Summary Report / Loop Recorder 

## 2017-05-29 ENCOUNTER — Ambulatory Visit: Payer: Self-pay | Admitting: Medical

## 2017-06-04 ENCOUNTER — Telehealth: Payer: Self-pay | Admitting: *Deleted

## 2017-06-04 ENCOUNTER — Telehealth: Payer: Self-pay | Admitting: Registered Nurse

## 2017-06-04 ENCOUNTER — Other Ambulatory Visit: Payer: Self-pay

## 2017-06-04 ENCOUNTER — Ambulatory Visit (HOSPITAL_COMMUNITY): Payer: BC Managed Care – PPO | Attending: Cardiology

## 2017-06-04 DIAGNOSIS — E119 Type 2 diabetes mellitus without complications: Secondary | ICD-10-CM | POA: Insufficient documentation

## 2017-06-04 DIAGNOSIS — Z6841 Body Mass Index (BMI) 40.0 and over, adult: Secondary | ICD-10-CM | POA: Diagnosis not present

## 2017-06-04 DIAGNOSIS — E669 Obesity, unspecified: Secondary | ICD-10-CM | POA: Insufficient documentation

## 2017-06-04 DIAGNOSIS — I471 Supraventricular tachycardia: Secondary | ICD-10-CM | POA: Insufficient documentation

## 2017-06-04 DIAGNOSIS — I498 Other specified cardiac arrhythmias: Secondary | ICD-10-CM

## 2017-06-04 DIAGNOSIS — I10 Essential (primary) hypertension: Secondary | ICD-10-CM | POA: Diagnosis not present

## 2017-06-04 DIAGNOSIS — R0602 Shortness of breath: Secondary | ICD-10-CM | POA: Diagnosis not present

## 2017-06-04 DIAGNOSIS — R0609 Other forms of dyspnea: Secondary | ICD-10-CM

## 2017-06-04 NOTE — Telephone Encounter (Signed)
Return Ms. Glazebrook call, reviewed X-ray results. She verbalizes understanding.  We discussed treatment modalities, she will be scheduled with Zilretta Injection with Dr Naaman Plummer, she verbalizes understanding.

## 2017-06-04 NOTE — Telephone Encounter (Signed)
Patient left a message asking for x-ray results. Ordered by Danella Sensing, ANP. Left knee 1-2 views

## 2017-06-04 NOTE — Telephone Encounter (Signed)
Return Ms. Monk call, left message to return the call,

## 2017-06-05 ENCOUNTER — Encounter: Payer: Self-pay | Admitting: Cardiology

## 2017-06-09 ENCOUNTER — Ambulatory Visit (HOSPITAL_BASED_OUTPATIENT_CLINIC_OR_DEPARTMENT_OTHER): Payer: BC Managed Care – PPO | Attending: Cardiology | Admitting: Cardiovascular Disease

## 2017-06-09 VITALS — Ht 63.0 in | Wt 237.0 lb

## 2017-06-09 DIAGNOSIS — G478 Other sleep disorders: Secondary | ICD-10-CM

## 2017-06-09 DIAGNOSIS — R0609 Other forms of dyspnea: Secondary | ICD-10-CM | POA: Diagnosis not present

## 2017-06-09 DIAGNOSIS — G4736 Sleep related hypoventilation in conditions classified elsewhere: Secondary | ICD-10-CM | POA: Diagnosis not present

## 2017-06-09 DIAGNOSIS — G4733 Obstructive sleep apnea (adult) (pediatric): Secondary | ICD-10-CM | POA: Insufficient documentation

## 2017-06-09 DIAGNOSIS — I471 Supraventricular tachycardia: Secondary | ICD-10-CM | POA: Diagnosis present

## 2017-06-09 DIAGNOSIS — R0683 Snoring: Secondary | ICD-10-CM | POA: Diagnosis not present

## 2017-06-09 DIAGNOSIS — G4719 Other hypersomnia: Secondary | ICD-10-CM

## 2017-06-09 DIAGNOSIS — R0602 Shortness of breath: Secondary | ICD-10-CM | POA: Diagnosis present

## 2017-06-09 DIAGNOSIS — I498 Other specified cardiac arrhythmias: Secondary | ICD-10-CM | POA: Diagnosis present

## 2017-06-12 ENCOUNTER — Other Ambulatory Visit: Payer: Self-pay | Admitting: Cardiology

## 2017-06-12 ENCOUNTER — Other Ambulatory Visit: Payer: Self-pay | Admitting: Medical

## 2017-06-12 DIAGNOSIS — H6503 Acute serous otitis media, bilateral: Secondary | ICD-10-CM

## 2017-06-12 NOTE — Telephone Encounter (Signed)
°*  STAT* If patient is at the pharmacy, call can be transferred to refill team.   1. Which medications need to be refilled? (please list name of each medication and dose if known) a new prescription for Bystolic  2. Which pharmacy/location (including street and city if local pharmacy) is medication to be sent to?[facility] [phone] 3. Do they need a 30 day or 90 day supply? 180 and refills

## 2017-06-13 ENCOUNTER — Telehealth: Payer: Self-pay | Admitting: Cardiology

## 2017-06-13 MED ORDER — NEBIVOLOL HCL 5 MG PO TABS: 5.0000 mg | ORAL_TABLET | Freq: Every day | ORAL | 3 refills | Status: DC

## 2017-06-13 NOTE — Telephone Encounter (Signed)
Per chart review, patient was seen by [facility] on 05/23/17. She was ordered bystolic 5mg  and was given samples but no script and this was not added to med list. Will send in Rx for this medication per patient's request.

## 2017-06-13 NOTE — Telephone Encounter (Signed)
Bystolic 5 mg Lot # [id number] Exp 7/21 #3 and discount card left at front for pick up. Left message to call back

## 2017-06-13 NOTE — Telephone Encounter (Signed)
Patient calling the office for samples of medication:   1.  What medication and dosage are you requesting samples for? Bystolic// 10 mg  2.  Are you currently out of this medication? No, patient has two left

## 2017-06-14 ENCOUNTER — Telehealth: Payer: Self-pay | Admitting: Cardiovascular Disease

## 2017-06-14 ENCOUNTER — Encounter: Payer: Self-pay | Admitting: Cardiology

## 2017-06-14 NOTE — Telephone Encounter (Signed)
Patient walked in to pick up bystolic samples. She asked if her sleep study results were available. Reviewed with Mariann Laster CMA and the results are pending MD review. Patient made aware that she will be notified once MD has reviewed the sleep study report.

## 2017-06-14 NOTE — Telephone Encounter (Signed)
LM that samples + copay card have been left at front desk for her to pick up. This was also communicated to patient via MyChart encounter

## 2017-06-20 ENCOUNTER — Other Ambulatory Visit: Payer: Self-pay | Admitting: Medical

## 2017-06-20 DIAGNOSIS — H6503 Acute serous otitis media, bilateral: Secondary | ICD-10-CM

## 2017-06-24 ENCOUNTER — Other Ambulatory Visit: Payer: Self-pay | Admitting: Medical

## 2017-06-24 ENCOUNTER — Other Ambulatory Visit: Payer: Self-pay

## 2017-06-24 ENCOUNTER — Encounter: Payer: Self-pay | Admitting: Registered Nurse

## 2017-06-24 MED ORDER — INSULIN GLARGINE-LIXISENATIDE 100-33 UNT-MCG/ML ~~LOC~~ SOPN: 50.0000 [IU] | PEN_INJECTOR | Freq: Every day | SUBCUTANEOUS | 1 refills | Status: DC

## 2017-06-24 NOTE — Telephone Encounter (Signed)
Okay to refill? LOV-04/2017 acute visit.

## 2017-06-24 NOTE — Telephone Encounter (Signed)
Willeen Niece was changed verbally per provider.

## 2017-06-25 ENCOUNTER — Ambulatory Visit (INDEPENDENT_AMBULATORY_CARE_PROVIDER_SITE_OTHER): Payer: BC Managed Care – PPO | Admitting: Cardiovascular Disease

## 2017-06-25 ENCOUNTER — Encounter: Payer: Self-pay | Admitting: Cardiovascular Disease

## 2017-06-25 VITALS — BP 131/79 | HR 74 | Ht 63.0 in | Wt 239.0 lb

## 2017-06-25 DIAGNOSIS — E1165 Type 2 diabetes mellitus with hyperglycemia: Secondary | ICD-10-CM

## 2017-06-25 DIAGNOSIS — Z95818 Presence of other cardiac implants and grafts: Secondary | ICD-10-CM | POA: Diagnosis not present

## 2017-06-25 DIAGNOSIS — Z87898 Personal history of other specified conditions: Secondary | ICD-10-CM | POA: Diagnosis not present

## 2017-06-25 DIAGNOSIS — M25471 Effusion, right ankle: Secondary | ICD-10-CM

## 2017-06-25 DIAGNOSIS — M25472 Effusion, left ankle: Secondary | ICD-10-CM

## 2017-06-25 DIAGNOSIS — R002 Palpitations: Secondary | ICD-10-CM

## 2017-06-25 DIAGNOSIS — E0590 Thyrotoxicosis, unspecified without thyrotoxic crisis or storm: Secondary | ICD-10-CM | POA: Diagnosis not present

## 2017-06-25 DIAGNOSIS — Z9889 Other specified postprocedural states: Secondary | ICD-10-CM

## 2017-06-25 NOTE — Progress Notes (Signed)
Patient ID: Alannie Amodio, female   DOB: 03-03-1961, 57 y.o.   MRN: [id number] .    Cardiology Office Note    Date:  06/25/2017   ID:  Adriana Quinby, DOB 02-Aug-1960, MRN [id number]  PCP:  Carlena Hurl, PA-C  Cardiologist:   Sanda Klein, MD   Chief Complaint  Patient presents with  . Palpitations    History of Present Illness:  Narcissa Melder is a 57 y.o. female with History of AV node reentry tachycardia and radiofrequency ablation in February 2017, ILR implantation in November 2017 for syncope.  She has not had any new episodes of syncope and her loop recorder has not demonstrated any meaningful arrhythmia.  Despite this she has been troubled by feeling of rapid palpitations.  She had been on metoprolol, but stopped it because she thought it was making her more short of breath.  She saw Kerin Ransom in the office on March 14 and he started treatment with Bystolic 5 mg once daily.  She feels much better since then.  She had an echocardiogram with normal results just a couple of weeks ago.  Significant comorbid conditions include morbid obesity, presumptive obstructive sleep apnea, treated hyperlipidemia and type 2 diabetes mellitus on insulin. She does not have known structural heart disease. She has a goiter and low TSH with normal FT4.  She underwent a repeat sleep study 2 weeks ago.  The formal report is not yet ready, but as far as I can tell from the preliminary data there was really very little in the way of apnea/hypopnea events.  Past Medical History:  Diagnosis Date  . Abscess    increased drainage from abscess on buttock  . Anal fistula   . Anxiety   . Bilateral hip pain 05/27/2015  . Chronic pain syndrome 05/27/2015  . Depression    sees [name]  . Diabetes mellitus without complication ([place])   . Hypertension   . Sleep apnea    2008- sleep study, neg. for sleep apnea   . SVT (supraventricular tachycardia) (HCC)     Past Surgical History:    Procedure Laterality Date  . ANAL EXAMINATION UNDER ANESTHESIA  02/21/11   anal fistula  . BREAST SURGERY  patient does not remember date of procedure   pull fluid off lft br  . ELECTROPHYSIOLOGIC STUDY N/A 05/05/2015   Procedure: SVT Ablation;  Surgeon: Will Meredith Leeds, MD;  Location: [facility];  Service: Cardiovascular;  Laterality: N/A;  . EP IMPLANTABLE DEVICE N/A 01/30/2016   Procedure: Loop Recorder Insertion;  Surgeon: Sanda Klein, MD;  Location: [facility];  Service: Cardiovascular;  Laterality: N/A;  . INCISE AND DRAIN ABCESS     abscess on right thigh and buttock  . KNEE ARTHROSCOPY     left  . SHOULDER SURGERY  04/14/09   right    Outpatient Medications Prior to Visit  Medication Sig Dispense Refill  . allopurinol (ZYLOPRIM) 100 MG tablet Take 1 tablet (100 mg total) by mouth daily. 30 tablet 2  . cetirizine (ZYRTEC) 10 MG tablet TAKE 1 TABLET BY MOUTH AT BEDTIME 30 tablet 2  . cloNIDine (CATAPRES) 0.1 MG tablet TAKE 1 TABLET BY MOUTH AT BEDTIME 30 tablet 2  . diclofenac (VOLTAREN) 50 MG EC tablet Take 1 tablet (50 mg total) by mouth 2 (two) times daily with a meal. 60 tablet 3  . DULoxetine (CYMBALTA) 60 MG capsule TAKE 1 CAPSULE BY MOUTH TWICE DAILY 60 capsule 2  .  fluticasone (FLONASE) 50 MCG/ACT nasal spray Place 2 sprays into both nostrils daily. 16 g 2  . furosemide (LASIX) 40 MG tablet Take 1.5 tablets (60 mg total) by mouth daily. 135 tablet 0  . HYDROcodone-acetaminophen (NORCO) 10-325 MG tablet Take 1 tablet by mouth every 6 (six) hours as needed. 120 tablet 0  . Insulin Glargine-Lixisenatide (SOLIQUA) 100-33 UNT-MCG/ML SOPN Inject 50 Units as directed at bedtime. 15 mL 1  . megestrol (MEGACE) 20 MG tablet Take 1 tablet (20 mg total) by mouth 2 (two) times daily. 60 tablet 0  . metFORMIN (GLUCOPHAGE) 850 MG tablet Take 1 tablet (850 mg total) by mouth 2 (two) times daily with a meal. 180 tablet 3  . nebivolol (BYSTOLIC) 5 MG tablet Take 1  tablet (5 mg total) by mouth daily. 90 tablet 3  . nortriptyline (PAMELOR) 10 MG capsule Take 1 capsule (10 mg total) by mouth at bedtime. 30 capsule 2  . fluconazole (DIFLUCAN) 150 MG tablet TAKE 1 TABLET BY MOUTH EVERY OTHER DAY (Patient not taking: Reported on 06/25/2017) 3 tablet 0   No facility-administered medications prior to visit.      Allergies:   Tramadol   Social History   Socioeconomic History  . Marital status: Divorced    Spouse name: Not on file  . Number of children: 0  . Years of education: Not on file  . Highest education level: Not on file  Occupational History  . Occupation: TEACHER ASSISTANT  Social Needs  . Financial resource strain: Not on file  . Food insecurity:    Worry: Not on file    Inability: Not on file  . Transportation needs:    Medical: Not on file    Non-medical: Not on file  Tobacco Use  . Smoking status: Never Smoker  . Smokeless tobacco: Never Used  Substance and Sexual Activity  . Alcohol use: No  . Drug use: No  . Sexual activity: Yes  Lifestyle  . Physical activity:    Days per week: Not on file    Minutes per session: Not on file  . Stress: Not on file  Relationships  . Social connections:    Talks on phone: Not on file    Gets together: Not on file    Attends religious service: Not on file    Active member of club or organization: Not on file    Attends meetings of clubs or organizations: Not on file    Relationship status: Not on file  Other Topics Concern  . Not on file  Social History Narrative  . Not on file     Family History:  The patient's family history includes Alzheimer's disease in her mother; Breast cancer (age of onset: 44) in her sister; Diabetes in her father; Hypertension in her mother.   ROS:   Please see the history of present illness.    ROS All other systems reviewed and are negative.   PHYSICAL EXAM:   VS:  BP 131/79   Pulse 74   Ht 5\' 3"  (1.6 m)   Wt 239 lb (108.4 kg)   LMP 02/09/2017    BMI 42.34 kg/m     General: Alert, oriented x3, no distress, morbidly obese Head: no evidence of trauma, PERRL, EOMI, no exophtalmos or lid lag, no myxedema, no xanthelasma; normal ears, nose and oropharynx Neck: normal jugular venous pulsations and no hepatojugular reflux; brisk carotid pulses without delay and no carotid bruits Chest: clear to auscultation, no signs of  consolidation by percussion or palpation, normal fremitus, symmetrical and full respiratory excursions Cardiovascular: normal position and quality of the apical impulse, regular rhythm, normal first and second heart sounds, no murmurs, rubs or gallops Abdomen: no tenderness or distention, no masses by palpation, no abnormal pulsatility or arterial bruits, normal bowel sounds, no hepatosplenomegaly Extremities: no clubbing, cyanosis; symmetrical trace ankle edema; 2+ radial, ulnar and brachial pulses bilaterally; 2+ right femoral, posterior tibial and dorsalis pedis pulses; 2+ left femoral, posterior tibial and dorsalis pedis pulses; no subclavian or femoral bruits Neurological: grossly nonfocal Psych: Normal mood and affect   Wt Readings from Last 3 Encounters:  06/25/17 239 lb (108.4 kg)  06/09/17 237 lb (107.5 kg)  05/23/17 238 lb (108 kg)      Studies/Labs Reviewed:   EKG:  EKG is not ordered today.   Recent Labs: 12/12/2016: ALT 12; BUN 14; Creat 1.15; Hemoglobin 12.6; Platelets 428; Potassium 3.7; Sodium 137; TSH 0.19   Lipid Panel    Component Value Date/Time   CHOL 175 12/07/2014 1534   TRIG 220.0 (H) 12/07/2014 1534   HDL 30.10 (L) 12/07/2014 1534   CHOLHDL 6 12/07/2014 1534   VLDL 44.0 (H) 12/07/2014 1534   LDLDIRECT 127.0 12/07/2014 1534    ASSESSMENT:    No diagnosis found.   PLAN:  In order of problems listed above:  1. Palpitations: No objective arrhythmia seen, but she feels much better on beta-blockers.  I wonder if these are actually symptoms of thyrotoxicosis.  Twice last year she had  markedly suppressed TSH, but with a normal free T4.  She has a visible goiter.  We will continue treatment with Bystolic.  Samples were given today.  2. Syncope: 1 of her episodes in the past was probably vasovagal, she has not had syncope since implantation of her loop recorder.  No meaningful arrhythmia has been detected. 3. ILR: Healthy recorder site.  Anticipate another 6 months or so of battery life 4. AV node reentry tachycardia, s/p RF ablation.  There has been no documentation of recurrent arrhythmia since the ablation. 3. Obesity: Strongly encouraged to pursue weight loss to help with her diabetes and to lessen the likelihood of more complex atrial arrhythmias in the future.  Still waiting for the results of her sleep study 4. DM: Poorly controlled.  I wonder if this is also a manifestation of hyperthyroidism 5. Edema: Should have a repeat potassium level checked since she is taking higher doses of furosemide intermittently.  She wants to have labs drawn when she sees her primary care provider Dorothea Ogle in the next few days. 6.  Hyperthyroidism: Not yet on therapy.  May benefit from endocrinology evaluation.  There are no Patient Instructions on file for this visit. Signed, Sanda Klein, MD  06/25/2017 8:47 AM    [facility] [place], [place], [place]  [address] Phone: [phone]; Fax: [phone]

## 2017-06-25 NOTE — Patient Instructions (Signed)
Dr Sallyanne Kuster recommends that you schedule a follow-up appointment in 12 months. You will receive a reminder letter in the mail two months in advance. If you don't receive a letter, please call our office to schedule the follow-up appointment.  If you need a refill on your cardiac medications before your next appointment, please call your pharmacy.  Your physician recommends that you return for lab work with your primary care doctor.

## 2017-06-26 ENCOUNTER — Encounter: Payer: Self-pay | Admitting: Family Medicine

## 2017-06-26 ENCOUNTER — Ambulatory Visit: Payer: BC Managed Care – PPO | Admitting: Family Medicine

## 2017-06-26 VITALS — BP 124/86 | HR 76 | Temp 98.0°F | Ht 62.0 in | Wt 242.0 lb

## 2017-06-26 DIAGNOSIS — E042 Nontoxic multinodular goiter: Secondary | ICD-10-CM

## 2017-06-26 NOTE — Addendum Note (Signed)
Addended by: Elyse Jarvis on: 06/26/2017 04:33 PM   Modules accepted: Orders

## 2017-06-26 NOTE — Progress Notes (Signed)
   Subjective:    Patient ID: [name], female    DOB: [birth year], 57 y.o.   MRN: [id number]  HPI She was seen recently by her cardiologist who noted an enlarged thyroid.  Review of the record indicates she has a previous history of multinodular goiter and did have a fine-needle biopsy which was apparently benign.  Since then she has had several TSH readings.  The most recent one was low.  She has had no skin or hair changes.  She has apparently gained weight.  She has noted however some difficulty with swallowing and the fact that she feels that the thyroid has grown in size.   Review of Systems     Objective:   Physical Exam Alert and in no distress.  Diffusely palpable thyroid is noted.  Reflexes are not normal.  Skin it feels normal.       Assessment & Plan:  Multinodular goiter - Plan: Thyroid Panel With TSH, US Soft Tissue Head/Neck, Ambulatory referral to Endocrinology Since the patient states she thinks that the goiter has grown and is having some difficulty with swallowing, I will set her up to get follow-up with endocrinology.

## 2017-06-27 ENCOUNTER — Telehealth: Payer: Self-pay

## 2017-06-27 LAB — BASIC METABOLIC PANEL
BUN/Creatinine Ratio: 17 (ref 9–23)
BUN: 17 mg/dL (ref 6–24)
CALCIUM: 9.7 mg/dL (ref 8.7–10.2)
CHLORIDE: 98 mmol/L (ref 96–106)
CO2: 21 mmol/L (ref 20–29)
CREATININE: 1.02 mg/dL — AB (ref 0.57–1.00)
GFR calc Af Amer: 71 mL/min/{1.73_m2} (ref >59)
GFR calc non Af Amer: 62 mL/min/{1.73_m2} (ref >59)
Glucose: 340 mg/dL — ABNORMAL HIGH (ref 65–99)
Potassium: 4.2 mmol/L (ref 3.5–5.2)
Sodium: 139 mmol/L (ref 134–144)

## 2017-06-27 LAB — THYROID PANEL WITH TSH
FREE THYROXINE INDEX: 1.3 (ref 1.2–4.9)
T3 Uptake Ratio: 26 % (ref 24–39)
T4, Total: 5 ug/dL (ref 4.5–12.0)
TSH: 0.51 u[IU]/mL (ref 0.450–4.500)

## 2017-06-27 NOTE — Telephone Encounter (Signed)
Called pt to let her know that her appt for the u/s is set for Tuesday 07-02-17 and she should arrive at [facility] by 12:15 to check in. Should she need to cancel she can call [phone]. [name] 06-27-17

## 2017-06-30 ENCOUNTER — Encounter (HOSPITAL_BASED_OUTPATIENT_CLINIC_OR_DEPARTMENT_OTHER): Payer: Self-pay | Admitting: Cardiovascular Disease

## 2017-06-30 NOTE — Procedures (Signed)
Patient Name: [name], [place] Date: 06/09/2017 Gender: Female D.O.B: 12/28/1960 Age (years): 56 Referring Provider: Kerin Ransom Height (inches): 63 Interpreting Physician: Shelva Majestic MD, ABSM Weight (lbs): 237 RPSGT: Zadie Rhine BMI: 42 MRN: [id number] Neck Size: 16.00  CLINICAL INFORMATION Sleep Study Type: NPSG  Indication for sleep study: Snoring  Epworth Sleepiness Score: 15  SLEEP STUDY TECHNIQUE As per the AASM Manual for the Scoring of Sleep and Associated Events v2.3 (April 2016) with a hypopnea requiring 4% desaturations.  The channels recorded and monitored were frontal, central and occipital EEG, electrooculogram (EOG), submentalis EMG (chin), nasal and oral airflow, thoracic and abdominal wall motion, anterior tibialis EMG, snore microphone, electrocardiogram, and pulse oximetry.  MEDICATIONS     allopurinol (ZYLOPRIM) 100 MG tablet         cetirizine (ZYRTEC) 10 MG tablet         cloNIDine (CATAPRES) 0.1 MG tablet         diclofenac (VOLTAREN) 50 MG EC tablet         DULoxetine (CYMBALTA) 60 MG capsule         fluconazole (DIFLUCAN) 150 MG tablet         fluticasone (FLONASE) 50 MCG/ACT nasal spray         furosemide (LASIX) 40 MG tablet         HYDROcodone-acetaminophen (NORCO) 10-325 MG tablet         Insulin Glargine-Lixisenatide (SOLIQUA) 100-33 UNT-MCG/ML SOPN         megestrol (MEGACE) 20 MG tablet         metFORMIN (GLUCOPHAGE) 850 MG tablet         nebivolol (BYSTOLIC) 5 MG tablet         nortriptyline (PAMELOR) 10 MG capsule      Medications self-administered by patient taken the night of the study : PAMELOR, METFORMIN, SOLIQUA, CYMBALTA, VOLTAREN, CATAPRES  SLEEP ARCHITECTURE The study was initiated at 10:10:47 PM and ended at 4:21:04 AM.  Sleep onset time was 53.7 minutes and the sleep efficiency was 68.5%%. The total sleep time was 253.6 minutes.  Stage REM latency was N/A minutes.  The patient spent 17.0%% of the night  in stage N1 sleep, 76.9%% in stage N2 sleep, 6.1%% in stage N3 and 0.00% in REM.  Alpha intrusion was absent.  Supine sleep was 13.15%.  RESPIRATORY PARAMETERS The overall apnea/hypopnea index (AHI) was 0.2 per hour. The respiratory disturbance index (RDI) was 19.4/h.  There were 0 total apneas, including 0 obstructive, 0 central and 0 mixed apneas. There were 1 hypopneas and 81 RERAs.  The AHI during Stage REM sleep was N/A per hour.  AHI while supine was 0.0 per hour.  The mean oxygen saturation was 95.1%. The minimum SpO2 during sleep was 91.0%.  Moderate snoring was noted during this study.  CARDIAC DATA The 2 lead EKG demonstrated sinus rhythm. The mean heart rate was 65.8 beats per minute. Other EKG findings include: None.  LEG MOVEMENT DATA The total PLMS were 0 with a resulting PLMS index of 0.0. Associated arousal with leg movement index was 0.0 .  IMPRESSIONS - No significant obstructive sleep apnea occurred during this study (AHI = 0.2/h). - No significant central sleep apnea occurred during this study (CAI = 0.0/h). - The patient had minimal or no oxygen desaturation during the study (Min O2 = 91.0%). - Reduced sleep efficiency with prolonged latency to sleep onset at 83.7 minutes. - Abnormal sleep architecture with absence of REM  sleep. Medications may be playing a role in the suppression of REM sleep. - The patient snored with moderate snoring volume. - No cardiac abnormalities were noted during this study. - Clinically significant periodic limb movements did not occur during sleep. No significant associated arousals.  DIAGNOSIS - Increased upper airway resistance syndrome - Nocturnal Hypoxemia (327.26 [G47.36 ICD-10])  RECOMMENDATIONS - At present, the patient does not meet criteria for CPAP therapy.  Although the AHI was minimal at 0.2 per hour, the RDI was 19.4/h.  REM sleep was not achieved on the present study.  The overall AHI may underestimate the severity  of the patient's underlying sleep-disordered breathing. - Effort should be made to optimize nasal and oral pharyngeal patency. - If patient continues to be symptomatic, particularly with excessive daytime sleepiness (ESS 15), consider a follow-up evaluation and possible multiple latency sleep testing (MLST) for evaluation of idiopathic hypersomnia/narcolepsy. - Avoid alcohol, sedatives and other CNS depressants that may worsen sleep apnea and disrupt normal sleep architecture. - Sleep hygiene should be reviewed to assess factors that may improve sleep quality. - Weight management and regular exercise should be initiated or continued if appropriate.  [Electronically signed] 06/30/2017 05:33 PM  Shelva Majestic MD,facc, ABSM Diplomate, American Board of Sleep Medicine   NPI: [id number] [place] PH: [phone]   FX: [phone] [place]

## 2017-07-01 ENCOUNTER — Telehealth: Payer: Self-pay | Admitting: *Deleted

## 2017-07-01 ENCOUNTER — Ambulatory Visit (INDEPENDENT_AMBULATORY_CARE_PROVIDER_SITE_OTHER): Payer: BC Managed Care – PPO | Admitting: *Deleted

## 2017-07-01 DIAGNOSIS — R55 Syncope and collapse: Secondary | ICD-10-CM

## 2017-07-01 NOTE — Progress Notes (Signed)
Left sleep study results and recommendations message on patient's voicemail. Call back if questions and/or concerns.

## 2017-07-01 NOTE — Telephone Encounter (Signed)
-----   Message from Troy Sine, MD sent at 06/30/2017  5:41 PM EDT ----- Mariann Laster, please notify the patient of the results of the sleep study.  At present, she does not meet criteria for CPAP.  If she continues to be symptomatic, she may require a repeat study with MLST testing to evaluate for idiopathic hypersomnia or narcolepsy

## 2017-07-01 NOTE — Progress Notes (Signed)
[facility] Summary Report / Loop Recorder 

## 2017-07-01 NOTE — Telephone Encounter (Signed)
Left sleep study results and recommendations on patient's voicemail. Call back if she has any questions and/or concerns.

## 2017-07-02 ENCOUNTER — Other Ambulatory Visit: Payer: Self-pay | Admitting: Family Medicine

## 2017-07-02 ENCOUNTER — Ambulatory Visit (HOSPITAL_COMMUNITY): Payer: BC Managed Care – PPO

## 2017-07-02 ENCOUNTER — Telehealth: Payer: Self-pay

## 2017-07-02 ENCOUNTER — Encounter: Payer: Self-pay | Admitting: Obstetrics and Gynecology

## 2017-07-02 ENCOUNTER — Ambulatory Visit (HOSPITAL_COMMUNITY)
Admission: RE | Admit: 2017-07-02 | Discharge: 2017-07-02 | Disposition: A | Discharge status: Home / Self Care | Payer: BC Managed Care – PPO | Source: Ambulatory Visit | Attending: Family Medicine | Admitting: Family Medicine

## 2017-07-02 DIAGNOSIS — E042 Nontoxic multinodular goiter: Secondary | ICD-10-CM

## 2017-07-02 NOTE — Telephone Encounter (Signed)
Carolyne Fiscal called to inform us that patient will need order changed to Ultrasound thyroid

## 2017-07-03 ENCOUNTER — Ambulatory Visit: Payer: BC Managed Care – PPO | Admitting: Obstetrics and Gynecology

## 2017-07-05 LAB — CUP PACEART REMOTE DEVICE CHECK
MDC IDC PG IMPLANT DT: 20171120
MDC IDC SESS DTM: 20190319094008

## 2017-07-10 ENCOUNTER — Other Ambulatory Visit: Payer: Self-pay | Admitting: Internal Medicine

## 2017-07-15 ENCOUNTER — Encounter: Payer: BC Managed Care – PPO | Admitting: Physical Medicine & Rehabilitation

## 2017-07-15 ENCOUNTER — Encounter: Payer: BC Managed Care – PPO | Attending: Physical Medicine & Rehabilitation | Admitting: Registered Nurse

## 2017-07-15 ENCOUNTER — Other Ambulatory Visit: Payer: Self-pay

## 2017-07-15 ENCOUNTER — Encounter: Payer: Self-pay | Admitting: Registered Nurse

## 2017-07-15 VITALS — BP 118/72 | HR 79 | Ht 63.0 in | Wt 240.6 lb

## 2017-07-15 DIAGNOSIS — M25552 Pain in left hip: Secondary | ICD-10-CM | POA: Diagnosis not present

## 2017-07-15 DIAGNOSIS — M545 Low back pain, unspecified: Secondary | ICD-10-CM

## 2017-07-15 DIAGNOSIS — I471 Supraventricular tachycardia: Secondary | ICD-10-CM | POA: Insufficient documentation

## 2017-07-15 DIAGNOSIS — F418 Other specified anxiety disorders: Secondary | ICD-10-CM | POA: Diagnosis not present

## 2017-07-15 DIAGNOSIS — G4730 Sleep apnea, unspecified: Secondary | ICD-10-CM | POA: Diagnosis not present

## 2017-07-15 DIAGNOSIS — E119 Type 2 diabetes mellitus without complications: Secondary | ICD-10-CM | POA: Diagnosis not present

## 2017-07-15 DIAGNOSIS — Z5181 Encounter for therapeutic drug level monitoring: Secondary | ICD-10-CM

## 2017-07-15 DIAGNOSIS — M25562 Pain in left knee: Secondary | ICD-10-CM | POA: Diagnosis not present

## 2017-07-15 DIAGNOSIS — Z833 Family history of diabetes mellitus: Secondary | ICD-10-CM | POA: Insufficient documentation

## 2017-07-15 DIAGNOSIS — G894 Chronic pain syndrome: Secondary | ICD-10-CM | POA: Diagnosis not present

## 2017-07-15 DIAGNOSIS — M25551 Pain in right hip: Secondary | ICD-10-CM | POA: Insufficient documentation

## 2017-07-15 DIAGNOSIS — G8929 Other chronic pain: Secondary | ICD-10-CM | POA: Insufficient documentation

## 2017-07-15 DIAGNOSIS — Z8249 Family history of ischemic heart disease and other diseases of the circulatory system: Secondary | ICD-10-CM | POA: Diagnosis not present

## 2017-07-15 DIAGNOSIS — Z79899 Other long term (current) drug therapy: Secondary | ICD-10-CM

## 2017-07-15 DIAGNOSIS — Z809 Family history of malignant neoplasm, unspecified: Secondary | ICD-10-CM | POA: Insufficient documentation

## 2017-07-15 DIAGNOSIS — M160 Bilateral primary osteoarthritis of hip: Secondary | ICD-10-CM | POA: Diagnosis not present

## 2017-07-15 DIAGNOSIS — I10 Essential (primary) hypertension: Secondary | ICD-10-CM | POA: Diagnosis not present

## 2017-07-15 MED ORDER — HYDROCODONE-ACETAMINOPHEN 10-325 MG PO TABS: 1.0000 | ORAL_TABLET | Freq: Four times a day (QID) | ORAL | 0 refills | Status: DC | PRN

## 2017-07-15 NOTE — Progress Notes (Signed)
Subjective:    Patient ID: [name], female    DOB: 09-05-1960, 57 y.o.   MRN: [id number]  HPI: [name] is a 57 year old female who returns for follow up appointment for chronic pain and medication refill. She states her pain is located in her lower back and left knee. She rates her pain 6. Her current exercise regime is walking.   [name] Morphine Equivalent is 41.33 MME.  Last UDS was Performed on 05/13/2017, it was consistent.   Pain Inventory Average Pain 8 Pain Right Now 6 My pain is sharp, dull, stabbing and tingling  In the last 24 hours, has pain interfered with the following? General activity 7 Relation with others 8 Enjoyment of life 10 What TIME of day is your pain at its worst? evening Sleep (in general) Fair  Pain is worse with: walking, bending and standing Pain improves with: rest, heat/ice and medication Relief from Meds: n/a  Mobility walk without assistance how many minutes can you walk? 45 ability to climb steps?  yes do you drive?  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  no  Physicians involved in your care Any changes since last visit?  no   Family History  Problem Relation Age of Onset  . Hypertension Mother   . Alzheimer's disease Mother   . Diabetes Father   . Breast cancer Sister 78  . Anesthesia problems Neg Hx   . Hypotension Neg Hx   . Malignant hyperthermia Neg Hx   . Pseudochol deficiency Neg Hx   . Colon cancer Neg Hx   . Esophageal cancer Neg Hx   . Stomach cancer Neg Hx   . Rectal cancer Neg Hx    Social History   Socioeconomic History  . Marital status: Divorced    Spouse name: Not on file  . Number of children: 0  . Years of education: Not on file  . Highest education  level: Not on file  Occupational History  . Occupation: TEACHER ASSISTANT  Social Needs  . Financial resource strain: Not on file  . Food insecurity:    Worry: Not on file    Inability: Not on file  . Transportation needs:    Medical: Not on file    Non-medical: Not on file  Tobacco Use  . Smoking status: Never Smoker  . Smokeless tobacco: Never Used  Substance and Sexual Activity  . Alcohol use: No  . Drug use: No  . Sexual activity: Yes  Lifestyle  . Physical activity:    Days per week: Not on file    Minutes per session: Not on file  . Stress: Not on file  Relationships  . Social connections:    Talks on phone: Not on file    Gets together: Not on file    Attends religious service: Not on file    Active member of club or organization: Not on file    Attends meetings of clubs or organizations: Not on file    Relationship status: Not on file  Other Topics Concern  . Not on file  Social History Narrative  . Not on file   Past Surgical History:  Procedure Laterality Date  . ANAL EXAMINATION UNDER ANESTHESIA  02/21/11   anal fistula  .  BREAST SURGERY  patient does not remember date of procedure   pull fluid off lft br  . ELECTROPHYSIOLOGIC STUDY N/A 05/05/2015   Procedure: SVT Ablation;  Surgeon: Will Meredith Leeds, MD;  Location: [facility];  Service: Cardiovascular;  Laterality: N/A;  . EP IMPLANTABLE DEVICE N/A 01/30/2016   Procedure: Loop Recorder Insertion;  Surgeon: Sanda Klein, MD;  Location: [facility];  Service: Cardiovascular;  Laterality: N/A;  . INCISE AND DRAIN ABCESS     abscess on right thigh and buttock  . KNEE ARTHROSCOPY     left  . SHOULDER SURGERY  04/14/09   right   Past Medical History:  Diagnosis Date  . Abscess    increased drainage from abscess on buttock  . Anal fistula   . Anxiety   . Bilateral hip pain 05/27/2015  . Chronic pain syndrome 05/27/2015  . Depression    sees [name]  . Diabetes mellitus without  complication ([place])   . Hypertension   . Sleep apnea    2008- sleep study, neg. for sleep apnea   . SVT (supraventricular tachycardia) (HCC)    BP 118/72   Pulse 79   Ht 5\' 3"  (1.6 m) Comment: pt reported  Wt 240 lb 9.6 oz (109.1 kg)   LMP 02/09/2017   SpO2 95%   BMI 42.62 kg/m   Opioid Risk Score:   Fall Risk Score:  `1  Depression screen PHQ 2/9  Depression screen [facility] 2/9 07/15/2017 05/23/2017 04/12/2017 03/21/2017 12/24/2016 11/28/2016 02/22/2016  Decreased Interest 0 1 3 0 1 0 0  Down, Depressed, Hopeless 0 1 2 0 1 0 0  PHQ - 2 Score 0 2 5 0 2 0 0  Altered sleeping - 0 3 - - - -  Tired, decreased energy - 1 3 - - - -  Change in appetite - 0 2 - - - -  Feeling bad or failure about yourself  - 0 1 - - - -  Trouble concentrating - 0 1 - - - -  Moving slowly or fidgety/restless - 0 0 - - - -  Suicidal thoughts - 0 0 - - - -  PHQ-9 Score - 3 15 - - - -  Difficult doing work/chores - - - - - - -  Some recent data might be hidden    Review of Systems  Constitutional: Negative.   HENT: Negative.   Eyes: Negative.   Respiratory: Negative.   Cardiovascular: Negative.   Gastrointestinal: Negative.   Endocrine: Negative.   Genitourinary: Negative.   Musculoskeletal: Negative.   Skin: Negative.   Allergic/Immunologic: Negative.   Neurological: Negative.   Hematological: Negative.   Psychiatric/Behavioral: Negative.   All other systems reviewed and are negative.      Objective:   Physical Exam  Constitutional: She is oriented to person, place, and time. She appears well-developed and well-nourished.  HENT:  Head: Normocephalic and atraumatic.  Neck: Normal range of motion. Neck supple.  Cardiovascular: Normal rate and regular rhythm.  Pulmonary/Chest: Effort normal and breath sounds normal.  Musculoskeletal:  Normal Muscle Bulk and Muscle Testing Reveals:  Upper Extremities: Full ROM and Muscle Strength 5/5 Thoracic Paraspinal Tenderness: T-7-T-9 Lumbar Paraspinal  Tenderness: L-3-L-5 Lower Extremities: Full ROM and Muscle Strength 5/5 Arises from Table with ease Narrow based Gait   Neurological: She is alert and oriented to person, place, and time.  Skin: Skin is warm and dry.  Psychiatric: She has a normal mood and  affect.  Nursing note and vitals reviewed.         Assessment & Plan:  1.Chronic BilateralHip pain L>R---endstage OA of hips..Continue HEP as tolerated and Heat and Ice Therapy Refilled:Hydrocodone 10/325 one q6 hours as needed for moderate pain #120, 07/15/2017. Second script e-scribed for the following month. We will continue the opioid monitoring program, this consists of regular clinic visits, examinations, urine drug screen, pill counts as well as use of [place] Controlled Substance Reporting System. 2. Lumbar Radiculitis: Continue medication regimen with Pamelor. Continue Cymbalta and Continue  HEP as Tolerated.  Continue to Monitor. 07/15/2017.- 3. Morbid obesity: Continue with healthy diet Regime and HEP. Encouraged to continue with healthy diet regime and exercise. 07/15/2017. 4. Bilateral Greater Trochanteric Bursitis: No complaints today.Continue with Heat and Ice Therapy. Continue to Monitor. 07/15/2017. 5. Left Knee Pain: Continue with HEP as Tolerated. Continue to Monitor.   20 minutes of face to face patient care time was spent during this visit. All questions were encouraged and answered.

## 2017-07-29 LAB — CUP PACEART REMOTE DEVICE CHECK
Implantable Pulse Generator Implant Date: 20171120
MDC IDC SESS DTM: 20190421093524

## 2017-08-02 ENCOUNTER — Ambulatory Visit (INDEPENDENT_AMBULATORY_CARE_PROVIDER_SITE_OTHER): Payer: BC Managed Care – PPO | Admitting: *Deleted

## 2017-08-02 DIAGNOSIS — R55 Syncope and collapse: Secondary | ICD-10-CM | POA: Diagnosis not present

## 2017-08-02 NOTE — Progress Notes (Signed)
[facility] Summary Report / Loop Recorder 

## 2017-08-06 ENCOUNTER — Other Ambulatory Visit: Payer: Self-pay | Admitting: Medical

## 2017-08-06 ENCOUNTER — Other Ambulatory Visit: Payer: Self-pay | Admitting: Registered Nurse

## 2017-08-09 ENCOUNTER — Telehealth: Payer: Self-pay | Admitting: Medical

## 2017-08-09 ENCOUNTER — Other Ambulatory Visit: Payer: Self-pay | Admitting: Medical

## 2017-08-09 NOTE — Telephone Encounter (Signed)
Get in for fasting med check

## 2017-08-12 NOTE — Telephone Encounter (Signed)
Called and left message that she needs to come in for a fasting medcheck

## 2017-08-27 ENCOUNTER — Ambulatory Visit: Payer: BC Managed Care – PPO | Admitting: Internal Medicine

## 2017-08-27 DIAGNOSIS — Z0289 Encounter for other administrative examinations: Secondary | ICD-10-CM

## 2017-08-27 LAB — CUP PACEART REMOTE DEVICE CHECK
Implantable Pulse Generator Implant Date: 20171120
MDC IDC SESS DTM: 20190524101232

## 2017-09-02 ENCOUNTER — Ambulatory Visit (INDEPENDENT_AMBULATORY_CARE_PROVIDER_SITE_OTHER): Payer: BC Managed Care – PPO | Admitting: *Deleted

## 2017-09-02 DIAGNOSIS — R55 Syncope and collapse: Secondary | ICD-10-CM | POA: Diagnosis not present

## 2017-09-04 NOTE — Progress Notes (Signed)
[facility] Summary Report / Loop Recorder 

## 2017-09-05 ENCOUNTER — Telehealth: Payer: Self-pay

## 2017-09-05 DIAGNOSIS — M160 Bilateral primary osteoarthritis of hip: Secondary | ICD-10-CM

## 2017-09-05 DIAGNOSIS — G894 Chronic pain syndrome: Secondary | ICD-10-CM

## 2017-09-05 MED ORDER — HYDROCODONE-ACETAMINOPHEN 10-325 MG PO TABS: 1.0000 | ORAL_TABLET | Freq: Four times a day (QID) | ORAL | 0 refills | Status: DC | PRN

## 2017-09-05 NOTE — Telephone Encounter (Signed)
Pt called stating she is returning Brandt call.

## 2017-09-05 NOTE — Telephone Encounter (Signed)
Return [name] call, she has a scheduled appointment with Dr. Naaman Plummer on 09/23/2017. Her last prescription of Hydrocodone was picked up on 08/15/2017 per PMP Aware- Web-Site. Hydrocodone will be e-scribe, she verbalizes understanding.

## 2017-09-10 ENCOUNTER — Encounter: Payer: BC Managed Care – PPO | Admitting: Medical

## 2017-09-17 ENCOUNTER — Other Ambulatory Visit: Payer: Self-pay | Admitting: Internal Medicine

## 2017-09-17 ENCOUNTER — Other Ambulatory Visit: Payer: Self-pay | Admitting: Medical

## 2017-09-17 DIAGNOSIS — H6503 Acute serous otitis media, bilateral: Secondary | ICD-10-CM

## 2017-09-17 NOTE — Telephone Encounter (Signed)
Is this ok to refill?  

## 2017-09-20 ENCOUNTER — Other Ambulatory Visit: Payer: Self-pay | Admitting: Medical

## 2017-09-20 NOTE — Telephone Encounter (Signed)
Is this okay to refill? 

## 2017-09-23 ENCOUNTER — Encounter: Payer: Self-pay | Admitting: Physical Medicine & Rehabilitation

## 2017-09-23 ENCOUNTER — Encounter
Payer: BC Managed Care – PPO | Attending: Physical Medicine & Rehabilitation | Admitting: Physical Medicine & Rehabilitation

## 2017-09-23 VITALS — BP 107/70 | HR 84 | Ht 63.0 in | Wt 233.0 lb

## 2017-09-23 DIAGNOSIS — M7061 Trochanteric bursitis, right hip: Secondary | ICD-10-CM | POA: Insufficient documentation

## 2017-09-23 DIAGNOSIS — M5416 Radiculopathy, lumbar region: Secondary | ICD-10-CM | POA: Diagnosis not present

## 2017-09-23 DIAGNOSIS — I10 Essential (primary) hypertension: Secondary | ICD-10-CM | POA: Diagnosis not present

## 2017-09-23 DIAGNOSIS — E119 Type 2 diabetes mellitus without complications: Secondary | ICD-10-CM | POA: Diagnosis not present

## 2017-09-23 DIAGNOSIS — G894 Chronic pain syndrome: Secondary | ICD-10-CM | POA: Diagnosis not present

## 2017-09-23 DIAGNOSIS — Z8249 Family history of ischemic heart disease and other diseases of the circulatory system: Secondary | ICD-10-CM | POA: Diagnosis not present

## 2017-09-23 DIAGNOSIS — M160 Bilateral primary osteoarthritis of hip: Secondary | ICD-10-CM | POA: Diagnosis not present

## 2017-09-23 DIAGNOSIS — Z809 Family history of malignant neoplasm, unspecified: Secondary | ICD-10-CM | POA: Diagnosis not present

## 2017-09-23 DIAGNOSIS — I471 Supraventricular tachycardia: Secondary | ICD-10-CM | POA: Diagnosis not present

## 2017-09-23 DIAGNOSIS — Z833 Family history of diabetes mellitus: Secondary | ICD-10-CM | POA: Insufficient documentation

## 2017-09-23 DIAGNOSIS — G4730 Sleep apnea, unspecified: Secondary | ICD-10-CM | POA: Diagnosis not present

## 2017-09-23 DIAGNOSIS — M7062 Trochanteric bursitis, left hip: Secondary | ICD-10-CM | POA: Insufficient documentation

## 2017-09-23 DIAGNOSIS — G8929 Other chronic pain: Secondary | ICD-10-CM | POA: Diagnosis present

## 2017-09-23 DIAGNOSIS — F418 Other specified anxiety disorders: Secondary | ICD-10-CM | POA: Diagnosis not present

## 2017-09-23 DIAGNOSIS — M25551 Pain in right hip: Secondary | ICD-10-CM | POA: Diagnosis not present

## 2017-09-23 DIAGNOSIS — M25552 Pain in left hip: Secondary | ICD-10-CM | POA: Diagnosis not present

## 2017-09-23 MED ORDER — HYDROCODONE-ACETAMINOPHEN 10-325 MG PO TABS: 1.0000 | ORAL_TABLET | Freq: Four times a day (QID) | ORAL | 0 refills | Status: DC | PRN

## 2017-09-23 NOTE — Progress Notes (Signed)
Subjective:    Patient ID: [name], female    DOB: 1960/10/22, 57 y.o.   MRN: [id number]  HPI   [name] is here in follow up of her chronic pain. She has been fairly stable although she has been having more pain at night which refers from the top of her hips to to the back of her thigh and lateral knee. The left side is more painful than the right. She is a side sleeper and usually sleeps on her right side.  She finds it difficult to get moving in the morning and it is hard for her to stand up straight and begin walking initially after sitting for prolonged periods of time.  Her low back pain is been fairly reasonable.  She tries to walk and do some basic stretches daily.  For pain she is using Norco 10/325 1 every 6 hours as needed along with nortriptyline and Voltaren 50 mg twice daily.  She is reduced her extra Tylenol to only twice daily.   Pain Inventory Average Pain 8 Pain Right Now 6 My pain is sharp, dull and stabbing  In the last 24 hours, has pain interfered with the following? General activity 10 Relation with others 10 Enjoyment of life 10 What TIME of day is your pain at its worst? evening Sleep (in general) Poor  Pain is worse with: walking, bending, standing and some activites Pain improves with: medication Relief from Meds: 6  Mobility walk without assistance ability to climb steps?  yes do you drive?  yes  Function employed # of hrs/week 40 I need assistance with the following:  meal prep, household duties and shopping  Neuro/Psych No problems in this area  Prior Studies Any changes since last visit?  no  Physicians involved in your care Any changes since last visit?  no   Family History  Problem Relation Age of Onset  . Hypertension Mother   . Alzheimer's disease Mother   . Diabetes Father   . Breast cancer Sister 11  . Anesthesia problems Neg Hx   . Hypotension Neg Hx   . Malignant hyperthermia Neg Hx   . Pseudochol  deficiency Neg Hx   . Colon cancer Neg Hx   . Esophageal cancer Neg Hx   . Stomach cancer Neg Hx   . Rectal cancer Neg Hx    Social History   Socioeconomic History  . Marital status: Divorced    Spouse name: Not on file  . Number of children: 0  . Years of education: Not on file  . Highest education level: Not on file  Occupational History  . Occupation: TEACHER ASSISTANT  Social Needs  . Financial resource strain: Not on file  . Food insecurity:    Worry: Not on file    Inability: Not on file  . Transportation needs:    Medical: Not on file    Non-medical: Not on file  Tobacco Use  . Smoking status: Never Smoker  . Smokeless tobacco: Never Used  Substance and Sexual Activity  . Alcohol use: No  . Drug use: No  . Sexual activity: Yes  Lifestyle  . Physical activity:    Days per week: Not on file    Minutes per session: Not on file  . Stress: Not on file  Relationships  . Social connections:    Talks on phone: Not on file    Gets together: Not on file    Attends religious service: Not on file  Active member of club or organization: Not on file    Attends meetings of clubs or organizations: Not on file    Relationship status: Not on file  Other Topics Concern  . Not on file  Social History Narrative  . Not on file   Past Surgical History:  Procedure Laterality Date  . ANAL EXAMINATION UNDER ANESTHESIA  02/21/11   anal fistula  . BREAST SURGERY  patient does not remember date of procedure   pull fluid off lft br  . ELECTROPHYSIOLOGIC STUDY N/A 05/05/2015   Procedure: SVT Ablation;  Surgeon: Will Meredith Leeds, MD;  Location: [place] [facility];  Service: Cardiovascular;  Laterality: N/A;  . EP IMPLANTABLE DEVICE N/A 01/30/2016   Procedure: Loop Recorder Insertion;  Surgeon: Sanda Klein, MD;  Location: [place] [facility];  Service: Cardiovascular;  Laterality: N/A;  . INCISE AND DRAIN ABCESS     abscess on right thigh and buttock  . KNEE ARTHROSCOPY      left  . SHOULDER SURGERY  04/14/09   right   Past Medical History:  Diagnosis Date  . Abscess    increased drainage from abscess on buttock  . Anal fistula   . Anxiety   . Bilateral hip pain 05/27/2015  . Chronic pain syndrome 05/27/2015  . Depression    sees [name]  . Diabetes mellitus without complication ([place])   . Hypertension   . Sleep apnea    2008- sleep study, neg. for sleep apnea   . SVT (supraventricular tachycardia) ([place])    LMP 02/09/2017   Opioid Risk Score:   Fall Risk Score:  `1  Depression screen PHQ 2/9  Depression screen [facility] 2/9 07/15/2017 05/23/2017 04/12/2017 03/21/2017 12/24/2016 11/28/2016 02/22/2016  Decreased Interest 0 1 3 0 1 0 0  Down, Depressed, Hopeless 0 1 2 0 1 0 0  PHQ - 2 Score 0 2 5 0 2 0 0  Altered sleeping - 0 3 - - - -  Tired, decreased energy - 1 3 - - - -  Change in appetite - 0 2 - - - -  Feeling bad or failure about yourself  - 0 1 - - - -  Trouble concentrating - 0 1 - - - -  Moving slowly or fidgety/restless - 0 0 - - - -  Suicidal thoughts - 0 0 - - - -  PHQ-9 Score - 3 15 - - - -  Difficult doing work/chores - - - - - - -  Some recent data might be hidden     Review of Systems  Constitutional: Negative.   HENT: Negative.   Eyes: Negative.   Respiratory: Negative.   Cardiovascular: Negative.   Gastrointestinal: Negative.   Endocrine: Negative.   Genitourinary: Negative.   Musculoskeletal: Positive for arthralgias, back pain and myalgias.  Skin: Negative.   Allergic/Immunologic: Negative.   Neurological: Negative.   Hematological: Negative.   Psychiatric/Behavioral: Negative.   All other systems reviewed and are negative.      Objective:   Physical Exam  General: No acute distress, obese HEENT: EOMI, oral membranes moist Cards: reg rate  Chest: normal effort Abdomen: Soft, NT, ND Skin: dry, intact Extremities: no edema MSK: Patient has ongoing antalgic gait left more than right.  She had some tenderness with  palpation over the lower lumbar spine although pain is more located along the bilateral greater trochanters left more than right.  Cross leg maneuver reproduces pain exactly on the left side  and right.   Neuro: CN II-XII grossly intact.  Sensation intact to light touch in all LE dermatomes Reflexes 1+ in b/l LE Strength is5/5 in all 4 limbs  Skin: Warm and Dry  Assessment & Plan:  57 y/o female with pmh of SVT, DM, HTN, anxiety/depression and psh of left knee scope and right shoulder scope presents in b/l hip pain, L>R.   1. Chronic B/l Hip pain L>R---Mod OA left hip by MRI and mild OA right hip by MRI -Recent increase in pain.  Symptoms most consistent today with bilateral greater trochanter bursitis. -continue hydrocodone 10/325 for pain q6 prn #120. We will continue the opioid monitoring program, this consists of regular clinic visits, examinations, routine drug screening, pill counts as well as use of [place] Controlled Substance Reporting System. [place] was reviewed today.   -Continue extra Tylenol within recommended limitations -trial of baclofen for muscle spasms -weight loss as below -Continue low-dose diclofenac twice daily            -Provided greater trochanter stretches/iliotibial band stretches today.   -     -Advised her to use ample ice, 3-4 times daily to each hip.     -Consider hip injections if the above prove unsuccessful. 2. GERD---protonix -senokot s for constipation. 3. Morbid obesity Continues working on weight loss with improved diet.     .  Followup with me or nurse practitioner in about a month.  About 15 minutes of face to face patient care time were spent during this visit. All questions were encouraged and answered.

## 2017-09-23 NOTE — Patient Instructions (Signed)

## 2017-10-02 ENCOUNTER — Ambulatory Visit: Payer: BC Managed Care – PPO | Admitting: Medical

## 2017-10-02 VITALS — BP 114/70 | HR 69 | Temp 98.1°F | Resp 16 | Ht 64.0 in | Wt 235.0 lb

## 2017-10-02 DIAGNOSIS — E042 Nontoxic multinodular goiter: Secondary | ICD-10-CM | POA: Diagnosis not present

## 2017-10-02 DIAGNOSIS — I471 Supraventricular tachycardia: Secondary | ICD-10-CM

## 2017-10-02 DIAGNOSIS — F329 Major depressive disorder, single episode, unspecified: Secondary | ICD-10-CM

## 2017-10-02 DIAGNOSIS — E790 Hyperuricemia without signs of inflammatory arthritis and tophaceous disease: Secondary | ICD-10-CM

## 2017-10-02 DIAGNOSIS — G894 Chronic pain syndrome: Secondary | ICD-10-CM

## 2017-10-02 DIAGNOSIS — IMO0002 Reserved for concepts with insufficient information to code with codable children: Secondary | ICD-10-CM

## 2017-10-02 DIAGNOSIS — E118 Type 2 diabetes mellitus with unspecified complications: Secondary | ICD-10-CM

## 2017-10-02 DIAGNOSIS — E669 Obesity, unspecified: Secondary | ICD-10-CM

## 2017-10-02 DIAGNOSIS — G4700 Insomnia, unspecified: Secondary | ICD-10-CM

## 2017-10-02 DIAGNOSIS — R7989 Other specified abnormal findings of blood chemistry: Secondary | ICD-10-CM

## 2017-10-02 DIAGNOSIS — E1165 Type 2 diabetes mellitus with hyperglycemia: Secondary | ICD-10-CM

## 2017-10-02 DIAGNOSIS — R4589 Other symptoms and signs involving emotional state: Secondary | ICD-10-CM

## 2017-10-02 DIAGNOSIS — E785 Hyperlipidemia, unspecified: Secondary | ICD-10-CM | POA: Diagnosis not present

## 2017-10-02 DIAGNOSIS — F411 Generalized anxiety disorder: Secondary | ICD-10-CM | POA: Diagnosis not present

## 2017-10-02 MED ORDER — ATORVASTATIN CALCIUM 20 MG PO TABS: 20.0000 mg | ORAL_TABLET | Freq: Every day | ORAL | 1 refills | Status: DC
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
## 2017-10-02 NOTE — Patient Instructions (Addendum)
Recommendations:  I am sorry for your recent loss in your family.  Please continue with counseling regularly, continue Cymbalta medication for now for both pain and mood.  Consider having a follow up visit with [name], your psychiatrist   Diabetes - we are checking labs today  Continue Soliqua and Metformin  We will call with lab results  See your eye doctor yearly  Check your feet daily for wounds or sores, and come in if non healing wounds  Thyroid   We are checking updated labs today  Your last ultrasound of the thyroid goiter was 06/2017  Sleep   STOP Clonidine by taking 1/2 tablet every other night for the next week, then STOP this completely   You are apparently not getting any benefit from this medication  Let me review your labs and chart, and we can decide on next steps   Chronic back and hip pain  Follow up with Dr. Tamala Julian as planned  Your current pain medications include Hydrocodone, Nortriptyline and Baclofen   High cholesterol  Continue Atorvastatin and we will call with lab results   Medications in question:  Dr. Sallyanne Kuster had started you on Bystolic medication in 11/5091, but I don't see it in your list today and can't tell if had been discontinued  You were taking Allopurinol for gout, but I don't see it listed today  Please ask Dr. Tamala Julian about this.  You mentioned he stopped this medication  Allopurinol is used to lower uric acid and reduce your risk of gout and kidney damage from excess uric acid     Fall Prevention in the Home Falls can cause injuries. They can happen to people of all ages. There are many things you can do to make your home safe and to help prevent falls. What can I do on the outside of my home?  Regularly fix the edges of walkways and driveways and fix any cracks.  Remove anything that might make you trip as you walk through a door, such as a raised step or threshold.  Trim any bushes or trees on the path to your  home.  Use bright outdoor lighting.  Clear any walking paths of anything that might make someone trip, such as rocks or tools.  Regularly check to see if handrails are loose or broken. Make sure that both sides of any steps have handrails.  Any raised decks and porches should have guardrails on the edges.  Have any leaves, snow, or ice cleared regularly.  Use sand or salt on walking paths during winter.  Clean up any spills in your garage right away. This includes oil or grease spills. What can I do in the bathroom?  Use night lights.  Install grab bars by the toilet and in the tub and shower. Do not use towel bars as grab bars.  Use non-skid mats or decals in the tub or shower.  If you need to sit down in the shower, use a plastic, non-slip stool.  Keep the floor dry. Clean up any water that spills on the floor as soon as it happens.  Remove soap buildup in the tub or shower regularly.  Attach bath mats securely with double-sided non-slip rug tape.  Do not have throw rugs and other things on the floor that can make you trip. What can I do in the bedroom?  Use night lights.  Make sure that you have a light by your bed that is easy to reach.  Do not  use any sheets or blankets that are too big for your bed. They should not hang down onto the floor.  Have a firm chair that has side arms. You can use this for support while you get dressed.  Do not have throw rugs and other things on the floor that can make you trip. What can I do in the kitchen?  Clean up any spills right away.  Avoid walking on wet floors.  Keep items that you use a lot in easy-to-reach places.  If you need to reach something above you, use a strong step stool that has a grab bar.  Keep electrical cords out of the way.  Do not use floor polish or wax that makes floors slippery. If you must use wax, use non-skid floor wax.  Do not have throw rugs and other things on the floor that can make you  trip. What can I do with my stairs?  Do not leave any items on the stairs.  Make sure that there are handrails on both sides of the stairs and use them. Fix handrails that are broken or loose. Make sure that handrails are as long as the stairways.  Check any carpeting to make sure that it is firmly attached to the stairs. Fix any carpet that is loose or worn.  Avoid having throw rugs at the top or bottom of the stairs. If you do have throw rugs, attach them to the floor with carpet tape.  Make sure that you have a light switch at the top of the stairs and the bottom of the stairs. If you do not have them, ask someone to add them for you. What else can I do to help prevent falls?  Wear shoes that: ? Do not have high heels. ? Have rubber bottoms. ? Are comfortable and fit you well. ? Are closed at the toe. Do not wear sandals.  If you use a stepladder: ? Make sure that it is fully opened. Do not climb a closed stepladder. ? Make sure that both sides of the stepladder are locked into place. ? Ask someone to hold it for you, if possible.  Clearly mark and make sure that you can see: ? Any grab bars or handrails. ? First and last steps. ? Where the edge of each step is.  Use tools that help you move around (mobility aids) if they are needed. These include: ? Canes. ? Walkers. ? Scooters. ? Crutches.  Turn on the lights when you go into a dark area. Replace any light bulbs as soon as they burn out.  Set up your furniture so you have a clear path. Avoid moving your furniture around.  If any of your floors are uneven, fix them.  If there are any pets around you, be aware of where they are.  Review your medicines with your doctor. Some medicines can make you feel dizzy. This can increase your chance of falling. Ask your doctor what other things that you can do to help prevent falls. This information is not intended to replace advice given to you by your health care provider. Make  sure you discuss any questions you have with your health care provider. Document Released: 12/23/2008 Document Revised: 08/04/2015 Document Reviewed: 04/02/2014 Elsevier Interactive Patient Education  [employer].

## 2017-10-03 ENCOUNTER — Other Ambulatory Visit: Payer: Self-pay

## 2017-10-03 DIAGNOSIS — IMO0002 Reserved for concepts with insufficient information to code with codable children: Secondary | ICD-10-CM

## 2017-10-03 DIAGNOSIS — E1165 Type 2 diabetes mellitus with hyperglycemia: Secondary | ICD-10-CM

## 2017-10-03 DIAGNOSIS — E118 Type 2 diabetes mellitus with unspecified complications: Principal | ICD-10-CM

## 2017-10-03 LAB — COMPREHENSIVE METABOLIC PANEL WITH GFR
ALT: 14 [IU]/L (ref 0–32)
AST: 9 [IU]/L (ref 0–40)
Albumin/Globulin Ratio: 1.5 (ref 1.2–2.2)
Albumin: 3.8 g/dL (ref 3.5–5.5)
Alkaline Phosphatase: 100 [IU]/L (ref 39–117)
BUN/Creatinine Ratio: 15 (ref 9–23)
BUN: 21 mg/dL (ref 6–24)
Bilirubin Total: 0.2 mg/dL (ref 0.0–1.2)
CO2: 24 mmol/L (ref 20–29)
Calcium: 9.9 mg/dL (ref 8.7–10.2)
Chloride: 93 mmol/L — ABNORMAL LOW (ref 96–106)
Creatinine, Ser: 1.43 mg/dL — ABNORMAL HIGH (ref 0.57–1.00)
GFR calc Af Amer: 47 mL/min/{1.73_m2} — ABNORMAL LOW
GFR calc non Af Amer: 41 mL/min/{1.73_m2} — ABNORMAL LOW
Globulin, Total: 2.5 g/dL (ref 1.5–4.5)
Glucose: 453 mg/dL — ABNORMAL HIGH (ref 65–99)
Potassium: 4.6 mmol/L (ref 3.5–5.2)
Sodium: 134 mmol/L (ref 134–144)
Total Protein: 6.3 g/dL (ref 6.0–8.5)

## 2017-10-03 LAB — LIPID PANEL
Chol/HDL Ratio: 6.2 ratio — ABNORMAL HIGH (ref 0.0–4.4)
Cholesterol, Total: 193 mg/dL (ref 100–199)
HDL: 31 mg/dL — AB (ref >39)
LDL CALC: 99 mg/dL (ref 0–99)
TRIGLYCERIDES: 313 mg/dL — AB (ref 0–149)
VLDL Cholesterol Cal: 63 mg/dL — ABNORMAL HIGH (ref 5–40)

## 2017-10-03 LAB — CBC
HEMATOCRIT: 38 % (ref 34.0–46.6)
HEMOGLOBIN: 11.8 g/dL (ref 11.1–15.9)
MCH: 25.4 pg — ABNORMAL LOW (ref 26.6–33.0)
MCHC: 31.1 g/dL — ABNORMAL LOW (ref 31.5–35.7)
MCV: 82 fL (ref 79–97)
Platelets: 411 10*3/uL (ref 150–450)
RBC: 4.64 x10E6/uL (ref 3.77–5.28)
RDW: 14.7 % (ref 12.3–15.4)
WBC: 8.6 10*3/uL (ref 3.4–10.8)

## 2017-10-03 LAB — HEMOGLOBIN A1C
Est. average glucose Bld gHb Est-mCnc: 372 mg/dL
Hgb A1c MFr Bld: 14.6 % — ABNORMAL HIGH (ref 4.8–5.6)

## 2017-10-03 LAB — MICROALBUMIN / CREATININE URINE RATIO
CREATININE, UR: 83.9 mg/dL
Microalb/Creat Ratio: 17.5 mg/g creat (ref 0.0–30.0)
Microalbumin, Urine: 14.7 ug/mL

## 2017-10-03 LAB — THYROID PANEL WITH TSH
Free Thyroxine Index: 1.4 (ref 1.2–4.9)
T3 Uptake Ratio: 27 % (ref 24–39)
T4, Total: 5.3 ug/dL (ref 4.5–12.0)
TSH: 0.896 u[IU]/mL (ref 0.450–4.500)

## 2017-10-04 ENCOUNTER — Ambulatory Visit: Payer: BC Managed Care – PPO | Admitting: Medical

## 2017-10-04 VITALS — BP 110/70 | HR 78 | Temp 97.7°F | Resp 16 | Ht 64.0 in | Wt 235.0 lb

## 2017-10-04 DIAGNOSIS — E118 Type 2 diabetes mellitus with unspecified complications: Secondary | ICD-10-CM

## 2017-10-04 DIAGNOSIS — E1165 Type 2 diabetes mellitus with hyperglycemia: Secondary | ICD-10-CM

## 2017-10-04 DIAGNOSIS — E785 Hyperlipidemia, unspecified: Secondary | ICD-10-CM | POA: Diagnosis not present

## 2017-10-04 DIAGNOSIS — E042 Nontoxic multinodular goiter: Secondary | ICD-10-CM | POA: Diagnosis not present

## 2017-10-04 DIAGNOSIS — IMO0002 Reserved for concepts with insufficient information to code with codable children: Secondary | ICD-10-CM

## 2017-10-04 MED ORDER — INSULIN PEN NEEDLE 32G X 4 MM MISC: 1.0000 | Freq: Every day | 11 refills | Status: DC

## 2017-10-04 MED ORDER — INSULIN ASPART 100 UNIT/ML FLEXPEN: 5.0000 [IU] | PEN_INJECTOR | Freq: Three times a day (TID) | SUBCUTANEOUS | 2 refills | Status: DC

## 2017-10-04 MED ORDER — SILVER SULFADIAZINE 1 % EX CREA: 1.0000 "application " | TOPICAL_CREAM | Freq: Every day | CUTANEOUS | 0 refills | Status: DC

## 2017-10-04 NOTE — Progress Notes (Signed)
Subjective: Chief Complaint  Patient presents with  . Follow-up    dm check eat 45 mins to 1 hr ago   Here for follow-up from the other day's visit.  I recently saw her for med check but found that her blood sugars were over 400 and her hemoglobin A1C was over 14%.  She endorses that she has been in fact taking her Metformin and Soliqua  Despite the fact that she was telling me she was getting relatively good numbers, her recent glucose here was over 400.    I asked her to come in to discuss mealtime insulin and med changes.  Also want her to be seen by endocrinology.  She apparently had seen Dr. Cruzita Lederer 2 years ago regarding thyroid issues, and she notes at that time her sugars were fine.  She requested a referral to a different endocrinologist.  She still wants me to help her with sleep problems discussed few days ago.   Past Medical History:  Diagnosis Date  . Abscess    increased drainage from abscess on buttock  . Anal fistula   . Anxiety   . Bilateral hip pain 05/27/2015  . Chronic pain syndrome 05/27/2015  . Depression    sees [name]  . Diabetes mellitus without complication ([place])   . Hypertension   . Sleep apnea    2008- sleep study, neg. for sleep apnea   . SVT (supraventricular tachycardia) ([place])    Current Outpatient Medications on File Prior to Visit  Medication Sig Dispense Refill  . atorvastatin (LIPITOR) 20 MG tablet Take 1 tablet (20 mg total) by mouth at bedtime. 90 tablet 1  . cetirizine (ZYRTEC) 10 MG tablet TAKE 1 TABLET BY MOUTH AT BEDTIME 30 tablet 2  . cloNIDine (CATAPRES) 0.1 MG tablet TAKE 1 TABLET BY MOUTH AT BEDTIME 30 tablet 2  . diclofenac (VOLTAREN) 50 MG EC tablet Take 1 tablet (50 mg total) by mouth 2 (two) times daily with a meal. 60 tablet 3  . DULoxetine (CYMBALTA) 60 MG capsule TAKE 1 CAPSULE BY MOUTH TWICE DAILY 60 capsule 0  . fluticasone (FLONASE) 50 MCG/ACT nasal spray Place 2 sprays into both nostrils daily. 16 g 2  . furosemide (LASIX)  40 MG tablet Take 1.5 tablets (60 mg total) by mouth daily. 135 tablet 0  . HYDROcodone-acetaminophen (NORCO) 10-325 MG tablet Take 1 tablet by mouth every 6 (six) hours as needed. 120 tablet 0  . Insulin Glargine-Lixisenatide (SOLIQUA) 100-33 UNT-MCG/ML SOPN Inject 50 Units as directed at bedtime. 15 mL 1  . nortriptyline (PAMELOR) 10 MG capsule Take 2 capsules (20 mg total) by mouth at bedtime. 60 capsule 2  . pantoprazole (PROTONIX) 40 MG tablet TAKE 1 TABLET BY MOUTH TWICE DAILY BEFORE MEAL(S) TAKE  30  TO  60  MINUTES  BEFORE  BREAKFAST  AND  DINNER 60 tablet 3  . metFORMIN (GLUCOPHAGE) 850 MG tablet Take 1 tablet (850 mg total) by mouth 2 (two) times daily with a meal. (Patient not taking: Reported on 10/04/2017) 180 tablet 3   No current facility-administered medications on file prior to visit.    ROS as in subjective   Objective: BP 110/70   Pulse 78   Temp 97.7 F (36.5 C) (Oral)   Resp 16   Ht 5\' 4"  (1.626 m)   Wt 235 lb (106.6 kg)   LMP 02/09/2017   SpO2 98%   BMI 40.34 kg/m   Gen: wd, wn, nad otherise not examined  Assessment: Encounter Diagnoses  Name Primary?  Marland Kitchen Uncontrolled diabetes mellitus with complications ([place]) Yes  . Hyperlipidemia, unspecified hyperlipidemia type   . Multinodular goiter      Plan: We discussed her recent lab work, uncontrolled sugars, we discussed her thyroid labs her cholesterol labs.  Counseled on diet and exercise.  We will refer her to endocrinology for further evaluation and management  We demonstrated use of NovoLog FlexPen.  Discussed the following recommendations and sliding scale insulin.  Recommendations  Begin Novolog Flex Pen fasting acting meal time insulin.  Begin Novolog 5 units with meals  continue daily Soliqua injection at bedtime  STOP Metformin, STOP Diclofenac   Wean off Clonidine as discussed  I refilled the Silvadene cream for prn use of the skin concern  Begin trial of Ambien 5mg  daily at bedtime  as needed to help with sleep   [place] was seen today for follow-up.  Diagnoses and all orders for this visit:  Uncontrolled diabetes mellitus with complications ([place])  Hyperlipidemia, unspecified hyperlipidemia type  Multinodular goiter  Other orders -     silver sulfADIAZINE (SILVADENE) 1 % cream; Apply 1 application topically daily. -     insulin aspart (NOVOLOG FLEXPEN) 100 UNIT/ML FlexPen; Inject 5 Units into the skin 3 (three) times daily with meals. -     Insulin Pen Needle (BD PEN NEEDLE NANO U/F) 32G X 4 MM MISC; 1 each by Does not apply route at bedtime.

## 2017-10-04 NOTE — Patient Instructions (Addendum)
Recommendations  Begin Novolog Flex Pen fasting acting meal time insulin.  Begin Novolog 5 units with meals  continue daily Soliqua injection at bedtime  STOP Metformin, STOP Diclofenac   Wean off Clonidine as discussed  I refilled the Silvadene cream  Begin trial of Ambien 5mg  daily at bedtime as needed to help with sleep    Correction Insulin/Sliding Scale Your caregiver has decided you need insulin at home. You have been given a correctional scale (sliding scale) in case you need extra insulin when your blood sugar is too high (hyperglycemia). The following instructions will assist you in how to use that correctional scale.  WHAT IS A CORRECTIONAL SCALE (SLIDING SCALE)?  When you check your blood sugar, sometimes it will be higher than your caregiver wants it to be. You may need an extra dose of insulin to bring your blood sugar to your desired level (also known as your goal, target level, or normal level.) The correctional scale is prescribed by your caregiver based on your specific needs.   ______________________________________________________________________  INSULIN SLIDING SCALE   Use the chart below to determine the amount of your Novolog Insulin that you will use to control your meal time blood sugar.  If your glucose before meal is less than 60, drink 4 oz of orange juice or if able, eat a piece of candy and do not use the meal time dose of insulin  If your glucose before meal is 60 -100, don't use the meal time insulin for this meal If your glucose before meal is 101-150, use  2  units of Insulin  If your glucose before meal is 151-200, use  5  units of Insulin  If your glucose before meal is 201-250, use  8  units of Insulin If your glucose before meal is 251-300, use  11  units of Insulin If your glucose before meal is 301-350, use  14  units of Insulin If your glucose before meal is 351-400, use  17  units of Insulin If your glucose before meal is 451-500,  use  20  units of Insulin If your glucose before meal is >500, use 25 units of Insulin and call doctor immediately  ________________________________________________________________________    WHY IS IT IMPORTANT TO KEEP YOUR BLOOD SUGAR LEVELS AT YOUR DESIRED LEVEL?  It helps to prevent long-term complications of diabetes, such as eye disease, kidney failure, and other serious complications. WHAT TYPE OF INSULIN WILL YOU USE?  To help bring down blood sugars that are too high, your caregiver has prescribed a short-acting or a rapid-acting insulin. An example of a short-acting insulin would be Regular.  WHAT DO I NEED TO DO?   Check your blood sugar with your home blood glucose meter as recommended by your caregiver.  Using your correctional scale, find the range your blood sugar lies in.  Look for the units of insulin that matches the blood sugar range. Give yourself the dose of correctional insulin your caregiver has prescribed. Always make sure you are using the right type of insulin.  Prior to the injection make sure you have food available that you can eat in the next 15 to 30 minutes.  If your correctional insulin is rapid acting, start eating your meal within 15 minutes after you have given yourself the insulin injection. If you wait longer than 15 minutes to eat, your blood sugar might get too low.  If your correctional insulin is short acting (Regular), start eating your meal within 30  minutes after you have given yourself the insulin injection. If you wait longer than 30 minutes to eat, your blood sugar might get too low. Symptoms of low blood sugar (hypoglycemia) may include feeling shaky or weak, sweating a lot, not thinking straight, difficulty seeing, agitation, or crankiness. Check your blood sugar immediately and treat your results as directed by your caregiver.  Keep a log of your blood sugar results with the time you took the test and the amount of insulin that you  injected. This information will help your caregiver manage your medications.  Note on your log anything that may affect your blood sugars such as:  Changes in normal exercise or activity.  Changes in your normal schedule, such as staying up late, going on vacation, changing your diet, or holidays.  New medications. This includes all medications. Some medications, even those that do not require a prescription, may cause high blood sugars.  Illness or stress.  Changes in when you actually took your medication.  Changes in your meals, such as skipping a meal, a late meal, or dining out.  Eating things that may affect blood glucose, such as snacks, larger meal portions than normal, or drinks with sugar.  Ask your caregiver any questions you have.

## 2017-10-04 NOTE — Progress Notes (Signed)
Has seen Dr. Cruzita Lederer 2 years ago regarding thyroid issues and she notes at that time her sugars were fine.

## 2017-10-07 ENCOUNTER — Other Ambulatory Visit: Payer: Self-pay | Admitting: Medical

## 2017-10-07 ENCOUNTER — Ambulatory Visit (INDEPENDENT_AMBULATORY_CARE_PROVIDER_SITE_OTHER): Payer: BC Managed Care – PPO | Admitting: *Deleted

## 2017-10-07 ENCOUNTER — Telehealth: Payer: Self-pay | Admitting: Family Medicine

## 2017-10-07 DIAGNOSIS — R55 Syncope and collapse: Secondary | ICD-10-CM | POA: Diagnosis not present

## 2017-10-07 MED ORDER — ZOLPIDEM TARTRATE 5 MG PO TABS: 5.0000 mg | ORAL_TABLET | Freq: Every evening | ORAL | 0 refills | Status: DC | PRN

## 2017-10-07 NOTE — Telephone Encounter (Signed)
Pt called to say the Lorrin Mais was not called into [facility] on [address].

## 2017-10-08 NOTE — Progress Notes (Signed)
[facility] Summary Report / Loop Recorder 

## 2017-10-09 ENCOUNTER — Telehealth: Payer: Self-pay

## 2017-10-09 ENCOUNTER — Other Ambulatory Visit: Payer: Self-pay

## 2017-10-09 DIAGNOSIS — E118 Type 2 diabetes mellitus with unspecified complications: Principal | ICD-10-CM

## 2017-10-09 DIAGNOSIS — IMO0002 Reserved for concepts with insufficient information to code with codable children: Secondary | ICD-10-CM

## 2017-10-09 DIAGNOSIS — E1165 Type 2 diabetes mellitus with hyperglycemia: Secondary | ICD-10-CM

## 2017-10-09 MED ORDER — INSULIN ASPART 100 UNIT/ML FLEXPEN: 5.0000 [IU] | PEN_INJECTOR | Freq: Three times a day (TID) | SUBCUTANEOUS | 2 refills | Status: DC

## 2017-10-09 NOTE — Telephone Encounter (Signed)
Pt advised. KH 

## 2017-10-09 NOTE — Telephone Encounter (Signed)
Pt wants to know if it is ok for her to get a sample of Novolog. She was given a sample to use until she got her prescription but she is unable to get her prescription because it's $100. Please advise.

## 2017-10-09 NOTE — Telephone Encounter (Signed)
ok 

## 2017-10-15 LAB — CUP PACEART REMOTE DEVICE CHECK
MDC IDC PG IMPLANT DT: 20171120
MDC IDC SESS DTM: 20190626140954

## 2017-10-17 ENCOUNTER — Telehealth: Payer: Self-pay

## 2017-10-17 ENCOUNTER — Other Ambulatory Visit: Payer: Self-pay

## 2017-10-17 DIAGNOSIS — IMO0002 Reserved for concepts with insufficient information to code with codable children: Secondary | ICD-10-CM

## 2017-10-17 DIAGNOSIS — E1165 Type 2 diabetes mellitus with hyperglycemia: Secondary | ICD-10-CM

## 2017-10-17 DIAGNOSIS — E118 Type 2 diabetes mellitus with unspecified complications: Principal | ICD-10-CM

## 2017-10-17 MED ORDER — INSULIN ASPART 100 UNIT/ML FLEXPEN: 5.0000 [IU] | PEN_INJECTOR | Freq: Three times a day (TID) | SUBCUTANEOUS | 2 refills | Status: DC

## 2017-10-17 NOTE — Telephone Encounter (Signed)
Patient came by to get a sample of Novalog and I gave her a sample and called [facility] to make sure that RX says 3 boxes per month so that her insurance will pay.

## 2017-10-18 ENCOUNTER — Other Ambulatory Visit: Payer: Self-pay

## 2017-10-18 ENCOUNTER — Telehealth: Payer: Self-pay

## 2017-10-18 DIAGNOSIS — E1165 Type 2 diabetes mellitus with hyperglycemia: Secondary | ICD-10-CM

## 2017-10-18 DIAGNOSIS — IMO0002 Reserved for concepts with insufficient information to code with codable children: Secondary | ICD-10-CM

## 2017-10-18 DIAGNOSIS — E118 Type 2 diabetes mellitus with unspecified complications: Principal | ICD-10-CM

## 2017-10-18 MED ORDER — INSULIN ASPART 100 UNIT/ML FLEXPEN: PEN_INJECTOR | SUBCUTANEOUS | 2 refills | Status: DC

## 2017-10-18 NOTE — Telephone Encounter (Signed)
Pt called stating that pharmacy has requested that her prescription includes that she is on a sliding scale. Please advise.

## 2017-10-18 NOTE — Telephone Encounter (Signed)
pls let pharmacy know she is on sliding scale for Novolog

## 2017-10-18 NOTE — Telephone Encounter (Signed)
Done

## 2017-10-21 ENCOUNTER — Other Ambulatory Visit: Payer: Self-pay | Admitting: Medical

## 2017-10-21 ENCOUNTER — Telehealth: Payer: Self-pay | Admitting: Medical

## 2017-10-21 DIAGNOSIS — IMO0002 Reserved for concepts with insufficient information to code with codable children: Secondary | ICD-10-CM

## 2017-10-21 DIAGNOSIS — E118 Type 2 diabetes mellitus with unspecified complications: Principal | ICD-10-CM

## 2017-10-21 DIAGNOSIS — E1165 Type 2 diabetes mellitus with hyperglycemia: Secondary | ICD-10-CM

## 2017-10-21 MED ORDER — INSULIN ASPART 100 UNIT/ML FLEXPEN: PEN_INJECTOR | SUBCUTANEOUS | 2 refills | Status: DC

## 2017-10-21 NOTE — Telephone Encounter (Signed)
I thought Cyndi cleared this up with pharmacy Friday.   Please call pharmacy back again to make sure it says what it needs to get it covered by insurance.

## 2017-10-21 NOTE — Telephone Encounter (Signed)
[place] called Re novolog pen  rx must state either max daily dosage or state daily supply

## 2017-10-21 NOTE — Telephone Encounter (Signed)
Pt called prescription needs to say. "30 days per 3 pens. Injections by sliding scale patient is on" please advise.

## 2017-10-22 NOTE — Telephone Encounter (Signed)
This has been taken care of and patient has picked up her medication

## 2017-10-27 ENCOUNTER — Other Ambulatory Visit: Payer: Self-pay | Admitting: Medical

## 2017-10-28 ENCOUNTER — Encounter
Payer: BC Managed Care – PPO | Attending: Physical Medicine & Rehabilitation | Admitting: Physical Medicine & Rehabilitation

## 2017-10-28 ENCOUNTER — Encounter: Payer: Self-pay | Admitting: Physical Medicine & Rehabilitation

## 2017-10-28 ENCOUNTER — Other Ambulatory Visit: Payer: Self-pay

## 2017-10-28 VITALS — BP 138/81 | HR 82 | Ht 63.0 in | Wt 239.8 lb

## 2017-10-28 DIAGNOSIS — I10 Essential (primary) hypertension: Secondary | ICD-10-CM | POA: Diagnosis not present

## 2017-10-28 DIAGNOSIS — Z8249 Family history of ischemic heart disease and other diseases of the circulatory system: Secondary | ICD-10-CM | POA: Diagnosis not present

## 2017-10-28 DIAGNOSIS — G4730 Sleep apnea, unspecified: Secondary | ICD-10-CM | POA: Insufficient documentation

## 2017-10-28 DIAGNOSIS — G894 Chronic pain syndrome: Secondary | ICD-10-CM

## 2017-10-28 DIAGNOSIS — E119 Type 2 diabetes mellitus without complications: Secondary | ICD-10-CM | POA: Diagnosis not present

## 2017-10-28 DIAGNOSIS — Z5181 Encounter for therapeutic drug level monitoring: Secondary | ICD-10-CM

## 2017-10-28 DIAGNOSIS — F418 Other specified anxiety disorders: Secondary | ICD-10-CM | POA: Diagnosis not present

## 2017-10-28 DIAGNOSIS — I471 Supraventricular tachycardia: Secondary | ICD-10-CM | POA: Insufficient documentation

## 2017-10-28 DIAGNOSIS — M25551 Pain in right hip: Secondary | ICD-10-CM | POA: Diagnosis not present

## 2017-10-28 DIAGNOSIS — G8929 Other chronic pain: Secondary | ICD-10-CM | POA: Diagnosis present

## 2017-10-28 DIAGNOSIS — Z809 Family history of malignant neoplasm, unspecified: Secondary | ICD-10-CM | POA: Insufficient documentation

## 2017-10-28 DIAGNOSIS — Z833 Family history of diabetes mellitus: Secondary | ICD-10-CM | POA: Diagnosis not present

## 2017-10-28 DIAGNOSIS — Z79899 Other long term (current) drug therapy: Secondary | ICD-10-CM

## 2017-10-28 DIAGNOSIS — M25552 Pain in left hip: Secondary | ICD-10-CM | POA: Diagnosis not present

## 2017-10-28 DIAGNOSIS — M160 Bilateral primary osteoarthritis of hip: Secondary | ICD-10-CM | POA: Diagnosis not present

## 2017-10-28 MED ORDER — DICLOFENAC SODIUM 50 MG PO TBEC: 50.0000 mg | DELAYED_RELEASE_TABLET | Freq: Two times a day (BID) | ORAL | 3 refills | Status: DC

## 2017-10-28 MED ORDER — HYDROCODONE-ACETAMINOPHEN 10-325 MG PO TABS
1.0000 | ORAL_TABLET | Freq: Four times a day (QID) | ORAL | 0 refills | Status: DC | PRN
Start: 2017-10-28 — End: 2017-12-11

## 2017-10-28 NOTE — Patient Instructions (Signed)
PLEASE FEEL FREE TO CALL OUR OFFICE WITH ANY PROBLEMS OR QUESTIONS ([phone])      

## 2017-10-28 NOTE — Progress Notes (Signed)
Subjective:    Patient ID: [name], female    DOB: 10-05-1960, 57 y.o.   MRN: [id number]  HPI   Mrs [name] is here in follow up of her chronic pain.  She had very good results with the diclofenac.  Apparently it was stopped with an increase in her creatinine.  Patient states that she had continued on the medication until recently.  She was told that her most recent labs were back to within normal limits.  I did not see record of those today.  She stated that the diclofenac made a huge difference in her quality of life and ability to stand and work.  She noticed an immediate difference after she stopped.  She denies any nausea or GI upset.  She also continues on hydrocodone for pain control.  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  Pain Inventory Average Pain 8 Pain Right Now 9 My pain is sharp, dull and aching  In the last 24 hours, has pain interfered with the following? General activity 9 Relation with others 9 Enjoyment of life 9 What TIME of day is your pain at its worst? daytime and evening Sleep (in general) NA  Pain is worse with: some activites Pain improves with: medication Relief from Meds: n/a  Mobility walk without assistance how many minutes can you walk? 20 ability to climb steps?  yes do you drive?  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  no  Physicians involved in your care Any changes since last visit?  no   Family History  Problem Relation Age of Onset  . Hypertension Mother   . Alzheimer's disease Mother   . Diabetes Father   . Breast cancer Sister 38  . Anesthesia problems Neg Hx   . Hypotension Neg Hx   . Malignant hyperthermia Neg Hx   . Pseudochol deficiency Neg Hx   . Colon cancer Neg Hx   . Esophageal cancer Neg Hx   .  Stomach cancer Neg Hx   . Rectal cancer Neg Hx    Social History   Socioeconomic History  . Marital status: Divorced    Spouse name: Not on file  . Number of children: 0  . Years of education: Not on file  . Highest education level: Not on file  Occupational History  . Occupation: TEACHER ASSISTANT  Social Needs  . Financial resource strain: Not on file  . Food insecurity:    Worry: Not on file    Inability: Not on file  . Transportation needs:    Medical: Not on file    Non-medical: Not on file  Tobacco Use  . Smoking status: Never Smoker  . Smokeless tobacco: Never Used  Substance and Sexual Activity  . Alcohol use: No  . Drug use: No  . Sexual activity: Yes  Lifestyle  . Physical activity:    Days per week: Not on file    Minutes per session: Not on file  . Stress: Not on file  Relationships  . Social connections:    Talks on phone: Not on file    Gets together: Not on file    Attends religious service: Not on file    Active member of club or organization: Not on file    Attends meetings of clubs or  organizations: Not on file    Relationship status: Not on file  Other Topics Concern  . Not on file  Social History Narrative  . Not on file   Past Surgical History:  Procedure Laterality Date  . ANAL EXAMINATION UNDER ANESTHESIA  02/21/11   anal fistula  . BREAST SURGERY  patient does not remember date of procedure   pull fluid off lft br  . ELECTROPHYSIOLOGIC STUDY N/A 05/05/2015   Procedure: SVT Ablation;  Surgeon: Will Meredith Leeds, MD;  Location: [facility];  Service: Cardiovascular;  Laterality: N/A;  . EP IMPLANTABLE DEVICE N/A 01/30/2016   Procedure: Loop Recorder Insertion;  Surgeon: Sanda Klein, MD;  Location: [facility];  Service: Cardiovascular;  Laterality: N/A;  . INCISE AND DRAIN ABCESS     abscess on right thigh and buttock  . KNEE ARTHROSCOPY     left  . SHOULDER SURGERY  04/14/09   right   Past Medical History:    Diagnosis Date  . Abscess    increased drainage from abscess on buttock  . Anal fistula   . Anxiety   . Bilateral hip pain 05/27/2015  . Chronic pain syndrome 05/27/2015  . Depression    sees [name]  . Diabetes mellitus without complication ([place])   . Hypertension   . Sleep apnea    2008- sleep study, neg. for sleep apnea   . SVT (supraventricular tachycardia) (HCC)    BP 138/81   Pulse 82   Ht 5\' 3"  (1.6 m)   Wt 239 lb 12.8 oz (108.8 kg)   LMP 02/09/2017   SpO2 94%   BMI 42.48 kg/m   Opioid Risk Score:   Fall Risk Score:  `1  Depression screen PHQ 2/9  Depression screen [facility] 2/9 10/28/2017 10/02/2017 07/15/2017 05/23/2017 04/12/2017 03/21/2017 12/24/2016  Decreased Interest 0 0 0 1 3 0 1  Down, Depressed, Hopeless 0 3 0 1 2 0 1  PHQ - 2 Score 0 3 0 2 5 0 2  Altered sleeping - 1 - 0 3 - -  Tired, decreased energy - 0 - 1 3 - -  Change in appetite - 1 - 0 2 - -  Feeling bad or failure about yourself  - 0 - 0 1 - -  Trouble concentrating - 0 - 0 1 - -  Moving slowly or fidgety/restless - 0 - 0 0 - -  Suicidal thoughts - 0 - 0 0 - -  PHQ-9 Score - 5 - 3 15 - -  Difficult doing work/chores - Somewhat difficult - - - - -  Some recent data might be hidden   Review of Systems  Constitutional: Negative.   HENT: Negative.   Eyes: Negative.   Respiratory: Negative.   Cardiovascular: Negative.   Gastrointestinal: Negative.   Endocrine: Negative.   Genitourinary: Negative.   Musculoskeletal: Positive for gait problem.  Skin: Negative.   Allergic/Immunologic: Negative.   Neurological: Positive for numbness.  Hematological: Negative.   Psychiatric/Behavioral: Negative.   All other systems reviewed and are negative.      Objective:   Physical Exam General: No acute distress HEENT: EOMI, oral membranes moist Cards: reg rate  Chest: normal effort Abdomen: Soft, NT, ND Skin: dry, intact Extremities: no edema   MSC: Low back tender to palpation.  Gait antalgic left  more than right   neuro: CN II-XII grossly intact.  Sensation intact to light touch in all LE dermatomes Reflexes 1+ in  b/l LE Strength is5/5 in all 4 limbs  Skin: Warm and Dry  Assessment & Plan:  57 y/o female with pmh of SVT, DM, HTN, anxiety/depression and psh of left knee scope and right shoulder scope presents in b/l hip pain, L>R.   1. 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 -We will continue the controlled substance monitoring program, this consists of regular clinic visits, examinations, routine drug screening, pill counts as well as use of [place] Controlled Substance Reporting System. [place] was reviewed today.   -UDS today   -Continue extra Tylenol within recommended limitations -trial of baclofenfor muscle spasms -weight loss as below -Continue low-dose diclofenac twice daily as this had made a big quality of life difference for her   -she will use twice daily during the week and as needed on weekend .   -will move to prn use ultimately.       -check BMET again in late September/next visit -Provided greater trochanter stretches/iliotibial band stretches today.       -             -Advised her to use ample ice, 3-4 times daily to each hip.             -Consider hip injections if the above prove unsuccessful. 2. GERD---protonix -senokot s for constipation. 3. Morbid obesity Continuesworking on weight loss with improved diet.    Deborra Medina me or nurse practitioner in about 2 weeks. About 15 minutes of face to face patient care time were spent during this visit. All questions were encouraged and answered.

## 2017-10-29 ENCOUNTER — Telehealth: Payer: Self-pay | Admitting: Endocrinology

## 2017-10-29 NOTE — Telephone Encounter (Signed)
LM for pt to call back to schedule a NP appt

## 2017-10-30 ENCOUNTER — Ambulatory Visit: Payer: BC Managed Care – PPO | Admitting: Medical

## 2017-10-30 VITALS — BP 120/70 | HR 82 | Temp 98.0°F | Resp 16 | Ht 64.0 in | Wt 242.8 lb

## 2017-10-30 DIAGNOSIS — I471 Supraventricular tachycardia, unspecified: Secondary | ICD-10-CM

## 2017-10-30 DIAGNOSIS — E118 Type 2 diabetes mellitus with unspecified complications: Secondary | ICD-10-CM

## 2017-10-30 DIAGNOSIS — F410 Panic disorder [episodic paroxysmal anxiety] without agoraphobia: Secondary | ICD-10-CM | POA: Diagnosis not present

## 2017-10-30 DIAGNOSIS — IMO0002 Reserved for concepts with insufficient information to code with codable children: Secondary | ICD-10-CM

## 2017-10-30 DIAGNOSIS — R0602 Shortness of breath: Secondary | ICD-10-CM

## 2017-10-30 DIAGNOSIS — E1165 Type 2 diabetes mellitus with hyperglycemia: Secondary | ICD-10-CM

## 2017-10-30 DIAGNOSIS — F411 Generalized anxiety disorder: Secondary | ICD-10-CM

## 2017-10-30 MED ORDER — INSULIN GLARGINE-LIXISENATIDE 100-33 UNT-MCG/ML ~~LOC~~ SOPN: 60.0000 [IU] | PEN_INJECTOR | Freq: Every day | SUBCUTANEOUS | 2 refills | Status: DC

## 2017-10-30 MED ORDER — INSULIN ASPART 100 UNIT/ML FLEXPEN
PEN_INJECTOR | SUBCUTANEOUS | 2 refills | Status: DC
Start: 2017-10-30 — End: 2017-12-02

## 2017-10-30 MED ORDER — LORAZEPAM 0.5 MG PO TABS: 0.5000 mg | ORAL_TABLET | Freq: Two times a day (BID) | ORAL | 1 refills | Status: DC | PRN

## 2017-10-30 NOTE — Progress Notes (Signed)
Subjective: Chief Complaint  Patient presents with  . anxiety    worrying, sob when going into stores, feels like she is going to passout X 3 weeks   Her sister thinks she has anxiety attacks.  She notes lately feeling more stressed, having episodes of shortness of breath, sweats, shaky feeling, mind racing, and feels this way when she heads into work.  Had an episode Sunday.  She has had a few these episodes recently.  None have been related to activity or exercise.  She does feel quite stressed.  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  She also has not gotten over the death of her mother.  Since last visit the Ambien has been helping with sleep.  She continues to see her counselor/pastor every 2 weeks  Diabetes-recently her blood sugars are running in the 175-260 range.  She is currently using 50 units of Soliqua daily along with a sliding scale insulin.  She has her first appointment with endocrinology in September.  She wants me to call in her NovoLog but change it to 15 p.m. for 30 days per insurance instructions to be able to get 3 boxes.   Past Medical History:  Diagnosis Date  . Abscess    increased drainage from abscess on buttock  . Anal fistula   . Anxiety   . Bilateral hip pain 05/27/2015  . Chronic pain syndrome 05/27/2015  . Depression    sees [name]  . Diabetes mellitus without complication ([place])   . Hypertension   . Sleep apnea    2008- sleep study, neg. for sleep apnea   . SVT (supraventricular tachycardia) ([place])    Current Outpatient Medications on File Prior to Visit  Medication Sig Dispense Refill  . atorvastatin (LIPITOR) 20 MG tablet Take 1 tablet (20 mg total) by mouth at bedtime. 90 tablet 1  . cetirizine (ZYRTEC) 10 MG tablet TAKE 1 TABLET BY MOUTH AT BEDTIME 30 tablet 2  . diclofenac (VOLTAREN) 50 MG EC tablet Take 1  tablet (50 mg total) by mouth 2 (two) times daily. 60 tablet 3  . DULoxetine (CYMBALTA) 60 MG capsule TAKE 1 CAPSULE BY MOUTH TWICE DAILY 60 capsule 0  . fluticasone (FLONASE) 50 MCG/ACT nasal spray Place 2 sprays into both nostrils daily. 16 g 2  . furosemide (LASIX) 40 MG tablet Take 1.5 tablets (60 mg total) by mouth daily. 135 tablet 0  . HYDROcodone-acetaminophen (NORCO) 10-325 MG tablet Take 1 tablet by mouth every 6 (six) hours as needed. 120 tablet 0  . Insulin Pen Needle (BD PEN NEEDLE NANO U/F) 32G X 4 MM MISC 1 each by Does not apply route at bedtime. 100 each 11  . nortriptyline (PAMELOR) 10 MG capsule Take 2 capsules (20 mg total) by mouth at bedtime. 60 capsule 2  . pantoprazole (PROTONIX) 40 MG tablet TAKE 1 TABLET BY MOUTH TWICE DAILY BEFORE MEAL(S) TAKE  30  TO  60  MINUTES  BEFORE  BREAKFAST  AND  DINNER 60 tablet 3  . silver sulfADIAZINE (SILVADENE) 1 % cream Apply 1 application topically daily. 50 g 0  . zolpidem (AMBIEN) 5 MG tablet Take 1 tablet (5 mg total) by mouth at bedtime as needed for sleep. 30 tablet 0   No current facility-administered medications on file prior to visit.    ROS as  in subjective  Objective: BP 120/70   Pulse 82   Temp 98 F (36.7 C) (Oral)   Resp 16   Ht 5\' 4"  (1.626 m)   Wt 242 lb 12.8 oz (110.1 kg)   LMP 02/09/2017   SpO2 98%   BMI 41.68 kg/m    General appearance: alert, no distress, WD/WN,  Neck: supple, no lymphadenopathy, no thyromegaly, no masses Heart: RRR, normal S1, S2, no murmurs Lungs: CTA bilaterally, no wheezes, rhonchi, or rales Abdomen: +bs, soft, non tender, non distended, no masses, no hepatomegaly, no splenomegaly Pulses: 1+ symmetric, upper and lower extremities, normal cap refill    Adult ECG Report  Indication: SOB  Rate: 75 bpm  Rhythm: normal sinus rhythm  QRS Axis: 14 degrees  PR Interval: 134 ms  QRS Duration: 61ms  QTc: 472ms  Conduction Disturbances: none  Other Abnormalities: none  Patient's  cardiac risk factors are: diabetes mellitus, dyslipidemia and obesity (BMI >= 30 kg/m2).  EKG comparison: 05/2017  Narrative Interpretation: no acute changes    Assessment: Encounter Diagnoses  Name Primary?  . Generalized anxiety disorder Yes  . Panic attack   . SOB (shortness of breath)   . Uncontrolled diabetes mellitus with complications ([place])   . 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  Advise she talk to her pastor who is doing counseling with her about ways to deal with panic attack and anxiety, coping skills, stress reduction.  Begin short-term Ativan for panic episode.  Discussed risk and benefits of medication.  Follow-up in 2 weeks  Short of breath-likely related to panic attack, reviewed EKG and compared to old EKG.  Advised that if she continues to have shortness of breath despite use of Ativan or stress reduction and breathing techniques, then f/u with [place] or cardiology ASAP  Diabetes-continue sliding scale insulin, increase Soliqua to 60 units daily, continue plan to follow-up with endocrinology for establish visit   [name] was seen today for anxiety.  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 -     Insulin Glargine-Lixisenatide (SOLIQUA) 100-33 UNT-MCG/ML SOPN; Inject 60 Units as directed at bedtime.

## 2017-10-31 ENCOUNTER — Telehealth: Payer: Self-pay

## 2017-10-31 ENCOUNTER — Other Ambulatory Visit: Payer: Self-pay | Admitting: Medical

## 2017-10-31 MED ORDER — ZOLPIDEM TARTRATE 5 MG PO TABS: ORAL_TABLET | ORAL | 1 refills | Status: DC

## 2017-10-31 NOTE — Telephone Encounter (Signed)
Can you send in [name] into her pharmacy

## 2017-11-01 LAB — TOXASSURE SELECT,+ANTIDEPR,UR

## 2017-11-01 NOTE — Addendum Note (Signed)
Addended by: Carlena Hurl on: 11/01/2017 10:43 AM   Modules accepted: Orders

## 2017-11-04 ENCOUNTER — Other Ambulatory Visit: Payer: Self-pay | Admitting: Physical Medicine & Rehabilitation

## 2017-11-05 ENCOUNTER — Telehealth: Payer: Self-pay | Admitting: *Deleted

## 2017-11-05 NOTE — Telephone Encounter (Signed)
Urine drug screen for this encounter is consistent for prescribed medication 

## 2017-11-12 ENCOUNTER — Ambulatory Visit (INDEPENDENT_AMBULATORY_CARE_PROVIDER_SITE_OTHER): Payer: BC Managed Care – PPO | Admitting: *Deleted

## 2017-11-12 DIAGNOSIS — R55 Syncope and collapse: Secondary | ICD-10-CM | POA: Diagnosis not present

## 2017-11-12 NOTE — Progress Notes (Signed)
[facility] Summary Report / Loop Recorder 

## 2017-11-15 ENCOUNTER — Ambulatory Visit (INDEPENDENT_AMBULATORY_CARE_PROVIDER_SITE_OTHER): Payer: BC Managed Care – PPO | Admitting: Family Medicine

## 2017-11-15 ENCOUNTER — Encounter: Payer: Self-pay | Admitting: Family Medicine

## 2017-11-15 VITALS — BP 126/76 | HR 77 | Temp 98.5°F | Wt 256.2 lb

## 2017-11-15 DIAGNOSIS — R609 Edema, unspecified: Secondary | ICD-10-CM | POA: Diagnosis not present

## 2017-11-15 DIAGNOSIS — I471 Supraventricular tachycardia: Secondary | ICD-10-CM | POA: Diagnosis not present

## 2017-11-15 DIAGNOSIS — Z23 Encounter for immunization: Secondary | ICD-10-CM

## 2017-11-15 MED ORDER — FUROSEMIDE 80 MG PO TABS: 80.0000 mg | ORAL_TABLET | Freq: Every day | ORAL | 3 refills | Status: DC

## 2017-11-15 NOTE — Progress Notes (Signed)
   Subjective:    Patient ID: [name], female    DOB: Jan 10, 1961, 57 y.o.   MRN: [id number]  HPI She notes that over the last several days she has had increased difficulty with leg swelling.  This is worse in the evening and when she wakes up in the morning, the swelling is much less.  No chest pain, shortness of breath, PND.  Does have a previous history of nodal reentry tachycardia with ablation.  Presently she is on 60 mg of Lasix per day.  Review of Systems     Objective:   Physical Exam Alert and in no distress.  Cardiac exam shows regular rhythm without murmurs or gallops.  2-3+ pitting edema is noted in her feet.       Assessment & Plan:  Need for influenza vaccination - Plan: Flu Vaccine QUAD 6+ mos PF IM (Fluarix Quad PF)  AVNRT (AV nodal re-entry tachycardia) ([place]) - Plan: furosemide (LASIX) 80 MG tablet  Dependent edema - Plan: furosemide (LASIX) 80 MG tablet I will increase her Lasix to 80 mg.  She will follow-up here in several months on this and her regular diabetes check.

## 2017-11-15 NOTE — Patient Instructions (Addendum)
Keep your legs elevated as much as possible.  Use support hose as much as you can and use 2 lasix  a day

## 2017-11-18 LAB — CUP PACEART REMOTE DEVICE CHECK
Date Time Interrogation Session: 20190729140916
MDC IDC PG IMPLANT DT: 20171120

## 2017-11-24 ENCOUNTER — Other Ambulatory Visit: Payer: Self-pay | Admitting: Medical

## 2017-11-29 ENCOUNTER — Telehealth: Payer: Self-pay

## 2017-11-29 DIAGNOSIS — R600 Localized edema: Secondary | ICD-10-CM

## 2017-11-29 NOTE — Telephone Encounter (Signed)
Per the pt's my chart message with Dr.C in 9/18. The pt needs an appt and labs. lmtcb

## 2017-12-02 ENCOUNTER — Ambulatory Visit (INDEPENDENT_AMBULATORY_CARE_PROVIDER_SITE_OTHER): Payer: BC Managed Care – PPO | Admitting: Endocrinology

## 2017-12-02 ENCOUNTER — Encounter: Payer: Self-pay | Admitting: Endocrinology

## 2017-12-02 VITALS — BP 132/68 | HR 78 | Ht 63.0 in | Wt 250.0 lb

## 2017-12-02 DIAGNOSIS — E118 Type 2 diabetes mellitus with unspecified complications: Secondary | ICD-10-CM | POA: Diagnosis not present

## 2017-12-02 DIAGNOSIS — IMO0002 Reserved for concepts with insufficient information to code with codable children: Secondary | ICD-10-CM

## 2017-12-02 DIAGNOSIS — E1165 Type 2 diabetes mellitus with hyperglycemia: Secondary | ICD-10-CM

## 2017-12-02 LAB — POCT GLYCOSYLATED HEMOGLOBIN (HGB A1C): Hemoglobin A1C: 13.3 % — AB (ref 4.0–5.6)

## 2017-12-02 MED ORDER — INSULIN GLARGINE 100 UNIT/ML SOLOSTAR PEN: 90.0000 [IU] | PEN_INJECTOR | SUBCUTANEOUS | 99 refills | Status: DC

## 2017-12-02 MED ORDER — SEMAGLUTIDE (1 MG/DOSE) 2 MG/1.5ML ~~LOC~~ SOPN: 1.0000 mg | PEN_INJECTOR | SUBCUTANEOUS | 11 refills | Status: DC

## 2017-12-02 NOTE — Patient Instructions (Addendum)
We'll recheck the thyroid in the future.  most of the time, a "lumpy thyroid" will eventually become overactive.  this is usually a slow process, happening over the span of many years.  good diet and exercise significantly improve the control of your diabetes.  please let me know if you wish to be referred to a dietician.  high blood sugar is very risky to your health.  you should see an eye doctor and dentist every year.  It is very important to get all recommended vaccinations.  Controlling your blood pressure and cholesterol drastically reduces the damage diabetes does to your body.  Those who smoke should quit.  Please discuss these with your doctor.  check your blood sugar 4 times a day.  vary the time of day when you check, between before the 3 meals, and at bedtime.  also check if you have symptoms of your blood sugar being too high or too low.  please keep a record of the readings and bring it to your next appointment here (or you can bring the meter itself).  You can write it on any piece of paper.  please call us sooner if your blood sugar goes below 70, or if you have a lot of readings over 200.  I have sent a prescription to your pharmacy, to change the soliqua to 2 different injections. Please call or message [place] next week, to tell us how the blood sugar is doing. Please come back for a follow-up appointment in 2 months.    Bariatric Surgery You have so much to gain by losing weight.  You may have already tried every diet and exercise plan imaginable.  And, you may have sought advice from your family physician, too.   Sometimes, in spite of such diligent efforts, you may not be able to achieve long-term results by yourself.  In cases of severe obesity, bariatric or weight loss surgery is a proven method of achieving long-term weight control.  Our Services Our bariatric surgery programs offer our patients new hope and long-term weight-loss solution.  Since introducing our services in 2003,  we have conducted more than 2,400 successful procedures.  Our program is designated as a Programmer, multimedia by the Metabolic and Bariatric Surgery Accreditation and Quality Improvement Program (MBSAQIP), a IT trainer that sets rigorous patient safety and outcome standards.  Our program is also designated as a Ecologist by [employer].   Our exceptional weight-loss surgery team specializes in diagnosis, treatment, follow-up care, and ongoing support for our patients with severe weight loss challenges.  We currently offer laparoscopic sleeve gastrectomy, gastric bypass, and adjustable gastric band (LAP-BAND).    Attend our [place] Choosing to undergo a bariatric procedure is a big decision, and one that should not be taken lightly.  You now have two options in how you learn about weight-loss surgery - in person or online.  Our objective is to ensure you have all of the information that you need to evaluate the advantages and obligations of this life changing procedure.  Please note that you are not alone in this process, and our experienced team is ready to assist and answer all of your questions.  There are several ways to register for a seminar (either on-line or in person): 1)  Call [phone] 2) Go on-line to [facility] and register for either type of seminar.  [email or web address]

## 2017-12-02 NOTE — Progress Notes (Signed)
Subjective:    Patient ID: [name], female    DOB: 1960-03-16, 57 y.o.   MRN: [id number]  HPI pt is referred by [name], PA, for diabetes.  Pt states DM was dx'ed in 2012; she has mild if any neuropathy of the lower extremities; she has associated renal failure; she has been on insulin since 2016; pt says her diet and exercise are good; she has never had GDM (G0), pancreatitis, pancreatic surgery, severe hypoglycemia or DKA.  She says cbg varies are in the 200's.  She takes soliqua, 60 units/d.    Past Medical History:  Diagnosis Date  . Abscess    increased drainage from abscess on buttock  . Anal fistula   . Anxiety   . Bilateral hip pain 05/27/2015  . Chronic pain syndrome 05/27/2015  . Depression    sees [name]  . Diabetes mellitus without complication ([place])   . Hypertension   . Sleep apnea    2008- sleep study, neg. for sleep apnea   . SVT (supraventricular tachycardia) (HCC)     Past Surgical History:  Procedure Laterality Date  . ANAL EXAMINATION UNDER ANESTHESIA  02/21/11   anal fistula  . BREAST SURGERY  patient does not remember date of procedure   pull fluid off lft br  . ELECTROPHYSIOLOGIC STUDY N/A 05/05/2015   Procedure: SVT Ablation;  Surgeon: Will Meredith Leeds, MD;  Location: [place];  Service: Cardiovascular;  Laterality: N/A;  . EP IMPLANTABLE DEVICE N/A 01/30/2016   Procedure: Loop Recorder Insertion;  Surgeon: Sanda Klein, MD;  Location: [facility];  Service: Cardiovascular;  Laterality: N/A;  . INCISE AND DRAIN ABCESS     abscess on right thigh and buttock  . KNEE ARTHROSCOPY     left  . SHOULDER SURGERY  04/14/09   right    Social History   Socioeconomic History  . Marital status: Divorced    Spouse name: Not on file  . Number of children: 0  . Years of education: Not on file  . Highest education level: Not on file  Occupational History  . Occupation: TEACHER ASSISTANT  Social Needs  . Financial  resource strain: Not on file  . Food insecurity:    Worry: Not on file    Inability: Not on file  . Transportation needs:    Medical: Not on file    Non-medical: Not on file  Tobacco Use  . Smoking status: Never Smoker  . Smokeless tobacco: Never Used  Substance and Sexual Activity  . Alcohol use: No  . Drug use: No  . Sexual activity: Yes  Lifestyle  . Physical activity:    Days per week: Not on file    Minutes per session: Not on file  . Stress: Not on file  Relationships  . Social connections:    Talks on phone: Not on file    Gets together: Not on file    Attends religious service: Not on file    Active member of club or organization: Not on file    Attends meetings of clubs or organizations: Not on file    Relationship status: Not on file  . Intimate partner violence:    Fear of current or ex partner: Not on file    Emotionally abused: Not on file    Physically abused: Not on file    Forced sexual activity: Not on file  Other Topics Concern  . Not on file  Social History  Narrative  . Not on file    Current Outpatient Medications on File Prior to Visit  Medication Sig Dispense Refill  . atorvastatin (LIPITOR) 20 MG tablet Take 1 tablet (20 mg total) by mouth at bedtime. 90 tablet 1  . diclofenac (VOLTAREN) 50 MG EC tablet Take 1 tablet (50 mg total) by mouth 2 (two) times daily. 60 tablet 3  . DULoxetine (CYMBALTA) 60 MG capsule TAKE 1 CAPSULE BY MOUTH TWICE DAILY 60 capsule 0  . furosemide (LASIX) 80 MG tablet Take 1 tablet (80 mg total) by mouth daily. 30 tablet 3  . HYDROcodone-acetaminophen (NORCO) 10-325 MG tablet Take 1 tablet by mouth every 6 (six) hours as needed. 120 tablet 0  . LORazepam (ATIVAN) 0.5 MG tablet Take 1 tablet (0.5 mg total) by mouth 2 (two) times daily as needed for anxiety. 30 tablet 1  . nortriptyline (PAMELOR) 10 MG capsule TAKE 2 CAPSULES BY MOUTH AT BEDTIME 60 capsule 2  . pantoprazole (PROTONIX) 40 MG tablet TAKE 1 TABLET BY MOUTH  TWICE DAILY BEFORE MEAL(S) TAKE  30  TO  60  MINUTES  BEFORE  BREAKFAST  AND  DINNER 60 tablet 3  . silver sulfADIAZINE (SILVADENE) 1 % cream Apply 1 application topically daily. 50 g 0  . zolpidem (AMBIEN) 5 MG tablet TAKE 1 TABLET BY MOUTH ONCE DAILY AT BEDTIME AS NEEDED FOR SLEEP 30 tablet 1   No current facility-administered medications on file prior to visit.     Allergies  Allergen Reactions  . Tramadol Nausea Only    Family History  Problem Relation Age of Onset  . Hypertension Mother   . Alzheimer's disease Mother   . Diabetes Father   . Breast cancer Sister 42  . Anesthesia problems Neg Hx   . Hypotension Neg Hx   . Malignant hyperthermia Neg Hx   . Pseudochol deficiency Neg Hx   . Colon cancer Neg Hx   . Esophageal cancer Neg Hx   . Stomach cancer Neg Hx   . Rectal cancer Neg Hx   . Thyroid disease Neg Hx     BP 132/68 (BP Location: Right Arm)   Pulse 78   Ht 5\' 3"  (1.6 m)   Wt 250 lb (113.4 kg)   LMP 02/09/2017   SpO2 97%   BMI 44.29 kg/m     Review of Systems denies blurry vision, headache, chest pain, n/v, urinary frequency, excessive diaphoresis, memory loss, cold intolerance, and easy bruising.  She has weight gain.  She has chronic doe, rhinorrhea, and leg cramps.  Depression is well-controlled     Objective:   Physical Exam VS: see vs page GEN: no distress HEAD: head: no deformity eyes: no periorbital swelling, no proptosis external nose and ears are normal mouth: no lesion seen NECK: supple, thyroid is 5-10 x normal size, with several palpable nodules.  CHEST WALL: no deformity LUNGS: clear to auscultation CV: reg rate and rhythm, no murmur ABD: abdomen is soft, nontender.  no hepatosplenomegaly.  not distended.  no hernia MUSCULOSKELETAL: muscle bulk and strength are grossly normal.  no obvious joint swelling.  gait is normal and steady EXTEMITIES: no deformity.  no ulcer on the feet.  feet are of normal color and temp.  1+ bilat leg  edema PULSES: dorsalis pedis intact bilat.  no carotid bruit NEURO:  cn 2-12 grossly intact.   readily moves all 4's.  sensation is intact to touch on the feet SKIN:  Normal texture and temperature.  No rash or suspicious lesion is visible.   NODES:  None palpable at the neck PSYCH: alert, well-oriented.  Does not appear anxious nor depressed.     Lab Results  Component Value Date   HGBA1C 13.3 (A) 12/02/2017   Lab Results  Component Value Date   TSH 0.896 10/02/2017   T4TOTAL 5.3 10/02/2017   Lab Results  Component Value Date   CREATININE 1.43 (H) 10/02/2017   BUN 21 10/02/2017   NA 134 10/02/2017   K 4.6 10/02/2017   CL 93 (L) 10/02/2017   CO2 24 10/02/2017   I personally reviewed electrocardiogram tracing (10/30/17): Indication: palpitations Impression: NSR.  No MI.  No hypertrophy. Compared to 05/23/17: no significant change.   I have reviewed outside records, and summarized: Pt was noted to have severely elevated a1c, and referred here.  He was also noted to have multinodular goiter.    Thyromegaly with bilateral nodules. None currently meet criteria for biopsy or dedicated imaging follow-up.  2 nodules: Beth cat 1.    Assessment & Plan:  Insulin-requiring type 2 DM, with renal failure: she needs increased rx.  To this end, she should split the soliqua.  Multinodular goiter, new to me: we'll recheck this in the future  Patient Instructions  We'll recheck the thyroid in the future.  most of the time, a "lumpy thyroid" will eventually become overactive.  this is usually a slow process, happening over the span of many years.  good diet and exercise significantly improve the control of your diabetes.  please let me know if you wish to be referred to a dietician.  high blood sugar is very risky to your health.  you should see an eye doctor and dentist every year.  It is very important to get all recommended vaccinations.  Controlling your blood pressure and cholesterol  drastically reduces the damage diabetes does to your body.  Those who smoke should quit.  Please discuss these with your doctor.  check your blood sugar 4 times a day.  vary the time of day when you check, between before the 3 meals, and at bedtime.  also check if you have symptoms of your blood sugar being too high or too low.  please keep a record of the readings and bring it to your next appointment here (or you can bring the meter itself).  You can write it on any piece of paper.  please call us sooner if your blood sugar goes below 70, or if you have a lot of readings over 200.  I have sent a prescription to your pharmacy, to change the soliqua to 2 different injections. Please call or message [place] next week, to tell us how the blood sugar is doing. Please come back for a follow-up appointment in 2 months.    Bariatric Surgery You have so much to gain by losing weight.  You may have already tried every diet and exercise plan imaginable.  And, you may have sought advice from your family physician, too.   Sometimes, in spite of such diligent efforts, you may not be able to achieve long-term results by yourself.  In cases of severe obesity, bariatric or weight loss surgery is a proven method of achieving long-term weight control.  Our Services Our bariatric surgery programs offer our patients new hope and long-term weight-loss solution.  Since introducing our services in 2003, we have conducted more than 2,400 successful procedures.  Our program is designated as a Programmer, multimedia by the  Metabolic and Bariatric Surgery Accreditation and Quality Improvement Program [facility]), a national accrediting body that sets rigorous patient safety and outcome standards.  Our program is also designated as a Ecologist by [employer].   Our exceptional weight-loss surgery team specializes in diagnosis, treatment, follow-up care, and ongoing support for our patients with severe weight loss  challenges.  We currently offer laparoscopic sleeve gastrectomy, gastric bypass, and adjustable gastric band (LAP-BAND).    Attend our [place] Choosing to undergo a bariatric procedure is a big decision, and one that should not be taken lightly.  You now have two options in how you learn about weight-loss surgery - in person or online.  Our objective is to ensure you have all of the information that you need to evaluate the advantages and obligations of this life changing procedure.  Please note that you are not alone in this process, and our experienced team is ready to assist and answer all of your questions.  There are several ways to register for a seminar (either on-line or in person): 1)  Call [phone] 2) Go on-line to [facility] and register for either type of seminar.  [email or web address]

## 2017-12-03 ENCOUNTER — Encounter: Payer: Self-pay | Admitting: Endocrinology

## 2017-12-04 ENCOUNTER — Other Ambulatory Visit: Payer: Self-pay

## 2017-12-04 MED ORDER — BASAGLAR KWIKPEN 100 UNIT/ML ~~LOC~~ SOPN: 90.0000 [IU] | PEN_INJECTOR | Freq: Every morning | SUBCUTANEOUS | 1 refills | Status: DC

## 2017-12-04 NOTE — Addendum Note (Signed)
Addended by: Diana Eves on: 12/04/2017 01:30 PM   Modules accepted: Orders

## 2017-12-04 NOTE — Telephone Encounter (Signed)
Appt sch for 12/09/17 at 3:20p with Dr C. Advised to have labs completed this week so they would available for review. Patient verbalized understanding and agreed with plan.

## 2017-12-06 LAB — BASIC METABOLIC PANEL
BUN/Creatinine Ratio: 18 (ref 9–23)
BUN: 24 mg/dL (ref 6–24)
CALCIUM: 8.8 mg/dL (ref 8.7–10.2)
CHLORIDE: 98 mmol/L (ref 96–106)
CO2: 23 mmol/L (ref 20–29)
Creatinine, Ser: 1.36 mg/dL — ABNORMAL HIGH (ref 0.57–1.00)
GFR calc non Af Amer: 43 mL/min/{1.73_m2} — ABNORMAL LOW (ref >59)
GFR, EST AFRICAN AMERICAN: 50 mL/min/{1.73_m2} — AB (ref >59)
GLUCOSE: 363 mg/dL — AB (ref 65–99)
POTASSIUM: 4.1 mmol/L (ref 3.5–5.2)
Sodium: 135 mmol/L (ref 134–144)

## 2017-12-06 LAB — CUP PACEART REMOTE DEVICE CHECK
Date Time Interrogation Session: 20190831143903
MDC IDC PG IMPLANT DT: 20171120

## 2017-12-09 ENCOUNTER — Other Ambulatory Visit: Payer: Self-pay | Admitting: Medical

## 2017-12-09 ENCOUNTER — Telehealth: Payer: Self-pay

## 2017-12-09 ENCOUNTER — Ambulatory Visit: Payer: BC Managed Care – PPO | Admitting: Cardiovascular Disease

## 2017-12-09 DIAGNOSIS — R7989 Other specified abnormal findings of blood chemistry: Secondary | ICD-10-CM

## 2017-12-09 MED ORDER — NYSTATIN 100000 UNIT/GM EX CREA: 1.0000 "application " | TOPICAL_CREAM | Freq: Two times a day (BID) | CUTANEOUS | 0 refills | Status: DC

## 2017-12-09 NOTE — Telephone Encounter (Signed)
Pt called stating she needs a medication called into the pharmacy for a rash she has under her breast due to sweating, she has tried regular medicaid powder to keep it dry due to pharmacy recommendations but it has not helped. Please advise.

## 2017-12-09 NOTE — Telephone Encounter (Signed)
I sent Nystatin cream to use for the next 1-2 weeks  F/u if not seeing gradual improvement

## 2017-12-11 ENCOUNTER — Encounter
Payer: BC Managed Care – PPO | Attending: Physical Medicine & Rehabilitation | Admitting: Physical Medicine & Rehabilitation

## 2017-12-11 ENCOUNTER — Encounter: Payer: Self-pay | Admitting: Physical Medicine & Rehabilitation

## 2017-12-11 VITALS — BP 121/73 | HR 75 | Resp 16 | Ht 63.0 in | Wt 253.0 lb

## 2017-12-11 DIAGNOSIS — F418 Other specified anxiety disorders: Secondary | ICD-10-CM | POA: Insufficient documentation

## 2017-12-11 DIAGNOSIS — Z809 Family history of malignant neoplasm, unspecified: Secondary | ICD-10-CM | POA: Insufficient documentation

## 2017-12-11 DIAGNOSIS — Z8249 Family history of ischemic heart disease and other diseases of the circulatory system: Secondary | ICD-10-CM | POA: Diagnosis not present

## 2017-12-11 DIAGNOSIS — Z833 Family history of diabetes mellitus: Secondary | ICD-10-CM | POA: Diagnosis not present

## 2017-12-11 DIAGNOSIS — M25551 Pain in right hip: Secondary | ICD-10-CM | POA: Insufficient documentation

## 2017-12-11 DIAGNOSIS — M5416 Radiculopathy, lumbar region: Secondary | ICD-10-CM | POA: Diagnosis not present

## 2017-12-11 DIAGNOSIS — G4730 Sleep apnea, unspecified: Secondary | ICD-10-CM | POA: Insufficient documentation

## 2017-12-11 DIAGNOSIS — G8929 Other chronic pain: Secondary | ICD-10-CM | POA: Insufficient documentation

## 2017-12-11 DIAGNOSIS — I471 Supraventricular tachycardia: Secondary | ICD-10-CM | POA: Diagnosis not present

## 2017-12-11 DIAGNOSIS — G894 Chronic pain syndrome: Secondary | ICD-10-CM | POA: Diagnosis not present

## 2017-12-11 DIAGNOSIS — I10 Essential (primary) hypertension: Secondary | ICD-10-CM | POA: Insufficient documentation

## 2017-12-11 DIAGNOSIS — M25552 Pain in left hip: Secondary | ICD-10-CM | POA: Insufficient documentation

## 2017-12-11 DIAGNOSIS — M160 Bilateral primary osteoarthritis of hip: Secondary | ICD-10-CM | POA: Diagnosis not present

## 2017-12-11 DIAGNOSIS — E119 Type 2 diabetes mellitus without complications: Secondary | ICD-10-CM | POA: Diagnosis not present

## 2017-12-11 MED ORDER — HYDROCODONE-ACETAMINOPHEN 10-325 MG PO TABS: 1.0000 | ORAL_TABLET | Freq: Four times a day (QID) | ORAL | 0 refills | Status: DC | PRN

## 2017-12-11 NOTE — Progress Notes (Signed)
Subjective:    Patient ID: [name], female    DOB: [birth year], 57 y.o.   MRN: [id number]  HPI  [name] is back regarding her chronic pain. She has been following up with cardiology re: fluid overload and they are trying to get her weight down. She still has swelling in her legs and to a lesser extent in her hands.  She is on Lasix daily.  She is now off the diclofenac and baclofen.  With the weight loss she has experienced some pain relief.  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  For pain control she is currently on hydrocodone 10/325 1 every 6 hours as needed.  She is now seeing an endocrinologist and notes that her sugars are under much better control.  Pain Inventory Average Pain 8 Pain Right Now 6 My pain is sharp, dull, tingling and aching  In the last 24 hours, has pain interfered with the following? General activity 10 Relation with others 10 Enjoyment of life 10 What TIME of day is your pain at its worst? daytime, evening Sleep (in general) Fair  Pain is worse with: walking, bending and some activites Pain improves with: rest, heat/ice, therapy/exercise and medication Relief from Meds: 6  Mobility walk without assistance how many minutes can you walk? 20 ability to climb steps?  yes do you drive?  yes Do you have any goals in this area?  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  no  Physicians involved in your care Any changes since last visit?  no   Family History  Problem Relation Age of Onset  . Hypertension Mother   . Alzheimer's disease Mother   . Diabetes Father   . Breast cancer Sister 67  . Anesthesia problems Neg Hx   . Hypotension Neg Hx   . Malignant hyperthermia  Neg Hx   . Pseudochol deficiency Neg Hx   . Colon cancer Neg Hx   . Esophageal cancer Neg Hx   . Stomach cancer Neg Hx   . Rectal cancer Neg Hx   . Thyroid disease Neg Hx    Social History   Socioeconomic History  . Marital status: Divorced    Spouse name: Not on file  . Number of children: 0  . Years of education: Not on file  . Highest education level: Not on file  Occupational History  . Occupation: TEACHER ASSISTANT  Social Needs  . Financial resource strain: Not on file  . Food insecurity:    Worry: Not on file    Inability: Not on file  . Transportation needs:    Medical: Not on file    Non-medical: Not on file  Tobacco Use  . Smoking status: Never Smoker  . Smokeless tobacco: Never Used  Substance and Sexual Activity  . Alcohol use: No  . Drug use: No  . Sexual activity: Yes  Lifestyle  . Physical activity:    Days per week: Not on file    Minutes per session: Not on file  . Stress: Not on file  Relationships  . Social connections:    Talks on phone: Not on file    Gets together: Not on file    Attends religious service: Not on  file    Active member of club or organization: Not on file    Attends meetings of clubs or organizations: Not on file    Relationship status: Not on file  Other Topics Concern  . Not on file  Social History Narrative  . Not on file   Past Surgical History:  Procedure Laterality Date  . ANAL EXAMINATION UNDER ANESTHESIA  02/21/11   anal fistula  . BREAST SURGERY  patient does not remember date of procedure   pull fluid off lft br  . ELECTROPHYSIOLOGIC STUDY N/A 05/05/2015   Procedure: SVT Ablation;  Surgeon: Will Meredith Leeds, MD;  Location: [facility];  Service: Cardiovascular;  Laterality: N/A;  . EP IMPLANTABLE DEVICE N/A 01/30/2016   Procedure: Loop Recorder Insertion;  Surgeon: Sanda Klein, MD;  Location: [facility];  Service: Cardiovascular;  Laterality: N/A;  . INCISE AND DRAIN ABCESS     abscess on  right thigh and buttock  . KNEE ARTHROSCOPY     left  . SHOULDER SURGERY  04/14/09   right   Past Medical History:  Diagnosis Date  . Abscess    increased drainage from abscess on buttock  . Anal fistula   . Anxiety   . Bilateral hip pain 05/27/2015  . Chronic pain syndrome 05/27/2015  . Depression    sees [name]  . Diabetes mellitus without complication ([place])   . Hypertension   . Sleep apnea    2008- sleep study, neg. for sleep apnea   . SVT (supraventricular tachycardia) (HCC)    BP 121/73 (BP Location: Right Arm, Patient Position: Sitting, Cuff Size: Normal)   Pulse 75   Resp 16   Ht 5\' 3"  (1.6 m)   Wt 253 lb (114.8 kg)   LMP 02/09/2017   SpO2 93%   BMI 44.82 kg/m   Opioid Risk Score:   Fall Risk Score:  `1  Depression screen PHQ 2/9  Depression screen [facility] 2/9 10/28/2017 10/02/2017 07/15/2017 05/23/2017 04/12/2017 03/21/2017 12/24/2016  Decreased Interest 0 0 0 1 3 0 1  Down, Depressed, Hopeless 0 3 0 1 2 0 1  PHQ - 2 Score 0 3 0 2 5 0 2  Altered sleeping - 1 - 0 3 - -  Tired, decreased energy - 0 - 1 3 - -  Change in appetite - 1 - 0 2 - -  Feeling bad or failure about yourself  - 0 - 0 1 - -  Trouble concentrating - 0 - 0 1 - -  Moving slowly or fidgety/restless - 0 - 0 0 - -  Suicidal thoughts - 0 - 0 0 - -  PHQ-9 Score - 5 - 3 15 - -  Difficult doing work/chores - Somewhat difficult - - - - -  Some recent data might be hidden    Review of Systems  Constitutional: Negative.   HENT: Negative.   Eyes: Negative.   Respiratory: Negative.   Cardiovascular: Negative.   Genitourinary: Negative.   Musculoskeletal: Positive for arthralgias, back pain and gait problem.  Skin: Negative.   Allergic/Immunologic: Negative.   Neurological: Positive for numbness.       Tingling  Psychiatric/Behavioral: Negative.   All other systems reviewed and are negative.      Objective:   Physical Exam  General: No acute distress. obese HEENT: EOMI, oral membranes  moist Cards: reg rate  Chest: normal effort Abdomen: Soft, NT, ND Skin: dry, intact Extremities: 1++ edema bilateral  LE   MSC: Low back tender to palpation.  Limited flexion and extension.  Mild antalgia left greater than right side. Neurological:Sensation intact to light touch in all LE dermatomes Reflexes 1+ in b/l LE Strength is5/5 in all 4 limbs  Skin: Warm and Dry  Assessment & Plan:    1. 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 -We will continue the controlled substance monitoring program, this consists of regular clinic visits, examinations, routine drug screening, pill counts as well as use of [place] Controlled Substance Reporting System. [place] today.  -Medication was refilled and a second prescription was sent to the patient's pharmacy for next month.       -Continue extra Tylenol within recommended limitations -Consider hip injections if the above prove unsuccessful. 2. GERD---protonix -senokot s for constipation. 3. Morbid obesity Continuesworking on weight loss with improved diet.  -Discussed use of knee-high compression stockings for edema control.  Sip of type may be most efficacious for her.  Volume management per cardiology  .  Followupwith meor nurse practitionerin about 2 weeks. About9minutes of face to face patient care time were spent during this visit. All questions were encouraged and answered.

## 2017-12-11 NOTE — Patient Instructions (Signed)
PLEASE FEEL FREE TO CALL OUR OFFICE WITH ANY PROBLEMS OR QUESTIONS ([phone])      

## 2017-12-11 NOTE — Telephone Encounter (Signed)
lvm informing patient of medication sent. Asked her to call the office if she has any additional questions.

## 2017-12-12 ENCOUNTER — Ambulatory Visit (INDEPENDENT_AMBULATORY_CARE_PROVIDER_SITE_OTHER): Payer: BC Managed Care – PPO | Admitting: *Deleted

## 2017-12-12 DIAGNOSIS — R002 Palpitations: Secondary | ICD-10-CM

## 2017-12-12 DIAGNOSIS — R55 Syncope and collapse: Secondary | ICD-10-CM

## 2017-12-13 NOTE — Progress Notes (Signed)
[facility] Summary Report / Loop Recorder 

## 2017-12-16 LAB — CUP PACEART REMOTE DEVICE CHECK
Implantable Pulse Generator Implant Date: 20171120
MDC IDC SESS DTM: 20191003143623

## 2017-12-23 ENCOUNTER — Other Ambulatory Visit: Payer: Self-pay | Admitting: Medical

## 2017-12-26 ENCOUNTER — Other Ambulatory Visit: Payer: Self-pay | Admitting: Medical

## 2017-12-29 ENCOUNTER — Encounter: Payer: Self-pay | Admitting: Endocrinology

## 2017-12-30 ENCOUNTER — Telehealth: Payer: Self-pay | Admitting: Endocrinology

## 2017-12-30 NOTE — Telephone Encounter (Signed)
This medication is not on pt active medication list, please have her contact pharmacy so they can fax request.

## 2017-12-30 NOTE — Telephone Encounter (Signed)
Per [facility] "Caller needs a refill on her Soliqua insulin pens. She uses 90 units per day. She is out of the medication today." Ph # [phone]

## 2017-12-31 ENCOUNTER — Telehealth: Payer: Self-pay

## 2017-12-31 ENCOUNTER — Other Ambulatory Visit: Payer: Self-pay

## 2017-12-31 MED ORDER — BASAGLAR KWIKPEN 100 UNIT/ML ~~LOC~~ SOPN: 90.0000 [IU] | PEN_INJECTOR | Freq: Every morning | SUBCUTANEOUS | 1 refills | Status: DC

## 2017-12-31 MED ORDER — SEMAGLUTIDE (1 MG/DOSE) 2 MG/1.5ML ~~LOC~~ SOPN: 1.0000 mg | PEN_INJECTOR | SUBCUTANEOUS | 11 refills | Status: DC

## 2017-12-31 NOTE — Telephone Encounter (Signed)
Patient has called to request a refill on Soliqua- this is her second request and she does not have any left-patient would like to know if we have a sample she could pick up to hold her until refill can be filled the pharmacy is stating it will take 2 days to fill- please send to [facility] if appropriate- FYI this medication has not been added to patients med list but Dr. Loanne Drilling has agreed to continue to fill this medication for this patient

## 2017-12-31 NOTE — Telephone Encounter (Signed)
This is a combination of 2 different meds.  We divided it into the 2 different prescriptions (basaglar and ozempic), so we could properly adjust the 2.  Please refill both if pt wishes

## 2017-12-31 NOTE — Telephone Encounter (Signed)
Called pt to inform her that a Rx for Avinger have been sent to her pharmacy for pick up. Pt is not taking Soliqua since it has been divided in to 2 different prescriptions Environmental health practitioner and Ozempic). Pt verbalized acceptance and understanding.

## 2018-01-02 ENCOUNTER — Other Ambulatory Visit: Payer: Self-pay | Admitting: Medical

## 2018-01-02 NOTE — Telephone Encounter (Signed)
Is this okay to refill? 

## 2018-01-14 ENCOUNTER — Ambulatory Visit (INDEPENDENT_AMBULATORY_CARE_PROVIDER_SITE_OTHER): Payer: BC Managed Care – PPO | Admitting: *Deleted

## 2018-01-14 DIAGNOSIS — R55 Syncope and collapse: Secondary | ICD-10-CM | POA: Diagnosis not present

## 2018-01-15 NOTE — Progress Notes (Signed)
[facility] Summary Report / Loop Recorder 

## 2018-01-21 ENCOUNTER — Ambulatory Visit (INDEPENDENT_AMBULATORY_CARE_PROVIDER_SITE_OTHER): Payer: BC Managed Care – PPO | Admitting: Endocrinology

## 2018-01-21 ENCOUNTER — Encounter: Payer: Self-pay | Admitting: Endocrinology

## 2018-01-21 VITALS — BP 130/78 | HR 86 | Ht 63.0 in | Wt 249.2 lb

## 2018-01-21 DIAGNOSIS — E1165 Type 2 diabetes mellitus with hyperglycemia: Secondary | ICD-10-CM

## 2018-01-21 DIAGNOSIS — E118 Type 2 diabetes mellitus with unspecified complications: Secondary | ICD-10-CM

## 2018-01-21 DIAGNOSIS — IMO0002 Reserved for concepts with insufficient information to code with codable children: Secondary | ICD-10-CM

## 2018-01-21 LAB — POCT GLYCOSYLATED HEMOGLOBIN (HGB A1C): HEMOGLOBIN A1C: 8.4 % — AB (ref 4.0–5.6)

## 2018-01-21 MED ORDER — BASAGLAR KWIKPEN 100 UNIT/ML ~~LOC~~ SOPN: 100.0000 [IU] | PEN_INJECTOR | Freq: Every morning | SUBCUTANEOUS | 3 refills | Status: DC

## 2018-01-21 NOTE — Progress Notes (Signed)
Subjective:    Patient ID: [name], female    DOB: [birth year], 57 y.o.   MRN: [id number]  HPI Pt returns for f/u of diabetes mellitus: DM type: Insulin-requiring type 2 Dx'ed: 1572 Complications: renal failure Therapy: insulin since 2016, and Ozempic GDM: never (G0) DKA: never Severe hypoglycemia: never Pancreatitis: never Pancreatic imaging: normal on 2018 CT Other: she is on qd insulin, at least for now Interval history: no cbg record, but states fasting cbg varies from 120-210.  pt states she feels well in general.  She takes meds as rx'ed She also has multinodular goiter (US showed bilateral nodules; none meet criteria for biopsy or follow-up; bx in 2018: bx of 2 nodules: Beth cat 1).   Past Medical History:  Diagnosis Date  . Abscess    increased drainage from abscess on buttock  . Anal fistula   . Anxiety   . Bilateral hip pain 05/27/2015  . Chronic pain syndrome 05/27/2015  . Depression    sees [name]  . Diabetes mellitus without complication ([place])   . Hypertension   . Sleep apnea    2008- sleep study, neg. for sleep apnea   . SVT (supraventricular tachycardia) (HCC)     Past Surgical History:  Procedure Laterality Date  . ANAL EXAMINATION UNDER ANESTHESIA  02/21/11   anal fistula  . BREAST SURGERY  patient does not remember date of procedure   pull fluid off lft br  . ELECTROPHYSIOLOGIC STUDY N/A 05/05/2015   Procedure: SVT Ablation;  Surgeon: Will Meredith Leeds, MD;  Location: [facility];  Service: Cardiovascular;  Laterality: N/A;  . EP IMPLANTABLE DEVICE N/A 01/30/2016   Procedure: Loop Recorder Insertion;  Surgeon: Sanda Klein, MD;  Location: [facility];  Service: Cardiovascular;  Laterality: N/A;  . INCISE AND DRAIN ABCESS     abscess on right thigh and buttock  . KNEE ARTHROSCOPY     left  . SHOULDER SURGERY  04/14/09   right    Social History   Socioeconomic History  . Marital status: Divorced    Spouse name: Not  on file  . Number of children: 0  . Years of education: Not on file  . Highest education level: Not on file  Occupational History  . Occupation: TEACHER ASSISTANT  Social Needs  . Financial resource strain: Not on file  . Food insecurity:    Worry: Not on file    Inability: Not on file  . Transportation needs:    Medical: Not on file    Non-medical: Not on file  Tobacco Use  . Smoking status: Never Smoker  . Smokeless tobacco: Never Used  Substance and Sexual Activity  . Alcohol use: No  . Drug use: No  . Sexual activity: Yes  Lifestyle  . Physical activity:    Days per week: Not on file    Minutes per session: Not on file  . Stress: Not on file  Relationships  . Social connections:    Talks on phone: Not on file    Gets together: Not on file    Attends religious service: Not on file    Active member of club or organization: Not on file    Attends meetings of clubs or organizations: Not on file    Relationship status: Not on file  . Intimate partner violence:    Fear of current or ex partner: Not on file    Emotionally abused: Not on file    Physically  abused: Not on file    Forced sexual activity: Not on file  Other Topics Concern  . Not on file  Social History Narrative  . Not on file    Current Outpatient Medications on File Prior to Visit  Medication Sig Dispense Refill  . atorvastatin (LIPITOR) 20 MG tablet Take 1 tablet (20 mg total) by mouth at bedtime. 90 tablet 1  . DULoxetine (CYMBALTA) 60 MG capsule TAKE 1 CAPSULE BY MOUTH TWICE DAILY 60 capsule 1  . furosemide (LASIX) 80 MG tablet Take 1 tablet (80 mg total) by mouth daily. 30 tablet 3  . HYDROcodone-acetaminophen (NORCO) 10-325 MG tablet Take 1 tablet by mouth every 6 (six) hours as needed. 120 tablet 0  . LORazepam (ATIVAN) 0.5 MG tablet Take 1 tablet (0.5 mg total) by mouth 2 (two) times daily as needed for anxiety. 30 tablet 1  . nortriptyline (PAMELOR) 10 MG capsule TAKE 2 CAPSULES BY MOUTH AT  BEDTIME 60 capsule 2  . Semaglutide, 1 MG/DOSE, (OZEMPIC, 1 MG/DOSE,) 2 MG/1.5ML SOPN Inject 1 mg into the skin once a week. 4 pen 11  . zolpidem (AMBIEN) 5 MG tablet TAKE 1 TABLET BY MOUTH ONCE DAILY AT BEDTIME AS NEEDED FOR SLEEP 30 tablet 1   No current facility-administered medications on file prior to visit.     Allergies  Allergen Reactions  . Tramadol Nausea Only    Family History  Problem Relation Age of Onset  . Hypertension Mother   . Alzheimer's disease Mother   . Diabetes Father   . Breast cancer Sister 60  . Anesthesia problems Neg Hx   . Hypotension Neg Hx   . Malignant hyperthermia Neg Hx   . Pseudochol deficiency Neg Hx   . Colon cancer Neg Hx   . Esophageal cancer Neg Hx   . Stomach cancer Neg Hx   . Rectal cancer Neg Hx   . Thyroid disease Neg Hx     BP 130/78 (BP Location: Left Arm, Patient Position: Sitting, Cuff Size: Large)   Pulse 86   Ht 5\' 3"  (1.6 m)   Wt 249 lb 3.2 oz (113 kg)   LMP 02/09/2017   SpO2 97%   BMI 44.14 kg/m      Review of Systems She denies hypoglycemia    Objective:   Physical Exam VITAL SIGNS:  See vs page GENERAL: no distress Pulses: dorsalis pedis intact bilat.   MSK: no deformity of the feet CV: trace bilat leg edema Skin:  no ulcer on the feet.  normal color and temp on the feet. Neuro: sensation is intact to touch on the feet Ext: There is bilateral onychomycosis of the toenails.   Lab Results  Component Value Date   HGBA1C 8.4 (A) 01/21/2018       Assessment & Plan:  Insulin-requiring type 2 DM, with renal failure.  She needs increased rx.   Patient Instructions  check your blood sugar twice a day.  vary the time of day when you check, between before the 3 meals, and at bedtime.  also check if you have symptoms of your blood sugar being too high or too low.  please keep a record of the readings and bring it to your next appointment here (or you can bring the meter itself).  You can write it on any piece of  paper.  please call us sooner if your blood sugar goes below 70, or if you have a lot of readings over 200.   please  increase the Basaglar to 100 units each morning, and:  Please continue the same Ozempic. Please come back for a follow-up appointment in 2 months.

## 2018-01-21 NOTE — Patient Instructions (Addendum)
check your blood sugar twice a day.  vary the time of day when you check, between before the 3 meals, and at bedtime.  also check if you have symptoms of your blood sugar being too high or too low.  please keep a record of the readings and bring it to your next appointment here (or you can bring the meter itself).  You can write it on any piece of paper.  please call us sooner if your blood sugar goes below 70, or if you have a lot of readings over 200.   please increase the Basaglar to 100 units each morning, and:  Please continue the same Ozempic. Please come back for a follow-up appointment in 2 months.

## 2018-01-22 ENCOUNTER — Other Ambulatory Visit: Payer: Self-pay | Admitting: Internal Medicine

## 2018-01-26 IMAGING — DX DG CHEST 2V
2 series · 2 of 2 positions shown · non-contrast
Comparison: 10/26/2015.

CLINICAL DATA: Palpitations since this morning.  Dizziness.

EXAM:
CHEST  2 VIEW

[w chest pa]
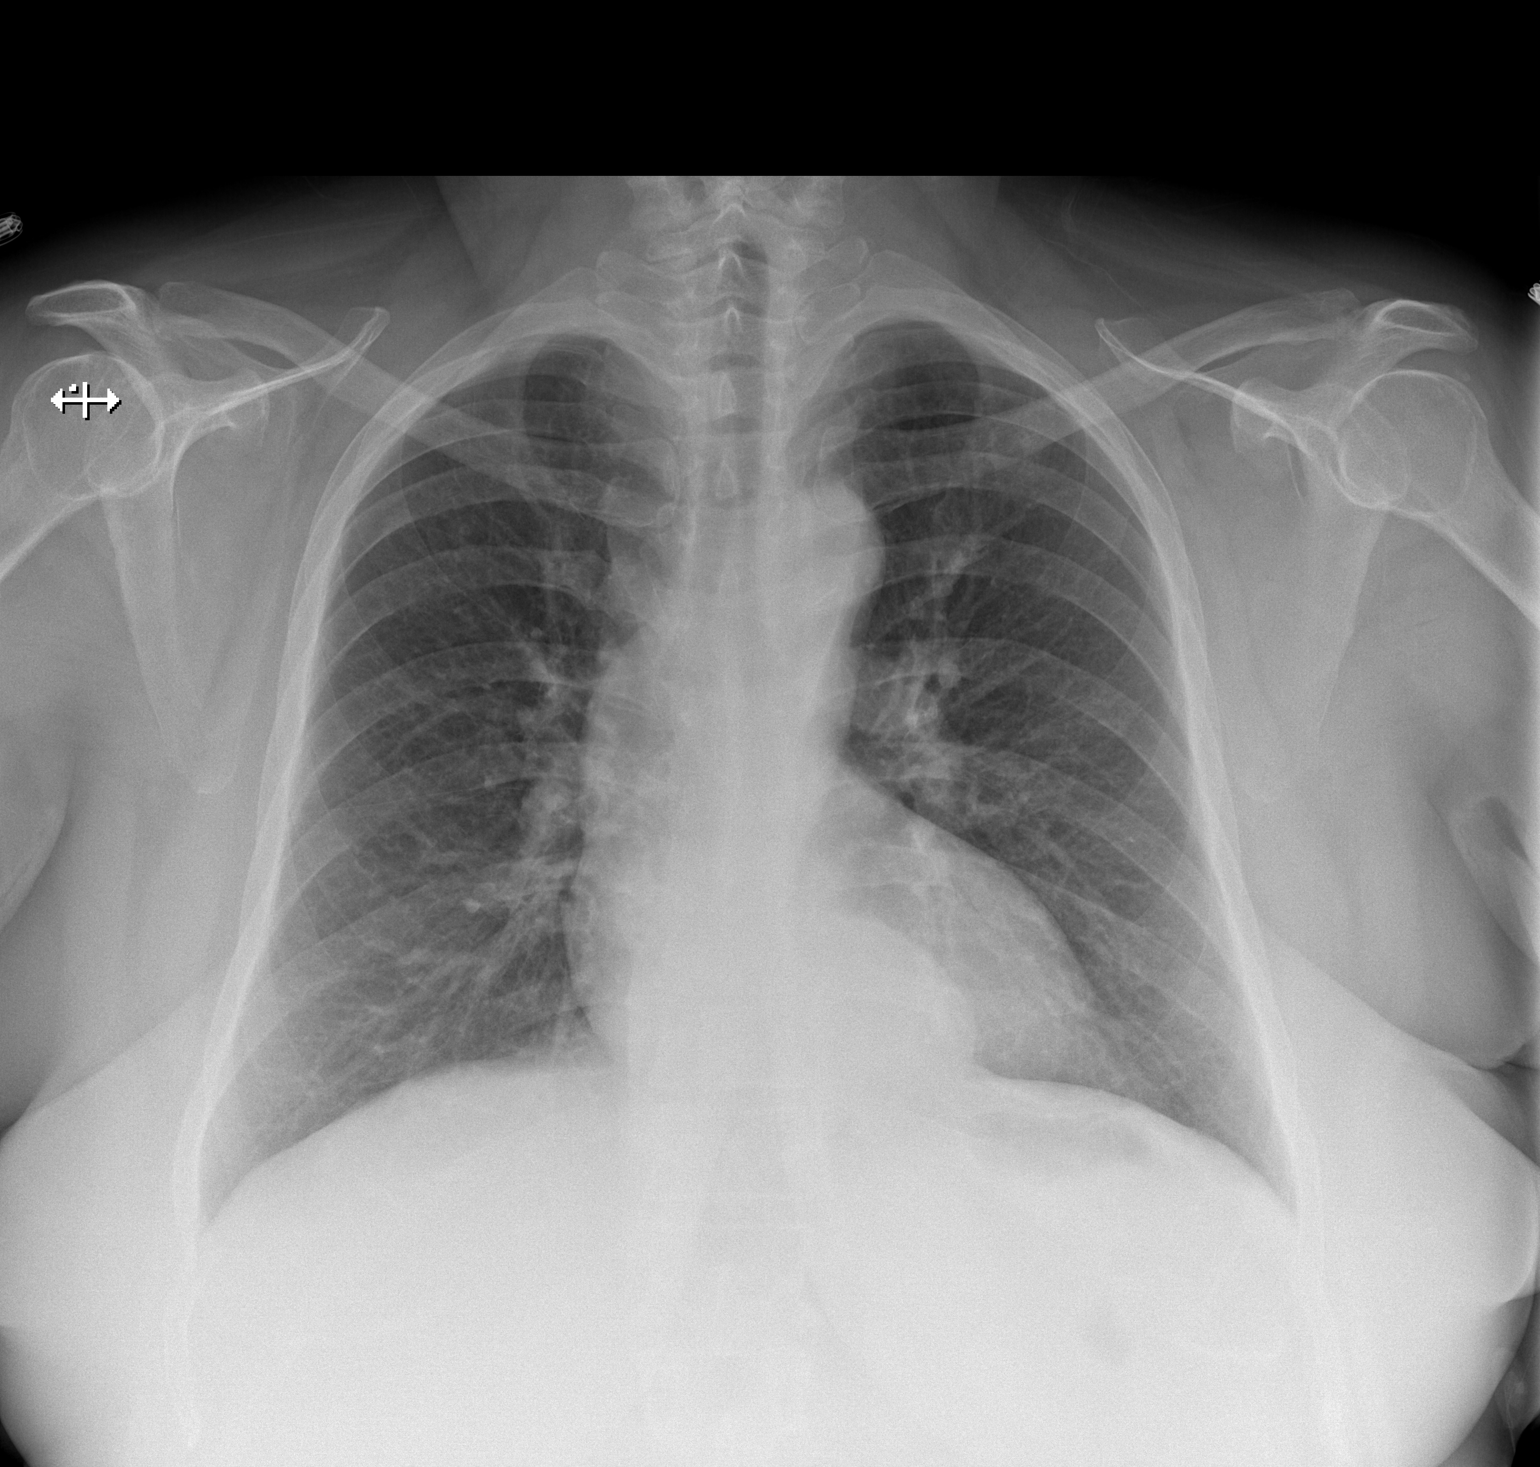

[w chest lat]
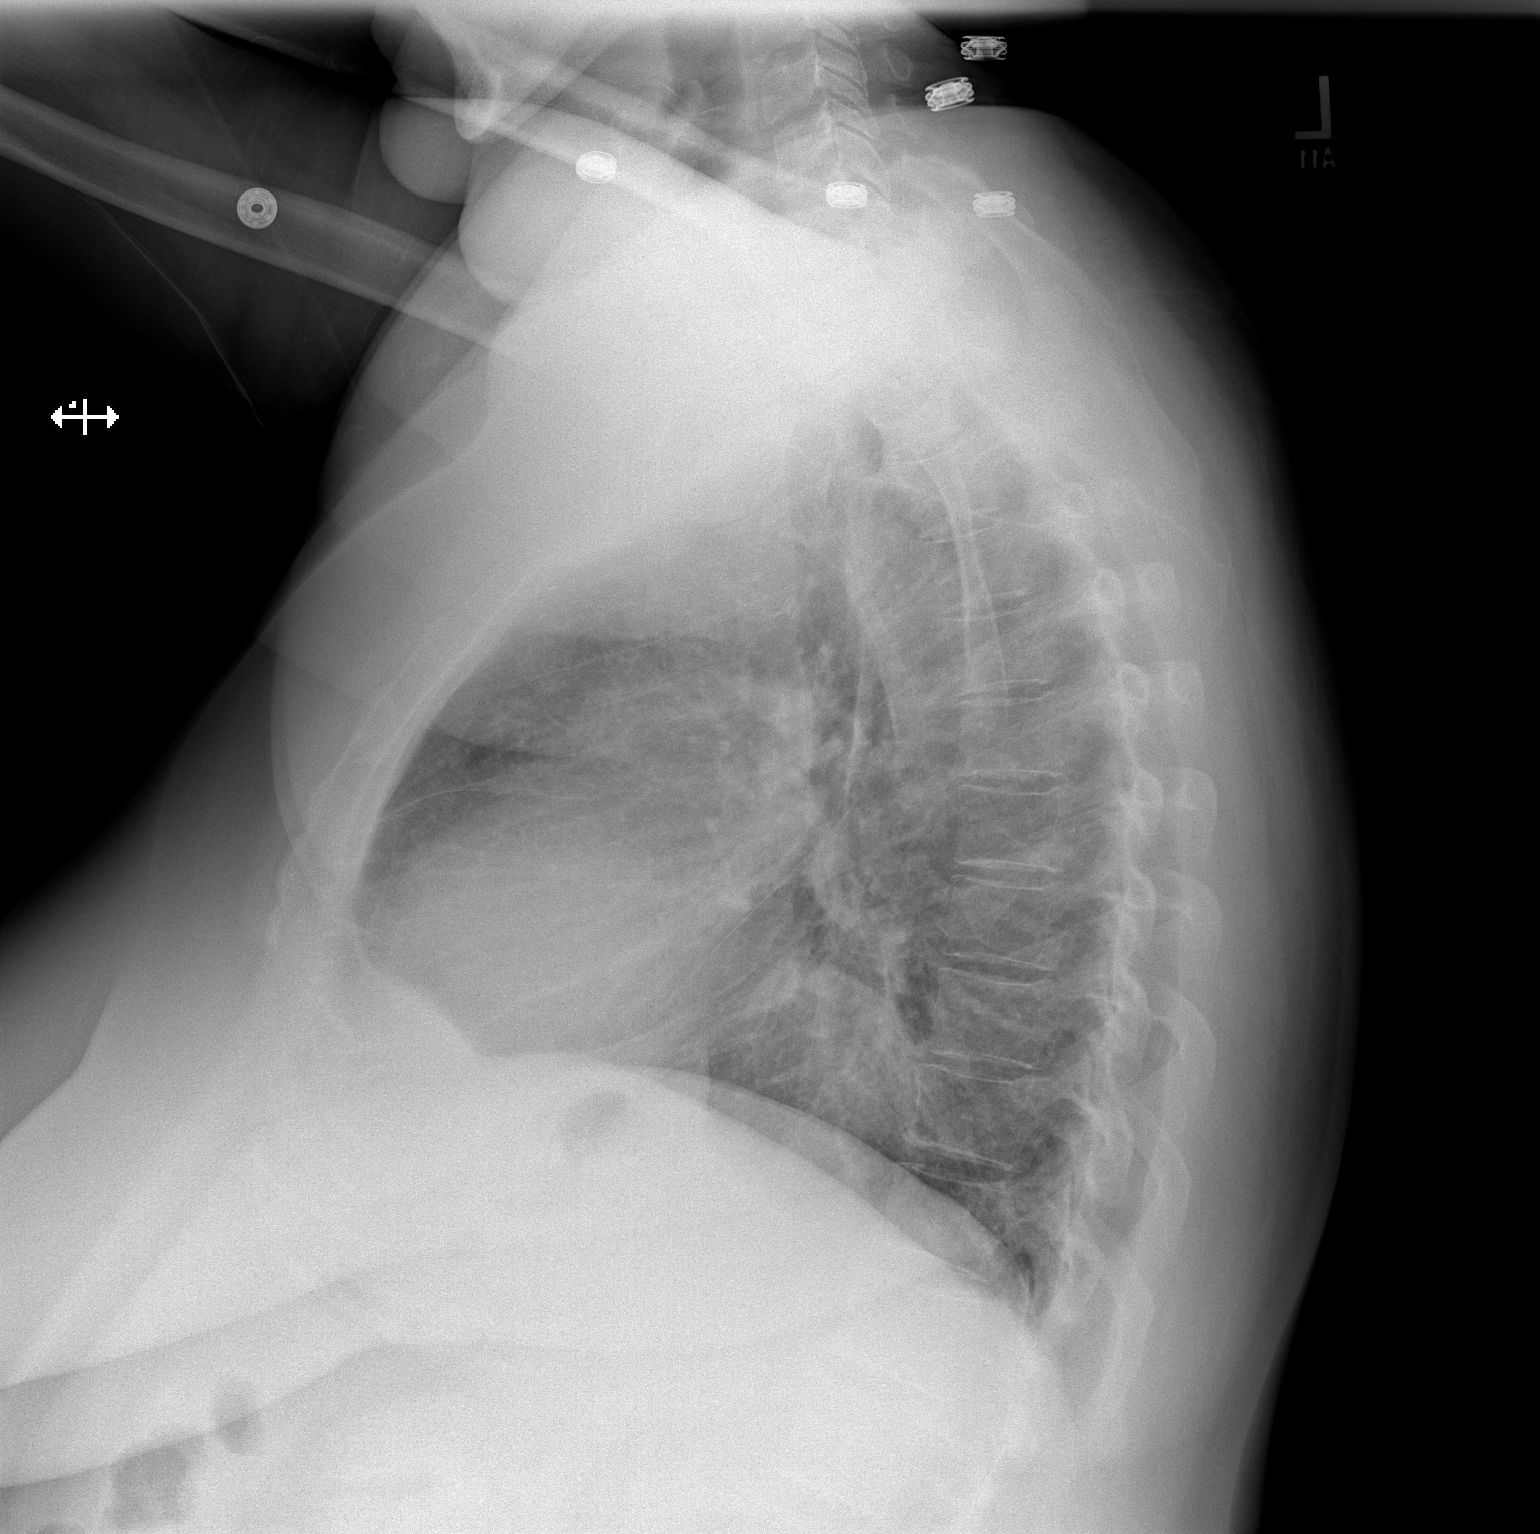

[2 of 2 positions shown; findings below may reference images not displayed]

FINDINGS: Normal sized heart. Clear lungs. Small to moderate-sized hiatal
hernia. Unremarkable bones.
IMPRESSION: No acute abnormality.  Small to moderate-sized hiatal hernia.

## 2018-01-29 ENCOUNTER — Other Ambulatory Visit: Payer: Self-pay | Admitting: Medical

## 2018-01-29 ENCOUNTER — Other Ambulatory Visit: Payer: Self-pay | Admitting: Physical Medicine & Rehabilitation

## 2018-01-30 NOTE — Telephone Encounter (Signed)
Is this okay to refill? 

## 2018-01-31 IMAGING — MR MR HIP*L* W/O CM
4 of 5 series · 19 of 40 positions shown · non-contrast
Comparison: None.

CLINICAL DATA: Bilateral hip pain. Pain with standing and walking
for 1 year.

EXAM:
MR OF THE LEFT HIP WITHOUT CONTRAST
TECHNIQUE: Multiplanar, multisequence MR imaging was performed. No intravenous
contrast was administered.

[Series 4: T1 · axial · 6.0mm · 0.74mm/px · z∈[+10,+227]mm · 6 of 36 slices shown (1 of 2)]
[im 1/36]
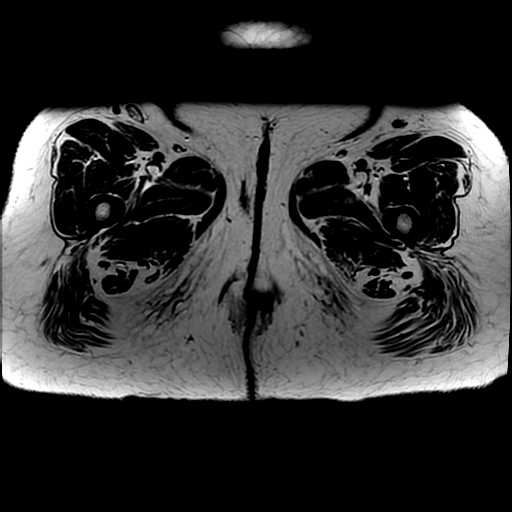
[im 4/36]
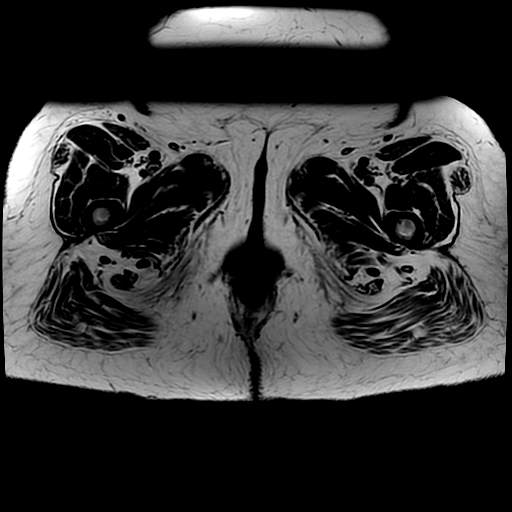
[im 12/36]
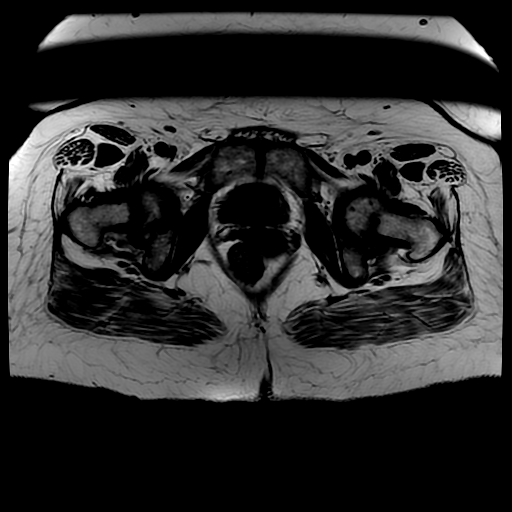
[im 16/36]
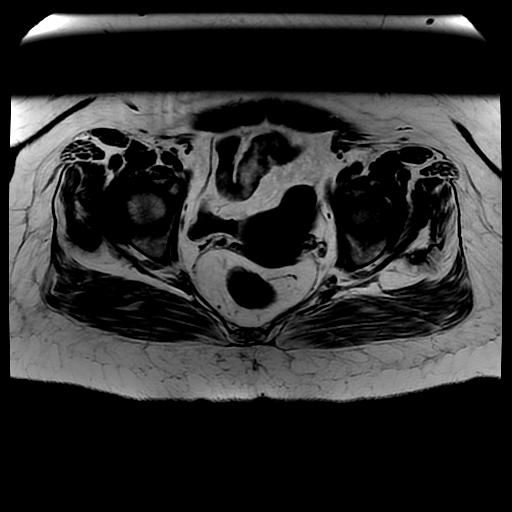
[im 20/36]
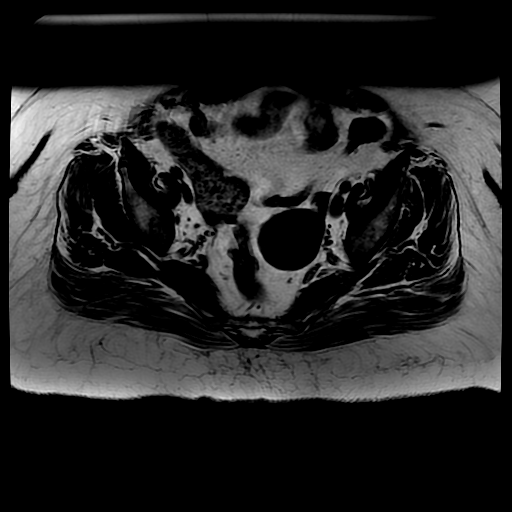
[im 32/36]
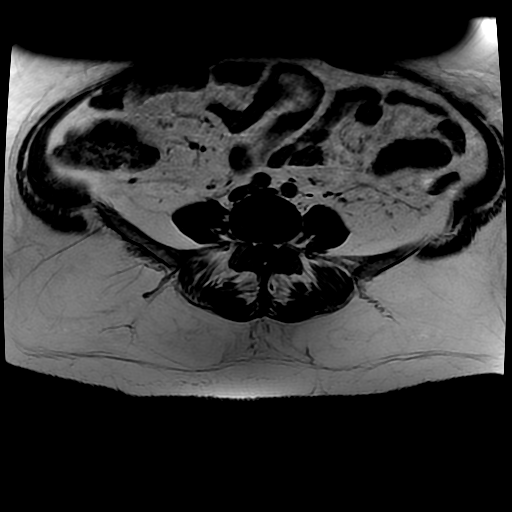

[Series 5: T2 · axial · 6.0mm · 0.74mm/px · z∈[+38,+220]mm · 3 of 36 slices shown]
[im 5/36]
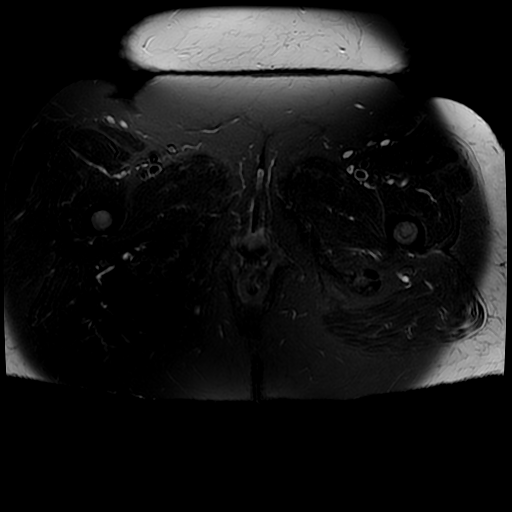
[im 18/36]
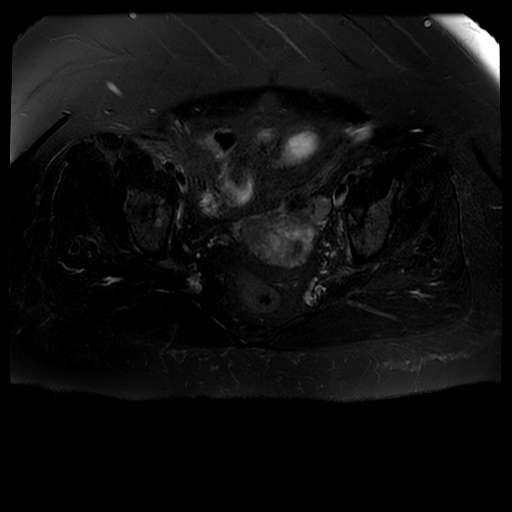
[im 31/36]
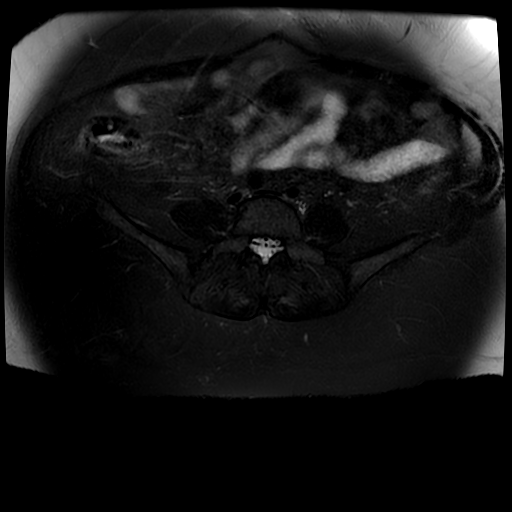

[Series 6: T1 · coronal · 5.0mm · 0.78mm/px · 3 of 29 slices shown (2 of 2)]
[im 5/29]
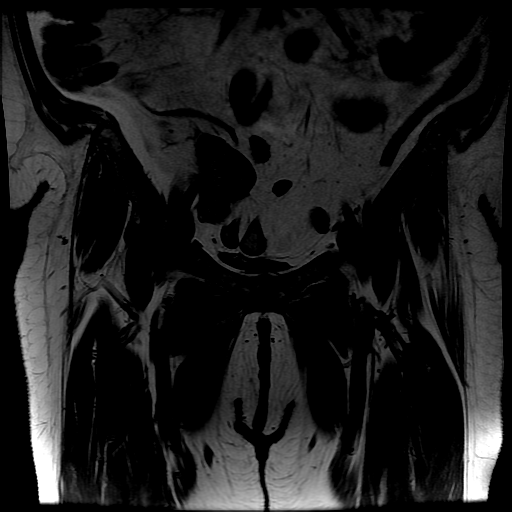
[im 15/29]
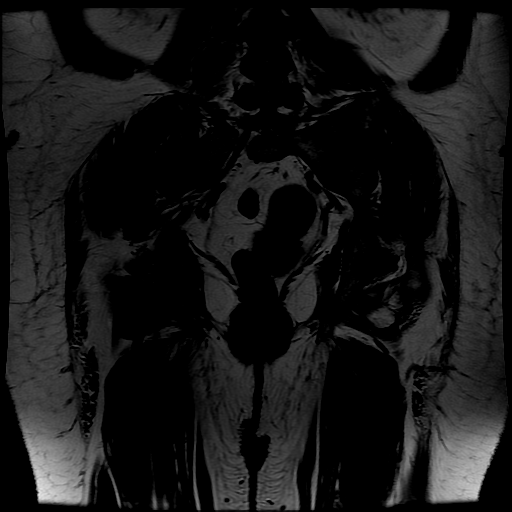
[im 24/29]
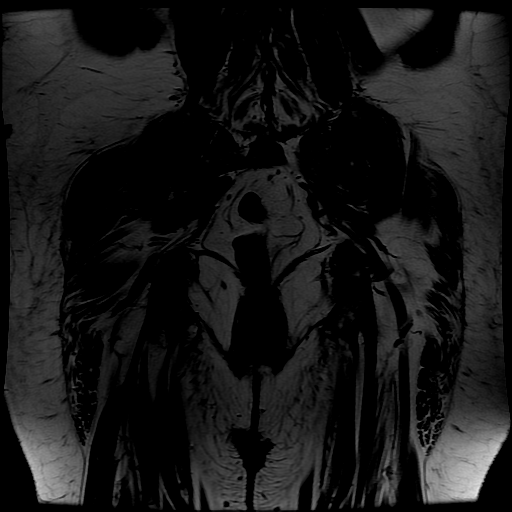

[Series 8: PD · sagittal · 4.0mm · 0.27mm/px · 7 of 26 slices shown]
[im 1/26]
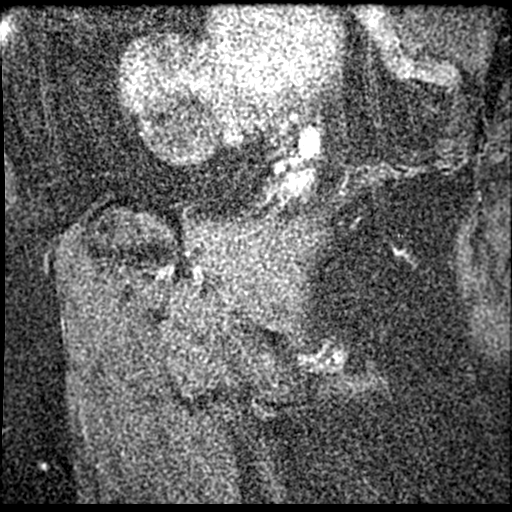
[im 5/26]
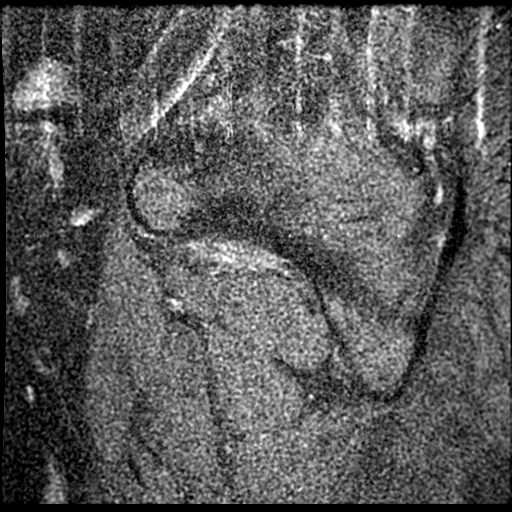
[im 9/26]
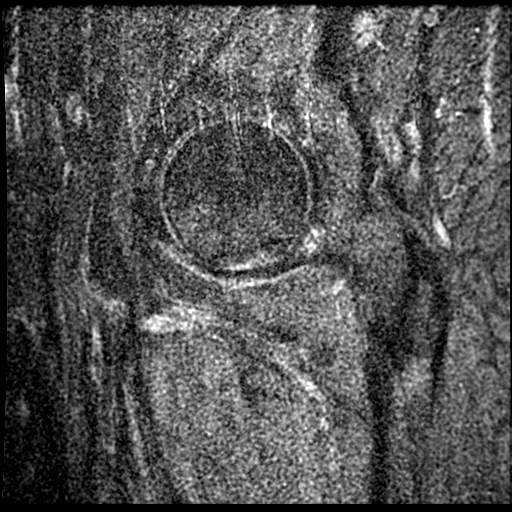
[im 13/26]
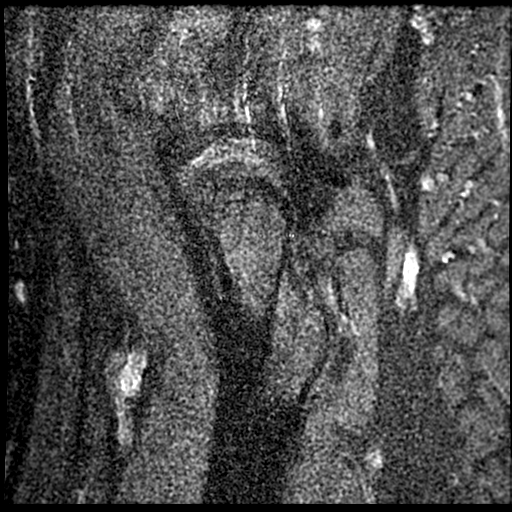
[im 17/26]
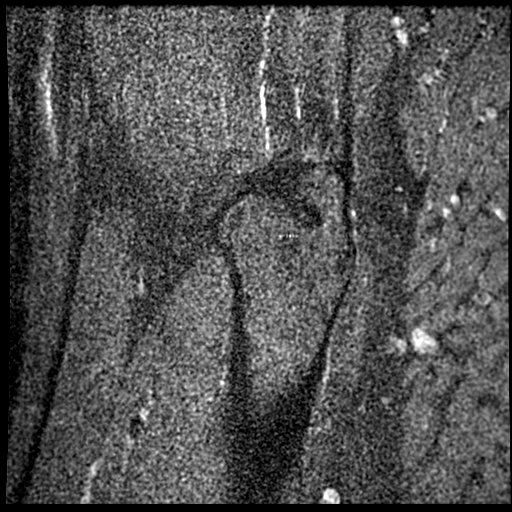
[im 21/26]
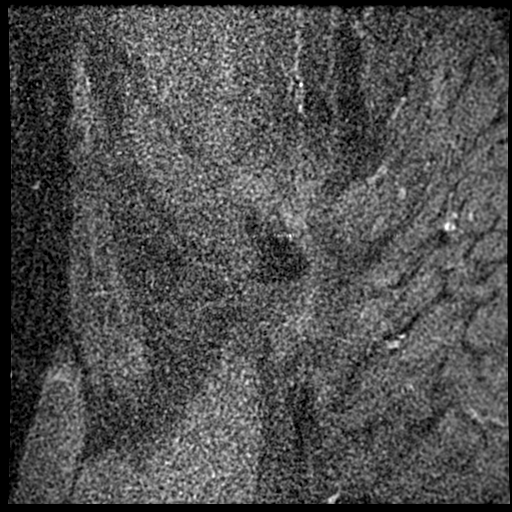
[im 26/26]
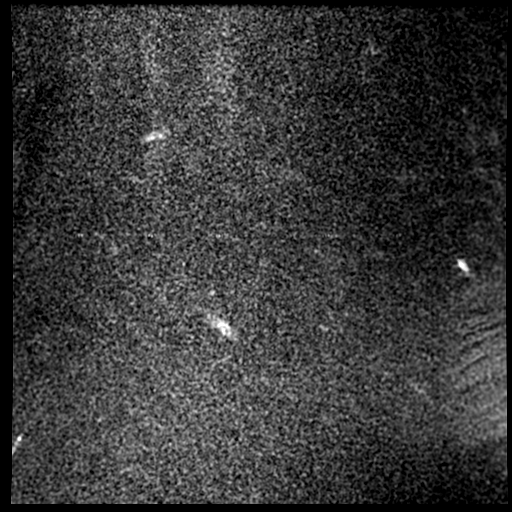

[19 of 40 positions shown; findings below may reference images not displayed]

FINDINGS: Bones: No fracture, dislocation or avascular necrosis. No aggressive
osseous lesion. Marginal osteophytosis of the left femoral head.
Subchondral reactive marrow changes in the superior acetabulum.

Mild osteoarthritis of bilateral sacroiliac joints, right worse than
left. No SI joint widening or erosive changes.

Degenerative disc disease with disc height loss at L4-5.

Articular cartilage and labrum

Articular cartilage: High-grade partial-thickness cartilage loss of
the left femoral head and acetabulum. Chondromalacia of the right
hip.

Labrum: Grossly intact, but evaluation is limited by lack of
intraarticular fluid.

Joint or bursal effusion

Joint effusion:  No hip joint effusion.  No SI joint effusion.

Bursae:  No bursa formation.

Muscles and tendons

Flexors: Normal.

Extensors: Normal.

Abductors: Normal.

Adductors: Normal.

Rotators: Normal.

Hamstrings: Normal.

Other findings

Miscellaneous: No pelvic free fluid. Fibroid uterus. No inguinal
lymphadenopathy. No inguinal hernia.
IMPRESSION: 1. Moderate osteoarthritis of the left hip.
2. Mild osteoarthritis of the right hip.

## 2018-02-10 ENCOUNTER — Encounter: Payer: Self-pay | Admitting: Registered Nurse

## 2018-02-10 ENCOUNTER — Encounter: Payer: BC Managed Care – PPO | Attending: Physical Medicine & Rehabilitation | Admitting: Registered Nurse

## 2018-02-10 VITALS — BP 139/78 | HR 84

## 2018-02-10 DIAGNOSIS — I471 Supraventricular tachycardia: Secondary | ICD-10-CM | POA: Diagnosis not present

## 2018-02-10 DIAGNOSIS — M5416 Radiculopathy, lumbar region: Secondary | ICD-10-CM

## 2018-02-10 DIAGNOSIS — Z809 Family history of malignant neoplasm, unspecified: Secondary | ICD-10-CM | POA: Diagnosis not present

## 2018-02-10 DIAGNOSIS — G4730 Sleep apnea, unspecified: Secondary | ICD-10-CM | POA: Insufficient documentation

## 2018-02-10 DIAGNOSIS — M25551 Pain in right hip: Secondary | ICD-10-CM | POA: Insufficient documentation

## 2018-02-10 DIAGNOSIS — Z833 Family history of diabetes mellitus: Secondary | ICD-10-CM | POA: Insufficient documentation

## 2018-02-10 DIAGNOSIS — G894 Chronic pain syndrome: Secondary | ICD-10-CM | POA: Diagnosis not present

## 2018-02-10 DIAGNOSIS — M25552 Pain in left hip: Secondary | ICD-10-CM | POA: Diagnosis not present

## 2018-02-10 DIAGNOSIS — Z8249 Family history of ischemic heart disease and other diseases of the circulatory system: Secondary | ICD-10-CM | POA: Insufficient documentation

## 2018-02-10 DIAGNOSIS — Z5181 Encounter for therapeutic drug level monitoring: Secondary | ICD-10-CM | POA: Diagnosis not present

## 2018-02-10 DIAGNOSIS — E119 Type 2 diabetes mellitus without complications: Secondary | ICD-10-CM | POA: Insufficient documentation

## 2018-02-10 DIAGNOSIS — M160 Bilateral primary osteoarthritis of hip: Secondary | ICD-10-CM

## 2018-02-10 DIAGNOSIS — M7061 Trochanteric bursitis, right hip: Secondary | ICD-10-CM | POA: Diagnosis not present

## 2018-02-10 DIAGNOSIS — I10 Essential (primary) hypertension: Secondary | ICD-10-CM | POA: Insufficient documentation

## 2018-02-10 DIAGNOSIS — G8929 Other chronic pain: Secondary | ICD-10-CM | POA: Insufficient documentation

## 2018-02-10 DIAGNOSIS — Z79899 Other long term (current) drug therapy: Secondary | ICD-10-CM

## 2018-02-10 DIAGNOSIS — M7062 Trochanteric bursitis, left hip: Secondary | ICD-10-CM

## 2018-02-10 DIAGNOSIS — F418 Other specified anxiety disorders: Secondary | ICD-10-CM | POA: Diagnosis not present

## 2018-02-10 MED ORDER — HYDROCODONE-ACETAMINOPHEN 10-325 MG PO TABS: 1.0000 | ORAL_TABLET | Freq: Four times a day (QID) | ORAL | 0 refills | Status: DC | PRN

## 2018-02-10 NOTE — Progress Notes (Signed)
Subjective:    Patient ID: [name], female    DOB: Aug 13, 1960, 57 y.o.   MRN: [id number]  HPI: [name] is a 57 y.o. female who returns for follow up appointment for chronic pain and medication refill. She states her pain is located in her lower back radiating into her bilateral hips and bilateral lower extremities. She rates her pain 7.Her current exercise regime is walking 45 minutes a day and performing stretching exercises.  Ms. Labine Morphine equivalent is 40.00 MME. She is also prescribed Lorazepam by [name] .We have discussed the black box warning of using opioids and benzodiazepines. I highlighted the dangers of using these drugs together and discussed the adverse events including respiratory suppression, overdose, cognitive impairment and importance of compliance with current regimen. We will continue to monitor and adjust as indicated.   Pain Inventory Average Pain 8 Pain Right Now 7 My pain is sharp, dull and aching  In the last 24 hours, has pain interfered with the following? General activity 3 Relation with others 1 Enjoyment of life 1 What TIME of day is your pain at its worst? daytime Sleep (in general) Fair  Pain is worse with: walking, bending, standing and some activites Pain improves with: rest, heat/ice and medication Relief from Meds: 4  Mobility walk without assistance do you drive?  yes  Function employed # of hrs/week 38  Neuro/Psych No problems in this area  Prior Studies Any changes since last visit?  no  Physicians involved in your care Any changes since last visit?  no   Family History  Problem Relation Age of Onset  . Hypertension Mother   . Alzheimer's disease Mother   . Diabetes Father   . Breast cancer Sister 13  . Anesthesia problems Neg Hx   . Hypotension Neg Hx   . Malignant hyperthermia Neg Hx   . Pseudochol deficiency Neg Hx   . Colon cancer Neg Hx   . Esophageal cancer Neg Hx   . Stomach  cancer Neg Hx   . Rectal cancer Neg Hx   . Thyroid disease Neg Hx    Social History   Socioeconomic History  . Marital status: Divorced    Spouse name: Not on file  . Number of children: 0  . Years of education: Not on file  . Highest education level: Not on file  Occupational History  . Occupation: TEACHER ASSISTANT  Social Needs  . Financial resource strain: Not on file  . Food insecurity:    Worry: Not on file    Inability: Not on file  . Transportation needs:    Medical: Not on file    Non-medical: Not on file  Tobacco Use  . Smoking status: Never Smoker  . Smokeless tobacco: Never Used  Substance and Sexual Activity  . Alcohol use: No  . Drug use: No  . Sexual activity: Yes  Lifestyle  . Physical activity:    Days per week: Not on file    Minutes per session: Not on file  . Stress: Not on file  Relationships  . Social connections:    Talks on phone: Not on file    Gets together: Not on file    Attends religious service: Not on file    Active member of club or organization: Not on file    Attends meetings of clubs or organizations: Not on file    Relationship status: Not on file  Other Topics Concern  . Not on file  Social History Narrative  . Not on file   Past Surgical History:  Procedure Laterality Date  . ANAL EXAMINATION UNDER ANESTHESIA  02/21/11   anal fistula  . BREAST SURGERY  patient does not remember date of procedure   pull fluid off lft br  . ELECTROPHYSIOLOGIC STUDY N/A 05/05/2015   Procedure: SVT Ablation;  Surgeon: Will Meredith Leeds, MD;  Location: [facility];  Service: Cardiovascular;  Laterality: N/A;  . EP IMPLANTABLE DEVICE N/A 01/30/2016   Procedure: Loop Recorder Insertion;  Surgeon: Sanda Klein, MD;  Location: [facility];  Service: Cardiovascular;  Laterality: N/A;  . INCISE AND DRAIN ABCESS     abscess on right thigh and buttock  . KNEE ARTHROSCOPY     left  . SHOULDER SURGERY  04/14/09   right   Past  Medical History:  Diagnosis Date  . Abscess    increased drainage from abscess on buttock  . Anal fistula   . Anxiety   . Bilateral hip pain 05/27/2015  . Chronic pain syndrome 05/27/2015  . Depression    sees [name]  . Diabetes mellitus without complication ([place])   . Hypertension   . Sleep apnea    2008- sleep study, neg. for sleep apnea   . SVT (supraventricular tachycardia) (HCC)    BP 139/78 (BP Location: Left Arm, Patient Position: Sitting, Cuff Size: Normal)   Pulse 84   LMP 02/09/2017   SpO2 97%   Opioid Risk Score:   Fall Risk Score:  `1  Depression screen PHQ 2/9  Depression screen [facility] 2/9 10/28/2017 10/02/2017 07/15/2017 05/23/2017 04/12/2017 03/21/2017 12/24/2016  Decreased Interest 0 0 0 1 3 0 1  Down, Depressed, Hopeless 0 3 0 1 2 0 1  PHQ - 2 Score 0 3 0 2 5 0 2  Altered sleeping - 1 - 0 3 - -  Tired, decreased energy - 0 - 1 3 - -  Change in appetite - 1 - 0 2 - -  Feeling bad or failure about yourself  - 0 - 0 1 - -  Trouble concentrating - 0 - 0 1 - -  Moving slowly or fidgety/restless - 0 - 0 0 - -  Suicidal thoughts - 0 - 0 0 - -  PHQ-9 Score - 5 - 3 15 - -  Difficult doing work/chores - Somewhat difficult - - - - -  Some recent data might be hidden     Review of Systems  Constitutional: Negative.   HENT: Negative.   Eyes: Negative.   Respiratory: Negative.   Cardiovascular: Negative.   Gastrointestinal: Negative.   Endocrine: Negative.   Genitourinary: Negative.   Musculoskeletal: Positive for arthralgias and myalgias.  Skin: Negative.   Allergic/Immunologic: Negative.   Neurological: Negative.   Hematological: Negative.   Psychiatric/Behavioral: Negative.   All other systems reviewed and are negative.      Objective:   Physical Exam  Constitutional: She is oriented to person, place, and time. She appears well-developed and well-nourished.  HENT:  Head: Normocephalic and atraumatic.  Neck: Normal range of motion. Neck supple.    Cardiovascular: Normal rate and regular rhythm.  Pulmonary/Chest: Effort normal and breath sounds normal.  Musculoskeletal:  Normal Muscle Bulk and Muscle Testing Reveals:  Upper Extremities: Full ROM and Muscle Strength 5/5  Lumbar Paraspinal Tenderness: L-4-L-5 Lower Extremities: Full ROM and Muscle Strength 5/5 Narrow Based Gait   Neurological: She is alert and oriented to person, place, and  time.  Skin: Skin is warm and dry.  Psychiatric: She has a normal mood and affect. Her behavior is normal.  Nursing note and vitals reviewed.         Assessment & Plan:  1.Chronic BilateralHip pain L>R---endstage OA of hips..Continue HEP as tolerated and Heat and Ice Therapy. Refilled:Hydrocodone 10/325 one q6 hours as needed for moderate pain #120, 02/10/2018. Second script e-scribed for the following month. We will continue the opioid monitoring program, this consists of regular clinic visits, examinations, urine drug screen, pill counts as well as use of [place] Controlled Substance Reporting System. 2. Lumbar Radiculitis:Continue medication regimen with Pamelor. Continue Cymbalta and Continue HEP as Tolerated. Continue to Monitor. 02/10/2018.- 3. Morbid obesity: Continue with healthy diet Regime and HEP. Encouraged to continue with healthy diet regime and HEP.Marland Kitchen 02/10/2018. 4. Bilateral Greater Trochanteric Bursitis:.Continue with Heat and Ice Therapy. Continue to Monitor. 02/10/2018. 5. Left Knee Pain: No complaints today. Continue with HEP as Tolerated. Continue to Monitor. 02/10/2018.  20 minutes of face to face patient care time was spent during this visit. All questions were encouraged and answered.  F/U in 1 month

## 2018-02-17 ENCOUNTER — Ambulatory Visit (INDEPENDENT_AMBULATORY_CARE_PROVIDER_SITE_OTHER): Payer: BC Managed Care – PPO

## 2018-02-17 DIAGNOSIS — R55 Syncope and collapse: Secondary | ICD-10-CM | POA: Diagnosis not present

## 2018-02-18 ENCOUNTER — Encounter: Payer: Self-pay | Admitting: Registered Nurse

## 2018-02-19 NOTE — Progress Notes (Signed)
[facility] Summary Report / Loop Recorder 

## 2018-02-24 ENCOUNTER — Other Ambulatory Visit: Payer: Self-pay | Admitting: Physical Medicine & Rehabilitation

## 2018-02-24 ENCOUNTER — Other Ambulatory Visit: Payer: Self-pay | Admitting: Medical

## 2018-02-24 DIAGNOSIS — Z79899 Other long term (current) drug therapy: Secondary | ICD-10-CM

## 2018-02-24 DIAGNOSIS — M160 Bilateral primary osteoarthritis of hip: Secondary | ICD-10-CM

## 2018-02-24 DIAGNOSIS — G894 Chronic pain syndrome: Secondary | ICD-10-CM

## 2018-02-24 DIAGNOSIS — Z5181 Encounter for therapeutic drug level monitoring: Secondary | ICD-10-CM

## 2018-02-27 ENCOUNTER — Other Ambulatory Visit: Payer: Self-pay | Admitting: Physical Medicine & Rehabilitation

## 2018-02-27 DIAGNOSIS — M160 Bilateral primary osteoarthritis of hip: Secondary | ICD-10-CM

## 2018-02-27 DIAGNOSIS — G894 Chronic pain syndrome: Secondary | ICD-10-CM

## 2018-02-27 DIAGNOSIS — Z5181 Encounter for therapeutic drug level monitoring: Secondary | ICD-10-CM

## 2018-02-27 DIAGNOSIS — Z79899 Other long term (current) drug therapy: Secondary | ICD-10-CM

## 2018-03-06 ENCOUNTER — Other Ambulatory Visit: Payer: Self-pay | Admitting: Medical

## 2018-03-06 NOTE — Telephone Encounter (Signed)
Is this ok to refill?  

## 2018-03-08 LAB — CUP PACEART REMOTE DEVICE CHECK
Date Time Interrogation Session: 20191105150902
Implantable Pulse Generator Implant Date: 20171120

## 2018-03-21 ENCOUNTER — Ambulatory Visit (INDEPENDENT_AMBULATORY_CARE_PROVIDER_SITE_OTHER): Payer: BC Managed Care – PPO

## 2018-03-21 DIAGNOSIS — R55 Syncope and collapse: Secondary | ICD-10-CM | POA: Diagnosis not present

## 2018-03-22 LAB — CUP PACEART REMOTE DEVICE CHECK
Date Time Interrogation Session: 20200110154106
Implantable Pulse Generator Implant Date: 20171120

## 2018-03-24 NOTE — Progress Notes (Signed)
[facility] Summary Report / Loop Recorder 

## 2018-03-26 ENCOUNTER — Ambulatory Visit (INDEPENDENT_AMBULATORY_CARE_PROVIDER_SITE_OTHER): Payer: BC Managed Care – PPO | Admitting: Endocrinology

## 2018-03-26 ENCOUNTER — Encounter: Payer: Self-pay | Admitting: Endocrinology

## 2018-03-26 VITALS — BP 128/72 | HR 86 | Ht 63.0 in | Wt 254.8 lb

## 2018-03-26 DIAGNOSIS — E1165 Type 2 diabetes mellitus with hyperglycemia: Secondary | ICD-10-CM | POA: Diagnosis not present

## 2018-03-26 DIAGNOSIS — E118 Type 2 diabetes mellitus with unspecified complications: Secondary | ICD-10-CM

## 2018-03-26 DIAGNOSIS — IMO0002 Reserved for concepts with insufficient information to code with codable children: Secondary | ICD-10-CM

## 2018-03-26 LAB — POCT GLYCOSYLATED HEMOGLOBIN (HGB A1C): Hemoglobin A1C: 9.8 % — AB (ref 4.0–5.6)

## 2018-03-26 MED ORDER — BASAGLAR KWIKPEN 100 UNIT/ML ~~LOC~~ SOPN: 120.0000 [IU] | PEN_INJECTOR | Freq: Every morning | SUBCUTANEOUS | 3 refills | Status: DC

## 2018-03-26 NOTE — Progress Notes (Signed)
Subjective:    Patient ID: [name], female    DOB: 07-Apr-1960, 58 y.o.   MRN: [id number]  HPI Pt returns for f/u of diabetes mellitus: DM type: Insulin-requiring type 2 Dx'ed: 0277 Complications: renal failure Therapy: insulin since 2016, and Ozempic.  GDM: never (G0) DKA: never Severe hypoglycemia: never Pancreatitis: never Pancreatic imaging: normal on 2018 CT Other: she is on qd insulin, at least for now Interval history: no cbg record, but states fasting cbg's are in the 200's.  pt states she feels well in general.  She takes meds as rx'ed.  She also has multinodular goiter (dx'ed 2017; bx in 2018: bx of 2 nodules: Beth cat 1; f/u US in 2019 showed none meet criteria for biopsy or follow-up).   Past Medical History:  Diagnosis Date  . Abscess    increased drainage from abscess on buttock  . Anal fistula   . Anxiety   . Bilateral hip pain 05/27/2015  . Chronic pain syndrome 05/27/2015  . Depression    sees [name]  . Diabetes mellitus without complication ([place])   . Hypertension   . Sleep apnea    2008- sleep study, neg. for sleep apnea   . SVT (supraventricular tachycardia) (HCC)     Past Surgical History:  Procedure Laterality Date  . ANAL EXAMINATION UNDER ANESTHESIA  02/21/11   anal fistula  . BREAST SURGERY  patient does not remember date of procedure   pull fluid off lft br  . ELECTROPHYSIOLOGIC STUDY N/A 05/05/2015   Procedure: SVT Ablation;  Surgeon: Will Meredith Leeds, MD;  Location: [facility];  Service: Cardiovascular;  Laterality: N/A;  . EP IMPLANTABLE DEVICE N/A 01/30/2016   Procedure: Loop Recorder Insertion;  Surgeon: Sanda Klein, MD;  Location: [facility];  Service: Cardiovascular;  Laterality: N/A;  . INCISE AND DRAIN ABCESS     abscess on right thigh and buttock  . KNEE ARTHROSCOPY     left  . SHOULDER SURGERY  04/14/09   right    Social History   Socioeconomic History  . Marital status: Divorced    Spouse  name: Not on file  . Number of children: 0  . Years of education: Not on file  . Highest education level: Not on file  Occupational History  . Occupation: TEACHER ASSISTANT  Social Needs  . Financial resource strain: Not on file  . Food insecurity:    Worry: Not on file    Inability: Not on file  . Transportation needs:    Medical: Not on file    Non-medical: Not on file  Tobacco Use  . Smoking status: Never Smoker  . Smokeless tobacco: Never Used  Substance and Sexual Activity  . Alcohol use: No  . Drug use: No  . Sexual activity: Yes  Lifestyle  . Physical activity:    Days per week: Not on file    Minutes per session: Not on file  . Stress: Not on file  Relationships  . Social connections:    Talks on phone: Not on file    Gets together: Not on file    Attends religious service: Not on file    Active member of club or organization: Not on file    Attends meetings of clubs or organizations: Not on file    Relationship status: Not on file  . Intimate partner violence:    Fear of current or ex partner: Not on file    Emotionally abused: Not  on file    Physically abused: Not on file    Forced sexual activity: Not on file  Other Topics Concern  . Not on file  Social History Narrative  . Not on file    Current Outpatient Medications on File Prior to Visit  Medication Sig Dispense Refill  . atorvastatin (LIPITOR) 20 MG tablet Take 1 tablet (20 mg total) by mouth at bedtime. 90 tablet 1  . HYDROcodone-acetaminophen (NORCO) 10-325 MG tablet Take 1 tablet by mouth every 6 (six) hours as needed. 120 tablet 0  . nortriptyline (PAMELOR) 10 MG capsule TAKE 2 CAPSULES BY MOUTH AT BEDTIME 60 capsule 2  . pantoprazole (PROTONIX) 40 MG tablet TAKE 1 TABLET BY MOUTH TWICE DAILY TAKE  30-60  MINUTES  BEFORE  BREAKFAST  &  DINNER 60 tablet 1  . Semaglutide, 1 MG/DOSE, (OZEMPIC, 1 MG/DOSE,) 2 MG/1.5ML SOPN Inject 1 mg into the skin once a week. 4 pen 11  . LORazepam (ATIVAN) 0.5 MG  tablet TAKE 1 TABLET BY MOUTH TWICE DAILY AS NEEDED FOR ANXIETY (Patient not taking: Reported on 03/26/2018) 30 tablet 1   No current facility-administered medications on file prior to visit.     Allergies  Allergen Reactions  . Tramadol Nausea Only    Family History  Problem Relation Age of Onset  . Hypertension Mother   . Alzheimer's disease Mother   . Diabetes Father   . Breast cancer Sister 43  . Anesthesia problems Neg Hx   . Hypotension Neg Hx   . Malignant hyperthermia Neg Hx   . Pseudochol deficiency Neg Hx   . Colon cancer Neg Hx   . Esophageal cancer Neg Hx   . Stomach cancer Neg Hx   . Rectal cancer Neg Hx   . Thyroid disease Neg Hx     BP 128/72 (BP Location: Left Arm, Patient Position: Sitting, Cuff Size: Normal)   Pulse 86   Ht 5\' 3"  (1.6 m)   Wt 254 lb 12.8 oz (115.6 kg)   LMP 02/09/2017   SpO2 97%   BMI 45.14 kg/m    Review of Systems She denies hypoglycemia    Objective:   Physical Exam VITAL SIGNS:  See vs page GENERAL: no distress Pulses: dorsalis pedis intact bilat.   MSK: no deformity of the feet CV: trace bilat leg edema Skin:  no ulcer on the feet.  normal color and temp on the feet. Neuro: sensation is intact to touch on the feet Ext: There is bilateral onychomycosis of the toenails  Lab Results  Component Value Date   HGBA1C 9.8 (A) 03/26/2018   Lab Results  Component Value Date   CREATININE 1.36 (H) 12/05/2017   BUN 24 12/05/2017   NA 135 12/05/2017   K 4.1 12/05/2017   CL 98 12/05/2017   CO2 23 12/05/2017        Assessment & Plan:  Insulin-requiring type 2 DM: worse Renal insuff: in this context, she is at risk for nocturnal hypoglycemia. Edema: this limits rx options.   Patient Instructions  check your blood sugar twice a day.  vary the time of day when you check, between before the 3 meals, and at bedtime.  also check if you have symptoms of your blood sugar being too high or too low.  please keep a record of the  readings and bring it to your next appointment here (or you can bring the meter itself).  You can write it on any piece of paper.  please call us sooner if your blood sugar goes below 70, or if you have a lot of readings over 200.   please increase the Basaglar to 120 units each morning, and:   Please continue the same Ozempic. Please come back for a follow-up appointment in 2 months.

## 2018-03-26 NOTE — Patient Instructions (Addendum)
check your blood sugar twice a day.  vary the time of day when you check, between before the 3 meals, and at bedtime.  also check if you have symptoms of your blood sugar being too high or too low.  please keep a record of the readings and bring it to your next appointment here (or you can bring the meter itself).  You can write it on any piece of paper.  please call us sooner if your blood sugar goes below 70, or if you have a lot of readings over 200.   please increase the Basaglar to 120 units each morning, and:   Please continue the same Ozempic. Please come back for a follow-up appointment in 2 months.

## 2018-03-27 ENCOUNTER — Other Ambulatory Visit: Payer: Self-pay | Admitting: Medical

## 2018-03-27 DIAGNOSIS — Z1231 Encounter for screening mammogram for malignant neoplasm of breast: Secondary | ICD-10-CM

## 2018-03-28 ENCOUNTER — Other Ambulatory Visit: Payer: Self-pay | Admitting: Internal Medicine

## 2018-03-28 ENCOUNTER — Other Ambulatory Visit: Payer: Self-pay | Admitting: Physical Medicine & Rehabilitation

## 2018-03-28 ENCOUNTER — Other Ambulatory Visit: Payer: Self-pay | Admitting: Medical

## 2018-03-28 ENCOUNTER — Other Ambulatory Visit: Payer: Self-pay | Admitting: Family Medicine

## 2018-03-28 ENCOUNTER — Telehealth: Payer: Self-pay | Admitting: Medical

## 2018-03-28 DIAGNOSIS — M160 Bilateral primary osteoarthritis of hip: Secondary | ICD-10-CM

## 2018-03-28 DIAGNOSIS — Z79899 Other long term (current) drug therapy: Secondary | ICD-10-CM

## 2018-03-28 DIAGNOSIS — R609 Edema, unspecified: Secondary | ICD-10-CM

## 2018-03-28 DIAGNOSIS — I471 Supraventricular tachycardia: Secondary | ICD-10-CM

## 2018-03-28 DIAGNOSIS — G894 Chronic pain syndrome: Secondary | ICD-10-CM

## 2018-03-28 DIAGNOSIS — Z5181 Encounter for therapeutic drug level monitoring: Secondary | ICD-10-CM

## 2018-03-28 NOTE — Telephone Encounter (Signed)
Is this okay to refill? 

## 2018-03-28 NOTE — Telephone Encounter (Signed)
Lets get in for med check soon

## 2018-03-30 LAB — CUP PACEART REMOTE DEVICE CHECK
Date Time Interrogation Session: 20191208153643
Implantable Pulse Generator Implant Date: 20171120

## 2018-03-31 NOTE — Telephone Encounter (Signed)
Pt is coming in Jan the 29th

## 2018-04-01 ENCOUNTER — Telehealth: Payer: Self-pay | Admitting: Internal Medicine

## 2018-04-01 MED ORDER — PANTOPRAZOLE SODIUM 40 MG PO TBEC: DELAYED_RELEASE_TABLET | ORAL | 0 refills | Status: DC

## 2018-04-01 NOTE — Telephone Encounter (Signed)
Pt just made appt for 04-14-18 and is requesting a refill since she is out. She needs refill for pantoprazole (PROTONIX) Pharmacy is [place], [place] - [phone]

## 2018-04-01 NOTE — Telephone Encounter (Signed)
Rx sent 

## 2018-04-07 ENCOUNTER — Encounter: Payer: BC Managed Care – PPO | Attending: Physical Medicine & Rehabilitation | Admitting: Registered Nurse

## 2018-04-07 ENCOUNTER — Encounter: Payer: Self-pay | Admitting: Registered Nurse

## 2018-04-07 VITALS — BP 130/79 | HR 84 | Ht 63.0 in | Wt 257.0 lb

## 2018-04-07 DIAGNOSIS — M25551 Pain in right hip: Secondary | ICD-10-CM | POA: Insufficient documentation

## 2018-04-07 DIAGNOSIS — I10 Essential (primary) hypertension: Secondary | ICD-10-CM | POA: Diagnosis not present

## 2018-04-07 DIAGNOSIS — M7061 Trochanteric bursitis, right hip: Secondary | ICD-10-CM

## 2018-04-07 DIAGNOSIS — M25552 Pain in left hip: Secondary | ICD-10-CM | POA: Insufficient documentation

## 2018-04-07 DIAGNOSIS — M160 Bilateral primary osteoarthritis of hip: Secondary | ICD-10-CM

## 2018-04-07 DIAGNOSIS — I471 Supraventricular tachycardia: Secondary | ICD-10-CM | POA: Insufficient documentation

## 2018-04-07 DIAGNOSIS — G4730 Sleep apnea, unspecified: Secondary | ICD-10-CM | POA: Insufficient documentation

## 2018-04-07 DIAGNOSIS — Z8249 Family history of ischemic heart disease and other diseases of the circulatory system: Secondary | ICD-10-CM | POA: Insufficient documentation

## 2018-04-07 DIAGNOSIS — G8929 Other chronic pain: Secondary | ICD-10-CM | POA: Diagnosis not present

## 2018-04-07 DIAGNOSIS — M5416 Radiculopathy, lumbar region: Secondary | ICD-10-CM

## 2018-04-07 DIAGNOSIS — Z79899 Other long term (current) drug therapy: Secondary | ICD-10-CM

## 2018-04-07 DIAGNOSIS — M7632 Iliotibial band syndrome, left leg: Secondary | ICD-10-CM

## 2018-04-07 DIAGNOSIS — Z809 Family history of malignant neoplasm, unspecified: Secondary | ICD-10-CM | POA: Insufficient documentation

## 2018-04-07 DIAGNOSIS — G894 Chronic pain syndrome: Secondary | ICD-10-CM | POA: Diagnosis not present

## 2018-04-07 DIAGNOSIS — Z833 Family history of diabetes mellitus: Secondary | ICD-10-CM | POA: Insufficient documentation

## 2018-04-07 DIAGNOSIS — Z5181 Encounter for therapeutic drug level monitoring: Secondary | ICD-10-CM

## 2018-04-07 DIAGNOSIS — F418 Other specified anxiety disorders: Secondary | ICD-10-CM | POA: Diagnosis not present

## 2018-04-07 DIAGNOSIS — E119 Type 2 diabetes mellitus without complications: Secondary | ICD-10-CM | POA: Diagnosis not present

## 2018-04-07 DIAGNOSIS — M7062 Trochanteric bursitis, left hip: Secondary | ICD-10-CM

## 2018-04-07 MED ORDER — HYDROCODONE-ACETAMINOPHEN 10-325 MG PO TABS: 1.0000 | ORAL_TABLET | Freq: Four times a day (QID) | ORAL | 0 refills | Status: DC | PRN

## 2018-04-07 NOTE — Progress Notes (Signed)
Subjective:    Patient ID: [name], female    DOB: 1960/06/14, 58 y.o.   MRN: [id number]  HPI: [name] is a 58 y.o. female who returns for follow up appointment for chronic pain and medication refill. She states her pain is located in her lower back radiating into her bilateral hips L>R. She rates her pain 8. Her current exercise regime is walking and performing stretching exercises.  [name] states she was outside with her family on 03/04/2018 playing football when she lost her balanced and fell forward. Her family helped her up, she didn't seek medical attention she reports.   [name] Morphine equivalent is 40.00  MME.   She is also prescribed Lorazepam by [name] We have discussed the black box warning of using opioids and benzodiazepines. I highlighted the dangers of using these drugs together and discussed the adverse events including respiratory suppression, overdose, cognitive impairment and importance of compliance with current regimen. We will continue to monitor and adjust as indicated.   Last UDS was Performed on 10/28/2017, it was consistent.   Pain Inventory Average Pain 8 Pain Right Now 8 My pain is sharp, dull and stabbing  In the last 24 hours, has pain interfered with the following? General activity 10 Relation with others 10 Enjoyment of life 10 What TIME of day is your pain at its worst? daytime, evening  Sleep (in general) Poor  Pain is worse with: walking, bending and standing Pain improves with: rest and medication Relief from Meds: 5  Mobility walk without assistance Do you have any goals in this area?  no  Function employed # of hrs/week 40 I need assistance with the following:  meal prep, household duties and shopping Do you have any goals in this area?  yes  Neuro/Psych No problems in this area  Prior Studies Any changes since last visit?  no  Physicians involved in your care Any changes since last  visit?  no   Family History  Problem Relation Age of Onset  . Hypertension Mother   . Alzheimer's disease Mother   . Diabetes Father   . Breast cancer Sister 60  . Anesthesia problems Neg Hx   . Hypotension Neg Hx   . Malignant hyperthermia Neg Hx   . Pseudochol deficiency Neg Hx   . Colon cancer Neg Hx   . Esophageal cancer Neg Hx   . Stomach cancer Neg Hx   . Rectal cancer Neg Hx   . Thyroid disease Neg Hx    Social History   Socioeconomic History  . Marital status: Divorced    Spouse name: Not on file  . Number of children: 0  . Years of education: Not on file  . Highest education level: Not on file  Occupational History  . Occupation: TEACHER ASSISTANT  Social Needs  . Financial resource strain: Not on file  . Food insecurity:    Worry: Not on file    Inability: Not on file  . Transportation needs:    Medical: Not on file    Non-medical: Not on file  Tobacco Use  . Smoking status: Never Smoker  . Smokeless tobacco: Never Used  Substance and Sexual Activity  . Alcohol use: No  . Drug use: No  . Sexual activity: Yes  Lifestyle  . Physical activity:    Days per week: Not on file    Minutes per session: Not on file  . Stress: Not on file  Relationships  .  Social connections:    Talks on phone: Not on file    Gets together: Not on file    Attends religious service: Not on file    Active member of club or organization: Not on file    Attends meetings of clubs or organizations: Not on file    Relationship status: Not on file  Other Topics Concern  . Not on file  Social History Narrative  . Not on file   Past Surgical History:  Procedure Laterality Date  . ANAL EXAMINATION UNDER ANESTHESIA  02/21/11   anal fistula  . BREAST SURGERY  patient does not remember date of procedure   pull fluid off lft br  . ELECTROPHYSIOLOGIC STUDY N/A 05/05/2015   Procedure: SVT Ablation;  Surgeon: Will Meredith Leeds, MD;  Location: [facility];  Service:  Cardiovascular;  Laterality: N/A;  . EP IMPLANTABLE DEVICE N/A 01/30/2016   Procedure: Loop Recorder Insertion;  Surgeon: Sanda Klein, MD;  Location: [facility];  Service: Cardiovascular;  Laterality: N/A;  . INCISE AND DRAIN ABCESS     abscess on right thigh and buttock  . KNEE ARTHROSCOPY     left  . SHOULDER SURGERY  04/14/09   right   Past Medical History:  Diagnosis Date  . Abscess    increased drainage from abscess on buttock  . Anal fistula   . Anxiety   . Bilateral hip pain 05/27/2015  . Chronic pain syndrome 05/27/2015  . Depression    sees [name]  . Diabetes mellitus without complication ([place])   . Hypertension   . Sleep apnea    2008- sleep study, neg. for sleep apnea   . SVT (supraventricular tachycardia) ([facility])    BP 130/79   Pulse 84   Ht 5\' 3"  (1.6 m)   Wt 257 lb (116.6 kg)   LMP 02/09/2017   SpO2 94%   BMI 45.53 kg/m   Opioid Risk Score:   Fall Risk Score:  `1  Depression screen PHQ 2/9  Depression screen [facility] 2/9 10/28/2017 10/02/2017 07/15/2017 05/23/2017 04/12/2017 03/21/2017 12/24/2016  Decreased Interest 0 0 0 1 3 0 1  Down, Depressed, Hopeless 0 3 0 1 2 0 1  PHQ - 2 Score 0 3 0 2 5 0 2  Altered sleeping - 1 - 0 3 - -  Tired, decreased energy - 0 - 1 3 - -  Change in appetite - 1 - 0 2 - -  Feeling bad or failure about yourself  - 0 - 0 1 - -  Trouble concentrating - 0 - 0 1 - -  Moving slowly or fidgety/restless - 0 - 0 0 - -  Suicidal thoughts - 0 - 0 0 - -  PHQ-9 Score - 5 - 3 15 - -  Difficult doing work/chores - Somewhat difficult - - - - -  Some recent data might be hidden    Review of Systems  Constitutional: Negative.   HENT: Negative.   Eyes: Negative.   Respiratory: Negative.   Cardiovascular: Negative.   Gastrointestinal: Negative.   Endocrine: Negative.   Genitourinary: Negative.   Musculoskeletal: Positive for back pain.  Skin: Negative.   Allergic/Immunologic: Negative.   Neurological: Negative.   Hematological:  Negative.   Psychiatric/Behavioral: Negative.   All other systems reviewed and are negative.      Objective:   Physical Exam Vitals signs and nursing note reviewed.  Constitutional:      Appearance: Normal appearance.  She is obese.  Neck:     Musculoskeletal: Normal range of motion and neck supple.  Cardiovascular:     Rate and Rhythm: Normal rate and regular rhythm.     Pulses: Normal pulses.     Heart sounds: Normal heart sounds.  Pulmonary:     Effort: Pulmonary effort is normal.     Breath sounds: Normal breath sounds.  Musculoskeletal:     Comments: Normal Muscle Bulk and Muscle Testing Reveals:  Upper Extremities: Full ROM and Muscle Strength 5/5  Lumbar Paraspinal Tenderness: L-4-L-5 Bilateral Greater Trochanter Tenderness Lower Extremities: Full ROM and Muscle Strength 5/5 Arises from Table with ease Narrow BasedGait   Skin:    General: Skin is warm and dry.  Neurological:     Mental Status: She is alert and oriented to person, place, and time.  Psychiatric:        Mood and Affect: Mood normal.        Behavior: Behavior normal.           Assessment & Plan:  1.Chronic BilateralHip pain L>R---endstage OA of hips..Continue HEP as tolerated and Heat and Ice Therapy. Refilled:Hydrocodone 10/325 one q6 hours as needed for moderate pain #120, 04/07/2018. Second script e-scribed for the following month. We will continue the opioid monitoring program, this consists of regular clinic visits, examinations, urine drug screen, pill counts as well as use of [place] Controlled Substance Reporting System. 2. Lumbar Radiculitis:Continue medication regimen withPamelor. Continue Cymbalta and Continue HEP as Tolerated. Continue to Monitor. 04/07/2018- 3. Morbid obesity: Continue with healthy diet Regime and HEP. Encouraged to continue with healthy diet regime and HEP. 04/07/2018 4. Bilateral Greater Trochanteric Bursitis:.Continue with Heat  and Ice Therapy. Continue to Monitor. 04/07/2018 5. Left Knee Pain: No complaints today.Continue with HEP as Tolerated. Continue to Monitor.04/07/2018 6. IT Band Syndrome: Encouraged to continue with HEP and Exercises provided, she verbalizes understanding.   20 minutes of face to face patient care time was spent during this visit. All questions were encouraged and answered.  F/U in 2 months

## 2018-04-09 ENCOUNTER — Encounter: Payer: BC Managed Care – PPO | Admitting: Medical

## 2018-04-14 ENCOUNTER — Ambulatory Visit (INDEPENDENT_AMBULATORY_CARE_PROVIDER_SITE_OTHER): Payer: BC Managed Care – PPO | Admitting: Medical

## 2018-04-14 ENCOUNTER — Ambulatory Visit: Payer: BC Managed Care – PPO | Admitting: Physician Assistant

## 2018-04-14 ENCOUNTER — Encounter: Payer: Self-pay | Admitting: Medical

## 2018-04-14 VITALS — BP 120/70 | HR 79 | Temp 98.1°F | Resp 16 | Ht 63.0 in | Wt 256.8 lb

## 2018-04-14 DIAGNOSIS — E042 Nontoxic multinodular goiter: Secondary | ICD-10-CM

## 2018-04-14 DIAGNOSIS — G4700 Insomnia, unspecified: Secondary | ICD-10-CM

## 2018-04-14 DIAGNOSIS — R911 Solitary pulmonary nodule: Secondary | ICD-10-CM

## 2018-04-14 DIAGNOSIS — F411 Generalized anxiety disorder: Secondary | ICD-10-CM

## 2018-04-14 DIAGNOSIS — L732 Hidradenitis suppurativa: Secondary | ICD-10-CM

## 2018-04-14 DIAGNOSIS — E785 Hyperlipidemia, unspecified: Secondary | ICD-10-CM

## 2018-04-14 DIAGNOSIS — G894 Chronic pain syndrome: Secondary | ICD-10-CM

## 2018-04-14 DIAGNOSIS — IMO0002 Reserved for concepts with insufficient information to code with codable children: Secondary | ICD-10-CM

## 2018-04-14 DIAGNOSIS — E1165 Type 2 diabetes mellitus with hyperglycemia: Secondary | ICD-10-CM

## 2018-04-14 DIAGNOSIS — R600 Localized edema: Secondary | ICD-10-CM

## 2018-04-14 DIAGNOSIS — E118 Type 2 diabetes mellitus with unspecified complications: Secondary | ICD-10-CM | POA: Diagnosis not present

## 2018-04-14 DIAGNOSIS — L0292 Furuncle, unspecified: Secondary | ICD-10-CM | POA: Diagnosis not present

## 2018-04-14 DIAGNOSIS — K219 Gastro-esophageal reflux disease without esophagitis: Secondary | ICD-10-CM

## 2018-04-14 DIAGNOSIS — K2970 Gastritis, unspecified, without bleeding: Secondary | ICD-10-CM

## 2018-04-14 MED ORDER — NEBIVOLOL HCL 5 MG PO TABS: 5.0000 mg | ORAL_TABLET | Freq: Every day | ORAL | 0 refills | Status: DC

## 2018-04-14 NOTE — Progress Notes (Signed)
Subjective: Chief Complaint  Patient presents with  . Med check    Med check    Medical team: [name], endocrinology [name], pain management [name], GI [name], OB/Gyn [name], Cardiology  [name], [facility] [name], Theresa Eng, PA-C here for primary care  Concerns: Here for med check.  Needs refills on Pantoprazole 40 mg, but can't afford the $80 copay to go back to see [name].   Diabetes - recently saw endocrinology.  Having bad gas and GERD with injectable diabetes medication.   She states [name] wanted her to talk to GI about higher dose of GERD medication Taking Ozempic weekly injection, Basaglar, 100u QHS  She notes that she quit medication for anxiety (lorazepam), felt like it didn't help and felt worse anxiety.     sleep disturbance, insomnia-taking Ambien nightly but wakes up couple hours after taking it, has a hard time going back to sleep  Taking Cymbalta twice daily but does not feel like that is helping, consider coming off of this or changing this.  She weaned her self off Lorazepam, didn't think that was helping either.she would like to go back on Diazepam.  Was on this in the past with prior PCP [name], did better on this    Hx/o anal fistula, had surgery for this, but has other draining lesions.   Saw general surgery, [name] in the past for this.  Has one draining now in left inguinal region.    Hyperlipidemia-taking Lipitor a atorvastatin 20 mg daily  Chronic pain-sees Dr. Naaman Plummer, taking nortriptyline 10 mg 2 capsules nightly, taking diclofenac 50 mg twice daily, but she does not take this every single day.  She has a prescription for hydrocodone, and it is prescribed up to 4 times daily but only takes this as needed, some days once a day some days 3 times a day  Edema - taking Lasix 80mg  daily  Exercise - walking 30-57min 5 days per week.   Diet - currently is crazy.  The more upset or anxious, the  works she eats.    Due to this [name] wants to recheck thyroid function and possible ultrasounds.     Still working as Control and instrumentation engineer.     Past Medical History:  Diagnosis Date  . Abscess    increased drainage from abscess on buttock  . Anal fistula   . Anxiety   . Bilateral hip pain 05/27/2015  . Chronic pain syndrome 05/27/2015  . Depression    sees [name]  . Diabetes mellitus without complication ([place])   . Hypertension   . Sleep apnea    2008- sleep study, neg. for sleep apnea   . SVT (supraventricular tachycardia) ([place])    Current Outpatient Medications on File Prior to Visit  Medication Sig Dispense Refill  . atorvastatin (LIPITOR) 20 MG tablet Take 1 tablet (20 mg total) by mouth at bedtime. 90 tablet 1  . diclofenac (VOLTAREN) 50 MG EC tablet TAKE 1 TABLET BY MOUTH TWICE DAILY 60 tablet 0  . DULoxetine (CYMBALTA) 60 MG capsule TAKE 1 CAPSULE BY MOUTH TWICE DAILY 60 capsule 0  . furosemide (LASIX) 80 MG tablet TAKE 1 TABLET BY MOUTH ONCE DAILY 30 tablet 0  . HYDROcodone-acetaminophen (NORCO) 10-325 MG tablet Take 1 tablet by mouth every 6 (six) hours as needed. 120 tablet 0  . Insulin Glargine (BASAGLAR KWIKPEN) 100 UNIT/ML SOPN Inject 1.2 mLs (120 Units total) into the skin every  morning. And pen needles 1/day 35 pen 3  . nortriptyline (PAMELOR) 10 MG capsule TAKE 2 CAPSULES BY MOUTH AT BEDTIME 60 capsule 2  . Semaglutide, 1 MG/DOSE, (OZEMPIC, 1 MG/DOSE,) 2 MG/1.5ML SOPN Inject 1 mg into the skin once a week. 4 pen 11  . zolpidem (AMBIEN) 5 MG tablet TAKE 1 TABLET BY MOUTH ONCE DAILY AT BEDTIME AS NEEDED FOR SLEEP 30 tablet 0   No current facility-administered medications on file prior to visit.    ROS as in subjective  Objective: BP 120/70   Pulse 79   Temp 98.1 F (36.7 C) (Oral)   Resp 16   Ht 5\' 3"  (1.6 m)   Wt 256 lb 12.8 oz (116.5 kg)   LMP 02/09/2017   SpO2 97%   BMI 45.49 kg/m   Wt Readings from Last 3 Encounters:  04/14/18 256 lb 12.8 oz  (116.5 kg)  04/07/18 257 lb (116.6 kg)  03/26/18 254 lb 12.8 oz (115.6 kg)   BP Readings from Last 3 Encounters:  04/14/18 120/70  04/07/18 130/79  03/26/18 128/72   General appearance: alert, no distress, WD/WN,  HEENT: normocephalic, sclerae anicteric, TMs pearly, nares patent, no discharge or erythema, pharynx normal Oral cavity: MMM, no lesions Neck: supple, no lymphadenopathy,generalized thyromegaly, no specific nodules Heart: RRR, normal S1, S2, no murmurs Lungs: CTA bilaterally, no wheezes, rhonchi, or rales Ext: no edema Pulses: 2+ symmetric, upper and lower extremities, normal cap refill    Assessment: Encounter Diagnoses  Name Primary?  Marland Kitchen Uncontrolled diabetes mellitus with complications ([place]) Yes  . Multinodular goiter   . Furuncle   . Hidradenitis   . Chronic pain syndrome   . Bilateral leg edema   . Hyperlipidemia, unspecified hyperlipidemia type   . Insomnia, unspecified type   . Generalized anxiety disorder   . Pulmonary nodule   . Gastroesophageal reflux disease without esophagitis   . Gastritis without bleeding, unspecified chronicity, unspecified gastritis type      Plan: Diabetes- I reviewed her recent visit notes from April 01, 2018.  She is managed by [name]  Thyroid goiter- last thyroid labs July 2019.  She reports to [name] has planned to order an updated thyroid ultrasound on her.  She is awaiting scheduling.  She does note some trouble swallowing, thinks the goiter is gotten bigger  History of furuncle, hidradenitis-she plans to go back and see general surgery for recurrent issues with this  Chronic pain syndrome-managed by Dr. Naaman Plummer  GERD , hx/o gastritis on 05/2016 endoscopy.   Stop Pantoprazole and change to Dexilant.   Avoid GERD triggers, discussed proper use of medication, long-term risks of medication  Leg edema-continue diuretic but will need to recheck kidney marker and electrolytes at her next blood draw.  I will send a  message to endocrinology to add some labs on her next visit  Hyperlipidemia- continue current medication  Anxiety, insomnia-discussed current medications, discussed prior treatment.  She requests to go back on diazepam which she was on prior to her coming to our clinic.  She has already stopped lorazepam which she did not feel like was helping.  She is not sure she feels Cymbalta helps but will continue to take this for now as it may help her chronic pain as well.  I will send a message to consult with Dr. Naaman Plummer about diazepam in relation to her other medications including nortriptyline, Norco hydrocodone, diclofenac and Cymbalta  She has prior pulmonary nodule on chest CT 2017.  We did not discuss this today but looking back we may need to go ahead and reorder chest CT for surveillance  [name] was seen today for med check.  Diagnoses and all orders for this visit:  Uncontrolled diabetes mellitus with complications ([place])  Multinodular goiter  Furuncle  Hidradenitis  Chronic pain syndrome  Bilateral leg edema  Hyperlipidemia, unspecified hyperlipidemia type  Insomnia, unspecified type  Generalized anxiety disorder  Pulmonary nodule  Gastroesophageal reflux disease without esophagitis  Gastritis without bleeding, unspecified chronicity, unspecified gastritis type  Other orders -     nebivolol (BYSTOLIC) 5 MG tablet; Take 1 tablet (5 mg total) by mouth daily. -     dexlansoprazole (DEXILANT) 60 MG capsule; Take 1 capsule (60 mg total) by mouth daily.

## 2018-04-15 ENCOUNTER — Telehealth: Payer: Self-pay | Admitting: Medical

## 2018-04-15 MED ORDER — DEXLANSOPRAZOLE 60 MG PO CPDR: 60.0000 mg | DELAYED_RELEASE_CAPSULE | Freq: Every day | ORAL | 1 refills | Status: DC

## 2018-04-15 NOTE — Telephone Encounter (Signed)
Looking back over her chart, she had a pulmonary nodule on CT in past year.   She is due for repeat CT chest for surveillance of this.  See if agreeable to CT chest.  We are still pending call back from Dr. Albertina Senegal regarding adding Diazepam for sleep and muscle spasm.  She has used this in the past.   We stopped Lorazepam.  She will continue Cymbalta for now.  If she hasn't heard from [place] within a week, then call back  Still pending feedback from Dr. Loanne Drilling regarding thyroid ultrasound

## 2018-04-16 NOTE — Telephone Encounter (Signed)
See prior note

## 2018-04-17 NOTE — Telephone Encounter (Signed)
I consulted with her other doctor for pain, Dr. Naaman Plummer.  After our discussion I recommend we try either Trazodone or Belsorma for sleep instead of Ambien or Diazepam.   These would be safer overall, and are more suited to help with sleep.   If agreeable I'll send Trazodone.   Dr. Naaman Plummer and I both recommend she see a licenses counselor to help with coping skills, dealing with her health issues, evaluating her mental health and wellbeing.

## 2018-04-18 ENCOUNTER — Telehealth: Payer: Self-pay | Admitting: Medical

## 2018-04-18 ENCOUNTER — Other Ambulatory Visit: Payer: Self-pay | Admitting: Medical

## 2018-04-18 NOTE — Telephone Encounter (Signed)
Pt said questions already answered.

## 2018-04-18 NOTE — Telephone Encounter (Signed)
Ok.  See other message regarding Diazepam

## 2018-04-18 NOTE — Telephone Encounter (Signed)
Pt is agreeable to Trazadone. Please send to [facility] on [address]. Also advised of counselor.

## 2018-04-18 NOTE — Telephone Encounter (Signed)
Pt called and stated that the Dexilant is working better. She also states there was a question concerning Diazepam. She would like to know about that. Please advise pt at [phone].

## 2018-04-21 ENCOUNTER — Other Ambulatory Visit: Payer: Self-pay | Admitting: Medical

## 2018-04-21 MED ORDER — TRAZODONE HCL 50 MG PO TABS: ORAL_TABLET | ORAL | 2 refills | Status: DC

## 2018-04-21 NOTE — Telephone Encounter (Signed)
I sent trazodone to the pharmacy to take for sleep.  She can begin 1/2 tablet at bedtime for now.  Less than 1/2 tablet at bedtime for the next 2 to 3 weeks as this should work to help with sleep.  This would replace Ambien and Lorazepam.  Please follow-up in a 3 to 4 weeks on sleep

## 2018-04-21 NOTE — Telephone Encounter (Signed)
Left message on voicemail for patient to call back. 

## 2018-04-22 NOTE — Telephone Encounter (Signed)
Left message on voicemail for patient to call back. 

## 2018-04-22 NOTE — Telephone Encounter (Signed)
Patient notified of recommendations. 

## 2018-04-23 ENCOUNTER — Ambulatory Visit (INDEPENDENT_AMBULATORY_CARE_PROVIDER_SITE_OTHER): Payer: BC Managed Care – PPO

## 2018-04-23 DIAGNOSIS — R55 Syncope and collapse: Secondary | ICD-10-CM | POA: Diagnosis not present

## 2018-04-24 LAB — CUP PACEART REMOTE DEVICE CHECK
Date Time Interrogation Session: 20200212163951
Implantable Pulse Generator Implant Date: 20171120

## 2018-04-28 ENCOUNTER — Telehealth: Payer: Self-pay

## 2018-04-28 NOTE — Telephone Encounter (Signed)
Patient called and states that trazodone is having the opposite effect on her she is staying awake and not resting at all.   She would like for you to put her back on ambein it seem to work better.

## 2018-04-29 ENCOUNTER — Other Ambulatory Visit: Payer: Self-pay | Admitting: Cardiovascular Disease

## 2018-04-29 ENCOUNTER — Other Ambulatory Visit: Payer: Self-pay | Admitting: Physical Medicine & Rehabilitation

## 2018-04-29 ENCOUNTER — Other Ambulatory Visit: Payer: Self-pay | Admitting: Medical

## 2018-04-29 DIAGNOSIS — I471 Supraventricular tachycardia: Secondary | ICD-10-CM

## 2018-04-29 DIAGNOSIS — M160 Bilateral primary osteoarthritis of hip: Secondary | ICD-10-CM

## 2018-04-29 DIAGNOSIS — G894 Chronic pain syndrome: Secondary | ICD-10-CM

## 2018-04-29 DIAGNOSIS — H6503 Acute serous otitis media, bilateral: Secondary | ICD-10-CM

## 2018-04-29 DIAGNOSIS — R609 Edema, unspecified: Secondary | ICD-10-CM

## 2018-04-29 DIAGNOSIS — Z5181 Encounter for therapeutic drug level monitoring: Secondary | ICD-10-CM

## 2018-04-29 DIAGNOSIS — Z79899 Other long term (current) drug therapy: Secondary | ICD-10-CM

## 2018-04-29 NOTE — Telephone Encounter (Signed)
Left message on voicemail for patient to call back. 

## 2018-04-29 NOTE — Telephone Encounter (Signed)
I would give this more time as she just started this as a trial.  There will be an adjustment off of the old medicine and onto the new medication.  Also this may be a good time to refer her to neurology for a more in-depth evaluation of sleep.  I do not think she has seen a neurologist.  So please tentatively set her up for Dr. Leta Baptist or other neurologist for evaluation of sleep problems.  Let her know it would be good to go ahead and set up this appointment as it might take a month to get into neurology.  If trazodone continues to not help, then I would want her to see neurology

## 2018-04-29 NOTE — Telephone Encounter (Signed)
Patient notified of recommendation and states that is fine.   She wants to think on the referral for neurology.  She will let us know if she would like to have the referral.

## 2018-04-30 NOTE — Telephone Encounter (Signed)
Rx(s) sent to pharmacy electronically.  

## 2018-04-30 NOTE — Telephone Encounter (Signed)
Is this ok to refill?  

## 2018-05-01 ENCOUNTER — Ambulatory Visit: Payer: Self-pay | Admitting: General Surgery

## 2018-05-05 ENCOUNTER — Telehealth: Payer: Self-pay | Admitting: Endocrinology

## 2018-05-05 MED ORDER — SEMAGLUTIDE(0.25 OR 0.5MG/DOS) 2 MG/1.5ML ~~LOC~~ SOPN: 0.5000 mg | PEN_INJECTOR | SUBCUTANEOUS | 11 refills | Status: DC

## 2018-05-05 NOTE — Telephone Encounter (Signed)
Ok, I have sent a prescription to your pharmacy, to reduce to 0.5 mg weekly.

## 2018-05-05 NOTE — Telephone Encounter (Signed)
Stay off the med for now and monitor sugars.  Further treatment per Dr. Loanne Drilling when he comes back.

## 2018-05-05 NOTE — Telephone Encounter (Signed)
With Dr. Loanne Drilling being out of the office due to an illness, please let me know how I should proceed with addressing pt question/concern

## 2018-05-05 NOTE — Telephone Encounter (Signed)
LVM requesting returned call 

## 2018-05-05 NOTE — Telephone Encounter (Signed)
Patient stated that she is on the  Semaglutide, 1 MG/DOSE, (OZEMPIC, 1 MG/DOSE,) 2 MG/1.5ML SOPN  She is having Terrible side effect such as stomach pains . she was unable to get this medication last week and she is feeling a lot better  she would like to know if she could be prescribed something different

## 2018-05-05 NOTE — Addendum Note (Signed)
Addended by: Renato Shin on: 05/05/2018 03:10 PM   Modules accepted: Orders

## 2018-05-05 NOTE — Telephone Encounter (Signed)
Called pt and informed of Dr. Ellison's orders. Verbalized acceptance and understanding. 

## 2018-05-06 NOTE — Progress Notes (Signed)
[facility] Summary Report / Loop Recorder 

## 2018-05-07 ENCOUNTER — Ambulatory Visit: Payer: BC Managed Care – PPO | Admitting: Medical

## 2018-05-07 ENCOUNTER — Encounter: Payer: Self-pay | Admitting: Medical

## 2018-05-07 ENCOUNTER — Telehealth: Payer: Self-pay | Admitting: Medical

## 2018-05-07 VITALS — BP 120/70 | HR 84 | Temp 98.2°F | Resp 16 | Wt 257.2 lb

## 2018-05-07 DIAGNOSIS — G4700 Insomnia, unspecified: Secondary | ICD-10-CM

## 2018-05-07 DIAGNOSIS — F4310 Post-traumatic stress disorder, unspecified: Secondary | ICD-10-CM

## 2018-05-07 DIAGNOSIS — Z91410 Personal history of adult physical and sexual abuse: Secondary | ICD-10-CM

## 2018-05-07 DIAGNOSIS — K219 Gastro-esophageal reflux disease without esophagitis: Secondary | ICD-10-CM

## 2018-05-07 DIAGNOSIS — F411 Generalized anxiety disorder: Secondary | ICD-10-CM | POA: Diagnosis not present

## 2018-05-07 MED ORDER — DEXLANSOPRAZOLE 60 MG PO CPDR: 60.0000 mg | DELAYED_RELEASE_CAPSULE | Freq: Every day | ORAL | 1 refills | Status: DC

## 2018-05-07 MED ORDER — ZOLPIDEM TARTRATE 5 MG PO TABS: ORAL_TABLET | ORAL | 0 refills | Status: DC
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
## 2018-05-07 NOTE — Telephone Encounter (Signed)
Refer to neurology for sleep problems.

## 2018-05-08 ENCOUNTER — Other Ambulatory Visit: Payer: Self-pay

## 2018-05-08 DIAGNOSIS — G4700 Insomnia, unspecified: Secondary | ICD-10-CM

## 2018-05-08 DIAGNOSIS — F411 Generalized anxiety disorder: Secondary | ICD-10-CM

## 2018-05-08 NOTE — Telephone Encounter (Signed)
Done sent referral to Neurology.

## 2018-05-19 ENCOUNTER — Ambulatory Visit: Payer: BC Managed Care – PPO

## 2018-05-26 ENCOUNTER — Other Ambulatory Visit: Payer: Self-pay

## 2018-05-26 ENCOUNTER — Ambulatory Visit (INDEPENDENT_AMBULATORY_CARE_PROVIDER_SITE_OTHER): Payer: BC Managed Care – PPO | Admitting: *Deleted

## 2018-05-26 DIAGNOSIS — R55 Syncope and collapse: Secondary | ICD-10-CM | POA: Diagnosis not present

## 2018-05-27 ENCOUNTER — Other Ambulatory Visit: Payer: Self-pay | Admitting: Physical Medicine & Rehabilitation

## 2018-05-27 DIAGNOSIS — M160 Bilateral primary osteoarthritis of hip: Secondary | ICD-10-CM

## 2018-05-27 DIAGNOSIS — G894 Chronic pain syndrome: Secondary | ICD-10-CM

## 2018-05-27 DIAGNOSIS — Z5181 Encounter for therapeutic drug level monitoring: Secondary | ICD-10-CM

## 2018-05-27 DIAGNOSIS — Z79899 Other long term (current) drug therapy: Secondary | ICD-10-CM

## 2018-05-27 LAB — CUP PACEART REMOTE DEVICE CHECK
Date Time Interrogation Session: 20200316163836
Implantable Pulse Generator Implant Date: 20171120

## 2018-05-28 ENCOUNTER — Other Ambulatory Visit: Payer: Self-pay

## 2018-05-28 ENCOUNTER — Encounter: Payer: Self-pay | Admitting: Endocrinology

## 2018-05-28 ENCOUNTER — Ambulatory Visit (INDEPENDENT_AMBULATORY_CARE_PROVIDER_SITE_OTHER): Payer: BC Managed Care – PPO | Admitting: Endocrinology

## 2018-05-28 VITALS — Ht 63.0 in

## 2018-05-28 DIAGNOSIS — E1165 Type 2 diabetes mellitus with hyperglycemia: Secondary | ICD-10-CM

## 2018-05-28 DIAGNOSIS — E118 Type 2 diabetes mellitus with unspecified complications: Secondary | ICD-10-CM

## 2018-05-28 DIAGNOSIS — IMO0002 Reserved for concepts with insufficient information to code with codable children: Secondary | ICD-10-CM

## 2018-05-28 DIAGNOSIS — E042 Nontoxic multinodular goiter: Secondary | ICD-10-CM

## 2018-05-28 LAB — HEPATIC FUNCTION PANEL
ALBUMIN: 3.9 g/dL (ref 3.5–5.2)
ALT: 12 U/L (ref 0–35)
AST: 10 U/L (ref 0–37)
Alkaline Phosphatase: 136 U/L — ABNORMAL HIGH (ref 39–117)
Bilirubin, Direct: 0 mg/dL (ref 0.0–0.3)
Total Bilirubin: 0.3 mg/dL (ref 0.2–1.2)
Total Protein: 7.2 g/dL (ref 6.0–8.3)

## 2018-05-28 LAB — POCT GLYCOSYLATED HEMOGLOBIN (HGB A1C): Hemoglobin A1C: 11.2 % — AB (ref 4.0–5.6)

## 2018-05-28 LAB — BASIC METABOLIC PANEL
BUN: 18 mg/dL (ref 6–23)
CO2: 29 mEq/L (ref 19–32)
Calcium: 9.6 mg/dL (ref 8.4–10.5)
Chloride: 97 mEq/L (ref 96–112)
Creatinine, Ser: 1.26 mg/dL — ABNORMAL HIGH (ref 0.40–1.20)
GFR: 43.69 mL/min — ABNORMAL LOW (ref >60.00)
Glucose, Bld: 314 mg/dL — ABNORMAL HIGH (ref 70–99)
Potassium: 3.8 mEq/L (ref 3.5–5.1)
Sodium: 134 mEq/L — ABNORMAL LOW (ref 135–145)

## 2018-05-28 LAB — TSH: TSH: 0.21 u[IU]/mL — ABNORMAL LOW (ref 0.35–4.50)

## 2018-05-28 LAB — T4, FREE: Free T4: 0.71 ng/dL (ref 0.60–1.60)

## 2018-05-28 MED ORDER — BASAGLAR KWIKPEN 100 UNIT/ML ~~LOC~~ SOPN: 140.0000 [IU] | PEN_INJECTOR | SUBCUTANEOUS | 3 refills | Status: DC

## 2018-05-28 MED ORDER — SEMAGLUTIDE(0.25 OR 0.5MG/DOS) 2 MG/1.5ML ~~LOC~~ SOPN: 0.5000 mg | PEN_INJECTOR | SUBCUTANEOUS | 11 refills | Status: DC

## 2018-05-28 NOTE — Patient Instructions (Addendum)
check your blood sugar twice a day.  vary the time of day when you check, between before the 3 meals, and at bedtime.  also check if you have symptoms of your blood sugar being too high or too low.  please keep a record of the readings and bring it to your next appointment here (or you can bring the meter itself).  You can write it on any piece of paper.  please call us sooner if your blood sugar goes below 70, or if you have a lot of readings over 200.   please increase the Basaglar to 140 units each morning, and:  Please reduce the Ozempic to 0.5 mg weekly. Please call or message [place] next week, to tell us how the blood sugar is doing.   Please come back for a follow-up appointment in 2 months.

## 2018-05-28 NOTE — Progress Notes (Signed)
Subjective:    Patient ID: [name], female    DOB: 1960/08/16, 58 y.o.   MRN: [id number]  HPI Pt returns for f/u of diabetes mellitus: DM type: Insulin-requiring type 2 Dx'ed: 8315 Complications: renal insuff Therapy: insulin since 2016, and Ozempic.  GDM: never (G0) DKA: never Severe hypoglycemia: never Pancreatitis: never Pancreatic imaging: normal on 2018 CT Other: she is on qd insulin, at least for now. Interval history: no cbg record, but states fasting cbg's vary from 150-210.  Pt says she never misses the insulin.  She also has multinodular goiter (dx'ed 2017; bx in 2018, of 2 nodules: Beth cat 1; f/u US in 2019 showed none meet criteria for biopsy or follow-up).   Past Medical History:  Diagnosis Date  . Abscess    increased drainage from abscess on buttock  . Anal fistula   . Anxiety   . Bilateral hip pain 05/27/2015  . Chronic pain syndrome 05/27/2015  . Depression    sees [name]  . Diabetes mellitus without complication ([place])   . Hypertension   . Sleep apnea    2008- sleep study, neg. for sleep apnea   . SVT (supraventricular tachycardia) ([facility])     Past Surgical History:  Procedure Laterality Date  . ANAL EXAMINATION UNDER ANESTHESIA  02/21/11   anal fistula  . BREAST SURGERY  patient does not remember date of procedure   pull fluid off lft br  . ELECTROPHYSIOLOGIC STUDY N/A 05/05/2015   Procedure: SVT Ablation;  Surgeon: Will Meredith Leeds, MD;  Location: [facility];  Service: Cardiovascular;  Laterality: N/A;  . EP IMPLANTABLE DEVICE N/A 01/30/2016   Procedure: Loop Recorder Insertion;  Surgeon: Sanda Klein, MD;  Location: [facility];  Service: Cardiovascular;  Laterality: N/A;  . INCISE AND DRAIN ABCESS     abscess on right thigh and buttock  . KNEE ARTHROSCOPY     left  . SHOULDER SURGERY  04/14/09   right    Social History   Socioeconomic History  . Marital status: Divorced    Spouse name: Not on file  . Number  of children: 0  . Years of education: Not on file  . Highest education level: Not on file  Occupational History  . Occupation: TEACHER ASSISTANT  Social Needs  . Financial resource strain: Not on file  . Food insecurity:    Worry: Not on file    Inability: Not on file  . Transportation needs:    Medical: Not on file    Non-medical: Not on file  Tobacco Use  . Smoking status: Never Smoker  . Smokeless tobacco: Never Used  Substance and Sexual Activity  . Alcohol use: No  . Drug use: No  . Sexual activity: Yes  Lifestyle  . Physical activity:    Days per week: Not on file    Minutes per session: Not on file  . Stress: Not on file  Relationships  . Social connections:    Talks on phone: Not on file    Gets together: Not on file    Attends religious service: Not on file    Active member of club or organization: Not on file    Attends meetings of clubs or organizations: Not on file    Relationship status: Not on file  . Intimate partner violence:    Fear of current or ex partner: Not on file    Emotionally abused: Not on file    Physically abused: Not  on file    Forced sexual activity: Not on file  Other Topics Concern  . Not on file  Social History Narrative  . Not on file    Current Outpatient Medications on File Prior to Visit  Medication Sig Dispense Refill  . atorvastatin (LIPITOR) 20 MG tablet Take 1 tablet (20 mg total) by mouth at bedtime. 90 tablet 0  . cetirizine (ZYRTEC) 10 MG tablet TAKE 1 TABLET BY MOUTH AT BEDTIME 30 tablet 3  . dexlansoprazole (DEXILANT) 60 MG capsule Take 1 capsule (60 mg total) by mouth daily. 90 capsule 1  . diclofenac (VOLTAREN) 50 MG EC tablet Take 1 tablet by mouth twice daily 60 tablet 2  . DULoxetine (CYMBALTA) 60 MG capsule TAKE 1 CAPSULE BY MOUTH TWICE DAILY 60 capsule 3  . fluticasone (FLONASE) 50 MCG/ACT nasal spray USE 2 SPRAY(S) IN EACH NOSTRIL ONCE DAILY 16 g 3  . furosemide (LASIX) 80 MG tablet TAKE 1 TABLET BY MOUTH ONCE  DAILY 30 tablet 3  . HYDROcodone-acetaminophen (NORCO) 10-325 MG tablet Take 1 tablet by mouth every 6 (six) hours as needed. 120 tablet 0  . nebivolol (BYSTOLIC) 5 MG tablet Take 1 tablet (5 mg total) by mouth daily. 90 tablet 0  . nortriptyline (PAMELOR) 10 MG capsule TAKE 2 CAPSULES BY MOUTH AT BEDTIME 60 capsule 2  . zolpidem (AMBIEN) 5 MG tablet 1 tablet po QHS prn 30 tablet 0   No current facility-administered medications on file prior to visit.     Allergies  Allergen Reactions  . Tramadol Nausea Only    Family History  Problem Relation Age of Onset  . Hypertension Mother   . Alzheimer's disease Mother   . Diabetes Father   . Breast cancer Sister 81  . Anesthesia problems Neg Hx   . Hypotension Neg Hx   . Malignant hyperthermia Neg Hx   . Pseudochol deficiency Neg Hx   . Colon cancer Neg Hx   . Esophageal cancer Neg Hx   . Stomach cancer Neg Hx   . Rectal cancer Neg Hx   . Thyroid disease Neg Hx     Ht 5\' 3"  (1.6 m)   LMP 02/09/2017   BMI 45.56 kg/m    Review of Systems Pt reports slight swelling at the ant neck.  She says Ozempic causes nausea.      Objective:   Physical Exam VITAL SIGNS:  See vs page GENERAL: no distress Pulses: dorsalis pedis intact bilat.   MSK: no deformity of the feet.   CV: trace bilat leg edema.   Skin:  no ulcer on the feet.  normal color and temp on the feet.   Neuro: sensation is intact to touch on the feet.    Lab Results  Component Value Date   HGBA1C 11.2 (A) 05/28/2018   Lab Results  Component Value Date   CREATININE 1.36 (H) 12/05/2017   BUN 24 12/05/2017   NA 135 12/05/2017   K 4.1 12/05/2017   CL 98 12/05/2017   CO2 23 12/05/2017      Assessment & Plan:  Insulin-requiring type 2 DM: worse Renal insuff: This limits rx options MNG: recheck [place] Nausea: This limits Ozempic dosage.    Patient Instructions  check your blood sugar twice a day.  vary the time of day when you check, between before the 3 meals, and  at bedtime.  also check if you have symptoms of your blood sugar being too high or too low.  please  keep a record of the readings and bring it to your next appointment here (or you can bring the meter itself).  You can write it on any piece of paper.  please call us sooner if your blood sugar goes below 70, or if you have a lot of readings over 200.   please increase the Basaglar to 140 units each morning, and:  Please reduce the Ozempic to 0.5 mg weekly. Please call or message [place] next week, to tell us how the blood sugar is doing.   Please come back for a follow-up appointment in 2 months.

## 2018-06-03 ENCOUNTER — Other Ambulatory Visit: Payer: Self-pay | Admitting: Medical

## 2018-06-03 NOTE — Telephone Encounter (Signed)
Is this okay to refill? 

## 2018-06-03 NOTE — Progress Notes (Signed)
[facility] Summary Report / Loop Recorder 

## 2018-06-04 ENCOUNTER — Other Ambulatory Visit: Payer: Self-pay

## 2018-06-04 ENCOUNTER — Encounter: Payer: Self-pay | Admitting: Physical Medicine & Rehabilitation

## 2018-06-04 ENCOUNTER — Telehealth: Payer: Self-pay | Admitting: Cardiovascular Disease

## 2018-06-04 ENCOUNTER — Encounter
Payer: BC Managed Care – PPO | Attending: Physical Medicine & Rehabilitation | Admitting: Physical Medicine & Rehabilitation

## 2018-06-04 DIAGNOSIS — M160 Bilateral primary osteoarthritis of hip: Secondary | ICD-10-CM | POA: Diagnosis not present

## 2018-06-04 DIAGNOSIS — F418 Other specified anxiety disorders: Secondary | ICD-10-CM | POA: Insufficient documentation

## 2018-06-04 DIAGNOSIS — Z809 Family history of malignant neoplasm, unspecified: Secondary | ICD-10-CM | POA: Insufficient documentation

## 2018-06-04 DIAGNOSIS — I10 Essential (primary) hypertension: Secondary | ICD-10-CM | POA: Insufficient documentation

## 2018-06-04 DIAGNOSIS — Z8249 Family history of ischemic heart disease and other diseases of the circulatory system: Secondary | ICD-10-CM | POA: Insufficient documentation

## 2018-06-04 DIAGNOSIS — G8929 Other chronic pain: Secondary | ICD-10-CM | POA: Insufficient documentation

## 2018-06-04 DIAGNOSIS — E119 Type 2 diabetes mellitus without complications: Secondary | ICD-10-CM | POA: Insufficient documentation

## 2018-06-04 DIAGNOSIS — I471 Supraventricular tachycardia: Secondary | ICD-10-CM | POA: Insufficient documentation

## 2018-06-04 DIAGNOSIS — G894 Chronic pain syndrome: Secondary | ICD-10-CM

## 2018-06-04 DIAGNOSIS — M25551 Pain in right hip: Secondary | ICD-10-CM | POA: Insufficient documentation

## 2018-06-04 DIAGNOSIS — M25552 Pain in left hip: Secondary | ICD-10-CM | POA: Insufficient documentation

## 2018-06-04 DIAGNOSIS — Z833 Family history of diabetes mellitus: Secondary | ICD-10-CM | POA: Insufficient documentation

## 2018-06-04 DIAGNOSIS — G4730 Sleep apnea, unspecified: Secondary | ICD-10-CM | POA: Insufficient documentation

## 2018-06-04 MED ORDER — NORTRIPTYLINE HCL 25 MG PO CAPS: 25.0000 mg | ORAL_CAPSULE | Freq: Every day | ORAL | 2 refills | Status: DC

## 2018-06-04 MED ORDER — HYDROCODONE-ACETAMINOPHEN 10-325 MG PO TABS: 1.0000 | ORAL_TABLET | Freq: Four times a day (QID) | ORAL | 0 refills | Status: DC | PRN

## 2018-06-04 NOTE — Patient Instructions (Signed)
PLEASE FEEL FREE TO CALL OUR OFFICE WITH ANY PROBLEMS OR QUESTIONS ([phone])      

## 2018-06-04 NOTE — Telephone Encounter (Signed)
New message  Pt c/o medication issue:  1. Name of Medication: Bystolic   2. How are you currently taking this medication (dosage and times per day)? Once a day  3. Are you having a reaction (difficulty breathing--STAT)? No   4. What is your medication issue? Patient would like to see if there is a cheaper medication that she can take in this place.

## 2018-06-04 NOTE — Progress Notes (Signed)
Subjective:    Patient ID: [name], female    DOB: [birth year], 58 y.o.   MRN: [id number]  HPI   Mr. [name] is here in follow-up of her chronic pain.  She has ongoing pain in her low back and both hips.  She states that things have been going fairly well and that she continues just to deal with her pain.  She does note that since she is been out of work last 2 weeks that her pain levels are significantly better.  She has been able to rest a bit more.  I asked her if she is getting some exercise and she is to a certain extent.  She remains on hydrocodone 10/325 1 q. 6 to 8 hours/day.  She still has 19 left from her last prescription so often has been able just to use 3 tablets/day.  She tried trazodone for sleep per her primary physician but found that the medication made her feel sluggish in the morning so she went back to her Ambien.  She is taking nortriptyline for pain at night as well as to help with sleep but did make any changes with this.  Pain Inventory Average Pain 7 Pain Right Now 5 My pain is intermittent, sharp and stabbing  In the last 24 hours, has pain interfered with the following? General activity 4 Relation with others 5 Enjoyment of life 5 What TIME of day is your pain at its worst? evening Sleep (in general) Fair  Pain is worse with: walking and standing Pain improves with: rest, heat/ice and medication Relief from Meds: 6  Mobility walk without assistance ability to climb steps?  yes do you drive?  yes  Function employed # of hrs/week 40  Neuro/Psych spasms dizziness  Prior Studies Any changes since last visit?  no  Physicians involved in your care Any changes since last visit?  no   Family History  Problem Relation Age of Onset  . Hypertension Mother   . Alzheimer's disease Mother   . Diabetes Father   . Breast cancer Sister 81  . Anesthesia problems Neg Hx   . Hypotension Neg Hx   . Malignant hyperthermia Neg Hx   . Pseudochol  deficiency Neg Hx   . Colon cancer Neg Hx   . Esophageal cancer Neg Hx   . Stomach cancer Neg Hx   . Rectal cancer Neg Hx   . Thyroid disease Neg Hx    Social History   Socioeconomic History  . Marital status: Divorced    Spouse name: Not on file  . Number of children: 0  . Years of education: Not on file  . Highest education level: Not on file  Occupational History  . Occupation: TEACHER ASSISTANT  Social Needs  . Financial resource strain: Not on file  . Food insecurity:    Worry: Not on file    Inability: Not on file  . Transportation needs:    Medical: Not on file    Non-medical: Not on file  Tobacco Use  . Smoking status: Never Smoker  . Smokeless tobacco: Never Used  Substance and Sexual Activity  . Alcohol use: No  . Drug use: No  . Sexual activity: Yes  Lifestyle  . Physical activity:    Days per week: Not on file    Minutes per session: Not on file  . Stress: Not on file  Relationships  . Social connections:    Talks on phone: Not on file  Gets together: Not on file    Attends religious service: Not on file    Active member of club or organization: Not on file    Attends meetings of clubs or organizations: Not on file    Relationship status: Not on file  Other Topics Concern  . Not on file  Social History Narrative  . Not on file   Past Surgical History:  Procedure Laterality Date  . ANAL EXAMINATION UNDER ANESTHESIA  02/21/11   anal fistula  . BREAST SURGERY  patient does not remember date of procedure   pull fluid off lft br  . ELECTROPHYSIOLOGIC STUDY N/A 05/05/2015   Procedure: SVT Ablation;  Surgeon: Theresa Meredith Leeds, MD;  Location: [facility];  Service: Cardiovascular;  Laterality: N/A;  . EP IMPLANTABLE DEVICE N/A 01/30/2016   Procedure: Loop Recorder Insertion;  Surgeon: Theresa Klein, MD;  Location: [facility];  Service: Cardiovascular;  Laterality: N/A;  . INCISE AND DRAIN ABCESS     abscess on right thigh and  buttock  . KNEE ARTHROSCOPY     left  . SHOULDER SURGERY  04/14/09   right   Past Medical History:  Diagnosis Date  . Abscess    increased drainage from abscess on buttock  . Anal fistula   . Anxiety   . Bilateral hip pain 05/27/2015  . Chronic pain syndrome 05/27/2015  . Depression    sees Dr. Barrie [name]  . Diabetes mellitus without complication ([place])   . Hypertension   . Sleep apnea    2008- sleep study, neg. for sleep apnea   . SVT (supraventricular tachycardia) ([facility])    Temp 98.2 F (36.8 C) Comment: stated last pm  Ht 5\' 3"  (1.6 m)   Wt 251 lb (113.9 kg)   LMP 02/09/2017   BMI 44.46 kg/m   Opioid Risk Score:   Fall Risk Score:  `1  Depression screen PHQ 2/9  Depression screen [facility] 2/9 10/28/2017 10/02/2017 07/15/2017 05/23/2017 04/12/2017 03/21/2017 12/24/2016  Decreased Interest 0 0 0 1 3 0 1  Down, Depressed, Hopeless 0 3 0 1 2 0 1  PHQ - 2 Score 0 3 0 2 5 0 2  Altered sleeping - 1 - 0 3 - -  Tired, decreased energy - 0 - 1 3 - -  Change in appetite - 1 - 0 2 - -  Feeling bad or failure about yourself  - 0 - 0 1 - -  Trouble concentrating - 0 - 0 1 - -  Moving slowly or fidgety/restless - 0 - 0 0 - -  Suicidal thoughts - 0 - 0 0 - -  PHQ-9 Score - 5 - 3 15 - -  Difficult doing work/chores - Somewhat difficult - - - - -  Some recent data might be hidden     Review of Systems  Constitutional: Negative.   HENT: Negative.   Eyes: Negative.   Respiratory: Negative.   Cardiovascular: Negative.   Gastrointestinal: Negative.   Endocrine: Negative.   Genitourinary: Negative.   Musculoskeletal: Positive for arthralgias, back pain and myalgias.  Skin: Negative.   Allergic/Immunologic: Negative.   Neurological: Positive for dizziness.  Hematological: Negative.   Psychiatric/Behavioral: Negative.   All other systems reviewed and are negative.       Assessment & Plan:    1. Chronic B/l Hip pain L>R---Mod OA left hip by MRI and mild OA right hip by MRI  --continue hydrocodone 10/325 for pain q6 prn #  120to be filled 06/09/2018 -We Theresa continue the controlled substance monitoring program, this consists of regular clinic visits, examinations, routine drug screening, pill counts as well as use of [place] Controlled Substance Reporting System. [place] was [place] today.   -Medication was refilled and a second prescription was sent to the patient's pharmacy for next month.    -Continue extra Tylenol within recommended limitations -Consider greater troch injections when I'm able to lay hands on her in person. 2. GERD---protonix -senokot s for constipation. 3. Morbid obesity diet has been reviewed         She's doing a better job and lowering her blood sugars 4. Sleep: pamelor---increase to 25mg  qhs for sleep, pain.   -If this proves helpful for her sleep, we can consider discontinuing her Ambien.  .  Follow-up with nurse practitioner in about 5 weeks. 15 minutes of face to face patient care time were spent during this visit. All questions were encouraged and answered.

## 2018-06-04 NOTE — Telephone Encounter (Signed)
Pt given 2 month supply of Bystolic 5mg  and will call and make an appt with Dr. Sallyanne Kuster in the next several weeks to talk with him about making a change.... pt pays 24.00 for a 1 month supply at [facility] and it is hard for her on her budget. Pt says BP has been good at her PMD office but not sure of the readings.

## 2018-06-12 ENCOUNTER — Ambulatory Visit: Payer: BC Managed Care – PPO

## 2018-06-25 ENCOUNTER — Other Ambulatory Visit: Payer: Self-pay | Admitting: Endocrinology

## 2018-06-26 ENCOUNTER — Ambulatory Visit (INDEPENDENT_AMBULATORY_CARE_PROVIDER_SITE_OTHER): Payer: BC Managed Care – PPO | Admitting: *Deleted

## 2018-06-26 ENCOUNTER — Other Ambulatory Visit: Payer: Self-pay

## 2018-06-26 DIAGNOSIS — R55 Syncope and collapse: Secondary | ICD-10-CM | POA: Diagnosis not present

## 2018-06-26 DIAGNOSIS — IMO0002 Reserved for concepts with insufficient information to code with codable children: Secondary | ICD-10-CM

## 2018-06-26 DIAGNOSIS — E1165 Type 2 diabetes mellitus with hyperglycemia: Secondary | ICD-10-CM

## 2018-06-26 DIAGNOSIS — E118 Type 2 diabetes mellitus with unspecified complications: Principal | ICD-10-CM

## 2018-06-26 MED ORDER — SEMAGLUTIDE(0.25 OR 0.5MG/DOS) 2 MG/1.5ML ~~LOC~~ SOPN: 0.5000 mg | PEN_INJECTOR | SUBCUTANEOUS | 11 refills | Status: DC

## 2018-06-27 ENCOUNTER — Telehealth: Payer: Self-pay | Admitting: Cardiovascular Disease

## 2018-06-27 NOTE — Telephone Encounter (Signed)
Smart phone/MyChart/pre reg completed. 06-27-18 ST Patient consents to phone visit. 06-27-18 ST

## 2018-06-28 LAB — CUP PACEART REMOTE DEVICE CHECK
Date Time Interrogation Session: 20200418161407
Implantable Pulse Generator Implant Date: 20171120

## 2018-07-01 ENCOUNTER — Telehealth: Payer: BC Managed Care – PPO | Admitting: Cardiovascular Disease

## 2018-07-03 NOTE — Progress Notes (Signed)
[facility] Summary Report / Loop Recorder 

## 2018-07-09 ENCOUNTER — Encounter
Payer: BC Managed Care – PPO | Attending: Physical Medicine & Rehabilitation | Admitting: Physical Medicine & Rehabilitation

## 2018-07-09 ENCOUNTER — Encounter: Payer: Self-pay | Admitting: Physical Medicine & Rehabilitation

## 2018-07-09 ENCOUNTER — Other Ambulatory Visit: Payer: Self-pay

## 2018-07-09 ENCOUNTER — Other Ambulatory Visit: Payer: Self-pay | Admitting: Medical

## 2018-07-09 VITALS — Ht 63.0 in | Wt 251.0 lb

## 2018-07-09 DIAGNOSIS — G894 Chronic pain syndrome: Secondary | ICD-10-CM

## 2018-07-09 DIAGNOSIS — Z8249 Family history of ischemic heart disease and other diseases of the circulatory system: Secondary | ICD-10-CM | POA: Insufficient documentation

## 2018-07-09 DIAGNOSIS — G8929 Other chronic pain: Secondary | ICD-10-CM | POA: Insufficient documentation

## 2018-07-09 DIAGNOSIS — F418 Other specified anxiety disorders: Secondary | ICD-10-CM | POA: Insufficient documentation

## 2018-07-09 DIAGNOSIS — I471 Supraventricular tachycardia: Secondary | ICD-10-CM | POA: Insufficient documentation

## 2018-07-09 DIAGNOSIS — M160 Bilateral primary osteoarthritis of hip: Secondary | ICD-10-CM

## 2018-07-09 DIAGNOSIS — I10 Essential (primary) hypertension: Secondary | ICD-10-CM | POA: Insufficient documentation

## 2018-07-09 DIAGNOSIS — Z809 Family history of malignant neoplasm, unspecified: Secondary | ICD-10-CM | POA: Insufficient documentation

## 2018-07-09 DIAGNOSIS — M25551 Pain in right hip: Secondary | ICD-10-CM | POA: Insufficient documentation

## 2018-07-09 DIAGNOSIS — M7632 Iliotibial band syndrome, left leg: Secondary | ICD-10-CM

## 2018-07-09 DIAGNOSIS — M7062 Trochanteric bursitis, left hip: Secondary | ICD-10-CM

## 2018-07-09 DIAGNOSIS — M7061 Trochanteric bursitis, right hip: Secondary | ICD-10-CM

## 2018-07-09 DIAGNOSIS — M25552 Pain in left hip: Secondary | ICD-10-CM | POA: Insufficient documentation

## 2018-07-09 DIAGNOSIS — Z833 Family history of diabetes mellitus: Secondary | ICD-10-CM | POA: Insufficient documentation

## 2018-07-09 DIAGNOSIS — E119 Type 2 diabetes mellitus without complications: Secondary | ICD-10-CM | POA: Insufficient documentation

## 2018-07-09 DIAGNOSIS — G4730 Sleep apnea, unspecified: Secondary | ICD-10-CM | POA: Insufficient documentation

## 2018-07-09 MED ORDER — HYDROCODONE-ACETAMINOPHEN 10-325 MG PO TABS: 1.0000 | ORAL_TABLET | Freq: Four times a day (QID) | ORAL | 0 refills | Status: DC | PRN

## 2018-07-09 NOTE — Telephone Encounter (Signed)
Is this okay to refill? 

## 2018-07-09 NOTE — Progress Notes (Signed)
Subjective:    Patient ID: [name], female    DOB: Nov 27, 1960, 58 y.o.   MRN: [id number]  HPI   Due to national recommendations of social distancing because of COVID 42, an audio/video tele-health visit is felt to be the most appropriate encounter for this patient at this time. See MyChart message from today for the patient's consent to a tele-health encounter with [facility]. This is a follow up telephone visit for the patient who is at home. MD is at office.    I am meeting with the patient today regarding her chronic pain.  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  She is sitting a lot as a result working on the computer.  She is trying to walk and get some exercise otherwise.  Her pain levels are fairly stable.  She remains on the hydrocodone.  She does notice some tightness in her hamstrings and buttocks areas when she first wakes up in the morning.  Sometimes it takes a while before they loosen up and do more comfortable.  She typically does her stretches at nighttime.  Pain Inventory Average Pain 6 Pain Right Now 8 My pain is dull, aching and throbbing  In the last 24 hours, has pain interfered with the following? General activity 7 Relation with others 6 Enjoyment of life 7 What TIME of day is your pain at its worst? evening Sleep (in general) Fair  Pain is worse with: walking and some activites Pain improves with: rest, heat/ice and medication Relief from Meds: 6  Mobility walk without assistance ability to climb steps?  yes do you drive?  yes  Function employed # of hrs/week 40  Neuro/Psych trouble walking anxiety  Prior Studies Any changes since last visit?  no  Physicians involved in your care Any changes since last visit?  no   Family History  Problem Relation Age of Onset  . Hypertension Mother    . Alzheimer's disease Mother   . Diabetes Father   . Breast cancer Sister 76  . Anesthesia problems Neg Hx   . Hypotension Neg Hx   . Malignant hyperthermia Neg Hx   . Pseudochol deficiency Neg Hx   . Colon cancer Neg Hx   . Esophageal cancer Neg Hx   . Stomach cancer Neg Hx   . Rectal cancer Neg Hx   . Thyroid disease Neg Hx    Social History   Socioeconomic History  . Marital status: Divorced    Spouse name: Not on file  . Number of children: 0  . Years of education: Not on file  . Highest education level: Not on file  Occupational History  . Occupation: TEACHER ASSISTANT  Social Needs  . Financial resource strain: Not on file  . Food insecurity:    Worry: Not on file    Inability: Not on file  . Transportation needs:    Medical: Not on file    Non-medical: Not on file  Tobacco Use  . Smoking status: Never Smoker  . Smokeless tobacco: Never Used  Substance and Sexual Activity  . Alcohol use: No  . Drug use: No  . Sexual activity: Yes  Lifestyle  . Physical activity:    Days per week: Not on file    Minutes per session: Not on file  . Stress: Not on file  Relationships  .  Social connections:    Talks on phone: Not on file    Gets together: Not on file    Attends religious service: Not on file    Active member of club or organization: Not on file    Attends meetings of clubs or organizations: Not on file    Relationship status: Not on file  Other Topics Concern  . Not on file  Social History Narrative  . Not on file   Past Surgical History:  Procedure Laterality Date  . ANAL EXAMINATION UNDER ANESTHESIA  02/21/11   anal fistula  . BREAST SURGERY  patient does not remember date of procedure   pull fluid off lft br  . ELECTROPHYSIOLOGIC STUDY N/A 05/05/2015   Procedure: SVT Ablation;  Surgeon: Will Meredith Leeds, MD;  Location: [facility];  Service: Cardiovascular;  Laterality: N/A;  . EP IMPLANTABLE DEVICE N/A 01/30/2016   Procedure: Loop  Recorder Insertion;  Surgeon: Sanda Klein, MD;  Location: [facility];  Service: Cardiovascular;  Laterality: N/A;  . INCISE AND DRAIN ABCESS     abscess on right thigh and buttock  . KNEE ARTHROSCOPY     left  . SHOULDER SURGERY  04/14/09   right   Past Medical History:  Diagnosis Date  . Abscess    increased drainage from abscess on buttock  . Anal fistula   . Anxiety   . Bilateral hip pain 05/27/2015  . Chronic pain syndrome 05/27/2015  . Depression    sees [name]  . Diabetes mellitus without complication ([place])   . Hypertension   . Sleep apnea    2008- sleep study, neg. for sleep apnea   . SVT (supraventricular tachycardia) (HCC)    Ht 5\' 3"  (1.6 m)   Wt 251 lb (113.9 kg)   LMP 02/09/2017   BMI 44.46 kg/m   Opioid Risk Score:   Fall Risk Score:  `1  Depression screen PHQ 2/9  Depression screen [facility] 2/9 10/28/2017 10/02/2017 07/15/2017 05/23/2017 04/12/2017 03/21/2017 12/24/2016  Decreased Interest 0 0 0 1 3 0 1  Down, Depressed, Hopeless 0 3 0 1 2 0 1  PHQ - 2 Score 0 3 0 2 5 0 2  Altered sleeping - 1 - 0 3 - -  Tired, decreased energy - 0 - 1 3 - -  Change in appetite - 1 - 0 2 - -  Feeling bad or failure about yourself  - 0 - 0 1 - -  Trouble concentrating - 0 - 0 1 - -  Moving slowly or fidgety/restless - 0 - 0 0 - -  Suicidal thoughts - 0 - 0 0 - -  PHQ-9 Score - 5 - 3 15 - -  Difficult doing work/chores - Somewhat difficult - - - - -  Some recent data might be hidden     Review of Systems  Constitutional: Negative.   HENT: Negative.   Eyes: Negative.   Respiratory: Negative.   Cardiovascular: Negative.   Gastrointestinal: Negative.   Endocrine: Negative.   Genitourinary: Negative.   Musculoskeletal: Positive for arthralgias, back pain, gait problem and myalgias.  Skin: Negative.   Allergic/Immunologic: Negative.   Hematological: Negative.   Psychiatric/Behavioral: Negative.   All other systems reviewed and are negative.             Assessment & Plan:  1. 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 [place] was reviewed today.   -Medication was refilled and a second prescription was sent to the patient's pharmacy for next month.    -Continue extra Tylenol within recommended limitations -Reviewed hamstring stretches today        -May be a candidate for greater trochanter injections at some point if pain persists 2. GERD---protonix -senokot s for constipation. 3. Morbid obesity diet has been reviewed              She's doing a better job and lowering her blood sugars 4. Sleep: Improved with Pamelor 25 mg nightly.        -Consider weaning off Ambien.  Continue Ambien for now  .  11 minutes of tele-visit time was spent with this patient today. Follow up in 2 months with nurse practitioner

## 2018-07-24 ENCOUNTER — Other Ambulatory Visit: Payer: Self-pay | Admitting: Cardiovascular Disease

## 2018-07-25 ENCOUNTER — Telehealth: Payer: Self-pay | Admitting: Cardiovascular Disease

## 2018-07-25 ENCOUNTER — Encounter: Payer: Self-pay | Admitting: Endocrinology

## 2018-07-25 NOTE — Telephone Encounter (Signed)
New Message    Patient calling the office for samples of medication:   1.  What medication and dosage are you requesting samples for? Brillinta 5mg   2.  Are you currently out of this medication? Pt is almost out

## 2018-07-25 NOTE — Telephone Encounter (Signed)
Spoke with pt who was requesting samples for Bystolic 5 mg. Informed pt that were are currently out of samples at this time. Pt voiced understanding.

## 2018-07-28 ENCOUNTER — Other Ambulatory Visit: Payer: BC Managed Care – PPO

## 2018-07-28 ENCOUNTER — Encounter: Payer: BC Managed Care – PPO | Admitting: Endocrinology

## 2018-07-28 ENCOUNTER — Other Ambulatory Visit: Payer: Self-pay

## 2018-07-29 ENCOUNTER — Other Ambulatory Visit: Payer: Self-pay

## 2018-07-29 ENCOUNTER — Ambulatory Visit (INDEPENDENT_AMBULATORY_CARE_PROVIDER_SITE_OTHER): Payer: BC Managed Care – PPO | Admitting: *Deleted

## 2018-07-29 DIAGNOSIS — R002 Palpitations: Secondary | ICD-10-CM | POA: Diagnosis not present

## 2018-07-31 LAB — CUP PACEART REMOTE DEVICE CHECK
Date Time Interrogation Session: 20200521173810
Implantable Pulse Generator Implant Date: 20171120

## 2018-08-07 ENCOUNTER — Other Ambulatory Visit: Payer: Self-pay | Admitting: Medical

## 2018-08-07 NOTE — Progress Notes (Signed)
[facility] Summary Report / Loop Recorder 

## 2018-08-07 NOTE — Telephone Encounter (Signed)
Is this ok to refill?  

## 2018-08-11 ENCOUNTER — Ambulatory Visit: Payer: BC Managed Care – PPO | Admitting: Endocrinology

## 2018-08-13 ENCOUNTER — Ambulatory Visit (INDEPENDENT_AMBULATORY_CARE_PROVIDER_SITE_OTHER): Payer: BC Managed Care – PPO | Admitting: Medical

## 2018-08-13 ENCOUNTER — Encounter: Payer: Self-pay | Admitting: Medical

## 2018-08-13 ENCOUNTER — Other Ambulatory Visit: Payer: Self-pay

## 2018-08-13 VITALS — Ht 63.0 in | Wt 250.0 lb

## 2018-08-13 DIAGNOSIS — F410 Panic disorder [episodic paroxysmal anxiety] without agoraphobia: Secondary | ICD-10-CM | POA: Diagnosis not present

## 2018-08-13 DIAGNOSIS — F419 Anxiety disorder, unspecified: Secondary | ICD-10-CM | POA: Insufficient documentation

## 2018-08-13 DIAGNOSIS — R0602 Shortness of breath: Secondary | ICD-10-CM | POA: Diagnosis not present

## 2018-08-13 MED ORDER — CARVEDILOL 3.125 MG PO TABS: 3.1250 mg | ORAL_TABLET | Freq: Two times a day (BID) | ORAL | 3 refills | Status: DC

## 2018-08-13 NOTE — Progress Notes (Signed)
This visit type was conducted due to national recommendations for restrictions regarding the COVID-19 Pandemic (e.g. social distancing) in an effort to limit this patient's exposure and mitigate transmission in our community.  Due to their co-morbid illnesses, this patient is at least at moderate risk for complications without adequate follow up.  This format is felt to be most appropriate for this patient at this time.    Documentation for virtual audio and video telecommunications through Zoom encounter:  The patient was located at home. The provider was located in the office. The patient did consent to this visit and is aware of possible charges through their insurance for this visit.  The other persons participating in this telemedicine service were none. Time spent on call was 15  minutes and in review of previous records >20 minutes total.  This virtual service is not related to other E/M service within previous 7 days.subjective: Chief Complaint  Patient presents with  . SOB    SOB still having, anxiety is getting worse    Virtual consult today for SOB she attributes to anxiety.   She notes that she walks regular for exercise without any problems.  Has cardiologist and sees cardiology regular for electrophysiology monitoring.   No recent palpations, no chest pain, no edema, no weight gain.  She feels this is anxiety related.   The last time she had brief episodes of SOB like this was months ago when we discussed this.   She continues on Cymbalta for pain and mood, uses lorazepam prn, but only has limited supply of lorazepam.  The lorazepam helps calm her nerves and SOB and anxiety attacks.   Would like to possibly used prn.   In recent months the coronavirus issues in the news, and now the police brutality and racial issues in the news is overly stressful.   She already lives in fear of ex-husband who also lives here in [place].   He calls her phone occasionally although she doesn't  answer.   She feels like the tipping point recently was when she and her sister went out of town to a funeral of her cousin age Loistine Simas who just died in a tragic car accident.   The moment she got out of the car at the funeral she felt panicky and had a SOB attack that lasted several minutes.   She has had daily panic attacks since.    She notes that her sister has given her a paper bag to use for hyperventilation when she has had some of these panic attacks.  She continues to see her pastor/Counsellor monthly.   No other aggravating or relieving factors. No other complaint.   Past Medical History:  Diagnosis Date  . Abscess    increased drainage from abscess on buttock  . Anal fistula   . Anxiety   . Bilateral hip pain 05/27/2015  . Chronic pain syndrome 05/27/2015  . Depression    sees [name]  . Diabetes mellitus without complication ([place])   . Hypertension   . Sleep apnea    2008- sleep study, neg. for sleep apnea   . SVT (supraventricular tachycardia) (HCC)    ROS as in subjective    Objective: Ht 5\' 3"  (1.6 m)   Wt 250 lb (113.4 kg)   LMP 02/09/2017   BMI 44.29 kg/m    Gen: wd, wn ,nad Psych: pleasant, answers questions appropriately     Assessment: Encounter Diagnoses  Name Primary?  Marland Kitchen Anxiety Yes  .  SOB (shortness of breath)   . Panic attack      Plan: Discussed her concerns.  C/t Cymbalta.  We will increase to Lorazepam BID, and when she runs out of current supply in 2 weeks, I will change to #60, BID dosing prn.   advised she change her counseling to weekly for now.   Discussed ways to cope with her anxiety.   I reviewed recent cariology and electrophysiology notes.   I don't suspect a physical, cardiopulmonary cause of her symptoms at this time.    [name] was seen today for sob.  Diagnoses and all orders for this visit:  Anxiety  SOB (shortness of breath)  Panic attack   F/u 2weeks

## 2018-08-21 ENCOUNTER — Other Ambulatory Visit: Payer: Self-pay | Admitting: Medical

## 2018-08-25 ENCOUNTER — Telehealth: Payer: Self-pay

## 2018-08-25 NOTE — Telephone Encounter (Signed)
Pt called to request a new prescription be sent to the pharmacy to treat yeast symptoms include vaginal itch, cottage cheese discharge. Pt started a new medication (heart medicine) last week

## 2018-08-25 NOTE — Telephone Encounter (Signed)
I recommend offering a visit here or with her gynecologist to confirm diagnosis and provide appropriate treatment

## 2018-08-26 ENCOUNTER — Other Ambulatory Visit: Payer: BC Managed Care – PPO

## 2018-08-26 NOTE — Telephone Encounter (Signed)
Pt will do a virtual on 08/28/18

## 2018-08-26 NOTE — Telephone Encounter (Signed)
She is coming in I misread the in office visit

## 2018-08-26 NOTE — Telephone Encounter (Signed)
She really needs a pelvic exam by someone here or her gynecologist regarding yeast infection

## 2018-08-28 ENCOUNTER — Ambulatory Visit: Payer: BC Managed Care – PPO | Admitting: Medical

## 2018-09-01 ENCOUNTER — Other Ambulatory Visit: Payer: Self-pay | Admitting: Medical

## 2018-09-01 ENCOUNTER — Ambulatory Visit (INDEPENDENT_AMBULATORY_CARE_PROVIDER_SITE_OTHER): Payer: BC Managed Care – PPO | Admitting: *Deleted

## 2018-09-01 ENCOUNTER — Other Ambulatory Visit: Payer: Self-pay | Admitting: Physical Medicine & Rehabilitation

## 2018-09-01 DIAGNOSIS — R002 Palpitations: Secondary | ICD-10-CM | POA: Diagnosis not present

## 2018-09-01 DIAGNOSIS — R609 Edema, unspecified: Secondary | ICD-10-CM

## 2018-09-01 DIAGNOSIS — G894 Chronic pain syndrome: Secondary | ICD-10-CM

## 2018-09-01 DIAGNOSIS — I471 Supraventricular tachycardia: Secondary | ICD-10-CM

## 2018-09-01 DIAGNOSIS — Z79899 Other long term (current) drug therapy: Secondary | ICD-10-CM

## 2018-09-01 DIAGNOSIS — Z5181 Encounter for therapeutic drug level monitoring: Secondary | ICD-10-CM

## 2018-09-01 DIAGNOSIS — M160 Bilateral primary osteoarthritis of hip: Secondary | ICD-10-CM

## 2018-09-03 ENCOUNTER — Other Ambulatory Visit: Payer: Self-pay | Admitting: Medical

## 2018-09-03 LAB — CUP PACEART REMOTE DEVICE CHECK
Date Time Interrogation Session: 20200623183845
Implantable Pulse Generator Implant Date: 20171120

## 2018-09-03 MED ORDER — LORAZEPAM 0.5 MG PO TABS: 0.5000 mg | ORAL_TABLET | Freq: Two times a day (BID) | ORAL | 0 refills | Status: DC | PRN

## 2018-09-04 ENCOUNTER — Other Ambulatory Visit: Payer: Self-pay | Admitting: Medical

## 2018-09-04 ENCOUNTER — Encounter: Payer: BC Managed Care – PPO | Admitting: Registered Nurse

## 2018-09-04 NOTE — Telephone Encounter (Signed)
Is this okay to refill? 

## 2018-09-05 ENCOUNTER — Other Ambulatory Visit: Payer: BC Managed Care – PPO

## 2018-09-05 NOTE — Telephone Encounter (Signed)
I received another refill request on Ambien.  I had referred to neurology regarding sleep issues.  When is here appt?

## 2018-09-08 ENCOUNTER — Other Ambulatory Visit: Payer: Self-pay | Admitting: Medical

## 2018-09-08 ENCOUNTER — Other Ambulatory Visit: Payer: Self-pay

## 2018-09-08 ENCOUNTER — Encounter: Payer: Self-pay | Admitting: Registered Nurse

## 2018-09-08 ENCOUNTER — Encounter: Payer: BC Managed Care – PPO | Attending: Physical Medicine & Rehabilitation | Admitting: Registered Nurse

## 2018-09-08 VITALS — BP 121/79 | HR 95 | Temp 97.5°F | Ht 63.0 in | Wt 256.0 lb

## 2018-09-08 DIAGNOSIS — M160 Bilateral primary osteoarthritis of hip: Secondary | ICD-10-CM | POA: Diagnosis not present

## 2018-09-08 DIAGNOSIS — E119 Type 2 diabetes mellitus without complications: Secondary | ICD-10-CM | POA: Insufficient documentation

## 2018-09-08 DIAGNOSIS — Z8249 Family history of ischemic heart disease and other diseases of the circulatory system: Secondary | ICD-10-CM | POA: Insufficient documentation

## 2018-09-08 DIAGNOSIS — I471 Supraventricular tachycardia: Secondary | ICD-10-CM | POA: Diagnosis not present

## 2018-09-08 DIAGNOSIS — F411 Generalized anxiety disorder: Secondary | ICD-10-CM

## 2018-09-08 DIAGNOSIS — F418 Other specified anxiety disorders: Secondary | ICD-10-CM | POA: Insufficient documentation

## 2018-09-08 DIAGNOSIS — Z833 Family history of diabetes mellitus: Secondary | ICD-10-CM | POA: Insufficient documentation

## 2018-09-08 DIAGNOSIS — Z5181 Encounter for therapeutic drug level monitoring: Secondary | ICD-10-CM | POA: Diagnosis not present

## 2018-09-08 DIAGNOSIS — M5416 Radiculopathy, lumbar region: Secondary | ICD-10-CM

## 2018-09-08 DIAGNOSIS — G8929 Other chronic pain: Secondary | ICD-10-CM | POA: Insufficient documentation

## 2018-09-08 DIAGNOSIS — M7061 Trochanteric bursitis, right hip: Secondary | ICD-10-CM

## 2018-09-08 DIAGNOSIS — Z809 Family history of malignant neoplasm, unspecified: Secondary | ICD-10-CM | POA: Diagnosis not present

## 2018-09-08 DIAGNOSIS — M7062 Trochanteric bursitis, left hip: Secondary | ICD-10-CM

## 2018-09-08 DIAGNOSIS — G4700 Insomnia, unspecified: Secondary | ICD-10-CM

## 2018-09-08 DIAGNOSIS — M79642 Pain in left hand: Secondary | ICD-10-CM

## 2018-09-08 DIAGNOSIS — I10 Essential (primary) hypertension: Secondary | ICD-10-CM | POA: Diagnosis not present

## 2018-09-08 DIAGNOSIS — G4730 Sleep apnea, unspecified: Secondary | ICD-10-CM | POA: Insufficient documentation

## 2018-09-08 DIAGNOSIS — Z79899 Other long term (current) drug therapy: Secondary | ICD-10-CM

## 2018-09-08 DIAGNOSIS — M25552 Pain in left hip: Secondary | ICD-10-CM | POA: Insufficient documentation

## 2018-09-08 DIAGNOSIS — M25551 Pain in right hip: Secondary | ICD-10-CM | POA: Insufficient documentation

## 2018-09-08 DIAGNOSIS — G894 Chronic pain syndrome: Secondary | ICD-10-CM

## 2018-09-08 MED ORDER — HYDROCODONE-ACETAMINOPHEN 10-325 MG PO TABS: 1.0000 | ORAL_TABLET | Freq: Four times a day (QID) | ORAL | 0 refills | Status: DC | PRN

## 2018-09-08 NOTE — Telephone Encounter (Signed)
I need her to be evaluated by a neurologist at [facility] as I can't keep prescribing Ambien for her, and she has never had a sleep study to fully eval her issues

## 2018-09-08 NOTE — Telephone Encounter (Signed)
Her referral was declined by neurology due to they do not treat DX.   I have put another referral in for [facility].

## 2018-09-08 NOTE — Progress Notes (Signed)
Subjective:    Patient ID: [name], female    DOB: 05/19/1960, 58 y.o.   MRN: [id number]  HPI: [name] is a 58 y.o. female who returns for follow up appointment for chronic pain and medication refill. She states her  pain is located in her lower back radiating into her bilateral hips L>R and left hand pain. Also reports increase intensity of left hand pain , it's onset a month ago and she denies falling. We will order a X-ray, she verbalizes understanding. She rates her  Pain 6. Her current exercise regime is walking and performing stretching exercises.  Ms. [place] Morphine equivalent is 40.00  MME.  Last UDS was Performed on 10/28/2017, it was consistent. Oral Swab was Performed today.    Pain Inventory Average Pain 8 Pain Right Now 6 My pain is sharp, dull and stabbing  In the last 24 hours, has pain interfered with the following? General activity 5 Relation with others 10 Enjoyment of life 10 What TIME of day is your pain at its worst? morning, evening Sleep (in general) Fair  Pain is worse with: walking, standing and some activites Pain improves with: rest, heat/ice and medication Relief from Meds: 6  Mobility walk without assistance do you drive?  yes transfers alone  Function employed # of hrs/week n/a I need assistance with the following:  meal prep and household duties  Neuro/Psych No problems in this area  Prior Studies n/a  Physicians involved in your care n/a   Family History  Problem Relation Age of Onset  . Hypertension Mother   . Alzheimer's disease Mother   . Diabetes Father   . Breast cancer Sister 51  . Anesthesia problems Neg Hx   . Hypotension Neg Hx   . Malignant hyperthermia Neg Hx   . Pseudochol deficiency Neg Hx   . Colon cancer Neg Hx   . Esophageal cancer Neg Hx   . Stomach cancer Neg Hx   . Rectal cancer Neg Hx   . Thyroid disease Neg Hx    Social History   Socioeconomic History  . Marital status:  Divorced    Spouse name: Not on file  . Number of children: 0  . Years of education: Not on file  . Highest education level: Not on file  Occupational History  . Occupation: TEACHER ASSISTANT  Social Needs  . Financial resource strain: Not on file  . Food insecurity    Worry: Not on file    Inability: Not on file  . Transportation needs    Medical: Not on file    Non-medical: Not on file  Tobacco Use  . Smoking status: Never Smoker  . Smokeless tobacco: Never Used  Substance and Sexual Activity  . Alcohol use: No  . Drug use: No  . Sexual activity: Yes  Lifestyle  . Physical activity    Days per week: Not on file    Minutes per session: Not on file  . Stress: Not on file  Relationships  . Social Herbalist on phone: Not on file    Gets together: Not on file    Attends religious service: Not on file    Active member of club or organization: Not on file    Attends meetings of clubs or organizations: Not on file    Relationship status: Not on file  Other Topics Concern  . Not on file  Social History Narrative  . Not on file   Past  Surgical History:  Procedure Laterality Date  . ANAL EXAMINATION UNDER ANESTHESIA  02/21/11   anal fistula  . BREAST SURGERY  patient does not remember date of procedure   pull fluid off lft br  . ELECTROPHYSIOLOGIC STUDY N/A 05/05/2015   Procedure: SVT Ablation;  Surgeon: Will Meredith Leeds, MD;  Location: [facility];  Service: Cardiovascular;  Laterality: N/A;  . EP IMPLANTABLE DEVICE N/A 01/30/2016   Procedure: Loop Recorder Insertion;  Surgeon: Sanda Klein, MD;  Location: [facility];  Service: Cardiovascular;  Laterality: N/A;  . INCISE AND DRAIN ABCESS     abscess on right thigh and buttock  . KNEE ARTHROSCOPY     left  . SHOULDER SURGERY  04/14/09   right   Past Medical History:  Diagnosis Date  . Abscess    increased drainage from abscess on buttock  . Anal fistula   . Anxiety   . Bilateral hip  pain 05/27/2015  . Chronic pain syndrome 05/27/2015  . Depression    sees [name]  . Diabetes mellitus without complication ([place])   . Hypertension   . Sleep apnea    2008- sleep study, neg. for sleep apnea   . SVT (supraventricular tachycardia) (HCC)    BP 121/79 (BP Location: Left Arm, Patient Position: Sitting, Cuff Size: Normal)   Pulse 95   Temp (!) 97.5 F (36.4 C)   Ht 5\' 3"  (1.6 m)   Wt 256 lb (116.1 kg)   LMP 02/09/2017   SpO2 97%   BMI 45.35 kg/m   Opioid Risk Score:   Fall Risk Score:  `1  Depression screen PHQ 2/9  Depression screen [facility] 2/9 10/28/2017 10/02/2017 07/15/2017 05/23/2017 04/12/2017 03/21/2017 12/24/2016  Decreased Interest 0 0 0 1 3 0 1  Down, Depressed, Hopeless 0 3 0 1 2 0 1  PHQ - 2 Score 0 3 0 2 5 0 2  Altered sleeping - 1 - 0 3 - -  Tired, decreased energy - 0 - 1 3 - -  Change in appetite - 1 - 0 2 - -  Feeling bad or failure about yourself  - 0 - 0 1 - -  Trouble concentrating - 0 - 0 1 - -  Moving slowly or fidgety/restless - 0 - 0 0 - -  Suicidal thoughts - 0 - 0 0 - -  PHQ-9 Score - 5 - 3 15 - -  Difficult doing work/chores - Somewhat difficult - - - - -  Some recent data might be hidden      Review of Systems  All other systems reviewed and are negative.      Objective:   Physical Exam Vitals signs and nursing note reviewed.  Constitutional:      Appearance: Normal appearance.  Neck:     Musculoskeletal: Normal range of motion and neck supple.  Cardiovascular:     Rate and Rhythm: Normal rate and regular rhythm.     Pulses: Normal pulses.     Heart sounds: Normal heart sounds.  Pulmonary:     Effort: Pulmonary effort is normal.     Breath sounds: Normal breath sounds.  Musculoskeletal:     Comments: Normal Muscle Bulk and Muscle Testing Reveals:  Upper Extremities: Full ROM and Muscle Strength 5/5  Lumbar Paraspinal Tenderness: L-4-L-5 Bilateral Greater Trochanter Tenderness: L>R Lower Extremities: Decreased ROM and Muscle  Strength 5/5  Bilateral Lower Extremities Flexion Produces Pain into her Lower Extremities and Bilateral Hips  Arises from Table with ease Narrow Based Gait   Skin:    General: Skin is warm and dry.  Neurological:     Mental Status: She is alert and oriented to person, place, and time.  Psychiatric:        Mood and Affect: Mood normal.        Behavior: Behavior normal.           Assessment & Plan:  1.Chronic BilateralHip pain L>R---endstage OA of hips..Continue HEP as tolerated and Heat and Ice Therapy. Refilled:Hydrocodone 10/325 one q6 hours as needed for moderate pain #120, 09/08/2018. Second script e-scribed for the following month. We will continue the opioid monitoring program, this consists of regular clinic visits, examinations, urine drug screen, pill counts as well as use of [place] Controlled Substance Reporting System. 2. Lumbar Radiculitis:Continue medication regimen withPamelor. Continue Cymbalta and Continue HEP as Tolerated. Continue to Monitor. 09/08/2018- 3. Morbid obesity: Continue with healthy diet Regime and HEP. Encouraged to continue with healthy diet regime andHEP. 09/08/2018 4. Bilateral Greater Trochanteric Bursitis:.Continue with Heat and Ice Therapy. Continue to Monitor.09/08/2018 5. Left Knee Pain:No complaints today.Continue with HEP as Tolerated. Continue to Monitor.09/08/2018 6. IT Band Syndrome: Encouraged to continue with HEP and Exercises provided, she verbalizes understanding. 09/08/2018 7. Left Hand Pain: RX: X-Ray: Continue to monitor.   15  minutes of face to face patient care time was spent during this visit. All questions were encouraged and answered.  F/U in 2 months

## 2018-09-08 NOTE — Telephone Encounter (Signed)
Pt has been informed there was a referral sent to Neurology for her sleep issues. Pt stated that she probably cannot afford the copay for the Neurology appointment. What do you recommend at this time?

## 2018-09-10 ENCOUNTER — Other Ambulatory Visit: Payer: Self-pay

## 2018-09-11 ENCOUNTER — Observation Stay (HOSPITAL_COMMUNITY)
Admission: EM | Admit: 2018-09-11 | Discharge: 2018-09-13 | Disposition: A | Discharge status: Home / Self Care | Payer: BC Managed Care – PPO | Attending: General Surgery | Admitting: General Surgery

## 2018-09-11 ENCOUNTER — Other Ambulatory Visit: Payer: Self-pay

## 2018-09-11 ENCOUNTER — Observation Stay (HOSPITAL_COMMUNITY): Payer: BC Managed Care – PPO

## 2018-09-11 ENCOUNTER — Encounter (HOSPITAL_COMMUNITY): Payer: Self-pay | Admitting: Emergency Medicine

## 2018-09-11 DIAGNOSIS — E119 Type 2 diabetes mellitus without complications: Secondary | ICD-10-CM | POA: Diagnosis not present

## 2018-09-11 DIAGNOSIS — Z1159 Encounter for screening for other viral diseases: Secondary | ICD-10-CM | POA: Diagnosis not present

## 2018-09-11 DIAGNOSIS — Z79899 Other long term (current) drug therapy: Secondary | ICD-10-CM | POA: Insufficient documentation

## 2018-09-11 DIAGNOSIS — F329 Major depressive disorder, single episode, unspecified: Secondary | ICD-10-CM | POA: Insufficient documentation

## 2018-09-11 DIAGNOSIS — K8020 Calculus of gallbladder without cholecystitis without obstruction: Secondary | ICD-10-CM | POA: Diagnosis present

## 2018-09-11 DIAGNOSIS — I10 Essential (primary) hypertension: Secondary | ICD-10-CM | POA: Diagnosis not present

## 2018-09-11 DIAGNOSIS — K8000 Calculus of gallbladder with acute cholecystitis without obstruction: Principal | ICD-10-CM | POA: Insufficient documentation

## 2018-09-11 DIAGNOSIS — M160 Bilateral primary osteoarthritis of hip: Secondary | ICD-10-CM

## 2018-09-11 DIAGNOSIS — R52 Pain, unspecified: Secondary | ICD-10-CM

## 2018-09-11 DIAGNOSIS — Z794 Long term (current) use of insulin: Secondary | ICD-10-CM | POA: Insufficient documentation

## 2018-09-11 DIAGNOSIS — G894 Chronic pain syndrome: Secondary | ICD-10-CM

## 2018-09-11 HISTORY — DX: Calculus of gallbladder without cholecystitis without obstruction: K80.20

## 2018-09-11 LAB — URINALYSIS, ROUTINE W REFLEX MICROSCOPIC
Bilirubin Urine: NEGATIVE
Glucose, UA: >=500 mg/dL — AB
Hgb urine dipstick: NEGATIVE
Ketones, ur: NEGATIVE mg/dL
Leukocytes,Ua: NEGATIVE
Nitrite: NEGATIVE
Protein, ur: 30 mg/dL — AB
Specific Gravity, Urine: 1.005 (ref 1.005–1.030)
pH: 6 (ref 5.0–8.0)

## 2018-09-11 LAB — CBC
HCT: 37.1 % (ref 36.0–46.0)
HCT: 35.7 % — ABNORMAL LOW (ref 36.0–46.0)
Hemoglobin: 11.1 g/dL — ABNORMAL LOW (ref 12.0–15.0)
Hemoglobin: 11 g/dL — ABNORMAL LOW (ref 12.0–15.0)
MCH: 22.7 pg — ABNORMAL LOW (ref 26.0–34.0)
MCH: 22.8 pg — ABNORMAL LOW (ref 26.0–34.0)
MCHC: 29.9 g/dL — ABNORMAL LOW (ref 30.0–36.0)
MCHC: 30.8 g/dL (ref 30.0–36.0)
MCV: 75.9 fL — ABNORMAL LOW (ref 80.0–100.0)
MCV: 73.9 fL — ABNORMAL LOW (ref 80.0–100.0)
Platelets: 453 10*3/uL — ABNORMAL HIGH (ref 150–400)
Platelets: 484 10*3/uL — ABNORMAL HIGH (ref 150–400)
RBC: 4.89 MIL/uL (ref 3.87–5.11)
RBC: 4.83 MIL/uL (ref 3.87–5.11)
RDW: 14.6 % (ref 11.5–15.5)
RDW: 14.6 % (ref 11.5–15.5)
WBC: 13.8 10*3/uL — ABNORMAL HIGH (ref 4.0–10.5)
WBC: 13.7 10*3/uL — ABNORMAL HIGH (ref 4.0–10.5)
nRBC: 0 % (ref 0.0–0.2)
nRBC: 0 % (ref 0.0–0.2)

## 2018-09-11 LAB — SURGICAL PCR SCREEN
MRSA, PCR: NEGATIVE
Staphylococcus aureus: NEGATIVE

## 2018-09-11 LAB — COMPREHENSIVE METABOLIC PANEL
ALT: 16 U/L (ref 0–44)
AST: 15 U/L (ref 15–41)
Albumin: 3.1 g/dL — ABNORMAL LOW (ref 3.5–5.0)
Alkaline Phosphatase: 142 U/L — ABNORMAL HIGH (ref 38–126)
Anion gap: 11 (ref 5–15)
BUN: 10 mg/dL (ref 6–20)
CO2: 24 mmol/L (ref 22–32)
Calcium: 9 mg/dL (ref 8.9–10.3)
Chloride: 100 mmol/L (ref 98–111)
Creatinine, Ser: 1.07 mg/dL — ABNORMAL HIGH (ref 0.44–1.00)
GFR calc Af Amer: >60 mL/min (ref >60)
GFR calc non Af Amer: 58 mL/min — ABNORMAL LOW (ref >60)
Glucose, Bld: 249 mg/dL — ABNORMAL HIGH (ref 70–99)
Potassium: 3.5 mmol/L (ref 3.5–5.1)
Sodium: 135 mmol/L (ref 135–145)
Total Bilirubin: 0.6 mg/dL (ref 0.3–1.2)
Total Protein: 6.6 g/dL (ref 6.5–8.1)

## 2018-09-11 LAB — GLUCOSE, CAPILLARY
Glucose-Capillary: 165 mg/dL — ABNORMAL HIGH (ref 70–99)
Glucose-Capillary: 268 mg/dL — ABNORMAL HIGH (ref 70–99)

## 2018-09-11 LAB — I-STAT BETA HCG BLOOD, ED (MC, WL, AP ONLY): I-stat hCG, quantitative: 7.9 m[IU]/mL — ABNORMAL HIGH (ref <5)

## 2018-09-11 LAB — CREATININE, SERUM
Creatinine, Ser: 1.04 mg/dL — ABNORMAL HIGH (ref 0.44–1.00)
GFR calc Af Amer: >60 mL/min (ref >60)
GFR calc non Af Amer: 60 mL/min — ABNORMAL LOW (ref >60)

## 2018-09-11 LAB — LIPASE, BLOOD: Lipase: 41 U/L (ref 11–51)

## 2018-09-11 LAB — SARS CORONAVIRUS 2 BY RT PCR (HOSPITAL ORDER, PERFORMED IN ~~LOC~~ HOSPITAL LAB): SARS Coronavirus 2: NEGATIVE

## 2018-09-11 MED ORDER — PANTOPRAZOLE SODIUM 40 MG PO TBEC
40.0000 mg | DELAYED_RELEASE_TABLET | Freq: Every day | ORAL | Status: DC
  Administered 2018-09-11 – 2018-09-13 (×3): 40 mg via ORAL
  Filled 2018-09-11 (×3): qty 1

## 2018-09-11 MED ORDER — GABAPENTIN 300 MG PO CAPS
300.0000 mg | ORAL_CAPSULE | Freq: Once | ORAL | Status: AC
  Administered 2018-09-12: 300 mg via ORAL
  Filled 2018-09-11: qty 1

## 2018-09-11 MED ORDER — DULOXETINE HCL 60 MG PO CPEP
60.0000 mg | ORAL_CAPSULE | Freq: Two times a day (BID) | ORAL | Status: DC
  Administered 2018-09-11 – 2018-09-13 (×3): 60 mg via ORAL
  Filled 2018-09-11 (×3): qty 1

## 2018-09-11 MED ORDER — SODIUM CHLORIDE 0.9% FLUSH
3.0000 mL | Freq: Once | INTRAVENOUS | Status: AC
  Administered 2018-09-11: 3 mL via INTRAVENOUS

## 2018-09-11 MED ORDER — POTASSIUM CHLORIDE IN NACL 20-0.45 MEQ/L-% IV SOLN
INTRAVENOUS | Status: DC
  Administered 2018-09-11 – 2018-09-13 (×3): via INTRAVENOUS
  Filled 2018-09-11 (×4): qty 1000

## 2018-09-11 MED ORDER — FUROSEMIDE 80 MG PO TABS
80.0000 mg | ORAL_TABLET | Freq: Every day | ORAL | Status: DC
  Administered 2018-09-11 – 2018-09-13 (×3): 80 mg via ORAL
  Filled 2018-09-11 (×3): qty 1

## 2018-09-11 MED ORDER — HYDROMORPHONE HCL 1 MG/ML IJ SOLN: 0.5000 mg | INTRAMUSCULAR | Status: DC | PRN

## 2018-09-11 MED ORDER — FENTANYL CITRATE (PF) 100 MCG/2ML IJ SOLN
50.0000 ug | Freq: Once | INTRAMUSCULAR | Status: AC
  Administered 2018-09-11: 50 ug via INTRAVENOUS
  Filled 2018-09-11: qty 2

## 2018-09-11 MED ORDER — ENOXAPARIN SODIUM 40 MG/0.4ML ~~LOC~~ SOLN
40.0000 mg | SUBCUTANEOUS | Status: DC
  Administered 2018-09-12: 16:00:00 40 mg via SUBCUTANEOUS
  Filled 2018-09-11 (×2): qty 0.4

## 2018-09-11 MED ORDER — ACETAMINOPHEN 500 MG PO TABS
1000.0000 mg | ORAL_TABLET | Freq: Four times a day (QID) | ORAL | Status: DC
  Administered 2018-09-11 – 2018-09-13 (×6): 1000 mg via ORAL
  Filled 2018-09-11 (×7): qty 2

## 2018-09-11 MED ORDER — KETOROLAC TROMETHAMINE 30 MG/ML IJ SOLN
30.0000 mg | Freq: Four times a day (QID) | INTRAMUSCULAR | Status: DC | PRN
  Administered 2018-09-11 – 2018-09-13 (×3): 30 mg via INTRAVENOUS
  Filled 2018-09-11 (×3): qty 1

## 2018-09-11 MED ORDER — POLYETHYLENE GLYCOL 3350 17 G PO PACK: 17.0000 g | PACK | Freq: Every day | ORAL | Status: DC | PRN

## 2018-09-11 MED ORDER — METOPROLOL TARTRATE 5 MG/5ML IV SOLN: 5.0000 mg | Freq: Four times a day (QID) | INTRAVENOUS | Status: DC | PRN

## 2018-09-11 MED ORDER — SCOPOLAMINE 1 MG/3DAYS TD PT72
1.0000 | MEDICATED_PATCH | Freq: Once | TRANSDERMAL | Status: DC
  Administered 2018-09-12: 06:00:00 1.5 mg via TRANSDERMAL
  Filled 2018-09-11 (×2): qty 1

## 2018-09-11 MED ORDER — LORAZEPAM 0.5 MG PO TABS
0.5000 mg | ORAL_TABLET | Freq: Two times a day (BID) | ORAL | Status: DC | PRN
  Administered 2018-09-12: 0.5 mg via ORAL
  Filled 2018-09-11: qty 1

## 2018-09-11 MED ORDER — ONDANSETRON 4 MG PO TBDP: 4.0000 mg | ORAL_TABLET | Freq: Four times a day (QID) | ORAL | Status: DC | PRN

## 2018-09-11 MED ORDER — INSULIN ASPART 100 UNIT/ML ~~LOC~~ SOLN
0.0000 [IU] | Freq: Every day | SUBCUTANEOUS | Status: DC
  Administered 2018-09-11: 22:00:00 3 [IU] via SUBCUTANEOUS
  Administered 2018-09-12: 5 [IU] via SUBCUTANEOUS

## 2018-09-11 MED ORDER — SODIUM CHLORIDE 0.9 % IV SOLN
2.0000 g | INTRAVENOUS | Status: DC
  Administered 2018-09-11 – 2018-09-12 (×2): 2 g via INTRAVENOUS
  Filled 2018-09-11 (×2): qty 2
  Filled 2018-09-11: qty 20

## 2018-09-11 MED ORDER — NORTRIPTYLINE HCL 25 MG PO CAPS
25.0000 mg | ORAL_CAPSULE | Freq: Every day | ORAL | Status: DC
  Administered 2018-09-11 – 2018-09-12 (×2): 25 mg via ORAL
  Filled 2018-09-11 (×3): qty 1

## 2018-09-11 MED ORDER — INSULIN ASPART 100 UNIT/ML ~~LOC~~ SOLN
0.0000 [IU] | Freq: Three times a day (TID) | SUBCUTANEOUS | Status: DC
  Administered 2018-09-11: 4 [IU] via SUBCUTANEOUS
  Administered 2018-09-12: 11 [IU] via SUBCUTANEOUS
  Administered 2018-09-12: 20 [IU] via SUBCUTANEOUS
  Administered 2018-09-13: 7 [IU] via SUBCUTANEOUS
  Administered 2018-09-13: 4 [IU] via SUBCUTANEOUS

## 2018-09-11 MED ORDER — CARVEDILOL 3.125 MG PO TABS
3.1250 mg | ORAL_TABLET | Freq: Every day | ORAL | Status: DC
  Administered 2018-09-11 – 2018-09-13 (×3): 3.125 mg via ORAL
  Filled 2018-09-11 (×3): qty 1

## 2018-09-11 MED ORDER — OXYCODONE HCL 5 MG PO TABS
5.0000 mg | ORAL_TABLET | ORAL | Status: DC | PRN
  Administered 2018-09-11 – 2018-09-13 (×5): 10 mg via ORAL
  Filled 2018-09-11 (×6): qty 2

## 2018-09-11 MED ORDER — INSULIN GLARGINE 100 UNIT/ML ~~LOC~~ SOLN
140.0000 [IU] | Freq: Every day | SUBCUTANEOUS | Status: DC
  Administered 2018-09-11: 140 [IU] via SUBCUTANEOUS
  Filled 2018-09-11 (×3): qty 1.4

## 2018-09-11 MED ORDER — ONDANSETRON HCL 4 MG/2ML IJ SOLN: 4.0000 mg | Freq: Four times a day (QID) | INTRAMUSCULAR | Status: DC | PRN

## 2018-09-11 NOTE — ED Notes (Signed)
ED TO INPATIENT HANDOFF REPORT  ED Nurse Name and Phone #: William Hamburger RN [phone]  S Name/Age/Gender [name] 58 y.o. female Room/Bed: 037C/037C  Code Status   Code Status: Full Code  Home/SNF/Other Home Patient oriented to: situation Is this baseline? Yes   Triage Complete: Triage complete  Chief Complaint abd pain  Triage Note Pt states she has been having severe abd pain for a year. Pt was scheduled for surgery to have her gall bladder removed in March, but the procedure was canceled due to Covid. Pt reports worsening abd pain and her MD advised her to come to the ED. Pt denies vomiting but has felt very nauseated.   Allergies Allergies  Allergen Reactions  . Tramadol Nausea Only    Level of Care/Admitting Diagnosis ED Disposition    ED Disposition Condition [facility] Area: [place] [[id number]]  Level of Care: Med-Surg [16]  Covid Evaluation: Asymptomatic Screening Protocol (No Symptoms)  Diagnosis: Symptomatic cholelithiasis [962229]  Admitting Physician: Rolm Bookbinder [[id number]]  Attending Physician: CCS, MD [[id number]]  Bed request comments: 6N  PT Class (Do Not Modify): Observation [104]  PT Acc Code (Do Not Modify): Observation [10022]       B Medical/Surgery History Past Medical History:  Diagnosis Date  . Abscess    increased drainage from abscess on buttock  . Anal fistula   . Anxiety   . Bilateral hip pain 05/27/2015  . Chronic pain syndrome 05/27/2015  . Depression    sees [name]  . Diabetes mellitus without complication ([place])   . Hypertension   . Sleep apnea    2008- sleep study, neg. for sleep apnea   . SVT (supraventricular tachycardia) (HCC)    Past Surgical History:  Procedure Laterality Date  . ANAL EXAMINATION UNDER ANESTHESIA  02/21/11   anal fistula  . BREAST SURGERY  patient does not remember date of procedure   pull fluid off lft br  . ELECTROPHYSIOLOGIC STUDY N/A 05/05/2015   Procedure: SVT Ablation;  Surgeon: Will Meredith Leeds, MD;  Location: [facility];  Service: Cardiovascular;  Laterality: N/A;  . EP IMPLANTABLE DEVICE N/A 01/30/2016   Procedure: Loop Recorder Insertion;  Surgeon: Sanda Klein, MD;  Location: [facility];  Service: Cardiovascular;  Laterality: N/A;  . INCISE AND DRAIN ABCESS     abscess on right thigh and buttock  . KNEE ARTHROSCOPY     left  . SHOULDER SURGERY  04/14/09   right     A IV Location/Drains/Wounds Patient Lines/Drains/Airways Status   Active Line/Drains/Airways    None          Intake/Output Last 24 hours No intake or output data in the 24 hours ending 09/11/18 1556  Labs/Imaging Results for orders placed or performed during the hospital encounter of 09/11/18 (from the past 48 hour(s))  Urinalysis, Routine w reflex microscopic     Status: Abnormal   Collection Time: 09/11/18 11:55 AM  Result Value Ref Range   Color, Urine STRAW (A) YELLOW   APPearance CLEAR CLEAR   Specific Gravity, Urine 1.005 1.005 - 1.030   pH 6.0 5.0 - 8.0   Glucose, UA >=500 (A) NEGATIVE mg/dL   Hgb urine dipstick NEGATIVE NEGATIVE   Bilirubin Urine NEGATIVE NEGATIVE   Ketones, ur NEGATIVE NEGATIVE mg/dL   Protein, ur 30 (A) NEGATIVE mg/dL   Nitrite NEGATIVE NEGATIVE   Leukocytes,Ua NEGATIVE NEGATIVE   RBC / HPF 0-5 0 -  5 RBC/hpf   WBC, UA 0-5 0 - 5 WBC/hpf   Bacteria, UA FEW (A) NONE SEEN   Squamous Epithelial / LPF 0-5 0 - 5    Comment: Performed at [facility], [place] [address], [place], [place] [address]  Lipase, blood     Status: None   Collection Time: 09/11/18  1:45 PM  Result Value Ref Range   Lipase 41 11 - 51 U/L    Comment: Performed at [facility] [address], [place], [place] [address]  Comprehensive metabolic panel     Status: Abnormal   Collection Time: 09/11/18  1:45 PM  Result Value Ref Range   Sodium 135 135 - 145 mmol/L   Potassium 3.5 3.5 - 5.1 mmol/L   Chloride 100 98 - 111  mmol/L   CO2 24 22 - 32 mmol/L   Glucose, Bld 249 (H) 70 - 99 mg/dL   BUN 10 6 - 20 mg/dL   Creatinine, Ser 1.07 (H) 0.44 - 1.00 mg/dL   Calcium 9.0 8.9 - 10.3 mg/dL   Total Protein 6.6 6.5 - 8.1 g/dL   Albumin 3.1 (L) 3.5 - 5.0 g/dL   AST 15 15 - 41 U/L   ALT 16 0 - 44 U/L   Alkaline Phosphatase 142 (H) 38 - 126 U/L   Total Bilirubin 0.6 0.3 - 1.2 mg/dL   GFR calc non Af Amer 58 (L) >60 mL/min   GFR calc Af Amer >60 >60 mL/min   Anion gap 11 5 - 15    Comment: Performed at [facility] [address], [place], [place] [address]  CBC     Status: Abnormal   Collection Time: 09/11/18  1:45 PM  Result Value Ref Range   WBC 13.8 (H) 4.0 - 10.5 K/uL   RBC 4.89 3.87 - 5.11 MIL/uL   Hemoglobin 11.1 (L) 12.0 - 15.0 g/dL   HCT 37.1 36.0 - 46.0 %   MCV 75.9 (L) 80.0 - 100.0 fL   MCH 22.7 (L) 26.0 - 34.0 pg   MCHC 29.9 (L) 30.0 - 36.0 g/dL   RDW 14.6 11.5 - 15.5 %   Platelets 453 (H) 150 - 400 K/uL   nRBC 0.0 0.0 - 0.2 %    Comment: Performed at [facility] [address], [place],  [address]  I-Stat beta hCG blood, ED     Status: Abnormal   Collection Time: 09/11/18  1:55 PM  Result Value Ref Range   I-stat hCG, quantitative 7.9 (H) <5 mIU/mL   Comment 3            Comment:   GEST. AGE      CONC.  (mIU/mL)   <=1 WEEK        5 - 50     2 WEEKS       50 - 500     3 WEEKS       100 - 10,000     4 WEEKS     1,000 - 30,000        FEMALE AND NON-PREGNANT FEMALE:     LESS THAN 5 mIU/mL    No results found.  Pending Labs Unresulted Labs (From admission, onward)    Start     Ordered   09/18/18 0500  Creatinine, serum  (enoxaparin (LOVENOX)    CrCl >/= 30 ml/min)  Weekly,   R    Comments: while on enoxaparin therapy  09/11/18 1549   09/12/18 0500  Comprehensive metabolic panel  Tomorrow morning,   R     09/11/18 1549   09/12/18 0500  CBC  Tomorrow morning,   R     09/11/18 1549   09/11/18 1547  SARS Coronavirus 2 (CEPHEID - Performed in [facility] hospital  lab), [facility] Order  Once,   STAT    Question:  Rule Out  Answer:  Yes   09/11/18 1549   09/11/18 1541  CBC  (enoxaparin (LOVENOX)    CrCl >/= 30 ml/min)  Once,   STAT    Comments: Baseline for enoxaparin therapy IF NOT ALREADY DRAWN.  Notify MD if PLT < 100 K.    09/11/18 1549   09/11/18 1541  Creatinine, serum  (enoxaparin (LOVENOX)    CrCl >/= 30 ml/min)  Once,   STAT    Comments: Baseline for enoxaparin therapy IF NOT ALREADY DRAWN.    09/11/18 1549   09/11/18 1540  HIV antibody (Routine Testing)  Once,   STAT     09/11/18 1549          Vitals/Pain Today's Vitals   09/11/18 1430 09/11/18 1445 09/11/18 1500 09/11/18 1515  BP: (!) 158/114 139/82 108/84 (!) 143/69  Pulse: 93 93 92 93  Resp: 13 18 18 13   Temp:      TempSrc:      SpO2: 97% 97% 98% 97%  Weight:      Height:      PainSc:        Isolation Precautions No active isolations  Medications Medications  enoxaparin (LOVENOX) injection 40 mg (has no administration in time range)  0.45 % NaCl with KCl 20 mEq / L infusion (has no administration in time range)  cefTRIAXone (ROCEPHIN) 2 g in sodium chloride 0.9 % 100 mL IVPB (has no administration in time range)  acetaminophen (TYLENOL) tablet 1,000 mg (has no administration in time range)  ketorolac (TORADOL) 30 MG/ML injection 30 mg (has no administration in time range)  oxyCODONE (Oxy IR/ROXICODONE) immediate release tablet 5-10 mg (has no administration in time range)  HYDROmorphone (DILAUDID) injection 0.5-1 mg (has no administration in time range)  ondansetron (ZOFRAN-ODT) disintegrating tablet 4 mg (has no administration in time range)    Or  ondansetron (ZOFRAN) injection 4 mg (has no administration in time range)  polyethylene glycol (MIRALAX / GLYCOLAX) packet 17 g (has no administration in time range)  metoprolol tartrate (LOPRESSOR) injection 5 mg (has no administration in time range)  insulin glargine (LANTUS) injection 140 Units (has no administration in  time range)  insulin aspart (novoLOG) injection 0-20 Units (has no administration in time range)  insulin aspart (novoLOG) injection 0-5 Units (has no administration in time range)  carvedilol (COREG) tablet 3.125 mg (has no administration in time range)  pantoprazole (PROTONIX) EC tablet 40 mg (has no administration in time range)  DULoxetine (CYMBALTA) DR capsule 60 mg (has no administration in time range)  furosemide (LASIX) tablet 80 mg (has no administration in time range)  LORazepam (ATIVAN) tablet 0.5 mg (has no administration in time range)  nortriptyline (PAMELOR) capsule 25 mg (has no administration in time range)  sodium chloride flush (NS) 0.9 % injection 3 mL (3 mLs Intravenous Given 09/11/18 1345)  fentaNYL (SUBLIMAZE) injection 50 mcg (50 mcg Intravenous Given 09/11/18 1411)    Mobility walks Low fall risk   Focused Assessments Neuro Assessment Handoff:  Swallow screen pass? No  Neuro Assessment:   Neuro Checks:      Last Documented NIHSS Modified Score:   Has TPA been given? No If patient is a Neuro Trauma and patient is going to OR before floor call report to [place] nurse: [phone] or [phone]     R Recommendations: See Admitting Provider Note  Report given to:   Additional Notes: Pt here for abdominal pain; reported had a cancelled chole last March d/t Covid. Taken to [place] @ 1557.

## 2018-09-11 NOTE — ED Notes (Signed)
6N unable to take report at this time 

## 2018-09-11 NOTE — ED Notes (Signed)
Patient transported to Ultrasound 

## 2018-09-11 NOTE — Plan of Care (Signed)

## 2018-09-11 NOTE — ED Notes (Signed)
Per [place]; pt still awaiting to get back to unit.

## 2018-09-11 NOTE — Progress Notes (Signed)
[facility] Summary Report / Loop Recorder 

## 2018-09-11 NOTE — ED Triage Notes (Signed)
Pt states she has been having severe abd pain for a year. Pt was scheduled for surgery to have her gall bladder removed in March, but the procedure was canceled due to Covid. Pt reports worsening abd pain and her MD advised her to come to the ED. Pt denies vomiting but has felt very nauseated.

## 2018-09-11 NOTE — Anesthesia Preprocedure Evaluation (Addendum)
Anesthesia Evaluation  Patient identified by MRN, date of birth, ID band Patient awake    Reviewed: Allergy & Precautions, NPO status , Patient's Chart, lab work & pertinent test results, reviewed documented beta blocker date and time   Airway Mallampati: II  TM Distance: >3 FB Neck ROM: Full    Dental  (+) Teeth Intact, Dental Advisory Given   Pulmonary sleep apnea ,    Pulmonary exam normal breath sounds clear to auscultation       Cardiovascular hypertension, Pt. on home beta blockers and Pt. on medications Normal cardiovascular exam+ dysrhythmias Supra Ventricular Tachycardia  Rhythm:Regular Rate:Normal     Neuro/Psych PSYCHIATRIC DISORDERS Anxiety Depression negative neurological ROS     GI/Hepatic GERD  Medicated, SYMPTOMATIC CHOLELITHIASIS   Endo/Other  diabetes, Type 2, Insulin DependentMorbid obesity  Renal/GU negative Renal ROS     Musculoskeletal  (+) Arthritis ,   Abdominal   Peds  Hematology negative hematology ROS (+)   Anesthesia Other Findings Day of surgery medications reviewed with the patient.  Reproductive/Obstetrics                            Anesthesia Physical Anesthesia Plan  ASA: III  Anesthesia Plan: General   Post-op Pain Management:    Induction: Intravenous  PONV Risk Score and Plan: 4 or greater and Diphenhydramine, Scopolamine patch - Pre-op, Midazolam, Dexamethasone and Ondansetron  Airway Management Planned: Oral ETT  Additional Equipment:   Intra-op Plan:   Post-operative Plan: Extubation in OR  Informed Consent: I have reviewed the patients History and Physical, chart, labs and discussed the procedure including the risks, benefits and alternatives for the proposed anesthesia with the patient or authorized representative who has indicated his/her understanding and acceptance.     Dental advisory given  Plan Discussed with:  CRNA  Anesthesia Plan Comments:        Anesthesia Quick Evaluation

## 2018-09-11 NOTE — H&P (Signed)
[facility] Admission Note  [name] 06/01/1960  [id number].    Requesting MD: Delane Ginger Chief Complaint/Reason for Consult: symptomatic cholelithiasis HPI:  Patient is a 58 year old female who has been followed in [place] office for cholelithiasis. Cholecystectomy has been delayed due to [place]. She presented to [facility] today with several months of intermittent epigastric pain that is brought on by eating. Pain radiates to RUQ and is associated with nausea but no vomiting. Patient denies fever, chills, chest pain, SOB, diarrhea, constipation, urinary symptoms. PMH otherwise significant for HTN and insulin dependent diabetes. Patient reports both BP and blood sugars are fairly well controlled. Hx of loop recorder for SVT 3 years ago, but has not had any episodes since. No past abdominal surgeries. No blood thinning medications. Patient reports tramadol makes her nauseated. Denies alcohol, tobacco, or illicit drug use. Works as a Pharmacist, hospital.   ROS: Review of Systems  Constitutional: Negative for chills and fever.  Respiratory: Negative for shortness of breath and wheezing.   Cardiovascular: Negative for chest pain and palpitations.  Gastrointestinal: Positive for abdominal pain and nausea. Negative for blood in stool, constipation, diarrhea, melena and vomiting.  Genitourinary: Negative for dysuria, frequency and urgency.  All other systems reviewed and are negative.   Family History  Problem Relation Age of Onset  . Hypertension Mother   . Alzheimer's disease Mother   . Diabetes Father   . Breast cancer Sister 25  . Anesthesia problems Neg Hx   . Hypotension Neg Hx   . Malignant hyperthermia Neg Hx   . Pseudochol deficiency Neg Hx   . Colon cancer Neg Hx   . Esophageal cancer Neg Hx   . Stomach cancer Neg Hx   . Rectal cancer Neg Hx   . Thyroid disease Neg Hx     Past Medical History:  Diagnosis Date  . Abscess    increased drainage from abscess on buttock  .  Anal fistula   . Anxiety   . Bilateral hip pain 05/27/2015  . Chronic pain syndrome 05/27/2015  . Depression    sees [name]  . Diabetes mellitus without complication ([place])   . Hypertension   . Sleep apnea    2008- sleep study, neg. for sleep apnea   . SVT (supraventricular tachycardia) (HCC)     Past Surgical History:  Procedure Laterality Date  . ANAL EXAMINATION UNDER ANESTHESIA  02/21/11   anal fistula  . BREAST SURGERY  patient does not remember date of procedure   pull fluid off lft br  . ELECTROPHYSIOLOGIC STUDY N/A 05/05/2015   Procedure: SVT Ablation;  Surgeon: Will Meredith Leeds, MD;  Location: [facility];  Service: Cardiovascular;  Laterality: N/A;  . EP IMPLANTABLE DEVICE N/A 01/30/2016   Procedure: Loop Recorder Insertion;  Surgeon: Sanda Klein, MD;  Location: [facility];  Service: Cardiovascular;  Laterality: N/A;  . INCISE AND DRAIN ABCESS     abscess on right thigh and buttock  . KNEE ARTHROSCOPY     left  . SHOULDER SURGERY  04/14/09   right    Social History:  reports that she has never smoked. She has never used smokeless tobacco. She reports that she does not drink alcohol or use drugs.  Allergies:  Allergies  Allergen Reactions  . Tramadol Nausea Only    (Not in a hospital admission)   Blood pressure (!) 143/69, pulse 93, temperature 98.6 F (37 C), temperature source Oral, resp. rate 13, height 5'  3" (1.6 m), weight 113.9 kg, last menstrual period 02/09/2017, SpO2 97 %. Physical Exam: Physical Exam Constitutional:      General: She is not in acute distress.    Appearance: She is well-developed. She is morbidly obese. She is not toxic-appearing.  HENT:     Head: Normocephalic and atraumatic.     Right Ear: External ear normal.     Left Ear: External ear normal.     Nose: Nose normal.     Mouth/Throat:     Lips: Pink.     Mouth: Mucous membranes are moist.  Eyes:     General: Lids are normal. No scleral icterus.     Extraocular Movements: Extraocular movements intact.     Conjunctiva/sclera: Conjunctivae normal.  Neck:     Musculoskeletal: Normal range of motion and neck supple.  Cardiovascular:     Rate and Rhythm: Normal rate and regular rhythm.     Pulses:          Radial pulses are 2+ on the right side and 2+ on the left side.       Dorsalis pedis pulses are 2+ on the right side and 2+ on the left side.  Pulmonary:     Effort: Pulmonary effort is normal.     Breath sounds: Normal breath sounds.  Abdominal:     General: There is no distension.     Palpations: Abdomen is soft.     Tenderness: There is abdominal tenderness (very mild) in the right upper quadrant and epigastric area. There is no guarding or rebound. Negative signs include Murphy's sign.  Musculoskeletal:     Comments: ROM grossly intact in bilateral upper and lower extremities  Skin:    General: Skin is warm and dry.  Neurological:     Mental Status: She is alert and oriented to person, place, and time.  Psychiatric:        Attention and Perception: Attention and perception normal.        Mood and Affect: Mood and affect normal.        Speech: Speech normal.        Behavior: Behavior is cooperative.     Results for orders placed or performed during the hospital encounter of 09/11/18 (from the past 48 hour(s))  Urinalysis, Routine w reflex microscopic     Status: Abnormal   Collection Time: 09/11/18 11:55 AM  Result Value Ref Range   Color, Urine STRAW (A) YELLOW   APPearance CLEAR CLEAR   Specific Gravity, Urine 1.005 1.005 - 1.030   pH 6.0 5.0 - 8.0   Glucose, UA >=500 (A) NEGATIVE mg/dL   Hgb urine dipstick NEGATIVE NEGATIVE   Bilirubin Urine NEGATIVE NEGATIVE   Ketones, ur NEGATIVE NEGATIVE mg/dL   Protein, ur 30 (A) NEGATIVE mg/dL   Nitrite NEGATIVE NEGATIVE   Leukocytes,Ua NEGATIVE NEGATIVE   RBC / HPF 0-5 0 - 5 RBC/hpf   WBC, UA 0-5 0 - 5 WBC/hpf   Bacteria, UA FEW (A) NONE SEEN   Squamous Epithelial / LPF  0-5 0 - 5    Comment: Performed at [facility], [address], [place], [place] [address]  Lipase, blood     Status: None   Collection Time: 09/11/18  1:45 PM  Result Value Ref Range   Lipase 41 11 - 51 U/L    Comment: Performed at [facility] [address] [place], [place] [address]  Comprehensive metabolic panel  Status: Abnormal   Collection Time: 09/11/18  1:45 PM  Result Value Ref Range   Sodium 135 135 - 145 mmol/L   Potassium 3.5 3.5 - 5.1 mmol/L   Chloride 100 98 - 111 mmol/L   CO2 24 22 - 32 mmol/L   Glucose, Bld 249 (H) 70 - 99 mg/dL   BUN 10 6 - 20 mg/dL   Creatinine, Ser 1.07 (H) 0.44 - 1.00 mg/dL   Calcium 9.0 8.9 - 10.3 mg/dL   Total Protein 6.6 6.5 - 8.1 g/dL   Albumin 3.1 (L) 3.5 - 5.0 g/dL   AST 15 15 - 41 U/L   ALT 16 0 - 44 U/L   Alkaline Phosphatase 142 (H) 38 - 126 U/L   Total Bilirubin 0.6 0.3 - 1.2 mg/dL   GFR calc non Af Amer 58 (L) >60 mL/min   GFR calc Af Amer >60 >60 mL/min   Anion gap 11 5 - 15    Comment: Performed at [facility] [address] [place], [place] [address]  CBC     Status: Abnormal   Collection Time: 09/11/18  1:45 PM  Result Value Ref Range   WBC 13.8 (H) 4.0 - 10.5 K/uL   RBC 4.89 3.87 - 5.11 MIL/uL   Hemoglobin 11.1 (L) 12.0 - 15.0 g/dL   HCT 37.1 36.0 - 46.0 %   MCV 75.9 (L) 80.0 - 100.0 fL   MCH 22.7 (L) 26.0 - 34.0 pg   MCHC 29.9 (L) 30.0 - 36.0 g/dL   RDW 14.6 11.5 - 15.5 %   Platelets 453 (H) 150 - 400 K/uL   nRBC 0.0 0.0 - 0.2 %    Comment: Performed at [facility] [address] [place], [place] [address]  I-Stat beta hCG blood, ED     Status: Abnormal   Collection Time: 09/11/18  1:55 PM  Result Value Ref Range   I-stat hCG, quantitative 7.9 (H) <5 mIU/mL   Comment 3            Comment:   GEST. AGE      CONC.  (mIU/mL)   <=1 WEEK        5 - 50     2 WEEKS       50 - 500     3 WEEKS       100 - 10,000     4 WEEKS     1,000 - 30,000        FEMALE AND NON-PREGNANT FEMALE:      LESS THAN 5 mIU/mL    No results found.    Assessment/Plan HTN T2DM - insulin dependent, home insulin and sliding scale  Symptomatic cholelithiasis Probable chronic cholecystitis - RUQ [place] pending - mild leukocytosis of 13, afeb - ALP 142, LFTs and Tbili otherwise ok, lipase 41 - negative Murphy, exam benign - admit to observation and plan for laparoscopic cholecystectomy tomorrow given severity and duration of symptoms   Brigid Re, [facility] 09/11/2018, 3:52 PM Pager: [place]: [phone]
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
## 2018-09-12 ENCOUNTER — Encounter (HOSPITAL_COMMUNITY): Payer: Self-pay | Admitting: *Deleted

## 2018-09-12 ENCOUNTER — Observation Stay (HOSPITAL_COMMUNITY): Payer: BC Managed Care – PPO | Admitting: Anesthesiology

## 2018-09-12 ENCOUNTER — Encounter (HOSPITAL_COMMUNITY)
Admission: EM | Disposition: A | Discharge status: Home / Self Care | Payer: Self-pay | Source: Home / Self Care | Attending: Emergency Medicine

## 2018-09-12 HISTORY — PX: CHOLECYSTECTOMY: SHX55

## 2018-09-12 LAB — DRUG TOX MONITOR 1 W/CONF, ORAL FLD
Alprazolam: NEGATIVE ng/mL (ref <0.50)
Amphetamines: NEGATIVE ng/mL (ref <10)
Barbiturates: NEGATIVE ng/mL (ref <10)
Benzodiazepines: NEGATIVE ng/mL (ref <0.50)
Buprenorphine: NEGATIVE ng/mL (ref <0.10)
Chlordiazepoxide: NEGATIVE ng/mL (ref <0.50)
Clonazepam: NEGATIVE ng/mL (ref <0.50)
Cocaine: NEGATIVE ng/mL (ref <5.0)
Codeine: NEGATIVE ng/mL (ref <2.5)
Diazepam: NEGATIVE ng/mL (ref <0.50)
Dihydrocodeine: 22.2 ng/mL — ABNORMAL HIGH (ref <2.5)
Fentanyl: NEGATIVE ng/mL (ref <0.10)
Flunitrazepam: NEGATIVE ng/mL (ref <0.50)
Flurazepam: NEGATIVE ng/mL (ref <0.50)
Heroin Metabolite: NEGATIVE ng/mL (ref <1.0)
Hydrocodone: 128.8 ng/mL — ABNORMAL HIGH (ref <2.5)
Hydromorphone: NEGATIVE ng/mL (ref <2.5)
Lorazepam: NEGATIVE ng/mL (ref <0.50)
MARIJUANA: NEGATIVE ng/mL (ref <2.5)
MDMA: NEGATIVE ng/mL (ref <10)
Meprobamate: NEGATIVE ng/mL (ref <2.5)
Methadone: NEGATIVE ng/mL (ref <5.0)
Midazolam: NEGATIVE ng/mL (ref <0.50)
Morphine: NEGATIVE ng/mL (ref <2.5)
Nicotine Metabolite: NEGATIVE ng/mL (ref <5.0)
Nordiazepam: NEGATIVE ng/mL (ref <0.50)
Norhydrocodone: 5.8 ng/mL — ABNORMAL HIGH (ref <2.5)
Noroxycodone: NEGATIVE ng/mL (ref <2.5)
Opiates: POSITIVE ng/mL — AB (ref <2.5)
Oxazepam: NEGATIVE ng/mL (ref <0.50)
Oxycodone: NEGATIVE ng/mL (ref <2.5)
Oxymorphone: NEGATIVE ng/mL (ref <2.5)
Phencyclidine: NEGATIVE ng/mL (ref <10)
Tapentadol: NEGATIVE ng/mL (ref <5.0)
Temazepam: NEGATIVE ng/mL (ref <0.50)
Tramadol: NEGATIVE ng/mL (ref <5.0)
Triazolam: NEGATIVE ng/mL (ref <0.50)
Zolpidem: NEGATIVE ng/mL (ref <5.0)

## 2018-09-12 LAB — COMPREHENSIVE METABOLIC PANEL
ALT: 13 U/L (ref 0–44)
AST: 11 U/L — ABNORMAL LOW (ref 15–41)
Albumin: 2.7 g/dL — ABNORMAL LOW (ref 3.5–5.0)
Alkaline Phosphatase: 118 U/L (ref 38–126)
Anion gap: 10 (ref 5–15)
BUN: 13 mg/dL (ref 6–20)
CO2: 26 mmol/L (ref 22–32)
Calcium: 8.8 mg/dL — ABNORMAL LOW (ref 8.9–10.3)
Chloride: 100 mmol/L (ref 98–111)
Creatinine, Ser: 1.35 mg/dL — ABNORMAL HIGH (ref 0.44–1.00)
GFR calc Af Amer: 50 mL/min — ABNORMAL LOW (ref >60)
GFR calc non Af Amer: 43 mL/min — ABNORMAL LOW (ref >60)
Glucose, Bld: 202 mg/dL — ABNORMAL HIGH (ref 70–99)
Potassium: 3.3 mmol/L — ABNORMAL LOW (ref 3.5–5.1)
Sodium: 136 mmol/L (ref 135–145)
Total Bilirubin: 0.4 mg/dL (ref 0.3–1.2)
Total Protein: 5.8 g/dL — ABNORMAL LOW (ref 6.5–8.1)

## 2018-09-12 LAB — CBC
HCT: 32.6 % — ABNORMAL LOW (ref 36.0–46.0)
Hemoglobin: 9.9 g/dL — ABNORMAL LOW (ref 12.0–15.0)
MCH: 22.9 pg — ABNORMAL LOW (ref 26.0–34.0)
MCHC: 30.4 g/dL (ref 30.0–36.0)
MCV: 75.5 fL — ABNORMAL LOW (ref 80.0–100.0)
Platelets: 454 10*3/uL — ABNORMAL HIGH (ref 150–400)
RBC: 4.32 MIL/uL (ref 3.87–5.11)
RDW: 14.7 % (ref 11.5–15.5)
WBC: 11.6 10*3/uL — ABNORMAL HIGH (ref 4.0–10.5)
nRBC: 0 % (ref 0.0–0.2)

## 2018-09-12 LAB — GLUCOSE, CAPILLARY
Glucose-Capillary: 202 mg/dL — ABNORMAL HIGH (ref 70–99)
Glucose-Capillary: 292 mg/dL — ABNORMAL HIGH (ref 70–99)
Glucose-Capillary: 362 mg/dL — ABNORMAL HIGH (ref 70–99)
Glucose-Capillary: 416 mg/dL — ABNORMAL HIGH (ref 70–99)

## 2018-09-12 LAB — GLUCOSE, RANDOM: Glucose, Bld: 399 mg/dL — ABNORMAL HIGH (ref 70–99)

## 2018-09-12 LAB — DRUG TOX ALC METAB W/CON, ORAL FLD: Alcohol Metabolite: NEGATIVE ng/mL (ref <25)

## 2018-09-12 SURGERY — LAPAROSCOPIC CHOLECYSTECTOMY WITH INTRAOPERATIVE CHOLANGIOGRAM
Anesthesia: General | Site: Abdomen

## 2018-09-12 MED ORDER — FENTANYL CITRATE (PF) 100 MCG/2ML IJ SOLN
INTRAMUSCULAR | Status: AC
  Filled 2018-09-12: qty 2

## 2018-09-12 MED ORDER — ONDANSETRON HCL 4 MG/2ML IJ SOLN
INTRAMUSCULAR | Status: DC | PRN
  Administered 2018-09-12: 4 mg via INTRAVENOUS

## 2018-09-12 MED ORDER — SODIUM CHLORIDE 0.9 % IV SOLN: INTRAVENOUS | Status: DC | PRN

## 2018-09-12 MED ORDER — LIDOCAINE 2% (20 MG/ML) 5 ML SYRINGE
INTRAMUSCULAR | Status: AC
  Filled 2018-09-12: qty 5

## 2018-09-12 MED ORDER — PROMETHAZINE HCL 25 MG/ML IJ SOLN: 6.2500 mg | INTRAMUSCULAR | Status: DC | PRN

## 2018-09-12 MED ORDER — LIDOCAINE HCL 1 % IJ SOLN
INTRAMUSCULAR | Status: DC | PRN
  Administered 2018-09-12: 15 mL

## 2018-09-12 MED ORDER — ROCURONIUM BROMIDE 50 MG/5ML IV SOSY
PREFILLED_SYRINGE | INTRAVENOUS | Status: DC | PRN
  Administered 2018-09-12: 60 mg via INTRAVENOUS

## 2018-09-12 MED ORDER — DIPHENHYDRAMINE HCL 50 MG/ML IJ SOLN
INTRAMUSCULAR | Status: AC
  Filled 2018-09-12: qty 1

## 2018-09-12 MED ORDER — PROPOFOL 10 MG/ML IV BOLUS
INTRAVENOUS | Status: AC
  Filled 2018-09-12: qty 20

## 2018-09-12 MED ORDER — FENTANYL CITRATE (PF) 100 MCG/2ML IJ SOLN
INTRAMUSCULAR | Status: DC | PRN
  Administered 2018-09-12: 100 ug via INTRAVENOUS
  Administered 2018-09-12 (×2): 50 ug via INTRAVENOUS

## 2018-09-12 MED ORDER — PROPOFOL 10 MG/ML IV BOLUS
INTRAVENOUS | Status: DC | PRN
  Administered 2018-09-12: 200 mg via INTRAVENOUS

## 2018-09-12 MED ORDER — 0.9 % SODIUM CHLORIDE (POUR BTL) OPTIME
TOPICAL | Status: DC | PRN
  Administered 2018-09-12: 1000 mL

## 2018-09-12 MED ORDER — LIDOCAINE 2% (20 MG/ML) 5 ML SYRINGE
INTRAMUSCULAR | Status: DC | PRN
  Administered 2018-09-12: 100 mg via INTRAVENOUS

## 2018-09-12 MED ORDER — DEXAMETHASONE SODIUM PHOSPHATE 10 MG/ML IJ SOLN
INTRAMUSCULAR | Status: DC | PRN
  Administered 2018-09-12: 10 mg via INTRAVENOUS

## 2018-09-12 MED ORDER — ROCURONIUM BROMIDE 10 MG/ML (PF) SYRINGE
PREFILLED_SYRINGE | INTRAVENOUS | Status: AC
  Filled 2018-09-12: qty 10

## 2018-09-12 MED ORDER — BUPIVACAINE-EPINEPHRINE (PF) 0.25% -1:200000 IJ SOLN
INTRAMUSCULAR | Status: AC
  Filled 2018-09-12: qty 30

## 2018-09-12 MED ORDER — EPHEDRINE SULFATE-NACL 50-0.9 MG/10ML-% IV SOSY
PREFILLED_SYRINGE | INTRAVENOUS | Status: DC | PRN
  Administered 2018-09-12: 15 mg via INTRAVENOUS
  Administered 2018-09-12 (×2): 10 mg via INTRAVENOUS

## 2018-09-12 MED ORDER — SUGAMMADEX SODIUM 500 MG/5ML IV SOLN
INTRAVENOUS | Status: DC | PRN
  Administered 2018-09-12: 230 mg via INTRAVENOUS

## 2018-09-12 MED ORDER — HYDROCODONE-ACETAMINOPHEN 10-325 MG PO TABS: 1.0000 | ORAL_TABLET | Freq: Four times a day (QID) | ORAL | 0 refills | Status: DC | PRN

## 2018-09-12 MED ORDER — FENTANYL CITRATE (PF) 100 MCG/2ML IJ SOLN
25.0000 ug | INTRAMUSCULAR | Status: DC | PRN
  Administered 2018-09-12: 50 ug via INTRAVENOUS

## 2018-09-12 MED ORDER — SCOPOLAMINE 1 MG/3DAYS TD PT72
MEDICATED_PATCH | TRANSDERMAL | Status: AC
  Filled 2018-09-12: qty 1

## 2018-09-12 MED ORDER — MIDAZOLAM HCL 2 MG/2ML IJ SOLN
INTRAMUSCULAR | Status: AC
  Filled 2018-09-12: qty 2

## 2018-09-12 MED ORDER — DIPHENHYDRAMINE HCL 50 MG/ML IJ SOLN
INTRAMUSCULAR | Status: DC | PRN
  Administered 2018-09-12: 25 mg via INTRAVENOUS

## 2018-09-12 MED ORDER — SODIUM CHLORIDE 0.9 % IV SOLN
INTRAVENOUS | Status: DC | PRN
  Administered 2018-09-12: 09:00:00 50 ug/min via INTRAVENOUS

## 2018-09-12 MED ORDER — ONDANSETRON HCL 4 MG/2ML IJ SOLN
INTRAMUSCULAR | Status: AC
  Filled 2018-09-12: qty 2

## 2018-09-12 MED ORDER — LIDOCAINE HCL (PF) 1 % IJ SOLN
INTRAMUSCULAR | Status: AC
  Filled 2018-09-12: qty 30

## 2018-09-12 MED ORDER — LIDOCAINE HCL 1 % IJ SOLN
INTRAMUSCULAR | Status: AC
  Filled 2018-09-12: qty 20

## 2018-09-12 MED ORDER — LACTATED RINGERS IV SOLN
INTRAVENOUS | Status: DC
  Administered 2018-09-12 (×2): via INTRAVENOUS

## 2018-09-12 MED ORDER — DEXAMETHASONE SODIUM PHOSPHATE 10 MG/ML IJ SOLN
INTRAMUSCULAR | Status: AC
  Filled 2018-09-12: qty 1

## 2018-09-12 MED ORDER — MIDAZOLAM HCL 5 MG/5ML IJ SOLN
INTRAMUSCULAR | Status: DC | PRN
  Administered 2018-09-12: 2 mg via INTRAVENOUS

## 2018-09-12 MED ORDER — FENTANYL CITRATE (PF) 250 MCG/5ML IJ SOLN
INTRAMUSCULAR | Status: AC
  Filled 2018-09-12: qty 5

## 2018-09-12 MED ORDER — PHENYLEPHRINE 40 MCG/ML (10ML) SYRINGE FOR IV PUSH (FOR BLOOD PRESSURE SUPPORT)
PREFILLED_SYRINGE | INTRAVENOUS | Status: DC | PRN
  Administered 2018-09-12: 160 ug via INTRAVENOUS
  Administered 2018-09-12: 200 ug via INTRAVENOUS
  Administered 2018-09-12: 120 ug via INTRAVENOUS
  Administered 2018-09-12: 160 ug via INTRAVENOUS

## 2018-09-12 SURGICAL SUPPLY — 49 items
ADH SKN CLS APL DERMABOND .7 (GAUZE/BANDAGES/DRESSINGS) ×1
APL PRP STRL LF DISP 70% ISPRP (MISCELLANEOUS) ×1
APPLIER CLIP ROT 10 11.4 M/L (STAPLE) ×3
APR CLP MED LRG 11.4X10 (STAPLE) ×1
BAG SPEC RTRVL 10 TROC 200 (ENDOMECHANICALS) ×1
BLADE CLIPPER SURG (BLADE) IMPLANT
CANISTER SUCT 3000ML PPV (MISCELLANEOUS) ×3 IMPLANT
CHLORAPREP W/TINT 26 (MISCELLANEOUS) ×3 IMPLANT
CLIP APPLIE ROT 10 11.4 M/L (STAPLE) ×1 IMPLANT
CLIP VESOLOCK XL 6/CT (CLIP) IMPLANT
CONT SPEC 4OZ CLIKSEAL STRL BL (MISCELLANEOUS) ×2 IMPLANT
COVER MAYO STAND STRL (DRAPES) ×1 IMPLANT
COVER SURGICAL LIGHT HANDLE (MISCELLANEOUS) ×3 IMPLANT
COVER WAND RF STERILE (DRAPES) ×3 IMPLANT
DERMABOND ADVANCED (GAUZE/BANDAGES/DRESSINGS) ×2
DERMABOND ADVANCED .7 DNX12 (GAUZE/BANDAGES/DRESSINGS) ×1 IMPLANT
DRAPE C-ARM 42X72 X-RAY (DRAPES) ×1 IMPLANT
DRAPE WARM FLUID 44X44 (DRAPES) ×1 IMPLANT
ELECT REM PT RETURN 9FT ADLT (ELECTROSURGICAL) ×3
ELECTRODE REM PT RTRN 9FT ADLT (ELECTROSURGICAL) ×1 IMPLANT
FILTER SMOKE EVAC LAPAROSHD (FILTER) IMPLANT
GLOVE BIO SURGEON STRL SZ 6 (GLOVE) ×3 IMPLANT
GLOVE INDICATOR 6.5 STRL GRN (GLOVE) ×3 IMPLANT
GOWN STRL REUS W/ TWL LRG LVL3 (GOWN DISPOSABLE) ×2 IMPLANT
GOWN STRL REUS W/TWL 2XL LVL3 (GOWN DISPOSABLE) ×3 IMPLANT
GOWN STRL REUS W/TWL LRG LVL3 (GOWN DISPOSABLE) ×6
KIT BASIN OR (CUSTOM PROCEDURE TRAY) ×3 IMPLANT
KIT TURNOVER KIT B (KITS) ×3 IMPLANT
L-HOOK LAP DISP 36CM (ELECTROSURGICAL) ×3
LHOOK LAP DISP 36CM (ELECTROSURGICAL) ×1 IMPLANT
NS IRRIG 1000ML POUR BTL (IV SOLUTION) ×3 IMPLANT
PAD ARMBOARD 7.5X6 YLW CONV (MISCELLANEOUS) ×3 IMPLANT
PENCIL BUTTON HOLSTER BLD 10FT (ELECTRODE) ×3 IMPLANT
POUCH RETRIEVAL ECOSAC 10 (ENDOMECHANICALS) ×1 IMPLANT
POUCH RETRIEVAL ECOSAC 10MM (ENDOMECHANICALS) ×2
SCISSORS LAP 5X35 DISP (ENDOMECHANICALS) ×3 IMPLANT
SET CHOLANGIOGRAPH 5 50 .035 (SET/KITS/TRAYS/PACK) IMPLANT
SET IRRIG TUBING LAPAROSCOPIC (IRRIGATION / IRRIGATOR) ×3 IMPLANT
SET TUBE SMOKE EVAC HIGH FLOW (TUBING) ×3 IMPLANT
SLEEVE ENDOPATH XCEL 5M (ENDOMECHANICALS) ×6 IMPLANT
SPECIMEN JAR SMALL (MISCELLANEOUS) ×3 IMPLANT
SUT MNCRL AB 4-0 PS2 18 (SUTURE) ×5 IMPLANT
TOWEL GREEN STERILE (TOWEL DISPOSABLE) ×3 IMPLANT
TOWEL GREEN STERILE FF (TOWEL DISPOSABLE) ×3 IMPLANT
TRAY LAPAROSCOPIC MC (CUSTOM PROCEDURE TRAY) ×3 IMPLANT
TROCAR XCEL BLUNT TIP 100MML (ENDOMECHANICALS) ×3 IMPLANT
TROCAR XCEL NON-BLD 11X100MML (ENDOMECHANICALS) ×3 IMPLANT
TROCAR XCEL NON-BLD 5MMX100MML (ENDOMECHANICALS) ×3 IMPLANT
WATER STERILE IRR 1000ML POUR (IV SOLUTION) ×3 IMPLANT

## 2018-09-12 NOTE — Op Note (Signed)
Laparoscopic Cholecystectomy  Indications: This patient presents with symptomatic cholelithiasis and probable early acute cholecystitis.   Pre-operative Diagnosis: symptomatic cholelithiasis  Post-operative Diagnosis: acute cholecystitis  Surgeon: Stark Klein   Assistants: Ewell Poe, RNFA  Anesthesia: General endotracheal anesthesia and local  ASA Class: 3  Procedure Details  The patient was seen again in the Holding Room. The risks, benefits, complications, treatment options, and expected outcomes were discussed with the patient. The possibilities of  bleeding, recurrent infection, damage to nearby structures, the need for additional procedures, failure to diagnose a condition, the possible need to convert to an open procedure, and creating a complication requiring transfusion or operation were discussed with the patient. The likelihood of improving the patient's symptoms with return to their baseline status is good.    The patient and/or family concurred with the proposed plan, giving informed consent. The site of surgery properly noted. The patient was taken to Operating Room, and the procedure verified as Laparoscopic Cholecystectomy with possible Intraoperative Cholangiogram. A Time Out was held and the above information confirmed.  Prior to the induction of general anesthesia, antibiotic prophylaxis was administered. General endotracheal anesthesia was then administered and tolerated well. After the induction, the abdomen was prepped with Chloraprep and draped in the sterile fashion. The patient was positioned in the supine position.  Local anesthetic agent was injected into the skin near the umbilicus and an incision made. We dissected down to the abdominal fascia with blunt dissection.  The fascia was incised vertically and we entered the peritoneal cavity bluntly.  A pursestring suture of 0-Vicryl was placed around the fascial opening.  The Hasson cannula was inserted and  secured with the stay suture.  Pneumoperitoneum was then created with CO2 and tolerated well without any adverse changes in the patient's vital signs. An 11-mm port was placed in the subxiphoid position.  Two 5-mm ports were placed in the right upper quadrant. All skin incisions were infiltrated with a local anesthetic agent before making the incision and placing the trocars.   We positioned the patient in reverse Trendelenburg, tilted slightly to the patient's left.  The gallbladder was identified and was very intrahepatic.  The fundus was grasped and retracted cephalad, but the liver was very heavy and it could not be retracted sufficiently to see the infundibulum.  A fifth trocar (5 mm) was placed in the upper midline between the periumbilical port and the epigastric port. Adhesions were lysed bluntly and with the electrocautery where indicated, taking care not to injure any adjacent organs or viscus. The infundibulum was grasped and retracted laterally, exposing the peritoneum overlying the triangle of Calot. This was then divided and exposed in a blunt fashion. A critical view of the cystic duct and cystic artery was obtained.  There was a tiny branch as well that was seen coming off the cystic artery.  The cystic duct was clearly identified and bluntly dissected circumferentially.   The cystic duct was then ligated with clips and divided. Three clips were placed on the patient side and one on the specimen side.   The cystic artery was identified, dissected free, ligated with clips and divided as well. The small arterial branch was clipped and divided.    The gallbladder was dissected from the liver bed in retrograde fashion with the electrocautery. The gallbladder was removed and placed in an Endocatch bag.  The gallbladder and Endocatch bag were then removed through the umbilical port site.  The liver bed was irrigated and inspected. Hemostasis was achieved  with the electrocautery. Copious irrigation  was utilized and was repeatedly aspirated until clear.    We again inspected the right upper quadrant for hemostasis.  Pneumoperitoneum was released as we removed the trocars.   The pursestring suture was used to close the umbilical fascia.  4-0 Monocryl was used to close the skin.   The skin was cleaned and dry, and Dermabond was applied. The patient was then extubated and brought to the recovery room in stable condition. Instrument, sponge, and needle counts were correct at closure and at the conclusion of the case.   Findings: Very intrahepatic gallbladder.  Mild inflammation  Estimated Blood Loss: min         Drains: none.            Specimens: Gallbladder to pathology       Complications: None; patient tolerated the procedure well.         Disposition: PACU - hemodynamically stable.         Condition: stable

## 2018-09-12 NOTE — Anesthesia Procedure Notes (Signed)
Procedure Name: Intubation Date/Time: 09/12/2018 8:05 AM Performed by: Lance Coon, CRNA Pre-anesthesia Checklist: Patient identified, Emergency Drugs available, Suction available, Patient being monitored and Timeout performed Patient Re-evaluated:Patient Re-evaluated prior to induction Oxygen Delivery Method: Circle system utilized Preoxygenation: Pre-oxygenation with 100% oxygen Induction Type: IV induction Ventilation: Mask ventilation with difficulty Laryngoscope Size: Miller and 3 Grade View: Grade II Tube type: Oral Tube size: 7.0 mm Number of attempts: 1 Airway Equipment and Method: Stylet Placement Confirmation: ETT inserted through vocal cords under direct vision,  positive ETCO2 and breath sounds checked- equal and bilateral Secured at: 21 cm Tube secured with: Tape Dental Injury: Teeth and Oropharynx as per pre-operative assessment

## 2018-09-12 NOTE — Discharge Instructions (Signed)
[facility] ______[facility] °LAPAROSCOPIC SURGERY: POST OP INSTRUCTIONS °Always review your discharge instruction sheet given to you by the facility where your surgery was performed. °IF YOU HAVE DISABILITY OR FAMILY LEAVE FORMS, YOU MUST BRING THEM TO THE OFFICE FOR PROCESSING.   °DO NOT GIVE THEM TO YOUR DOCTOR. ° °1. A prescription for pain medication may be given to you upon discharge.  Take your pain medication as prescribed, if needed.  If narcotic pain medicine is not needed, then you may take acetaminophen (Tylenol) or ibuprofen (Advil) as needed. °2. Take your usually prescribed medications unless otherwise directed. °3. If you need a refill on your pain medication, please contact your pharmacy.  They will contact our office to request authorization. Prescriptions will not be filled after 5pm or on week-ends. °4. You should follow a light diet the first few days after arrival home, such as soup and crackers, etc.  Be sure to include lots of fluids daily. °5. Most patients will experience some swelling and bruising in the area of the incisions.  Ice packs will help.  Swelling and bruising can take several days to resolve.  °6. It is common to experience some constipation if taking pain medication after surgery.  Increasing fluid intake and taking a stool softener (such as Colace) will usually help or prevent this problem from occurring.  A mild laxative (Milk of Magnesia or Miralax) should be taken according to package instructions if there are no bowel movements after 48 hours. °7. Unless discharge instructions indicate otherwise, you may remove your bandages 24-48 hours after surgery, and you may shower at that time.  You may have steri-strips (small skin tapes) in place directly over the incision.  These strips should be left on the skin for 7-10 days.  If your surgeon used skin glue on the incision, you may shower in 24 hours.  The glue will flake off over the next 2-3 weeks.  Any sutures or  staples will be removed at the office during your follow-up visit. °8. ACTIVITIES:  You may resume regular (light) daily activities beginning the next day--such as daily self-care, walking, climbing stairs--gradually increasing activities as tolerated.  You may have sexual intercourse when it is comfortable.  Refrain from any heavy lifting or straining until approved by your doctor. °a. You may drive when you are no longer taking prescription pain medication, you can comfortably wear a seatbelt, and you can safely maneuver your car and apply brakes. °b. RETURN TO WORK:  __________________________________________________________ °9. You should see your doctor in the office for a follow-up appointment approximately 2-3 weeks after your surgery.  Make sure that you call for this appointment within a day or two after you arrive home to insure a convenient appointment time. °10. OTHER INSTRUCTIONS: __________________________________________________________________________________________________________________________ __________________________________________________________________________________________________________________________ °WHEN TO CALL YOUR DOCTOR: °1. Fever over 101.0 °2. Inability to urinate °3. Continued bleeding from incision. °4. Increased pain, redness, or drainage from the incision. °5. Increasing abdominal pain ° °The clinic staff is available to answer your questions during regular business hours.  Please don’t hesitate to call and ask to speak to one of the nurses for clinical concerns.  If you have a medical emergency, go to the nearest emergency room or call 911.  A surgeon from [facility] is always on call at the hospital. °[address], [address], [place], [place]  [address] ? [address] [address], [place], [place]   [address] °[phone] ? [phone] ? FAX [phone] °Web site:   [email or web address] °

## 2018-09-12 NOTE — Interval H&P Note (Signed)
History and Physical Interval Note:  09/12/2018 7:50 AM  [name]  has presented today for surgery, with the diagnosis of SYMPTOMATIC CHOLELITHIASIS.  The various methods of treatment have been discussed with the patient and family. After consideration of risks, benefits and other options for treatment, the patient has consented to  Procedure(s): LAPAROSCOPIC CHOLECYSTECTOMY WITH INTRAOPERATIVE CHOLANGIOGRAM (N/A) as a surgical intervention.  The patient's history has been reviewed, patient examined, no change in status, stable for surgery.  I have reviewed the patient's chart and labs.  Questions were answered to the patient's satisfaction.     Stark Klein

## 2018-09-12 NOTE — Progress Notes (Signed)
Received pt back from PACU, S/P Lap Chole, 4 sites, clean dry and intact.  BP elevated a little will keep watching that.

## 2018-09-12 NOTE — Progress Notes (Signed)
Has stones on prior ultrasound. Plan surgery for inability to be pain free.  Discussed risks w pt.

## 2018-09-12 NOTE — Progress Notes (Signed)
Called report to Anesthesia.

## 2018-09-12 NOTE — Anesthesia Postprocedure Evaluation (Signed)
Anesthesia Post Note  Patient: [name]  Procedure(s) Performed: LAPAROSCOPIC CHOLECYSTECTOMY (N/A Abdomen)     Patient location during evaluation: PACU Anesthesia Type: General Level of consciousness: awake and alert Pain management: pain level controlled Vital Signs Assessment: post-procedure vital signs reviewed and stable Respiratory status: spontaneous breathing, nonlabored ventilation, respiratory function stable and patient connected to nasal cannula oxygen Cardiovascular status: blood pressure returned to baseline and stable Postop Assessment: no apparent nausea or vomiting Anesthetic complications: no    Last Vitals:  Vitals:   09/12/18 1016 09/12/18 1359  BP: (!) 167/94 (!) 161/100  Pulse: 96 (!) 106  Resp: 15 15  Temp: 36.9 C 37 C  SpO2: 96% 97%    Last Pain:  Vitals:   09/12/18 1613  TempSrc:   PainSc: 4                  Catalina Gravel

## 2018-09-12 NOTE — Transfer of Care (Signed)
Immediate Anesthesia Transfer of Care Note  Patient: [name]  Procedure(s) Performed: LAPAROSCOPIC CHOLECYSTECTOMY (N/A Abdomen)  Patient Location: PACU  Anesthesia Type:General  Level of Consciousness: awake and drowsy  Airway & Oxygen Therapy: Patient Spontanous Breathing  Post-op Assessment: Report given to RN and Post -op Vital signs reviewed and stable  Post vital signs: Reviewed and stable  Last Vitals:  Vitals Value Taken Time  BP 180/87 09/12/18 0938  Temp    Pulse 87 09/12/18 0938  Resp 16 09/12/18 0938  SpO2 100 % 09/12/18 0938  Vitals shown include unvalidated device data.  Last Pain:  Vitals:   09/12/18 0546  TempSrc:   PainSc: 3          Complications: No apparent anesthesia complications

## 2018-09-12 NOTE — Progress Notes (Signed)
Spoke with Dr Holland Falling re: 202 cbg--> will have Rx per floor according to orders on [facility] / seen and eval'd at bedside  / alert, stable and drinking flds

## 2018-09-13 ENCOUNTER — Encounter (HOSPITAL_COMMUNITY): Payer: Self-pay | Admitting: General Surgery

## 2018-09-13 LAB — GLUCOSE, CAPILLARY
Glucose-Capillary: 242 mg/dL — ABNORMAL HIGH (ref 70–99)
Glucose-Capillary: 184 mg/dL — ABNORMAL HIGH (ref 70–99)

## 2018-09-13 LAB — HIV ANTIBODY (ROUTINE TESTING W REFLEX): HIV Screen 4th Generation wRfx: NONREACTIVE

## 2018-09-13 MED ORDER — OXYCODONE HCL 5 MG PO TABS: 5.0000 mg | ORAL_TABLET | ORAL | 0 refills | Status: DC | PRN

## 2018-09-13 NOTE — Plan of Care (Signed)
  Problem: Education: Goal: Knowledge of General Education information will improve Description Including pain rating scale, medication(s)/side effects and non-pharmacologic comfort measures Outcome: Progressing   Problem: Health Behavior/Discharge Planning: Goal: Ability to manage health-related needs will improve Outcome: Progressing   Problem: Clinical Measurements: Goal: Ability to maintain clinical measurements within normal limits will improve Outcome: Progressing Goal: Will remain free from infection Outcome: Progressing Goal: Diagnostic test results will improve Outcome: Progressing Goal: Respiratory complications will improve Outcome: Progressing Goal: Cardiovascular complication will be avoided Outcome: Progressing   Problem: Activity: Goal: Risk for activity intolerance will decrease Outcome: Progressing   Problem: Nutrition: Goal: Adequate nutrition will be maintained Outcome: Progressing   Problem: Coping: Goal: Level of anxiety will decrease Outcome: Progressing   Problem: Elimination: Goal: Will not experience complications related to bowel motility Outcome: Progressing Goal: Will not experience complications related to urinary retention Outcome: Progressing   Problem: Pain Managment: Goal: General experience of comfort will improve Outcome: Progressing   Problem: Safety: Goal: Ability to remain free from injury will improve Outcome: Progressing   Problem: Skin Integrity: Goal: Risk for impaired skin integrity will decrease Outcome: Progressing   Problem: Clinical Measurements: Goal: Postoperative complications will be avoided or minimized Outcome: Progressing   Problem: Skin Integrity: Goal: Demonstration of wound healing without infection will improve Outcome: Progressing   

## 2018-09-13 NOTE — Progress Notes (Signed)
Discharged Pt to home. Instructions given and explained. Belongings returned.

## 2018-09-13 NOTE — Discharge Summary (Signed)
Physician Discharge Summary  Patient ID: [name] MRN: [id number] DOB/AGE: 58/05/1960 58 y.o.  PCP: Carlena Hurl, PA-C  Admit date: 09/11/2018 Discharge date: 09/13/2018  Admission Diagnoses:  cholecystitis  Discharge Diagnoses:  same  Active Problems:   Symptomatic cholelithiasis   Surgery:  Laparoscopic cholecystectomy  Discharged Condition: improved  Hospital Course:   Had surgery on Friday, July 3 and ready for discharge on Saturday, July 4th.  Incisions OK.  Blood sugars noted and patient will follow up with Dr. Loanne Drilling  Consults: none  Significant Diagnostic Studies: none    Discharge Exam: Blood pressure 135/82, pulse 99, temperature 98.5 F (36.9 C), temperature source Oral, resp. rate 18, height 5\' 3"  (1.6 m), weight 113.8 kg, last menstrual period 02/09/2017, SpO2 97 %. Incisions OK  Disposition: Discharge disposition: 01-Home or Self Care       Discharge Instructions    Call MD for:  redness, tenderness, or signs of infection (pain, swelling, redness, odor or green/yellow discharge around incision site)   Complete by: As directed    Call MD for:  severe uncontrolled pain   Complete by: As directed    Diet - low sodium heart healthy   Complete by: As directed    Discharge instructions   Complete by: As directed    Check blood sugars and followup with Dr. Loanne Drilling as needed for optimal diabetes management   Increase activity slowly   Complete by: As directed      Allergies as of 09/13/2018      Reactions   Tramadol Nausea Only      Medication List    TAKE these medications   atorvastatin 20 MG tablet Commonly known as: LIPITOR TAKE 1 TABLET BY MOUTH AT BEDTIME   Basaglar KwikPen 100 UNIT/ML Sopn Inject 1.4 mLs (140 Units total) into the skin every morning. And pen needles 1/day   carvedilol 3.125 MG tablet Commonly known as: COREG Take 1 tablet (3.125 mg total) by mouth 2 (two) times daily. What changed: when to take this    dexlansoprazole 60 MG capsule Commonly known as: Dexilant Take 1 capsule (60 mg total) by mouth daily.   diclofenac 50 MG EC tablet Commonly known as: VOLTAREN Take 1 tablet by mouth twice daily   DULoxetine 60 MG capsule Commonly known as: CYMBALTA Take 1 capsule by mouth twice daily   furosemide 80 MG tablet Commonly known as: LASIX Take 1 tablet by mouth once daily   HYDROcodone-acetaminophen 10-325 MG tablet Commonly known as: NORCO Take 1-2 tablets by mouth every 6 (six) hours as needed for moderate pain. What changed:   how much to take  reasons to take this   LORazepam 0.5 MG tablet Commonly known as: ATIVAN Take 1 tablet (0.5 mg total) by mouth 2 (two) times daily as needed. for anxiety   nortriptyline 25 MG capsule Commonly known as: Pamelor Take 1 capsule (25 mg total) by mouth at bedtime.   oxyCODONE 5 MG immediate release tablet Commonly known as: Oxy IR/ROXICODONE Take 1-2 tablets (5-10 mg total) by mouth every 4 (four) hours as needed for moderate pain.      Follow-up Information    [facility], [place].   Specialty: General Surgery Contact information: [address] [place] [address] [phone]           Signed: Pedro Earls 09/13/2018, 8:27 AM

## 2018-09-13 NOTE — Progress Notes (Signed)
Paged Dr. Barry Dienes due to patient's CBG of [phone] and received an order for a stat lab verification to see if still in sliding scale. Will implement these orders and continue to monitor.

## 2018-09-15 ENCOUNTER — Ambulatory Visit: Payer: BC Managed Care – PPO | Admitting: Endocrinology

## 2018-09-16 ENCOUNTER — Telehealth: Payer: Self-pay | Admitting: *Deleted

## 2018-09-16 NOTE — Telephone Encounter (Signed)
Oral swab drug screen was consistent for prescribed medications.  ?

## 2018-09-17 ENCOUNTER — Other Ambulatory Visit: Payer: BC Managed Care – PPO

## 2018-09-19 ENCOUNTER — Other Ambulatory Visit: Payer: Self-pay

## 2018-09-19 ENCOUNTER — Observation Stay (HOSPITAL_COMMUNITY): Payer: BC Managed Care – PPO | Admitting: Critical Care Medicine

## 2018-09-19 ENCOUNTER — Encounter (HOSPITAL_COMMUNITY): Payer: Self-pay | Admitting: Emergency Medicine

## 2018-09-19 ENCOUNTER — Encounter (HOSPITAL_COMMUNITY): Admission: EM | Disposition: A | Discharge status: Home / Self Care | Payer: Self-pay | Source: Home / Self Care

## 2018-09-19 ENCOUNTER — Inpatient Hospital Stay (HOSPITAL_COMMUNITY)
Admission: EM | Admit: 2018-09-19 | Discharge: 2018-09-21 | DRG: 342 | Disposition: A | Discharge status: Home / Self Care | Payer: BC Managed Care – PPO | Attending: Surgery | Admitting: Surgery

## 2018-09-19 ENCOUNTER — Ambulatory Visit: Payer: BC Managed Care – PPO | Admitting: Medical

## 2018-09-19 ENCOUNTER — Emergency Department (HOSPITAL_COMMUNITY): Payer: BC Managed Care – PPO

## 2018-09-19 DIAGNOSIS — F329 Major depressive disorder, single episode, unspecified: Secondary | ICD-10-CM | POA: Diagnosis present

## 2018-09-19 DIAGNOSIS — I10 Essential (primary) hypertension: Secondary | ICD-10-CM | POA: Diagnosis present

## 2018-09-19 DIAGNOSIS — F410 Panic disorder [episodic paroxysmal anxiety] without agoraphobia: Secondary | ICD-10-CM | POA: Diagnosis present

## 2018-09-19 DIAGNOSIS — Z6841 Body Mass Index (BMI) 40.0 and over, adult: Secondary | ICD-10-CM

## 2018-09-19 DIAGNOSIS — K219 Gastro-esophageal reflux disease without esophagitis: Secondary | ICD-10-CM | POA: Diagnosis present

## 2018-09-19 DIAGNOSIS — Z833 Family history of diabetes mellitus: Secondary | ICD-10-CM

## 2018-09-19 DIAGNOSIS — G894 Chronic pain syndrome: Secondary | ICD-10-CM | POA: Diagnosis present

## 2018-09-19 DIAGNOSIS — E66811 Obesity, class 1: Secondary | ICD-10-CM | POA: Diagnosis present

## 2018-09-19 DIAGNOSIS — F4310 Post-traumatic stress disorder, unspecified: Secondary | ICD-10-CM | POA: Diagnosis present

## 2018-09-19 DIAGNOSIS — E119 Type 2 diabetes mellitus without complications: Secondary | ICD-10-CM | POA: Diagnosis present

## 2018-09-19 DIAGNOSIS — IMO0002 Reserved for concepts with insufficient information to code with codable children: Secondary | ICD-10-CM | POA: Diagnosis present

## 2018-09-19 DIAGNOSIS — K3580 Unspecified acute appendicitis: Secondary | ICD-10-CM | POA: Diagnosis not present

## 2018-09-19 DIAGNOSIS — K5900 Constipation, unspecified: Secondary | ICD-10-CM | POA: Diagnosis present

## 2018-09-19 DIAGNOSIS — Z794 Long term (current) use of insulin: Secondary | ICD-10-CM

## 2018-09-19 DIAGNOSIS — E669 Obesity, unspecified: Secondary | ICD-10-CM | POA: Diagnosis present

## 2018-09-19 DIAGNOSIS — E1165 Type 2 diabetes mellitus with hyperglycemia: Secondary | ICD-10-CM | POA: Diagnosis present

## 2018-09-19 DIAGNOSIS — Z791 Long term (current) use of non-steroidal anti-inflammatories (NSAID): Secondary | ICD-10-CM

## 2018-09-19 DIAGNOSIS — Z79899 Other long term (current) drug therapy: Secondary | ICD-10-CM

## 2018-09-19 DIAGNOSIS — Z1159 Encounter for screening for other viral diseases: Secondary | ICD-10-CM

## 2018-09-19 DIAGNOSIS — R1031 Right lower quadrant pain: Secondary | ICD-10-CM | POA: Diagnosis not present

## 2018-09-19 DIAGNOSIS — E785 Hyperlipidemia, unspecified: Secondary | ICD-10-CM | POA: Diagnosis present

## 2018-09-19 HISTORY — PX: LAPAROSCOPIC APPENDECTOMY: SHX408

## 2018-09-19 LAB — URINALYSIS, ROUTINE W REFLEX MICROSCOPIC
Bilirubin Urine: NEGATIVE
Glucose, UA: >=500 mg/dL — AB
Hgb urine dipstick: NEGATIVE
Ketones, ur: NEGATIVE mg/dL
Nitrite: NEGATIVE
Protein, ur: NEGATIVE mg/dL
Specific Gravity, Urine: 1.031 — ABNORMAL HIGH (ref 1.005–1.030)
pH: 6 (ref 5.0–8.0)

## 2018-09-19 LAB — SURGICAL PCR SCREEN
MRSA, PCR: NEGATIVE
Staphylococcus aureus: NEGATIVE

## 2018-09-19 LAB — GLUCOSE, CAPILLARY
Glucose-Capillary: 319 mg/dL — ABNORMAL HIGH (ref 70–99)
Glucose-Capillary: 359 mg/dL — ABNORMAL HIGH (ref 70–99)
Glucose-Capillary: 285 mg/dL — ABNORMAL HIGH (ref 70–99)
Glucose-Capillary: 341 mg/dL — ABNORMAL HIGH (ref 70–99)

## 2018-09-19 LAB — COMPREHENSIVE METABOLIC PANEL
ALT: 34 U/L (ref 0–44)
AST: 22 U/L (ref 15–41)
Albumin: 2.8 g/dL — ABNORMAL LOW (ref 3.5–5.0)
Alkaline Phosphatase: 140 U/L — ABNORMAL HIGH (ref 38–126)
Anion gap: 11 (ref 5–15)
BUN: 10 mg/dL (ref 6–20)
CO2: 25 mmol/L (ref 22–32)
Calcium: 8.9 mg/dL (ref 8.9–10.3)
Chloride: 101 mmol/L (ref 98–111)
Creatinine, Ser: 1.28 mg/dL — ABNORMAL HIGH (ref 0.44–1.00)
GFR calc Af Amer: 54 mL/min — ABNORMAL LOW (ref >60)
GFR calc non Af Amer: 46 mL/min — ABNORMAL LOW (ref >60)
Glucose, Bld: 279 mg/dL — ABNORMAL HIGH (ref 70–99)
Potassium: 4.1 mmol/L (ref 3.5–5.1)
Sodium: 137 mmol/L (ref 135–145)
Total Bilirubin: 0.7 mg/dL (ref 0.3–1.2)
Total Protein: 6.5 g/dL (ref 6.5–8.1)

## 2018-09-19 LAB — SARS CORONAVIRUS 2 BY RT PCR (HOSPITAL ORDER, PERFORMED IN ~~LOC~~ HOSPITAL LAB): SARS Coronavirus 2: NEGATIVE

## 2018-09-19 LAB — CBC
HCT: 34.8 % — ABNORMAL LOW (ref 36.0–46.0)
Hemoglobin: 10.5 g/dL — ABNORMAL LOW (ref 12.0–15.0)
MCH: 22.8 pg — ABNORMAL LOW (ref 26.0–34.0)
MCHC: 30.2 g/dL (ref 30.0–36.0)
MCV: 75.7 fL — ABNORMAL LOW (ref 80.0–100.0)
Platelets: 519 10*3/uL — ABNORMAL HIGH (ref 150–400)
RBC: 4.6 MIL/uL (ref 3.87–5.11)
RDW: 14.7 % (ref 11.5–15.5)
WBC: 18.9 10*3/uL — ABNORMAL HIGH (ref 4.0–10.5)
nRBC: 0 % (ref 0.0–0.2)

## 2018-09-19 LAB — LIPASE, BLOOD: Lipase: 42 U/L (ref 11–51)

## 2018-09-19 SURGERY — APPENDECTOMY, LAPAROSCOPIC
Anesthesia: General | Site: Abdomen

## 2018-09-19 MED ORDER — 0.9 % SODIUM CHLORIDE (POUR BTL) OPTIME
TOPICAL | Status: DC | PRN
  Administered 2018-09-19: 1000 mL

## 2018-09-19 MED ORDER — SODIUM CHLORIDE 0.9 % IR SOLN
Status: DC | PRN
  Administered 2018-09-19: 1000 mL

## 2018-09-19 MED ORDER — SUCCINYLCHOLINE CHLORIDE 200 MG/10ML IV SOSY
PREFILLED_SYRINGE | INTRAVENOUS | Status: DC | PRN
  Administered 2018-09-19: 150 mg via INTRAVENOUS

## 2018-09-19 MED ORDER — GABAPENTIN 300 MG PO CAPS: 300.0000 mg | ORAL_CAPSULE | ORAL | Status: DC

## 2018-09-19 MED ORDER — SIMETHICONE 80 MG PO CHEW: 40.0000 mg | CHEWABLE_TABLET | Freq: Four times a day (QID) | ORAL | Status: DC | PRN

## 2018-09-19 MED ORDER — METHOCARBAMOL 500 MG PO TABS
500.0000 mg | ORAL_TABLET | Freq: Four times a day (QID) | ORAL | Status: DC | PRN
  Administered 2018-09-19: 500 mg via ORAL
  Filled 2018-09-19: qty 1

## 2018-09-19 MED ORDER — ONDANSETRON 4 MG PO TBDP: 4.0000 mg | ORAL_TABLET | Freq: Four times a day (QID) | ORAL | Status: DC | PRN

## 2018-09-19 MED ORDER — ACETAMINOPHEN 500 MG PO TABS: 1000.0000 mg | ORAL_TABLET | ORAL | Status: DC

## 2018-09-19 MED ORDER — LABETALOL HCL 5 MG/ML IV SOLN
INTRAVENOUS | Status: AC
  Filled 2018-09-19: qty 4

## 2018-09-19 MED ORDER — IOHEXOL 300 MG/ML  SOLN
100.0000 mL | Freq: Once | INTRAMUSCULAR | Status: AC | PRN
  Administered 2018-09-19: 100 mL via INTRAVENOUS

## 2018-09-19 MED ORDER — BUPIVACAINE-EPINEPHRINE 0.25% -1:200000 IJ SOLN
INTRAMUSCULAR | Status: DC | PRN
  Administered 2018-09-19: 15 mL

## 2018-09-19 MED ORDER — SODIUM CHLORIDE 0.9 % IV SOLN
2.0000 g | Freq: Once | INTRAVENOUS | Status: DC
  Filled 2018-09-19: qty 20

## 2018-09-19 MED ORDER — DEXTROSE-NACL 5-0.45 % IV SOLN
INTRAVENOUS | Status: DC
  Administered 2018-09-19: 12:00:00 via INTRAVENOUS

## 2018-09-19 MED ORDER — METRONIDAZOLE IN NACL 5-0.79 MG/ML-% IV SOLN
500.0000 mg | Freq: Three times a day (TID) | INTRAVENOUS | Status: DC
  Administered 2018-09-19 – 2018-09-21 (×5): 500 mg via INTRAVENOUS
  Filled 2018-09-19 (×4): qty 100

## 2018-09-19 MED ORDER — INSULIN ASPART 100 UNIT/ML ~~LOC~~ SOLN
0.0000 [IU] | SUBCUTANEOUS | Status: DC
  Administered 2018-09-19 (×2): 11 [IU] via SUBCUTANEOUS
  Administered 2018-09-19: 8 [IU] via SUBCUTANEOUS
  Administered 2018-09-19: 15 [IU] via SUBCUTANEOUS

## 2018-09-19 MED ORDER — PROMETHAZINE HCL 25 MG/ML IJ SOLN: 6.2500 mg | INTRAMUSCULAR | Status: DC | PRN

## 2018-09-19 MED ORDER — DEXAMETHASONE SODIUM PHOSPHATE 10 MG/ML IJ SOLN
INTRAMUSCULAR | Status: DC | PRN
  Administered 2018-09-19: 4 mg via INTRAVENOUS

## 2018-09-19 MED ORDER — MORPHINE SULFATE (PF) 2 MG/ML IV SOLN
1.0000 mg | INTRAVENOUS | Status: DC | PRN
  Administered 2018-09-19: 2 mg via INTRAVENOUS
  Filled 2018-09-19: qty 1

## 2018-09-19 MED ORDER — LABETALOL HCL 5 MG/ML IV SOLN: 5.0000 mg | INTRAVENOUS | Status: DC | PRN

## 2018-09-19 MED ORDER — KETAMINE HCL 50 MG/5ML IJ SOSY
PREFILLED_SYRINGE | INTRAMUSCULAR | Status: AC
  Filled 2018-09-19: qty 5

## 2018-09-19 MED ORDER — SODIUM CHLORIDE 0.9% FLUSH
3.0000 mL | Freq: Once | INTRAVENOUS | Status: AC
  Administered 2018-09-19: 3 mL via INTRAVENOUS

## 2018-09-19 MED ORDER — SODIUM CHLORIDE 0.9 % IV SOLN
2.0000 g | INTRAVENOUS | Status: DC
  Administered 2018-09-19 – 2018-09-20 (×2): 2 g via INTRAVENOUS
  Filled 2018-09-19: qty 2
  Filled 2018-09-19: qty 20

## 2018-09-19 MED ORDER — OXYCODONE HCL 5 MG PO TABS
5.0000 mg | ORAL_TABLET | ORAL | Status: DC | PRN
  Administered 2018-09-19 – 2018-09-21 (×5): 10 mg via ORAL
  Filled 2018-09-19 (×5): qty 2

## 2018-09-19 MED ORDER — ONDANSETRON HCL 4 MG/2ML IJ SOLN
4.0000 mg | Freq: Once | INTRAMUSCULAR | Status: AC
  Administered 2018-09-19: 4 mg via INTRAVENOUS
  Filled 2018-09-19: qty 2

## 2018-09-19 MED ORDER — FENTANYL CITRATE (PF) 250 MCG/5ML IJ SOLN
INTRAMUSCULAR | Status: AC
  Filled 2018-09-19: qty 5

## 2018-09-19 MED ORDER — ACETAMINOPHEN 10 MG/ML IV SOLN
INTRAVENOUS | Status: AC
  Filled 2018-09-19: qty 100

## 2018-09-19 MED ORDER — DOCUSATE SODIUM 100 MG PO CAPS
100.0000 mg | ORAL_CAPSULE | Freq: Two times a day (BID) | ORAL | Status: DC
  Administered 2018-09-19 – 2018-09-20 (×2): 100 mg via ORAL
  Filled 2018-09-19 (×3): qty 1

## 2018-09-19 MED ORDER — ACETAMINOPHEN 325 MG PO TABS
650.0000 mg | ORAL_TABLET | Freq: Four times a day (QID) | ORAL | Status: DC | PRN
  Administered 2018-09-19: 650 mg via ORAL
  Filled 2018-09-19: qty 2

## 2018-09-19 MED ORDER — FENTANYL CITRATE (PF) 100 MCG/2ML IJ SOLN
INTRAMUSCULAR | Status: AC
  Filled 2018-09-19: qty 2

## 2018-09-19 MED ORDER — HYDROMORPHONE HCL 1 MG/ML IJ SOLN
1.0000 mg | Freq: Once | INTRAMUSCULAR | Status: AC
  Administered 2018-09-19: 1 mg via INTRAVENOUS
  Filled 2018-09-19: qty 1

## 2018-09-19 MED ORDER — CHLORHEXIDINE GLUCONATE CLOTH 2 % EX PADS: 6.0000 | MEDICATED_PAD | Freq: Once | CUTANEOUS | Status: DC

## 2018-09-19 MED ORDER — POLYETHYLENE GLYCOL 3350 17 G PO PACK
17.0000 g | PACK | Freq: Every day | ORAL | Status: DC
  Administered 2018-09-20: 17 g via ORAL
  Filled 2018-09-19 (×2): qty 1

## 2018-09-19 MED ORDER — PROPOFOL 10 MG/ML IV BOLUS
INTRAVENOUS | Status: AC
  Filled 2018-09-19: qty 20

## 2018-09-19 MED ORDER — POLYETHYLENE GLYCOL 3350 17 G PO PACK: 17.0000 g | PACK | Freq: Every day | ORAL | Status: DC

## 2018-09-19 MED ORDER — PANTOPRAZOLE SODIUM 40 MG IV SOLR
40.0000 mg | Freq: Every day | INTRAVENOUS | Status: DC
  Administered 2018-09-19: 40 mg via INTRAVENOUS
  Filled 2018-09-19: qty 40

## 2018-09-19 MED ORDER — OXYCODONE HCL 5 MG PO TABS
ORAL_TABLET | ORAL | Status: AC
  Filled 2018-09-19: qty 1

## 2018-09-19 MED ORDER — ONDANSETRON HCL 4 MG/2ML IJ SOLN
INTRAMUSCULAR | Status: AC
  Filled 2018-09-19: qty 2

## 2018-09-19 MED ORDER — HYDRALAZINE HCL 20 MG/ML IJ SOLN: 10.0000 mg | INTRAMUSCULAR | Status: DC | PRN

## 2018-09-19 MED ORDER — METRONIDAZOLE IN NACL 5-0.79 MG/ML-% IV SOLN
500.0000 mg | Freq: Once | INTRAVENOUS | Status: DC
  Filled 2018-09-19: qty 100

## 2018-09-19 MED ORDER — ACETAMINOPHEN 10 MG/ML IV SOLN
INTRAVENOUS | Status: DC | PRN
  Administered 2018-09-19: 1000 mg via INTRAVENOUS

## 2018-09-19 MED ORDER — SODIUM CHLORIDE 0.9 % IV SOLN
INTRAVENOUS | Status: DC
  Administered 2018-09-19: 20:00:00 via INTRAVENOUS

## 2018-09-19 MED ORDER — KETAMINE HCL 10 MG/ML IJ SOLN
INTRAMUSCULAR | Status: DC | PRN
  Administered 2018-09-19: 10 mg via INTRAVENOUS
  Administered 2018-09-19: 30 mg via INTRAVENOUS
  Administered 2018-09-19: 10 mg via INTRAVENOUS

## 2018-09-19 MED ORDER — LACTATED RINGERS IV SOLN
INTRAVENOUS | Status: DC
  Administered 2018-09-19: 16:00:00 via INTRAVENOUS

## 2018-09-19 MED ORDER — SUGAMMADEX SODIUM 200 MG/2ML IV SOLN
INTRAVENOUS | Status: DC | PRN
  Administered 2018-09-19: 250 mg via INTRAVENOUS

## 2018-09-19 MED ORDER — ENOXAPARIN SODIUM 40 MG/0.4ML ~~LOC~~ SOLN
40.0000 mg | SUBCUTANEOUS | Status: DC
  Administered 2018-09-20: 40 mg via SUBCUTANEOUS
  Filled 2018-09-19: qty 0.4

## 2018-09-19 MED ORDER — SODIUM CHLORIDE 0.9 % IV SOLN
INTRAVENOUS | Status: DC | PRN
  Administered 2018-09-19: 17:00:00 40 ug/min via INTRAVENOUS

## 2018-09-19 MED ORDER — DIPHENHYDRAMINE HCL 25 MG PO CAPS: 25.0000 mg | ORAL_CAPSULE | Freq: Four times a day (QID) | ORAL | Status: DC | PRN

## 2018-09-19 MED ORDER — ESMOLOL HCL 100 MG/10ML IV SOLN
INTRAVENOUS | Status: DC | PRN
  Administered 2018-09-19 (×2): 20 mg via INTRAVENOUS

## 2018-09-19 MED ORDER — PROPOFOL 10 MG/ML IV BOLUS
INTRAVENOUS | Status: DC | PRN
  Administered 2018-09-19: 200 mg via INTRAVENOUS

## 2018-09-19 MED ORDER — MORPHINE SULFATE (PF) 4 MG/ML IV SOLN
4.0000 mg | Freq: Once | INTRAVENOUS | Status: AC
  Administered 2018-09-19: 4 mg via INTRAVENOUS
  Filled 2018-09-19: qty 1

## 2018-09-19 MED ORDER — SODIUM CHLORIDE 0.9 % IV BOLUS
500.0000 mL | Freq: Once | INTRAVENOUS | Status: AC
  Administered 2018-09-19: 500 mL via INTRAVENOUS

## 2018-09-19 MED ORDER — DEXAMETHASONE SODIUM PHOSPHATE 10 MG/ML IJ SOLN
INTRAMUSCULAR | Status: AC
  Filled 2018-09-19: qty 1

## 2018-09-19 MED ORDER — HYDROMORPHONE HCL 1 MG/ML IJ SOLN
1.0000 mg | INTRAMUSCULAR | Status: DC | PRN
  Administered 2018-09-19: 1 mg via INTRAVENOUS

## 2018-09-19 MED ORDER — LABETALOL HCL 5 MG/ML IV SOLN
5.0000 mg | INTRAVENOUS | Status: DC | PRN
  Administered 2018-09-19 (×2): 5 mg via INTRAVENOUS

## 2018-09-19 MED ORDER — BUPIVACAINE-EPINEPHRINE (PF) 0.25% -1:200000 IJ SOLN
INTRAMUSCULAR | Status: AC
  Filled 2018-09-19: qty 30

## 2018-09-19 MED ORDER — OXYCODONE HCL 5 MG/5ML PO SOLN: 5.0000 mg | Freq: Once | ORAL | Status: AC | PRN

## 2018-09-19 MED ORDER — CELECOXIB 200 MG PO CAPS: 200.0000 mg | ORAL_CAPSULE | ORAL | Status: DC

## 2018-09-19 MED ORDER — MIDAZOLAM HCL 5 MG/5ML IJ SOLN
INTRAMUSCULAR | Status: DC | PRN
  Administered 2018-09-19: 2 mg via INTRAVENOUS

## 2018-09-19 MED ORDER — ONDANSETRON HCL 4 MG/2ML IJ SOLN
INTRAMUSCULAR | Status: DC | PRN
  Administered 2018-09-19: 4 mg via INTRAVENOUS

## 2018-09-19 MED ORDER — FENTANYL CITRATE (PF) 250 MCG/5ML IJ SOLN
INTRAMUSCULAR | Status: DC | PRN
  Administered 2018-09-19: 25 ug via INTRAVENOUS
  Administered 2018-09-19: 50 ug via INTRAVENOUS

## 2018-09-19 MED ORDER — ROCURONIUM BROMIDE 10 MG/ML (PF) SYRINGE
PREFILLED_SYRINGE | INTRAVENOUS | Status: AC
  Filled 2018-09-19: qty 10

## 2018-09-19 MED ORDER — OXYCODONE HCL 5 MG PO TABS
5.0000 mg | ORAL_TABLET | Freq: Once | ORAL | Status: AC | PRN
  Administered 2018-09-19: 5 mg via ORAL

## 2018-09-19 MED ORDER — ONDANSETRON HCL 4 MG/2ML IJ SOLN: 4.0000 mg | Freq: Four times a day (QID) | INTRAMUSCULAR | Status: DC | PRN

## 2018-09-19 MED ORDER — FENTANYL CITRATE (PF) 100 MCG/2ML IJ SOLN
25.0000 ug | INTRAMUSCULAR | Status: DC | PRN
  Administered 2018-09-19: 50 ug via INTRAVENOUS

## 2018-09-19 MED ORDER — ACETAMINOPHEN 650 MG RE SUPP: 650.0000 mg | Freq: Four times a day (QID) | RECTAL | Status: DC | PRN

## 2018-09-19 MED ORDER — BISACODYL 10 MG RE SUPP: 10.0000 mg | Freq: Every day | RECTAL | Status: DC | PRN

## 2018-09-19 MED ORDER — BISACODYL 10 MG RE SUPP
10.0000 mg | Freq: Once | RECTAL | Status: AC
  Administered 2018-09-20: 10 mg via RECTAL
  Filled 2018-09-19: qty 1

## 2018-09-19 MED ORDER — LACTATED RINGERS IV SOLN
INTRAVENOUS | Status: DC | PRN
  Administered 2018-09-19: 16:00:00 via INTRAVENOUS

## 2018-09-19 MED ORDER — LIDOCAINE 2% (20 MG/ML) 5 ML SYRINGE
INTRAMUSCULAR | Status: DC | PRN
  Administered 2018-09-19: 100 mg via INTRAVENOUS

## 2018-09-19 MED ORDER — HYDROMORPHONE HCL 1 MG/ML IJ SOLN
0.5000 mg | INTRAMUSCULAR | Status: DC | PRN
  Administered 2018-09-19 – 2018-09-20 (×5): 1 mg via INTRAVENOUS
  Filled 2018-09-19 (×6): qty 1

## 2018-09-19 MED ORDER — ROCURONIUM BROMIDE 10 MG/ML (PF) SYRINGE
PREFILLED_SYRINGE | INTRAVENOUS | Status: DC | PRN
  Administered 2018-09-19: 40 mg via INTRAVENOUS

## 2018-09-19 MED ORDER — HYDROMORPHONE HCL 1 MG/ML IJ SOLN
INTRAMUSCULAR | Status: AC
  Filled 2018-09-19: qty 1

## 2018-09-19 MED ORDER — DIPHENHYDRAMINE HCL 50 MG/ML IJ SOLN: 25.0000 mg | Freq: Four times a day (QID) | INTRAMUSCULAR | Status: DC | PRN

## 2018-09-19 MED ORDER — LIDOCAINE 2% (20 MG/ML) 5 ML SYRINGE
INTRAMUSCULAR | Status: AC
  Filled 2018-09-19: qty 5

## 2018-09-19 MED ORDER — INSULIN ASPART 100 UNIT/ML ~~LOC~~ SOLN
SUBCUTANEOUS | Status: AC
  Filled 2018-09-19: qty 1

## 2018-09-19 MED ORDER — MIDAZOLAM HCL 2 MG/2ML IJ SOLN
INTRAMUSCULAR | Status: AC
  Filled 2018-09-19: qty 2

## 2018-09-19 SURGICAL SUPPLY — 44 items
ADH SKN CLS APL DERMABOND .7 (GAUZE/BANDAGES/DRESSINGS) ×1
APL PRP STRL LF DISP 70% ISPRP (MISCELLANEOUS) ×1
APPLIER CLIP 5 13 M/L LIGAMAX5 (MISCELLANEOUS)
APR CLP MED LRG 5 ANG JAW (MISCELLANEOUS)
BAG SPEC RTRVL LRG 6X4 10 (ENDOMECHANICALS) ×1
BLADE CLIPPER SURG (BLADE) IMPLANT
CANISTER SUCT 3000ML PPV (MISCELLANEOUS) ×2 IMPLANT
CHLORAPREP W/TINT 26 (MISCELLANEOUS) ×2 IMPLANT
CLIP APPLIE 5 13 M/L LIGAMAX5 (MISCELLANEOUS) IMPLANT
COVER SURGICAL LIGHT HANDLE (MISCELLANEOUS) ×2 IMPLANT
COVER WAND RF STERILE (DRAPES) ×2 IMPLANT
CUTTER FLEX LINEAR 45M (STAPLE) ×2 IMPLANT
DERMABOND ADVANCED (GAUZE/BANDAGES/DRESSINGS) ×1
DERMABOND ADVANCED .7 DNX12 (GAUZE/BANDAGES/DRESSINGS) ×1 IMPLANT
DEVICE PMI PUNCTURE CLOSURE (MISCELLANEOUS) ×2 IMPLANT
ELECT REM PT RETURN 9FT ADLT (ELECTROSURGICAL) ×2
ELECTRODE REM PT RTRN 9FT ADLT (ELECTROSURGICAL) ×1 IMPLANT
GLOVE BIO SURGEON STRL SZ 6 (GLOVE) ×2 IMPLANT
GLOVE INDICATOR 6.5 STRL GRN (GLOVE) ×2 IMPLANT
GOWN STRL REUS W/ TWL LRG LVL3 (GOWN DISPOSABLE) ×3 IMPLANT
GOWN STRL REUS W/TWL LRG LVL3 (GOWN DISPOSABLE) ×6
KIT BASIN OR (CUSTOM PROCEDURE TRAY) ×2 IMPLANT
KIT TURNOVER KIT B (KITS) ×2 IMPLANT
NDL INSUFFLATION 14GA 120MM (NEEDLE) ×1 IMPLANT
NEEDLE INSUFFLATION 14GA 120MM (NEEDLE) ×2 IMPLANT
NS IRRIG 1000ML POUR BTL (IV SOLUTION) ×2 IMPLANT
PAD ARMBOARD 7.5X6 YLW CONV (MISCELLANEOUS) ×4 IMPLANT
POUCH SPECIMEN RETRIEVAL 10MM (ENDOMECHANICALS) ×2 IMPLANT
RELOAD 45 VASCULAR/THIN (ENDOMECHANICALS) IMPLANT
RELOAD STAPLE TA45 3.5 REG BLU (ENDOMECHANICALS) ×2 IMPLANT
SCISSORS ENDO CVD 5DCS (MISCELLANEOUS) IMPLANT
SET IRRIG TUBING LAPAROSCOPIC (IRRIGATION / IRRIGATOR) ×2 IMPLANT
SET TUBE SMOKE EVAC HIGH FLOW (TUBING) ×2 IMPLANT
SHEARS HARMONIC ACE PLUS 36CM (ENDOMECHANICALS) IMPLANT
SLEEVE ENDOPATH XCEL 5M (ENDOMECHANICALS) ×3 IMPLANT
SPECIMEN JAR SMALL (MISCELLANEOUS) ×2 IMPLANT
SUT MNCRL AB 4-0 PS2 18 (SUTURE) ×2 IMPLANT
TOWEL GREEN STERILE FF (TOWEL DISPOSABLE) ×2 IMPLANT
TRAY FOLEY W/BAG SLVR 16FR (SET/KITS/TRAYS/PACK) ×2
TRAY FOLEY W/BAG SLVR 16FR ST (SET/KITS/TRAYS/PACK) ×1 IMPLANT
TRAY LAPAROSCOPIC MC (CUSTOM PROCEDURE TRAY) ×2 IMPLANT
TROCAR XCEL 12X100 BLDLESS (ENDOMECHANICALS) ×2 IMPLANT
TROCAR XCEL NON-BLD 5MMX100MML (ENDOMECHANICALS) ×2 IMPLANT
WATER STERILE IRR 1000ML POUR (IV SOLUTION) ×2 IMPLANT

## 2018-09-19 NOTE — Anesthesia Procedure Notes (Signed)
Procedure Name: Intubation Performed by: Milford Cage, CRNA Pre-anesthesia Checklist: Patient identified, Emergency Drugs available, Suction available and Patient being monitored Patient Re-evaluated:Patient Re-evaluated prior to induction Oxygen Delivery Method: Circle System Utilized Preoxygenation: Pre-oxygenation with 100% oxygen Induction Type: IV induction and Rapid sequence Grade View: Grade I Tube type: Oral Tube size: 7.0 mm Number of attempts: 1 Airway Equipment and Method: Stylet Placement Confirmation: ETT inserted through vocal cords under direct vision,  positive ETCO2 and breath sounds checked- equal and bilateral Secured at: 21 cm Tube secured with: Tape Dental Injury: Teeth and Oropharynx as per pre-operative assessment

## 2018-09-19 NOTE — ED Notes (Signed)
Pt. Called out asking for pain meds.

## 2018-09-19 NOTE — Transfer of Care (Signed)
Immediate Anesthesia Transfer of Care Note  Patient: [name]  Procedure(s) Performed: APPENDECTOMY LAPAROSCOPIC (N/A Abdomen)  Patient Location: PACU  Anesthesia Type:General  Level of Consciousness: awake  Airway & Oxygen Therapy: Patient Spontanous Breathing  Post-op Assessment: Report given to RN and Post -op Vital signs reviewed and stable  Post vital signs: Reviewed and stable  Last Vitals:  Vitals Value Taken Time  BP 176/92 09/19/18 1758  Temp    Pulse 102 09/19/18 1759  Resp 17 09/19/18 1759  SpO2 94 % 09/19/18 1759  Vitals shown include unvalidated device data.  Last Pain:  Vitals:   09/19/18 1452  TempSrc: Oral  PainSc:          Complications: No apparent anesthesia complications
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
## 2018-09-19 NOTE — Op Note (Signed)
Operative Report  [name] 58 y.o. female  [id number]  [id number]  09/19/2018  Surgeon: Clovis Riley   Assistant: none  Procedure performed: Laparoscopic Appendectomy  Preop diagnosis: Acute appendicitis  Post-op diagnosis/intraop findings: Acute appendicitis  Specimens: appendix  EBL: minimal  Complications: none  Description of procedure: After obtaining informed consent the patient was brought to the operating room. Antibiotics were administered. SCD's were applied. General endotracheal anesthesia was initiated and a formal time-out was performed.  Foley catheter inserted which is removed at the end of the case.  The abdomen was prepped and draped in the usual sterile fashion and the abdomen was entered using an optical entry in the left upper quadrant and insufflated to 15 mmHg. The abdomen was inspected and there is no evidence of injury from our entry.  There was no purulence but there was a scant amount of bile tinged fluid in the right upper quadrant and throughout the abdomen.  On review of prior op note, some bile was spilled during her lap chole a week ago.  Under direct visualization a supraumbilical, left lower quadrant and suprapubic trocar were inserted.  The patient was placed in Trendelenburg and rotated to the left.  The omentum and terminal ileum were reflected cephalad and the appendix was identified.  The mesoappendix was divided with the harmonic scalpel and then the appendix was transected from the cecum using a blue load linear cutting stapler, taking a small cuff of healthy cecum along with the specimen.  The appendix was removed through the 12 mm trocar.  The line was inspected and appeared hemostatic and healthy.  I proceeded to survey the abdomen.  The pelvis was inspected.  She has a redundant sigmoid and rectum filled with stool, but no inflammation or evidence of perforation or infection.  Scant bile tinged fluid in the pelvis was aspirated.   The small bowel was run from the terminal ileum proximally several feet and there is no evidence of abnormality here.  The stomach was inspected and free of any injury.  I was unable to see the complete duodenum due to her preponderance of visceral fat but the proximal duodenum has no evidence of injury or perforation.  There is omentum adherent to the gallbladder fossa.  Some of this was peeled away, there is a small amount of thin old blood and bile tinged fluid and a small amount of bile tinged fluid about the liver.  This was all aspirated.  Given that her surgery was a week ago, this is not enough fluid to be consistent with a bile leak.  Ultimately there was no other source of her pain or fever identified.  The appendix was moderately dilated at the distal half.  At this point the 12 mm trocar site was closed with 0 Vicryl in the fascia using a laparoscopic suture passer.  The abdomen was desufflated and all trochars removed.  Skin incisions were closed with subcuticular Monocryl and Dermabond.  The patient was then awakened and taken to recovery in stable condition.  All counts were correct at the completion of the case.

## 2018-09-19 NOTE — Anesthesia Preprocedure Evaluation (Signed)
Anesthesia Evaluation  Patient identified by MRN, date of birth, ID band Patient awake    Reviewed: Allergy & Precautions, NPO status , Patient's Chart, lab work & pertinent test results, reviewed documented beta blocker date and time   History of Anesthesia Complications Negative for: history of anesthetic complications  Airway Mallampati: II  TM Distance: >3 FB Neck ROM: Full    Dental  (+) Dental Advisory Given   Pulmonary sleep apnea ,    breath sounds clear to auscultation       Cardiovascular hypertension, Pt. on home beta blockers and Pt. on medications  Rhythm:Regular Rate:Tachycardia     Neuro/Psych PSYCHIATRIC DISORDERS Anxiety Depression negative neurological ROS     GI/Hepatic GERD  Controlled,(+)     substance abuse  ,   Endo/Other  diabetes, Poorly Controlled, Type 2, Insulin DependentMorbid obesity  Renal/GU Renal InsufficiencyRenal disease     Musculoskeletal  (+) Arthritis , narcotic dependent  Abdominal (+) + obese,   Peds  Hematology  (+) anemia ,  Leukocytosis Thrombocytosis    Anesthesia Other Findings Chronic pain on narcotics   Reproductive/Obstetrics                             Anesthesia Physical Anesthesia Plan  ASA: III and emergent  Anesthesia Plan: General   Post-op Pain Management:    Induction: Intravenous and Rapid sequence  PONV Risk Score and Plan: 4 or greater and Treatment may vary due to age or medical condition, Ondansetron, Midazolam and Dexamethasone  Airway Management Planned: Oral ETT  Additional Equipment: None  Intra-op Plan:   Post-operative Plan: Extubation in OR  Informed Consent: I have reviewed the patients History and Physical, chart, labs and discussed the procedure including the risks, benefits and alternatives for the proposed anesthesia with the patient or authorized representative who has indicated his/her  understanding and acceptance.     Dental advisory given  Plan Discussed with: CRNA and Anesthesiologist  Anesthesia Plan Comments:         Anesthesia Quick Evaluation

## 2018-09-19 NOTE — Discharge Instructions (Addendum)
[facility]   LAPAROSCOPIC SURGERY: POST OP INSTRUCTIONS Always review your discharge instruction sheet given to you by the facility where your surgery was performed. IF YOU HAVE DISABILITY OR FAMILY LEAVE FORMS, YOU MUST BRING THEM TO THE OFFICE FOR PROCESSING.   DO NOT GIVE THEM TO YOUR DOCTOR.  PAIN CONTROL  1. First take acetaminophen (Tylenol) AND/or ibuprofen (Advil) to control your pain after surgery.  Follow directions on package.  Taking acetaminophen (Tylenol) and/or ibuprofen (Advil) regularly after surgery will help to control your pain and lower the amount of prescription pain medication you may need.  You should not take more than 4,000 mg (4 grams) of acetaminophen (Tylenol) in 24 hours.  You should not take ibuprofen (Advil), aleve, motrin, naprosyn or other NSAIDS if you have a history of stomach ulcers or chronic kidney disease.  2. A prescription for pain medication may be given to you upon discharge.  Take your pain medication as prescribed, if you still have uncontrolled pain after taking acetaminophen (Tylenol) or ibuprofen (Advil). 3. Use ice packs to help control pain. 4. If you need a refill on your pain medication, please contact your pharmacy.  They will contact our office to request authorization. Prescriptions will not be filled after 5pm or on week-ends.  HOME MEDICATIONS 5. Take your usually prescribed medications unless otherwise directed.  DIET 6. You should follow a light diet the first few days after arrival home.  Be sure to include lots of fluids daily. Avoid fatty, fried foods.   CONSTIPATION 7. It is common to experience some constipation after surgery and if you are taking pain medication.  Increasing fluid intake and taking a stool softener (such as Colace) will usually help or prevent this problem from occurring.  A mild laxative (Milk of Magnesia or Miralax) should be taken according to package instructions if there are no bowel  movements after 48 hours.  WOUND/INCISION CARE 8. Most patients will experience some swelling and bruising in the area of the incisions.  Ice packs will help.  Swelling and bruising can take several days to resolve.  9. Unless discharge instructions indicate otherwise, follow guidelines below  a. STERI-STRIPS - you may remove your outer bandages 48 hours after surgery, and you may shower at that time.  You have steri-strips (small skin tapes) in place directly over the incision.  These strips should be left on the skin for 7-10 days.   b. DERMABOND/SKIN GLUE - you may shower in 24 hours.  The glue will flake off over the next 2-3 weeks. 10. Any sutures or staples will be removed at the office during your follow-up visit.  ACTIVITIES 11. You may resume regular (light) daily activities beginning the next day--such as daily self-care, walking, climbing stairs--gradually increasing activities as tolerated.  You may have sexual intercourse when it is comfortable.  Refrain from any heavy lifting or straining until approved by your doctor. a. You may drive when you are no longer taking prescription pain medication, you can comfortably wear a seatbelt, and you can safely maneuver your car and apply brakes.  FOLLOW-UP 12. You should see your doctor in the office for a follow-up appointment approximately 2-3 weeks after your surgery.  You should have been given your post-op/follow-up appointment when your surgery was scheduled.  If you did not receive a post-op/follow-up appointment, make sure that you call for this appointment within a day or two after you arrive home to insure a convenient appointment time.  OTHER INSTRUCTIONS  WHEN TO CALL YOUR DOCTOR: 1. Fever over 101.0 2. Inability to urinate 3. Continued bleeding from incision. 4. Increased pain, redness, or drainage from the incision. 5. Increasing abdominal pain  The clinic staff is available to answer your questions during regular business  hours.  Please dont hesitate to call and ask to speak to one of the nurses for clinical concerns.  If you have a medical emergency, go to the nearest emergency room or call 911.  A surgeon from [facility] is always on call at the hospital. [address] [place], [place], [place]  [address] ? [address] [place], [place],    [phone] ? [phone] ? FAX [phone]   Diabetes Mellitus and Nutrition, Adult When you have diabetes (diabetes mellitus), it is very important to have healthy eating habits because your blood sugar (glucose) levels are greatly affected by what you eat and drink. Eating healthy foods in the appropriate amounts, at about the same times every day, can help you:  Control your blood glucose.  Lower your risk of heart disease.  Improve your blood pressure.  Reach or maintain a healthy weight. Every person with diabetes is different, and each person has different needs for a meal plan. Your health care provider may recommend that you work with a diet and nutrition specialist (dietitian) to make a meal plan that is best for you. Your meal plan may vary depending on factors such as:  The calories you need.  The medicines you take.  Your weight.  Your blood glucose, blood pressure, and cholesterol levels.  Your activity level.  Other health conditions you have, such as heart or kidney disease. How do carbohydrates affect me? Carbohydrates, also called carbs, affect your blood glucose level more than any other type of food. Eating carbs naturally raises the amount of glucose in your blood. Carb counting is a method for keeping track of how many carbs you eat. Counting carbs is important to keep your blood glucose at a healthy level, especially if you use insulin or take certain oral diabetes medicines. It is important to know how many carbs you can safely have in each meal. This is different for every person. Your dietitian can help  you calculate how many carbs you should have at each meal and for each snack. Foods that contain carbs include:  Bread, cereal, rice, pasta, and crackers.  Potatoes and corn.  Peas, beans, and lentils.  Milk and yogurt.  Fruit and juice.  Desserts, such as cakes, cookies, ice cream, and candy. How does alcohol affect me? Alcohol can cause a sudden decrease in blood glucose (hypoglycemia), especially if you use insulin or take certain oral diabetes medicines. Hypoglycemia can be a life-threatening condition. Symptoms of hypoglycemia (sleepiness, dizziness, and confusion) are similar to symptoms of having too much alcohol. If your health care provider says that alcohol is safe for you, follow these guidelines:  Limit alcohol intake to no more than 1 drink per day for nonpregnant women and 2 drinks per day for men. One drink equals 12 oz of beer, 5 oz of wine, or 1 oz of hard liquor.  Do not drink on an empty stomach.  Keep yourself hydrated with water, diet soda, or unsweetened iced tea.  Keep in mind that regular soda, juice, and other mixers may contain a lot of sugar and must be counted as carbs. What are tips for following this plan?  Reading food labels  Start  by checking the serving size on the "Nutrition Facts" label of packaged foods and drinks. The amount of calories, carbs, fats, and other nutrients listed on the label is based on one serving of the item. Many items contain more than one serving per package.  Check the total grams (g) of carbs in one serving. You can calculate the number of servings of carbs in one serving by dividing the total carbs by 15. For example, if a food has 30 g of total carbs, it would be equal to 2 servings of carbs.  Check the number of grams (g) of saturated and trans fats in one serving. Choose foods that have low or no amount of these fats.  Check the number of milligrams (mg) of salt (sodium) in one serving. Most people should limit total  sodium intake to less than 2,300 mg per day.  Always check the nutrition information of foods labeled as "low-fat" or "nonfat". These foods may be higher in added sugar or refined carbs and should be avoided.  Talk to your dietitian to identify your daily goals for nutrients listed on the label. Shopping  Avoid buying canned, premade, or processed foods. These foods tend to be high in fat, sodium, and added sugar.  Shop around the outside edge of the grocery store. This includes fresh fruits and vegetables, bulk grains, fresh meats, and fresh dairy. Cooking  Use low-heat cooking methods, such as baking, instead of high-heat cooking methods like deep frying.  Cook using healthy oils, such as olive, canola, or sunflower oil.  Avoid cooking with butter, cream, or high-fat meats. Meal planning  Eat meals and snacks regularly, preferably at the same times every day. Avoid going long periods of time without eating.  Eat foods high in fiber, such as fresh fruits, vegetables, beans, and whole grains. Talk to your dietitian about how many servings of carbs you can eat at each meal.  Eat 4-6 ounces (oz) of lean protein each day, such as lean meat, chicken, fish, eggs, or tofu. One oz of lean protein is equal to: ? 1 oz of meat, chicken, or fish. ? 1 egg. ?  cup of tofu.  Eat some foods each day that contain healthy fats, such as avocado, nuts, seeds, and fish. Lifestyle  Check your blood glucose regularly.  Exercise regularly as told by your health care provider. This may include: ? 150 minutes of moderate-intensity or vigorous-intensity exercise each week. This could be brisk walking, biking, or water aerobics. ? Stretching and doing strength exercises, such as yoga or weightlifting, at least 2 times a week.  Take medicines as told by your health care provider.  Do not use any products that contain nicotine or tobacco, such as cigarettes and e-cigarettes. If you need help quitting, ask  your health care provider.  Work with a Social worker or diabetes educator to identify strategies to manage stress and any emotional and social challenges. Questions to ask a health care provider  Do I need to meet with a diabetes educator?  Do I need to meet with a dietitian?  What number can I call if I have questions?  When are the best times to check my blood glucose? Where to find more information:  American Diabetes Association: diabetes.org  Academy of Nutrition and Dietetics: www.eatright.[employer] of Diabetes and Digestive and Kidney Diseases (NIH): [employer] Summary  A healthy meal plan will help you control your blood glucose and maintain a healthy lifestyle.  Working with a  diet and nutrition specialist (dietitian) can help you make a meal plan that is best for you.  Keep in mind that carbohydrates (carbs) and alcohol have immediate effects on your blood glucose levels. It is important to count carbs and to use alcohol carefully. This information is not intended to replace advice given to you by your health care provider. Make sure you discuss any questions you have with your health care provider. Document Released: 11/23/2004 Document Revised: 02/08/2017 Document Reviewed: 04/02/2016 Elsevier Patient Education  2020 [employer].

## 2018-09-19 NOTE — H&P (Addendum)
[name]  [name] Aug 05, 1960  [id number].    Requesting MD: Marda Stalker Chief Complaint/Reason for Consult: acute appendicitis  HPI:  [name] is a 58yo female PMH HTN, DM, h/o SVT 3 years ago, who just underwent laparoscopic cholecystectomy on 7/3 by Dr. Barry Dienes for acute cholecystitis, who presented to [facility] today complaining of acute onset abdominal pain. States that the pain woke her up from sleep around 0300. Pain is RLQ. Constant and severe. Worse with movement. Denies nausea, vomiting, diarrhea, fever, chills, dysuria. States that she has not had a BM since 7/2. CT scan shows 8-31m appendix without perforation or abscess. WBC 18.9, Cr 1.28, alk phos mildly elevated at 140 otherwise LFTs WNL.  She has had nothing to eat/drink today  Nonsmoker Employment: teacher  ROS: Review of Systems  Constitutional: Negative.   HENT: Negative.   Eyes: Negative.   Respiratory: Negative.   Cardiovascular: Negative.   Gastrointestinal: Positive for abdominal pain and constipation. Negative for diarrhea, nausea and vomiting.  Genitourinary: Negative.   Musculoskeletal: Negative.   Skin: Negative.   Neurological: Negative.    All systems reviewed and otherwise negative except for as above  Family History  Problem Relation Age of Onset  . Hypertension Mother   . Alzheimer's disease Mother   . Diabetes Father   . Breast cancer Sister 429 . Anesthesia problems Neg Hx   . Hypotension Neg Hx   . Malignant hyperthermia Neg Hx   . Pseudochol deficiency Neg Hx   . Colon cancer Neg Hx   . Esophageal cancer Neg Hx   . Stomach cancer Neg Hx   . Rectal cancer Neg Hx   . Thyroid disease Neg Hx     Past Medical History:  Diagnosis Date  . Abscess    increased drainage from abscess on buttock  . Anal fistula   . Anxiety   . Bilateral hip pain 05/27/2015  . Chronic pain syndrome 05/27/2015  . Depression    sees Dr. RBarrie Folk .  Di[facility] mellitus without complication ([facility]   . Hypertension   . Sleep apnea    2008- sleep study, neg. for sleep apnea   . SVT (supraventricular tachycardia) (HCC)     Past Surgical History:  Procedure Laterality Date  . ANAL EXAMINATION UNDER ANESTHESIA  02/21/11   anal fistula  . BREAST SURGERY  patient does not remember date of procedure   pull fluid off lft br  . CHOLECYSTECTOMY N/A 09/12/2018   Procedure: LAPAROSCOPIC CHOLECYSTECTOMY;  Surgeon: BStark Klein MD;  [facility]  Service: General;  Laterality: N/A;  . ELECTROPHYSIOLOGIC STUDY N/A 05/05/2015   Procedure: SVT Ablation;  Surgeon: Will MMeredith Leeds MD;  [facility];  Service: Cardiovascular;  Laterality: N/A;  . EP IMPLANTABLE DEVICE N/A 01/30/2016   Procedure: Loop Recorder Insertion;  Surgeon: MSanda Klein MD;  [facility];  Service: Cardiovascular;  Laterality: N/A;  . INCISE AND DRAIN ABCESS     abscess on right thigh and buttock  . KNEE ARTHROSCOPY     left  . SHOULDER SURGERY  04/14/09   right    Social History:  reports that she has never smoked. She has never used smokeless tobacco. She reports that she does not drink alcohol or use drugs.  Allergies:  Allergies  Allergen Reactions  . Tramadol Nausea Only    (Not in a hospital admission)   Prior to Admission medications   Medication Sig  Start Date End Date Taking? Authorizing Provider  atorvastatin (LIPITOR) 20 MG tablet TAKE 1 TABLET BY MOUTH AT BEDTIME Patient taking differently: Take 20 mg by mouth at bedtime.  07/24/18   Croitoru, Mihai, MD  carvedilol (COREG) 3.125 MG tablet Take 1 tablet (3.125 mg total) by mouth 2 (two) times daily. Patient taking differently: Take 3.125 mg by mouth daily.  08/13/18 11/11/18  Croitoru, Mihai, MD  dexlansoprazole (DEXILANT) 60 MG capsule Take 1 capsule (60 mg total) by mouth daily. 05/07/18   Tysinger, Camelia Eng, PA-C  diclofenac (VOLTAREN) 50 MG EC tablet Take 1 tablet by  mouth twice daily Patient taking differently: Take 50 mg by mouth 2 (two) times daily.  09/06/18   Meredith Staggers, MD  DULoxetine (CYMBALTA) 60 MG capsule Take 1 capsule by mouth twice daily 09/02/18   Tysinger, Camelia Eng, PA-C  furosemide (LASIX) 80 MG tablet Take 1 tablet by mouth once daily 09/02/18   Tysinger, Camelia Eng, PA-C  HYDROcodone-acetaminophen [facility]) 10-325 MG tablet Take 1-2 tablets by mouth every 6 (six) hours as needed for moderate pain. 09/12/18   Stark Klein, MD  Insulin Glargine (BASAGLAR KWIKPEN) 100 UNIT/ML SOPN Inject 1.4 mLs (140 Units total) into the skin every morning. And pen needles 1/day 05/28/18   Renato Shin, MD  LORazepam (ATIVAN) 0.5 MG tablet Take 1 tablet (0.5 mg total) by mouth 2 (two) times daily as needed. for anxiety 09/03/18   Tysinger, Camelia Eng, PA-C  nortriptyline (PAMELOR) 25 MG capsule Take 1 capsule (25 mg total) by mouth at bedtime. 06/04/18   Meredith Staggers, MD  oxyCODONE (OXY IR/ROXICODONE) 5 MG immediate release tablet Take 1-2 tablets (5-10 mg total) by mouth every 4 (four) hours as needed for moderate pain. 09/13/18   Johnathan Hausen, MD    Blood pressure (!) 162/83, pulse 89, temperature 99.1 F (37.3 C), temperature source Oral, resp. rate 20, height _0  (1.6 m), weight 113.9 kg, last menstrual period 02/09/2017, SpO2 93 %. Physical Exam: General: pleasant, WD/WN female who is laying in bed in NAD HEENT: head is normocephalic, atraumatic.  Sclera are noninjected.  Pupils equal and round.  Ears and nose without any masses or lesions.  Mouth is pink and moist. Dentition fair Heart: regular, rate, and rhythm.  No obvious murmurs, gallops, or rubs noted.  Palpable pedal pulses bilaterally Lungs: CTAB, no wheezes, rhonchi, or rales noted.  Respiratory effort nonlabored Abd: well healed lap incisions with dermabond in place/ no erythema or drainage, obese, soft, +BS, no masses, hernias, or organomegaly. TTP RLQ>RUQ with voluntary guarding, no  peritonitis MS: all 4 extremities are symmetrical with no cyanosis, clubbing, or edema. Skin: warm and dry with no masses, lesions, or rashes Psych: A&Ox3 with an appropriate affect. Neuro: cranial nerves grossly intact, extremity CSM intact bilaterally, normal speech  Results for orders placed or performed during the hospital encounter of 09/19/18 (from the past 48 hour(s))  Lipase, blood     Status: None   Collection Time: 09/19/18  6:13 AM  Result Value Ref Range   Lipase 42 11 - 51 U/L    Comment: Performed at [facility], [address] [place], [place] [address]  Comprehensive metabolic panel     Status: Abnormal   Collection Time: 09/19/18  6:13 AM  Result Value Ref Range   Sodium 137 135 - 145 mmol/L   Potassium 4.1 3.5 - 5.1 mmol/L   Chloride 101 98 - 111 mmol/L   CO2 25  22 - 32 mmol/L   Glucose, Bld 279 (H) 70 - 99 mg/dL   BUN 10 6 - 20 mg/dL   Creatinine, Ser 1.28 (H) 0.44 - 1.00 mg/dL   Calcium 8.9 8.9 - 10.3 mg/dL   Total Protein 6.5 6.5 - 8.1 g/dL   Albumin 2.8 (L) 3.5 - 5.0 g/dL   AST 22 15 - 41 U/L   ALT 34 0 - 44 U/L   Alkaline Phosphatase 140 (H) 38 - 126 U/L   Total Bilirubin 0.7 0.3 - 1.2 mg/dL   GFR calc non Af Amer 46 (L) >60 mL/min   GFR calc Af Amer 54 (L) >60 mL/min   Anion gap 11 5 - 15    Comment: Performed at [facility] [address], [place], [place] [address]  CBC     Status: Abnormal   Collection Time: 09/19/18  6:13 AM  Result Value Ref Range   WBC 18.9 (H) 4.0 - 10.5 K/uL   RBC 4.60 3.87 - 5.11 MIL/uL   Hemoglobin 10.5 (L) 12.0 - 15.0 g/dL   HCT 34.8 (L) 36.0 - 46.0 %   MCV 75.7 (L) 80.0 - 100.0 fL   MCH 22.8 (L) 26.0 - 34.0 pg   MCHC 30.2 30.0 - 36.0 g/dL   RDW 14.7 11.5 - 15.5 %   Platelets 519 (H) 150 - 400 K/uL   nRBC 0.0 0.0 - 0.2 %    Comment: Performed at [facility] [address], [place], [place] [address]   Ct Abdomen Pelvis W Contrast  Result Date: 09/19/2018 CLINICAL DATA:  Abdominal pain. RIGHT  lower quadrant pain radiating to RIGHT flank. Cholecystectomy on 09/12/2018. Appendicitis suspected. Postoperative complications suspected. EXAM: CT ABDOMEN AND PELVIS WITH CONTRAST TECHNIQUE: Multidetector CT imaging of the abdomen and pelvis was performed using the standard protocol following bolus administration of intravenous contrast. CONTRAST:  130m OMNIPAQUE IOHEXOL 300 MG/ML  SOLN COMPARISON:  CT abdomen dated 05/30/2016. FINDINGS: Lower chest: No acute abnormality. Hepatobiliary: No focal liver abnormality is seen. Status post cholecystectomy. Small amount of fluid within the gallbladder fossa is an expected finding status post recent cholecystectomy. No bile duct dilatation. Pancreas: Unremarkable. No pancreatic ductal dilatation or surrounding inflammatory changes. Spleen: Normal in size without focal abnormality. Adrenals/Urinary Tract: LEFT adrenal fullness is stable. RIGHT adrenal gland appears normal. Kidneys are unremarkable without suspicious mass, stone or hydronephrosis. No ureteral or bladder calculi identified. Bladder appears normal, partially decompressed. Stomach/Bowel: Appendix is slightly distended, measuring 8-9 mm diameter, with suspected wall thickening and amorphous margins, suspicious for early acute appendicitis (axial series 3, image 60; sagittal series 7, images 59 through 69; coronal series 6, images 59 through 68). Small amount of free fluid within the adjacent RIGHT lower quadrant. No appendicoliths seen. Appendix: Location: RIGHT lower quadrant Diameter: 8-9 mm Appendicolith: Not Seen Mucosal hyper-enhancement: Not seen Extraluminal gas: Not Seen Periappendiceal collection: No periappendiceal abscess. No dilated large or small bowel loops. Stomach is unremarkable, partially decompressed. Vascular/Lymphatic: Mild aortic atherosclerosis. No acute appearing vascular abnormality. No enlarged lymph nodes seen in the abdomen or pelvis. Reproductive: Leiomyomatous uterus. Adnexal  regions are unremarkable. Other: No abscess collection seen. No free intraperitoneal air. Musculoskeletal: No acute or suspicious osseous finding. IMPRESSION: 1. Findings suspicious for early acute appendicitis, based on appendiceal diameter of 8-9 mm, but not definitive without convincing periappendiceal inflammation. Of [name], the appendix had a normal caliber on CT of 05/30/2016 increasing my suspicion for early acute appendicitis. No evidence  for perforation or abscess formation. 2. Status post recent cholecystectomy. Small amount of fluid within the gallbladder fossa is an expected finding status post recent cholecystectomy. 3. Leiomyomatous uterus. Electronically Signed   By: Franki Cabot M.D.   On: 09/19/2018 08:14      Assessment/Plan HTN HLD DM - SSI H/o SVT 3 years ago Anxiety/depression S/p lap chole 7/3 Dr. Barry Dienes  RLQ pain Leukocytosis Early Appendicitis Constipation - last BM 7/2 - Patient with acute onset RLQ pain this AM. CT findings concerning for possible early appendicitis and she does have leukocytosis 18.9. Recommend laparoscopic appendectomy. Start IV rocephin/flagyl. Keep NPO. Covid test pending.  ID - rocephin/flagyl VTE - SCDs, lovenox FEN - IVF, NPO Foley - none Follow up - TBD   Wellington Hampshire, [facility] 09/19/2018, 9:08 AM Pager: [phone] Mon-Thurs 7:00 am-4:30 pm Fri 7:00 am -11:30 AM Sat-Sun 7:00 am-11:30 am

## 2018-09-19 NOTE — ED Notes (Signed)
Pt reports pain an 8 prior to meds.   IV patent and pt appears in NAD.

## 2018-09-19 NOTE — ED Triage Notes (Addendum)
Patient from home, RLQ pain that radiates to right flank.  Patient had a cholecystectomy on 09/12/2018.  Patient with increase abdominal pain since 4am which woke her from sleep.  No nausea, no vomiting. Diaphoretic.  Patient given 168mcg of fentanyl en route to ED.

## 2018-09-19 NOTE — ED Notes (Addendum)
ED TO INPATIENT HANDOFF REPORT  ED Nurse Name and Phone #: Thurmond Butts [place] Name/Age/Gender [name] 58 y.o. female Room/Bed: 038C/038C  Code Status   Code Status: Full Code  Home/SNF/Other Home Patient oriented to: self, place, time and situation Is this baseline? Yes   Triage Complete: Triage complete  Chief Complaint flank pain  Triage Note Patient from home, RLQ pain that radiates to right flank.  Patient had a cholecystectomy on 09/12/2018.  Patient with increase abdominal pain since 4am which woke her from sleep.  No nausea, no vomiting. Diaphoretic.  Patient given 160mcg of fentanyl en route to ED.     Allergies Allergies  Allergen Reactions  . Tramadol Nausea Only    Level of Care/Admitting Diagnosis ED Disposition    ED Disposition Condition [facility] Area: [place] [[id number]]  Level of Care: Med-Surg [16]  Covid Evaluation: N/A  Diagnosis: Acute appendicitis [867619]  Admitting Physician: CCS, [place]  Attending Physician: CCS, MD [[id number]]  Bed request comments: 6N  PT Class (Do Not Modify): Observation [104]  PT Acc Code (Do Not Modify): Observation [10022]       B Medical/Surgery History Past Medical History:  Diagnosis Date  . Abscess    increased drainage from abscess on buttock  . Anal fistula   . Anxiety   . Bilateral hip pain 05/27/2015  . Chronic pain syndrome 05/27/2015  . Depression    sees [name]  . Diabetes mellitus without complication ([place])   . Hypertension   . Sleep apnea    2008- sleep study, neg. for sleep apnea   . SVT (supraventricular tachycardia) (HCC)    Past Surgical History:  Procedure Laterality Date  . ANAL EXAMINATION UNDER ANESTHESIA  02/21/11   anal fistula  . BREAST SURGERY  patient does not remember date of procedure   pull fluid off lft br  . CHOLECYSTECTOMY N/A 09/12/2018   Procedure: LAPAROSCOPIC CHOLECYSTECTOMY;  Surgeon: Stark Klein, MD;  Location: [facility];   Service: General;  Laterality: N/A;  . ELECTROPHYSIOLOGIC STUDY N/A 05/05/2015   Procedure: SVT Ablation;  Surgeon: Will Meredith Leeds, MD;  Location: [facility];  Service: Cardiovascular;  Laterality: N/A;  . EP IMPLANTABLE DEVICE N/A 01/30/2016   Procedure: Loop Recorder Insertion;  Surgeon: Sanda Klein, MD;  Location: [facility];  Service: Cardiovascular;  Laterality: N/A;  . INCISE AND DRAIN ABCESS     abscess on right thigh and buttock  . KNEE ARTHROSCOPY     left  . SHOULDER SURGERY  04/14/09   right     A IV Location/Drains/Wounds Patient Lines/Drains/Airways Status   Active Line/Drains/Airways    Name:   Placement date:   Placement time:   Site:   Days:   Peripheral IV 09/19/18 Right Hand   09/19/18    0635    Hand   less than 1   Incision (Closed) 09/12/18 Abdomen Other (Comment)   09/12/18    0904     7   Incision - 5 Ports Abdomen 1: Right;Upper 2: Right;Mid 3: Mid;Upper 4: Left;Upper 5: Left;Mid   09/12/18    0902     7          Intake/Output Last 24 hours  Intake/Output Summary (Last 24 hours) at 09/19/2018 1130 Last data filed at 09/19/2018 1029 Gross per 24 hour  Intake 200 ml  Output -  Net 200 ml    Labs/Imaging Results  for orders placed or performed during the hospital encounter of 09/19/18 (from the past 48 hour(s))  Urinalysis, Routine w reflex microscopic     Status: Abnormal   Collection Time: 09/19/18  6:07 AM  Result Value Ref Range   Color, Urine YELLOW YELLOW   APPearance CLEAR CLEAR   Specific Gravity, Urine 1.031 (H) 1.005 - 1.030   pH 6.0 5.0 - 8.0   Glucose, UA >=500 (A) NEGATIVE mg/dL   Hgb urine dipstick NEGATIVE NEGATIVE   Bilirubin Urine NEGATIVE NEGATIVE   Ketones, ur NEGATIVE NEGATIVE mg/dL   Protein, ur NEGATIVE NEGATIVE mg/dL   Nitrite NEGATIVE NEGATIVE   Leukocytes,Ua TRACE (A) NEGATIVE   RBC / HPF 0-5 0 - 5 RBC/hpf   WBC, UA 0-5 0 - 5 WBC/hpf   Bacteria, UA RARE (A) NONE SEEN   Squamous Epithelial / LPF 0-5  0 - 5    Comment: Performed at [facility], [address] [place], [place] [address]  Lipase, blood     Status: None   Collection Time: 09/19/18  6:13 AM  Result Value Ref Range   Lipase 42 11 - 51 U/L    Comment: Performed at [facility] [address], [place], [place] [address]  Comprehensive metabolic panel     Status: Abnormal   Collection Time: 09/19/18  6:13 AM  Result Value Ref Range   Sodium 137 135 - 145 mmol/L   Potassium 4.1 3.5 - 5.1 mmol/L   Chloride 101 98 - 111 mmol/L   CO2 25 22 - 32 mmol/L   Glucose, Bld 279 (H) 70 - 99 mg/dL   BUN 10 6 - 20 mg/dL   Creatinine, Ser 1.28 (H) 0.44 - 1.00 mg/dL   Calcium 8.9 8.9 - 10.3 mg/dL   Total Protein 6.5 6.5 - 8.1 g/dL   Albumin 2.8 (L) 3.5 - 5.0 g/dL   AST 22 15 - 41 U/L   ALT 34 0 - 44 U/L   Alkaline Phosphatase 140 (H) 38 - 126 U/L   Total Bilirubin 0.7 0.3 - 1.2 mg/dL   GFR calc non Af Amer 46 (L) >60 mL/min   GFR calc Af Amer 54 (L) >60 mL/min   Anion gap 11 5 - 15    Comment: Performed at [facility] [address], [place], [place] [address]  CBC     Status: Abnormal   Collection Time: 09/19/18  6:13 AM  Result Value Ref Range   WBC 18.9 (H) 4.0 - 10.5 K/uL   RBC 4.60 3.87 - 5.11 MIL/uL   Hemoglobin 10.5 (L) 12.0 - 15.0 g/dL   HCT 34.8 (L) 36.0 - 46.0 %   MCV 75.7 (L) 80.0 - 100.0 fL   MCH 22.8 (L) 26.0 - 34.0 pg   MCHC 30.2 30.0 - 36.0 g/dL   RDW 14.7 11.5 - 15.5 %   Platelets 519 (H) 150 - 400 K/uL   nRBC 0.0 0.0 - 0.2 %    Comment: Performed at [facility] [address], [place], [place] [address]  SARS Coronavirus 2 (CEPHEID - Performed in [facility] hospital lab), Hosp Order     Status: None   Collection Time: 09/19/18  8:25 AM   Specimen: Nasopharyngeal Swab  Result Value Ref Range   SARS Coronavirus 2 NEGATIVE NEGATIVE    Comment: (NOTE) If result is NEGATIVE SARS-CoV-2 target nucleic acids are NOT DETECTED. The SARS-CoV-2 RNA is generally detectable in upper  and  lower  respiratory specimens during the acute phase of infection. The lowest  concentration of SARS-CoV-2 viral copies this assay can detect is 250  copies / mL. A negative result does not preclude SARS-CoV-2 infection  and should not be used as the sole basis for treatment or other  patient management decisions.  A negative result may occur with  improper specimen collection / handling, submission of specimen other  than nasopharyngeal swab, presence of viral mutation(s) within the  areas targeted by this assay, and inadequate number of viral copies  (<250 copies / mL). A negative result must be combined with clinical  observations, patient history, and epidemiological information. If result is POSITIVE SARS-CoV-2 target nucleic acids are DETECTED. The SARS-CoV-2 RNA is generally detectable in upper and lower  respiratory specimens dur ing the acute phase of infection.  Positive  results are indicative of active infection with SARS-CoV-2.  Clinical  correlation with patient history and other diagnostic information is  necessary to determine patient infection status.  Positive results do  not rule out bacterial infection or co-infection with other viruses. If result is PRESUMPTIVE POSTIVE SARS-CoV-2 nucleic acids MAY BE PRESENT.   A presumptive positive result was obtained on the submitted specimen  and confirmed on repeat testing.  While 2019 novel coronavirus  (SARS-CoV-2) nucleic acids may be present in the submitted sample  additional confirmatory testing may be necessary for epidemiological  and / or clinical management purposes  to differentiate between  SARS-CoV-2 and other Sarbecovirus currently known to infect humans.  If clinically indicated additional testing with an alternate test  methodology [phone]) is advised. The SARS-CoV-2 RNA is generally  detectable in upper and lower respiratory sp ecimens during the acute  phase of infection. The expected result is  Negative. Fact Sheet for Patients:  StrictlyIdeas.no Fact Sheet for Healthcare Providers: BankingDealers.co.za This test is not yet approved or cleared by the [place] FDA and has been authorized for detection and/or diagnosis of SARS-CoV-2 by FDA under an Emergency Use Authorization (EUA).  This EUA will remain in effect (meaning this test can be used) for the duration of the COVID-19 declaration under Section 564(b)(1) of the Act, 21 U.S.C. section 360bbb-3(b)(1), unless the authorization is terminated or revoked sooner. Performed at [facility], [place] [address], [place], [place] [address]    Ct Abdomen Pelvis W Contrast  Result Date: 09/19/2018 CLINICAL DATA:  Abdominal pain. RIGHT lower quadrant pain radiating to RIGHT flank. Cholecystectomy on 09/12/2018. Appendicitis suspected. Postoperative complications suspected. EXAM: CT ABDOMEN AND PELVIS WITH CONTRAST TECHNIQUE: Multidetector CT imaging of the abdomen and pelvis was performed using the standard protocol following bolus administration of intravenous contrast. CONTRAST:  181mL OMNIPAQUE IOHEXOL 300 MG/ML  SOLN COMPARISON:  CT abdomen dated 05/30/2016. FINDINGS: Lower chest: No acute abnormality. Hepatobiliary: No focal liver abnormality is seen. Status post cholecystectomy. Small amount of fluid within the gallbladder fossa is an expected finding status post recent cholecystectomy. No bile duct dilatation. Pancreas: Unremarkable. No pancreatic ductal dilatation or surrounding inflammatory changes. Spleen: Normal in size without focal abnormality. Adrenals/Urinary Tract: LEFT adrenal fullness is stable. RIGHT adrenal gland appears normal. Kidneys are unremarkable without suspicious mass, stone or hydronephrosis. No ureteral or bladder calculi identified. Bladder appears normal, partially decompressed. Stomach/Bowel: Appendix is slightly distended, measuring 8-9 mm diameter, with  suspected wall thickening and amorphous margins, suspicious for early acute appendicitis (axial series 3, image 60; sagittal series 7, images 59 through 69; coronal series 6, images  59 through 34). Small amount of free fluid within the adjacent RIGHT lower quadrant. No appendicoliths seen. Appendix: Location: RIGHT lower quadrant Diameter: 8-9 mm Appendicolith: Not Seen Mucosal hyper-enhancement: Not seen Extraluminal gas: Not Seen Periappendiceal collection: No periappendiceal abscess. No dilated large or small bowel loops. Stomach is unremarkable, partially decompressed. Vascular/Lymphatic: Mild aortic atherosclerosis. No acute appearing vascular abnormality. No enlarged lymph nodes seen in the abdomen or pelvis. Reproductive: Leiomyomatous uterus. Adnexal regions are unremarkable. Other: No abscess collection seen. No free intraperitoneal air. Musculoskeletal: No acute or suspicious osseous finding. IMPRESSION: 1. Findings suspicious for early acute appendicitis, based on appendiceal diameter of 8-9 mm, but not definitive without convincing periappendiceal inflammation. Of note, the appendix had a normal caliber on CT of 05/30/2016 increasing my suspicion for early acute appendicitis. No evidence for perforation or abscess formation. 2. Status post recent cholecystectomy. Small amount of fluid within the gallbladder fossa is an expected finding status post recent cholecystectomy. 3. Leiomyomatous uterus. Electronically Signed   By: Franki Cabot M.D.   On: 09/19/2018 08:14    Pending Labs Unresulted Labs (From admission, onward)    Start     Ordered   09/26/18 0500  Creatinine, serum  (enoxaparin (LOVENOX)    CrCl >/= 30 ml/min)  Weekly,   R    Comments: while on enoxaparin therapy    09/19/18 0922   09/19/18 0919  Urine Culture  Add-on,   AD     09/19/18 0918          Vitals/Pain Today's Vitals   09/19/18 0645 09/19/18 0700 09/19/18 0715 09/19/18 0930  BP: (!) 159/68 (!) 160/83 (!) 162/83    Pulse: 92 90 89   Resp:      Temp:      TempSrc:      SpO2: 91% 92% 93%   Weight:      Height:      PainSc:    10-Worst pain ever    Isolation Precautions No active isolations  Medications Medications  cefTRIAXone (ROCEPHIN) 2 g in sodium chloride 0.9 % 100 mL IVPB (0 g Intravenous Stopped 09/19/18 0957)    And  metroNIDAZOLE (FLAGYL) IVPB 500 mg (0 mg Intravenous Stopped 09/19/18 1029)  enoxaparin (LOVENOX) injection 40 mg (has no administration in time range)  dextrose 5 %-0.45 % sodium chloride infusion (has no administration in time range)  acetaminophen (TYLENOL) tablet 650 mg (has no administration in time range)    Or  acetaminophen (TYLENOL) suppository 650 mg (has no administration in time range)  oxyCODONE (Oxy IR/ROXICODONE) immediate release tablet 5-10 mg (has no administration in time range)  methocarbamol (ROBAXIN) tablet 500 mg (has no administration in time range)  diphenhydrAMINE (BENADRYL) capsule 25 mg (has no administration in time range)    Or  diphenhydrAMINE (BENADRYL) injection 25 mg (has no administration in time range)  docusate sodium (COLACE) capsule 100 mg (has no administration in time range)  polyethylene glycol (MIRALAX / GLYCOLAX) packet 17 g (has no administration in time range)  bisacodyl (DULCOLAX) suppository 10 mg (has no administration in time range)  ondansetron (ZOFRAN-ODT) disintegrating tablet 4 mg (has no administration in time range)    Or  ondansetron (ZOFRAN) injection 4 mg (has no administration in time range)  simethicone (MYLICON) chewable tablet 40 mg (has no administration in time range)  pantoprazole (PROTONIX) injection 40 mg (has no administration in time range)  hydrALAZINE (APRESOLINE) injection 10 mg (has no administration in time range)  insulin aspart (novoLOG)  injection 0-15 Units (has no administration in time range)  Chlorhexidine Gluconate Cloth 2 % PADS 6 each (has no administration in time range)    And   Chlorhexidine Gluconate Cloth 2 % PADS 6 each (has no administration in time range)  acetaminophen (TYLENOL) tablet 1,000 mg (has no administration in time range)  celecoxib (CELEBREX) capsule 200 mg (has no administration in time range)  gabapentin (NEURONTIN) capsule 300 mg (has no administration in time range)  HYDROmorphone (DILAUDID) injection 1 mg (has no administration in time range)  sodium chloride flush (NS) 0.9 % injection 3 mL (3 mLs Intravenous Given by Other 09/19/18 0727)  morphine 4 MG/ML injection 4 mg (4 mg Intravenous Given 09/19/18 0727)  ondansetron (ZOFRAN) injection 4 mg (4 mg Intravenous Given 09/19/18 0726)  sodium chloride 0.9 % bolus 500 mL (500 mLs Intravenous New Bag/Given 09/19/18 0726)  iohexol (OMNIPAQUE) 300 MG/ML solution 100 mL (100 mLs Intravenous Contrast Given 09/19/18 0747)  HYDROmorphone (DILAUDID) injection 1 mg (1 mg Intravenous Given 09/19/18 0840)    Mobility walks Low fall risk   Focused Assessments Pulmonary Assessment Handoff:  Lung sounds:   O2 Device: Room Air        R Recommendations: See Admitting Provider Note  Report given to: Louellen Molder RN  Additional Notes:

## 2018-09-19 NOTE — ED Notes (Signed)
Culture sent in addition to UA 

## 2018-09-20 ENCOUNTER — Encounter (HOSPITAL_COMMUNITY): Payer: Self-pay | Admitting: Surgery

## 2018-09-20 DIAGNOSIS — E785 Hyperlipidemia, unspecified: Secondary | ICD-10-CM | POA: Diagnosis present

## 2018-09-20 DIAGNOSIS — Z6841 Body Mass Index (BMI) 40.0 and over, adult: Secondary | ICD-10-CM | POA: Diagnosis not present

## 2018-09-20 DIAGNOSIS — Z794 Long term (current) use of insulin: Secondary | ICD-10-CM | POA: Diagnosis not present

## 2018-09-20 DIAGNOSIS — F410 Panic disorder [episodic paroxysmal anxiety] without agoraphobia: Secondary | ICD-10-CM | POA: Diagnosis present

## 2018-09-20 DIAGNOSIS — Z833 Family history of diabetes mellitus: Secondary | ICD-10-CM | POA: Diagnosis not present

## 2018-09-20 DIAGNOSIS — E669 Obesity, unspecified: Secondary | ICD-10-CM | POA: Diagnosis present

## 2018-09-20 DIAGNOSIS — I10 Essential (primary) hypertension: Secondary | ICD-10-CM | POA: Diagnosis present

## 2018-09-20 DIAGNOSIS — K5900 Constipation, unspecified: Secondary | ICD-10-CM | POA: Diagnosis present

## 2018-09-20 DIAGNOSIS — F329 Major depressive disorder, single episode, unspecified: Secondary | ICD-10-CM | POA: Diagnosis present

## 2018-09-20 DIAGNOSIS — Z791 Long term (current) use of non-steroidal anti-inflammatories (NSAID): Secondary | ICD-10-CM | POA: Diagnosis not present

## 2018-09-20 DIAGNOSIS — Z1159 Encounter for screening for other viral diseases: Secondary | ICD-10-CM | POA: Diagnosis not present

## 2018-09-20 DIAGNOSIS — G894 Chronic pain syndrome: Secondary | ICD-10-CM | POA: Diagnosis present

## 2018-09-20 DIAGNOSIS — K219 Gastro-esophageal reflux disease without esophagitis: Secondary | ICD-10-CM | POA: Diagnosis present

## 2018-09-20 DIAGNOSIS — F4310 Post-traumatic stress disorder, unspecified: Secondary | ICD-10-CM | POA: Diagnosis present

## 2018-09-20 DIAGNOSIS — E119 Type 2 diabetes mellitus without complications: Secondary | ICD-10-CM | POA: Diagnosis present

## 2018-09-20 DIAGNOSIS — R1031 Right lower quadrant pain: Secondary | ICD-10-CM | POA: Diagnosis present

## 2018-09-20 DIAGNOSIS — Z79899 Other long term (current) drug therapy: Secondary | ICD-10-CM | POA: Diagnosis not present

## 2018-09-20 DIAGNOSIS — K3580 Unspecified acute appendicitis: Secondary | ICD-10-CM | POA: Diagnosis present

## 2018-09-20 LAB — GLUCOSE, CAPILLARY
Glucose-Capillary: 308 mg/dL — ABNORMAL HIGH (ref 70–99)
Glucose-Capillary: 193 mg/dL — ABNORMAL HIGH (ref 70–99)
Glucose-Capillary: 134 mg/dL — ABNORMAL HIGH (ref 70–99)
Glucose-Capillary: 174 mg/dL — ABNORMAL HIGH (ref 70–99)
Glucose-Capillary: 162 mg/dL — ABNORMAL HIGH (ref 70–99)

## 2018-09-20 LAB — URINE CULTURE: Culture: >=100000 — AB

## 2018-09-20 LAB — HEMOGLOBIN A1C
Hgb A1c MFr Bld: 12.2 % — ABNORMAL HIGH (ref 4.8–5.6)
Mean Plasma Glucose: 303.44 mg/dL

## 2018-09-20 MED ORDER — METHOCARBAMOL 1000 MG/10ML IJ SOLN: 1000.0000 mg | Freq: Four times a day (QID) | INTRAVENOUS | Status: DC | PRN

## 2018-09-20 MED ORDER — SODIUM CHLORIDE 0.9% FLUSH: 3.0000 mL | INTRAVENOUS | Status: DC | PRN

## 2018-09-20 MED ORDER — PANTOPRAZOLE SODIUM 40 MG PO TBEC
40.0000 mg | DELAYED_RELEASE_TABLET | Freq: Every day | ORAL | Status: DC
  Administered 2018-09-20 – 2018-09-21 (×2): 40 mg via ORAL
  Filled 2018-09-20 (×2): qty 1

## 2018-09-20 MED ORDER — NORTRIPTYLINE HCL 25 MG PO CAPS
25.0000 mg | ORAL_CAPSULE | Freq: Every day | ORAL | Status: DC
  Administered 2018-09-20: 25 mg via ORAL
  Filled 2018-09-20 (×2): qty 1

## 2018-09-20 MED ORDER — ATORVASTATIN CALCIUM 10 MG PO TABS
20.0000 mg | ORAL_TABLET | Freq: Every day | ORAL | Status: DC
  Administered 2018-09-20: 20 mg via ORAL
  Filled 2018-09-20: qty 2

## 2018-09-20 MED ORDER — SODIUM CHLORIDE 0.9% FLUSH: 3.0000 mL | Freq: Two times a day (BID) | INTRAVENOUS | Status: DC

## 2018-09-20 MED ORDER — GABAPENTIN 300 MG PO CAPS
300.0000 mg | ORAL_CAPSULE | Freq: Two times a day (BID) | ORAL | Status: DC
  Administered 2018-09-20 – 2018-09-21 (×3): 300 mg via ORAL
  Filled 2018-09-20 (×3): qty 1

## 2018-09-20 MED ORDER — HYDROCODONE-ACETAMINOPHEN 10-325 MG PO TABS
1.0000 | ORAL_TABLET | Freq: Four times a day (QID) | ORAL | Status: DC | PRN
  Administered 2018-09-20: 2 via ORAL
  Administered 2018-09-20 – 2018-09-21 (×2): 1 via ORAL
  Filled 2018-09-20: qty 1
  Filled 2018-09-20: qty 2
  Filled 2018-09-20: qty 1

## 2018-09-20 MED ORDER — LORAZEPAM 0.5 MG PO TABS: 0.5000 mg | ORAL_TABLET | Freq: Two times a day (BID) | ORAL | Status: DC | PRN

## 2018-09-20 MED ORDER — DICLOFENAC SODIUM 25 MG PO TBEC
50.0000 mg | DELAYED_RELEASE_TABLET | Freq: Two times a day (BID) | ORAL | Status: DC
  Administered 2018-09-20 – 2018-09-21 (×3): 50 mg via ORAL
  Filled 2018-09-20 (×4): qty 2
  Filled 2018-09-20: qty 1

## 2018-09-20 MED ORDER — SODIUM CHLORIDE 0.9 % IV SOLN: 250.0000 mL | INTRAVENOUS | Status: DC | PRN

## 2018-09-20 MED ORDER — INSULIN ASPART 100 UNIT/ML ~~LOC~~ SOLN: 0.0000 [IU] | Freq: Three times a day (TID) | SUBCUTANEOUS | Status: DC

## 2018-09-20 MED ORDER — METHOCARBAMOL 500 MG PO TABS
1000.0000 mg | ORAL_TABLET | Freq: Four times a day (QID) | ORAL | Status: DC | PRN
  Administered 2018-09-20 – 2018-09-21 (×2): 1000 mg via ORAL
  Filled 2018-09-20 (×2): qty 2

## 2018-09-20 MED ORDER — INSULIN GLARGINE 100 UNIT/ML ~~LOC~~ SOLN
140.0000 [IU] | Freq: Every day | SUBCUTANEOUS | Status: DC
  Administered 2018-09-20 – 2018-09-21 (×2): 140 [IU] via SUBCUTANEOUS
  Filled 2018-09-20 (×2): qty 1.4

## 2018-09-20 MED ORDER — FUROSEMIDE 80 MG PO TABS
80.0000 mg | ORAL_TABLET | Freq: Every day | ORAL | Status: DC
  Administered 2018-09-20 – 2018-09-21 (×2): 80 mg via ORAL
  Filled 2018-09-20 (×2): qty 1

## 2018-09-20 MED ORDER — INSULIN ASPART 100 UNIT/ML ~~LOC~~ SOLN: 0.0000 [IU] | Freq: Every day | SUBCUTANEOUS | Status: DC

## 2018-09-20 MED ORDER — DULOXETINE HCL 60 MG PO CPEP
60.0000 mg | ORAL_CAPSULE | Freq: Two times a day (BID) | ORAL | Status: DC
  Administered 2018-09-20 – 2018-09-21 (×3): 60 mg via ORAL
  Filled 2018-09-20 (×3): qty 1

## 2018-09-20 MED ORDER — INSULIN ASPART 100 UNIT/ML ~~LOC~~ SOLN
0.0000 [IU] | Freq: Three times a day (TID) | SUBCUTANEOUS | Status: DC
  Administered 2018-09-20 (×2): 4 [IU] via SUBCUTANEOUS
  Administered 2018-09-20: 15 [IU] via SUBCUTANEOUS

## 2018-09-20 MED ORDER — ACETAMINOPHEN 500 MG PO TABS
1000.0000 mg | ORAL_TABLET | Freq: Three times a day (TID) | ORAL | Status: DC
  Administered 2018-09-20 – 2018-09-21 (×3): 1000 mg via ORAL
  Filled 2018-09-20 (×4): qty 2

## 2018-09-20 MED ORDER — CARVEDILOL 3.125 MG PO TABS
3.1250 mg | ORAL_TABLET | Freq: Two times a day (BID) | ORAL | Status: DC
  Administered 2018-09-20 – 2018-09-21 (×3): 3.125 mg via ORAL
  Filled 2018-09-20 (×3): qty 1

## 2018-09-20 NOTE — Progress Notes (Signed)
[name] [id number] 1960-04-02  CARE TEAM:  PCP: Carlena Hurl, PA-C  Outpatient Care Team: Patient Care Team: Tysinger, Camelia Eng, PA-C as PCP - General (Family Medicine)  Inpatient Treatment Team: Treatment Team: Attending Provider: Edison Pace, Md, MD; Consulting Physician: Edison Pace, Md, MD; Technician: Georgann Housekeeper, NT; Technician: Merton Border, NT   Problem List:   Principal Problem:   Acute appendicitis Active Problems:   Uncontrolled diabetes mellitus with complications ([place])   Class 1 obesity with serious comorbidity in adult   Chronic pain syndrome   Panic attack   Gastroesophageal reflux disease without esophagitis   PTSD (post-traumatic stress disorder)   1 Day Post-Op  09/19/2018  Surgeon: Clovis Riley  Assistant: none  Procedure performed: Laparoscopic Appendectomy  Preop diagnosis: Acute appendicitis  Post-op diagnosis/intraop findings: Acute appendicitis  SURGERY 09/12/2018  Laparoscopic Cholecystectomy  Indications: This patient presents with symptomatic cholelithiasis and probable early acute cholecystitis.   Pre-operative Diagnosis: symptomatic cholelithiasis  Post-operative Diagnosis: acute cholecystitis  Surgeon: Stark Klein   Assessment  ok  [facility] Stay = 0 days)  Plan:  -DM control - inc SSI.  Check HgbA1C -improve pain control -ml IVF -ADAT solids IV ABx chroinic pain regimen PPI for gerd -VTE prophylaxis- SCDs, etc -mobilize as tolerated to help recovery  D/C patient from hospital when patient meets criteria (anticipate in 1-2 day(s)):  Tolerating oral intake well Ambulating well Adequate pain control without IV medications Urinating  Having flatus Disposition planning in place   25 minutes spent in review, evaluation, examination, counseling, and coordination of care.  More than 50% of that time was spent in counseling.  09/20/2018    Subjective: (Chief complaint)  Sore - needing IV  meds Walked in room glc > 300  Objective:  Vital signs:  Vitals:   09/19/18 1927 09/19/18 1953 09/20/18 0159 09/20/18 0517  BP: (!) 160/82 (!) 157/87 (!) 153/81 140/78  Pulse: 89 88 (!) 105 99  Resp: 15 18 18 18   Temp: 98.8 F (37.1 C) 97.7 F (36.5 C) 99 F (37.2 C) 98.6 F (37 C)  TempSrc:  Oral Oral Oral  SpO2: 93% 97% 96% 93%  Weight:      Height:           Intake/Output   Yesterday:  07/10 0701 - 07/11 0700 In: 1842.5 [P.O.:360; I.V.:1182.5; IV Piggyback:300] Out: 1430 [Urine:1400; Blood:30] This shift:  No intake/output data recorded.  Bowel function:  Flatus: YES  BM:  No  Drain: (No drain)   Physical Exam:  General: Pt awake/alert/oriented x4 in no acute distress Eyes: PERRL, normal EOM.  Sclera clear.  No icterus Neuro: CN II-XII intact w/o focal sensory/motor deficits. Lymph: No head/neck/groin lymphadenopathy Psych:  No delerium/psychosis/paranoia HENT: Normocephalic, Mucus membranes moist.  No thrush Neck: Supple, No tracheal deviation Chest: No chest wall pain w good excursion CV:  Pulses intact.  Regular rhythm MS: Normal AROM mjr joints.  No obvious deformity  Abdomen: Soft.  Mildy distended.  Mildly tender at incisions only.  No evidence of peritonitis.  No incarcerated hernias.  Ext:  No deformity.  No mjr edema.  No cyanosis Skin: No petechiae / purpura  Results:   Cultures: Recent Results (from the past 720 hour(s))  SARS Coronavirus 2 (CEPHEID - Performed in [place] hospital lab), Hosp Order     Status: None   Collection Time: 09/11/18  5:21 PM   Specimen: Nasopharyngeal Swab  Result Value Ref Range Status   SARS Coronavirus 2  NEGATIVE NEGATIVE Final    Comment: (NOTE) If result is NEGATIVE SARS-CoV-2 target nucleic acids are NOT DETECTED. The SARS-CoV-2 RNA is generally detectable in upper and lower  respiratory specimens during the acute phase of infection. The lowest  concentration of SARS-CoV-2 viral copies this  assay can detect is 250  copies / mL. A negative result does not preclude SARS-CoV-2 infection  and should not be used as the sole basis for treatment or other  patient management decisions.  A negative result may occur with  improper specimen collection / handling, submission of specimen other  than nasopharyngeal swab, presence of viral mutation(s) within the  areas targeted by this assay, and inadequate number of viral copies  (<250 copies / mL). A negative result must be combined with clinical  observations, patient history, and epidemiological information. If result is POSITIVE SARS-CoV-2 target nucleic acids are DETECTED. The SARS-CoV-2 RNA is generally detectable in upper and lower  respiratory specimens dur ing the acute phase of infection.  Positive  results are indicative of active infection with SARS-CoV-2.  Clinical  correlation with patient history and other diagnostic information is  necessary to determine patient infection status.  Positive results do  not rule out bacterial infection or co-infection with other viruses. If result is PRESUMPTIVE POSTIVE SARS-CoV-2 nucleic acids MAY BE PRESENT.   A presumptive positive result was obtained on the submitted specimen  and confirmed on repeat testing.  While 2019 novel coronavirus  (SARS-CoV-2) nucleic acids may be present in the submitted sample  additional confirmatory testing may be necessary for epidemiological  and / or clinical management purposes  to differentiate between  SARS-CoV-2 and other Sarbecovirus currently known to infect humans.  If clinically indicated additional testing with an alternate test  methodology [phone]) is advised. The SARS-CoV-2 RNA is generally  detectable in upper and lower respiratory sp ecimens during the acute  phase of infection. The expected result is Negative. Fact Sheet for Patients:  StrictlyIdeas.no Fact Sheet for Healthcare  Providers: BankingDealers.co.za This test is not yet approved or cleared by the [place] FDA and has been authorized for detection and/or diagnosis of SARS-CoV-2 by FDA under an Emergency Use Authorization (EUA).  This EUA will remain in effect (meaning this test can be used) for the duration of the COVID-19 declaration under Section 564(b)(1) of the Act, 21 U.S.C. section 360bbb-3(b)(1), unless the authorization is terminated or revoked sooner. Performed at [facility], [place] [address], [place], [place] [address]   Surgical pcr screen     Status: None   Collection Time: 09/11/18  6:40 PM   Specimen: Nasal Mucosa; Nasal Swab  Result Value Ref Range Status   MRSA, PCR NEGATIVE NEGATIVE Final   Staphylococcus aureus NEGATIVE NEGATIVE Final    Comment: (NOTE) The Xpert SA Assay (FDA approved for NASAL specimens in patients 25 years of age and older), is one component of a comprehensive surveillance program. It is not intended to diagnose infection nor to guide or monitor treatment. Performed at [facility], [place] [address] [name], [place] [address]   SARS Coronavirus 2 (CEPHEID - Performed in [place] hospital lab), Hosp Order     Status: None   Collection Time: 09/19/18  8:25 AM   Specimen: Nasopharyngeal Swab  Result Value Ref Range Status   SARS Coronavirus 2 NEGATIVE NEGATIVE Final    Comment: (NOTE) If result is NEGATIVE SARS-CoV-2 target nucleic acids are NOT DETECTED. The SARS-CoV-2 RNA is generally detectable  in upper and lower  respiratory specimens during the acute phase of infection. The lowest  concentration of SARS-CoV-2 viral copies this assay can detect is 250  copies / mL. A negative result does not preclude SARS-CoV-2 infection  and should not be used as the sole basis for treatment or other  patient management decisions.  A negative result may occur with  improper specimen collection / handling, submission of specimen  other  than nasopharyngeal swab, presence of viral mutation(s) within the  areas targeted by this assay, and inadequate number of viral copies  (<250 copies / mL). A negative result must be combined with clinical  observations, patient history, and epidemiological information. If result is POSITIVE SARS-CoV-2 target nucleic acids are DETECTED. The SARS-CoV-2 RNA is generally detectable in upper and lower  respiratory specimens dur ing the acute phase of infection.  Positive  results are indicative of active infection with SARS-CoV-2.  Clinical  correlation with patient history and other diagnostic information is  necessary to determine patient infection status.  Positive results do  not rule out bacterial infection or co-infection with other viruses. If result is PRESUMPTIVE POSTIVE SARS-CoV-2 nucleic acids MAY BE PRESENT.   A presumptive positive result was obtained on the submitted specimen  and confirmed on repeat testing.  While 2019 novel coronavirus  (SARS-CoV-2) nucleic acids may be present in the submitted sample  additional confirmatory testing may be necessary for epidemiological  and / or clinical management purposes  to differentiate between  SARS-CoV-2 and other Sarbecovirus currently known to infect humans.  If clinically indicated additional testing with an alternate test  methodology [phone]) is advised. The SARS-CoV-2 RNA is generally  detectable in upper and lower respiratory sp ecimens during the acute  phase of infection. The expected result is Negative. Fact Sheet for Patients:  StrictlyIdeas.no Fact Sheet for Healthcare Providers: BankingDealers.co.za This test is not yet approved or cleared by the [place] FDA and has been authorized for detection and/or diagnosis of SARS-CoV-2 by FDA under an Emergency Use Authorization (EUA).  This EUA will remain in effect (meaning this test can be used) for the duration  of the COVID-19 declaration under Section 564(b)(1) of the Act, 21 U.S.C. section 360bbb-3(b)(1), unless the authorization is terminated or revoked sooner. Performed at [facility], [place] [address], [place], [place] [address]   Surgical pcr screen     Status: None   Collection Time: 09/19/18 12:34 PM   Specimen: Nasal Mucosa; Nasal Swab  Result Value Ref Range Status   MRSA, PCR NEGATIVE NEGATIVE Final   Staphylococcus aureus NEGATIVE NEGATIVE Final    Comment: (NOTE) The Xpert SA Assay (FDA approved for NASAL specimens in patients 67 years of age and older), is one component of a comprehensive surveillance program. It is not intended to diagnose infection nor to guide or monitor treatment. Performed at [facility], [place] [address], [place], [place] [address]     Labs: Results for orders placed or performed during the hospital encounter of 09/19/18 (from the past 48 hour(s))  Urinalysis, Routine w reflex microscopic     Status: Abnormal   Collection Time: 09/19/18  6:07 AM  Result Value Ref Range   Color, Urine YELLOW YELLOW   APPearance CLEAR CLEAR   Specific Gravity, Urine 1.031 (H) 1.005 - 1.030   pH 6.0 5.0 - 8.0   Glucose, UA >=500 (A) NEGATIVE mg/dL   Hgb urine dipstick NEGATIVE NEGATIVE   Bilirubin Urine NEGATIVE NEGATIVE  Ketones, ur NEGATIVE NEGATIVE mg/dL   Protein, ur NEGATIVE NEGATIVE mg/dL   Nitrite NEGATIVE NEGATIVE   Leukocytes,Ua TRACE (A) NEGATIVE   RBC / HPF 0-5 0 - 5 RBC/hpf   WBC, UA 0-5 0 - 5 WBC/hpf   Bacteria, UA RARE (A) NONE SEEN   Squamous Epithelial / LPF 0-5 0 - 5    Comment: Performed at [facility], [place] [address], [place], [place] [address]  Lipase, blood     Status: None   Collection Time: 09/19/18  6:13 AM  Result Value Ref Range   Lipase 42 11 - 51 U/L    Comment: Performed at [place] [address], [place], [place] [address]  Comprehensive metabolic panel     Status: Abnormal   Collection Time:  09/19/18  6:13 AM  Result Value Ref Range   Sodium 137 135 - 145 mmol/L   Potassium 4.1 3.5 - 5.1 mmol/L   Chloride 101 98 - 111 mmol/L   CO2 25 22 - 32 mmol/L   Glucose, Bld 279 (H) 70 - 99 mg/dL   BUN 10 6 - 20 mg/dL   Creatinine, Ser 1.28 (H) 0.44 - 1.00 mg/dL   Calcium 8.9 8.9 - 10.3 mg/dL   Total Protein 6.5 6.5 - 8.1 g/dL   Albumin 2.8 (L) 3.5 - 5.0 g/dL   AST 22 15 - 41 U/L   ALT 34 0 - 44 U/L   Alkaline Phosphatase 140 (H) 38 - 126 U/L   Total Bilirubin 0.7 0.3 - 1.2 mg/dL   GFR calc non Af Amer 46 (L) >60 mL/min   GFR calc Af Amer 54 (L) >60 mL/min   Anion gap 11 5 - 15    Comment: Performed at [facility] [address], [place], [place] [address]  CBC     Status: Abnormal   Collection Time: 09/19/18  6:13 AM  Result Value Ref Range   WBC 18.9 (H) 4.0 - 10.5 K/uL   RBC 4.60 3.87 - 5.11 MIL/uL   Hemoglobin 10.5 (L) 12.0 - 15.0 g/dL   HCT 34.8 (L) 36.0 - 46.0 %   MCV 75.7 (L) 80.0 - 100.0 fL   MCH 22.8 (L) 26.0 - 34.0 pg   MCHC 30.2 30.0 - 36.0 g/dL   RDW 14.7 11.5 - 15.5 %   Platelets 519 (H) 150 - 400 K/uL   nRBC 0.0 0.0 - 0.2 %    Comment: Performed at [facility] [address], [place], [place] [address]  SARS Coronavirus 2 (CEPHEID - Performed in [place] hospital lab), Hosp Order     Status: None   Collection Time: 09/19/18  8:25 AM   Specimen: Nasopharyngeal Swab  Result Value Ref Range   SARS Coronavirus 2 NEGATIVE NEGATIVE    Comment: (NOTE) If result is NEGATIVE SARS-CoV-2 target nucleic acids are NOT DETECTED. The SARS-CoV-2 RNA is generally detectable in upper and lower  respiratory specimens during the acute phase of infection. The lowest  concentration of SARS-CoV-2 viral copies this assay can detect is 250  copies / mL. A negative result does not preclude SARS-CoV-2 infection  and should not be used as the sole basis for treatment or other  patient management decisions.  A negative result may occur with  improper specimen  collection / handling, submission of specimen other  than nasopharyngeal swab, presence of viral mutation(s) within the  areas targeted by this assay, and inadequate number  of viral copies  (<250 copies / mL). A negative result must be combined with clinical  observations, patient history, and epidemiological information. If result is POSITIVE SARS-CoV-2 target nucleic acids are DETECTED. The SARS-CoV-2 RNA is generally detectable in upper and lower  respiratory specimens dur ing the acute phase of infection.  Positive  results are indicative of active infection with SARS-CoV-2.  Clinical  correlation with patient history and other diagnostic information is  necessary to determine patient infection status.  Positive results do  not rule out bacterial infection or co-infection with other viruses. If result is PRESUMPTIVE POSTIVE SARS-CoV-2 nucleic acids MAY BE PRESENT.   A presumptive positive result was obtained on the submitted specimen  and confirmed on repeat testing.  While 2019 novel coronavirus  (SARS-CoV-2) nucleic acids may be present in the submitted sample  additional confirmatory testing may be necessary for epidemiological  and / or clinical management purposes  to differentiate between  SARS-CoV-2 and other Sarbecovirus currently known to infect humans.  If clinically indicated additional testing with an alternate test  methodology [phone]) is advised. The SARS-CoV-2 RNA is generally  detectable in upper and lower respiratory sp ecimens during the acute  phase of infection. The expected result is Negative. Fact Sheet for Patients:  StrictlyIdeas.no Fact Sheet for Healthcare Providers: BankingDealers.co.za This test is not yet approved or cleared by the [place] FDA and has been authorized for detection and/or diagnosis of SARS-CoV-2 by FDA under an Emergency Use Authorization (EUA).  This EUA will remain in effect  (meaning this test can be used) for the duration of the COVID-19 declaration under Section 564(b)(1) of the Act, 21 U.S.C. section 360bbb-3(b)(1), unless the authorization is terminated or revoked sooner. Performed at [facility], [place] [address], [place], [place] [address]   Glucose, capillary     Status: Abnormal   Collection Time: 09/19/18 12:03 PM  Result Value Ref Range   Glucose-Capillary 319 (H) 70 - 99 mg/dL  Surgical pcr screen     Status: None   Collection Time: 09/19/18 12:34 PM   Specimen: Nasal Mucosa; Nasal Swab  Result Value Ref Range   MRSA, PCR NEGATIVE NEGATIVE   Staphylococcus aureus NEGATIVE NEGATIVE    Comment: (NOTE) The Xpert SA Assay (FDA approved for NASAL specimens in patients 75 years of age and older), is one component of a comprehensive surveillance program. It is not intended to diagnose infection nor to guide or monitor treatment. Performed at [facility], [place] [address] [place], [place] [address]   Glucose, capillary     Status: Abnormal   Collection Time: 09/19/18  3:14 PM  Result Value Ref Range   Glucose-Capillary 359 (H) 70 - 99 mg/dL  Glucose, capillary     Status: Abnormal   Collection Time: 09/19/18  5:59 PM  Result Value Ref Range   Glucose-Capillary 285 (H) 70 - 99 mg/dL   Comment 1 Notify RN   Glucose, capillary     Status: Abnormal   Collection Time: 09/19/18  8:04 PM  Result Value Ref Range   Glucose-Capillary 341 (H) 70 - 99 mg/dL    Imaging / Studies: Ct Abdomen Pelvis W Contrast  Result Date: 09/19/2018 CLINICAL DATA:  Abdominal pain. RIGHT lower quadrant pain radiating to RIGHT flank. Cholecystectomy on 09/12/2018. Appendicitis suspected. Postoperative complications suspected. EXAM: CT ABDOMEN AND PELVIS WITH CONTRAST TECHNIQUE: Multidetector CT imaging of the abdomen and pelvis was performed using the standard protocol following bolus administration of  intravenous contrast. CONTRAST:  17mL OMNIPAQUE IOHEXOL 300  MG/ML  SOLN COMPARISON:  CT abdomen dated 05/30/2016. FINDINGS: Lower chest: No acute abnormality. Hepatobiliary: No focal liver abnormality is seen. Status post cholecystectomy. Small amount of fluid within the gallbladder fossa is an expected finding status post recent cholecystectomy. No bile duct dilatation. Pancreas: Unremarkable. No pancreatic ductal dilatation or surrounding inflammatory changes. Spleen: Normal in size without focal abnormality. Adrenals/Urinary Tract: LEFT adrenal fullness is stable. RIGHT adrenal gland appears normal. Kidneys are unremarkable without suspicious mass, stone or hydronephrosis. No ureteral or bladder calculi identified. Bladder appears normal, partially decompressed. Stomach/Bowel: Appendix is slightly distended, measuring 8-9 mm diameter, with suspected wall thickening and amorphous margins, suspicious for early acute appendicitis (axial series 3, image 60; sagittal series 7, images 59 through 69; coronal series 6, images 59 through 68). Small amount of free fluid within the adjacent RIGHT lower quadrant. No appendicoliths seen. Appendix: Location: RIGHT lower quadrant Diameter: 8-9 mm Appendicolith: Not Seen Mucosal hyper-enhancement: Not seen Extraluminal gas: Not Seen Periappendiceal collection: No periappendiceal abscess. No dilated large or small bowel loops. Stomach is unremarkable, partially decompressed. Vascular/Lymphatic: Mild aortic atherosclerosis. No acute appearing vascular abnormality. No enlarged lymph nodes seen in the abdomen or pelvis. Reproductive: Leiomyomatous uterus. Adnexal regions are unremarkable. Other: No abscess collection seen. No free intraperitoneal air. Musculoskeletal: No acute or suspicious osseous finding. IMPRESSION: 1. Findings suspicious for early acute appendicitis, based on appendiceal diameter of 8-9 mm, but not definitive without convincing periappendiceal inflammation. Of note, the appendix had a normal caliber on CT of 05/30/2016  increasing my suspicion for early acute appendicitis. No evidence for perforation or abscess formation. 2. Status post recent cholecystectomy. Small amount of fluid within the gallbladder fossa is an expected finding status post recent cholecystectomy. 3. Leiomyomatous uterus. Electronically Signed   By: Franki Cabot M.D.   On: 09/19/2018 08:14    Medications / Allergies: per chart  Antibiotics: Anti-infectives (From admission, onward)   Start     Dose/Rate Route Frequency Ordered Stop   09/19/18 0915  cefTRIAXone (ROCEPHIN) 2 g in sodium chloride 0.9 % 100 mL IVPB  Status:  Discontinued     2 g 200 mL/hr over 30 Minutes Intravenous  Once 09/19/18 0900 09/19/18 0914   09/19/18 0915  metroNIDAZOLE (FLAGYL) IVPB 500 mg  Status:  Discontinued     500 mg 100 mL/hr over 60 Minutes Intravenous  Once 09/19/18 0900 09/19/18 0914   09/19/18 0915  cefTRIAXone (ROCEPHIN) 2 g in sodium chloride 0.9 % 100 mL IVPB     2 g 200 mL/hr over 30 Minutes Intravenous Every 24 hours 09/19/18 0914     09/19/18 0915  metroNIDAZOLE (FLAGYL) IVPB 500 mg     500 mg 100 mL/hr over 60 Minutes Intravenous Every 8 hours 09/19/18 0914          Note: Portions of this report may have been transcribed using voice recognition software. Every effort was made to ensure accuracy; however, inadvertent computerized transcription errors may be present.   Any transcriptional errors that result from this process are unintentional.     Adin Hector, MD, FACS, MASCRS Gastrointestinal and Minimally Invasive Surgery    [address], [place], [place] [address] [phone] Main / Paging [phone] Fax

## 2018-09-20 NOTE — Anesthesia Postprocedure Evaluation (Signed)
Anesthesia Post Note  Patient: [name]  Procedure(s) Performed: APPENDECTOMY LAPAROSCOPIC (N/A Abdomen)     Patient location during evaluation: PACU Anesthesia Type: General Level of consciousness: awake and alert Pain management: pain level controlled Vital Signs Assessment: post-procedure vital signs reviewed and stable Respiratory status: spontaneous breathing, nonlabored ventilation and respiratory function stable Cardiovascular status: blood pressure returned to baseline and stable Postop Assessment: no apparent nausea or vomiting Anesthetic complications: no    Last Vitals:  Vitals:   09/20/18 0159 09/20/18 0517  BP: (!) 153/81 140/78  Pulse: (!) 105 99  Resp: 18 18  Temp: 37.2 C 37 C  SpO2: 96% 93%    Last Pain:  Vitals:   09/20/18 0552  TempSrc:   PainSc: [place]

## 2018-09-21 ENCOUNTER — Encounter (HOSPITAL_COMMUNITY): Payer: Self-pay | Admitting: Surgery

## 2018-09-21 LAB — GLUCOSE, CAPILLARY
Glucose-Capillary: 112 mg/dL — ABNORMAL HIGH (ref 70–99)
Glucose-Capillary: 213 mg/dL — ABNORMAL HIGH (ref 70–99)

## 2018-09-21 MED ORDER — METRONIDAZOLE 500 MG PO TABS
500.0000 mg | ORAL_TABLET | Freq: Three times a day (TID) | ORAL | Status: DC
  Administered 2018-09-21: 500 mg via ORAL
  Filled 2018-09-21: qty 1

## 2018-09-21 MED ORDER — METHOCARBAMOL 500 MG PO TABS: 1000.0000 mg | ORAL_TABLET | Freq: Four times a day (QID) | ORAL | 1 refills | Status: DC | PRN

## 2018-09-21 MED ORDER — OXYCODONE HCL 5 MG PO TABS: 5.0000 mg | ORAL_TABLET | Freq: Four times a day (QID) | ORAL | 0 refills | Status: DC | PRN

## 2018-09-21 NOTE — Discharge Summary (Signed)
Physician Discharge Summary    Patient ID: [name] MRN: [id number] DOB/AGE: 06/19/1960  58 y.o.  Patient Care Team: Tysinger, Camelia Eng, PA-C as PCP - General (Family Medicine) Renato Shin, MD as Consulting Physician (Endocrinology) Jovita Kussmaul, MD as Consulting Physician (General Surgery) Sanda Klein, MD as Consulting Physician (Cardiology)  Admit date: 09/19/2018  Discharge date: 09/21/2018  Hospital Stay = 1 days    Discharge Diagnoses:  Principal Problem:   Acute appendicitis Active Problems:   Uncontrolled diabetes mellitus with complications ([place])   Class 1 obesity with serious comorbidity in adult   Chronic pain syndrome   Panic attack   Gastroesophageal reflux disease without esophagitis   PTSD (post-traumatic stress disorder)   2 Days Post-Op  09/19/2018  POST-OPERATIVE DIAGNOSIS:   Acute appendicitis  SURGERY:  09/19/2018  Procedure(s): APPENDECTOMY LAPAROSCOPIC  SURGEON:    Surgeon(s): Clovis Riley, MD  Consults: None and cardiology  Hospital Course:   Insulin requiring diabetic with chronic cardiac disease.  Underwent urgent cholecystectomy last week.  Returns with pain and found to have appendicitis.  No evidence of any postcholecystectomy abscess.  The patient underwent the surgery above.  Postoperatively, the patient gradually mobilized and advanced to a solid diet.  Pain and other symptoms were treated aggressively.    By the time of discharge, the patient was walking well the hallways, eating food, having flatus.  Pain was well-controlled on an oral medications.  Based on meeting discharge criteria and continuing to recover, I felt it was safe for the patient to be discharged from the hospital to further recover with close followup. Postoperative recommendations were discussed in detail.  They are written as well.  Discharged Condition: good  Discharge Exam: Blood pressure 121/68, pulse 88, temperature 98.2 F (36.8  C), temperature source Oral, resp. rate 18, height 5\' 3"  (1.6 m), weight 113.9 kg, last menstrual period 02/09/2017, SpO2 99 %.  General: Pt awake/alert/oriented x4 in No acute distress Eyes: PERRL, normal EOM.  Sclera clear.  No icterus Neuro: CN II-XII intact w/o focal sensory/motor deficits. Lymph: No head/neck/groin lymphadenopathy Psych:  No delerium/psychosis/paranoia HENT: Normocephalic, Mucus membranes moist.  No thrush Neck: Supple, No tracheal deviation Chest: No chest wall pain w good excursion CV:  Pulses intact.  Regular rhythm MS: Normal AROM mjr joints.  No obvious deformity Abdomen: Soft.  Nondistended.  Mildly tender at incisions only.  No evidence of peritonitis.  No incarcerated hernias. Ext:  SCDs BLE.  No mjr edema.  No cyanosis Skin: No petechiae / purpura   Disposition:   Follow-up [facility] Call on 10/07/2018.   Specialty: General Surgery Why: 7/28 at 9:45am Due to coronavirus we are decreasing foot traffic in office. Instead of coming to an appt a provider will call you at the above date/time. Send picture of your incision with your name/date of birth to [email or web address] Contact information: [address] [place] [phone]          Discharge disposition: 01-Home or Self Care       Discharge Instructions    Call MD for:   Complete by: As directed    FEVER > 101.5 F  (temperatures < 101.5 F are not significant)   Call MD for:  extreme fatigue   Complete by: As directed    Call MD for:  persistant dizziness or light-headedness   Complete by: As directed    Call MD for:  persistant  nausea and vomiting   Complete by: As directed    Call MD for:  redness, tenderness, or signs of infection (pain, swelling, redness, odor or green/yellow discharge around incision site)   Complete by: As directed    Call MD for:  severe uncontrolled pain   Complete  by: As directed    Diet - low sodium heart healthy   Complete by: As directed    Start with a bland diet such as soups, liquids, starchy foods, low fat foods, etc. the first few days at home. Gradually advance to a solid, low-fat, high fiber diet by the end of the first week at home.   Add a fiber supplement to your diet (Metamucil, etc) If you feel full, bloated, or constipated, stay on a full liquid or pureed/blenderized diet for a few days until you feel better and are no longer constipated.   Discharge instructions   Complete by: As directed    See Discharge Instructions If you are not getting better after two weeks or are noticing you are getting worse, contact our office [phone] for further advice.  We may need to adjust your medications, re-evaluate you in the office, send you to the emergency room, or see what other things we can do to help. The clinic staff is available to answer your questions during regular business hours (8:30am-5pm).  Please don't hesitate to call and ask to speak to one of our nurses for clinical concerns.    A surgeon from [facility] is always on call at the hospitals 24 hours/day If you have a medical emergency, go to the nearest emergency room or call 911.   Discharge wound care:   Complete by: As directed    It is good for closed incisions and even open wounds to be washed every day.  Shower every day.  Short baths are fine.  Wash the incisions and wounds clean with soap & water.     You may leave closed incisions open to air if it is dry.   You may cover the incision with clean gauze & replace it after your daily shower for comfort.   You have skin tapes (Steristrips) on your incision, leave them in place.  They will fall off on their own like a scab.  You may trim any edges that curl up with clean scissors.   Driving Restrictions   Complete by: As directed    You may drive when: - you are no longer taking narcotic prescription pain  medication - you can comfortably wear a seatbelt - you can safely make sudden turns/stops without pain.   Increase activity slowly   Complete by: As directed    Start light daily activities --- self-care, walking, climbing stairs- beginning the day after surgery.  Gradually increase activities as tolerated.  Control your pain to be active.  Stop when you are tired.  Ideally, walk several times a day, eventually an hour a day.   Most people are back to most day-to-day activities in a few weeks.  It takes 4-6 weeks to get back to unrestricted, intense activity. If you can walk 30 minutes without difficulty, it is safe to try more intense activity such as jogging, treadmill, bicycling, low-impact aerobics, swimming, etc. Save the most intensive and strenuous activity for last (Usually 4-8 weeks after surgery) such as sit-ups, heavy lifting, contact sports, etc.  Refrain from any intense heavy lifting or straining until you are off narcotics for pain control.  You will have off days, but things should improve week-by-week. DO NOT PUSH THROUGH PAIN.  Let pain be your guide: If it hurts to do something, don't do it.   Lifting restrictions   Complete by: As directed    If you can walk 30 minutes without difficulty, it is safe to try more intense activity such as jogging, treadmill, bicycling, low-impact aerobics, swimming, etc. Save the most intensive and strenuous activity for last (Usually 4-8 weeks after surgery) such as sit-ups, heavy lifting, contact sports, etc.   Refrain from any intense heavy lifting or straining until you are off narcotics for pain control.  You will have off days, but things should improve week-by-week. DO NOT PUSH THROUGH PAIN.  Let pain be your guide: If it hurts to do something, don't do it.  Pain is your body warning you to avoid that activity for another week until the pain goes down.   May shower / Bathe   Complete by: As directed    May walk up steps   Complete by: As  directed    Sexual Activity Restrictions   Complete by: As directed    You may have sexual intercourse when it is comfortable. If it hurts to do something, stop.      Allergies as of 09/21/2018      Reactions   Tramadol Nausea Only      Medication List    STOP taking these medications   HYDROcodone-acetaminophen 10-325 MG tablet Commonly known as: NORCO     TAKE these medications   atorvastatin 20 MG tablet Commonly known as: LIPITOR TAKE 1 TABLET BY MOUTH AT BEDTIME   Basaglar KwikPen 100 UNIT/ML Sopn Inject 1.4 mLs (140 Units total) into the skin every morning. And pen needles 1/day   carvedilol 3.125 MG tablet Commonly known as: COREG Take 1 tablet (3.125 mg total) by mouth 2 (two) times daily. What changed: when to take this   dexlansoprazole 60 MG capsule Commonly known as: Dexilant Take 1 capsule (60 mg total) by mouth daily.   diclofenac 50 MG EC tablet Commonly known as: VOLTAREN Take 1 tablet by mouth twice daily   DULoxetine 60 MG capsule Commonly known as: CYMBALTA Take 1 capsule by mouth twice daily   furosemide 80 MG tablet Commonly known as: LASIX Take 1 tablet by mouth once daily   LORazepam 0.5 MG tablet Commonly known as: ATIVAN Take 1 tablet (0.5 mg total) by mouth 2 (two) times daily as needed. for anxiety   methocarbamol 500 MG tablet Commonly known as: ROBAXIN Take 2 tablets (1,000 mg total) by mouth every 6 (six) hours as needed for muscle spasms.   nortriptyline 25 MG capsule Commonly known as: Pamelor Take 1 capsule (25 mg total) by mouth at bedtime.   oxyCODONE 5 MG immediate release tablet Commonly known as: Oxy IR/ROXICODONE Take 1-2 tablets (5-10 mg total) by mouth every 6 (six) hours as needed for severe pain or breakthrough pain. What changed:   when to take this  reasons to take this            Discharge Care Instructions  (From admission, onward)         Start     Ordered   09/21/18 0000  Discharge wound  care:    Comments: It is good for closed incisions and even open wounds to be washed every day.  Shower every day.  Short baths are fine.  Wash the incisions and wounds clean with  soap & water.     You may leave closed incisions open to air if it is dry.   You may cover the incision with clean gauze & replace it after your daily shower for comfort.   You have skin tapes (Steristrips) on your incision, leave them in place.  They will fall off on their own like a scab.  You may trim any edges that curl up with clean scissors.   09/21/18 0758          Significant Diagnostic Studies:  Results for orders placed or performed during the hospital encounter of 09/19/18 (from the past 72 hour(s))  Urinalysis, Routine w reflex microscopic     Status: Abnormal   Collection Time: 09/19/18  6:07 AM  Result Value Ref Range   Color, Urine YELLOW YELLOW   APPearance CLEAR CLEAR   Specific Gravity, Urine 1.031 (H) 1.005 - 1.030   pH 6.0 5.0 - 8.0   Glucose, UA >=500 (A) NEGATIVE mg/dL   Hgb urine dipstick NEGATIVE NEGATIVE   Bilirubin Urine NEGATIVE NEGATIVE   Ketones, ur NEGATIVE NEGATIVE mg/dL   Protein, ur NEGATIVE NEGATIVE mg/dL   Nitrite NEGATIVE NEGATIVE   Leukocytes,Ua TRACE (A) NEGATIVE   RBC / HPF 0-5 0 - 5 RBC/hpf   WBC, UA 0-5 0 - 5 WBC/hpf   Bacteria, UA RARE (A) NONE SEEN   Squamous Epithelial / LPF 0-5 0 - 5    Comment: Performed at [facility], [address], [place], [place] [address]  Lipase, blood     Status: None   Collection Time: 09/19/18  6:13 AM  Result Value Ref Range   Lipase 42 11 - 51 U/L    Comment: Performed at [facility] [address], [place], [place] [address]  Comprehensive metabolic panel     Status: Abnormal   Collection Time: 09/19/18  6:13 AM  Result Value Ref Range   Sodium 137 135 - 145 mmol/L   Potassium 4.1 3.5 - 5.1 mmol/L   Chloride 101 98 - 111 mmol/L   CO2 25 22 - 32 mmol/L   Glucose, Bld 279 (H) 70 - 99 mg/dL   BUN 10 6 -  20 mg/dL   Creatinine, Ser 1.28 (H) 0.44 - 1.00 mg/dL   Calcium 8.9 8.9 - 10.3 mg/dL   Total Protein 6.5 6.5 - 8.1 g/dL   Albumin 2.8 (L) 3.5 - 5.0 g/dL   AST 22 15 - 41 U/L   ALT 34 0 - 44 U/L   Alkaline Phosphatase 140 (H) 38 - 126 U/L   Total Bilirubin 0.7 0.3 - 1.2 mg/dL   GFR calc non Af Amer 46 (L) >60 mL/min   GFR calc Af Amer 54 (L) >60 mL/min   Anion gap 11 5 - 15    Comment: Performed at [facility] [address], [place], [place] [address]  CBC     Status: Abnormal   Collection Time: 09/19/18  6:13 AM  Result Value Ref Range   WBC 18.9 (H) 4.0 - 10.5 K/uL   RBC 4.60 3.87 - 5.11 MIL/uL   Hemoglobin 10.5 (L) 12.0 - 15.0 g/dL   HCT 34.8 (L) 36.0 - 46.0 %   MCV 75.7 (L) 80.0 - 100.0 fL   MCH 22.8 (L) 26.0 - 34.0 pg   MCHC 30.2 30.0 - 36.0 g/dL   RDW 14.7 11.5 - 15.5 %   Platelets 519 (H) 150 - 400 K/uL  nRBC 0.0 0.0 - 0.2 %    Comment: Performed at [facility], [place] [address] [place], [place] [address]  Hemoglobin A1c     Status: Abnormal   Collection Time: 09/19/18  6:13 AM  Result Value Ref Range   Hgb A1c MFr Bld 12.2 (H) 4.8 - 5.6 %    Comment: (NOTE) Pre diabetes:          5.7%-6.4% Diabetes:              >6.4% Glycemic control for   <7.0% adults with diabetes    Mean Plasma Glucose 303.44 mg/dL    Comment: Performed at [facility] [address], [place], [place] [address]  SARS Coronavirus 2 (CEPHEID - Performed in [facility] lab), Hosp Order     Status: None   Collection Time: 09/19/18  8:25 AM   Specimen: Nasopharyngeal Swab  Result Value Ref Range   SARS Coronavirus 2 NEGATIVE NEGATIVE    Comment: (NOTE) If result is NEGATIVE SARS-CoV-2 target nucleic acids are NOT DETECTED. The SARS-CoV-2 RNA is generally detectable in upper and lower  respiratory specimens during the acute phase of infection. The lowest  concentration of SARS-CoV-2 viral copies this assay can detect is 250  copies / mL. A negative result does not  preclude SARS-CoV-2 infection  and should not be used as the sole basis for treatment or other  patient management decisions.  A negative result may occur with  improper specimen collection / handling, submission of specimen other  than nasopharyngeal swab, presence of viral mutation(s) within the  areas targeted by this assay, and inadequate number of viral copies  (<250 copies / mL). A negative result must be combined with clinical  observations, patient history, and epidemiological information. If result is POSITIVE SARS-CoV-2 target nucleic acids are DETECTED. The SARS-CoV-2 RNA is generally detectable in upper and lower  respiratory specimens dur ing the acute phase of infection.  Positive  results are indicative of active infection with SARS-CoV-2.  Clinical  correlation with patient history and other diagnostic information is  necessary to determine patient infection status.  Positive results do  not rule out bacterial infection or co-infection with other viruses. If result is PRESUMPTIVE POSTIVE SARS-CoV-2 nucleic acids MAY BE PRESENT.   A presumptive positive result was obtained on the submitted specimen  and confirmed on repeat testing.  While 2019 novel coronavirus  (SARS-CoV-2) nucleic acids may be present in the submitted sample  additional confirmatory testing may be necessary for epidemiological  and / or clinical management purposes  to differentiate between  SARS-CoV-2 and other Sarbecovirus currently known to infect humans.  If clinically indicated additional testing with an alternate test  methodology [phone]) is advised. The SARS-CoV-2 RNA is generally  detectable in upper and lower respiratory sp ecimens during the acute  phase of infection. The expected result is Negative. Fact Sheet for Patients:  StrictlyIdeas.no Fact Sheet for Healthcare Providers: BankingDealers.co.za This test is not yet approved or cleared by  the [place] FDA and has been authorized for detection and/or diagnosis of SARS-CoV-2 by FDA under an Emergency Use Authorization (EUA).  This EUA will remain in effect (meaning this test can be used) for the duration of the COVID-19 declaration under Section 564(b)(1) of the Act, 21 U.S.C. section 360bbb-3(b)(1), unless the authorization is terminated or revoked sooner. Performed at [facility], [place] [address] [place], [place] [address]   Urine Culture  Status: Abnormal   Collection Time: 09/19/18  9:19 AM   Specimen: Urine, Random  Result Value Ref Range   Specimen Description URINE, RANDOM    Special Requests      NONE Performed at [facility], [address], [place], [place] [address]    Culture (A)     >=100,000 COLONIES/mL MULTIPLE SPECIES PRESENT, SUGGEST RECOLLECTION   Report Status 09/20/2018 FINAL   Glucose, capillary     Status: Abnormal   Collection Time: 09/19/18 12:03 PM  Result Value Ref Range   Glucose-Capillary 319 (H) 70 - 99 mg/dL  Surgical pcr screen     Status: None   Collection Time: 09/19/18 12:34 PM   Specimen: Nasal Mucosa; Nasal Swab  Result Value Ref Range   MRSA, PCR NEGATIVE NEGATIVE   Staphylococcus aureus NEGATIVE NEGATIVE    Comment: (NOTE) The Xpert SA Assay (FDA approved for NASAL specimens in patients 65 years of age and older), is one component of a comprehensive surveillance program. It is not intended to diagnose infection nor to guide or monitor treatment. Performed at [facility], [place] [address], [place], [place] [address]   Glucose, capillary     Status: Abnormal   Collection Time: 09/19/18  3:14 PM  Result Value Ref Range   Glucose-Capillary 359 (H) 70 - 99 mg/dL  Glucose, capillary     Status: Abnormal   Collection Time: 09/19/18  5:59 PM  Result Value Ref Range   Glucose-Capillary 285 (H) 70 - 99 mg/dL   Comment 1 Notify RN   Glucose, capillary     Status: Abnormal   Collection Time: 09/19/18   8:04 PM  Result Value Ref Range   Glucose-Capillary 341 (H) 70 - 99 mg/dL  Glucose, capillary     Status: Abnormal   Collection Time: 09/20/18  8:21 AM  Result Value Ref Range   Glucose-Capillary 308 (H) 70 - 99 mg/dL  Glucose, capillary     Status: Abnormal   Collection Time: 09/20/18 11:46 AM  Result Value Ref Range   Glucose-Capillary 193 (H) 70 - 99 mg/dL  Glucose, capillary     Status: Abnormal   Collection Time: 09/20/18  3:19 PM  Result Value Ref Range   Glucose-Capillary 134 (H) 70 - 99 mg/dL  Glucose, capillary     Status: Abnormal   Collection Time: 09/20/18  5:04 PM  Result Value Ref Range   Glucose-Capillary 174 (H) 70 - 99 mg/dL  Glucose, capillary     Status: Abnormal   Collection Time: 09/20/18  8:29 PM  Result Value Ref Range   Glucose-Capillary 162 (H) 70 - 99 mg/dL  Glucose, capillary     Status: Abnormal   Collection Time: 09/21/18  7:57 AM  Result Value Ref Range   Glucose-Capillary 112 (H) 70 - 99 mg/dL    Ct Abdomen Pelvis W Contrast  Result Date: 09/19/2018 CLINICAL DATA:  Abdominal pain. RIGHT lower quadrant pain radiating to RIGHT flank. Cholecystectomy on 09/12/2018. Appendicitis suspected. Postoperative complications suspected. EXAM: CT ABDOMEN AND PELVIS WITH CONTRAST TECHNIQUE: Multidetector CT imaging of the abdomen and pelvis was performed using the standard protocol following bolus administration of intravenous contrast. CONTRAST:  138mL OMNIPAQUE IOHEXOL 300 MG/ML  SOLN COMPARISON:  CT abdomen dated 05/30/2016. FINDINGS: Lower chest: No acute abnormality. Hepatobiliary: No focal liver abnormality is seen. Status post cholecystectomy. Small amount of fluid within the gallbladder fossa is an expected finding status post recent cholecystectomy. No bile duct dilatation. Pancreas:  Unremarkable. No pancreatic ductal dilatation or surrounding inflammatory changes. Spleen: Normal in size without focal abnormality. Adrenals/Urinary Tract: LEFT adrenal fullness  is stable. RIGHT adrenal gland appears normal. Kidneys are unremarkable without suspicious mass, stone or hydronephrosis. No ureteral or bladder calculi identified. Bladder appears normal, partially decompressed. Stomach/Bowel: Appendix is slightly distended, measuring 8-9 mm diameter, with suspected wall thickening and amorphous margins, suspicious for early acute appendicitis (axial series 3, image 60; sagittal series 7, images 59 through 69; coronal series 6, images 59 through 68). Small amount of free fluid within the adjacent RIGHT lower quadrant. No appendicoliths seen. Appendix: Location: RIGHT lower quadrant Diameter: 8-9 mm Appendicolith: Not Seen Mucosal hyper-enhancement: Not seen Extraluminal gas: Not Seen Periappendiceal collection: No periappendiceal abscess. No dilated large or small bowel loops. Stomach is unremarkable, partially decompressed. Vascular/Lymphatic: Mild aortic atherosclerosis. No acute appearing vascular abnormality. No enlarged lymph nodes seen in the abdomen or pelvis. Reproductive: Leiomyomatous uterus. Adnexal regions are unremarkable. Other: No abscess collection seen. No free intraperitoneal air. Musculoskeletal: No acute or suspicious osseous finding. IMPRESSION: 1. Findings suspicious for early acute appendicitis, based on appendiceal diameter of 8-9 mm, but not definitive without convincing periappendiceal inflammation. Of note, the appendix had a normal caliber on CT of 05/30/2016 increasing my suspicion for early acute appendicitis. No evidence for perforation or abscess formation. 2. Status post recent cholecystectomy. Small amount of fluid within the gallbladder fossa is an expected finding status post recent cholecystectomy. 3. Leiomyomatous uterus. Electronically Signed   By: Franki Cabot M.D.   On: 09/19/2018 08:14    Past Medical History:  Diagnosis Date   Abscess    increased drainage from abscess on buttock   Anal fistula    Anxiety    Bilateral hip  pain 05/27/2015   Chronic pain syndrome 05/27/2015   Depression    sees [name]   Diabetes mellitus without complication [facility])    Hypertension    Sleep apnea    2008- sleep study, neg. for sleep apnea    SVT (supraventricular tachycardia) ([place])    Symptomatic cholelithiasis 09/11/2018    Past Surgical History:  Procedure Laterality Date   ANAL EXAMINATION UNDER ANESTHESIA  02/21/11   anal fistula   BREAST SURGERY  patient does not remember date of procedure   pull fluid off lft br   CHOLECYSTECTOMY N/A 09/12/2018   Procedure: LAPAROSCOPIC CHOLECYSTECTOMY;  Surgeon: Stark Klein, MD;  Location: [facility];  Service: General;  Laterality: N/A;   ELECTROPHYSIOLOGIC STUDY N/A 05/05/2015   Procedure: SVT Ablation;  Surgeon: Will Meredith Leeds, MD;  Location: [facility];  Service: Cardiovascular;  Laterality: N/A;   EP IMPLANTABLE DEVICE N/A 01/30/2016   Procedure: Loop Recorder Insertion;  Surgeon: Sanda Klein, MD;  Location: [facility];  Service: Cardiovascular;  Laterality: N/A;   INCISE AND DRAIN ABCESS     abscess on right thigh and buttock   KNEE ARTHROSCOPY     left   LAPAROSCOPIC APPENDECTOMY N/A 09/19/2018   Procedure: APPENDECTOMY LAPAROSCOPIC;  Surgeon: Clovis Riley, MD;  Location: [facility];  Service: General;  Laterality: N/A;   SHOULDER SURGERY  04/14/09   right    Social History   Socioeconomic History   Marital status: Divorced    Spouse name: Not on file   Number of children: 0   Years of education: Not on file   Highest education level: Not on file  Occupational History   Occupation: [employer]  Financial resource strain: Not on file   Food insecurity    Worry: Not on file    Inability: Not on file   Transportation needs    Medical: Not on file    Non-medical: Not on file  Tobacco Use   Smoking status: Never Smoker   Smokeless tobacco: Never Used  Substance and Sexual Activity   Alcohol use:  No   Drug use: No   Sexual activity: Yes  Lifestyle   Physical activity    Days per week: Not on file    Minutes per session: Not on file   Stress: Not on file  Relationships   Social connections    Talks on phone: Not on file    Gets together: Not on file    Attends religious service: Not on file    Active member of club or organization: Not on file    Attends meetings of clubs or organizations: Not on file    Relationship status: Not on file   Intimate partner violence    Fear of current or ex partner: Not on file    Emotionally abused: Not on file    Physically abused: Not on file    Forced sexual activity: Not on file  Other Topics Concern   Not on file  Social History Narrative   Not on file    Family History  Problem Relation Age of Onset   Hypertension Mother    Alzheimer's disease Mother    Diabetes Father    Breast cancer Sister 61   Anesthesia problems Neg Hx    Hypotension Neg Hx    Malignant hyperthermia Neg Hx    Pseudochol deficiency Neg Hx    Colon cancer Neg Hx    Esophageal cancer Neg Hx    Stomach cancer Neg Hx    Rectal cancer Neg Hx    Thyroid disease Neg Hx     Current Facility-Administered Medications  Medication Dose Route Frequency Provider Last Rate Last Dose   0.9 %  sodium chloride infusion  250 mL Intravenous PRN Michael Boston, MD       acetaminophen (TYLENOL) tablet 1,000 mg  1,000 mg Oral TID Michael Boston, MD   1,000 mg at 09/20/18 1608   atorvastatin (LIPITOR) tablet 20 mg  20 mg Oral Ardeen Fillers, MD   20 mg at 09/20/18 2144   bisacodyl (DULCOLAX) suppository 10 mg  10 mg Rectal Daily PRN Romana Juniper A, MD       carvedilol (COREG) tablet 3.125 mg  3.125 mg Oral BID Michael Boston, MD   3.125 mg at 09/20/18 2145   cefTRIAXone (ROCEPHIN) 2 g in sodium chloride 0.9 % 100 mL IVPB  2 g Intravenous Q24H Romana Juniper A, MD 200 mL/hr at 09/20/18 0936 2 g at 09/20/18 0936   diclofenac (VOLTAREN) EC  tablet 50 mg  50 mg Oral BID Michael Boston, MD   50 mg at 09/20/18 2145   diphenhydrAMINE (BENADRYL) capsule 25 mg  25 mg Oral Q6H PRN Clovis Riley, MD       Or   diphenhydrAMINE (BENADRYL) injection 25 mg  25 mg Intravenous Q6H PRN Romana Juniper A, MD       docusate sodium (COLACE) capsule 100 mg  100 mg Oral BID Romana Juniper A, MD   100 mg at 09/20/18 0930   DULoxetine (CYMBALTA) DR capsule 60 mg  60 mg Oral BID Michael Boston, MD   60 mg at 09/20/18 2143  enoxaparin (LOVENOX) injection 40 mg  40 mg Subcutaneous Q24H Romana Juniper A, MD   40 mg at 09/20/18 0933   furosemide (LASIX) tablet 80 mg  80 mg Oral Daily Michael Boston, MD   80 mg at 09/20/18 0930   gabapentin (NEURONTIN) capsule 300 mg  300 mg Oral BID Michael Boston, MD   300 mg at 09/20/18 2143   hydrALAZINE (APRESOLINE) injection 10 mg  10 mg Intravenous Q2H PRN Clovis Riley, MD       HYDROcodone-acetaminophen (NORCO) 10-325 MG per tablet 1-2 tablet  1-2 tablet Oral Q6H PRN Michael Boston, MD   1 tablet at 09/21/18 0051   HYDROmorphone (DILAUDID) injection 0.5-1 mg  0.5-1 mg Intravenous Q2H PRN Romana Juniper A, MD   1 mg at 09/20/18 1437   insulin aspart (novoLOG) injection 0-20 Units  0-20 Units Subcutaneous TID WC Michael Boston, MD   4 Units at 09/20/18 1800   insulin aspart (novoLOG) injection 0-5 Units  0-5 Units Subcutaneous QHS Michael Boston, MD       insulin glargine (LANTUS) injection 140 Units  140 Units Subcutaneous Daily Michael Boston, MD   140 Units at 09/20/18 1035   LORazepam (ATIVAN) tablet 0.5 mg  0.5 mg Oral BID PRN Michael Boston, MD       methocarbamol (ROBAXIN) 1,000 mg in dextrose 5 % 50 mL IVPB  1,000 mg Intravenous Q6H PRN Michael Boston, MD       methocarbamol (ROBAXIN) tablet 1,000 mg  1,000 mg Oral Q6H PRN Michael Boston, MD   1,000 mg at 09/21/18 0603   metroNIDAZOLE (FLAGYL) tablet 500 mg  500 mg Oral Q8H Georganna Skeans, MD       nortriptyline (PAMELOR) capsule 25 mg  25 mg  Oral Ardeen Fillers, MD   25 mg at 09/20/18 2145   ondansetron (ZOFRAN-ODT) disintegrating tablet 4 mg  4 mg Oral Q6H PRN Clovis Riley, MD       Or   ondansetron [facility]) injection 4 mg  4 mg Intravenous Q6H PRN Romana Juniper A, MD       oxyCODONE (Oxy IR/ROXICODONE) immediate release tablet 5-10 mg  5-10 mg Oral Q4H PRN Romana Juniper A, MD   10 mg at 09/21/18 0604   pantoprazole (PROTONIX) EC tablet 40 mg  40 mg Oral Daily Michael Boston, MD   40 mg at 09/20/18 0931   polyethylene glycol (MIRALAX / GLYCOLAX) packet 17 g  17 g Oral Daily Romana Juniper A, MD   17 g at 09/20/18 0932   simethicone (MYLICON) chewable tablet 40 mg  40 mg Oral Q6H PRN Romana Juniper A, MD       sodium chloride flush (NS) 0.9 % injection 3 mL  3 mL Intravenous Gorden Harms, MD       sodium chloride flush (NS) 0.9 % injection 3 mL  3 mL Intravenous PRN Michael Boston, MD         Allergies  Allergen Reactions   Tramadol Nausea Only    Signed: Morton Peters, MD, FACS, MASCRS [facility]    [address] [place], [place] [address] [phone] Main / Paging [phone] Fax   09/21/2018, 8:01 AM

## 2018-09-22 ENCOUNTER — Telehealth: Payer: Self-pay

## 2018-09-22 NOTE — Telephone Encounter (Signed)
Patient called stating she has been to the hospital twice in the past week for gallbladder and appendix removal. She was given Oxycodone and muscle relaxer. She wants to know how she is suppose to take medication since she is already taking Hydrocodone. I told patient take what she was given by surgeon and then resume Hydrocodone after finished not to take Oxycodone and Hydrocodone at the same time.

## 2018-10-06 ENCOUNTER — Ambulatory Visit (INDEPENDENT_AMBULATORY_CARE_PROVIDER_SITE_OTHER): Payer: BC Managed Care – PPO | Admitting: *Deleted

## 2018-10-06 DIAGNOSIS — R55 Syncope and collapse: Secondary | ICD-10-CM

## 2018-10-06 LAB — CUP PACEART REMOTE DEVICE CHECK
Date Time Interrogation Session: 20200726184057
Implantable Pulse Generator Implant Date: 20171120

## 2018-10-07 ENCOUNTER — Other Ambulatory Visit: Payer: Self-pay | Admitting: Medical

## 2018-10-07 ENCOUNTER — Other Ambulatory Visit: Payer: Self-pay | Admitting: Physical Medicine & Rehabilitation

## 2018-10-07 DIAGNOSIS — G894 Chronic pain syndrome: Secondary | ICD-10-CM

## 2018-10-07 NOTE — Telephone Encounter (Signed)
Is this ok to refill?  

## 2018-10-13 ENCOUNTER — Other Ambulatory Visit: Payer: Self-pay | Admitting: Physical Medicine & Rehabilitation

## 2018-10-13 DIAGNOSIS — Z79899 Other long term (current) drug therapy: Secondary | ICD-10-CM

## 2018-10-13 DIAGNOSIS — Z5181 Encounter for therapeutic drug level monitoring: Secondary | ICD-10-CM

## 2018-10-13 DIAGNOSIS — M160 Bilateral primary osteoarthritis of hip: Secondary | ICD-10-CM

## 2018-10-13 DIAGNOSIS — G894 Chronic pain syndrome: Secondary | ICD-10-CM

## 2018-10-17 ENCOUNTER — Ambulatory Visit: Payer: BC Managed Care – PPO | Admitting: Medical

## 2018-10-20 ENCOUNTER — Ambulatory Visit (INDEPENDENT_AMBULATORY_CARE_PROVIDER_SITE_OTHER): Payer: BC Managed Care – PPO | Admitting: Family Medicine

## 2018-10-20 ENCOUNTER — Encounter: Payer: Self-pay | Admitting: Family Medicine

## 2018-10-20 ENCOUNTER — Other Ambulatory Visit: Payer: Self-pay

## 2018-10-20 VITALS — Temp 99.1°F | Ht 63.0 in | Wt 248.0 lb

## 2018-10-20 DIAGNOSIS — L304 Erythema intertrigo: Secondary | ICD-10-CM | POA: Diagnosis not present

## 2018-10-20 MED ORDER — NYSTATIN-TRIAMCINOLONE 100000-0.1 UNIT/GM-% EX OINT: 1.0000 "application " | TOPICAL_OINTMENT | Freq: Two times a day (BID) | CUTANEOUS | 0 refills | Status: DC

## 2018-10-20 NOTE — Patient Instructions (Signed)
Use the cream as prescribed.  Keep the areas under your breasts as dry as possible.   Use a hair dryer to dry the skin, apply the cream and then use white cotton t-shirt or something similar to keep the skin from touching.   If you notice signs of infection, let me know.   When your blood sugars are better controlled, this will help.     Intertrigo Intertrigo is skin irritation or inflammation (dermatitis) that occurs when folds of skin rub together. The irritation can cause a rash and make skin raw and itchy. This condition most commonly occurs in the skin folds of these areas:  Toes.  Armpits.  Groin.  Under the belly.  Under the breasts.  Buttocks. Intertrigo is not passed from person to person (is not contagious). What are the causes? This condition is caused by heat, moisture, rubbing (friction), and not enough air circulation. The condition can be made worse by:  Sweat.  Bacteria.  A fungus, such as yeast. What increases the risk? This condition is more likely to occur if you have moisture in your skin folds. You are more likely to develop this condition if you:  Have diabetes.  Are overweight.  Are not able to move around or are not active.  Live in a warm and moist climate.  Wear splints, braces, or other medical devices.  Are not able to control your bowels or bladder (have incontinence). What are the signs or symptoms? Symptoms of this condition include:  A pink or red skin rash in the skin fold or near the skin fold.  Raw or scaly skin.  Itchiness.  A burning feeling.  Bleeding.  Leaking fluid.  A bad smell. How is this diagnosed? This condition is diagnosed with a medical history and physical exam. You may also have a skin swab to test for bacteria or a fungus. How is this treated? This condition may be treated by:  Cleaning and drying your skin.  Taking an antibiotic medicine or using an antibiotic skin cream for a bacterial  infection.  Using an antifungal cream on your skin or taking pills for an infection that was caused by a fungus, such as yeast.  Using a steroid ointment to relieve itchiness and irritation.  Separating the skin fold with a clean cotton cloth to absorb moisture and allow air to flow into the area. Follow these instructions at home:  Keep the affected area clean and dry.  Do not scratch your skin.  Stay in a cool environment as much as possible. Use an air conditioner or fan, if available.  Apply over-the-counter and prescription medicines only as told by your health care provider.  If you were prescribed an antibiotic medicine, use it as told by your health care provider. Do not stop using the antibiotic even if your condition improves.  Keep all follow-up visits as told by your health care provider. This is important. How is this prevented?   Maintain a healthy weight.  Take care of your feet, especially if you have diabetes. Foot care includes: ? Wearing shoes that fit well. ? Keeping your feet dry. ? Wearing clean, breathable socks.  Protect the skin around your groin and buttocks, especially if you have incontinence. Skin protection includes: ? Following a regular cleaning routine. ? Using skin protectant creams, powders, or ointments. ? Changing protection pads frequently.  Do not wear tight clothes. Wear clothes that are loose, absorbent, and made of cotton.  Wear a bra that  gives good support, if needed.  Shower and dry yourself well after activity or exercise. Use a hair dryer on a cool setting to dry between skin folds, especially after you bathe.  If you have diabetes, keep your blood sugar under control. Contact a health care provider if:  Your symptoms do not improve with treatment.  Your symptoms get worse or they spread.  You notice increased redness and warmth.  You have a fever. Summary  Intertrigo is skin irritation or inflammation (dermatitis)  that occurs when folds of skin rub together.  This condition is caused by heat, moisture, rubbing (friction), and not enough air circulation.  This condition may be treated by cleaning and drying your skin and with medicines.  Apply over-the-counter and prescription medicines only as told by your health care provider.  Keep all follow-up visits as told by your health care provider. This is important. This information is not intended to replace advice given to you by your health care provider. Make sure you discuss any questions you have with your health care provider. Document Released: 02/26/2005 Document Revised: 07/29/2017 Document Reviewed: 07/29/2017 Elsevier Patient Education  2020 [employer].

## 2018-10-20 NOTE — Progress Notes (Signed)
   Subjective:   Documentation for virtual audio and video telecommunications through [place] encounter  The patient was located at home. 2 identifiers used.  The provider was located in the office. The patient did consent to this visit and is aware of possible charges through their insurance for this visit.  The other persons participating in this telemedicine service were none.    Patient ID: [name], female    DOB: 18-Jul-1960, 58 y.o.   MRN: [id number]  HPI Chief Complaint  Patient presents with  . Rash    under bilateral breast x1 month off and on    Complains of a 1 month history of intermittent red, burning and pruritic rash under her breasts.  States she had this last fall and it was actually worse then. She used nystatin cream at that time.   States she does not have any signs of infection,. No induration, drainage or surrounding erythema.  Denies fever, chills, body aches, chest pain, palpitations,   Had emergency surgery for gallbladder and appendix, 2 different surgeries. Last one was September 18 2018. Doing well now.  Blood sugars have been elevated since surgeries but improving.   Reviewed allergies, medications, past medical, surgical, family, and social history.   Review of Systems Pertinent positives and negatives in the history of present illness.     Objective:   Physical Exam Constitutional:      General: She is not in acute distress.    Appearance: She is not ill-appearing.  Skin:    Comments: Erythema to skin folds beneath breasts. No sign of exudate. Visualized via Doximity  Neurological:     Mental Status: She is alert.    Temp 99.1 F (37.3 C)   Ht 5\' 3"  (1.6 m)   Wt 248 lb (112.5 kg)   LMP 02/09/2017   BMI 43.93 kg/m         Assessment & Plan:  Erythema intertrigo - Plan: nystatin-triamcinolone ointment (MYCOLOG), no acute distress. Treat with Mycolog. Discussed various methods to keep the area dry. Follow up if worsening or  not improving.    Time spent on call was 12 minutes and in review of previous records 4 minutes total.  This virtual service is not related to other E/M service within previous 7 days.

## 2018-10-24 NOTE — Progress Notes (Signed)
[facility] Summary Report / Loop Recorder 

## 2018-10-28 ENCOUNTER — Ambulatory Visit
Admission: RE | Admit: 2018-10-28 | Discharge: 2018-10-28 | Disposition: A | Discharge status: Home / Self Care | Payer: BC Managed Care – PPO | Source: Ambulatory Visit | Attending: Endocrinology | Admitting: Endocrinology

## 2018-10-28 ENCOUNTER — Telehealth: Payer: Self-pay | Admitting: *Deleted

## 2018-10-28 ENCOUNTER — Ambulatory Visit
Admission: RE | Admit: 2018-10-28 | Discharge: 2018-10-28 | Disposition: A | Discharge status: Home / Self Care | Payer: BC Managed Care – PPO | Source: Ambulatory Visit | Attending: Medical | Admitting: Medical

## 2018-10-28 ENCOUNTER — Other Ambulatory Visit: Payer: Self-pay

## 2018-10-28 DIAGNOSIS — Z1231 Encounter for screening mammogram for malignant neoplasm of breast: Secondary | ICD-10-CM

## 2018-10-28 DIAGNOSIS — E042 Nontoxic multinodular goiter: Secondary | ICD-10-CM

## 2018-10-28 NOTE — Telephone Encounter (Signed)
Received LINQ alert for AF episode on 10/27/18 at 22:55, duration 42min. ECG shows disorganized atrial arrhythmia. LINQ implanted for syncope. Routed to Dr. Sallyanne Kuster for review and recommendations.

## 2018-10-29 NOTE — Telephone Encounter (Signed)
Short episode of afib - would continue to monitor - has history of palpitations, not on anticoagultion.  Dr. Lemmie Evens (covering for Dr. Loletha Grayer)

## 2018-11-03 ENCOUNTER — Other Ambulatory Visit: Payer: Self-pay | Admitting: Cardiovascular Disease

## 2018-11-03 ENCOUNTER — Other Ambulatory Visit: Payer: Self-pay | Admitting: Medical

## 2018-11-03 DIAGNOSIS — R609 Edema, unspecified: Secondary | ICD-10-CM

## 2018-11-03 DIAGNOSIS — I471 Supraventricular tachycardia: Secondary | ICD-10-CM

## 2018-11-03 NOTE — Telephone Encounter (Signed)
[facility] pt Cymbalta. Please advise [facility]  °

## 2018-11-04 ENCOUNTER — Encounter: Payer: BC Managed Care – PPO | Attending: Physical Medicine & Rehabilitation | Admitting: Registered Nurse

## 2018-11-04 ENCOUNTER — Other Ambulatory Visit: Payer: Self-pay

## 2018-11-04 VITALS — BP 153/77 | HR 100 | Temp 98.9°F | Resp 16 | Ht 63.0 in | Wt 263.4 lb

## 2018-11-04 DIAGNOSIS — M7062 Trochanteric bursitis, left hip: Secondary | ICD-10-CM

## 2018-11-04 DIAGNOSIS — M25551 Pain in right hip: Secondary | ICD-10-CM | POA: Insufficient documentation

## 2018-11-04 DIAGNOSIS — I10 Essential (primary) hypertension: Secondary | ICD-10-CM | POA: Insufficient documentation

## 2018-11-04 DIAGNOSIS — M7061 Trochanteric bursitis, right hip: Secondary | ICD-10-CM

## 2018-11-04 DIAGNOSIS — E119 Type 2 diabetes mellitus without complications: Secondary | ICD-10-CM | POA: Insufficient documentation

## 2018-11-04 DIAGNOSIS — Z809 Family history of malignant neoplasm, unspecified: Secondary | ICD-10-CM | POA: Insufficient documentation

## 2018-11-04 DIAGNOSIS — G8929 Other chronic pain: Secondary | ICD-10-CM | POA: Diagnosis present

## 2018-11-04 DIAGNOSIS — G4730 Sleep apnea, unspecified: Secondary | ICD-10-CM | POA: Insufficient documentation

## 2018-11-04 DIAGNOSIS — F418 Other specified anxiety disorders: Secondary | ICD-10-CM | POA: Insufficient documentation

## 2018-11-04 DIAGNOSIS — Z833 Family history of diabetes mellitus: Secondary | ICD-10-CM | POA: Diagnosis not present

## 2018-11-04 DIAGNOSIS — M545 Low back pain, unspecified: Secondary | ICD-10-CM

## 2018-11-04 DIAGNOSIS — M25552 Pain in left hip: Secondary | ICD-10-CM | POA: Insufficient documentation

## 2018-11-04 DIAGNOSIS — Z8249 Family history of ischemic heart disease and other diseases of the circulatory system: Secondary | ICD-10-CM | POA: Insufficient documentation

## 2018-11-04 DIAGNOSIS — G894 Chronic pain syndrome: Secondary | ICD-10-CM | POA: Diagnosis not present

## 2018-11-04 DIAGNOSIS — I471 Supraventricular tachycardia: Secondary | ICD-10-CM | POA: Insufficient documentation

## 2018-11-04 DIAGNOSIS — Z5181 Encounter for therapeutic drug level monitoring: Secondary | ICD-10-CM

## 2018-11-04 DIAGNOSIS — Z79899 Other long term (current) drug therapy: Secondary | ICD-10-CM

## 2018-11-04 MED ORDER — HYDROCODONE-ACETAMINOPHEN 10-325 MG PO TABS: 1.0000 | ORAL_TABLET | Freq: Four times a day (QID) | ORAL | 0 refills | Status: DC | PRN

## 2018-11-04 NOTE — Progress Notes (Signed)
Subjective:    Patient ID: [name], female    DOB: Feb 09, 1961, 58 y.o.   MRN: [id number]  HPI: [name] is a 58 y.o. female who returns for follow up appointment for chronic pain and medication refill. She states her pain is located in her lower back and bilateral hips. She  Rates her pain 8. Her. current exercise regime is walking.  [name] Morphine equivalent is  40.00  MME.  Last Oral Swab was Performed 09/08/2018, it was consistent.   [name] was hospitalized twice last month, on 09/11/2018 and discharged on 09/13/2018 for cholelithiasis, she underwent laparoscopic cholecystectomy on 09/12/2018 by Dr. Barry Dienes.   She was admitted on 09/19/2018 and discharged on 09/21/2018 for acute appendicitis, she underwent appendectomy laparoscopic by Dr. Kae Heller.    Pain Inventory Average Pain 7 Pain Right Now 8 My pain is sharp, dull, tingling and aching  In the last 24 hours, has pain interfered with the following? General activity 9 Relation with others 10 Enjoyment of life 10 What TIME of day is your pain at its worst? evening Sleep (in general) Fair  Pain is worse with: bending, standing and some activites Pain improves with: rest, heat/ice and medication Relief from Meds: 7  Mobility how many minutes can you walk? 30 ability to climb steps?  yes do you drive?  yes Do you have any goals in this area?  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 I need assistance with the following:  meal prep, household duties and shopping Do you have any goals in this area?  yes  Neuro/Psych tingling trouble walking  Prior Studies Any changes since last visit?  no  Physicians involved in your care Any changes since last visit?  no   Family History  Problem Relation Age of Onset  . Hypertension Mother   . Alzheimer's disease Mother   . Diabetes Father   . Breast cancer Sister 47  . Anesthesia problems Neg Hx   .  Hypotension Neg Hx   . Malignant hyperthermia Neg Hx   . Pseudochol deficiency Neg Hx   . Colon cancer Neg Hx   . Esophageal cancer Neg Hx   . Stomach cancer Neg Hx   . Rectal cancer Neg Hx   . Thyroid disease Neg Hx    Social History   Socioeconomic History  . Marital status: Divorced    Spouse name: Not on file  . Number of children: 0  . Years of education: Not on file  . Highest education level: Not on file  Occupational History  . Occupation: TEACHER ASSISTANT  Social Needs  . Financial resource strain: Not on file  . Food insecurity    Worry: Not on file    Inability: Not on file  . Transportation needs    Medical: Not on file    Non-medical: Not on file  Tobacco Use  . Smoking status: Never Smoker  . Smokeless tobacco: Never Used  Substance and Sexual Activity  . Alcohol use: No  . Drug use: No  . Sexual activity: Yes  Lifestyle  . Physical activity    Days per week: Not on file    Minutes per session: Not on file  . Stress: Not on file  Relationships  . Social Herbalist on phone: Not on file    Gets together: Not on file    Attends religious service: Not on file  Active member of club or organization: Not on file    Attends meetings of clubs or organizations: Not on file    Relationship status: Not on file  Other Topics Concern  . Not on file  Social History Narrative  . Not on file   Past Surgical History:  Procedure Laterality Date  . ANAL EXAMINATION UNDER ANESTHESIA  02/21/11   anal fistula  . BREAST SURGERY  patient does not remember date of procedure   pull fluid off lft br  . CHOLECYSTECTOMY N/A 09/12/2018   Procedure: LAPAROSCOPIC CHOLECYSTECTOMY;  Surgeon: Stark Klein, MD;  Location: [facility];  Service: General;  Laterality: N/A;  . ELECTROPHYSIOLOGIC STUDY N/A 05/05/2015   Procedure: SVT Ablation;  Surgeon: Will Meredith Leeds, MD;  Location: [facility];  Service: Cardiovascular;  Laterality: N/A;  . EP IMPLANTABLE  DEVICE N/A 01/30/2016   Procedure: Loop Recorder Insertion;  Surgeon: Sanda Klein, MD;  Location: [facility];  Service: Cardiovascular;  Laterality: N/A;  . INCISE AND DRAIN ABCESS     abscess on right thigh and buttock  . KNEE ARTHROSCOPY     left  . LAPAROSCOPIC APPENDECTOMY N/A 09/19/2018   Procedure: APPENDECTOMY LAPAROSCOPIC;  Surgeon: Clovis Riley, MD;  Location: [facility];  Service: General;  Laterality: N/A;  . SHOULDER SURGERY  04/14/09   right   Past Medical History:  Diagnosis Date  . Abscess    increased drainage from abscess on buttock  . Anal fistula   . Anxiety   . Bilateral hip pain 05/27/2015  . Chronic pain syndrome 05/27/2015  . Depression    sees [name]  . Diabetes mellitus without complication ([place])   . Hypertension   . Sleep apnea    2008- sleep study, neg. for sleep apnea   . SVT (supraventricular tachycardia) ([place])   . Symptomatic cholelithiasis 09/11/2018   LMP 02/09/2017   Opioid Risk Score:   Fall Risk Score:  `1  Depression screen PHQ 2/9  Depression screen [facility] 2/9 10/28/2017 10/02/2017 07/15/2017 05/23/2017 04/12/2017 03/21/2017 12/24/2016  Decreased Interest 0 0 0 1 3 0 1  Down, Depressed, Hopeless 0 3 0 1 2 0 1  PHQ - 2 Score 0 3 0 2 5 0 2  Altered sleeping - 1 - 0 3 - -  Tired, decreased energy - 0 - 1 3 - -  Change in appetite - 1 - 0 2 - -  Feeling bad or failure about yourself  - 0 - 0 1 - -  Trouble concentrating - 0 - 0 1 - -  Moving slowly or fidgety/restless - 0 - 0 0 - -  Suicidal thoughts - 0 - 0 0 - -  PHQ-9 Score - 5 - 3 15 - -  Difficult doing work/chores - Somewhat difficult - - - - -  Some recent data might be hidden     Review of Systems  Constitutional: Negative.   HENT: Negative.   Eyes: Negative.   Respiratory: Negative.   Cardiovascular: Negative.   Gastrointestinal: Negative.   Endocrine: Negative.   Genitourinary: Negative.   Musculoskeletal: Positive for back pain, gait problem and myalgias.  Skin:  Negative.   Hematological: Negative.   Psychiatric/Behavioral: Negative.   All other systems reviewed and are negative.      Objective:   Physical Exam Vitals signs and nursing note reviewed.  Constitutional:      Appearance: Normal appearance.  Neck:     Musculoskeletal: Normal  range of motion and neck supple.  Cardiovascular:     Rate and Rhythm: Normal rate and regular rhythm.     Pulses: Normal pulses.     Heart sounds: Normal heart sounds.  Pulmonary:     Effort: Pulmonary effort is normal.     Breath sounds: Normal breath sounds.  Musculoskeletal:     Comments: Normal Muscle Bulk and Muscle Testing Reveals:  Upper Extremities: Full ROM and Muscle Strength 5/5  Lumbar Paraspinal Tenderness: L-4-L-5 Bilateral Greater Trochanter Tenderness  Lower Extremities: Full ROM and Muscle Strength 5/5 Arises from Table with ease Narrow Based Gait   Skin:    General: Skin is warm and dry.  Neurological:     Mental Status: She is alert and oriented to person, place, and time.  Psychiatric:        Mood and Affect: Mood normal.        Behavior: Behavior normal.           Assessment & Plan:  1.Chronic BilateralHip pain L>R---endstage OA of hips..Continue HEP as tolerated and Heat and Ice Therapy. Refilled:Hydrocodone 10/325 one q6 hours as needed for moderate pain #120,11/04/2018. Second script e-scribed for the following month. We will continue the opioid monitoring program, this consists of regular clinic visits, examinations, urine drug screen, pill counts as well as use of [place] Controlled Substance Reporting System. 2. Lumbar Radiculitis:No complaints today. Continue medication regimen withPamelor. Continue Cymbalta and Continue HEP as Tolerated. Continue to Monitor.11/04/2018- 3. Morbid obesity: Continue with healthy diet Regime and HEP. Encouraged to continue with healthy diet regime andHEP.11/04/2018 4. Bilateral Greater  Trochanteric Bursitis:.Continue with Heat and Ice Therapy. Continue to Monitor.11/04/2018 5. Left Knee Pain:No complaints today.Continue with HEP as Tolerated. Continue to Monitor.11/04/2018  15  minutes of face to face patient care time was spent during this visit. All questions were encouraged and answered.  F/U in55months

## 2018-11-05 ENCOUNTER — Ambulatory Visit: Payer: BC Managed Care – PPO | Admitting: Cardiovascular Disease

## 2018-11-05 ENCOUNTER — Encounter: Payer: Self-pay | Admitting: Registered Nurse

## 2018-11-07 ENCOUNTER — Ambulatory Visit (INDEPENDENT_AMBULATORY_CARE_PROVIDER_SITE_OTHER): Payer: BC Managed Care – PPO | Admitting: *Deleted

## 2018-11-07 DIAGNOSIS — R55 Syncope and collapse: Secondary | ICD-10-CM

## 2018-11-07 LAB — CUP PACEART REMOTE DEVICE CHECK
Date Time Interrogation Session: 20200828162327
Implantable Pulse Generator Implant Date: 20171120

## 2018-11-12 NOTE — Progress Notes (Signed)
[facility] Summary Report / Loop Recorder 

## 2018-11-14 ENCOUNTER — Other Ambulatory Visit: Payer: Self-pay | Admitting: Medical

## 2018-11-14 ENCOUNTER — Other Ambulatory Visit: Payer: Self-pay | Admitting: Family Medicine

## 2018-11-14 DIAGNOSIS — L304 Erythema intertrigo: Secondary | ICD-10-CM

## 2018-11-15 ENCOUNTER — Other Ambulatory Visit: Payer: Self-pay | Admitting: Medical

## 2018-11-18 ENCOUNTER — Telehealth: Payer: Self-pay | Admitting: Medical

## 2018-11-18 NOTE — Telephone Encounter (Signed)
   Pt has been waiting on refills on lorazepam and nystatin-trtamcinolone cream since last week   Pharmacy states they have not received response

## 2018-11-18 NOTE — Telephone Encounter (Signed)
Is this ok to refill?  

## 2018-11-19 ENCOUNTER — Other Ambulatory Visit: Payer: Self-pay

## 2018-11-19 ENCOUNTER — Other Ambulatory Visit: Payer: Self-pay | Admitting: Medical

## 2018-11-19 DIAGNOSIS — Z79899 Other long term (current) drug therapy: Secondary | ICD-10-CM

## 2018-11-19 DIAGNOSIS — G894 Chronic pain syndrome: Secondary | ICD-10-CM

## 2018-11-19 DIAGNOSIS — M160 Bilateral primary osteoarthritis of hip: Secondary | ICD-10-CM

## 2018-11-19 DIAGNOSIS — Z5181 Encounter for therapeutic drug level monitoring: Secondary | ICD-10-CM

## 2018-11-19 MED ORDER — LORAZEPAM 0.5 MG PO TABS: 0.5000 mg | ORAL_TABLET | Freq: Two times a day (BID) | ORAL | 0 refills | Status: DC | PRN

## 2018-11-19 MED ORDER — DICLOFENAC SODIUM 50 MG PO TBEC: 50.0000 mg | DELAYED_RELEASE_TABLET | Freq: Two times a day (BID) | ORAL | 1 refills | Status: DC

## 2018-11-29 ENCOUNTER — Other Ambulatory Visit: Payer: Self-pay

## 2018-11-29 DIAGNOSIS — I471 Supraventricular tachycardia: Secondary | ICD-10-CM

## 2018-11-29 DIAGNOSIS — R609 Edema, unspecified: Secondary | ICD-10-CM

## 2018-12-01 MED ORDER — DULOXETINE HCL 60 MG PO CPEP: 60.0000 mg | ORAL_CAPSULE | Freq: Two times a day (BID) | ORAL | 0 refills | Status: DC

## 2018-12-01 MED ORDER — ATORVASTATIN CALCIUM 20 MG PO TABS: 20.0000 mg | ORAL_TABLET | Freq: Every day | ORAL | 0 refills | Status: DC

## 2018-12-01 MED ORDER — FUROSEMIDE 80 MG PO TABS: 80.0000 mg | ORAL_TABLET | Freq: Every day | ORAL | 0 refills | Status: DC

## 2018-12-09 ENCOUNTER — Encounter: Payer: Self-pay | Admitting: Family Medicine

## 2018-12-09 ENCOUNTER — Other Ambulatory Visit: Payer: Self-pay

## 2018-12-09 ENCOUNTER — Ambulatory Visit: Payer: BC Managed Care – PPO | Admitting: Family Medicine

## 2018-12-09 VITALS — BP 150/80 | HR 95 | Temp 97.1°F | Wt 265.0 lb

## 2018-12-09 DIAGNOSIS — K1379 Other lesions of oral mucosa: Secondary | ICD-10-CM

## 2018-12-09 NOTE — Progress Notes (Signed)
   Subjective:    Patient ID: [name], female    DOB: [birth year], 58 y.o.   MRN: [id number]  HPI She complains of a one-week history of lip, tongue and hard palate burning sensation.  No true pain, trouble swallowing, foul odor.  She is on no new medications.  She has not noted burning or tingling in any other area of her body.   Review of Systems     Objective:   Physical Exam Alert and in no distress.  Exam of the mouth including tongue, soft and hard palate, lips is all completely normal.       Assessment & Plan:  Mouth sore Recommend half water and half peroxide several times per day and then spit it out.  If continued difficulty, have her check with her dentist.  She was comfortable with that.

## 2018-12-09 NOTE — Patient Instructions (Signed)
Use half water and half peroxide to rinse your mouth out several times per day and spit it out.  Also use a probiotic.  If you keep having trouble see your dentist

## 2018-12-10 ENCOUNTER — Ambulatory Visit (INDEPENDENT_AMBULATORY_CARE_PROVIDER_SITE_OTHER): Payer: BC Managed Care – PPO | Admitting: *Deleted

## 2018-12-10 DIAGNOSIS — R55 Syncope and collapse: Secondary | ICD-10-CM | POA: Diagnosis not present

## 2018-12-10 LAB — CUP PACEART REMOTE DEVICE CHECK
Date Time Interrogation Session: 20200930183019
Implantable Pulse Generator Implant Date: 20171120

## 2018-12-17 NOTE — Progress Notes (Signed)
[facility] Summary Report / Loop Recorder 

## 2018-12-20 ENCOUNTER — Other Ambulatory Visit: Payer: Self-pay

## 2018-12-20 ENCOUNTER — Other Ambulatory Visit: Payer: Self-pay | Admitting: Medical

## 2018-12-20 ENCOUNTER — Other Ambulatory Visit: Payer: Self-pay | Admitting: Cardiovascular Disease

## 2018-12-22 ENCOUNTER — Other Ambulatory Visit: Payer: Self-pay

## 2018-12-22 MED ORDER — ATORVASTATIN CALCIUM 20 MG PO TABS: 20.0000 mg | ORAL_TABLET | Freq: Every day | ORAL | 0 refills | Status: DC

## 2018-12-22 NOTE — Telephone Encounter (Signed)
[facility] to fill pt lorazepam. Please advise [facility]

## 2018-12-22 NOTE — Telephone Encounter (Signed)
Rx(s) sent to pharmacy electronically.  

## 2018-12-23 ENCOUNTER — Other Ambulatory Visit: Payer: Self-pay | Admitting: Medical

## 2018-12-23 ENCOUNTER — Telehealth: Payer: Self-pay | Admitting: Medical

## 2018-12-23 ENCOUNTER — Other Ambulatory Visit: Payer: Self-pay

## 2018-12-23 NOTE — Telephone Encounter (Signed)
I received refill request on anxiety medication.  I would like some correspondence from her counselor.  I believe she sees a Theme park manager for counseling.  Nevertheless, I would like the most recent 3 office notes from their encounters.  She will need to make sure she signs HIPPA on our and their end.    I want to make sure she is pursuing counseling and not just relying on medication which is not really a long term solution.\  Go ahead and set up anxiety follow up appt soon

## 2018-12-24 ENCOUNTER — Telehealth: Payer: Self-pay | Admitting: Medical

## 2018-12-24 ENCOUNTER — Other Ambulatory Visit: Payer: Self-pay | Admitting: Medical

## 2018-12-24 MED ORDER — LORAZEPAM 0.5 MG PO TABS: 0.5000 mg | ORAL_TABLET | Freq: Two times a day (BID) | ORAL | 0 refills | Status: DC | PRN

## 2018-12-24 NOTE — Telephone Encounter (Signed)
I refilled her lorazepam.  Ultimately I do not think this is a great medication to be on for anxiety every single day.  We have been corresponding by email the last few days.  I would recommend we try to refer her to psychiatry including counseling in the same office.  Generally she has to make this appointment for herself.  In addition to seeing her pastor I would recommend this step.  So please give her contact information below so she can make an appointment to help improve on her anxiety care     Psychiatry and Counseling services  [facility] [address], [place], [place] [address] [phone]  Lina Sayre, therapist Dr. Lynder Parents, psychiatrist Dr. Milana Huntsman, child psychiatrist   Dr. Launa Flight [address], [place], [place] [address] [phone]   Dr. Chucky May, psychiatry [address] [place], [place] [address] [phone]   [facility] [place], [place], [place] [address] [phone]    [facility] [place], [place], [place] [address] [phone]

## 2018-12-24 NOTE — Telephone Encounter (Signed)
Patient stated she will call her pastor about the notes and she will call back to schedule

## 2018-12-25 NOTE — Telephone Encounter (Signed)
Information has been sent in [place] and has been mailed.

## 2018-12-31 ENCOUNTER — Encounter: Payer: Self-pay | Admitting: Registered Nurse

## 2018-12-31 ENCOUNTER — Encounter: Payer: BC Managed Care – PPO | Attending: Physical Medicine & Rehabilitation | Admitting: Registered Nurse

## 2018-12-31 ENCOUNTER — Other Ambulatory Visit: Payer: Self-pay

## 2018-12-31 VITALS — BP 149/84 | HR 85 | Temp 97.7°F | Ht 63.0 in | Wt 272.0 lb

## 2018-12-31 DIAGNOSIS — G4730 Sleep apnea, unspecified: Secondary | ICD-10-CM | POA: Insufficient documentation

## 2018-12-31 DIAGNOSIS — M7061 Trochanteric bursitis, right hip: Secondary | ICD-10-CM

## 2018-12-31 DIAGNOSIS — M25551 Pain in right hip: Secondary | ICD-10-CM | POA: Diagnosis not present

## 2018-12-31 DIAGNOSIS — Z5181 Encounter for therapeutic drug level monitoring: Secondary | ICD-10-CM

## 2018-12-31 DIAGNOSIS — W19XXXA Unspecified fall, initial encounter: Secondary | ICD-10-CM

## 2018-12-31 DIAGNOSIS — I10 Essential (primary) hypertension: Secondary | ICD-10-CM | POA: Diagnosis not present

## 2018-12-31 DIAGNOSIS — M545 Low back pain, unspecified: Secondary | ICD-10-CM

## 2018-12-31 DIAGNOSIS — Z833 Family history of diabetes mellitus: Secondary | ICD-10-CM | POA: Insufficient documentation

## 2018-12-31 DIAGNOSIS — G8929 Other chronic pain: Secondary | ICD-10-CM | POA: Insufficient documentation

## 2018-12-31 DIAGNOSIS — M25552 Pain in left hip: Secondary | ICD-10-CM | POA: Insufficient documentation

## 2018-12-31 DIAGNOSIS — Z809 Family history of malignant neoplasm, unspecified: Secondary | ICD-10-CM | POA: Insufficient documentation

## 2018-12-31 DIAGNOSIS — G894 Chronic pain syndrome: Secondary | ICD-10-CM | POA: Diagnosis not present

## 2018-12-31 DIAGNOSIS — I471 Supraventricular tachycardia: Secondary | ICD-10-CM | POA: Diagnosis not present

## 2018-12-31 DIAGNOSIS — M778 Other enthesopathies, not elsewhere classified: Secondary | ICD-10-CM

## 2018-12-31 DIAGNOSIS — E119 Type 2 diabetes mellitus without complications: Secondary | ICD-10-CM | POA: Insufficient documentation

## 2018-12-31 DIAGNOSIS — Z8249 Family history of ischemic heart disease and other diseases of the circulatory system: Secondary | ICD-10-CM | POA: Diagnosis not present

## 2018-12-31 DIAGNOSIS — M7062 Trochanteric bursitis, left hip: Secondary | ICD-10-CM

## 2018-12-31 DIAGNOSIS — F418 Other specified anxiety disorders: Secondary | ICD-10-CM | POA: Insufficient documentation

## 2018-12-31 DIAGNOSIS — Z79899 Other long term (current) drug therapy: Secondary | ICD-10-CM

## 2018-12-31 MED ORDER — HYDROCODONE-ACETAMINOPHEN 10-325 MG PO TABS: 1.0000 | ORAL_TABLET | Freq: Four times a day (QID) | ORAL | 0 refills | Status: DC | PRN

## 2018-12-31 NOTE — Progress Notes (Signed)
Subjective:    Patient ID: [name], female    DOB: 1960/04/01, 58 y.o.   MRN: [id number]  HPI: [name] is a 58 y.o. female who returns for follow up appointment for chronic pain and medication refill. She states her pain is located in her right middle finger, lower back and bilateral hips. She rates her pain 7. Her current exercise regime is walking.  [name] reports she had a fall two weeks ago, she was walking down her porch steps and her left leg gave out and she fell on her knees and right hand. She was able to pick herself up, she didn't seek medical attention. Also states she has asked her landlord to put up a hand rail. She was instructed to call her church and community service to obtain assistance with hand rail, she verbalizes understanding. Educated on falls prevention, she verbalizes understanding. Also instructed to purchase a cane she verbalizes understanding.   [name] Morphine equivalent is 40.00 MME. She  is also prescribed Lorazepam  by Dr. Glade Lloyd .We have discussed the black box warning of using opioids and benzodiazepines. I highlighted the dangers of using these drugs together and discussed the adverse events including respiratory suppression, overdose, cognitive impairment and importance of compliance with current regimen. We will continue to monitor and adjust as indicated.     Last Oral Swab was performed on 09/08/2018. It was consistent.    Pain Inventory Average Pain 9 Pain Right Now 7 My pain is sharp, dull, stabbing and aching  In the last 24 hours, has pain interfered with the following? General activity 10 Relation with others 10 Enjoyment of life 10 What TIME of day is your pain at its worst? daytime, evening  Sleep (in general) Poor  Pain is worse with: walking, standing and some activites Pain improves with: rest, heat/ice and medication Relief from Meds: 5  Mobility walk without assistance ability to climb  steps?  yes do you drive?  yes transfers alone Do you have any goals in this area?  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  no  Physicians involved in your care Any changes since last visit?  no   Family History  Problem Relation Age of Onset  . Hypertension Mother   . Alzheimer's disease Mother   . Diabetes Father   . Breast cancer Sister 60  . Anesthesia problems Neg Hx   . Hypotension Neg Hx   . Malignant hyperthermia Neg Hx   . Pseudochol deficiency Neg Hx   . Colon cancer Neg Hx   . Esophageal cancer Neg Hx   . Stomach cancer Neg Hx   . Rectal cancer Neg Hx   . Thyroid disease Neg Hx    Social History   Socioeconomic History  . Marital status: Divorced    Spouse name: Not on file  . Number of children: 0  . Years of education: Not on file  . Highest education level: Not on file  Occupational History  . Occupation: TEACHER ASSISTANT  Social Needs  . Financial resource strain: Not on file  . Food insecurity    Worry: Not on file    Inability: Not on file  . Transportation needs    Medical: Not on file    Non-medical: Not on file  Tobacco Use  . Smoking status: Never Smoker  . Smokeless tobacco: Never Used  Substance and Sexual Activity  . Alcohol use: No  . Drug use: No  . Sexual activity: Yes  Lifestyle  . Physical activity    Days per week: Not on file    Minutes per session: Not on file  . Stress: Not on file  Relationships  . Social Herbalist on phone: Not on file    Gets together: Not on file    Attends religious service: Not on file    Active member of club or organization: Not on file    Attends meetings of clubs or organizations: Not on file    Relationship status: Not on file  Other Topics Concern  . Not on file  Social History  Narrative  . Not on file   Past Surgical History:  Procedure Laterality Date  . ANAL EXAMINATION UNDER ANESTHESIA  02/21/11   anal fistula  . BREAST SURGERY  patient does not remember date of procedure   pull fluid off lft br  . CHOLECYSTECTOMY N/A 09/12/2018   Procedure: LAPAROSCOPIC CHOLECYSTECTOMY;  Surgeon: Stark Klein, MD;  Location: [facility];  Service: General;  Laterality: N/A;  . ELECTROPHYSIOLOGIC STUDY N/A 05/05/2015   Procedure: SVT Ablation;  Surgeon: Will Meredith Leeds, MD;  Location: [facility];  Service: Cardiovascular;  Laterality: N/A;  . EP IMPLANTABLE DEVICE N/A 01/30/2016   Procedure: Loop Recorder Insertion;  Surgeon: Sanda Klein, MD;  Location: [facility];  Service: Cardiovascular;  Laterality: N/A;  . INCISE AND DRAIN ABCESS     abscess on right thigh and buttock  . KNEE ARTHROSCOPY     left  . LAPAROSCOPIC APPENDECTOMY N/A 09/19/2018   Procedure: APPENDECTOMY LAPAROSCOPIC;  Surgeon: Clovis Riley, MD;  Location: [facility];  Service: General;  Laterality: N/A;  . SHOULDER SURGERY  04/14/09   right   Past Medical History:  Diagnosis Date  . Abscess    increased drainage from abscess on buttock  . Anal fistula   . Anxiety   . Bilateral hip pain 05/27/2015  . Chronic pain syndrome 05/27/2015  . Depression    sees [name]  . Diabetes mellitus without complication ([place])   . Hypertension   . Sleep apnea    2008- sleep study, neg. for sleep apnea   . SVT (supraventricular tachycardia) ([place])   . Symptomatic cholelithiasis 09/11/2018   BP (!) 172/88   Pulse 90   Temp 97.7 F (36.5 C)   Ht 5\' 3"  (1.6 m)   Wt 272 lb (123.4 kg)   LMP 02/09/2017   SpO2 96%   BMI 48.18 kg/m   Opioid Risk Score:   Fall Risk Score:  `1  Depression screen PHQ 2/9  Depression screen [facility] 2/9 10/28/2017 10/02/2017 07/15/2017 05/23/2017 04/12/2017 03/21/2017 12/24/2016  Decreased Interest 0 0 0 1 3 0 1  Down, Depressed, Hopeless 0 3 0 1 2 0 1  PHQ - 2 Score 0 3 0 2 5  0 2  Altered sleeping - 1 - 0 3 - -  Tired, decreased energy - 0 - 1 3 - -  Change in appetite - 1 - 0 2 - -  Feeling bad or failure about yourself  - 0 - 0 1 - -  Trouble concentrating - 0 - 0 1 - -  Moving slowly or fidgety/restless - 0 - 0 0 - -  Suicidal thoughts -  0 - 0 0 - -  PHQ-9 Score - 5 - 3 15 - -  Difficult doing work/chores - Somewhat difficult - - - - -  Some recent data might be hidden   . Review of Systems  Constitutional: Negative.   HENT: Negative.   Eyes: Negative.   Respiratory: Negative.   Cardiovascular: Negative.   Gastrointestinal: Negative.   Endocrine: Negative.   Genitourinary: Negative.   Musculoskeletal: Positive for back pain.  Skin: Negative.   Allergic/Immunologic: Negative.   Neurological: Negative.   Hematological: Negative.   Psychiatric/Behavioral: Negative.   All other systems reviewed and are negative.      Objective:   Physical Exam Vitals signs and nursing note reviewed.  Constitutional:      Appearance: Normal appearance.  Neck:     Musculoskeletal: Normal range of motion and neck supple.  Cardiovascular:     Rate and Rhythm: Normal rate and regular rhythm.     Pulses: Normal pulses.     Heart sounds: Normal heart sounds.  Pulmonary:     Effort: Pulmonary effort is normal.     Breath sounds: Normal breath sounds.  Musculoskeletal:     Comments: Normal Muscle Bulk and Muscle Testing Reveals:  Upper Extremities: Full ROM and Muscle Strength 5/5  Lumbar Paraspinal Tenderness: L-4-L-5 Lower Extremities: Decreased ROM and Muscle Strength 5/5  Left Lower Extremity Flexion Produces Pain into Left Patella Arises from table with ease Narrow Based  Gait   Skin:    General: Skin is warm and dry.  Neurological:     Mental Status: She is alert and oriented to person, place, and time.  Psychiatric:        Mood and Affect: Mood normal.        Behavior: Behavior normal.           Assessment & Plan:  1.Chronic BilateralHip  pain L>R---endstage OA of hips..Continue HEP as tolerated and Heat and Ice Therapy. Refilled:Hydrocodone 10/325 one q6 hours as needed for moderate pain #120,12/31/2018. Second script e-scribed for the following month. We will continue the opioid monitoring program, this consists of regular clinic visits, examinations, urine drug screen, pill counts as well as use of [place] Controlled Substance Reporting System. 2. Lumbar Radiculitis:No complaints today. Continue medication regimen withPamelor. Continue Cymbalta and Continue HEP as Tolerated. Continue to Monitor.12/31/2018- 3. Morbid obesity: Continue with healthy diet Regime and HEP. Encouraged to continue with healthy diet regime andHEP.12/31/2018 4. Bilateral Greater Trochanteric Bursitis:.Continue with Heat and Ice Therapy. Continue to Monitor.12/31/2018 5. Left Knee Pain:Continue with HEP as Tolerated. Continue to Monitor.12/31/2018 6. Right middle finger tendonitis: Continue to alternate Ice and Heat Therapy. Continue to Monitor. Call office next week if no relief will schedule cortisone injection, she verbalizes understanding.  7. Falls: Educated on Falls prevention and to purchase a cane, she verbalize understanding.   31minutes of face to face patient care time was spent during this visit. All questions were encouraged and answered.  F/U in85months

## 2019-01-01 ENCOUNTER — Other Ambulatory Visit: Payer: Self-pay

## 2019-01-01 DIAGNOSIS — I471 Supraventricular tachycardia: Secondary | ICD-10-CM

## 2019-01-01 DIAGNOSIS — R609 Edema, unspecified: Secondary | ICD-10-CM

## 2019-01-02 ENCOUNTER — Ambulatory Visit: Payer: BC Managed Care – PPO | Admitting: Registered Nurse

## 2019-01-02 MED ORDER — FUROSEMIDE 80 MG PO TABS: 80.0000 mg | ORAL_TABLET | Freq: Every day | ORAL | 0 refills | Status: DC

## 2019-01-04 ENCOUNTER — Other Ambulatory Visit: Payer: Self-pay | Admitting: Registered Nurse

## 2019-01-04 ENCOUNTER — Other Ambulatory Visit: Payer: Self-pay | Admitting: Medical

## 2019-01-04 DIAGNOSIS — G894 Chronic pain syndrome: Secondary | ICD-10-CM

## 2019-01-12 ENCOUNTER — Ambulatory Visit (INDEPENDENT_AMBULATORY_CARE_PROVIDER_SITE_OTHER): Payer: BC Managed Care – PPO | Admitting: *Deleted

## 2019-01-12 DIAGNOSIS — I471 Supraventricular tachycardia: Secondary | ICD-10-CM | POA: Diagnosis not present

## 2019-01-12 LAB — CUP PACEART REMOTE DEVICE CHECK
Date Time Interrogation Session: 20201102184225
Implantable Pulse Generator Implant Date: 20171120

## 2019-01-18 ENCOUNTER — Other Ambulatory Visit: Payer: Self-pay

## 2019-01-18 ENCOUNTER — Other Ambulatory Visit: Payer: Self-pay | Admitting: Physical Medicine & Rehabilitation

## 2019-01-18 DIAGNOSIS — G894 Chronic pain syndrome: Secondary | ICD-10-CM

## 2019-01-18 DIAGNOSIS — Z5181 Encounter for therapeutic drug level monitoring: Secondary | ICD-10-CM

## 2019-01-18 DIAGNOSIS — Z79899 Other long term (current) drug therapy: Secondary | ICD-10-CM

## 2019-01-18 DIAGNOSIS — M160 Bilateral primary osteoarthritis of hip: Secondary | ICD-10-CM

## 2019-01-19 ENCOUNTER — Ambulatory Visit: Payer: BC Managed Care – PPO | Admitting: Medical

## 2019-01-19 MED ORDER — ATORVASTATIN CALCIUM 20 MG PO TABS: 20.0000 mg | ORAL_TABLET | Freq: Every day | ORAL | 0 refills | Status: DC

## 2019-01-19 NOTE — Telephone Encounter (Signed)
Please advise if okay to refill, patient has not been seen since 06/2017; follow up was suppose to be in 12 months, no appointment scheduled at this time.

## 2019-01-20 ENCOUNTER — Other Ambulatory Visit: Payer: Self-pay | Admitting: Medical

## 2019-01-21 ENCOUNTER — Other Ambulatory Visit: Payer: Self-pay | Admitting: Cardiovascular Disease

## 2019-01-21 MED ORDER — ATORVASTATIN CALCIUM 20 MG PO TABS: 20.0000 mg | ORAL_TABLET | Freq: Every day | ORAL | 1 refills | Status: DC

## 2019-01-21 NOTE — Telephone Encounter (Signed)
°*  STAT* If patient is at the pharmacy, call can be transferred to refill team.   1. Which medications need to be refilled? (please list name of each medication and dose if known) atorvastatin (LIPITOR) 20 MG tablet  2. Which pharmacy/location (including street and city if local pharmacy) is medication to be sent to?  [facility] - [place], [place] - [address]  3. Do they need a 30 day or 90 day supply? 90   

## 2019-01-21 NOTE — Telephone Encounter (Signed)
Rx request sent to pharmacy.  

## 2019-01-29 ENCOUNTER — Ambulatory Visit (INDEPENDENT_AMBULATORY_CARE_PROVIDER_SITE_OTHER): Payer: BC Managed Care – PPO | Admitting: General Practice

## 2019-01-29 ENCOUNTER — Other Ambulatory Visit: Payer: Self-pay

## 2019-01-29 DIAGNOSIS — B379 Candidiasis, unspecified: Secondary | ICD-10-CM

## 2019-01-29 MED ORDER — NYSTATIN 100000 UNIT/GM EX CREA: TOPICAL_CREAM | CUTANEOUS | 0 refills | Status: DC

## 2019-01-29 MED ORDER — FLUCONAZOLE 150 MG PO TABS: ORAL_TABLET | ORAL | 0 refills | Status: DC

## 2019-01-29 NOTE — Progress Notes (Signed)
Patient presents to office today reporting yeast infection for the past 3 weeks. She notes a thick, white, clumpy discharge with itching and red, raw skin on her labia. Patient reports she has had uncontrolled diabetes lately. Discussed with Dr Rip Harbour who recommends Diflucan 150mg  today and repeat in 3 days with Nystatin Rx to be applied on the outside to be used daily for 7 days then PRN. Discussed with patient. Patient verbalized understanding & had no questions.  Koren Bound RN BSN 01/29/19

## 2019-01-30 NOTE — Progress Notes (Signed)
Agree with A & P. 

## 2019-02-01 ENCOUNTER — Other Ambulatory Visit: Payer: Self-pay | Admitting: Cardiovascular Disease

## 2019-02-01 ENCOUNTER — Other Ambulatory Visit: Payer: Self-pay | Admitting: Medical

## 2019-02-01 DIAGNOSIS — R609 Edema, unspecified: Secondary | ICD-10-CM

## 2019-02-01 DIAGNOSIS — I471 Supraventricular tachycardia: Secondary | ICD-10-CM

## 2019-02-02 ENCOUNTER — Other Ambulatory Visit: Payer: Self-pay | Admitting: Medical

## 2019-02-02 MED ORDER — LORAZEPAM 0.5 MG PO TABS: 0.5000 mg | ORAL_TABLET | Freq: Two times a day (BID) | ORAL | 0 refills | Status: DC | PRN

## 2019-02-02 NOTE — Telephone Encounter (Signed)
Is this appropriate?  

## 2019-02-09 NOTE — Progress Notes (Signed)
[facility] Summary Report / Loop Recorder 

## 2019-02-13 ENCOUNTER — Ambulatory Visit (INDEPENDENT_AMBULATORY_CARE_PROVIDER_SITE_OTHER): Payer: BC Managed Care – PPO | Admitting: *Deleted

## 2019-02-13 DIAGNOSIS — I471 Supraventricular tachycardia: Secondary | ICD-10-CM | POA: Diagnosis not present

## 2019-02-16 LAB — CUP PACEART REMOTE DEVICE CHECK
Date Time Interrogation Session: 20201207140322
Implantable Pulse Generator Implant Date: 20171120

## 2019-02-17 ENCOUNTER — Other Ambulatory Visit: Payer: Self-pay | Admitting: Medical

## 2019-03-02 ENCOUNTER — Other Ambulatory Visit: Payer: Self-pay

## 2019-03-02 ENCOUNTER — Encounter: Payer: BC Managed Care – PPO | Attending: Physical Medicine & Rehabilitation | Admitting: Registered Nurse

## 2019-03-02 ENCOUNTER — Encounter: Payer: Self-pay | Admitting: Registered Nurse

## 2019-03-02 VITALS — BP 138/77 | HR 100 | Temp 97.9°F | Ht 63.0 in | Wt 265.0 lb

## 2019-03-02 DIAGNOSIS — I471 Supraventricular tachycardia: Secondary | ICD-10-CM | POA: Diagnosis not present

## 2019-03-02 DIAGNOSIS — M79641 Pain in right hand: Secondary | ICD-10-CM | POA: Insufficient documentation

## 2019-03-02 DIAGNOSIS — G894 Chronic pain syndrome: Secondary | ICD-10-CM | POA: Diagnosis not present

## 2019-03-02 DIAGNOSIS — I10 Essential (primary) hypertension: Secondary | ICD-10-CM | POA: Insufficient documentation

## 2019-03-02 DIAGNOSIS — Z8249 Family history of ischemic heart disease and other diseases of the circulatory system: Secondary | ICD-10-CM | POA: Diagnosis not present

## 2019-03-02 DIAGNOSIS — M25552 Pain in left hip: Secondary | ICD-10-CM | POA: Diagnosis not present

## 2019-03-02 DIAGNOSIS — M25551 Pain in right hip: Secondary | ICD-10-CM | POA: Diagnosis not present

## 2019-03-02 DIAGNOSIS — Z5181 Encounter for therapeutic drug level monitoring: Secondary | ICD-10-CM

## 2019-03-02 DIAGNOSIS — M5416 Radiculopathy, lumbar region: Secondary | ICD-10-CM | POA: Diagnosis not present

## 2019-03-02 DIAGNOSIS — G4730 Sleep apnea, unspecified: Secondary | ICD-10-CM | POA: Diagnosis not present

## 2019-03-02 DIAGNOSIS — G8929 Other chronic pain: Secondary | ICD-10-CM | POA: Insufficient documentation

## 2019-03-02 DIAGNOSIS — E119 Type 2 diabetes mellitus without complications: Secondary | ICD-10-CM | POA: Diagnosis not present

## 2019-03-02 DIAGNOSIS — Z809 Family history of malignant neoplasm, unspecified: Secondary | ICD-10-CM | POA: Insufficient documentation

## 2019-03-02 DIAGNOSIS — F418 Other specified anxiety disorders: Secondary | ICD-10-CM | POA: Insufficient documentation

## 2019-03-02 DIAGNOSIS — M7062 Trochanteric bursitis, left hip: Secondary | ICD-10-CM

## 2019-03-02 DIAGNOSIS — Z833 Family history of diabetes mellitus: Secondary | ICD-10-CM | POA: Diagnosis not present

## 2019-03-02 DIAGNOSIS — Z79899 Other long term (current) drug therapy: Secondary | ICD-10-CM

## 2019-03-02 MED ORDER — HYDROCODONE-ACETAMINOPHEN 10-325 MG PO TABS: 1.0000 | ORAL_TABLET | Freq: Four times a day (QID) | ORAL | 0 refills | Status: DC | PRN

## 2019-03-02 NOTE — Progress Notes (Signed)
Subjective:    Patient ID: [name], female    DOB: 10-07-1960, 58 y.o.   MRN: [id number]  HPI: [name] is a 58 y.o. female who returns for follow up appointment for chronic pain and medication refill. She states her pain is located in her right hand, she denies falling and lower back pain radiating into her bilateral hips and bilateral lower extremities. She rates her pain 6. Her current exercise regime is walking and performing stretching exercises.  Ms. Stelmack Morphine equivalent is 40.00 MME. She  is also prescribed Lorazepam by [name]. We have discussed the black box warning of using opioids and benzodiazepines. I highlighted the dangers of using these drugs together and discussed the adverse events including respiratory suppression, overdose, cognitive impairment and importance of compliance with current regimen. We will continue to monitor and adjust as indicated  Last Oral Swab was Performed on 09/08/2018, it was consistent.   Pain Inventory Average Pain 7 Pain Right Now 6 My pain is sharp, dull, stabbing and aching  In the last 24 hours, has pain interfered with the following? General activity 10 Relation with others 8 Enjoyment of life 10 What TIME of day is your pain at its worst? daytime Sleep (in general) Fair  Pain is worse with: walking, standing and some activites Pain improves with: rest, heat/ice and medication Relief from Meds: 8  Mobility do you drive?  yes  Function employed # of hrs/week . I need assistance with the following:  meal prep, household duties and shopping  Neuro/Psych No problems in this area  Prior Studies Any changes since last visit?  no  Physicians involved in your care Any changes since last visit?  no   Family History  Problem Relation Age of Onset  . Hypertension Mother   . Alzheimer's disease Mother   . Diabetes Father   . Breast cancer Sister 84  . Anesthesia problems Neg Hx   .  Hypotension Neg Hx   . Malignant hyperthermia Neg Hx   . Pseudochol deficiency Neg Hx   . Colon cancer Neg Hx   . Esophageal cancer Neg Hx   . Stomach cancer Neg Hx   . Rectal cancer Neg Hx   . Thyroid disease Neg Hx    Social History   Socioeconomic History  . Marital status: Divorced    Spouse name: Not on file  . Number of children: 0  . Years of education: Not on file  . Highest education level: Not on file  Occupational History  . Occupation: TEACHER ASSISTANT  Tobacco Use  . Smoking status: Never Smoker  . Smokeless tobacco: Never Used  Substance and Sexual Activity  . Alcohol use: No  . Drug use: No  . Sexual activity: Yes  Other Topics Concern  . Not on file  Social History Narrative  . Not on file   Social Determinants of Health   Financial Resource Strain:   . Difficulty of Paying Living Expenses: Not on file  Food Insecurity:   . Worried About Charity fundraiser in the Last Year: Not on file  . Ran Out of Food in the Last Year: Not on file  Transportation Needs:   . Lack of Transportation (Medical): Not on file  . Lack of Transportation (Non-Medical): Not on file  Physical Activity:   . Days of Exercise per Week: Not on file  . Minutes of Exercise per Session: Not on file  Stress:   . Feeling of  Stress : Not on file  Social Connections:   . Frequency of Communication with Friends and Family: Not on file  . Frequency of Social Gatherings with Friends and Family: Not on file  . Attends Religious Services: Not on file  . Active Member of Clubs or Organizations: Not on file  . Attends Archivist Meetings: Not on file  . Marital Status: Not on file   Past Surgical History:  Procedure Laterality Date  . ANAL EXAMINATION UNDER ANESTHESIA  02/21/11   anal fistula  . BREAST SURGERY  patient does not remember date of procedure   pull fluid off lft br  . CHOLECYSTECTOMY N/A 09/12/2018   Procedure: LAPAROSCOPIC CHOLECYSTECTOMY;  Surgeon: Stark Klein, MD;  Location: [facility];  Service: General;  Laterality: N/A;  . ELECTROPHYSIOLOGIC STUDY N/A 05/05/2015   Procedure: SVT Ablation;  Surgeon: Will Meredith Leeds, MD;  Location: [facility];  Service: Cardiovascular;  Laterality: N/A;  . EP IMPLANTABLE DEVICE N/A 01/30/2016   Procedure: Loop Recorder Insertion;  Surgeon: Sanda Klein, MD;  Location: [facility];  Service: Cardiovascular;  Laterality: N/A;  . INCISE AND DRAIN ABCESS     abscess on right thigh and buttock  . KNEE ARTHROSCOPY     left  . LAPAROSCOPIC APPENDECTOMY N/A 09/19/2018   Procedure: APPENDECTOMY LAPAROSCOPIC;  Surgeon: Clovis Riley, MD;  Location: [facility];  Service: General;  Laterality: N/A;  . SHOULDER SURGERY  04/14/09   right   Past Medical History:  Diagnosis Date  . Abscess    increased drainage from abscess on buttock  . Anal fistula   . Anxiety   . Bilateral hip pain 05/27/2015  . Chronic pain syndrome 05/27/2015  . Depression    sees [name]  . Diabetes mellitus without complication ([place])   . Hypertension   . Sleep apnea    2008- sleep study, neg. for sleep apnea   . SVT (supraventricular tachycardia) ([place])   . Symptomatic cholelithiasis 09/11/2018   BP 138/77   Pulse 100   Temp 97.9 F (36.6 C)   Ht 5\' 3"  (1.6 m)   Wt 265 lb (120.2 kg)   LMP 02/09/2017   SpO2 95%   BMI 46.94 kg/m   Opioid Risk Score:   Fall Risk Score:  `1  Depression screen PHQ 2/9  Depression screen [facility] 2/9 10/28/2017 10/02/2017 07/15/2017 05/23/2017 04/12/2017 03/21/2017 12/24/2016  Decreased Interest 0 0 0 1 3 0 1  Down, Depressed, Hopeless 0 3 0 1 2 0 1  PHQ - 2 Score 0 3 0 2 5 0 2  Altered sleeping - 1 - 0 3 - -  Tired, decreased energy - 0 - 1 3 - -  Change in appetite - 1 - 0 2 - -  Feeling bad or failure about yourself  - 0 - 0 1 - -  Trouble concentrating - 0 - 0 1 - -  Moving slowly or fidgety/restless - 0 - 0 0 - -  Suicidal thoughts - 0 - 0 0 - -  PHQ-9 Score - 5 - 3 15 - -  Difficult  doing work/chores - Somewhat difficult - - - - -  Some recent data might be hidden     Review of Systems  Constitutional: Negative.   HENT: Negative.   Eyes: Negative.   Respiratory: Negative.   Cardiovascular: Negative.   Gastrointestinal: Negative.   Endocrine: Negative.   Genitourinary: Negative.   Musculoskeletal: Positive  for arthralgias and back pain.  Skin: Negative.   Allergic/Immunologic: Negative.   Hematological: Negative.   Psychiatric/Behavioral: Negative.   All other systems reviewed and are negative.      Objective:   Physical Exam Vitals and nursing note reviewed.  Constitutional:      Appearance: Normal appearance. She is obese.  Cardiovascular:     Rate and Rhythm: Normal rate and regular rhythm.     Pulses: Normal pulses.     Heart sounds: Normal heart sounds.  Pulmonary:     Effort: Pulmonary effort is normal.     Breath sounds: Normal breath sounds.  Musculoskeletal:     Cervical back: Normal range of motion and neck supple.     Comments: Normal Muscle Bulk and Muscle Testing Reveals:  Upper Extremities: Full ROM and Muscle Strength 5/5 Right Hand with swelling and tenderness  Lumbar Paraspinal Tenderness: L-3-L-5 Left Greater Trochanter Tenderness Lower Extremities: Full ROM and Muscle Strength 5/5 Arises from chair with ease Narrow Based  Gait   Skin:    General: Skin is warm and dry.  Neurological:     Mental Status: She is alert and oriented to person, place, and time.  Psychiatric:        Mood and Affect: Mood normal.        Behavior: Behavior normal.           Assessment & Plan:  1.Chronic BilateralHip pain L>R---endstage OA of hips..Continue HEP as tolerated and Heat and Ice Therapy. Refilled:Hydrocodone 10/325 one q6 hours as needed for moderate pain #120. Second script e-scribed for the following month. 03/02/2019 We will continue the opioid monitoring program, this consists of regular clinic visits, examinations, urine  drug screen, pill counts as well as use of [place] Controlled Substance Reporting System. 2. Lumbar Radiculitis:Continue medication regimen withPamelor. Continue Cymbalta and Continue HEP as Tolerated. Continue to Monitor.03/02/2019- 3. Morbid obesity: Continue with healthy diet Regime and HEP. Encouraged to continue with healthy diet regime andHEP.03/02/2019 4.Left Greater Trochanteric Bursitis:.Continue with Heat and Ice Therapy. Continue to Monitor.03/02/2019 5. Left Knee Pain:No complaints today. Continue with HEP as Tolerated. Continue to Monitor.03/02/2019 6. Right middle finger tendonitis: Continue to alternate Ice and Heat Therapy. Continue to Monitor. 03/02/2019 7. Right Hand Pain: RX: X-ray: Denies falling. Continue to monitor.   53minutes of face to face patient care time was spent during this visit. All questions were encouraged and answered.  F/U in57months

## 2019-03-03 MED ORDER — DULOXETINE HCL 60 MG PO CPEP: 60.0000 mg | ORAL_CAPSULE | Freq: Two times a day (BID) | ORAL | 0 refills | Status: DC

## 2019-03-04 ENCOUNTER — Ambulatory Visit: Payer: BC Managed Care – PPO | Admitting: Cardiovascular Disease

## 2019-03-11 ENCOUNTER — Other Ambulatory Visit: Payer: Self-pay

## 2019-03-11 DIAGNOSIS — R609 Edema, unspecified: Secondary | ICD-10-CM

## 2019-03-11 DIAGNOSIS — I471 Supraventricular tachycardia: Secondary | ICD-10-CM

## 2019-03-11 MED ORDER — FUROSEMIDE 80 MG PO TABS: 80.0000 mg | ORAL_TABLET | Freq: Every day | ORAL | 0 refills | Status: DC

## 2019-03-12 MED ORDER — DEXILANT 60 MG PO CPDR: 1.0000 | DELAYED_RELEASE_CAPSULE | Freq: Every day | ORAL | 0 refills | Status: DC

## 2019-03-17 ENCOUNTER — Ambulatory Visit
Admission: RE | Admit: 2019-03-17 | Discharge: 2019-03-17 | Disposition: A | Discharge status: Home / Self Care | Payer: BC Managed Care – PPO | Source: Ambulatory Visit | Attending: Registered Nurse | Admitting: Registered Nurse

## 2019-03-18 ENCOUNTER — Ambulatory Visit (INDEPENDENT_AMBULATORY_CARE_PROVIDER_SITE_OTHER): Payer: BC Managed Care – PPO | Admitting: *Deleted

## 2019-03-18 ENCOUNTER — Telehealth: Payer: Self-pay | Admitting: Registered Nurse

## 2019-03-18 DIAGNOSIS — I471 Supraventricular tachycardia: Secondary | ICD-10-CM

## 2019-03-18 LAB — CUP PACEART REMOTE DEVICE CHECK
Date Time Interrogation Session: 20210105232136
Implantable Pulse Generator Implant Date: 20171120

## 2019-03-18 NOTE — Telephone Encounter (Signed)
Placed a call to [name], we reviewed her X-ray results. She verbalizes understanding.

## 2019-04-13 ENCOUNTER — Encounter: Payer: Self-pay | Admitting: Medical

## 2019-04-13 ENCOUNTER — Ambulatory Visit (INDEPENDENT_AMBULATORY_CARE_PROVIDER_SITE_OTHER): Payer: BC Managed Care – PPO | Admitting: Medical

## 2019-04-13 ENCOUNTER — Other Ambulatory Visit: Payer: Self-pay | Admitting: Medical

## 2019-04-13 ENCOUNTER — Other Ambulatory Visit: Payer: Self-pay

## 2019-04-13 VITALS — BP 160/78 | HR 102 | Temp 97.8°F | Ht 63.0 in | Wt 259.8 lb

## 2019-04-13 DIAGNOSIS — M25541 Pain in joints of right hand: Secondary | ICD-10-CM

## 2019-04-13 DIAGNOSIS — E785 Hyperlipidemia, unspecified: Secondary | ICD-10-CM

## 2019-04-13 DIAGNOSIS — M2540 Effusion, unspecified joint: Secondary | ICD-10-CM

## 2019-04-13 DIAGNOSIS — R220 Localized swelling, mass and lump, head: Secondary | ICD-10-CM | POA: Diagnosis not present

## 2019-04-13 DIAGNOSIS — R5383 Other fatigue: Secondary | ICD-10-CM | POA: Insufficient documentation

## 2019-04-13 DIAGNOSIS — F4310 Post-traumatic stress disorder, unspecified: Secondary | ICD-10-CM

## 2019-04-13 DIAGNOSIS — IMO0002 Reserved for concepts with insufficient information to code with codable children: Secondary | ICD-10-CM

## 2019-04-13 DIAGNOSIS — G894 Chronic pain syndrome: Secondary | ICD-10-CM

## 2019-04-13 DIAGNOSIS — E042 Nontoxic multinodular goiter: Secondary | ICD-10-CM

## 2019-04-13 DIAGNOSIS — R0602 Shortness of breath: Secondary | ICD-10-CM

## 2019-04-13 DIAGNOSIS — E118 Type 2 diabetes mellitus with unspecified complications: Secondary | ICD-10-CM

## 2019-04-13 DIAGNOSIS — F419 Anxiety disorder, unspecified: Secondary | ICD-10-CM

## 2019-04-13 DIAGNOSIS — Z0000 Encounter for general adult medical examination without abnormal findings: Secondary | ICD-10-CM

## 2019-04-13 DIAGNOSIS — R7989 Other specified abnormal findings of blood chemistry: Secondary | ICD-10-CM

## 2019-04-13 DIAGNOSIS — E1165 Type 2 diabetes mellitus with hyperglycemia: Secondary | ICD-10-CM

## 2019-04-13 DIAGNOSIS — I471 Supraventricular tachycardia: Secondary | ICD-10-CM

## 2019-04-13 DIAGNOSIS — F411 Generalized anxiety disorder: Secondary | ICD-10-CM

## 2019-04-13 NOTE — Progress Notes (Signed)
Subjective: Chief Complaint  Patient presents with  . Eye Problem  . Shortness of Breath   Here for concerns.   Has been having some symptoms she is worried about.    Medical team: Dr. Renato Shin, endocrinology Danella Sensing, NP, pain management Dr. Sanda Klein, cardiology Dr. Zenovia Jarred, GI  Concerns: For the past 1.5 months has felt fatigued, a little SOB.  Last week while getting hair cut, felt like right side of face seemed swollen.  Since the other symptoms started tongue is raw, upper part of mouth is raw, hurts to eat.  No other new symptoms.   Sister who is a nurse wanted her to be checked for lupus.  No recent covid contact, no hx/o covid infection.    No recent URI.     Anxiety - Sees Dr. Toy Care at end of March to establish for psychiatry.  Otherwise been compliant with medications.   Hasn't seen diabetes specialist since 05/2018.    No chest pain, no edema of late, no palpitations of late.     Past Medical History:  Diagnosis Date  . Abscess    increased drainage from abscess on buttock  . Anal fistula   . Anxiety   . Bilateral hip pain 05/27/2015  . Chronic pain syndrome 05/27/2015  . Depression    sees [name]  . Diabetes mellitus without complication ([place])   . Hypertension   . Sleep apnea    2008- sleep study, neg. for sleep apnea   . SVT (supraventricular tachycardia) ([facility])   . Symptomatic cholelithiasis 09/11/2018   Current Outpatient Medications on File Prior to Visit  Medication Sig Dispense Refill  . atorvastatin (LIPITOR) 20 MG tablet TAKE 1 TABLET BY MOUTH ONCE DAILY AT BEDTIME 15 tablet 0  . dexlansoprazole (DEXILANT) 60 MG capsule Take 1 capsule (60 mg total) by mouth daily. 30 capsule 0  . diclofenac (VOLTAREN) 50 MG EC tablet Take 1 tablet by mouth twice daily 60 tablet 2  . DULoxetine (CYMBALTA) 60 MG capsule Take 1 capsule (60 mg total) by mouth 2 (two) times daily. 60 capsule 0  . furosemide (LASIX) 80 MG tablet Take 1 tablet (80 mg total)  by mouth daily. 30 tablet 0  . HYDROcodone-acetaminophen (NORCO) 10-325 MG tablet Take 1 tablet by mouth every 6 (six) hours as needed. Please do not  Fill before 04/03/2019 120 tablet 0  . Insulin Glargine (BASAGLAR KWIKPEN) 100 UNIT/ML SOPN Inject 1.4 mLs (140 Units total) into the skin every morning. And pen needles 1/day 50 pen 3  . nortriptyline (PAMELOR) 25 MG capsule Take 1 capsule by mouth at bedtime 30 capsule 3  . carvedilol (COREG) 3.125 MG tablet Take 1 tablet (3.125 mg total) by mouth 2 (two) times daily. (Patient taking differently: Take 3.125 mg by mouth daily. ) 180 tablet 3  . fluconazole (DIFLUCAN) 150 MG tablet Take 1 tab today then another 1 tab in 3 days (Patient not taking: Reported on 04/13/2019) 2 tablet 0  . LORazepam (ATIVAN) 0.5 MG tablet Take 1 tablet (0.5 mg total) by mouth 2 (two) times daily as needed. for anxiety (Patient not taking: Reported on 04/13/2019) 60 tablet 0  . methocarbamol (ROBAXIN) 500 MG tablet Take 2 tablets (1,000 mg total) by mouth every 6 (six) hours as needed for muscle spasms. (Patient not taking: Reported on 04/13/2019) 30 tablet 1  . nystatin cream (MYCOSTATIN) Daily for 7 days then PRN (Patient not taking: Reported on 04/13/2019) 30 g 0  .  nystatin-triamcinolone (MYCOLOG II) cream APPLY  CREAM TOPICALLY TO AFFECTED AREA TWICE DAILY (Patient not taking: Reported on 04/13/2019) 30 g 0   No current facility-administered medications on file prior to visit.   ROS as in subjective   Objective: BP (!) 160/78   Pulse (!) 102   Temp 97.8 F (36.6 C)   Ht 5\' 3"  (1.6 m)   Wt 259 lb 12.8 oz (117.8 kg)   LMP 02/09/2017   SpO2 97%   BMI 46.02 kg/m    BP Readings from Last 3 Encounters:  04/13/19 (!) 160/78  03/02/19 138/77  12/31/18 (!) 149/84   Wt Readings from Last 3 Encounters:  04/13/19 259 lb 12.8 oz (117.8 kg)  03/02/19 265 lb (120.2 kg)  12/31/18 272 lb (123.4 kg)   Gen: wd, wn, nad, obese Hispanic female No obvious facial swelling today  but she has a pic showing subtle generalized fullness in right face a few days ago.  No other new skin lesions Neck: supple, nontender, +prominent right carotid pulsation but no frank JVD, mild generalized thyroid goiter palpated, no lymphadenopathy Lungs clear Heart mildly tachycardic, otherwise normal s1, s2, no murmurs Ext: no LE edema Pulses 2+ UE and LE Abdomen: +bs, soft, mild upper generalized tendnerss, but no mass no organomegaly, no rebound Oral: MMM, pinkish irritated mucosa of palate, tongue also appears mildly inflamed in general, but no other lesions.    Assessment: Encounter Diagnoses  Name Primary?  . SOB (shortness of breath) Yes  . Facial swelling   . Fatigue, unspecified type   . 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 AVNRT (AV nodal re-entry tachycardia) ([place])   . Uncontrolled diabetes mellitus with complications ([place])   . Multinodular goiter   . Low TSH level   . Generalized anxiety disorder   . Chronic pain syndrome   . Hyperlipidemia, unspecified hyperlipidemia type   . PTSD (post-traumatic stress disorder)   . Encounter for health maintenance examination in adult   . Joint swelling   . Arthralgia of right hand      Plan: discussed concerns, discussed the large differential for fatigue, and differential for SOB.     Gave script for magic mouthwash for unusual irritation in upper palate x 3.5 months.     Reviewed 05/2017 sleep study that did not show OSA or hypoxia.  Today was a problem focused visit, not well ness exam.   However, labs placed for separate order for wellness exam coming up soon, wanted labs drawn now, but the psychical is schedule in a few weeks.  Abnormal findings today: mildly tachycardic, abnormal irritation of palate of mouth, elevated BP, hx/o abnormal thyroid labs, uncontrolled blood sugars, goiter, and recent picture showing subtle facial swelling unilaterally.   Of note, she had rheumatoid screening labs 4  years ago, normal.       Vung was seen today for eye problem and shortness of breath.  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  AVNRT (AV nodal re-entry tachycardia) ([place])  Uncontrolled diabetes mellitus with complications ([facility])  Multinodular goiter -     T4, free -     TSH  Low TSH level -     T4, free -     TSH  Generalized anxiety disorder  Chronic pain syndrome  Hyperlipidemia, unspecified hyperlipidemia type  PTSD (post-traumatic stress disorder)  Encounter for health maintenance examination in adult -     ANA+ENA+DNA/DS+Scl 70+SjoSSA/B -     Sedimentation rate -     Hemoglobin A1c -     T4, free -     TSH -     CBC with Differential/Platelet -     Comprehensive metabolic panel  Joint swelling -     ANA+ENA+DNA/DS+Scl 70+SjoSSA/B -     Sedimentation rate  Arthralgia of right hand -     ANA+ENA+DNA/DS+Scl 70+SjoSSA/B -     Sedimentation rate

## 2019-04-14 LAB — CBC WITH DIFFERENTIAL/PLATELET
Basophils Absolute: 0.1 10*3/uL (ref 0.0–0.2)
Basos: 1 %
EOS (ABSOLUTE): 0.3 10*3/uL (ref 0.0–0.4)
Eos: 2 %
Hematocrit: 33.1 % — ABNORMAL LOW (ref 34.0–46.6)
Hemoglobin: 9.7 g/dL — ABNORMAL LOW (ref 11.1–15.9)
Immature Grans (Abs): 0.1 10*3/uL (ref 0.0–0.1)
Immature Granulocytes: 1 %
Lymphocytes Absolute: 2.4 10*3/uL (ref 0.7–3.1)
Lymphs: 17 %
MCH: 19.2 pg — ABNORMAL LOW (ref 26.6–33.0)
MCHC: 29.3 g/dL — ABNORMAL LOW (ref 31.5–35.7)
MCV: 66 fL — ABNORMAL LOW (ref 79–97)
Monocytes Absolute: 0.6 10*3/uL (ref 0.1–0.9)
Monocytes: 4 %
Neutrophils Absolute: 10.5 10*3/uL — ABNORMAL HIGH (ref 1.4–7.0)
Neutrophils: 75 %
Platelets: 401 10*3/uL (ref 150–450)
RBC: 5.04 x10E6/uL (ref 3.77–5.28)
RDW: 19.2 % — ABNORMAL HIGH (ref 11.7–15.4)
WBC: 14 10*3/uL — ABNORMAL HIGH (ref 3.4–10.8)

## 2019-04-14 LAB — COMPREHENSIVE METABOLIC PANEL
ALT: 14 IU/L (ref 0–32)
AST: 15 IU/L (ref 0–40)
Albumin/Globulin Ratio: 1.3 (ref 1.2–2.2)
Albumin: 3.6 g/dL — ABNORMAL LOW (ref 3.8–4.9)
Alkaline Phosphatase: 204 IU/L — ABNORMAL HIGH (ref 39–117)
BUN/Creatinine Ratio: 11 (ref 9–23)
BUN: 11 mg/dL (ref 6–24)
Bilirubin Total: 0.3 mg/dL (ref 0.0–1.2)
CO2: 21 mmol/L (ref 20–29)
Calcium: 9.1 mg/dL (ref 8.7–10.2)
Chloride: 101 mmol/L (ref 96–106)
Creatinine, Ser: 1.03 mg/dL — ABNORMAL HIGH (ref 0.57–1.00)
GFR calc Af Amer: 69 mL/min/{1.73_m2} (ref >59)
GFR calc non Af Amer: 60 mL/min/{1.73_m2} (ref >59)
Globulin, Total: 2.7 g/dL (ref 1.5–4.5)
Glucose: 393 mg/dL — ABNORMAL HIGH (ref 65–99)
Potassium: 3.8 mmol/L (ref 3.5–5.2)
Sodium: 139 mmol/L (ref 134–144)
Total Protein: 6.3 g/dL (ref 6.0–8.5)

## 2019-04-14 LAB — ANA+ENA+DNA/DS+SCL 70+SJOSSA/B
ANA Titer 1: NEGATIVE
ENA RNP Ab: <0.2 AI (ref 0.0–0.9)
ENA SM Ab Ser-aCnc: <0.2 AI (ref 0.0–0.9)
ENA SSA (RO) Ab: <0.2 AI (ref 0.0–0.9)
ENA SSB (LA) Ab: <0.2 AI (ref 0.0–0.9)
Scleroderma (Scl-70) (ENA) Antibody, IgG: <0.2 AI (ref 0.0–0.9)
dsDNA Ab: <1 IU/mL (ref 0–9)

## 2019-04-14 LAB — SEDIMENTATION RATE: Sed Rate: 43 mm/hr — ABNORMAL HIGH (ref 0–40)

## 2019-04-14 LAB — T4, FREE: Free T4: 0.82 ng/dL (ref 0.82–1.77)

## 2019-04-14 LAB — HEMOGLOBIN A1C
Est. average glucose Bld gHb Est-mCnc: 344 mg/dL
Hgb A1c MFr Bld: 13.6 % — ABNORMAL HIGH (ref 4.8–5.6)

## 2019-04-14 LAB — TSH: TSH: 0.283 u[IU]/mL — ABNORMAL LOW (ref 0.450–4.500)

## 2019-04-16 ENCOUNTER — Other Ambulatory Visit: Payer: Self-pay | Admitting: Medical

## 2019-04-17 ENCOUNTER — Other Ambulatory Visit: Payer: Self-pay | Admitting: Medical

## 2019-04-17 MED ORDER — CARVEDILOL 6.25 MG PO TABS: 6.2500 mg | ORAL_TABLET | Freq: Two times a day (BID) | ORAL | 0 refills | Status: DC

## 2019-04-17 MED ORDER — ATORVASTATIN CALCIUM 20 MG PO TABS: 20.0000 mg | ORAL_TABLET | Freq: Every day | ORAL | 1 refills | Status: DC

## 2019-04-17 MED ORDER — FERROUS GLUCONATE 324 (38 FE) MG PO TABS: 324.0000 mg | ORAL_TABLET | Freq: Two times a day (BID) | ORAL | 1 refills | Status: DC

## 2019-04-17 MED ORDER — BASAGLAR KWIKPEN 100 UNIT/ML ~~LOC~~ SOPN: 140.0000 [IU] | PEN_INJECTOR | SUBCUTANEOUS | 0 refills | Status: DC

## 2019-04-17 MED ORDER — FARXIGA 5 MG PO TABS: 5.0000 mg | ORAL_TABLET | Freq: Every day | ORAL | 0 refills | Status: DC

## 2019-04-17 MED ORDER — DEXILANT 60 MG PO CPDR: 1.0000 | DELAYED_RELEASE_CAPSULE | Freq: Every day | ORAL | 0 refills | Status: DC

## 2019-04-17 MED ORDER — DULOXETINE HCL 60 MG PO CPEP: 60.0000 mg | ORAL_CAPSULE | Freq: Two times a day (BID) | ORAL | 0 refills | Status: DC

## 2019-04-20 ENCOUNTER — Other Ambulatory Visit: Payer: Self-pay | Admitting: Physical Medicine & Rehabilitation

## 2019-04-20 ENCOUNTER — Other Ambulatory Visit: Payer: Self-pay | Admitting: Registered Nurse

## 2019-04-20 ENCOUNTER — Other Ambulatory Visit: Payer: Self-pay | Admitting: Medical

## 2019-04-20 ENCOUNTER — Ambulatory Visit (INDEPENDENT_AMBULATORY_CARE_PROVIDER_SITE_OTHER): Payer: BC Managed Care – PPO | Admitting: *Deleted

## 2019-04-20 ENCOUNTER — Telehealth: Payer: Self-pay | Admitting: Medical

## 2019-04-20 DIAGNOSIS — I471 Supraventricular tachycardia: Secondary | ICD-10-CM

## 2019-04-20 DIAGNOSIS — R609 Edema, unspecified: Secondary | ICD-10-CM

## 2019-04-20 DIAGNOSIS — M160 Bilateral primary osteoarthritis of hip: Secondary | ICD-10-CM

## 2019-04-20 DIAGNOSIS — G894 Chronic pain syndrome: Secondary | ICD-10-CM

## 2019-04-20 DIAGNOSIS — Z5181 Encounter for therapeutic drug level monitoring: Secondary | ICD-10-CM

## 2019-04-20 DIAGNOSIS — Z79899 Other long term (current) drug therapy: Secondary | ICD-10-CM

## 2019-04-20 LAB — CUP PACEART REMOTE DEVICE CHECK
Date Time Interrogation Session: 20210207231507
Implantable Pulse Generator Implant Date: 20171120

## 2019-04-20 MED ORDER — JARDIANCE 10 MG PO TABS: 10.0000 mg | ORAL_TABLET | Freq: Every day | ORAL | 1 refills | Status: DC

## 2019-04-20 NOTE — Telephone Encounter (Signed)
Patient informed and has been given samples.

## 2019-04-20 NOTE — Telephone Encounter (Signed)
I received notice that [name] would not be covered by insurance.  I sent Jardiance 1 tablet daily as an alternative to [place].  Have her begin this, give samples if possible.  Lets see if this is covered by insurance

## 2019-04-21 NOTE — Progress Notes (Signed)
ILR Remote 

## 2019-04-23 ENCOUNTER — Other Ambulatory Visit: Payer: Self-pay | Admitting: Medical

## 2019-04-23 DIAGNOSIS — I471 Supraventricular tachycardia: Secondary | ICD-10-CM

## 2019-04-23 DIAGNOSIS — R609 Edema, unspecified: Secondary | ICD-10-CM

## 2019-04-23 MED ORDER — POTASSIUM CHLORIDE ER 10 MEQ PO TBCR: 10.0000 meq | EXTENDED_RELEASE_TABLET | Freq: Every day | ORAL | 1 refills | Status: DC

## 2019-04-23 MED ORDER — FUROSEMIDE 80 MG PO TABS: 80.0000 mg | ORAL_TABLET | Freq: Every day | ORAL | 0 refills | Status: DC

## 2019-04-24 ENCOUNTER — Ambulatory Visit: Payer: Self-pay

## 2019-04-29 ENCOUNTER — Ambulatory Visit
Admission: RE | Admit: 2019-04-29 | Discharge: 2019-04-29 | Disposition: A | Discharge status: Home / Self Care | Payer: BC Managed Care – PPO | Source: Ambulatory Visit | Attending: Medical | Admitting: Medical

## 2019-04-29 DIAGNOSIS — R0602 Shortness of breath: Secondary | ICD-10-CM

## 2019-05-04 ENCOUNTER — Encounter: Payer: BC Managed Care – PPO | Attending: Physical Medicine & Rehabilitation | Admitting: Registered Nurse

## 2019-05-04 ENCOUNTER — Other Ambulatory Visit: Payer: Self-pay

## 2019-05-04 VITALS — BP 135/80 | HR 79 | Temp 97.5°F | Ht 63.0 in | Wt 262.0 lb

## 2019-05-04 DIAGNOSIS — M7062 Trochanteric bursitis, left hip: Secondary | ICD-10-CM

## 2019-05-04 DIAGNOSIS — M5416 Radiculopathy, lumbar region: Secondary | ICD-10-CM | POA: Diagnosis not present

## 2019-05-04 DIAGNOSIS — F418 Other specified anxiety disorders: Secondary | ICD-10-CM | POA: Diagnosis not present

## 2019-05-04 DIAGNOSIS — Z833 Family history of diabetes mellitus: Secondary | ICD-10-CM | POA: Insufficient documentation

## 2019-05-04 DIAGNOSIS — I10 Essential (primary) hypertension: Secondary | ICD-10-CM | POA: Diagnosis not present

## 2019-05-04 DIAGNOSIS — M79641 Pain in right hand: Secondary | ICD-10-CM | POA: Diagnosis present

## 2019-05-04 DIAGNOSIS — M25551 Pain in right hip: Secondary | ICD-10-CM | POA: Diagnosis not present

## 2019-05-04 DIAGNOSIS — Z79891 Long term (current) use of opiate analgesic: Secondary | ICD-10-CM

## 2019-05-04 DIAGNOSIS — Z8249 Family history of ischemic heart disease and other diseases of the circulatory system: Secondary | ICD-10-CM | POA: Diagnosis not present

## 2019-05-04 DIAGNOSIS — M25552 Pain in left hip: Secondary | ICD-10-CM | POA: Insufficient documentation

## 2019-05-04 DIAGNOSIS — Z809 Family history of malignant neoplasm, unspecified: Secondary | ICD-10-CM | POA: Diagnosis not present

## 2019-05-04 DIAGNOSIS — G4730 Sleep apnea, unspecified: Secondary | ICD-10-CM | POA: Insufficient documentation

## 2019-05-04 DIAGNOSIS — G894 Chronic pain syndrome: Secondary | ICD-10-CM

## 2019-05-04 DIAGNOSIS — Z5181 Encounter for therapeutic drug level monitoring: Secondary | ICD-10-CM | POA: Diagnosis not present

## 2019-05-04 DIAGNOSIS — E119 Type 2 diabetes mellitus without complications: Secondary | ICD-10-CM | POA: Diagnosis not present

## 2019-05-04 DIAGNOSIS — G8929 Other chronic pain: Secondary | ICD-10-CM | POA: Diagnosis present

## 2019-05-04 DIAGNOSIS — M7061 Trochanteric bursitis, right hip: Secondary | ICD-10-CM

## 2019-05-04 DIAGNOSIS — I471 Supraventricular tachycardia: Secondary | ICD-10-CM | POA: Diagnosis not present

## 2019-05-04 MED ORDER — HYDROCODONE-ACETAMINOPHEN 10-325 MG PO TABS: 1.0000 | ORAL_TABLET | Freq: Four times a day (QID) | ORAL | 0 refills | Status: DC | PRN

## 2019-05-04 NOTE — Progress Notes (Signed)
Subjective:    Patient ID: [name], female    DOB: June 11, 1960, 59 y.o.   MRN: [id number]  HPI: [name] is a 58 y.o. female who returns for follow up appointment for chronic pain and medication refill. She states her pain is located in her lower back pain radiating into her bilateral hips L>R. He rates his pain 8. His current exercise regime is walking and performing stretching exercises.  Ms. Leath Morphine equivalent is 40.00 MME. Oral Swab was Performed today.   Pain Inventory Average Pain 7 Pain Right Now 8 My pain is sharp, dull and aching  In the last 24 hours, has pain interfered with the following? General activity 9 Relation with others 9 Enjoyment of life 10 What TIME of day is your pain at its worst? evening and night Sleep (in general) Fair  Pain is worse with: walking, bending, standing and some activites Pain improves with: rest, heat/ice, therapy/exercise, pacing activities and medication Relief from Meds: 5  Mobility how many minutes can you walk? 30 ability to climb steps?  yes do you drive?  yes  Function employed # of hrs/week 40 I need assistance with the following:  meal prep and household duties  Neuro/Psych numbness anxiety  Prior Studies Any changes since last visit?  no  Physicians involved in your care Any changes since last visit?  no   Family History  Problem Relation Age of Onset  . Hypertension Mother   . Alzheimer's disease Mother   . Diabetes Father   . Breast cancer Sister 62  . Anesthesia problems Neg Hx   . Hypotension Neg Hx   . Malignant hyperthermia Neg Hx   . Pseudochol deficiency Neg Hx   . Colon cancer Neg Hx   . Esophageal cancer Neg Hx   . Stomach cancer Neg Hx   . Rectal cancer Neg Hx   . Thyroid disease Neg Hx    Social History   Socioeconomic History  . Marital status: Divorced    Spouse name: Not on file  . Number of children: 0  . Years of education: Not on file  . Highest  education level: Not on file  Occupational History  . Occupation: TEACHER ASSISTANT  Tobacco Use  . Smoking status: Never Smoker  . Smokeless tobacco: Never Used  Substance and Sexual Activity  . Alcohol use: No  . Drug use: No  . Sexual activity: Yes  Other Topics Concern  . Not on file  Social History Narrative  . Not on file   Social Determinants of Health   Financial Resource Strain:   . Difficulty of Paying Living Expenses: Not on file  Food Insecurity:   . Worried About Charity fundraiser in the Last Year: Not on file  . Ran Out of Food in the Last Year: Not on file  Transportation Needs:   . Lack of Transportation (Medical): Not on file  . Lack of Transportation (Non-Medical): Not on file  Physical Activity:   . Days of Exercise per Week: Not on file  . Minutes of Exercise per Session: Not on file  Stress:   . Feeling of Stress : Not on file  Social Connections:   . Frequency of Communication with Friends and Family: Not on file  . Frequency of Social Gatherings with Friends and Family: Not on file  . Attends Religious Services: Not on file  . Active Member of Clubs or Organizations: Not on file  . Attends Club or  Organization Meetings: Not on file  . Marital Status: Not on file   Past Surgical History:  Procedure Laterality Date  . ANAL EXAMINATION UNDER ANESTHESIA  02/21/11   anal fistula  . BREAST SURGERY  patient does not remember date of procedure   pull fluid off lft br  . CHOLECYSTECTOMY N/A 09/12/2018   Procedure: LAPAROSCOPIC CHOLECYSTECTOMY;  Surgeon: Stark Klein, MD;  Location: [facility];  Service: General;  Laterality: N/A;  . ELECTROPHYSIOLOGIC STUDY N/A 05/05/2015   Procedure: SVT Ablation;  Surgeon: Will Meredith Leeds, MD;  Location: [facility];  Service: Cardiovascular;  Laterality: N/A;  . EP IMPLANTABLE DEVICE N/A 01/30/2016   Procedure: Loop Recorder Insertion;  Surgeon: Sanda Klein, MD;  Location: [facility];  Service:  Cardiovascular;  Laterality: N/A;  . INCISE AND DRAIN ABCESS     abscess on right thigh and buttock  . KNEE ARTHROSCOPY     left  . LAPAROSCOPIC APPENDECTOMY N/A 09/19/2018   Procedure: APPENDECTOMY LAPAROSCOPIC;  Surgeon: Clovis Riley, MD;  Location: [facility];  Service: General;  Laterality: N/A;  . SHOULDER SURGERY  04/14/09   right   Past Medical History:  Diagnosis Date  . Abscess    increased drainage from abscess on buttock  . Anal fistula   . Anxiety   . Bilateral hip pain 05/27/2015  . Chronic pain syndrome 05/27/2015  . Depression    sees [name]  . Diabetes mellitus without complication ([place])   . Hypertension   . Sleep apnea    2008- sleep study, neg. for sleep apnea   . SVT (supraventricular tachycardia) ([facility])   . Symptomatic cholelithiasis 09/11/2018   BP 135/80   Pulse 79   Temp (!) 97.5 F (36.4 C)   Ht 5\' 3"  (1.6 m)   Wt 262 lb (118.8 kg)   LMP 02/09/2017   SpO2 93%   BMI 46.41 kg/m   Opioid Risk Score:   Fall Risk Score:  `1  Depression screen PHQ 2/9  Depression screen [facility] 2/9 10/28/2017 10/02/2017 07/15/2017 05/23/2017 04/12/2017 03/21/2017 12/24/2016  Decreased Interest 0 0 0 1 3 0 1  Down, Depressed, Hopeless 0 3 0 1 2 0 1  PHQ - 2 Score 0 3 0 2 5 0 2  Altered sleeping - 1 - 0 3 - -  Tired, decreased energy - 0 - 1 3 - -  Change in appetite - 1 - 0 2 - -  Feeling bad or failure about yourself  - 0 - 0 1 - -  Trouble concentrating - 0 - 0 1 - -  Moving slowly or fidgety/restless - 0 - 0 0 - -  Suicidal thoughts - 0 - 0 0 - -  PHQ-9 Score - 5 - 3 15 - -  Difficult doing work/chores - Somewhat difficult - - - - -  Some recent data might be hidden   Review of Systems  Neurological: Positive for numbness.  Psychiatric/Behavioral: The patient is nervous/anxious.   All other systems reviewed and are negative.      Objective:   Physical Exam Vitals and nursing note reviewed.  Constitutional:      Appearance: Normal appearance. She is obese.    Cardiovascular:     Rate and Rhythm: Normal rate and regular rhythm.     Pulses: Normal pulses.     Heart sounds: Normal heart sounds.  Pulmonary:     Effort: Pulmonary effort is normal.  Breath sounds: Normal breath sounds.  Musculoskeletal:     Cervical back: Normal range of motion and neck supple.     Comments: Normal Muscle Bulk and Muscle Testing Reveals:  Upper Extremities: Full ROM and Muscle Strength 5/5 Lumbar Paraspinal Tenderness: L-4-L-5 Lower Extremities: Right: Full ROM and Muscle Strength 5/5 Left: Decreased ROM and Muscle Strength 5/5 Left Lower Extremity Flexion Produces Pain into Left Lower Extremity Arises from Table with ease Narrow Based Gait   Neurological:     Mental Status: She is alert and oriented to person, place, and time.  Psychiatric:        Mood and Affect: Mood normal.        Behavior: Behavior normal.           Assessment & Plan:  1.Chronic BilateralHip pain L>R---endstage OA of hips..Continue HEP as tolerated and Heat and Ice Therapy. Refilled:Hydrocodone 10/325 one q6 hours as needed for moderate pain #120. Second script e-scribed for the following month. 05/04/2019 We will continue the opioid monitoring program, this consists of regular clinic visits, examinations, urine drug screen, pill counts as well as use of [place] Controlled Substance Reporting System. 2. Lumbar Radiculitis:Continue medication regimen withPamelor. Continue Cymbalta and Continue HEP as Tolerated. Continue to Monitor.05/04/2019.- 3. Morbid obesity: Continue with healthy diet Regime and HEP. Encouraged to continue with healthy diet regime andHEP.05/04/2019 4.Left Greater Trochanteric Bursitis:.Continue with Heat and Ice Therapy. Continue to Monitor.05/04/2019. 5. Left Knee Pain:No complaints today. Continue with HEP as Tolerated. Continue to Monitor.05/04/2019   47minutes of face to face patient care time was spent  during this visit. All questions were encouraged and answered.  F/U in38months

## 2019-05-05 ENCOUNTER — Encounter: Payer: Self-pay | Admitting: Registered Nurse

## 2019-05-07 ENCOUNTER — Ambulatory Visit: Payer: BC Managed Care – PPO | Attending: Internal Medicine

## 2019-05-07 DIAGNOSIS — Z23 Encounter for immunization: Secondary | ICD-10-CM

## 2019-05-07 NOTE — Progress Notes (Signed)
   Covid-19 Vaccination Clinic  Name:  [name]    MRN: [id number] DOB: 1960/09/24  05/07/2019  [name] was observed post Covid-19 immunization for 15 minutes without incidence. She was provided with Vaccine Information Sheet and instruction to access the V-Safe system.   [name] was instructed to call 911 with any severe reactions post vaccine: Marland Kitchen Difficulty breathing  . Swelling of your face and throat  . A fast heartbeat  . A bad rash all over your body  . Dizziness and weakness    Immunizations Administered    Name Date Dose VIS Date Route   Pfizer COVID-19 Vaccine 05/07/2019  3:20 PM 0.3 mL 02/20/2019 Intramuscular   Manufacturer: [place]   Lot: [id number]   [place]: [id number]

## 2019-05-08 ENCOUNTER — Encounter: Payer: BC Managed Care – PPO | Admitting: Medical

## 2019-05-08 LAB — DRUG TOX MONITOR 1 W/CONF, ORAL FLD
Amphetamines: NEGATIVE ng/mL (ref <10)
Barbiturates: NEGATIVE ng/mL (ref <10)
Benzodiazepines: NEGATIVE ng/mL (ref <0.50)
Buprenorphine: NEGATIVE ng/mL (ref <0.10)
Cocaine: NEGATIVE ng/mL (ref <5.0)
Codeine: NEGATIVE ng/mL (ref <2.5)
Dihydrocodeine: 20.4 ng/mL — ABNORMAL HIGH (ref <2.5)
Fentanyl: NEGATIVE ng/mL (ref <0.10)
Heroin Metabolite: NEGATIVE ng/mL (ref <1.0)
Hydrocodone: 203.5 ng/mL — ABNORMAL HIGH (ref <2.5)
Hydromorphone: NEGATIVE ng/mL (ref <2.5)
MARIJUANA: NEGATIVE ng/mL (ref <2.5)
MDMA: NEGATIVE ng/mL (ref <10)
Meprobamate: NEGATIVE ng/mL (ref <2.5)
Methadone: NEGATIVE ng/mL (ref <5.0)
Morphine: NEGATIVE ng/mL (ref <2.5)
Nicotine Metabolite: NEGATIVE ng/mL (ref <5.0)
Norhydrocodone: 4.9 ng/mL — ABNORMAL HIGH (ref <2.5)
Noroxycodone: NEGATIVE ng/mL (ref <2.5)
Opiates: POSITIVE ng/mL — AB (ref <2.5)
Oxycodone: NEGATIVE ng/mL (ref <2.5)
Oxymorphone: NEGATIVE ng/mL (ref <2.5)
Phencyclidine: NEGATIVE ng/mL (ref <10)
Tapentadol: NEGATIVE ng/mL (ref <5.0)
Tramadol: NEGATIVE ng/mL (ref <5.0)
Zolpidem: NEGATIVE ng/mL (ref <5.0)

## 2019-05-08 LAB — DRUG TOX ALC METAB W/CON, ORAL FLD: Alcohol Metabolite: NEGATIVE ng/mL (ref <25)

## 2019-05-12 ENCOUNTER — Other Ambulatory Visit: Payer: Self-pay

## 2019-05-12 MED ORDER — FERROUS GLUCONATE 324 (38 FE) MG PO TABS: 324.0000 mg | ORAL_TABLET | Freq: Two times a day (BID) | ORAL | 0 refills | Status: DC

## 2019-05-12 MED ORDER — FARXIGA 5 MG PO TABS: 5.0000 mg | ORAL_TABLET | Freq: Every morning | ORAL | 0 refills | Status: DC

## 2019-05-18 ENCOUNTER — Telehealth: Payer: Self-pay

## 2019-05-18 NOTE — Telephone Encounter (Signed)
Oral swab drug screen was consistent for prescribed medications.  ?

## 2019-05-21 ENCOUNTER — Ambulatory Visit (INDEPENDENT_AMBULATORY_CARE_PROVIDER_SITE_OTHER): Payer: BC Managed Care – PPO | Admitting: *Deleted

## 2019-05-21 DIAGNOSIS — I471 Supraventricular tachycardia: Secondary | ICD-10-CM

## 2019-05-21 LAB — CUP PACEART REMOTE DEVICE CHECK
Date Time Interrogation Session: 20210310235221
Implantable Pulse Generator Implant Date: 20171120

## 2019-05-22 NOTE — Progress Notes (Signed)
ILR Remote 

## 2019-06-03 ENCOUNTER — Ambulatory Visit: Payer: BC Managed Care – PPO | Attending: Internal Medicine

## 2019-06-03 DIAGNOSIS — Z23 Encounter for immunization: Secondary | ICD-10-CM

## 2019-06-03 NOTE — Progress Notes (Signed)
   Covid-19 Vaccination Clinic  Name:  [name]    MRN: [id number] DOB: 09/05/1960  06/03/2019  [name] was observed post Covid-19 immunization for 15 minutes without incident. She was provided with Vaccine Information Sheet and instruction to access the V-Safe system.   [name] was instructed to call 911 with any severe reactions post vaccine: Marland Kitchen Difficulty breathing  . Swelling of face and throat  . A fast heartbeat  . A bad rash all over body  . Dizziness and weakness   Immunizations Administered    Name Date Dose VIS Date Route   Pfizer COVID-19 Vaccine 06/03/2019  2:47 PM 0.3 mL 02/20/2019 Intramuscular   Manufacturer: [place]   Lot: [id number]   [place]: [id number]

## 2019-06-08 ENCOUNTER — Encounter (HOSPITAL_COMMUNITY): Payer: Self-pay | Admitting: *Deleted

## 2019-06-08 ENCOUNTER — Observation Stay (HOSPITAL_COMMUNITY): Payer: BC Managed Care – PPO

## 2019-06-08 ENCOUNTER — Emergency Department (HOSPITAL_COMMUNITY): Payer: BC Managed Care – PPO

## 2019-06-08 ENCOUNTER — Observation Stay (HOSPITAL_COMMUNITY)
Admission: EM | Admit: 2019-06-08 | Discharge: 2019-06-09 | Disposition: A | Discharge status: Home / Self Care | Payer: BC Managed Care – PPO | Attending: Internal Medicine | Admitting: Internal Medicine

## 2019-06-08 ENCOUNTER — Other Ambulatory Visit: Payer: Self-pay

## 2019-06-08 DIAGNOSIS — Z885 Allergy status to narcotic agent status: Secondary | ICD-10-CM | POA: Insufficient documentation

## 2019-06-08 DIAGNOSIS — R531 Weakness: Secondary | ICD-10-CM | POA: Diagnosis present

## 2019-06-08 DIAGNOSIS — E119 Type 2 diabetes mellitus without complications: Secondary | ICD-10-CM | POA: Insufficient documentation

## 2019-06-08 DIAGNOSIS — G894 Chronic pain syndrome: Secondary | ICD-10-CM | POA: Diagnosis not present

## 2019-06-08 DIAGNOSIS — E785 Hyperlipidemia, unspecified: Secondary | ICD-10-CM | POA: Insufficient documentation

## 2019-06-08 DIAGNOSIS — Z794 Long term (current) use of insulin: Secondary | ICD-10-CM | POA: Insufficient documentation

## 2019-06-08 DIAGNOSIS — I639 Cerebral infarction, unspecified: Secondary | ICD-10-CM | POA: Insufficient documentation

## 2019-06-08 DIAGNOSIS — E1165 Type 2 diabetes mellitus with hyperglycemia: Secondary | ICD-10-CM

## 2019-06-08 DIAGNOSIS — E669 Obesity, unspecified: Secondary | ICD-10-CM | POA: Diagnosis not present

## 2019-06-08 DIAGNOSIS — I6350 Cerebral infarction due to unspecified occlusion or stenosis of unspecified cerebral artery: Principal | ICD-10-CM | POA: Insufficient documentation

## 2019-06-08 DIAGNOSIS — F419 Anxiety disorder, unspecified: Secondary | ICD-10-CM | POA: Diagnosis present

## 2019-06-08 DIAGNOSIS — Z79899 Other long term (current) drug therapy: Secondary | ICD-10-CM | POA: Diagnosis not present

## 2019-06-08 DIAGNOSIS — IMO0002 Reserved for concepts with insufficient information to code with codable children: Secondary | ICD-10-CM | POA: Diagnosis present

## 2019-06-08 DIAGNOSIS — Z20822 Contact with and (suspected) exposure to covid-19: Secondary | ICD-10-CM | POA: Insufficient documentation

## 2019-06-08 DIAGNOSIS — I6381 Other cerebral infarction due to occlusion or stenosis of small artery: Secondary | ICD-10-CM

## 2019-06-08 DIAGNOSIS — I10 Essential (primary) hypertension: Secondary | ICD-10-CM | POA: Diagnosis not present

## 2019-06-08 DIAGNOSIS — E66811 Obesity, class 1: Secondary | ICD-10-CM | POA: Diagnosis present

## 2019-06-08 DIAGNOSIS — D329 Benign neoplasm of meninges, unspecified: Secondary | ICD-10-CM | POA: Diagnosis not present

## 2019-06-08 HISTORY — DX: Cerebral infarction, unspecified: I63.9

## 2019-06-08 LAB — I-STAT CHEM 8, ED
BUN: 20 mg/dL (ref 6–20)
Calcium, Ion: 1.14 mmol/L — ABNORMAL LOW (ref 1.15–1.40)
Chloride: 101 mmol/L (ref 98–111)
Creatinine, Ser: 1.2 mg/dL — ABNORMAL HIGH (ref 0.44–1.00)
Glucose, Bld: 469 mg/dL — ABNORMAL HIGH (ref 70–99)
HCT: 42 % (ref 36.0–46.0)
Hemoglobin: 14.3 g/dL (ref 12.0–15.0)
Potassium: 3.9 mmol/L (ref 3.5–5.1)
Sodium: 135 mmol/L (ref 135–145)
TCO2: 25 mmol/L (ref 22–32)

## 2019-06-08 LAB — DIFFERENTIAL
Abs Immature Granulocytes: 0.08 10*3/uL — ABNORMAL HIGH (ref 0.00–0.07)
Basophils Absolute: 0.1 10*3/uL (ref 0.0–0.1)
Basophils Relative: 1 %
Eosinophils Absolute: 0.4 10*3/uL (ref 0.0–0.5)
Eosinophils Relative: 3 %
Immature Granulocytes: 1 %
Lymphocytes Relative: 18 %
Lymphs Abs: 2.6 10*3/uL (ref 0.7–4.0)
Monocytes Absolute: 0.6 10*3/uL (ref 0.1–1.0)
Monocytes Relative: 4 %
Neutro Abs: 10.1 10*3/uL — ABNORMAL HIGH (ref 1.7–7.7)
Neutrophils Relative %: 73 %

## 2019-06-08 LAB — CBG MONITORING, ED
Glucose-Capillary: 290 mg/dL — ABNORMAL HIGH (ref 70–99)
Glucose-Capillary: 192 mg/dL — ABNORMAL HIGH (ref 70–99)

## 2019-06-08 LAB — ETHANOL: Alcohol, Ethyl (B): <10 mg/dL (ref <10)

## 2019-06-08 LAB — PROTIME-INR
INR: 1 (ref 0.8–1.2)
Prothrombin Time: 12.9 seconds (ref 11.4–15.2)

## 2019-06-08 LAB — CBC
HCT: 42 % (ref 36.0–46.0)
Hemoglobin: 12.5 g/dL (ref 12.0–15.0)
MCH: 22.6 pg — ABNORMAL LOW (ref 26.0–34.0)
MCHC: 29.8 g/dL — ABNORMAL LOW (ref 30.0–36.0)
MCV: 75.8 fL — ABNORMAL LOW (ref 80.0–100.0)
Platelets: 427 10*3/uL — ABNORMAL HIGH (ref 150–400)
RBC: 5.54 MIL/uL — ABNORMAL HIGH (ref 3.87–5.11)
RDW: 23.8 % — ABNORMAL HIGH (ref 11.5–15.5)
WBC: 13.8 10*3/uL — ABNORMAL HIGH (ref 4.0–10.5)
nRBC: 0 % (ref 0.0–0.2)

## 2019-06-08 LAB — COMPREHENSIVE METABOLIC PANEL
ALT: 17 U/L (ref 0–44)
AST: 17 U/L (ref 15–41)
Albumin: 3.2 g/dL — ABNORMAL LOW (ref 3.5–5.0)
Alkaline Phosphatase: 165 U/L — ABNORMAL HIGH (ref 38–126)
Anion gap: 11 (ref 5–15)
BUN: 16 mg/dL (ref 6–20)
CO2: 24 mmol/L (ref 22–32)
Calcium: 9.2 mg/dL (ref 8.9–10.3)
Chloride: 100 mmol/L (ref 98–111)
Creatinine, Ser: 1.36 mg/dL — ABNORMAL HIGH (ref 0.44–1.00)
GFR calc Af Amer: 50 mL/min — ABNORMAL LOW (ref >60)
GFR calc non Af Amer: 43 mL/min — ABNORMAL LOW (ref >60)
Glucose, Bld: 478 mg/dL — ABNORMAL HIGH (ref 70–99)
Potassium: 4.1 mmol/L (ref 3.5–5.1)
Sodium: 135 mmol/L (ref 135–145)
Total Bilirubin: 0.7 mg/dL (ref 0.3–1.2)
Total Protein: 6.6 g/dL (ref 6.5–8.1)

## 2019-06-08 LAB — APTT: aPTT: 31 seconds (ref 24–36)

## 2019-06-08 MED ORDER — DULOXETINE HCL 60 MG PO CPEP
60.0000 mg | ORAL_CAPSULE | Freq: Two times a day (BID) | ORAL | Status: DC
  Administered 2019-06-08 – 2019-06-09 (×2): 60 mg via ORAL
  Filled 2019-06-08 (×3): qty 1

## 2019-06-08 MED ORDER — NORTRIPTYLINE HCL 25 MG PO CAPS
25.0000 mg | ORAL_CAPSULE | Freq: Every day | ORAL | Status: DC
  Administered 2019-06-08: 25 mg via ORAL
  Filled 2019-06-08 (×2): qty 1

## 2019-06-08 MED ORDER — LORAZEPAM 2 MG/ML IJ SOLN: 2.0000 mg | Freq: Once | INTRAMUSCULAR | Status: DC

## 2019-06-08 MED ORDER — SODIUM CHLORIDE 0.9 % IV SOLN: INTRAVENOUS | Status: DC

## 2019-06-08 MED ORDER — ASPIRIN 81 MG PO CHEW
324.0000 mg | CHEWABLE_TABLET | Freq: Once | ORAL | Status: AC
  Administered 2019-06-08: 324 mg via ORAL
  Filled 2019-06-08: qty 4

## 2019-06-08 MED ORDER — INSULIN GLARGINE 100 UNIT/ML ~~LOC~~ SOLN
140.0000 [IU] | Freq: Every day | SUBCUTANEOUS | Status: DC
  Administered 2019-06-09: 140 [IU] via SUBCUTANEOUS
  Filled 2019-06-08 (×2): qty 1.4

## 2019-06-08 MED ORDER — ACETAMINOPHEN 325 MG PO TABS
650.0000 mg | ORAL_TABLET | ORAL | Status: DC | PRN
  Administered 2019-06-09: 650 mg via ORAL
  Filled 2019-06-08: qty 2

## 2019-06-08 MED ORDER — LORAZEPAM 2 MG/ML IJ SOLN
2.0000 mg | Freq: Once | INTRAMUSCULAR | Status: AC
  Administered 2019-06-08: 2 mg via INTRAVENOUS
  Filled 2019-06-08: qty 1

## 2019-06-08 MED ORDER — INSULIN ASPART 100 UNIT/ML ~~LOC~~ SOLN: 0.0000 [IU] | Freq: Every day | SUBCUTANEOUS | Status: DC

## 2019-06-08 MED ORDER — IOHEXOL 350 MG/ML SOLN
60.0000 mL | Freq: Once | INTRAVENOUS | Status: AC | PRN
  Administered 2019-06-08: 60 mL via INTRAVENOUS

## 2019-06-08 MED ORDER — ATORVASTATIN CALCIUM 40 MG PO TABS
40.0000 mg | ORAL_TABLET | Freq: Every day | ORAL | Status: DC
  Administered 2019-06-08: 40 mg via ORAL
  Filled 2019-06-08: qty 1

## 2019-06-08 MED ORDER — STROKE: EARLY STAGES OF RECOVERY BOOK
Freq: Once | Status: AC
  Filled 2019-06-08 (×2): qty 1

## 2019-06-08 MED ORDER — INSULIN ASPART 100 UNIT/ML ~~LOC~~ SOLN: 10.0000 [IU] | Freq: Once | SUBCUTANEOUS | Status: DC

## 2019-06-08 MED ORDER — HEPARIN SODIUM (PORCINE) 5000 UNIT/ML IJ SOLN
5000.0000 [IU] | Freq: Three times a day (TID) | INTRAMUSCULAR | Status: DC
  Administered 2019-06-08 – 2019-06-09 (×2): 5000 [IU] via SUBCUTANEOUS
  Filled 2019-06-08 (×2): qty 1

## 2019-06-08 MED ORDER — ACETAMINOPHEN 650 MG RE SUPP: 650.0000 mg | RECTAL | Status: DC | PRN

## 2019-06-08 MED ORDER — HYDRALAZINE HCL 25 MG PO TABS: 25.0000 mg | ORAL_TABLET | Freq: Four times a day (QID) | ORAL | Status: DC | PRN

## 2019-06-08 MED ORDER — FERROUS GLUCONATE 324 (38 FE) MG PO TABS
324.0000 mg | ORAL_TABLET | Freq: Two times a day (BID) | ORAL | Status: DC
  Administered 2019-06-09: 324 mg via ORAL
  Filled 2019-06-08 (×2): qty 1

## 2019-06-08 MED ORDER — SENNOSIDES-DOCUSATE SODIUM 8.6-50 MG PO TABS: 1.0000 | ORAL_TABLET | Freq: Every evening | ORAL | Status: DC | PRN

## 2019-06-08 MED ORDER — HYDROCODONE-ACETAMINOPHEN 10-325 MG PO TABS
1.0000 | ORAL_TABLET | Freq: Four times a day (QID) | ORAL | Status: DC | PRN
  Administered 2019-06-09: 1 via ORAL
  Filled 2019-06-08: qty 1

## 2019-06-08 MED ORDER — ASPIRIN EC 81 MG PO TBEC
81.0000 mg | DELAYED_RELEASE_TABLET | Freq: Every day | ORAL | Status: DC
  Administered 2019-06-09: 81 mg via ORAL
  Filled 2019-06-08: qty 1

## 2019-06-08 MED ORDER — INSULIN ASPART 100 UNIT/ML ~~LOC~~ SOLN
0.0000 [IU] | Freq: Three times a day (TID) | SUBCUTANEOUS | Status: DC
  Administered 2019-06-09: 3 [IU] via SUBCUTANEOUS
  Administered 2019-06-09: 5 [IU] via SUBCUTANEOUS

## 2019-06-08 MED ORDER — PANTOPRAZOLE SODIUM 40 MG PO TBEC
40.0000 mg | DELAYED_RELEASE_TABLET | Freq: Every day | ORAL | Status: DC
  Administered 2019-06-09: 40 mg via ORAL
  Filled 2019-06-08: qty 1

## 2019-06-08 MED ORDER — ACETAMINOPHEN 160 MG/5ML PO SOLN: 650.0000 mg | ORAL | Status: DC | PRN

## 2019-06-08 NOTE — ED Notes (Signed)
Pt transported to MRI 

## 2019-06-08 NOTE — ED Triage Notes (Signed)
Pt reports having numbness/tingling sensation to right arm,leg and lower lip that started approx 1 hour ago after eating lunch. Had similar episode on Saturday night but it resolved by morning. Grips are equal, no arm drift, no facial droop noted.

## 2019-06-08 NOTE — Consult Note (Addendum)
NEURO HOSPITALIST CONSULT NOTE   Requestig physician: Dr. Wilson Singer  Reason for Consult: Acute onset of right sided sensory numbness  History obtained from:  Patient and Chart     HPI:                                                                                                                                          [name] is an 59 y.o. female presenting to the ED with acute onset of right sided sensory numbness and paresthesias along with vertigo described as an unsteady sensation. She had one such transient episode on Saturday - it lasted for 2 hours and continued as the patient went to sleep, but had completely resolved when she woke up about 4 hours later. Today her symptoms recurred, while eating lunch, in identical fashion to Saturday's symptoms. The patient in Triage endorsed having numbness/tingling sensation to her right arm, leg and lower lip that started approx 1 hour prior to presenting, with symptoms starting after eating lunch.  She denies any other symptoms, including chest pain, trouble breathing, headache and abdominal pain.   Past Medical History:  Diagnosis Date  . Abscess    increased drainage from abscess on buttock  . Anal fistula   . Anxiety   . Bilateral hip pain 05/27/2015  . Chronic pain syndrome 05/27/2015  . Depression    sees [name]  . Diabetes mellitus without complication ([place])   . Hypertension   . Sleep apnea    2008- sleep study, neg. for sleep apnea   . SVT (supraventricular tachycardia) ([place])   . Symptomatic cholelithiasis 09/11/2018    Past Surgical History:  Procedure Laterality Date  . ANAL EXAMINATION UNDER ANESTHESIA  02/21/11   anal fistula  . BREAST SURGERY  patient does not remember date of procedure   pull fluid off lft br  . CHOLECYSTECTOMY N/A 09/12/2018   Procedure: LAPAROSCOPIC CHOLECYSTECTOMY;  Surgeon: Stark Klein, MD;  Location: [facility];  Service: General;  Laterality: N/A;  . ELECTROPHYSIOLOGIC  STUDY N/A 05/05/2015   Procedure: SVT Ablation;  Surgeon: Will Meredith Leeds, MD;  Location: [facility];  Service: Cardiovascular;  Laterality: N/A;  . EP IMPLANTABLE DEVICE N/A 01/30/2016   Procedure: Loop Recorder Insertion;  Surgeon: Sanda Klein, MD;  Location: [facility];  Service: Cardiovascular;  Laterality: N/A;  . INCISE AND DRAIN ABCESS     abscess on right thigh and buttock  . KNEE ARTHROSCOPY     left  . LAPAROSCOPIC APPENDECTOMY N/A 09/19/2018   Procedure: APPENDECTOMY LAPAROSCOPIC;  Surgeon: Clovis Riley, MD;  Location: [facility];  Service: General;  Laterality: N/A;  . SHOULDER SURGERY  04/14/09   right    Family History  Problem Relation Age of Onset  .  Hypertension Mother   . Alzheimer's disease Mother   . Diabetes Father   . Breast cancer Sister 21  . Anesthesia problems Neg Hx   . Hypotension Neg Hx   . Malignant hyperthermia Neg Hx   . Pseudochol deficiency Neg Hx   . Colon cancer Neg Hx   . Esophageal cancer Neg Hx   . Stomach cancer Neg Hx   . Rectal cancer Neg Hx   . Thyroid disease Neg Hx               Social History:  reports that she has never smoked. She has never used smokeless tobacco. She reports that she does not drink alcohol or use drugs.  Allergies  Allergen Reactions  . Tramadol Nausea Only    HOME MEDICATIONS:                                                                                                                      No current facility-administered medications on file prior to encounter.   Current Outpatient Medications on File Prior to Encounter  Medication Sig Dispense Refill  . atorvastatin (LIPITOR) 20 MG tablet Take 1 tablet (20 mg total) by mouth at bedtime. 90 tablet 1  . carvedilol (COREG) 6.25 MG tablet Take 1 tablet (6.25 mg total) by mouth 2 (two) times daily. 180 tablet 0  . dexlansoprazole (DEXILANT) 60 MG capsule Take 1 capsule (60 mg total) by mouth daily. 90 capsule 0  . diclofenac (VOLTAREN)  50 MG EC tablet Take 1 tablet by mouth twice daily 180 tablet 2  . DULoxetine (CYMBALTA) 60 MG capsule Take 1 capsule (60 mg total) by mouth 2 (two) times daily. 180 capsule 0  . empagliflozin (JARDIANCE) 10 MG TABS tablet Take 10 mg by mouth daily before breakfast. 90 tablet 1  . FARXIGA 5 MG TABS tablet Take 5 mg by mouth every morning. 90 tablet 0  . ferrous gluconate (FERGON) 324 MG tablet Take 1 tablet (324 mg total) by mouth 2 (two) times daily with a meal. 90 tablet 0  . furosemide (LASIX) 80 MG tablet Take 1 tablet (80 mg total) by mouth daily. 90 tablet 0  . HYDROcodone-acetaminophen (NORCO) 10-325 MG tablet Take 1 tablet by mouth every 6 (six) hours as needed. Please do not  Fill before 05/30/2019 120 tablet 0  . Insulin Glargine (BASAGLAR KWIKPEN) 100 UNIT/ML SOPN Inject 1.4 mLs (140 Units total) into the skin every morning. And pen needles 1/day 50 pen 0  . LORazepam (ATIVAN) 0.5 MG tablet Take 1 tablet (0.5 mg total) by mouth 2 (two) times daily as needed. for anxiety 60 tablet 0  . nortriptyline (PAMELOR) 25 MG capsule Take 1 capsule by mouth at bedtime 30 capsule 2  . potassium chloride (KLOR-CON) 10 MEQ tablet Take 1 tablet (10 mEq total) by mouth daily. 90 tablet 1      ROS:  As per HPI.    Blood pressure (!) 136/97, pulse 89, temperature 98.4 F (36.9 C), temperature source Oral, resp. rate 18, height 5\' 3"  (1.6 m), weight 118.2 kg, last menstrual period 02/09/2017, SpO2 99 %.   General Examination:                                                                                                       Physical Exam  HEENT-  [place]/AT    Lungs- Respirations unlabored Extremities- No edema  Neurological Examination Mental Status: Alert, fully oriented, thought content appropriate in the context of somewhat anxious affect.  Speech fluent without  evidence of aphasia.  Able to follow all commands without difficulty. No dysarthria.  Cranial Nerves: II: Visual fields intact with no extinction to DSS.  III,IV, VI: PERRL. No ptosis.  V,VII: Smile symmetric, facial temp sensation with hyperesthesia on the right. FT normal bilaterally  VIII: hearing intact to voice IX,X: Palate symmetric XI: Symmetric XII: midline tongue extension Motor: 5/5 x 4 except for subtle 4+/5 weakness of right handgrip and biceps.  Sensory: Decreased FT sensation to RUE and RLE. Hyperesthesia to temperature RUE.  Deep Tendon Reflexes: 2+ and symmetric throughout Plantars: Right: downgoing   Left: downgoing Cerebellar: No ataxia with FNF and H-S bilaterally.  Gait: Deferred   Lab Results: Basic Metabolic Panel: Recent Labs  Lab 06/08/19 1454  NA 135  K 3.9  CL 101  GLUCOSE 469*  BUN 20  CREATININE 1.20*    CBC: Recent Labs  Lab 06/08/19 1437 06/08/19 1454  WBC 13.8*  --   NEUTROABS 10.1*  --   HGB 12.5 14.3  HCT 42.0 42.0  MCV 75.8*  --   PLT 427*  --     Cardiac Enzymes: No results for input(s): CKTOTAL, CKMB, CKMBINDEX, TROPONINI in the last 168 hours.  Lipid Panel: No results for input(s): CHOL, TRIG, HDL, CHOLHDL, VLDL, LDLCALC in the last 168 hours.  Imaging: CT HEAD CODE STROKE WO CONTRAST  Result Date: 06/08/2019 CLINICAL DATA:  Code stroke. Neuro deficit, acute, stroke suspected. Additional history provided: Last known normal 1330, face numbness, arm numbness. EXAM: CT HEAD WITHOUT CONTRAST TECHNIQUE: Contiguous axial images were obtained from the base of the skull through the vertex without intravenous contrast. COMPARISON:  Head CT 03/21/2011 FINDINGS: Brain: There is no evidence of acute intracranial hemorrhage, intracranial mass, midline shift or extra-axial fluid collection.No demarcated cortical infarction. Cerebral volume is normal for age. Partially empty sella turcica. Vascular: No hyperdense vessel.  Atherosclerotic  calcifications. Skull: Normal. Negative for fracture or focal lesion. Sinuses/Orbits: Visualized orbits demonstrate no acute abnormality. No significant paranasal sinus disease or mastoid effusion at the imaged levels. IMPRESSION: No evidence of acute intracranial abnormality. Electronically Signed   By: Kellie Simmering DO   On: 06/08/2019 14:54    Assessment: 59 year old female with acute onset of right sided sensory numbness.  1. CT head is negative for acute intracranial abnormality.  2. Exam reveals subtle weakness of RUE relative to the left, right sided hyperesthesia to temperature and right  hypoesthesia to fine touch.  3. Exam findings may localize to the left thalamus. Also on the DDx would be anxiety related symptoms or other psychogenic etiology.  4. Sleep apnea, DM, morbid obesity and HTN.  5. NIHSS = 1.  6. Risks of IV tPA significantly outweigh potential benefits given mild symptoms and signs. Discussed with patient who expressed understanding and agreement with the plan.  7. Severe hyperglycemia.   Recommendations: 1. MRI brain without contrast. Also obtain MRA head.  2. Frequent neuro checks 3. Start ASA 81 mg po qd 4. Carotid ultrasound 5. TTE 6. PT/OT/Speech 7. Consider starting a statin after obtaining a baseline CK level.  8. Blood glucose management.  9. Permissive HTN protocol for 24 hours. Treat if SBP > 220.   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 -- MRA shows no antegrade flow in the right vertebral artery at the foramen magnum. Left vertebral artery widely patent to the basilar. Retrograde flow back to right PICA. Compromise supply to the right PCA, which receives flow from a narrow and irregular posterior communicating artery and a severely diseased P1 segment from the basilar tip. -- Discussed with the Hospitalist service. A CTA of head and neck has been recommended.  -- ASA 325 mg po load x 1 followed by 81  mg po qd has been discussed with the EDP.  -- Stroke team to follow in the AM  Electronically signed: Dr. Kerney Elbe 06/08/2019, 3:44 PM

## 2019-06-08 NOTE — Code Documentation (Signed)
59 yo female coming from lunch where she was noted to have a sudden onset of right arm, leg, and face numbness. Pt had an episode on Saturday that lasted for two hours. She took a nap and when she woke up it was gone.   Pt arrived to the ED and initially complained of potential allergic reaction. When she was triaged, RN noted that she complained of sudden onset of numbness and activated a Code Stroke. Stroke Team met patient in CT. Initial NIHSS 1 due to right face, arm, and leg decreased sensation. See Stroke Timeline for details.   CT Head completed. Pt taken back to [place] [facility] on cardiac monitor. Pt is too mild to treat at this point. Remains in the window until 1800. Handoff given to [employer], Therapist, sports.

## 2019-06-08 NOTE — ED Notes (Signed)
Pt to CT at this time.

## 2019-06-08 NOTE — ED Notes (Signed)
Pt returned from MRI at this time

## 2019-06-08 NOTE — ED Provider Notes (Signed)
MSE was initiated and I personally evaluated the patient and placed orders (if any) at  2:37 PM on May 21, 2353.  59 year old female presenting to the ED today with sudden onset, constant, decreased sensation/tingling to right side that occurred 1 hour ago. Pt reports similar episodes 3 days ago which lasted approximately 1 hour before dissipating. She did not think much of it until this afternoon while at lunch with friends. No hx of stoke in the past. Denies weakness/numbness, headache, vision changes, chest pain.   Physical Exam  Constitutional: She is oriented to person, place, and time and well-developed, well-nourished, and in no distress.  HENT:  Head: Normocephalic and atraumatic.  Eyes: Pupils are equal, round, and reactive to light. Conjunctivae and EOM are normal.  Cardiovascular: Normal rate and regular rhythm.  Pulmonary/Chest: Effort normal. No respiratory distress. She has no wheezes. She has no rales.  Abdominal: Soft. Bowel sounds are normal. There is no abdominal tenderness. There is no rebound and no guarding.  Neurological: She is alert and oriented to person, place, and time.  No facial droop appreciated.  A&O x4 GCS 15 Decreased sensation to right side of face, RUE, and RLE. Strength 4/5 on right compared to left.  Coordination with finger-to-nose WNL Neg romberg, neg pronator drift  Nursing note and vitals reviewed.  59 year old female who presents to the ED with tingling sensation to entire right side. Decreased sensation on exam and diminished strength to right side. LKN 1 hour ago. CODE STROKE initiated and pt taken to CT scan.   The patient appears stable so that the remainder of the MSE may be completed by another provider.   Eustaquio Maize, PA-C 06/08/19 1443    Blanchie Dessert, MD 06/10/19 [phone]

## 2019-06-08 NOTE — Progress Notes (Signed)
Pt reports having [employer] Loop Recorder placed in 2017. Pt to have MRI.
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
## 2019-06-08 NOTE — H&P (Signed)
History and Physical    [name] [id number] DOB: [birth year] DOA: 06/08/2019  PCP: Carlena Hurl, PA-C   Patient coming from: home  I have personally briefly reviewed patient's old medical records in [place]  Chief Complaint: Right sided numbness  HPI: [name] is a 59 y.o. female with medical history significant of IDDM with resistance, hyperlipidemia, anxiety depression, presented with new onset of right-sided weakness.  Her symptoms started about 1 hour ago, with sudden onset of right-sided tingling and numbness persistent, denied any weakness or pain, no headache no blurry vision.  Also started to feel lightheaded with "woozy" feeling. She reported had one such transient episode on Saturday - it lasted for 2 hours and continued as the patient went to sleep, but had completely resolved when she woke up about 4 hours later. Today her symptoms persisted.  Denied any blurry vision, no headache, no fever chills, no neck pain. 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 Left vertebral artery widely patent to the basilar. Retrograde flow back to right PICA. Compromise supply to the right PCA, which receives flow from a narrow and irregular posterior communicating artery and a severely diseased P1 segment from the basilar tip. 12 mm meningioma at the right frontal vertex without significant mass-effect upon the brain.  Review of Systems: As per HPI otherwise 10 point review of systems negative.    Past Medical History:  Diagnosis Date   Abscess    increased drainage from abscess on buttock   Anal fistula    Anxiety    Bilateral hip pain 05/27/2015   Chronic pain syndrome 05/27/2015   Depression    sees [name]   Diabetes mellitus without complication [facility])    Hypertension    Sleep apnea    2008- sleep study, neg.  for sleep apnea    SVT (supraventricular tachycardia) ([place])    Symptomatic cholelithiasis 09/11/2018    Past Surgical History:  Procedure Laterality Date   ANAL EXAMINATION UNDER ANESTHESIA  02/21/11   anal fistula   BREAST SURGERY  patient does not remember date of procedure   pull fluid off lft br   CHOLECYSTECTOMY N/A 09/12/2018   Procedure: LAPAROSCOPIC CHOLECYSTECTOMY;  Surgeon: Stark Klein, MD;  Location: [place];  Service: General;  Laterality: N/A;   ELECTROPHYSIOLOGIC STUDY N/A 05/05/2015   Procedure: SVT Ablation;  Surgeon: Will Meredith Leeds, MD;  Location: [place] [facility];  Service: Cardiovascular;  Laterality: N/A;   EP IMPLANTABLE DEVICE N/A 01/30/2016   Procedure: Loop Recorder Insertion;  Surgeon: Sanda Klein, MD;  Location: [place] [facility];  Service: Cardiovascular;  Laterality: N/A;   INCISE AND DRAIN ABCESS     abscess on right thigh and buttock   KNEE ARTHROSCOPY     left   LAPAROSCOPIC APPENDECTOMY N/A 09/19/2018   Procedure: APPENDECTOMY LAPAROSCOPIC;  Surgeon: Clovis Riley, MD;  Location: [place] [facility];  Service: General;  Laterality: N/A;   SHOULDER SURGERY  04/14/09   right     reports that she has never smoked. She has never used smokeless tobacco. She reports that she does not drink alcohol or use drugs.  Allergies  Allergen Reactions   Tramadol Nausea Only    Family History  Problem Relation Age of Onset   Hypertension Mother    Alzheimer's disease Mother    Diabetes Father  Breast cancer Sister 59   Anesthesia problems Neg Hx    Hypotension Neg Hx    Malignant hyperthermia Neg Hx    Pseudochol deficiency Neg Hx    Colon cancer Neg Hx    Esophageal cancer Neg Hx    Stomach cancer Neg Hx    Rectal cancer Neg Hx    Thyroid disease Neg Hx      Prior to Admission medications   Medication Sig Start Date End Date Taking? Authorizing Provider  atorvastatin (LIPITOR) 20 MG tablet Take 1 tablet (20 mg total) by mouth  at bedtime. 04/17/19  Yes Tysinger, Camelia Eng, PA-C  carvedilol (COREG) 6.25 MG tablet Take 1 tablet (6.25 mg total) by mouth 2 (two) times daily. 04/17/19 04/16/20 Yes Tysinger, Camelia Eng, PA-C  dexlansoprazole (DEXILANT) 60 MG capsule Take 1 capsule (60 mg total) by mouth daily. 04/17/19  Yes Tysinger, Camelia Eng, PA-C  diclofenac (VOLTAREN) 50 MG EC tablet Take 1 tablet by mouth twice daily 04/20/19  Yes Meredith Staggers, MD  DULoxetine (CYMBALTA) 60 MG capsule Take 1 capsule (60 mg total) by mouth 2 (two) times daily. 04/17/19  Yes Tysinger, Camelia Eng, PA-C  empagliflozin (JARDIANCE) 10 MG TABS tablet Take 10 mg by mouth daily before breakfast. 04/20/19  Yes Tysinger, Camelia Eng, PA-C  FARXIGA 5 MG TABS tablet Take 5 mg by mouth every morning. 05/12/19  Yes Tysinger, Camelia Eng, PA-C  ferrous gluconate (FERGON) 324 MG tablet Take 1 tablet (324 mg total) by mouth 2 (two) times daily with a meal. 05/12/19  Yes Tysinger, Camelia Eng, PA-C  furosemide (LASIX) 80 MG tablet Take 1 tablet (80 mg total) by mouth daily. 04/23/19  Yes Tysinger, Camelia Eng, PA-C  HYDROcodone-acetaminophen (NORCO) 10-325 MG tablet Take 1 tablet by mouth every 6 (six) hours as needed. Please do not  Fill before 05/30/2019 Patient taking differently: Take 1 tablet by mouth every 6 (six) hours as needed for moderate pain. Please do not  Fill before 05/30/2019 05/04/19  Yes Bayard Hugger, NP  Insulin Glargine (BASAGLAR KWIKPEN) 100 UNIT/ML SOPN Inject 1.4 mLs (140 Units total) into the skin every morning. And pen needles 1/day 04/17/19  Yes Tysinger, Camelia Eng, PA-C  nortriptyline (PAMELOR) 25 MG capsule Take 1 capsule by mouth at bedtime 04/20/19  Yes Danella Sensing L, NP  potassium chloride (KLOR-CON) 10 MEQ tablet Take 1 tablet (10 mEq total) by mouth daily. 04/23/19  Yes Tysinger, Camelia Eng, PA-C  LORazepam (ATIVAN) 0.5 MG tablet Take 1 tablet (0.5 mg total) by mouth 2 (two) times daily as needed. for anxiety Patient not taking: Reported on 06/08/2019 02/02/19   Carlena Hurl, PA-C    Physical Exam: Vitals:   06/08/19 1530 06/08/19 1545 06/08/19 1600 06/08/19 1900  BP: (!) 136/97 (!) 148/103 (!) 141/74 112/71  Pulse: 89 85 82 87  Resp: 18 17 17 16   Temp:      TempSrc:      SpO2: 99% 98% 98% 98%  Weight:      Height:        Constitutional: NAD, calm, comfortable Vitals:   06/08/19 1530 06/08/19 1545 06/08/19 1600 06/08/19 1900  BP: (!) 136/97 (!) 148/103 (!) 141/74 112/71  Pulse: 89 85 82 87  Resp: 18 17 17 16   Temp:      TempSrc:      SpO2: 99% 98% 98% 98%  Weight:      Height:       Eyes: PERRL, lids  and conjunctivae normal ENMT: Mucous membranes are moist. Posterior pharynx clear of any exudate or lesions.Normal dentition.  Neck: normal, supple, no masses, no thyromegaly Respiratory: clear to auscultation bilaterally, no wheezing, no crackles. Normal respiratory effort. No accessory muscle use.  Cardiovascular: Regular rate and rhythm, no murmurs / rubs / gallops. No extremity edema. 2+ pedal pulses. No carotid bruits.  Abdomen: no tenderness, no masses palpated. No hepatosplenomegaly. Bowel sounds positive.  Musculoskeletal: no clubbing / cyanosis. No joint deformity upper and lower extremities. Good ROM, no contractures. Normal muscle tone.  Skin: no rashes, lesions, ulcers. No induration Neurologic: No facial droop, CN 2-12 grossly intact. DTR normal. Strength 5/5 in all 4.  Decrease touch sensation on the right side arm and legs Psychiatric: Normal judgment and insight. Alert and oriented x 3. Normal mood.     Labs on Admission: I have personally reviewed following labs and imaging studies  CBC: Recent Labs  Lab 06/08/19 1437 06/08/19 1454  WBC 13.8*  --   NEUTROABS 10.1*  --   HGB 12.5 14.3  HCT 42.0 42.0  MCV 75.8*  --   PLT 427*  --    Basic Metabolic Panel: Recent Labs  Lab 06/08/19 1437 06/08/19 1454  NA 135 135  K 4.1 3.9  CL 100 101  CO2 24  --   GLUCOSE 478* 469*  BUN 16 20  CREATININE 1.36* 1.20*    CALCIUM 9.2  --    GFR: Estimated Creatinine Clearance: 63.5 mL/min (A) (by C-G formula based on SCr of 1.2 mg/dL (H)). Liver Function Tests: Recent Labs  Lab 06/08/19 1437  AST 17  ALT 17  ALKPHOS 165*  BILITOT 0.7  PROT 6.6  ALBUMIN 3.2*   No results for input(s): LIPASE, AMYLASE in the last 168 hours. No results for input(s): AMMONIA in the last 168 hours. Coagulation Profile: Recent Labs  Lab 06/08/19 1437  INR 1.0   Cardiac Enzymes: No results for input(s): CKTOTAL, CKMB, CKMBINDEX, TROPONINI in the last 168 hours. BNP (last 3 results) No results for input(s): PROBNP in the last 8760 hours. HbA1C: No results for input(s): HGBA1C in the last 72 hours. CBG: Recent Labs  Lab 06/08/19 1800  GLUCAP 290*   Lipid Profile: No results for input(s): CHOL, HDL, LDLCALC, TRIG, CHOLHDL, LDLDIRECT in the last 72 hours. Thyroid Function Tests: No results for input(s): TSH, T4TOTAL, FREET4, T3FREE, THYROIDAB in the last 72 hours. Anemia Panel: No results for input(s): VITAMINB12, FOLATE, FERRITIN, TIBC, IRON, RETICCTPCT in the last 72 hours. Urine analysis:    Component Value Date/Time   COLORURINE YELLOW 09/19/2018 0607   APPEARANCEUR CLEAR 09/19/2018 0607   APPEARANCEUR Clear 04/12/2017 1647   LABSPEC 1.031 (H) 09/19/2018 0607   PHURINE 6.0 09/19/2018 0607   GLUCOSEU >=500 (A) 09/19/2018 0607   HGBUR NEGATIVE 09/19/2018 0607   BILIRUBINUR NEGATIVE 09/19/2018 0607   BILIRUBINUR Negative 04/12/2017 [place] 09/19/2018 0607   PROTEINUR NEGATIVE 09/19/2018 0607   UROBILINOGEN 0.2 04/12/2017 1641   NITRITE NEGATIVE 09/19/2018 0607   LEUKOCYTESUR TRACE (A) 09/19/2018 0607    Radiological Exams on Admission: MR ANGIO HEAD WO CONTRAST  Result Date: 06/08/2019 CLINICAL DATA:  Acute onset of right-sided numbness and paresthesia. EXAM: MRI HEAD WITHOUT CONTRAST MRA HEAD WITHOUT CONTRAST TECHNIQUE: Multiplanar, multiecho pulse sequences of the brain and  surrounding structures were obtained without intravenous contrast. Angiographic images of the head were obtained using MRA technique without contrast. COMPARISON:  Head CT same day FINDINGS:  MRI HEAD FINDINGS Brain: Diffusion imaging shows acute infarction in the left para median pons. No other acute infarction. Elsewhere, cerebellum appears normal. Cerebral hemispheres show very minimal small vessel change of the white matter. No cortical or large vessel territory infarction. No intra-axial mass lesion, hemorrhage, hydrocephalus or extra-axial collection. There is a right frontal vertex meningioma measuring 12 mm in diameter without mass effect upon the brain. Vascular: Major vessels at the base of the brain show flow. Skull and upper cervical spine: Negative Sinuses/Orbits: Clear/normal Other: None MRA HEAD FINDINGS Both internal carotid arteries are patent through the skull base and siphon regions. The anterior and middle cerebral vessels are patent without proximal stenosis, aneurysm or vascular malformation. Dominant left vertebral artery is widely patent to the basilar. No antegrade flow is seen in the right vertebral artery at the foramen magnum level. There is retrograde flow back to right PICA. Mild atherosclerotic irregularity of the basilar artery. Superior cerebellar arteries show flow but demonstrate atherosclerotic irregularity. Right PCA shows flow, receiving supply from a narrow irregular posterior communicating artery and from a severely disease right P1 segment from the basilar tip. 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 Otherwise, the brain only shows minimal small vessel change of the cerebral hemispheric white matter. 12 mm meningioma at the right frontal vertex without significant mass-effect upon the brain. No antegrade flow in the right vertebral artery at the foramen magnum. Left vertebral artery widely patent to the basilar. Retrograde flow back to right PICA. Compromise  supply to the right PCA, which receives flow from a narrow and irregular posterior communicating artery and a severely diseased P1 segment from the basilar tip. Electronically Signed   By: Nelson Chimes M.D.   On: 06/08/2019 16:52   MR BRAIN WO CONTRAST  Result Date: 06/08/2019 CLINICAL DATA:  Acute onset of right-sided numbness and paresthesia. EXAM: MRI HEAD WITHOUT CONTRAST MRA HEAD WITHOUT CONTRAST TECHNIQUE: Multiplanar, multiecho pulse sequences of the brain and surrounding structures were obtained without intravenous contrast. Angiographic images of the head were obtained using MRA technique without contrast. COMPARISON:  Head CT same day FINDINGS: MRI HEAD FINDINGS Brain: Diffusion imaging shows acute infarction in the left para median pons. No other acute infarction. Elsewhere, cerebellum appears normal. Cerebral hemispheres show very minimal small vessel change of the white matter. No cortical or large vessel territory infarction. No intra-axial mass lesion, hemorrhage, hydrocephalus or extra-axial collection. There is a right frontal vertex meningioma measuring 12 mm in diameter without mass effect upon the brain. Vascular: Major vessels at the base of the brain show flow. Skull and upper cervical spine: Negative Sinuses/Orbits: Clear/normal Other: None MRA HEAD FINDINGS Both internal carotid arteries are patent through the skull base and siphon regions. The anterior and middle cerebral vessels are patent without proximal stenosis, aneurysm or vascular malformation. Dominant left vertebral artery is widely patent to the basilar. No antegrade flow is seen in the right vertebral artery at the foramen magnum level. There is retrograde flow back to right PICA. Mild atherosclerotic irregularity of the basilar artery. Superior cerebellar arteries show flow but demonstrate atherosclerotic irregularity. Right PCA shows flow, receiving supply from a narrow irregular posterior communicating artery and from a  severely disease right P1 segment from the basilar tip. 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 Otherwise, the brain only shows minimal small vessel change of the cerebral hemispheric white matter. 12 mm meningioma at the right frontal vertex without significant mass-effect upon the brain. No  antegrade flow in the right vertebral artery at the foramen magnum. Left vertebral artery widely patent to the basilar. Retrograde flow back to right PICA. Compromise supply to the right PCA, which receives flow from a narrow and irregular posterior communicating artery and a severely diseased P1 segment from the basilar tip. Electronically Signed   By: Nelson Chimes M.D.   On: 06/08/2019 16:52   CT HEAD CODE STROKE WO CONTRAST  Result Date: 06/08/2019 CLINICAL DATA:  Code stroke. Neuro deficit, acute, stroke suspected. Additional history provided: Last known normal 1330, face numbness, arm numbness. EXAM: CT HEAD WITHOUT CONTRAST TECHNIQUE: Contiguous axial images were obtained from the base of the skull through the vertex without intravenous contrast. COMPARISON:  Head CT 03/21/2011 FINDINGS: Brain: There is no evidence of acute intracranial hemorrhage, intracranial mass, midline shift or extra-axial fluid collection.No demarcated cortical infarction. Cerebral volume is normal for age. Partially empty sella turcica. Vascular: No hyperdense vessel.  Atherosclerotic calcifications. Skull: Normal. Negative for fracture or focal lesion. Sinuses/Orbits: Visualized orbits demonstrate no acute abnormality. No significant paranasal sinus disease or mastoid effusion at the imaged levels. IMPRESSION: No evidence of acute intracranial abnormality. Electronically Signed   By: Kellie Simmering DO   On: 06/08/2019 14:54    EKG: Independently reviewed.   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  Code stroke triggered, patient was arrived by neurologist, neurology evaluation TPA risk outweigh benefit given patient's mild symptoms. Given the significant finding on MRI, discussed with on-call neurologist Dr. Earnestine Leys, recommend CTA of head and neck Aspirin and statin Echocardiogram, PT OT, speech evaluation. Allow permissive hypertension for 2 to 3 days, as needed hydralazine  Meningioma Accidental finding, likely will need periodic repeat imaging study and outpatient neurology follow-up  Elevated blood pressure As above, patient reported never had elevated blood pressure before  Leukocytosis No fever no cough no dysuria, no diarrhea, probably reactive to stroke/stress, monitor off antibiotics  IDDM insulin resistance Hold all p.o. medications due to patient receiving multiple injections of IV contrast today Continue Lantus and add sliding scale.  Metabolic syndrome As above  CKD stage II Slow hydration for kidney protection, repeat BMP in the morning.   DVT prophylaxis: Heparin subcu Code Status: Full code Family Communication: None at bedside Disposition Plan: Likely can be discharged in the next 24 hours, given her relative mild symptoms does not look like warrantee any inpatient rehab Consults called: Dr. Cheral Marker Admission status: Telemetry observation   Lequita Halt MD [facility] Pager [phone]  06/08/2019, 7:13 PM

## 2019-06-09 ENCOUNTER — Observation Stay (HOSPITAL_BASED_OUTPATIENT_CLINIC_OR_DEPARTMENT_OTHER): Payer: BC Managed Care – PPO

## 2019-06-09 ENCOUNTER — Encounter (HOSPITAL_COMMUNITY): Payer: Self-pay | Admitting: Internal Medicine

## 2019-06-09 DIAGNOSIS — E1165 Type 2 diabetes mellitus with hyperglycemia: Secondary | ICD-10-CM | POA: Diagnosis not present

## 2019-06-09 DIAGNOSIS — I6389 Other cerebral infarction: Secondary | ICD-10-CM | POA: Diagnosis not present

## 2019-06-09 DIAGNOSIS — D329 Benign neoplasm of meninges, unspecified: Secondary | ICD-10-CM | POA: Diagnosis present

## 2019-06-09 DIAGNOSIS — I10 Essential (primary) hypertension: Secondary | ICD-10-CM | POA: Diagnosis present

## 2019-06-09 LAB — URINALYSIS, ROUTINE W REFLEX MICROSCOPIC
Bacteria, UA: NONE SEEN
Bilirubin Urine: NEGATIVE
Glucose, UA: >=500 mg/dL — AB
Hgb urine dipstick: NEGATIVE
Ketones, ur: NEGATIVE mg/dL
Leukocytes,Ua: NEGATIVE
Nitrite: NEGATIVE
Protein, ur: NEGATIVE mg/dL
Specific Gravity, Urine: 1.043 — ABNORMAL HIGH (ref 1.005–1.030)
pH: 5 (ref 5.0–8.0)

## 2019-06-09 LAB — LIPID PANEL
Cholesterol: 214 mg/dL — ABNORMAL HIGH (ref 0–200)
HDL: 31 mg/dL — ABNORMAL LOW (ref >40)
LDL Cholesterol: 131 mg/dL — ABNORMAL HIGH (ref 0–99)
Total CHOL/HDL Ratio: 6.9 RATIO
Triglycerides: 260 mg/dL — ABNORMAL HIGH (ref <150)
VLDL: 52 mg/dL — ABNORMAL HIGH (ref 0–40)

## 2019-06-09 LAB — GLUCOSE, CAPILLARY
Glucose-Capillary: 92 mg/dL (ref 70–99)
Glucose-Capillary: 208 mg/dL — ABNORMAL HIGH (ref 70–99)
Glucose-Capillary: 163 mg/dL — ABNORMAL HIGH (ref 70–99)

## 2019-06-09 LAB — ECHOCARDIOGRAM COMPLETE
Height: 63 in
Weight: 4148.18 oz

## 2019-06-09 LAB — RAPID URINE DRUG SCREEN, HOSP PERFORMED
Amphetamines: NOT DETECTED
Barbiturates: NOT DETECTED
Benzodiazepines: POSITIVE — AB
Cocaine: NOT DETECTED
Opiates: POSITIVE — AB
Tetrahydrocannabinol: NOT DETECTED

## 2019-06-09 LAB — SARS CORONAVIRUS 2 (TAT 6-24 HRS): SARS Coronavirus 2: NEGATIVE

## 2019-06-09 LAB — HEMOGLOBIN A1C
Hgb A1c MFr Bld: 11.4 % — ABNORMAL HIGH (ref 4.8–5.6)
Mean Plasma Glucose: 280.48 mg/dL

## 2019-06-09 MED ORDER — ATORVASTATIN CALCIUM 40 MG PO TABS: 40.0000 mg | ORAL_TABLET | Freq: Every day | ORAL | 0 refills | Status: DC

## 2019-06-09 MED ORDER — CLOPIDOGREL BISULFATE 75 MG PO TABS
75.0000 mg | ORAL_TABLET | Freq: Every day | ORAL | Status: DC
  Administered 2019-06-09: 75 mg via ORAL
  Filled 2019-06-09: qty 1

## 2019-06-09 MED ORDER — ASPIRIN 81 MG PO TBEC: 81.0000 mg | DELAYED_RELEASE_TABLET | Freq: Every day | ORAL | Status: DC

## 2019-06-09 MED ORDER — CLOPIDOGREL BISULFATE 75 MG PO TABS: 75.0000 mg | ORAL_TABLET | Freq: Every day | ORAL | 0 refills | Status: AC

## 2019-06-09 NOTE — Discharge Summary (Signed)
Physician Discharge Summary  [name] [id number] DOB: September 15, 1960 DOA: 06/08/2019  PCP: Carlena Hurl, PA-C  Admit date: 06/08/2019 Discharge date: 06/09/2019  Admitted From: home Discharge disposition: home   Recommendations for Outpatient Follow-Up:   1. Follow-up with stroke team as instructed.  Office will contact you for an appointment 2. Follow-up with PCP in 1 to 2 weeks for evaluation of diabetes control   Discharge Diagnosis:   Principal Problem:   CVA (cerebral vascular accident) ([place]) Active Problems:   Uncontrolled diabetes mellitus with complications ([place])   Anxiety   Class 1 obesity with serious comorbidity in adult   Chronic pain syndrome   Meningioma ([place])   Hypertension    Discharge Condition: Improved.  Diet recommendation: Low sodium, heart healthy.  Carbohydrate-modified.  Regular.  Wound care: None.  Code status: Full.   History of Present Illness:   [name] is a 59 y.o. female with medical history significant of IDDM with resistance, hyperlipidemia, anxiety depression, presented to the emergency department on March 29 with new onset of right-sided weakness.  Her symptoms started about 1 hour prior, with sudden onset of right-sided tingling and numbness persistent, denied any weakness or pain, no headache no blurry vision.  Also started to feel lightheaded with "woozy" feeling. She reported had one such transient episode on Saturday - it lasted for 2 hours and continued as the patient went to sleep, but had completely resolved when she woke up about 4 hours later.  On March 29 her symptoms  occurred.  Denied any blurry vision, no headache, no fever chills, no neck pain.  Work-up in the emergency department included CT head negative, MRI showed Acute infarction of the left para median pons, MRA showed No antegrade flow in the right vertebral artery at the foramen magnum. Left vertebral artery widely patent to the  basilar. Retrograde flow back to right PICA. Compromise supply to the right PCA, which receives flow from a narrow and irregular posterior communicating artery and a severely diseased P1 segment from the basilar tip. 12 mm meningioma at the right frontal vertex without significant mass-effect upon the brain.  Admitted for stroke work-up   Hospital Course by Problem:    Right sided paresthesias along with vertigo.  Vertigo resolved on the day of discharge but she continued with some tingling of her right hand and right leg. Secondary to Acute infarction of the left para median pons.  Code stroke triggered, patient was evaluated by neurologist, neurology evaluation TPA risk outweigh benefit given patient's mild symptoms.  MRI reveals acute left paramedial pontine infarct.  Right frontal vertex meningioma without mass-effect. Given the significant finding on MRI, discussed with on-call neurologist Dr. Earnestine Leys, recommend CTA of head and neck.  CTA of the head and neck reveals focal stenosis at the distal right V2 segment at C1-2 level likely representing very slow flow secondary to stenosis, moderate stenosis of proximal right posterior communicating artery, mild atherosclerotic changes of left carotid bifurcation without significant stenosis, diffuse thyroid goiter, mild distal small vessel disease without other significant proximal stenosis, aneurysm or branch vessel occlusion within the circle of Willis. Echo reveals an EF of 60 to 65% with normal left ventricular function, grade 1 diastolic dysfunction no source of embolus.  Patient has a loop recorder placed 3 years ago no AF.  LDL is 131 hemoglobin A1c 11.4..  Evaluated by physical therapy and Occupational Therapy who recommend outpatient PT and OT.  She was started on aspirin  and statin.  Evaluated by the stroke team who recommend Plavix and aspirin for 3 weeks and then aspirin alone as well as an increase in her Lipitor to 40 mg, weight loss.   Follow-up with stroke team as scheduled  Meningioma.  Incidental finding, likely will need periodic repeat imaging study and outpatient neurology follow-up  Elevated blood pressure.  Controlled.  Home medications include Lasix.  Lasix held during hospitalization to allow for permissive hypertension.  Likely resume at discharge  Leukocytosis.  Trending down at discharge. No fever no cough no dysuria, no diarrhea, probably reactive to stroke/stress, monitor off antibiotics  IDDM insulin resistance. All p.o. medications were held due to patient receiving multiple injections of IV contrast today.  Continue with her Lantus in spite of fasting glucose of 92 this morning.  Hemoglobin A1c 13.  Evaluated by diabetes coordinator recommends patient follow-up closely with her outpatient provider for improved diabetes control.  Metabolic syndrome As above  CKD stage II.  Creatinine 1.36 on admission.  Trending down at discharge.  This appears to be close to her baseline.  Follow-up with her PCP.    Medical Consultants:   neurology   Discharge Exam:   Vitals:   06/09/19 0710 06/09/19 0810  BP: 121/84 134/86  Pulse: 77 82  Resp: 17 16  Temp:  98.1 F (36.7 C)  SpO2: 95% 96%   Vitals:   06/09/19 0515 06/09/19 0538 06/09/19 0710 06/09/19 0810  BP:  123/68 121/84 134/86  Pulse: 74  77 82  Resp: 18  17 16   Temp:    98.1 F (36.7 C)  TempSrc:    Oral  SpO2: 95% 95% 95% 96%  Weight:      Height:        General exam: Appears calm and comfortable. obese Respiratory system: Clear to auscultation. Respiratory effort normal. Cardiovascular system: S1 & S2 heard, RRR. No JVD,  rubs, gallops or clicks. No murmurs. Gastrointestinal system: Abdomen is nondistended, soft and nontender. No organomegaly or masses felt. Normal bowel sounds heard. Central nervous system: Alert and oriented. No focal neurological deficits.decreased sensation right leg. Bilateral grip 5/5 bilateral LE strenghth  5/5 Extremities: No clubbing,  or cyanosis. No edema. Skin: No rashes, lesions or ulcers. Psychiatry: Judgement and insight appear normal. Mood & affect appropriate.    The results of significant diagnostics from this hospitalization (including imaging, microbiology, ancillary and laboratory) are listed below for reference.     Procedures and Diagnostic Studies:   CT ANGIO HEAD W OR WO CONTRAST  Result Date: 06/08/2019 CLINICAL DATA:  Left paramedian brainstem infarct. Episode of numbness and tingling sensation to the right upper and lower extremity as well as the lower lip lasting for 1 hour. EXAM: CT ANGIOGRAPHY HEAD AND NECK TECHNIQUE: Multidetector CT imaging of the head and neck was performed using the standard protocol during bolus administration of intravenous contrast. Multiplanar CT image reconstructions and MIPs were obtained to evaluate the vascular anatomy. Carotid stenosis measurements (when applicable) are obtained utilizing NASCET criteria, using the distal internal carotid diameter as the denominator. CONTRAST:  49mL OMNIPAQUE IOHEXOL 350 MG/ML SOLN COMPARISON:  MR head without contrast and MRA head 06/08/2019. CT head without contrast 06/08/2019 FINDINGS: CTA NECK FINDINGS Aortic arch: A 3 vessel arch configuration is present. Atherosclerotic calcifications are present without significant stenosis or aneurysm. Right carotid system: The right common carotid artery is within normal limits. Bifurcation is unremarkable. Mild tortuosity is present in the cervical right ICA without significant stenosis.  Left carotid system: The left common carotid artery is within normal limits. The bifurcation demonstrates some atherosclerotic change without a significant stenosis relative to the more distal vessel. The cervical left ICA is otherwise normal. Vertebral arteries: Atherosclerotic calcifications are present at the origin of the dominant left vertebral artery. There is no significant stenosis at  the origin of the hypoplastic right vertebral artery. The left vertebral artery is within normal limits in the neck. Focal irregularity and stenosis is present at the distal right P2 segment at the C1-2 level. Skeleton: Vertebral body heights and alignment are normal. There straightening of the normal cervical lordosis. Other neck: Diffuse thyroid goiter is again noted without significant interval change. This is followed by ultrasound. The patient has undergone multiple biopsies. Most recent evaluation was 10/28/2018. No follow-up recommended (ref: J Am Coll Radiol. 2015 Feb;12(2): 143-50).There is some mass effect on the thyroid scratched at there is some mass effect on the trachea without significant stenosis. Soft tissues the neck are otherwise unremarkable. Salivary glands are. No significant adenopathy is present. Upper chest: The lung apices are clear. The thoracic inlet is within normal limits. Review of the MIP images confirms the above findings CTA HEAD FINDINGS Anterior circulation: The internal carotid arteries are within normal limits from the high cervical segments through the ICA termini. The A1 and M1 segments are normal. The MCA bifurcations are intact. Mild irregularity is present in the distal ACA and MCA branch vessels without a significant proximal stenosis or occlusion. Posterior circulation: The left vertebral artery is the dominant vessel. The PICA origins are visualized and normal bilaterally. The vertebral artery is normal. The left posterior cerebral artery originates from the basilar tip. The right posterior cerebral artery is of fetal type. There is a stenosis in the proximal right posterior communicating artery. Asymmetric attenuation of distal PCA branch vessels is noted on the right compared to the left. Left PCA branch vessels are within normal limits. Venous sinuses: The dural sinuses are patent. The straight sinus deep cerebral veins are intact. Cortical veins are within normal  limits. No vascular malformations are evident. Anatomic variants: Fetal type right posterior cerebral artery. Review of the MIP images confirms the above findings IMPRESSION: 1. Focal stenosis of the distal right V2 segment at the C1-2 level. This corresponds with lack of antegrade flow seen on the MRA. This likely represents very slow flow secondary to the stenosis. 2. Moderate stenosis in the proximal right posterior communicating artery of the fetal type right PCA. This results in asymmetric attenuation of distal right PCA branch vessels compared to the left. 3. Mild atherosclerotic changes at the left carotid bifurcation without significant stenosis. 4. Diffuse thyroid goiter. This is followed by ultrasound. No follow-up recommended (ref: J Am Coll Radiol. 2015 Feb;12(2): 143-50). 5. Mild tortuosity of the cervical internal carotid arteries bilaterally without significant stenosis. 6. Atherosclerotic changes at the origin of the dominant left vertebral artery without significant stenosis. 7.  Aortic Atherosclerosis (ICD10-I70.0). 8. Mild distal small vessel disease without other significant proximal stenosis, aneurysm, or branch vessel occlusion within the Circle of Willis. Electronically Signed   By: San Morelle M.D.   On: 06/08/2019 21:27   CT ANGIO NECK W OR WO CONTRAST  Result Date: 06/08/2019 CLINICAL DATA:  Left paramedian brainstem infarct. Episode of numbness and tingling sensation to the right upper and lower extremity as well as the lower lip lasting for 1 hour. EXAM: CT ANGIOGRAPHY HEAD AND NECK TECHNIQUE: Multidetector CT imaging of the head  and neck was performed using the standard protocol during bolus administration of intravenous contrast. Multiplanar CT image reconstructions and MIPs were obtained to evaluate the vascular anatomy. Carotid stenosis measurements (when applicable) are obtained utilizing NASCET criteria, using the distal internal carotid diameter as the denominator.  CONTRAST:  69mL OMNIPAQUE IOHEXOL 350 MG/ML SOLN COMPARISON:  MR head without contrast and MRA head 06/08/2019. CT head without contrast 06/08/2019 FINDINGS: CTA NECK FINDINGS Aortic arch: A 3 vessel arch configuration is present. Atherosclerotic calcifications are present without significant stenosis or aneurysm. Right carotid system: The right common carotid artery is within normal limits. Bifurcation is unremarkable. Mild tortuosity is present in the cervical right ICA without significant stenosis. Left carotid system: The left common carotid artery is within normal limits. The bifurcation demonstrates some atherosclerotic change without a significant stenosis relative to the more distal vessel. The cervical left ICA is otherwise normal. Vertebral arteries: Atherosclerotic calcifications are present at the origin of the dominant left vertebral artery. There is no significant stenosis at the origin of the hypoplastic right vertebral artery. The left vertebral artery is within normal limits in the neck. Focal irregularity and stenosis is present at the distal right P2 segment at the C1-2 level. Skeleton: Vertebral body heights and alignment are normal. There straightening of the normal cervical lordosis. Other neck: Diffuse thyroid goiter is again noted without significant interval change. This is followed by ultrasound. The patient has undergone multiple biopsies. Most recent evaluation was 10/28/2018. No follow-up recommended (ref: J Am Coll Radiol. 2015 Feb;12(2): 143-50).There is some mass effect on the thyroid scratched at there is some mass effect on the trachea without significant stenosis. Soft tissues the neck are otherwise unremarkable. Salivary glands are. No significant adenopathy is present. Upper chest: The lung apices are clear. The thoracic inlet is within normal limits. Review of the MIP images confirms the above findings CTA HEAD FINDINGS Anterior circulation: The internal carotid arteries are  within normal limits from the high cervical segments through the ICA termini. The A1 and M1 segments are normal. The MCA bifurcations are intact. Mild irregularity is present in the distal ACA and MCA branch vessels without a significant proximal stenosis or occlusion. Posterior circulation: The left vertebral artery is the dominant vessel. The PICA origins are visualized and normal bilaterally. The vertebral artery is normal. The left posterior cerebral artery originates from the basilar tip. The right posterior cerebral artery is of fetal type. There is a stenosis in the proximal right posterior communicating artery. Asymmetric attenuation of distal PCA branch vessels is noted on the right compared to the left. Left PCA branch vessels are within normal limits. Venous sinuses: The dural sinuses are patent. The straight sinus deep cerebral veins are intact. Cortical veins are within normal limits. No vascular malformations are evident. Anatomic variants: Fetal type right posterior cerebral artery. Review of the MIP images confirms the above findings IMPRESSION: 1. Focal stenosis of the distal right V2 segment at the C1-2 level. This corresponds with lack of antegrade flow seen on the MRA. This likely represents very slow flow secondary to the stenosis. 2. Moderate stenosis in the proximal right posterior communicating artery of the fetal type right PCA. This results in asymmetric attenuation of distal right PCA branch vessels compared to the left. 3. Mild atherosclerotic changes at the left carotid bifurcation without significant stenosis. 4. Diffuse thyroid goiter. This is followed by ultrasound. No follow-up recommended (ref: J Am Coll Radiol. 2015 Feb;12(2): 143-50). 5. Mild tortuosity of the cervical  internal carotid arteries bilaterally without significant stenosis. 6. Atherosclerotic changes at the origin of the dominant left vertebral artery without significant stenosis. 7.  Aortic Atherosclerosis  (ICD10-I70.0). 8. Mild distal small vessel disease without other significant proximal stenosis, aneurysm, or branch vessel occlusion within the Circle of Willis. Electronically Signed   By: San Morelle M.D.   On: 06/08/2019 21:27   MR ANGIO HEAD WO CONTRAST  Result Date: 06/08/2019 CLINICAL DATA:  Acute onset of right-sided numbness and paresthesia. EXAM: MRI HEAD WITHOUT CONTRAST MRA HEAD WITHOUT CONTRAST TECHNIQUE: Multiplanar, multiecho pulse sequences of the brain and surrounding structures were obtained without intravenous contrast. Angiographic images of the head were obtained using MRA technique without contrast. COMPARISON:  Head CT same day FINDINGS: MRI HEAD FINDINGS Brain: Diffusion imaging shows acute infarction in the left para median pons. No other acute infarction. Elsewhere, cerebellum appears normal. Cerebral hemispheres show very minimal small vessel change of the white matter. No cortical or large vessel territory infarction. No intra-axial mass lesion, hemorrhage, hydrocephalus or extra-axial collection. There is a right frontal vertex meningioma measuring 12 mm in diameter without mass effect upon the brain. Vascular: Major vessels at the base of the brain show flow. Skull and upper cervical spine: Negative Sinuses/Orbits: Clear/normal Other: None MRA HEAD FINDINGS Both internal carotid arteries are patent through the skull base and siphon regions. The anterior and middle cerebral vessels are patent without proximal stenosis, aneurysm or vascular malformation. Dominant left vertebral artery is widely patent to the basilar. No antegrade flow is seen in the right vertebral artery at the foramen magnum level. There is retrograde flow back to right PICA. Mild atherosclerotic irregularity of the basilar artery. Superior cerebellar arteries show flow but demonstrate atherosclerotic irregularity. Right PCA shows flow, receiving supply from a narrow irregular posterior communicating artery  and from a severely disease right P1 segment from the basilar tip. 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 Otherwise, the brain only shows minimal small vessel change of the cerebral hemispheric white matter. 12 mm meningioma at the right frontal vertex without significant mass-effect upon the brain. No antegrade flow in the right vertebral artery at the foramen magnum. Left vertebral artery widely patent to the basilar. Retrograde flow back to right PICA. Compromise supply to the right PCA, which receives flow from a narrow and irregular posterior communicating artery and a severely diseased P1 segment from the basilar tip. Electronically Signed   By: Nelson Chimes M.D.   On: 06/08/2019 16:52   MR BRAIN WO CONTRAST  Result Date: 06/08/2019 CLINICAL DATA:  Acute onset of right-sided numbness and paresthesia. EXAM: MRI HEAD WITHOUT CONTRAST MRA HEAD WITHOUT CONTRAST TECHNIQUE: Multiplanar, multiecho pulse sequences of the brain and surrounding structures were obtained without intravenous contrast. Angiographic images of the head were obtained using MRA technique without contrast. COMPARISON:  Head CT same day FINDINGS: MRI HEAD FINDINGS Brain: Diffusion imaging shows acute infarction in the left para median pons. No other acute infarction. Elsewhere, cerebellum appears normal. Cerebral hemispheres show very minimal small vessel change of the white matter. No cortical or large vessel territory infarction. No intra-axial mass lesion, hemorrhage, hydrocephalus or extra-axial collection. There is a right frontal vertex meningioma measuring 12 mm in diameter without mass effect upon the brain. Vascular: Major vessels at the base of the brain show flow. Skull and upper cervical spine: Negative Sinuses/Orbits: Clear/normal Other: None MRA HEAD FINDINGS Both internal carotid arteries are patent through the skull base and siphon regions. The  anterior and middle cerebral vessels are patent without  proximal stenosis, aneurysm or vascular malformation. Dominant left vertebral artery is widely patent to the basilar. No antegrade flow is seen in the right vertebral artery at the foramen magnum level. There is retrograde flow back to right PICA. Mild atherosclerotic irregularity of the basilar artery. Superior cerebellar arteries show flow but demonstrate atherosclerotic irregularity. Right PCA shows flow, receiving supply from a narrow irregular posterior communicating artery and from a severely disease right P1 segment from the basilar tip. 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 Otherwise, the brain only shows minimal small vessel change of the cerebral hemispheric white matter. 12 mm meningioma at the right frontal vertex without significant mass-effect upon the brain. No antegrade flow in the right vertebral artery at the foramen magnum. Left vertebral artery widely patent to the basilar. Retrograde flow back to right PICA. Compromise supply to the right PCA, which receives flow from a narrow and irregular posterior communicating artery and a severely diseased P1 segment from the basilar tip. Electronically Signed   By: Nelson Chimes M.D.   On: 06/08/2019 16:52   ECHOCARDIOGRAM COMPLETE  Result Date: 06/09/2019    ECHOCARDIOGRAM REPORT   Patient Name:   JALEAH PRIOR Date of Exam: 06/09/2019 Medical Rec #:  [id number]          Height:       63.0 in Accession #:    [id number]         Weight:       259.3 lb Date of Birth:  Jul 26, 1960          BSA:          2.159 m Patient Age:    59 years           BP:           134/86 mmHg Patient Gender: F                  HR:           82 bpm. Exam Location:  Inpatient Procedure: 2D Echo Indications:    Stroke 434.91 / I163.9  History:        Patient has prior history of Echocardiogram examinations, most                 recent 06/04/2017. Signs/Symptoms:Shortness of Breath and                 Syncope; Risk Factors:Diabetes. Edema.  Sonographer:     Vikki Ports Turrentine Referring Phys: [id number] [place]  1. Left ventricular ejection fraction, by estimation, is 60 to 65%. The left ventricle has normal function. The left ventricle has no regional wall motion abnormalities. There is mild left ventricular hypertrophy. Left ventricular diastolic parameters are consistent with Grade I diastolic dysfunction (impaired relaxation).  2. Right ventricular systolic function is normal. The right ventricular size is normal.  3. The mitral valve is grossly normal. Trivial mitral valve regurgitation.  4. The aortic valve was not well visualized. Aortic valve regurgitation is not visualized.  5. The inferior vena cava is normal in size with greater than 50% respiratory variability, suggesting right atrial pressure of 3 mmHg. FINDINGS  Left Ventricle: Left ventricular ejection fraction, by estimation, is 60 to 65%. The left ventricle has normal function. The left ventricle has no regional wall motion abnormalities. The left ventricular internal cavity size was normal in size. There is  mild left ventricular hypertrophy. Left  ventricular diastolic parameters are consistent with Grade I diastolic dysfunction (impaired relaxation). Indeterminate filling pressures. Right Ventricle: The right ventricular size is normal. No increase in right ventricular wall thickness. Right ventricular systolic function is normal. Left Atrium: Left atrial size was normal in size. Right Atrium: Right atrial size was normal in size. Pericardium: There is no evidence of pericardial effusion. Mitral Valve: The mitral valve is grossly normal. Trivial mitral valve regurgitation. Tricuspid Valve: The tricuspid valve is grossly normal. Tricuspid valve regurgitation is not demonstrated. Aortic Valve: The aortic valve was not well visualized. Aortic valve regurgitation is not visualized. Pulmonic Valve: The pulmonic valve was normal in structure. Pulmonic valve regurgitation is not visualized.  Aorta: The aortic root and ascending aorta are structurally normal, with no evidence of dilitation. Venous: The inferior vena cava is normal in size with greater than 50% respiratory variability, suggesting right atrial pressure of 3 mmHg. IAS/Shunts: The interatrial septum was not well visualized.  LEFT VENTRICLE PLAX 2D LVIDd:         4.00 cm  Diastology LVIDs:         2.82 cm  LV e' lateral:   15.30 cm/s LV PW:         1.12 cm  LV E/e' lateral: 6.2 LV IVS:        1.13 cm  LV e' medial:    5.98 cm/s LVOT diam:     2.00 cm  LV E/e' medial:  15.9 LV SV:         75 LV SV Index:   35 LVOT Area:     3.14 cm  RIGHT VENTRICLE RV S prime:     18.80 cm/s TAPSE (M-mode): 2.7 cm LEFT ATRIUM             Index       RIGHT ATRIUM           Index LA diam:        3.70 cm 1.71 cm/m  RA Area:     15.80 cm LA Vol (A2C):   51.1 ml 23.67 ml/m RA Volume:   39.50 ml  18.29 ml/m LA Vol (A4C):   37.4 ml 17.32 ml/m LA Biplane Vol: 44.8 ml 20.75 ml/m  AORTIC VALVE LVOT Vmax:   109.00 cm/s LVOT Vmean:  81.800 cm/s LVOT VTI:    0.240 m  AORTA Ao Root diam: 2.80 cm MITRAL VALVE MV Area (PHT): 3.42 cm     SHUNTS MV Decel Time: 222 msec     Systemic VTI:  0.24 m MV E velocity: 95.10 cm/s   Systemic Diam: 2.00 cm MV A velocity: 124.00 cm/s MV E/A ratio:  0.77 Lyman Bishop MD Electronically signed by Lyman Bishop MD Signature Date/Time: 06/09/2019/11:54:58 AM    Final    CT HEAD CODE STROKE WO CONTRAST  Result Date: 06/08/2019 CLINICAL DATA:  Code stroke. Neuro deficit, acute, stroke suspected. Additional history provided: Last known normal 1330, face numbness, arm numbness. EXAM: CT HEAD WITHOUT CONTRAST TECHNIQUE: Contiguous axial images were obtained from the base of the skull through the vertex without intravenous contrast. COMPARISON:  Head CT 03/21/2011 FINDINGS: Brain: There is no evidence of acute intracranial hemorrhage, intracranial mass, midline shift or extra-axial fluid collection.No demarcated cortical infarction.  Cerebral volume is normal for age. Partially empty sella turcica. Vascular: No hyperdense vessel.  Atherosclerotic calcifications. Skull: Normal. Negative for fracture or focal lesion. Sinuses/Orbits: Visualized orbits demonstrate no acute abnormality. No significant paranasal sinus disease or mastoid effusion at  the imaged levels. IMPRESSION: No evidence of acute intracranial abnormality. Electronically Signed   By: Kellie Simmering DO   On: 06/08/2019 14:54     Labs:   Basic Metabolic Panel: Recent Labs  Lab 06/08/19 1437 06/08/19 1454  NA 135 135  K 4.1 3.9  CL 100 101  CO2 24  --   GLUCOSE 478* 469*  BUN 16 20  CREATININE 1.36* 1.20*  CALCIUM 9.2  --    GFR Estimated Creatinine Clearance: 63.3 mL/min (A) (by C-G formula based on SCr of 1.2 mg/dL (H)). Liver Function Tests: Recent Labs  Lab 06/08/19 1437  AST 17  ALT 17  ALKPHOS 165*  BILITOT 0.7  PROT 6.6  ALBUMIN 3.2*   No results for input(s): LIPASE, AMYLASE in the last 168 hours. No results for input(s): AMMONIA in the last 168 hours. Coagulation profile Recent Labs  Lab 06/08/19 1437  INR 1.0    CBC: Recent Labs  Lab 06/08/19 1437 06/08/19 1454  WBC 13.8*  --   NEUTROABS 10.1*  --   HGB 12.5 14.3  HCT 42.0 42.0  MCV 75.8*  --   PLT 427*  --    Cardiac Enzymes: No results for input(s): CKTOTAL, CKMB, CKMBINDEX, TROPONINI in the last 168 hours. BNP: Invalid input(s): POCBNP CBG: Recent Labs  Lab 06/08/19 1800 06/08/19 2203 06/09/19 0809 06/09/19 1129  GLUCAP 290* 192* 92 208*   D-Dimer No results for input(s): DDIMER in the last 72 hours. Hgb A1c Recent Labs    06/09/19 0339  HGBA1C 11.4*   Lipid Profile Recent Labs    06/09/19 0339  CHOL 214*  HDL 31*  LDLCALC 131*  TRIG 260*  CHOLHDL 6.9   Thyroid function studies No results for input(s): TSH, T4TOTAL, T3FREE, THYROIDAB in the last 72 hours.  Invalid input(s): FREET3 Anemia work up No results for input(s): VITAMINB12,  FOLATE, FERRITIN, TIBC, IRON, RETICCTPCT in the last 72 hours. Microbiology Recent Results (from the past 240 hour(s))  SARS CORONAVIRUS 2 (TAT 6-24 HRS) Nasopharyngeal Nasopharyngeal Swab     Status: None   Collection Time: 06/08/19  8:17 PM   Specimen: Nasopharyngeal Swab  Result Value Ref Range Status   SARS Coronavirus 2 NEGATIVE NEGATIVE Final    Comment: (NOTE) SARS-CoV-2 target nucleic acids are NOT DETECTED. The SARS-CoV-2 RNA is generally detectable in upper and lower respiratory specimens during the acute phase of infection. Negative results do not preclude SARS-CoV-2 infection, do not rule out co-infections with other pathogens, and should not be used as the sole basis for treatment or other patient management decisions. Negative results must be combined with clinical observations, patient history, and epidemiological information. The expected result is Negative. Fact Sheet for Patients: SugarRoll.be Fact Sheet for Healthcare Providers: https://www.woods-mathews.com/ This test is not yet approved or cleared by the [place] FDA and  has been authorized for detection and/or diagnosis of SARS-CoV-2 by FDA under an Emergency Use Authorization (EUA). This EUA will remain  in effect (meaning this test can be used) for the duration of the COVID-19 declaration under Section 56 4(b)(1) of the Act, 21 U.S.C. section 360bbb-3(b)(1), unless the authorization is terminated or revoked sooner. Performed at [facility], [place] [address], [place], [place] [address]      Discharge Instructions:   Discharge Instructions    Diet - low sodium heart healthy   Complete by: As directed    Discharge instructions   Complete by: As directed    Take  medications as prescribed Follow up with stroke team as scheduled Follow up with PCP 1-2 weeks for evaluation of diabetes control   Increase activity slowly   Complete by: As directed       Allergies as of 06/09/2019      Reactions   Tramadol Nausea Only      Medication List    TAKE these medications   aspirin 81 MG EC tablet Take 1 tablet (81 mg total) by mouth daily. Start taking on: June 10, 2019   atorvastatin 40 MG tablet Commonly known as: LIPITOR Take 1 tablet (40 mg total) by mouth at bedtime. What changed:   medication strength  how much to take   Basaglar KwikPen 100 UNIT/ML Inject 1.4 mLs (140 Units total) into the skin every morning. And pen needles 1/day   carvedilol 6.25 MG tablet Commonly known as: Coreg Take 1 tablet (6.25 mg total) by mouth 2 (two) times daily.   clopidogrel 75 MG tablet Commonly known as: PLAVIX Take 1 tablet (75 mg total) by mouth daily for 21 days.   Dexilant 60 MG capsule Generic drug: dexlansoprazole Take 1 capsule (60 mg total) by mouth daily.   diclofenac 50 MG EC tablet Commonly known as: VOLTAREN Take 1 tablet by mouth twice daily   DULoxetine 60 MG capsule Commonly known as: CYMBALTA Take 1 capsule (60 mg total) by mouth 2 (two) times daily.   Farxiga 5 MG Tabs tablet Generic drug: dapagliflozin propanediol Take 5 mg by mouth every morning.   ferrous gluconate 324 MG tablet Commonly known as: FERGON Take 1 tablet (324 mg total) by mouth 2 (two) times daily with a meal.   furosemide 80 MG tablet Commonly known as: LASIX Take 1 tablet (80 mg total) by mouth daily.   HYDROcodone-acetaminophen 10-325 MG tablet Commonly known as: NORCO Take 1 tablet by mouth every 6 (six) hours as needed. Please do not  Fill before 05/30/2019 What changed: reasons to take this   Jardiance 10 MG Tabs tablet Generic drug: empagliflozin Take 10 mg by mouth daily before breakfast.   LORazepam 0.5 MG tablet Commonly known as: ATIVAN Take 1 tablet (0.5 mg total) by mouth 2 (two) times daily as needed. for anxiety   nortriptyline 25 MG capsule Commonly known as: PAMELOR Take 1 capsule by mouth at bedtime    potassium chloride 10 MEQ tablet Commonly known as: KLOR-CON Take 1 tablet (10 mEq total) by mouth daily.      Follow-up Information    [facility] Follow up in 4 week(s).   Specialty: Neurology Why: stroke clinic. office will call with appt date and time. Contact information: [address] [place] [phone]           Time coordinating discharge:   Signed:  Radene Gunning NP  [facility] 06/09/2019, 3:23 PM

## 2019-06-09 NOTE — Evaluation (Signed)
Physical Therapy Evaluation Patient Details Name: [name] MRN: [id number] DOB: Dec 30, 1960 Today's Date: 06/09/2019   History of Present Illness  Pt is a 59 y/o female admitted secondary to R extremity weakness and numbness. 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 PMH includes DM, anxiety, depression.   Clinical Impression  Pt admitted secondary to problem above with deficits below. Pt requiring occasional min to min guard A for steadying. Pt reporting numbness in RLE and R hand. Feel pt would benefit from outpatient PT to address balance deficits. Reports she has a roommate that can assist if needed. Will continue to follow acutely to maximize functional mobility independence and safety.     Follow Up Recommendations Outpatient PT(neuro outpatient PT )    Equipment Recommendations  None recommended by PT    Recommendations for Other Services       Precautions / Restrictions Precautions Precautions: Fall Restrictions Weight Bearing Restrictions: No      Mobility  Bed Mobility Overal bed mobility: Modified Independent                Transfers Overall transfer level: Needs assistance Equipment used: None Transfers: Sit to/from Stand Sit to Stand: Min guard         General transfer comment: Min guard for safety and steadying.   Ambulation/Gait Ambulation/Gait assistance: Min guard;Min assist Gait Distance (Feet): 150 Feet Assistive device: None Gait Pattern/deviations: Step-through pattern;Decreased stride length;Staggering right     General Gait Details: Min guard A for steadying. LOB X 2 that required min A. Pt reports numbness in RLE throughout.   Stairs Stairs: Yes Stairs assistance: Min guard Stair Management: One rail Right;Alternating pattern;Forwards Number of Stairs: 2 General stair comments: Educated about using step to pattern to increase safety. Required min guard for steadying.   Wheelchair Mobility    Modified Rankin  (Stroke Patients Only)       Balance Overall balance assessment: Needs assistance Sitting-balance support: No upper extremity supported;Feet supported Sitting balance-Leahy Scale: Good     Standing balance support: No upper extremity supported Standing balance-Leahy Scale: Fair                               Pertinent Vitals/Pain Pain Assessment: No/denies pain    Home Living Family/patient expects to be discharged to:: Private residence Living Arrangements: Non-relatives/Friends(roommate) Available Help at Discharge: Friend(s);Available PRN/intermittently Type of Home: House Home Access: Stairs to enter Entrance Stairs-Rails: None Entrance Stairs-Number of Steps: 3 Home Layout: One level Home Equipment: None      Prior Function Level of Independence: Independent         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        Extremity/Trunk Assessment   Upper Extremity Assessment Upper Extremity Assessment: Defer to OT evaluation(R hand numbness)    Lower Extremity Assessment Lower Extremity Assessment: RLE deficits/detail RLE Deficits / Details: RLE numbness into foot    Cervical / Trunk Assessment Cervical / Trunk Assessment: Normal  Communication   Communication: No difficulties  Cognition Arousal/Alertness: Awake/alert Behavior During Therapy: WFL for tasks assessed/performed Overall Cognitive Status: Within Functional Limits for tasks assessed                                        General Comments  Exercises     Assessment/Plan    PT Assessment Patient needs continued PT services  PT Problem List Impaired sensation;Decreased balance;Decreased mobility       PT Treatment Interventions Gait training;Stair training;Functional mobility training;Therapeutic activities;Therapeutic exercise;Balance training;Patient/family education    PT Goals (Current goals can be found in the Care Plan section)   Acute Rehab PT Goals Patient Stated Goal: to go home PT Goal Formulation: With patient Time For Goal Achievement: 06/23/19 Potential to Achieve Goals: Good    Frequency Min 4X/week   Barriers to discharge        Co-evaluation               AM-PAC PT "6 Clicks" Mobility  Outcome Measure Help needed turning from your back to your side while in a flat bed without using bedrails?: None Help needed moving from lying on your back to sitting on the side of a flat bed without using bedrails?: None Help needed moving to and from a bed to a chair (including a wheelchair)?: A Little Help needed standing up from a chair using your arms (e.g., wheelchair or bedside chair)?: A Little Help needed to walk in hospital room?: A Little Help needed climbing 3-5 steps with a railing? : A Little 6 Click Score: 20    End of Session Equipment Utilized During Treatment: Gait belt Activity Tolerance: Patient tolerated treatment well Patient left: in bed;with call bell/phone within reach Nurse Communication: Mobility status PT Visit Diagnosis: Unsteadiness on feet (R26.81)    Time: [id number] PT Time Calculation (min) (ACUTE ONLY): 12 min   Charges:   PT Evaluation $PT Eval Low Complexity: 1 Low          Lou Miner, DPT  Acute Rehabilitation Services  Pager: [phone] Office: [phone]   Rudean Hitt 06/09/2019, 2:14 PM

## 2019-06-09 NOTE — Progress Notes (Signed)
  Echocardiogram 2D Echocardiogram has been performed.  [name] [name] 06/09/2019, 9:50 AM

## 2019-06-09 NOTE — Progress Notes (Signed)
Pt admitted to 3W32 from ED. Telemetry verified, skin intact. A&Ox4.

## 2019-06-09 NOTE — Progress Notes (Addendum)
Inpatient Diabetes Program Recommendations  AACE/ADA: New Consensus Statement on Inpatient Glycemic Control   Target Ranges:  Prepandial:   less than 140 mg/dL      Peak postprandial:   less than 180 mg/dL (1-2 hours)      Critically ill patients:  140 - 180 mg/dL   Results for ROSILYN, SEIDLE (MRN [id number]) as of 06/09/2019 13:35  Ref. Range 06/08/2019 18:00 06/08/2019 22:03 06/09/2019 08:09 06/09/2019 11:29  Glucose-Capillary Latest Ref Range: 70 - 99 mg/dL 290 (H) 192 (H) 92 208 (H)   Review of Glycemic Control  Diabetes history: DM2 Outpatient Diabetes medications: Jardiance 10 mg QAM (just started about 4 weeks ago), Farxiga 5 mg QAM, Basaglar 100-140 units QHS (takes 100 units if fasting glucose less than 200 mg/dl and takes 140 units if fasting glucose over 200 mg/dl) Current orders for Inpatient glycemic control: Lantus 140 units daily, Novolog 0-15 units TID with meals, Novolog 0-5 units QHS  Inpatient Diabetes Program Recommendations:   HgbA1C: A1C 11.4% on 06/09/19 indicating an average glucose of 280 mg/dl over the past 2-3 months.  NOTE: In reviewing chart, noted patient sees Dr. Loanne Drilling and last office visit was on 05/28/18 at which time patient was instructed to increase Basaglar to 140 units QAM and reduce Ozepmic to 0.5 mg Qweek.  Fasting glucose 92 mg/dl today and patient received Lantus 140 units at 11:35 am today.  Will plan to talk with patient to clarify outpatient DM medications.  Addendum 06/09/19@13 :55-Spoke with patient regarding outpatient DM medication regimen. Patient states that she is taking Jardiance 10 mg QAM (just started about 4 weeks ago), Farxiga 5 mg QAM, Basaglar 100-140 units QHS (takes 100 units if fasting glucose less than 200 mg/dl and takes 140 units if fasting glucose over 200 mg/dl) for DM management. Patient states that she does not skip insulin dosages at all.  Patient states that she did not take Basaglar at home yesterday prior to coming to the  hospital.  Patient states that she was seeing Dr. Loanne Drilling (Endocrinologist) but she has not seen him in over a year and D. Tysinger, PA has been helping manage her DM most recently and he was the provider that started her on Jardiance.  Inquired about Ozempic noted in Dr. Cordelia Pen last office note and patient states that she is no longer take Ozempic.  Discussed A1C of 11.4% on 06/09/19 and patient indicates that current A1C is improved from A1C of 13.6% on 04/13/19. Patient reports that she is checking glucose BID and since she started the Jardiance, fasting glucose has ranged from 95-115 mg/dl and glucose in the afternoon is been in the low 200's mg/dl.  Informed patient that she received Lantus 140 units today and fasting glucose was 92 mg/dl. Encouraged patient to call nursing staff if she felt her glucose was dropping so glucose could be checked and treated if needed.  Encouraged patient to continue to follow up with provider regarding improve DM control.  Patient verbalized understanding of information discussed and she states that she has no questions at this time related to DM.  Thanks, Barnie Alderman, RN, MSN, CDE Diabetes Coordinator Inpatient Diabetes Program [phone] (Team Pager from 8am to 5pm)

## 2019-06-09 NOTE — TOC Transition Note (Signed)
Transition of Care [facility]) - CM/SW Discharge Note   Patient Details  Name: Cynithia Breisch MRN: [id number] Date of Birth: 03/04/1961  [facility] of [facility]) CM/SW Contact:  Pollie Friar, RN Phone Number: 06/09/2019, 3:55 PM   Clinical Narrative:    Pt discharging home with outpatient therapy. Pt has selected to attend on [facility]. Orders in [place] and information on the AVS. Pt states she has support at home and transportation to home.    Final next level of care: OP Rehab Barriers to Discharge: No Barriers Identified   Patient Goals and CMS Choice        Discharge Placement                       Discharge Plan and Services                                     Social Determinants of Health (SDOH) Interventions     Readmission Risk Interventions No flowsheet data found.

## 2019-06-09 NOTE — ED Notes (Signed)
Pt upset about the monitor beeping, pt oriented that when the monitor detect something abnormal or out of rage it will beep and that is the way we monitor her, pt refuses to have the monitor on and remove all the leads and blood pressure cuff, pt reeducated about the importance of having accurate Vitals signs on her specific that she just had a CVA, pt then agree to have the monitor on.

## 2019-06-09 NOTE — Progress Notes (Signed)
STROKE TEAM PROGRESS NOTE   INTERVAL HISTORY I have personally reviewed history of presenting illness with the patient, electronic medical records and imaging films in PACS.  She presented with transient left-sided numbness a few days ago which recovered completely and then yesterday she had recurrent symptoms which have persisted.  MRI scan shows paramedian pontine lacunar infarct.  She does have a loop recorder but that was placed by cardiologist for cardiac arrhythmias.  So far atrial fibrillation has not yet been found.  LDL cholesterol is 131 mg percent.  Hemoglobin A1c is 11.4.  Echocardiogram is pending.  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  Left vertebral artery intracranially is patent to the basilar without narrowing.  Vitals:   06/09/19 0515 06/09/19 0538 06/09/19 0710 06/09/19 0810  BP:  123/68 121/84 134/86  Pulse: 74  77 82  Resp: 18  17 16   Temp:    98.1 F (36.7 C)  TempSrc:    Oral  SpO2: 95% 95% 95% 96%  Weight:      Height:        CBC:  Recent Labs  Lab 06/08/19 1437 06/08/19 1454  WBC 13.8*  --   NEUTROABS 10.1*  --   HGB 12.5 14.3  HCT 42.0 42.0  MCV 75.8*  --   PLT 427*  --     Basic Metabolic Panel:  Recent Labs  Lab 06/08/19 1437 06/08/19 1454  NA 135 135  K 4.1 3.9  CL 100 101  CO2 24  --   GLUCOSE 478* 469*  BUN 16 20  CREATININE 1.36* 1.20*  CALCIUM 9.2  --    Lipid Panel:     Component Value Date/Time   CHOL 214 (H) 06/09/2019 0339   CHOL 193 10/02/2017 1124   TRIG 260 (H) 06/09/2019 0339   HDL 31 (L) 06/09/2019 0339   HDL 31 (L) 10/02/2017 1124   CHOLHDL 6.9 06/09/2019 0339   VLDL 52 (H) 06/09/2019 0339   LDLCALC 131 (H) 06/09/2019 0339   LDLCALC 99 10/02/2017 1124   HgbA1c:  Lab Results  Component Value Date   HGBA1C 11.4 (H) 06/09/2019   Urine Drug Screen:     Component Value Date/Time   LABOPIA  POSITIVE (A) 06/08/2019 1437   COCAINSCRNUR NONE DETECTED 06/08/2019 1437   LABBENZ POSITIVE (A) 06/08/2019 1437   AMPHETMU NONE DETECTED 06/08/2019 1437   THCU NONE DETECTED 06/08/2019 1437   LABBARB NONE DETECTED 06/08/2019 1437    Alcohol Level     Component Value Date/Time   ETH <10 06/08/2019 1550    IMAGING past 24 hours CT ANGIO HEAD W OR WO CONTRAST  Result Date: 06/08/2019 CLINICAL DATA:  Left paramedian brainstem infarct. Episode of numbness and tingling sensation to the right upper and lower extremity as well as the lower lip lasting for 1 hour. EXAM: CT ANGIOGRAPHY HEAD AND NECK TECHNIQUE: Multidetector CT imaging of the head and neck was performed using the standard protocol during bolus administration of intravenous contrast. Multiplanar CT image reconstructions and MIPs were obtained to evaluate the vascular anatomy. Carotid stenosis measurements (when applicable) are obtained utilizing NASCET criteria, using the distal internal carotid diameter as the denominator. CONTRAST:  39mL OMNIPAQUE IOHEXOL 350 MG/ML SOLN COMPARISON:  MR head without contrast and MRA head 06/08/2019. CT head without contrast 06/08/2019 FINDINGS: CTA NECK FINDINGS Aortic arch: A 3 vessel arch configuration is present. Atherosclerotic calcifications  are present without significant stenosis or aneurysm. Right carotid system: The right common carotid artery is within normal limits. Bifurcation is unremarkable. Mild tortuosity is present in the cervical right ICA without significant stenosis. Left carotid system: The left common carotid artery is within normal limits. The bifurcation demonstrates some atherosclerotic change without a significant stenosis relative to the more distal vessel. The cervical left ICA is otherwise normal. Vertebral arteries: Atherosclerotic calcifications are present at the origin of the dominant left vertebral artery. There is no significant stenosis at the origin of the hypoplastic right  vertebral artery. The left vertebral artery is within normal limits in the neck. Focal irregularity and stenosis is present at the distal right P2 segment at the C1-2 level. Skeleton: Vertebral body heights and alignment are normal. There straightening of the normal cervical lordosis. Other neck: Diffuse thyroid goiter is again noted without significant interval change. This is followed by ultrasound. The patient has undergone multiple biopsies. Most recent evaluation was 10/28/2018. No follow-up recommended (ref: J Am Coll Radiol. 2015 Feb;12(2): 143-50).There is some mass effect on the thyroid scratched at there is some mass effect on the trachea without significant stenosis. Soft tissues the neck are otherwise unremarkable. Salivary glands are. No significant adenopathy is present. Upper chest: The lung apices are clear. The thoracic inlet is within normal limits. Review of the MIP images confirms the above findings CTA HEAD FINDINGS Anterior circulation: The internal carotid arteries are within normal limits from the high cervical segments through the ICA termini. The A1 and M1 segments are normal. The MCA bifurcations are intact. Mild irregularity is present in the distal ACA and MCA branch vessels without a significant proximal stenosis or occlusion. Posterior circulation: The left vertebral artery is the dominant vessel. The PICA origins are visualized and normal bilaterally. The vertebral artery is normal. The left posterior cerebral artery originates from the basilar tip. The right posterior cerebral artery is of fetal type. There is a stenosis in the proximal right posterior communicating artery. Asymmetric attenuation of distal PCA branch vessels is noted on the right compared to the left. Left PCA branch vessels are within normal limits. Venous sinuses: The dural sinuses are patent. The straight sinus deep cerebral veins are intact. Cortical veins are within normal limits. No vascular malformations are  evident. Anatomic variants: Fetal type right posterior cerebral artery. Review of the MIP images confirms the above findings IMPRESSION: 1. Focal stenosis of the distal right V2 segment at the C1-2 level. This corresponds with lack of antegrade flow seen on the MRA. This likely represents very slow flow secondary to the stenosis. 2. Moderate stenosis in the proximal right posterior communicating artery of the fetal type right PCA. This results in asymmetric attenuation of distal right PCA branch vessels compared to the left. 3. Mild atherosclerotic changes at the left carotid bifurcation without significant stenosis. 4. Diffuse thyroid goiter. This is followed by ultrasound. No follow-up recommended (ref: J Am Coll Radiol. 2015 Feb;12(2): 143-50). 5. Mild tortuosity of the cervical internal carotid arteries bilaterally without significant stenosis. 6. Atherosclerotic changes at the origin of the dominant left vertebral artery without significant stenosis. 7.  Aortic Atherosclerosis (ICD10-I70.0). 8. Mild distal small vessel disease without other significant proximal stenosis, aneurysm, or branch vessel occlusion within the Circle of Willis. Electronically Signed   By: San Morelle M.D.   On: 06/08/2019 21:27   CT ANGIO NECK W OR WO CONTRAST  Result Date: 06/08/2019 CLINICAL DATA:  Left paramedian brainstem infarct. Episode of  numbness and tingling sensation to the right upper and lower extremity as well as the lower lip lasting for 1 hour. EXAM: CT ANGIOGRAPHY HEAD AND NECK TECHNIQUE: Multidetector CT imaging of the head and neck was performed using the standard protocol during bolus administration of intravenous contrast. Multiplanar CT image reconstructions and MIPs were obtained to evaluate the vascular anatomy. Carotid stenosis measurements (when applicable) are obtained utilizing NASCET criteria, using the distal internal carotid diameter as the denominator. CONTRAST:  27mL OMNIPAQUE IOHEXOL 350  MG/ML SOLN COMPARISON:  MR head without contrast and MRA head 06/08/2019. CT head without contrast 06/08/2019 FINDINGS: CTA NECK FINDINGS Aortic arch: A 3 vessel arch configuration is present. Atherosclerotic calcifications are present without significant stenosis or aneurysm. Right carotid system: The right common carotid artery is within normal limits. Bifurcation is unremarkable. Mild tortuosity is present in the cervical right ICA without significant stenosis. Left carotid system: The left common carotid artery is within normal limits. The bifurcation demonstrates some atherosclerotic change without a significant stenosis relative to the more distal vessel. The cervical left ICA is otherwise normal. Vertebral arteries: Atherosclerotic calcifications are present at the origin of the dominant left vertebral artery. There is no significant stenosis at the origin of the hypoplastic right vertebral artery. The left vertebral artery is within normal limits in the neck. Focal irregularity and stenosis is present at the distal right P2 segment at the C1-2 level. Skeleton: Vertebral body heights and alignment are normal. There straightening of the normal cervical lordosis. Other neck: Diffuse thyroid goiter is again noted without significant interval change. This is followed by ultrasound. The patient has undergone multiple biopsies. Most recent evaluation was 10/28/2018. No follow-up recommended (ref: J Am Coll Radiol. 2015 Feb;12(2): 143-50).There is some mass effect on the thyroid scratched at there is some mass effect on the trachea without significant stenosis. Soft tissues the neck are otherwise unremarkable. Salivary glands are. No significant adenopathy is present. Upper chest: The lung apices are clear. The thoracic inlet is within normal limits. Review of the MIP images confirms the above findings CTA HEAD FINDINGS Anterior circulation: The internal carotid arteries are within normal limits from the high  cervical segments through the ICA termini. The A1 and M1 segments are normal. The MCA bifurcations are intact. Mild irregularity is present in the distal ACA and MCA branch vessels without a significant proximal stenosis or occlusion. Posterior circulation: The left vertebral artery is the dominant vessel. The PICA origins are visualized and normal bilaterally. The vertebral artery is normal. The left posterior cerebral artery originates from the basilar tip. The right posterior cerebral artery is of fetal type. There is a stenosis in the proximal right posterior communicating artery. Asymmetric attenuation of distal PCA branch vessels is noted on the right compared to the left. Left PCA branch vessels are within normal limits. Venous sinuses: The dural sinuses are patent. The straight sinus deep cerebral veins are intact. Cortical veins are within normal limits. No vascular malformations are evident. Anatomic variants: Fetal type right posterior cerebral artery. Review of the MIP images confirms the above findings IMPRESSION: 1. Focal stenosis of the distal right V2 segment at the C1-2 level. This corresponds with lack of antegrade flow seen on the MRA. This likely represents very slow flow secondary to the stenosis. 2. Moderate stenosis in the proximal right posterior communicating artery of the fetal type right PCA. This results in asymmetric attenuation of distal right PCA branch vessels compared to the left. 3. Mild atherosclerotic changes  at the left carotid bifurcation without significant stenosis. 4. Diffuse thyroid goiter. This is followed by ultrasound. No follow-up recommended (ref: J Am Coll Radiol. 2015 Feb;12(2): 143-50). 5. Mild tortuosity of the cervical internal carotid arteries bilaterally without significant stenosis. 6. Atherosclerotic changes at the origin of the dominant left vertebral artery without significant stenosis. 7.  Aortic Atherosclerosis (ICD10-I70.0). 8. Mild distal small vessel  disease without other significant proximal stenosis, aneurysm, or branch vessel occlusion within the Circle of Willis. Electronically Signed   By: San Morelle M.D.   On: 06/08/2019 21:27   MR ANGIO HEAD WO CONTRAST  Result Date: 06/08/2019 CLINICAL DATA:  Acute onset of right-sided numbness and paresthesia. EXAM: MRI HEAD WITHOUT CONTRAST MRA HEAD WITHOUT CONTRAST TECHNIQUE: Multiplanar, multiecho pulse sequences of the brain and surrounding structures were obtained without intravenous contrast. Angiographic images of the head were obtained using MRA technique without contrast. COMPARISON:  Head CT same day FINDINGS: MRI HEAD FINDINGS Brain: Diffusion imaging shows acute infarction in the left para median pons. No other acute infarction. Elsewhere, cerebellum appears normal. Cerebral hemispheres show very minimal small vessel change of the white matter. No cortical or large vessel territory infarction. No intra-axial mass lesion, hemorrhage, hydrocephalus or extra-axial collection. There is a right frontal vertex meningioma measuring 12 mm in diameter without mass effect upon the brain. Vascular: Major vessels at the base of the brain show flow. Skull and upper cervical spine: Negative Sinuses/Orbits: Clear/normal Other: None MRA HEAD FINDINGS Both internal carotid arteries are patent through the skull base and siphon regions. The anterior and middle cerebral vessels are patent without proximal stenosis, aneurysm or vascular malformation. Dominant left vertebral artery is widely patent to the basilar. No antegrade flow is seen in the right vertebral artery at the foramen magnum level. There is retrograde flow back to right PICA. Mild atherosclerotic irregularity of the basilar artery. Superior cerebellar arteries show flow but demonstrate atherosclerotic irregularity. Right PCA shows flow, receiving supply from a narrow irregular posterior communicating artery and from a severely disease right P1  segment from the basilar tip. 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 Otherwise, the brain only shows minimal small vessel change of the cerebral hemispheric white matter. 12 mm meningioma at the right frontal vertex without significant mass-effect upon the brain. No antegrade flow in the right vertebral artery at the foramen magnum. Left vertebral artery widely patent to the basilar. Retrograde flow back to right PICA. Compromise supply to the right PCA, which receives flow from a narrow and irregular posterior communicating artery and a severely diseased P1 segment from the basilar tip. Electronically Signed   By: Nelson Chimes M.D.   On: 06/08/2019 16:52   MR BRAIN WO CONTRAST  Result Date: 06/08/2019 CLINICAL DATA:  Acute onset of right-sided numbness and paresthesia. EXAM: MRI HEAD WITHOUT CONTRAST MRA HEAD WITHOUT CONTRAST TECHNIQUE: Multiplanar, multiecho pulse sequences of the brain and surrounding structures were obtained without intravenous contrast. Angiographic images of the head were obtained using MRA technique without contrast. COMPARISON:  Head CT same day FINDINGS: MRI HEAD FINDINGS Brain: Diffusion imaging shows acute infarction in the left para median pons. No other acute infarction. Elsewhere, cerebellum appears normal. Cerebral hemispheres show very minimal small vessel change of the white matter. No cortical or large vessel territory infarction. No intra-axial mass lesion, hemorrhage, hydrocephalus or extra-axial collection. There is a right frontal vertex meningioma measuring 12 mm in diameter without mass effect upon the brain. Vascular: Major vessels at  the base of the brain show flow. Skull and upper cervical spine: Negative Sinuses/Orbits: Clear/normal Other: None MRA HEAD FINDINGS Both internal carotid arteries are patent through the skull base and siphon regions. The anterior and middle cerebral vessels are patent without proximal stenosis, aneurysm or vascular  malformation. Dominant left vertebral artery is widely patent to the basilar. No antegrade flow is seen in the right vertebral artery at the foramen magnum level. There is retrograde flow back to right PICA. Mild atherosclerotic irregularity of the basilar artery. Superior cerebellar arteries show flow but demonstrate atherosclerotic irregularity. Right PCA shows flow, receiving supply from a narrow irregular posterior communicating artery and from a severely disease right P1 segment from the basilar tip. 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 Otherwise, the brain only shows minimal small vessel change of the cerebral hemispheric white matter. 12 mm meningioma at the right frontal vertex without significant mass-effect upon the brain. No antegrade flow in the right vertebral artery at the foramen magnum. Left vertebral artery widely patent to the basilar. Retrograde flow back to right PICA. Compromise supply to the right PCA, which receives flow from a narrow and irregular posterior communicating artery and a severely diseased P1 segment from the basilar tip. Electronically Signed   By: Nelson Chimes M.D.   On: 06/08/2019 16:52   CT HEAD CODE STROKE WO CONTRAST  Result Date: 06/08/2019 CLINICAL DATA:  Code stroke. Neuro deficit, acute, stroke suspected. Additional history provided: Last known normal 1330, face numbness, arm numbness. EXAM: CT HEAD WITHOUT CONTRAST TECHNIQUE: Contiguous axial images were obtained from the base of the skull through the vertex without intravenous contrast. COMPARISON:  Head CT 03/21/2011 FINDINGS: Brain: There is no evidence of acute intracranial hemorrhage, intracranial mass, midline shift or extra-axial fluid collection.No demarcated cortical infarction. Cerebral volume is normal for age. Partially empty sella turcica. Vascular: No hyperdense vessel.  Atherosclerotic calcifications. Skull: Normal. Negative for fracture or focal lesion. Sinuses/Orbits:  Visualized orbits demonstrate no acute abnormality. No significant paranasal sinus disease or mastoid effusion at the imaged levels. IMPRESSION: No evidence of acute intracranial abnormality. Electronically Signed   By: Kellie Simmering DO   On: 06/08/2019 14:54    PHYSICAL EXAM Obese middle-aged African-American lady not in distress. . Afebrile. Head is nontraumatic. Neck is supple without bruit.    Cardiac exam no murmur or gallop. Lungs are clear to auscultation. Distal pulses are well felt. Neurological Exam ;  Awake  Alert oriented x 3. Normal speech and language.eye movements full without nystagmus.fundi were not visualized. Vision acuity and fields appear normal. Hearing is normal. Palatal movements are normal. Face symmetric. Tongue midline. Normal strength, tone, reflexes and coordination.  Diminished right face and hemibody sensation. Gait deferred. NIH stroke scale 1 ASSESSMENT/PLAN Ms. Etty Model is a 59 y.o. female with history of HTN, DB, OSA, SVT, depression ands chronic pain presenting with R sided sensory numbness and paresthesias along with vertigo and an unsteady sensation x 3 days.   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   MRI  Acute L paramedian pontine infarct. 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   MRA  No antegrade flow R VA. Retrograde flow to R PICA. Poor R PCA flow d/t d/t narrow and irregular PCom and severe dz P1 from basilar tip.  CTA head & neck distal R V2 stenosis. Proximal R PCom moderate stenosis. L ICA bifurcation  atherosclerosis. Diffuse goiter. B cervical ICA tortuosity. Dome L VA origin atherosclerosis. Mild distal small vessel disease.  2D Echo EF 60-65%. No source of embolus   Has loop recorder placed 3 yrs ago by Dr Olevia Perches - no AF    LDL 131  HgbA1c 11.4  Heparin 5000 units sq tid for VTE prophylaxis  No antithrombotic prior to admission, now on  aspirin 81 mg daily. Add plavix. Continue DAPT x 3 weeks then aspirin alone. Added to d/c orders and AVS  Therapy recommendations:  OP PT  Disposition:  Return home [facility] at [facility] in 4 weeks. Office will call with appointment date and time. Order placed.  Hypertension  Stable . BP goal normotensive  Hyperlipidemia  Home meds:  lipitor 20  Now on lipitor 40  LDL 131, goal < 70  Continue statin at discharge  Diabetes type II Uncontrolled  HgbA1c 11.4, goal < 7.0  Other Stroke Risk Factors  Morbid Obesity, Body mass index is 45.93 kg/m., recommend weight loss, diet and exercise as appropriate   Obstructive sleep apnea, on CPAP at home  Other Active Problems  Meningioma, incidental  Leukocytosis  Metabolic syndrome  CKD stage III  Hospital day # 0  She presented with recurrent right body paresthesias secondary to lacunar left pontine infarct from small vessel disease.  Recommend aspirin Plavix for 3 weeks followed by aspirin alone and aggressive risk factor modification.  Patient counseled to be compliant with using CPAP at night for sleep apnea.  Patient was offered transfer to participate in the [employer] stroke prevention trial and initially showed interest but after reviewing consent form she decided not to participate.  Greater than 50% time during this 35-minute visit was spent in counseling and coordination of care and discussion with care team.  Discussed with Dr. Eulogio Bear, DO Antony Contras, MD To contact Stroke Continuity provider, please refer to [email or web address] After hours, contact General Neurology

## 2019-06-10 SURGERY — Surgical Case
Anesthesia: *Unknown

## 2019-06-17 ENCOUNTER — Telehealth: Payer: Self-pay | Admitting: Physical Medicine & Rehabilitation

## 2019-06-17 ENCOUNTER — Telehealth: Payer: Self-pay

## 2019-06-17 ENCOUNTER — Encounter: Payer: BC Managed Care – PPO | Admitting: Medical

## 2019-06-17 NOTE — Telephone Encounter (Signed)
Return [name] call,  She states she called the Outpatient therapy on [address], her referral went to the wrong outpatient therapy location. She has an appointment with Neuro-Rehabilitation next week she reports.

## 2019-06-17 NOTE — Telephone Encounter (Signed)
Patient is returning Eunice's phone call.

## 2019-06-17 NOTE — Telephone Encounter (Signed)
Return [name] call , she reports she had a CVA, and was asking questions about a walker. She was admitted to [facility] on 06/08/2019 and discharged home on 06/09/2019, she was diagnosed with CVA in left para medial pontine. She wa instructed to call her outpatient therapy, she verbalizes understanding. Emotional support given.  She was instructed to call office after speaking with therapy, she verbalizes understanding.

## 2019-06-17 NOTE — Telephone Encounter (Signed)
Patient called stating she needs a call from [place].

## 2019-06-22 ENCOUNTER — Ambulatory Visit (INDEPENDENT_AMBULATORY_CARE_PROVIDER_SITE_OTHER): Payer: BC Managed Care – PPO | Admitting: *Deleted

## 2019-06-22 DIAGNOSIS — I471 Supraventricular tachycardia: Secondary | ICD-10-CM

## 2019-06-22 LAB — CUP PACEART REMOTE DEVICE CHECK
Date Time Interrogation Session: 20210411024845
Implantable Pulse Generator Implant Date: 20171120

## 2019-06-23 NOTE — Progress Notes (Signed)
ILR Remote 

## 2019-06-24 ENCOUNTER — Other Ambulatory Visit: Payer: Self-pay

## 2019-06-24 ENCOUNTER — Encounter: Payer: Self-pay | Admitting: Medical

## 2019-06-24 ENCOUNTER — Ambulatory Visit: Payer: BC Managed Care – PPO | Admitting: Medical

## 2019-06-24 ENCOUNTER — Telehealth: Payer: Self-pay | Admitting: Endocrinology

## 2019-06-24 VITALS — BP 136/76 | HR 89 | Temp 98.2°F | Ht 63.0 in | Wt 255.8 lb

## 2019-06-24 DIAGNOSIS — D329 Benign neoplasm of meninges, unspecified: Secondary | ICD-10-CM

## 2019-06-24 DIAGNOSIS — I6389 Other cerebral infarction: Secondary | ICD-10-CM

## 2019-06-24 DIAGNOSIS — I159 Secondary hypertension, unspecified: Secondary | ICD-10-CM | POA: Diagnosis not present

## 2019-06-24 DIAGNOSIS — R232 Flushing: Secondary | ICD-10-CM

## 2019-06-24 DIAGNOSIS — R200 Anesthesia of skin: Secondary | ICD-10-CM

## 2019-06-24 DIAGNOSIS — R531 Weakness: Secondary | ICD-10-CM | POA: Insufficient documentation

## 2019-06-24 MED ORDER — FIASP FLEXTOUCH 100 UNIT/ML ~~LOC~~ SOPN: 10.0000 [IU] | PEN_INJECTOR | Freq: Two times a day (BID) | SUBCUTANEOUS | 0 refills | Status: DC

## 2019-06-24 NOTE — Patient Instructions (Signed)
Diabetes  Check glucose prior to meals and write the number down  BEGIN Fiasp fast acting meal time insulin twice daily with lunch and dinner.     Use 10 units of Fiasp with lunch and dinner  CONTINUE Basalgar long acting basal insulin at bedtime, but lets change the dose to 115 units daily  The goal is to see <130 in the morning  The goal is to avoid sugars over 200  Send me a my chart sugar log and update on how you feel in 1 week  If any low readings such as below 80, then use the info below about hypoglycemia.   We want to AVOID hypoglycemia  Continue Farxiga 56m daily tablet  Continue all other medications as outlined by the hospital   Plan to follow up with neurology in May  Get in with Dr. ELoanne DrillingASAP for diabetes  Continue with plan for physical therapy      Hypoglycemia Hypoglycemia is when the sugar (glucose) level in your blood is too low. Signs of low blood sugar may include:  Feeling: ? Hungry. ? Worried or nervous (anxious). ? Sweaty and clammy. ? Confused. ? Dizzy. ? Sleepy. ? Sick to your stomach (nauseous).  Having: ? A fast heartbeat. ? A headache. ? A change in your vision. ? Tingling or no feeling (numbness) around your mouth, lips, or tongue. ? Jerky movements that you cannot control (seizure).  Having trouble with: ? Moving (coordination). ? Sleeping. ? Passing out (fainting). ? Getting upset easily (irritability). Low blood sugar can happen to people who have diabetes and people who do not have diabetes. Low blood sugar can happen quickly, and it can be an emergency. Treating low blood sugar Low blood sugar is often treated by eating or drinking something sugary right away, such as:  Fruit juice, 4-6 oz (120-150 mL).  Regular soda (not diet soda), 4-6 oz (120-150 mL).  Low-fat milk, 4 oz (120 mL).  Several pieces of hard candy.  Sugar or honey, 1 Tbsp (15 mL). Treating low blood sugar if you have diabetes If you can think  clearly and swallow safely, follow the 15:15 rule:  Take 15 grams of a fast-acting carb (carbohydrate). Talk with your doctor about how much you should take.  Always keep a source of fast-acting carb with you, such as: ? Sugar tablets (glucose pills). Take 3-4 pills. ? 6-8 pieces of hard candy. ? 4-6 oz (120-150 mL) of fruit juice. ? 4-6 oz (120-150 mL) of regular (not diet) soda. ? 1 Tbsp (15 mL) honey or sugar.  Check your blood sugar 15 minutes after you take the carb.  If your blood sugar is still at or below 70 mg/dL (3.9 mmol/L), take 15 grams of a carb again.  If your blood sugar does not go above 70 mg/dL (3.9 mmol/L) after 3 tries, get help right away.  After your blood sugar goes back to normal, eat a meal or a snack within 1 hour.  Treating very low blood sugar If your blood sugar is at or below 54 mg/dL (3 mmol/L), you have very low blood sugar (severe hypoglycemia). This may also cause:  Passing out.  Jerky movements you cannot control (seizure).  Losing consciousness (coma). This is an emergency. Do not wait to see if the symptoms will go away. Get medical help right away. Call your local emergency services (911 in the [place] Do not drive yourself to the hospital. If you have very low blood sugar and  you cannot eat or drink, you may need a glucagon shot (injection). A family member or friend should learn how to check your blood sugar and how to give you a glucagon shot. Ask your doctor if you need to have a glucagon shot kit at home. Follow these instructions at home: General instructions  Take over-the-counter and prescription medicines only as told by your doctor.  Stay aware of your blood sugar as told by your doctor.  Limit alcohol intake to no more than 1 drink a day for nonpregnant women and 2 drinks a day for men. One drink equals 12 oz of beer (355 mL), 5 oz of wine (148 mL), or 1 oz of hard liquor (44 mL).  Keep all follow-up visits as told by your  doctor. This is important. If you have diabetes:   Follow your diabetes care plan as told by your doctor. Make sure you: ? Know the signs of low blood sugar. ? Take your medicines as told. ? Follow your exercise and meal plan. ? Eat on time. Do not skip meals. ? Check your blood sugar as often as told by your doctor. Always check it before and after exercise. ? Follow your sick day plan when you cannot eat or drink normally. Make this plan ahead of time with your doctor.  Share your diabetes care plan with: ? Your work or school. ? People you live with.  Check your pee (urine) for ketones: ? When you are sick. ? As told by your doctor.  Carry a card or wear jewelry that says you have diabetes. Contact a doctor if:  You have trouble keeping your blood sugar in your target range.  You have low blood sugar often. Get help right away if:  You still have symptoms after you eat or drink something sugary.  Your blood sugar is at or below 54 mg/dL (3 mmol/L).  You have jerky movements that you cannot control.  You pass out. These symptoms may be an emergency. Do not wait to see if the symptoms will go away. Get medical help right away. Call your local emergency services (911 in the [place] Do not drive yourself to the hospital. Summary  Hypoglycemia happens when the level of sugar (glucose) in your blood is too low.  Low blood sugar can happen to people who have diabetes and people who do not have diabetes. Low blood sugar can happen quickly, and it can be an emergency.  Make sure you know the signs of low blood sugar and know how to treat it.  Always keep a source of sugar (fast-acting carb) with you to treat low blood sugar. This information is not intended to replace advice given to you by your health care provider. Make sure you discuss any questions you have with your health care provider. Document Revised: 06/19/2018 Document Reviewed: 04/01/2015 Elsevier Patient Education   2020 [employer].

## 2019-06-24 NOTE — Telephone Encounter (Signed)
please contact patient: F/u is due 

## 2019-06-24 NOTE — Progress Notes (Signed)
Subjective: Chief Complaint  Patient presents with  . Follow-up    has stroke    Here for hospital followup.   Was recently hospitalized for stroke.   Still has ongoing right side numbness and mild weakness.  No new symptoms.     Overall doing ok.   She starts physical therapy tomorrow.   Has f/u with neurology 07/20/2019.    Hospital discharge included increasing statin dose, and basal insulin was increased 20 units to 140 units daily.   She continues on [place] added last visit here at our office.    She has not yet gotten follow up with endocrinology in 1 year due to $80 copay is hard to come up with.   She is aware to take Aspirin + Plavix x 3 weeks, then aspirin alone.   Checking sugars - 120 , sometimes 150s post prandial.     She wants note for reduced work hours until neurology followup.  Tried to work full schedule last 2 days and couldn't do it since the stroke.   Works in the school as a Tree surgeon  Past Medical History:  Diagnosis Date  . Abscess    increased drainage from abscess on buttock  . Anal fistula   . Anxiety   . Bilateral hip pain 05/27/2015  . Chronic pain syndrome 05/27/2015  . Depression    sees [name]  . Diabetes mellitus without complication ([place])   . Hypertension   . Sleep apnea    2008- sleep study, neg. for sleep apnea   . Stroke ([place])   . SVT (supraventricular tachycardia) ([place])   . Symptomatic cholelithiasis 09/11/2018   Current Outpatient Medications on File Prior to Visit  Medication Sig Dispense Refill  . aspirin 81 MG EC tablet Take 1 tablet (81 mg total) by mouth daily.    Marland Kitchen atorvastatin (LIPITOR) 40 MG tablet Take 1 tablet (40 mg total) by mouth at bedtime. 30 tablet 0  . carvedilol (COREG) 6.25 MG tablet Take 1 tablet (6.25 mg total) by mouth 2 (two) times daily. 180 tablet 0  . clopidogrel (PLAVIX) 75 MG tablet Take 1 tablet (75 mg total) by mouth daily for 21 days. 21 tablet 0  . dexlansoprazole (DEXILANT) 60 MG capsule  Take 1 capsule (60 mg total) by mouth daily. 90 capsule 0  . diclofenac (VOLTAREN) 50 MG EC tablet Take 1 tablet by mouth twice daily 180 tablet 2  . DULoxetine (CYMBALTA) 60 MG capsule Take 1 capsule (60 mg total) by mouth 2 (two) times daily. 180 capsule 0  . empagliflozin (JARDIANCE) 10 MG TABS tablet Take 10 mg by mouth daily before breakfast. 90 tablet 1  . FARXIGA 5 MG TABS tablet Take 5 mg by mouth every morning. 90 tablet 0  . ferrous gluconate (FERGON) 324 MG tablet Take 1 tablet (324 mg total) by mouth 2 (two) times daily with a meal. 90 tablet 0  . furosemide (LASIX) 80 MG tablet Take 1 tablet (80 mg total) by mouth daily. 90 tablet 0  . HYDROcodone-acetaminophen (NORCO) 10-325 MG tablet Take 1 tablet by mouth every 6 (six) hours as needed. Please do not  Fill before 05/30/2019 (Patient taking differently: Take 1 tablet by mouth every 6 (six) hours as needed for moderate pain. Please do not  Fill before 05/30/2019) 120 tablet 0  . Insulin Glargine (BASAGLAR KWIKPEN) 100 UNIT/ML SOPN Inject 1.4 mLs (140 Units total) into the skin every morning. And pen needles 1/day 50 pen  0  . LORazepam (ATIVAN) 0.5 MG tablet Take 1 tablet (0.5 mg total) by mouth 2 (two) times daily as needed. for anxiety 60 tablet 0  . nortriptyline (PAMELOR) 25 MG capsule Take 1 capsule by mouth at bedtime 30 capsule 2  . potassium chloride (KLOR-CON) 10 MEQ tablet Take 1 tablet (10 mEq total) by mouth daily. 90 tablet 1   No current facility-administered medications on file prior to visit.   ROS as in subjective   Objective: BP 136/76   Pulse 89   Temp 98.2 F (36.8 C)   Ht 5\' 3"  (1.6 m)   Wt 255 lb 12.8 oz (116 kg)   LMP 02/09/2017   SpO2 98%   BMI 45.31 kg/m   Last Weight  Most recent update: 06/24/2019  1:40 PM   Weight  116 kg (255 lb 12.8 oz)           Wt Readings from Last 3 Encounters:  06/24/19 255 lb 12.8 oz (116 kg)  06/08/19 259 lb 4.2 oz (117.6 kg)  05/04/19 262 lb (118.8 kg)   Gen:  wd, wn ,nad Psych: pleasant, answers questions appropriately, normal affect Neuro: mild weakness of right arm and leg in general, but overall 4-5/5 strength, DTRs wnl, otherwise CN2-12 intact, normal strength, sensation, neg Romberg, unstable with heel to toe Heart rrr, normal s1, s2, no murmurs Lungs clear Ext: no edema Pulses WNL    Assessment: Encounter Diagnoses  Name Primary?  . Cerebrovascular accident (CVA) due to other mechanism ([place]) Yes  . Hot flashes   . Secondary hypertension   . Meningioma ([place])   . Right sided weakness   . Right sided numbness     Plan: Reviewed discharge summary, reconciled medications.  She has f/u with neurology scheduled, PT starts tomorrow.  Wrote reduced work hours for her until 07/20/2019 neurology appt  Discussed diabetes uncontrolled.   Modifications made today.  Gave samples of Fiasp.  Recommendations:  Diabetes  Check glucose prior to meals and write the number down  BEGIN Fiasp fast acting meal time insulin twice daily with lunch and dinner.     Use 10 units of Fiasp with lunch and dinner  CONTINUE Basalgar long acting basal insulin at bedtime, but lets change the dose to 115 units daily  The goal is to see <130 in the morning  The goal is to avoid sugars over 200  Send me a my chart sugar log and update on how you feel in 1 week  If any low readings such as below 80, then use the info below about hypoglycemia.   We want to AVOID hypoglycemia  Continue Farxiga 5mg  daily tablet  Continue all other medications as outlined by the hospital   Plan to follow up with neurology in May  Get in with Dr. Loanne Drilling ASAP for diabetes  Continue with plan for physical therapy    [name] was seen today for follow-up.  Diagnoses and all orders for this visit:  Cerebrovascular accident (CVA) due to other mechanism [facility])  Hot flashes  Secondary hypertension  Meningioma ([place])  Right sided weakness  Right sided  numbness  Other orders -     insulin aspart (FIASP FLEXTOUCH) 100 UNIT/ML FlexTouch Pen; Inject 10 Units into the skin in the morning and at bedtime.  f/u 2 weeks, sooner prn

## 2019-06-25 ENCOUNTER — Ambulatory Visit: Payer: BC Managed Care – PPO | Attending: Internal Medicine

## 2019-06-25 DIAGNOSIS — I69351 Hemiplegia and hemiparesis following cerebral infarction affecting right dominant side: Secondary | ICD-10-CM | POA: Diagnosis not present

## 2019-06-25 NOTE — Telephone Encounter (Signed)
LVM requesting returned call 
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
## 2019-06-29 ENCOUNTER — Ambulatory Visit: Payer: BC Managed Care – PPO

## 2019-06-29 ENCOUNTER — Other Ambulatory Visit: Payer: Self-pay

## 2019-06-29 DIAGNOSIS — I69351 Hemiplegia and hemiparesis following cerebral infarction affecting right dominant side: Secondary | ICD-10-CM
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
## 2019-06-30 ENCOUNTER — Other Ambulatory Visit: Payer: Self-pay

## 2019-06-30 ENCOUNTER — Encounter: Payer: Self-pay | Admitting: Registered Nurse

## 2019-06-30 ENCOUNTER — Encounter: Payer: BC Managed Care – PPO | Attending: Physical Medicine & Rehabilitation | Admitting: Registered Nurse

## 2019-06-30 VITALS — BP 155/77 | HR 89 | Temp 96.5°F | Ht 63.0 in | Wt 260.6 lb

## 2019-06-30 DIAGNOSIS — M25551 Pain in right hip: Secondary | ICD-10-CM | POA: Diagnosis not present

## 2019-06-30 DIAGNOSIS — F418 Other specified anxiety disorders: Secondary | ICD-10-CM | POA: Insufficient documentation

## 2019-06-30 DIAGNOSIS — M79641 Pain in right hand: Secondary | ICD-10-CM | POA: Diagnosis present

## 2019-06-30 DIAGNOSIS — Z833 Family history of diabetes mellitus: Secondary | ICD-10-CM | POA: Diagnosis not present

## 2019-06-30 DIAGNOSIS — I10 Essential (primary) hypertension: Secondary | ICD-10-CM | POA: Diagnosis not present

## 2019-06-30 DIAGNOSIS — Z809 Family history of malignant neoplasm, unspecified: Secondary | ICD-10-CM | POA: Diagnosis not present

## 2019-06-30 DIAGNOSIS — Z79891 Long term (current) use of opiate analgesic: Secondary | ICD-10-CM | POA: Diagnosis present

## 2019-06-30 DIAGNOSIS — M25552 Pain in left hip: Secondary | ICD-10-CM | POA: Insufficient documentation

## 2019-06-30 DIAGNOSIS — Z5181 Encounter for therapeutic drug level monitoring: Secondary | ICD-10-CM

## 2019-06-30 DIAGNOSIS — G4730 Sleep apnea, unspecified: Secondary | ICD-10-CM | POA: Diagnosis not present

## 2019-06-30 DIAGNOSIS — E119 Type 2 diabetes mellitus without complications: Secondary | ICD-10-CM | POA: Insufficient documentation

## 2019-06-30 DIAGNOSIS — G894 Chronic pain syndrome: Secondary | ICD-10-CM | POA: Diagnosis not present

## 2019-06-30 DIAGNOSIS — M5416 Radiculopathy, lumbar region: Secondary | ICD-10-CM | POA: Diagnosis not present

## 2019-06-30 DIAGNOSIS — I471 Supraventricular tachycardia: Secondary | ICD-10-CM | POA: Diagnosis not present

## 2019-06-30 DIAGNOSIS — M7062 Trochanteric bursitis, left hip: Secondary | ICD-10-CM

## 2019-06-30 DIAGNOSIS — Z8249 Family history of ischemic heart disease and other diseases of the circulatory system: Secondary | ICD-10-CM | POA: Diagnosis not present

## 2019-06-30 DIAGNOSIS — G8929 Other chronic pain: Secondary | ICD-10-CM | POA: Diagnosis present

## 2019-06-30 MED ORDER — HYDROCODONE-ACETAMINOPHEN 10-325 MG PO TABS: 1.0000 | ORAL_TABLET | Freq: Four times a day (QID) | ORAL | 0 refills | Status: DC | PRN

## 2019-06-30 NOTE — Progress Notes (Signed)
Subjective:    Patient ID: [name], female    DOB: 1960-09-07, 59 y.o.   MRN: [id number]  HPI: [name] is a 59 y.o. female who returns for follow up appointment for chronic pain and medication refill. She states her pain is located in her lower back radiating into her left hip. She rates her pain 7. Her current exercise regime is walking and attending physical therapy two days a week.   [name] was admitted to [facility] on 06/08/2019 and discharged on 06/09/2019 with CVA of the Left Para Median pons.  MRI:  IMPRESSION: Acute infarction of the left para median pons. Otherwise, the brain only shows minimal small vessel change of the cerebral hemispheric white matter.  12 mm meningioma at the right frontal vertex without significant mass-effect upon the brain.  No antegrade flow in the right vertebral artery at the foramen magnum. Left vertebral artery widely patent to the basilar. Retrograde flow back to right PICA. Compromise supply to the right PCA, which receives flow from a narrow and irregular posterior communicating artery and a severely diseased P1 segment from the basilar tip.   [name] equivalent is 40.00  MME.  Last Oral Swab was Performed on 05/04/2019, it was consistent.   Pain Inventory Average Pain 7 Pain Right Now 7 My pain is sharp, dull, stabbing and aching  In the last 24 hours, has pain interfered with the following? General activity 10 Relation with others 8 Enjoyment of life 10 What TIME of day is your pain at its worst? evening Sleep (in general) Poor  Pain is worse with: walking and standing Pain improves with: medication Relief from Meds: 8  Mobility use a walker  Function employed # of hrs/week 20 disabled: date disabled 06/25/19 I need assistance with the following:  meal prep, household duties and shopping  Neuro/Psych No problems in this area  Prior Studies Any changes since last  visit?  no  Physicians involved in your care Any changes since last visit?  no   Family History  Problem Relation Age of Onset  . Hypertension Mother   . Alzheimer's disease Mother   . Diabetes Father   . Breast cancer Sister 72  . Anesthesia problems Neg Hx   . Hypotension Neg Hx   . Malignant hyperthermia Neg Hx   . Pseudochol deficiency Neg Hx   . Colon cancer Neg Hx   . Esophageal cancer Neg Hx   . Stomach cancer Neg Hx   . Rectal cancer Neg Hx   . Thyroid disease Neg Hx    Social History   Socioeconomic History  . Marital status: Divorced    Spouse name: Not on file  . Number of children: 0  . Years of education: Not on file  . Highest education level: Not on file  Occupational History  . Occupation: TEACHER ASSISTANT  Tobacco Use  . Smoking status: Never Smoker  . Smokeless tobacco: Never Used  Substance and Sexual Activity  . Alcohol use: No  . Drug use: No  . Sexual activity: Yes  Other Topics Concern  . Not on file  Social History Narrative  . Not on file   Social Determinants of Health   Financial Resource Strain:   . Difficulty of Paying Living Expenses:   Food Insecurity:   . Worried About Charity fundraiser in the Last Year:   . Arboriculturist in the Last Year:   Transportation Needs:   .  Lack of Transportation (Medical):   Marland Kitchen Lack of Transportation (Non-Medical):   Physical Activity:   . Days of Exercise per Week:   . Minutes of Exercise per Session:   Stress:   . Feeling of Stress :   Social Connections:   . Frequency of Communication with Friends and Family:   . Frequency of Social Gatherings with Friends and Family:   . Attends Religious Services:   . Active Member of Clubs or Organizations:   . Attends Archivist Meetings:   Marland Kitchen Marital Status:    Past Surgical History:  Procedure Laterality Date  . ANAL EXAMINATION UNDER ANESTHESIA  02/21/11   anal fistula  . BREAST SURGERY  patient does not remember date of procedure     pull fluid off lft br  . CHOLECYSTECTOMY N/A 09/12/2018   Procedure: LAPAROSCOPIC CHOLECYSTECTOMY;  Surgeon: Stark Klein, MD;  Location: [facility];  Service: General;  Laterality: N/A;  . ELECTROPHYSIOLOGIC STUDY N/A 05/05/2015   Procedure: SVT Ablation;  Surgeon: Will Meredith Leeds, MD;  Location: [facility];  Service: Cardiovascular;  Laterality: N/A;  . EP IMPLANTABLE DEVICE N/A 01/30/2016   Procedure: Loop Recorder Insertion;  Surgeon: Sanda Klein, MD;  Location: [facility];  Service: Cardiovascular;  Laterality: N/A;  . INCISE AND DRAIN ABCESS     abscess on right thigh and buttock  . KNEE ARTHROSCOPY     left  . LAPAROSCOPIC APPENDECTOMY N/A 09/19/2018   Procedure: APPENDECTOMY LAPAROSCOPIC;  Surgeon: Clovis Riley, MD;  Location: [facility];  Service: General;  Laterality: N/A;  . SHOULDER SURGERY  04/14/09   right   Past Medical History:  Diagnosis Date  . Abscess    increased drainage from abscess on buttock  . Anal fistula   . Anxiety   . Bilateral hip pain 05/27/2015  . Chronic pain syndrome 05/27/2015  . Depression    sees [name]  . Diabetes mellitus without complication ([place])   . Hypertension   . Sleep apnea    2008- sleep study, neg. for sleep apnea   . Stroke ([place])   . SVT (supraventricular tachycardia) ([place])   . Symptomatic cholelithiasis 09/11/2018   BP (!) 155/77   Pulse 89   Temp (!) 96.5 F (35.8 C) Comment: was sitting under A/C blowing on her  Ht 5\' 3"  (1.6 m)   Wt 260 lb 9.6 oz (118.2 kg)   LMP 02/09/2017   SpO2 95%   BMI 46.16 kg/m   Opioid Risk Score:   Fall Risk Score:  `1  Depression screen PHQ 2/9  Depression screen [facility] 2/9 06/30/2019 10/28/2017 10/02/2017 07/15/2017 05/23/2017 04/12/2017 03/21/2017  Decreased Interest 0 0 0 0 1 3 0  Down, Depressed, Hopeless 0 0 3 0 1 2 0  PHQ - 2 Score 0 0 3 0 2 5 0  Altered sleeping - - 1 - 0 3 -  Tired, decreased energy - - 0 - 1 3 -  Change in appetite - - 1 - 0 2 -  Feeling bad or  failure about yourself  - - 0 - 0 1 -  Trouble concentrating - - 0 - 0 1 -  Moving slowly or fidgety/restless - - 0 - 0 0 -  Suicidal thoughts - - 0 - 0 0 -  PHQ-9 Score - - 5 - 3 15 -  Difficult doing work/chores - - Somewhat difficult - - - -  Some recent data might be hidden  Review of Systems  Constitutional: Negative.   HENT: Negative.   Eyes: Negative.   Respiratory: Negative.   Cardiovascular: Negative.   Gastrointestinal: Negative.   Endocrine: Negative.   Genitourinary: Negative.   Musculoskeletal: Negative.   Skin: Negative.   Allergic/Immunologic: Negative.   Neurological: Negative.   Hematological: Negative.   Psychiatric/Behavioral: Negative.   All other systems reviewed and are negative.      Objective:   Physical Exam Vitals and nursing note reviewed.  Constitutional:      Appearance: Normal appearance. She is obese.  Cardiovascular:     Rate and Rhythm: Normal rate and regular rhythm.     Pulses: Normal pulses.     Heart sounds: Normal heart sounds.  Pulmonary:     Effort: Pulmonary effort is normal.     Breath sounds: Normal breath sounds.  Musculoskeletal:     Cervical back: Normal range of motion and neck supple.     Comments: Normal Muscle Bulk and Muscle Testing Reveals:  Upper Extremities: Full ROM and Muscle Strength on the Right 4/5  And Left  5/5   Lumbar Paraspinal Tenderness: L-3-L-5 Left Greater Trochanter Tenderness Lower Extremities: Full ROM and Muscle Strength 5/5 Arises from Table slowly using walker for support Narrow Based  Gait   Skin:    General: Skin is warm and dry.  Neurological:     Mental Status: She is alert and oriented to person, place, and time.  Psychiatric:        Mood and Affect: Mood normal.        Behavior: Behavior normal.           Assessment & Plan:  1.Chronic BilateralHip pain L>R---endstage OA of hips..Continue HEP as tolerated and Heat and Ice Therapy. Refilled:Hydrocodone 10/325 one q6  hours as needed for moderate pain #120. Second script e-scribed for the following month.06/30/2019 We will continue the opioid monitoring program, this consists of regular clinic visits, examinations, urine drug screen, pill counts as well as use of [place] Controlled Substance Reporting System. 2. Lumbar Radiculitis:Continue medication regimen withPamelor. Continue Cymbalta and Continue HEP as Tolerated. Continue to Monitor.06/30/2019.- 3. Morbid obesity: Continue with healthy diet Regime and HEP. Encouraged to continue with healthy diet regime andHEP.06/30/2019 4.LeftGreater Trochanteric Bursitis:.Continue with Heat and Ice Therapy. Continue to Monitor.06/30/2019. 5. Left Knee Pain:No complaints Today.Continue with HEP as Tolerated. Continue to Monitor.06/30/2019   53minutes of face to face patient care time was spent during this visit. All questions were encouraged and answered.  F/U in40months

## 2019-07-02 ENCOUNTER — Ambulatory Visit: Payer: BC Managed Care – PPO

## 2019-07-06 ENCOUNTER — Other Ambulatory Visit: Payer: Self-pay

## 2019-07-06 ENCOUNTER — Ambulatory Visit: Payer: BC Managed Care – PPO | Admitting: Medical

## 2019-07-06 ENCOUNTER — Encounter: Payer: Self-pay | Admitting: Medical

## 2019-07-06 VITALS — BP 130/64 | HR 80 | Temp 98.4°F | Ht 63.0 in | Wt 261.2 lb

## 2019-07-06 DIAGNOSIS — D329 Benign neoplasm of meninges, unspecified: Secondary | ICD-10-CM

## 2019-07-06 DIAGNOSIS — G894 Chronic pain syndrome: Secondary | ICD-10-CM

## 2019-07-06 DIAGNOSIS — G4700 Insomnia, unspecified: Secondary | ICD-10-CM

## 2019-07-06 DIAGNOSIS — E042 Nontoxic multinodular goiter: Secondary | ICD-10-CM

## 2019-07-06 DIAGNOSIS — I159 Secondary hypertension, unspecified: Secondary | ICD-10-CM | POA: Diagnosis not present

## 2019-07-06 DIAGNOSIS — E1165 Type 2 diabetes mellitus with hyperglycemia: Secondary | ICD-10-CM

## 2019-07-06 DIAGNOSIS — Z91410 Personal history of adult physical and sexual abuse: Secondary | ICD-10-CM

## 2019-07-06 DIAGNOSIS — D72829 Elevated white blood cell count, unspecified: Secondary | ICD-10-CM | POA: Diagnosis not present

## 2019-07-06 DIAGNOSIS — E785 Hyperlipidemia, unspecified: Secondary | ICD-10-CM

## 2019-07-06 DIAGNOSIS — Z0000 Encounter for general adult medical examination without abnormal findings: Secondary | ICD-10-CM

## 2019-07-06 DIAGNOSIS — E118 Type 2 diabetes mellitus with unspecified complications: Secondary | ICD-10-CM | POA: Diagnosis not present

## 2019-07-06 DIAGNOSIS — K219 Gastro-esophageal reflux disease without esophagitis: Secondary | ICD-10-CM | POA: Diagnosis not present

## 2019-07-06 DIAGNOSIS — F419 Anxiety disorder, unspecified: Secondary | ICD-10-CM

## 2019-07-06 DIAGNOSIS — L732 Hidradenitis suppurativa: Secondary | ICD-10-CM

## 2019-07-06 DIAGNOSIS — Z87898 Personal history of other specified conditions: Secondary | ICD-10-CM

## 2019-07-06 DIAGNOSIS — F4310 Post-traumatic stress disorder, unspecified: Secondary | ICD-10-CM

## 2019-07-06 DIAGNOSIS — L0292 Furuncle, unspecified: Secondary | ICD-10-CM

## 2019-07-06 DIAGNOSIS — F411 Generalized anxiety disorder: Secondary | ICD-10-CM

## 2019-07-06 DIAGNOSIS — E790 Hyperuricemia without signs of inflammatory arthritis and tophaceous disease: Secondary | ICD-10-CM

## 2019-07-06 DIAGNOSIS — IMO0002 Reserved for concepts with insufficient information to code with codable children: Secondary | ICD-10-CM

## 2019-07-06 DIAGNOSIS — F410 Panic disorder [episodic paroxysmal anxiety] without agoraphobia: Secondary | ICD-10-CM
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
## 2019-07-07 ENCOUNTER — Other Ambulatory Visit: Payer: Self-pay

## 2019-07-07 ENCOUNTER — Ambulatory Visit: Payer: BC Managed Care – PPO

## 2019-07-07 DIAGNOSIS — I69351 Hemiplegia and hemiparesis following cerebral infarction affecting right dominant side: Secondary | ICD-10-CM

## 2019-07-07 LAB — URIC ACID: Uric Acid: 6.7 mg/dL (ref 3.0–7.2)

## 2019-07-07 LAB — MICROALBUMIN / CREATININE URINE RATIO
Creatinine, Urine: 45.3 mg/dL
Microalb/Creat Ratio: 25 mg/g creat (ref 0–29)
Microalbumin, Urine: 11.2 ug/mL
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
## 2019-07-08 ENCOUNTER — Institutional Professional Consult (permissible substitution): Payer: BC Managed Care – PPO | Admitting: Medical

## 2019-07-09 ENCOUNTER — Other Ambulatory Visit: Payer: Self-pay

## 2019-07-09 ENCOUNTER — Telehealth: Payer: Self-pay | Admitting: Family Medicine

## 2019-07-09 ENCOUNTER — Ambulatory Visit (INDEPENDENT_AMBULATORY_CARE_PROVIDER_SITE_OTHER): Payer: BC Managed Care – PPO | Admitting: Endocrinology

## 2019-07-09 ENCOUNTER — Encounter: Payer: Self-pay | Admitting: Endocrinology

## 2019-07-09 ENCOUNTER — Telehealth: Payer: Self-pay | Admitting: Endocrinology

## 2019-07-09 VITALS — BP 144/80 | HR 95 | Ht 63.0 in | Wt 258.2 lb

## 2019-07-09 DIAGNOSIS — E1165 Type 2 diabetes mellitus with hyperglycemia: Secondary | ICD-10-CM

## 2019-07-09 DIAGNOSIS — E118 Type 2 diabetes mellitus with unspecified complications: Secondary | ICD-10-CM | POA: Diagnosis not present

## 2019-07-09 DIAGNOSIS — IMO0002 Reserved for concepts with insufficient information to code with codable children: Secondary | ICD-10-CM

## 2019-07-09 MED ORDER — FIASP FLEXTOUCH 100 UNIT/ML ~~LOC~~ SOPN: 20.0000 [IU] | PEN_INJECTOR | Freq: Three times a day (TID) | SUBCUTANEOUS | 3 refills | Status: DC

## 2019-07-09 MED ORDER — BASAGLAR KWIKPEN 100 UNIT/ML ~~LOC~~ SOPN: 100.0000 [IU] | PEN_INJECTOR | SUBCUTANEOUS | 3 refills | Status: DC

## 2019-07-09 NOTE — Telephone Encounter (Signed)
Patient called re: Patient went to Pharmacy after office visit but Pharmacy did not have patient's RX for insulin aspart (FIASP FLEXTOUCH) 100 UNIT/ML FlexTouch Pen. Patient requests that a RX for the medication listed above be sent to:  [facility], [address] Phone:  [phone]  Fax:  [phone]

## 2019-07-09 NOTE — Progress Notes (Signed)
Opened in error

## 2019-07-09 NOTE — Telephone Encounter (Signed)
Outpatient Medication Detail   Disp Refills Start End   insulin aspart (FIASP FLEXTOUCH) 100 UNIT/ML FlexTouch Pen 20 pen 3 07/09/2019    Sig - Route: Inject 20 Units into the skin 3 (three) times daily with meals. - Subcutaneous   Sent to pharmacy as: insulin aspart (FIASP FLEXTOUCH) 100 UNIT/ML FlexTouch Pen   E-Prescribing Status: Receipt confirmed by pharmacy (07/09/2019  3:12 PM EDT)

## 2019-07-09 NOTE — Patient Instructions (Addendum)
Your blood pressure is high today.  Please see your primary care provider soon, to have it rechecked check your blood sugar twice a day.  vary the time of day when you check, between before the 3 meals, and at bedtime.  also check if you have symptoms of your blood sugar being too high or too low.  please keep a record of the readings and bring it to your next appointment here (or you can bring the meter itself).  You can write it on any piece of paper.  please call us sooner if your blood sugar goes below 70, or if you have a lot of readings over 200.   please increase the Basaglar to 100 units each morning, and: increase the Fiasp to 20 units 3 times a day (just before each meal).   Please come back for a follow-up appointment in 1 month.

## 2019-07-09 NOTE — Telephone Encounter (Signed)
Called pt and advised FMLA forms ready she will pick up.

## 2019-07-09 NOTE — Progress Notes (Signed)
Subjective:    Patient ID: [name], female    DOB: Oct 08, 1960, 59 y.o.   MRN: [id number]  HPI Pt returns for f/u of diabetes mellitus: DM type: Insulin-requiring type 2 Dx'ed: 0000000 Complications: CRI and CVA Therapy: insulin since 2016, and Ozempic.  GDM: never (G0) DKA: never Severe hypoglycemia: never Pancreatitis: never Pancreatic imaging: normal on 2018 CT Other: she takes multiple daily injections; nausea limits Ozempic dosage.  Interval history: no cbg record, but states fasting cbg's vary from 120-300.  It is in general higher as the day goes on.  Pt says she never misses the insulin; she stopped Ozempic, due to nausea. She takes Fiasp, 10 units 3 times a day (just before each meal), and Basaglar, 120 units QD.   She also has multinodular goiter (dx'ed 2017; bx in 2018, of 2 nodules: Beth cat 1; f/u US in 2020 was unchanged, and showed none meet criteria for biopsy or follow-up; TSH is slightly suppressed).   Past Medical History:  Diagnosis Date  . Abscess    increased drainage from abscess on buttock  . Anal fistula   . Anxiety   . Bilateral hip pain 05/27/2015  . Chronic pain syndrome 05/27/2015  . Depression    sees [name]  . Diabetes mellitus without complication ([place])   . Hyperlipidemia   . Hypertension   . Sleep apnea    2008- sleep study, neg. for sleep apnea   . Stroke ([place])   . SVT (supraventricular tachycardia) ([place])   . Symptomatic cholelithiasis 09/11/2018    Past Surgical History:  Procedure Laterality Date  . ANAL EXAMINATION UNDER ANESTHESIA  02/21/11   anal fistula  . BREAST SURGERY  patient does not remember date of procedure   pull fluid off lft br  . CHOLECYSTECTOMY N/A 09/12/2018   Procedure: LAPAROSCOPIC CHOLECYSTECTOMY;  Surgeon: Stark Klein, MD;  Location: [facility];  Service: General;  Laterality: N/A;  . ELECTROPHYSIOLOGIC STUDY N/A 05/05/2015   Procedure: SVT Ablation;  Surgeon: Will Meredith Leeds, MD;  Location: [facility];  Service: Cardiovascular;  Laterality: N/A;  . EP IMPLANTABLE DEVICE N/A 01/30/2016   Procedure: Loop Recorder Insertion;  Surgeon: Sanda Klein, MD;  Location: [facility];  Service: Cardiovascular;  Laterality: N/A;  . INCISE AND DRAIN ABCESS     abscess on right thigh and buttock  . KNEE ARTHROSCOPY     left  . LAPAROSCOPIC APPENDECTOMY N/A 09/19/2018   Procedure: APPENDECTOMY LAPAROSCOPIC;  Surgeon: Clovis Riley, MD;  Location: [facility];  Service: General;  Laterality: N/A;  . SHOULDER SURGERY  04/14/09   right    Social History   Socioeconomic History  . Marital status: Divorced    Spouse name: Not on file  . Number of children: 0  . Years of education: Not on file  . Highest education level: Not on file  Occupational History  . Occupation: TEACHER ASSISTANT  Tobacco Use  . Smoking status: Never Smoker  . Smokeless tobacco: Never Used  Substance and Sexual Activity  . Alcohol use: No  . Drug use: No  . Sexual activity: Yes  Other Topics Concern  . Not on file  Social History Narrative  . Not on file   Social Determinants of Health   Financial Resource Strain:   . Difficulty of Paying Living Expenses:   Food Insecurity:   . Worried About Charity fundraiser in the Last Year:   . [employer]  in the Last Year:   Transportation Needs:   . Film/video editor (Medical):   Marland Kitchen Lack of Transportation (Non-Medical):   Physical Activity:   . Days of Exercise per Week:   . Minutes of Exercise per Session:   Stress:   . Feeling of Stress :   Social Connections:   . Frequency of Communication with Friends and Family:   . Frequency of Social Gatherings with Friends and Family:   . Attends Religious Services:   . Active Member of Clubs or Organizations:   . Attends Archivist Meetings:   Marland Kitchen Marital Status:   Intimate Partner Violence:   . Fear of Current or Ex-Partner:   . Emotionally Abused:   Marland Kitchen Physically Abused:   . Sexually Abused:      Current Outpatient Medications on File Prior to Visit  Medication Sig Dispense Refill  . aspirin 81 MG EC tablet Take 1 tablet (81 mg total) by mouth daily.    Marland Kitchen atorvastatin (LIPITOR) 40 MG tablet Take 1 tablet (40 mg total) by mouth at bedtime. 30 tablet 0  . carvedilol (COREG) 6.25 MG tablet Take 1 tablet (6.25 mg total) by mouth 2 (two) times daily. 180 tablet 0  . dexlansoprazole (DEXILANT) 60 MG capsule Take 1 capsule (60 mg total) by mouth daily. 90 capsule 0  . diclofenac (VOLTAREN) 50 MG EC tablet Take 1 tablet by mouth twice daily 180 tablet 2  . DULoxetine (CYMBALTA) 60 MG capsule Take 1 capsule (60 mg total) by mouth 2 (two) times daily. 180 capsule 0  . FARXIGA 5 MG TABS tablet Take 5 mg by mouth every morning. 90 tablet 0  . ferrous gluconate (FERGON) 324 MG tablet Take 1 tablet (324 mg total) by mouth 2 (two) times daily with a meal. 90 tablet 0  . furosemide (LASIX) 80 MG tablet Take 1 tablet (80 mg total) by mouth daily. 90 tablet 0  . HYDROcodone-acetaminophen (NORCO) 10-325 MG tablet Take 1 tablet by mouth every 6 (six) hours as needed. Please do not  Fill before 07/30/2019 120 tablet 0  . LORazepam (ATIVAN) 0.5 MG tablet Take 1 tablet (0.5 mg total) by mouth 2 (two) times daily as needed. for anxiety 60 tablet 0  . nortriptyline (PAMELOR) 25 MG capsule Take 1 capsule by mouth at bedtime 30 capsule 2  . potassium chloride (KLOR-CON) 10 MEQ tablet Take 1 tablet (10 mEq total) by mouth daily. 90 tablet 1   No current facility-administered medications on file prior to visit.    Allergies  Allergen Reactions  . Ozempic (0.25 Or 0.5 Mg-Dose) [Semaglutide(0.25 Or 0.5mg -Dos)]     Severe nausea, bad gas  . Tramadol Nausea Only    Family History  Problem Relation Age of Onset  . Hypertension Mother   . Alzheimer's disease Mother   . Diabetes Father   . Breast cancer Sister 41  . Anesthesia problems Neg Hx   . Hypotension Neg Hx   . Malignant hyperthermia Neg Hx   .  Pseudochol deficiency Neg Hx   . Colon cancer Neg Hx   . Esophageal cancer Neg Hx   . Stomach cancer Neg Hx   . Rectal cancer Neg Hx   . Thyroid disease Neg Hx     BP (!) 144/80   Pulse 95   Ht 5\' 3"  (1.6 m)   Wt 258 lb 3.2 oz (117.1 kg)   LMP 02/09/2017   SpO2 96%   BMI 45.74 kg/m  Review of Systems She denies hypoglycemia.      Objective:   Physical Exam VITAL SIGNS:  See vs page GENERAL: no distress Pulses: dorsalis pedis intact bilat.   MSK: no deformity of the feet CV: 1+ bilat leg edema.   Skin:  no ulcer on the feet.  normal color and temp on the feet. Neuro: sensation is intact to touch on the feet.    Lab Results  Component Value Date   HGBA1C 11.4 (H) 06/09/2019   Lab Results  Component Value Date   TSH 0.283 (L) 04/13/2019   T4TOTAL 5.3 10/02/2017        Assessment & Plan:  HTN: is noted today.  Insulin-requiring type 2 DM: she needs increased rx.  Edema: This limits rx options.    Patient Instructions  Your blood pressure is high today.  Please see your primary care provider soon, to have it rechecked check your blood sugar twice a day.  vary the time of day when you check, between before the 3 meals, and at bedtime.  also check if you have symptoms of your blood sugar being too high or too low.  please keep a record of the readings and bring it to your next appointment here (or you can bring the meter itself).  You can write it on any piece of paper.  please call us sooner if your blood sugar goes below 70, or if you have a lot of readings over 200.   please increase the Basaglar to 100 units each morning, and: increase the Fiasp to 20 units 3 times a day (just before each meal).   Please come back for a follow-up appointment in 1 month.

## 2019-07-10 ENCOUNTER — Ambulatory Visit: Payer: BC Managed Care – PPO

## 2019-07-10 DIAGNOSIS — I69351 Hemiplegia and hemiparesis following cerebral infarction affecting right dominant side: Secondary | ICD-10-CM | POA: Diagnosis not present
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
## 2019-07-14 ENCOUNTER — Telehealth: Payer: Self-pay | Admitting: Medical

## 2019-07-14 ENCOUNTER — Other Ambulatory Visit: Payer: Self-pay

## 2019-07-14 ENCOUNTER — Ambulatory Visit: Payer: BC Managed Care – PPO | Attending: Internal Medicine

## 2019-07-14 ENCOUNTER — Other Ambulatory Visit: Payer: Self-pay | Admitting: Medical

## 2019-07-14 DIAGNOSIS — H8111 Benign paroxysmal vertigo, right ear: Secondary | ICD-10-CM | POA: Diagnosis present

## 2019-07-14 DIAGNOSIS — R609 Edema, unspecified: Secondary | ICD-10-CM

## 2019-07-14 DIAGNOSIS — I471 Supraventricular tachycardia: Secondary | ICD-10-CM

## 2019-07-14 DIAGNOSIS — I69351 Hemiplegia and hemiparesis following cerebral infarction affecting right dominant side: Secondary | ICD-10-CM | POA: Insufficient documentation

## 2019-07-14 DIAGNOSIS — R531 Weakness: Secondary | ICD-10-CM

## 2019-07-14 DIAGNOSIS — Z8673 Personal history of transient ischemic attack (TIA), and cerebral infarction without residual deficits: Secondary | ICD-10-CM

## 2019-07-14 NOTE — Telephone Encounter (Signed)
Please handle the official occupational therapy referral.  I had previously given verbal ok for this.  RE: recent stroke, right side weakness

## 2019-07-14 NOTE — Telephone Encounter (Signed)
Done
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
## 2019-07-17 ENCOUNTER — Other Ambulatory Visit: Payer: Self-pay

## 2019-07-17 ENCOUNTER — Ambulatory Visit: Payer: BC Managed Care – PPO

## 2019-07-17 DIAGNOSIS — H8111 Benign paroxysmal vertigo, right ear: Secondary | ICD-10-CM

## 2019-07-17 DIAGNOSIS — I69351 Hemiplegia and hemiparesis following cerebral infarction affecting right dominant side: Secondary | ICD-10-CM
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
## 2019-07-20 ENCOUNTER — Ambulatory Visit (INDEPENDENT_AMBULATORY_CARE_PROVIDER_SITE_OTHER): Payer: BC Managed Care – PPO | Admitting: Adult Health

## 2019-07-20 ENCOUNTER — Encounter: Payer: Self-pay | Admitting: Adult Health

## 2019-07-20 ENCOUNTER — Other Ambulatory Visit: Payer: Self-pay

## 2019-07-20 VITALS — BP 142/83 | HR 84 | Temp 96.6°F | Ht 63.0 in | Wt 255.0 lb

## 2019-07-20 DIAGNOSIS — I10 Essential (primary) hypertension: Secondary | ICD-10-CM

## 2019-07-20 DIAGNOSIS — E1165 Type 2 diabetes mellitus with hyperglycemia: Secondary | ICD-10-CM

## 2019-07-20 DIAGNOSIS — IMO0002 Reserved for concepts with insufficient information to code with codable children: Secondary | ICD-10-CM

## 2019-07-20 DIAGNOSIS — E785 Hyperlipidemia, unspecified: Secondary | ICD-10-CM

## 2019-07-20 DIAGNOSIS — I639 Cerebral infarction, unspecified: Secondary | ICD-10-CM

## 2019-07-20 DIAGNOSIS — I6350 Cerebral infarction due to unspecified occlusion or stenosis of unspecified cerebral artery: Secondary | ICD-10-CM

## 2019-07-20 DIAGNOSIS — E118 Type 2 diabetes mellitus with unspecified complications: Secondary | ICD-10-CM

## 2019-07-20 NOTE — Progress Notes (Signed)
[facility] [address] [place]. [place] [address] [phone]       [name] Date of Birth:  [birth year] Medical Record Number:  [id number]   Reason for Referral:  hospital stroke follow up    SUBJECTIVE:   CHIEF COMPLAINT:  Chief Complaint  Patient presents with  . Hospitalization Follow-up    Tx room here for a f/u froma stroke. Pt is experiencing no new sx and is alone    HPI:   [name] is a 59 y.o. female with history of HTN, DB, OSA, SVT, depression ands chronic pain  who presented on 06/08/2019 with R sided sensory numbness and paresthesias along with vertigo and an unsteady sensation x 3 days.   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  Recommended DAPT for 3 weeks then aspirin alone as previously not on antithrombotic.  History of HTN stable.  History of HLD with LDL 131 and increase atorvastatin dose from 20 mg to 40 mg daily.  History of DM uncontrolled with A1c 11.4.  Other stroke risk factors include diffuse intracranial arthrosclerosis and stenosis, and morbid obesity but no prior stroke history.  Evaluated by therapy who recommended outpatient therapy and discharged home in stable condition.  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   MRI  Acute L paramedian pontine infarct. 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   MRA  No antegrade flow R VA. Retrograde flow to R PICA. Poor R PCA flow d/t d/t narrow and irregular PCom and severe dz P1 from basilar tip.  CTA head & neck distal R V2 stenosis. Proximal R PCom moderate stenosis. L ICA bifurcation atherosclerosis. Diffuse goiter. B cervical ICA tortuosity. Dome L VA origin atherosclerosis. Mild distal small vessel disease.  2D Echo EF 60-65%. No source of embolus   Has loop recorder placed 3 yrs ago by Dr  Olevia Perches - no AF    LDL 131  HgbA1c 11.4  Heparin 5000 units sq tid for VTE prophylaxis  No antithrombotic prior to admission, now on aspirin 81 mg daily. Add plavix. Continue DAPT x 3 weeks then aspirin alone. Added to d/c orders and AVS  Therapy recommendations:  OP PT  Disposition:  Return home  Today, 07/20/2019, [name] is being seen for hospital follow-up.  Residual deficits imbalance with dizziness, and RUE numbness with decreased dexterity.  Continues to work with PT with ongoing improvement as well as Epley maneuver for likely BPPV and plans on starting OT soon. Use of rollator walker for long distance - no AD for short distance. Mild short term memory impairment and delayed recall post stroke.  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  PCP initially recommended returning part-time but unfortunately her current employer will not honor restriction.  Completed 3 weeks DAPT and continues on aspirin alone without bleeding or bruising.  Continues on atorvastatin 40 mg daily without myalgias.  Blood pressure today 142/83. Monitors at home and has been stable. Glucose levels have improved - continues to follow with endocrinology. Prior evaluation for sleep apnea which was negative.  Loop recorder has not shown atrial fibrillation thus far.  No further concerns at this time.      ROS:   14 system review of systems performed and negative with exception of gait impairment,  dizziness, numbness  PMH:  Past Medical History:  Diagnosis Date  . Abscess    increased drainage from abscess on buttock  . Anal fistula   . Anxiety   . Bilateral hip pain 05/27/2015  . Chronic pain syndrome 05/27/2015  . Depression    sees [name]  . Diabetes mellitus without complication ([place])   . Hyperlipidemia   . Hypertension   . Sleep apnea    2008- sleep study, neg. for sleep apnea   . Stroke ([place])   . SVT (supraventricular tachycardia) ([place])    . Symptomatic cholelithiasis 09/11/2018    PSH:  Past Surgical History:  Procedure Laterality Date  . ANAL EXAMINATION UNDER ANESTHESIA  02/21/11   anal fistula  . BREAST SURGERY  patient does not remember date of procedure   pull fluid off lft br  . CHOLECYSTECTOMY N/A 09/12/2018   Procedure: LAPAROSCOPIC CHOLECYSTECTOMY;  Surgeon: Stark Klein, MD;  Location: [name];  Service: General;  Laterality: N/A;  . ELECTROPHYSIOLOGIC STUDY N/A 05/05/2015   Procedure: SVT Ablation;  Surgeon: Will Meredith Leeds, MD;  Location: [facility];  Service: Cardiovascular;  Laterality: N/A;  . EP IMPLANTABLE DEVICE N/A 01/30/2016   Procedure: Loop Recorder Insertion;  Surgeon: Sanda Klein, MD;  Location: [facility];  Service: Cardiovascular;  Laterality: N/A;  . INCISE AND DRAIN ABCESS     abscess on right thigh and buttock  . KNEE ARTHROSCOPY     left  . LAPAROSCOPIC APPENDECTOMY N/A 09/19/2018   Procedure: APPENDECTOMY LAPAROSCOPIC;  Surgeon: Clovis Riley, MD;  Location: [facility];  Service: General;  Laterality: N/A;  . SHOULDER SURGERY  04/14/09   right    Social History:  Social History   Socioeconomic History  . Marital status: Divorced    Spouse name: Not on file  . Number of children: 0  . Years of education: Not on file  . Highest education level: Not on file  Occupational History  . Occupation: TEACHER ASSISTANT  Tobacco Use  . Smoking status: Never Smoker  . Smokeless tobacco: Never Used  Substance and Sexual Activity  . Alcohol use: No  . Drug use: No  . Sexual activity: Yes  Other Topics Concern  . Not on file  Social History Narrative  . Not on file   Social Determinants of Health   Financial Resource Strain:   . Difficulty of Paying Living Expenses:   Food Insecurity:   . Worried About Charity fundraiser in the Last Year:   . Arboriculturist in the Last Year:   Transportation Needs:   . Film/video editor (Medical):   Marland Kitchen Lack of Transportation  (Non-Medical):   Physical Activity:   . Days of Exercise per Week:   . Minutes of Exercise per Session:   Stress:   . Feeling of Stress :   Social Connections:   . Frequency of Communication with Friends and Family:   . Frequency of Social Gatherings with Friends and Family:   . Attends Religious Services:   . Active Member of Clubs or Organizations:   . Attends Archivist Meetings:   Marland Kitchen Marital Status:   Intimate Partner Violence:   . Fear of Current or Ex-Partner:   . Emotionally Abused:   Marland Kitchen Physically Abused:   . Sexually Abused:     Family History:  Family History  Problem Relation Age of Onset  . Hypertension Mother   . Alzheimer's disease Mother   .  Diabetes Father   . Breast cancer Sister 66  . Anesthesia problems Neg Hx   . Hypotension Neg Hx   . Malignant hyperthermia Neg Hx   . Pseudochol deficiency Neg Hx   . Colon cancer Neg Hx   . Esophageal cancer Neg Hx   . Stomach cancer Neg Hx   . Rectal cancer Neg Hx   . Thyroid disease Neg Hx     Medications:   Current Outpatient Medications on File Prior to Visit  Medication Sig Dispense Refill  . aspirin 81 MG EC tablet Take 1 tablet (81 mg total) by mouth daily.    Marland Kitchen atorvastatin (LIPITOR) 40 MG tablet Take 1 tablet (40 mg total) by mouth at bedtime. 30 tablet 0  . carvedilol (COREG) 6.25 MG tablet Take 1 tablet by mouth twice daily 180 tablet 0  . DEXILANT 60 MG capsule Take 1 capsule by mouth once daily 90 capsule 0  . diclofenac (VOLTAREN) 50 MG EC tablet Take 1 tablet by mouth twice daily 180 tablet 2  . DULoxetine (CYMBALTA) 60 MG capsule Take 1 capsule by mouth twice daily 180 capsule 0  . FARXIGA 5 MG TABS tablet Take 5 mg by mouth every morning. 90 tablet 0  . ferrous gluconate (FERGON) 324 MG tablet Take 1 tablet (324 mg total) by mouth 2 (two) times daily with a meal. 90 tablet 0  . furosemide (LASIX) 80 MG tablet Take 1 tablet by mouth once daily 90 tablet 0  . HYDROcodone-acetaminophen  (NORCO) 10-325 MG tablet Take 1 tablet by mouth every 6 (six) hours as needed. Please do not  Fill before 07/30/2019 120 tablet 0  . insulin aspart (FIASP FLEXTOUCH) 100 UNIT/ML FlexTouch Pen Inject 20 Units into the skin 3 (three) times daily with meals. 20 pen 3  . Insulin Glargine (BASAGLAR KWIKPEN) 100 UNIT/ML Inject 1 mL (100 Units total) into the skin every morning. And pen needles 1/day 40 pen 3  . LORazepam (ATIVAN) 0.5 MG tablet Take 1 tablet (0.5 mg total) by mouth 2 (two) times daily as needed. for anxiety 60 tablet 0  . nortriptyline (PAMELOR) 25 MG capsule Take 1 capsule by mouth at bedtime 30 capsule 2  . potassium chloride (KLOR-CON) 10 MEQ tablet Take 1 tablet (10 mEq total) by mouth daily. 90 tablet 1   No current facility-administered medications on file prior to visit.    Allergies:   Allergies  Allergen Reactions  . Ozempic (0.25 Or 0.5 Mg-Dose) [Semaglutide(0.25 Or 0.5mg -Dos)]     Severe nausea, bad gas  . Tramadol Nausea Only      OBJECTIVE:  Physical Exam  Vitals:   07/20/19 1507  BP: (!) 142/83  Pulse: 84  Temp: (!) 96.6 F (35.9 C)  Weight: 255 lb (115.7 kg)  Height: 5\' 3"  (1.6 m)   Body mass index is 45.17 kg/m. No exam data present  Depression screen [facility] 2/9 07/06/2019  Decreased Interest 1  Down, Depressed, Hopeless 1  PHQ - 2 Score 2  Altered sleeping 1  Tired, decreased energy 2  Change in appetite 0  Feeling bad or failure about yourself  0  Trouble concentrating 1  Moving slowly or fidgety/restless 0  Suicidal thoughts 0  PHQ-9 Score 6  Difficult doing work/chores Not difficult at all  Some recent data might be hidden     General: Morbidly obese very pleasant middle-age female, seated, in no evident distress Head: head normocephalic and atraumatic.   Neck: supple with no  carotid or supraclavicular bruits Cardiovascular: irregular rate and rhythm, no murmurs Musculoskeletal: no deformity Skin:  no rash/petichiae Vascular:   Normal pulses all extremities   Neurologic Exam Mental Status: Awake and fully alert.  Fluent speech and language. Oriented to place and time. Recent memory subjectively impaired and remote memory intact. Attention span, concentration and fund of knowledge appropriate during visit. Mood and affect appropriate.  Cranial Nerves: Fundoscopic exam reveals sharp disc margins. Pupils equal, briskly reactive to light. Extraocular movements full without nystagmus. Visual fields full to confrontation. Hearing intact. Facial sensation intact. Face, tongue, palate moves normally and symmetrically.  Motor: Normal bulk and tone. Normal strength in all tested extremity muscles except slightly decreased right hand dexterity and grip strength. Sensory.:  Decreased sensation right upper and lower extremity compared to left Coordination: Rapid alternating movements normal in all extremities except slightly decreased right hand. Finger-to-nose and heel-to-shin performed accurately bilaterally.  Orbits left arm over right arm Gait and Station: Arises from chair without difficulty. Stance is normal. Gait demonstrates normal stride length and balance with use of Rollator walker Reflexes: 1+ and symmetric. Toes downgoing.     NIHSS  1 Modified Rankin  2     ASSESSMENT: [name] is a 59 y.o. 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 Vascular risk factors include HTN, HLD, uncontrolled DM, diffuse intracranial arthrosclerosis and stenosis and morbid obesity.  Residual deficits of mild RUE weakness, hemisensory impairment, gait impairment and dizziness.  Continues to work with outpatient therapy with ongoing improvement as well as recent diagnosis of BPPV with improvement after Epley maneuver     PLAN:  1. Left  paramedial pontine stroke:  -Residual deficits: Ongoing participation in outpatient PT as well as initiating OT.  Due to ongoing dizziness and gait impairment, would recommend ongoing FMLA at least for additional 1 to 2 months for ongoing recovery time.  Advised patient to continue to follow with PCP for ongoing assistance -Continue aspirin 81 mg daily  and atorvastatin 40 mg daily for secondary stroke prevention.  -Maintain strict control of hypertension with blood pressure goal below 130/90, diabetes with hemoglobin A1c goal below 6.5% and cholesterol with LDL cholesterol (bad cholesterol) goal below 70 mg/dL.  I also advised the patient to eat a healthy diet with plenty of whole grains, cereals, fruits and vegetables, exercise regularly with at least 30 minutes of continuous activity daily and maintain ideal body weight. 2. HTN: Stable.  Continue to follow with PCP for monitoring management 3. HLD: Continue atorvastatin with plans on following up with PCP for repeat lipid panel as well as ongoing prescribing, monitoring and management 4. DMII: Reports recent improvement of glucose levels.  Continue to follow with endocrinology for ongoing monitoring and management 5. Irregular rhythm: Irregular rhythm during today's visit at 1545.  Unable to perform EKG in office.  Will reach out to cardiology for further evaluation as she currently has a loop recorder placed.  Asymptomatic.    Follow up in 3 months or call earlier if needed   I spent 50 minutes of face-to-face and non-face-to-face time with patient.  This included previsit chart review, lab review, study review, order entry, electronic health record documentation, patient education regarding recent stroke, residual deficits, importance of managing stroke risk factors and answered all questions to patient satisfaction     Frann Rider,  AGNP-BC  [facility] [address] [place], [place] [address]  Phone  [phone] Fax [phone] Note: This document was prepared with digital dictation and possible smart phrase technology. Any transcriptional errors that result from this process are unintentional.

## 2019-07-20 NOTE — Patient Instructions (Signed)
Continue to work with therapy for hopeful ongoing improvement   Continue aspirin 81 mg daily  and atorvastatin 40mg  daily  for secondary stroke prevention  Continue to follow up with PCP regarding cholesterol, blood pressure and diabetes management   Recommend ongoing FLMA for ongoing recovery time - additional 2 months should be plenty of time for recovery period - this can be completed by your PCP as they are currently assisting with this  Continue to monitor blood pressure at home  Maintain strict control of hypertension with blood pressure goal below 130/90, diabetes with hemoglobin A1c goal below 6.5% and cholesterol with LDL cholesterol (bad cholesterol) goal below 70 mg/dL. I also advised the patient to eat a healthy diet with plenty of whole grains, cereals, fruits and vegetables, exercise regularly and maintain ideal body weight.  Followup in the future with me in 3 months or call earlier if needed       Thank you for coming to see [place] at [facility]. I hope we have been able to provide you high quality care today.  You may receive a patient satisfaction survey over the next few weeks. We would appreciate your feedback and comments so that we may continue to improve ourselves and the health of our patients.

## 2019-07-21 ENCOUNTER — Ambulatory Visit: Payer: BC Managed Care – PPO

## 2019-07-21 ENCOUNTER — Encounter: Payer: Self-pay | Admitting: Adult Health

## 2019-07-21 ENCOUNTER — Encounter: Payer: Self-pay | Admitting: *Deleted

## 2019-07-21 ENCOUNTER — Telehealth: Payer: Self-pay | Admitting: Adult Health

## 2019-07-21 NOTE — Telephone Encounter (Signed)
We will to obtain FMLA paperwork that was initially completed by PCP and make addendum to return date or will process have to be started over?

## 2019-07-21 NOTE — Telephone Encounter (Signed)
Letter was done.  Spoke to pt and she will pick up tomorrow.  Placed up front for pick up.

## 2019-07-21 NOTE — Progress Notes (Signed)
I agree with the above plan 

## 2019-07-21 NOTE — Telephone Encounter (Signed)
Pt called stating that her PCP does not feel comfortable writing the letter for her to stay out of work for additional time and she is needing this letter within 48hrs from when she sees the provider. Please advise.

## 2019-07-23 ENCOUNTER — Ambulatory Visit: Payer: BC Managed Care – PPO

## 2019-07-23 LAB — CUP PACEART REMOTE DEVICE CHECK
Date Time Interrogation Session: 20210512025330
Implantable Pulse Generator Implant Date: 20171120

## 2019-07-27 ENCOUNTER — Ambulatory Visit (INDEPENDENT_AMBULATORY_CARE_PROVIDER_SITE_OTHER): Payer: BC Managed Care – PPO | Admitting: *Deleted

## 2019-07-27 DIAGNOSIS — I63019 Cerebral infarction due to thrombosis of unspecified vertebral artery: Secondary | ICD-10-CM

## 2019-07-28 ENCOUNTER — Other Ambulatory Visit: Payer: Self-pay

## 2019-07-28 ENCOUNTER — Other Ambulatory Visit: Payer: Self-pay | Admitting: Medical

## 2019-07-28 ENCOUNTER — Other Ambulatory Visit: Payer: Self-pay | Admitting: Registered Nurse

## 2019-07-28 ENCOUNTER — Telehealth: Payer: Self-pay

## 2019-07-28 ENCOUNTER — Ambulatory Visit: Payer: BC Managed Care – PPO

## 2019-07-28 DIAGNOSIS — G894 Chronic pain syndrome: Secondary | ICD-10-CM

## 2019-07-28 DIAGNOSIS — I69351 Hemiplegia and hemiparesis following cerebral infarction affecting right dominant side: Secondary | ICD-10-CM | POA: Diagnosis not present

## 2019-07-28 DIAGNOSIS — H8111 Benign paroxysmal vertigo, right ear: Secondary | ICD-10-CM

## 2019-07-28 MED ORDER — GLUCOSE BLOOD VI STRP: ORAL_STRIP | 12 refills | Status: DC

## 2019-07-28 NOTE — Telephone Encounter (Signed)
Test strips sent per patient request  

## 2019-07-28 NOTE — Progress Notes (Signed)
[facility] Summary Report / Loop Recorder 
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
## 2019-07-30 ENCOUNTER — Ambulatory Visit: Payer: BC Managed Care – PPO

## 2019-07-30 ENCOUNTER — Other Ambulatory Visit: Payer: Self-pay

## 2019-07-30 DIAGNOSIS — H8111 Benign paroxysmal vertigo, right ear: Secondary | ICD-10-CM

## 2019-07-30 DIAGNOSIS — I69351 Hemiplegia and hemiparesis following cerebral infarction affecting right dominant side: Secondary | ICD-10-CM | POA: Diagnosis not present

## 2019-07-30 LAB — HM DIABETES EYE EXAM
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
## 2019-08-03 ENCOUNTER — Ambulatory Visit: Payer: BC Managed Care – PPO | Admitting: Occupational Therapy

## 2019-08-04 ENCOUNTER — Ambulatory Visit: Payer: BC Managed Care – PPO

## 2019-08-06 ENCOUNTER — Ambulatory Visit: Payer: BC Managed Care – PPO

## 2019-08-06 ENCOUNTER — Other Ambulatory Visit: Payer: Self-pay

## 2019-08-06 DIAGNOSIS — H8111 Benign paroxysmal vertigo, right ear: Secondary | ICD-10-CM

## 2019-08-06 DIAGNOSIS — I69351 Hemiplegia and hemiparesis following cerebral infarction affecting right dominant side: Secondary | ICD-10-CM
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
## 2019-08-11 ENCOUNTER — Ambulatory Visit (INDEPENDENT_AMBULATORY_CARE_PROVIDER_SITE_OTHER): Payer: BC Managed Care – PPO | Admitting: Endocrinology

## 2019-08-11 ENCOUNTER — Encounter: Payer: Self-pay | Admitting: Endocrinology

## 2019-08-11 ENCOUNTER — Other Ambulatory Visit: Payer: Self-pay

## 2019-08-11 VITALS — BP 148/72 | HR 94 | Ht 63.0 in | Wt 259.0 lb

## 2019-08-11 DIAGNOSIS — E118 Type 2 diabetes mellitus with unspecified complications: Secondary | ICD-10-CM | POA: Diagnosis not present

## 2019-08-11 DIAGNOSIS — E1165 Type 2 diabetes mellitus with hyperglycemia: Secondary | ICD-10-CM | POA: Diagnosis not present

## 2019-08-11 DIAGNOSIS — IMO0002 Reserved for concepts with insufficient information to code with codable children: Secondary | ICD-10-CM

## 2019-08-11 LAB — POCT GLYCOSYLATED HEMOGLOBIN (HGB A1C): Hemoglobin A1C: 10.6 % — AB (ref 4.0–5.6)

## 2019-08-11 MED ORDER — FIASP FLEXTOUCH 100 UNIT/ML ~~LOC~~ SOPN: 30.0000 [IU] | PEN_INJECTOR | Freq: Three times a day (TID) | SUBCUTANEOUS | 3 refills | Status: DC

## 2019-08-11 MED ORDER — BASAGLAR KWIKPEN 100 UNIT/ML ~~LOC~~ SOPN: 90.0000 [IU] | PEN_INJECTOR | SUBCUTANEOUS | 3 refills | Status: DC

## 2019-08-11 NOTE — Patient Instructions (Addendum)
Your blood pressure is high today.  Please see your primary care provider soon, to have it rechecked check your blood sugar twice a day.  vary the time of day when you check, between before the 3 meals, and at bedtime.  also check if you have symptoms of your blood sugar being too high or too low.  please keep a record of the readings and bring it to your next appointment here (or you can bring the meter itself).  You can write it on any piece of paper.  please call us sooner if your blood sugar goes below 70, or if you have a lot of readings over 200.   please reduce the Basaglar to 90 units each morning, and: increase the Fiasp to 30 units 3 times a day (just before each meal).   Please come back for a follow-up appointment in 6 weeks.

## 2019-08-11 NOTE — Progress Notes (Signed)
Subjective:    Patient ID: [name], female    DOB: 26-Jun-1960, 59 y.o.   MRN: [id number]  HPI Pt returns for f/u of diabetes mellitus: DM type: Insulin-requiring type 2 Dx'ed: 0000000 Complications: CRI and CVA Therapy: insulin since 2016, and Farxiga.   GDM: never (G0) DKA: never Severe hypoglycemia: never Pancreatitis: never Pancreatic imaging: normal on 2018 CT Other: she takes multiple daily injections; she did not tolerate Ozempic (nausea).  Interval history: no cbg record, but states fasting cbg's vary from 107-258.  It is in general higher as the day goes on.  Pt says she never misses the insulin.   She also has multinodular goiter (dx'ed 2017; bx in 2018, of 2 nodules: Beth cat 1; f/u US in 2020 was unchanged, and showed none meet criteria for biopsy or follow-up; TSH is slightly suppressed).   Past Medical History:  Diagnosis Date  . Abscess    increased drainage from abscess on buttock  . Anal fistula   . Anxiety   . Bilateral hip pain 05/27/2015  . Chronic pain syndrome 05/27/2015  . Depression    sees [name]  . Diabetes mellitus without complication ([place])   . Hyperlipidemia   . Hypertension   . Sleep apnea    2008- sleep study, neg. for sleep apnea   . Stroke ([place])   . SVT (supraventricular tachycardia) ([place])   . Symptomatic cholelithiasis 09/11/2018    Past Surgical History:  Procedure Laterality Date  . ANAL EXAMINATION UNDER ANESTHESIA  02/21/11   anal fistula  . BREAST SURGERY  patient does not remember date of procedure   pull fluid off lft br  . CHOLECYSTECTOMY N/A 09/12/2018   Procedure: LAPAROSCOPIC CHOLECYSTECTOMY;  Surgeon: Stark Klein, MD;  Location: [place];  Service: General;  Laterality: N/A;  . ELECTROPHYSIOLOGIC STUDY N/A 05/05/2015   Procedure: SVT Ablation;  Surgeon: Will Meredith Leeds, MD;  Location: [place] [place] [facility];  Service: Cardiovascular;  Laterality: N/A;  . EP IMPLANTABLE DEVICE N/A 01/30/2016   Procedure: Loop Recorder  Insertion;  Surgeon: Sanda Klein, MD;  Location: [facility];  Service: Cardiovascular;  Laterality: N/A;  . INCISE AND DRAIN ABCESS     abscess on right thigh and buttock  . KNEE ARTHROSCOPY     left  . LAPAROSCOPIC APPENDECTOMY N/A 09/19/2018   Procedure: APPENDECTOMY LAPAROSCOPIC;  Surgeon: Clovis Riley, MD;  Location: [place];  Service: General;  Laterality: N/A;  . SHOULDER SURGERY  04/14/09   right    Social History   Socioeconomic History  . Marital status: Divorced    Spouse name: Not on file  . Number of children: 0  . Years of education: Not on file  . Highest education level: Not on file  Occupational History  . Occupation: TEACHER ASSISTANT  Tobacco Use  . Smoking status: Never Smoker  . Smokeless tobacco: Never Used  Substance and Sexual Activity  . Alcohol use: No  . Drug use: No  . Sexual activity: Yes  Other Topics Concern  . Not on file  Social History Narrative  . Not on file   Social Determinants of Health   Financial Resource Strain:   . Difficulty of Paying Living Expenses:   Food Insecurity:   . Worried About Charity fundraiser in the Last Year:   . Arboriculturist in the Last Year:   Transportation Needs:   . Film/video editor (Medical):   Marland Kitchen Lack of Transportation (  Non-Medical):   Physical Activity:   . Days of Exercise per Week:   . Minutes of Exercise per Session:   Stress:   . Feeling of Stress :   Social Connections:   . Frequency of Communication with Friends and Family:   . Frequency of Social Gatherings with Friends and Family:   . Attends Religious Services:   . Active Member of Clubs or Organizations:   . Attends Archivist Meetings:   Marland Kitchen Marital Status:   Intimate Partner Violence:   . Fear of Current or Ex-Partner:   . Emotionally Abused:   Marland Kitchen Physically Abused:   . Sexually Abused:     Current Outpatient Medications on File Prior to Visit  Medication Sig Dispense Refill  . aspirin 81 MG EC  tablet Take 1 tablet (81 mg total) by mouth daily.    Marland Kitchen atorvastatin (LIPITOR) 40 MG tablet Take 1 tablet (40 mg total) by mouth at bedtime. 30 tablet 0  . carvedilol (COREG) 6.25 MG tablet Take 1 tablet by mouth twice daily 180 tablet 0  . DEXILANT 60 MG capsule Take 1 capsule by mouth once daily 90 capsule 0  . diclofenac (VOLTAREN) 50 MG EC tablet Take 1 tablet by mouth twice daily 180 tablet 2  . DULoxetine (CYMBALTA) 60 MG capsule Take 1 capsule by mouth twice daily 180 capsule 0  . FARXIGA 5 MG TABS tablet Take 5 mg by mouth every morning. 90 tablet 0  . Ferrous Gluconate 324 (37.5 Fe) MG TABS TAKE 1 TABLET BY MOUTH TWICE DAILY WITH  A  MEAL 60 tablet 0  . furosemide (LASIX) 80 MG tablet Take 1 tablet by mouth once daily 90 tablet 0  . glucose blood test strip ONE TOUCH VERIO TEST STRIPS TO TEST 1-2 TIMES A DAY 100 each 12  . HYDROcodone-acetaminophen (NORCO) 10-325 MG tablet Take 1 tablet by mouth every 6 (six) hours as needed. Please do not  Fill before 07/30/2019 120 tablet 0  . LORazepam (ATIVAN) 0.5 MG tablet Take 1 tablet (0.5 mg total) by mouth 2 (two) times daily as needed. for anxiety 60 tablet 0  . nortriptyline (PAMELOR) 25 MG capsule Take 1 capsule by mouth at bedtime 30 capsule 0  . potassium chloride (KLOR-CON) 10 MEQ tablet Take 1 tablet (10 mEq total) by mouth daily. 90 tablet 1   No current facility-administered medications on file prior to visit.    Allergies  Allergen Reactions  . Ozempic (0.25 Or 0.5 Mg-Dose) [Semaglutide(0.25 Or 0.5mg -Dos)]     Severe nausea, bad gas  . Tramadol Nausea Only    Family History  Problem Relation Age of Onset  . Hypertension Mother   . Alzheimer's disease Mother   . Diabetes Father   . Breast cancer Sister 48  . Anesthesia problems Neg Hx   . Hypotension Neg Hx   . Malignant hyperthermia Neg Hx   . Pseudochol deficiency Neg Hx   . Colon cancer Neg Hx   . Esophageal cancer Neg Hx   . Stomach cancer Neg Hx   . Rectal cancer  Neg Hx   . Thyroid disease Neg Hx     BP (!) 148/72   Pulse 94   Ht 5\' 3"  (1.6 m)   Wt 259 lb (117.5 kg)   LMP 02/09/2017   SpO2 95%   BMI 45.88 kg/m    Review of Systems She denies hypoglycemia.      Objective:   Physical Exam VITAL SIGNS:  See vs page GENERAL: no distress Pulses: dorsalis pedis intact bilat.   MSK: no deformity of the feet CV: trace bilat leg edema Skin:  no ulcer on the feet.  normal color and temp on the feet. Neuro: sensation is intact to touch on the feet.    Lab Results  Component Value Date   HGBA1C 10.6 (A) 08/11/2019       Assessment & Plan:  Insulin-requiring type 2 DM, with CVA: not well-controlled.  she declines to change to QD insulin HTN: is noted today.   Patient Instructions  Your blood pressure is high today.  Please see your primary care provider soon, to have it rechecked check your blood sugar twice a day.  vary the time of day when you check, between before the 3 meals, and at bedtime.  also check if you have symptoms of your blood sugar being too high or too low.  please keep a record of the readings and bring it to your next appointment here (or you can bring the meter itself).  You can write it on any piece of paper.  please call us sooner if your blood sugar goes below 70, or if you have a lot of readings over 200.   please reduce the Basaglar to 90 units each morning, and: increase the Fiasp to 30 units 3 times a day (just before each meal).   Please come back for a follow-up appointment in 6 weeks.

## 2019-08-12 ENCOUNTER — Other Ambulatory Visit: Payer: Self-pay

## 2019-08-12 ENCOUNTER — Telehealth: Payer: Self-pay

## 2019-08-12 DIAGNOSIS — IMO0002 Reserved for concepts with insufficient information to code with codable children: Secondary | ICD-10-CM

## 2019-08-12 MED ORDER — NOVOLOG FLEXPEN 100 UNIT/ML ~~LOC~~ SOPN: 30.0000 [IU] | PEN_INJECTOR | Freq: Three times a day (TID) | SUBCUTANEOUS | 0 refills | Status: DC

## 2019-08-12 NOTE — Telephone Encounter (Signed)
Received notification from [facility] stating Claiborne Billings is not covered by pt plan and that Novolog Flexpen is preferred. Discussed with Dr. Loanne Drilling. Ordered Rx be changed to [employer], same dosage as Fiasp. New Rx written as below and sent electronically to [facility].  Outpatient Medication Detail   Disp Refills Start End   insulin aspart (NOVOLOG FLEXPEN) 100 UNIT/ML FlexPen 81 mL 0 08/12/2019    Sig - Route: Inject 30 Units into the skin 3 (three) times daily with meals. - Subcutaneous   Sent to pharmacy as: insulin aspart (NOVOLOG FLEXPEN) 100 UNIT/ML FlexPen   E-Prescribing Status: Receipt confirmed by pharmacy (08/12/2019 11:48 AM EDT)

## 2019-08-13 ENCOUNTER — Other Ambulatory Visit: Payer: Self-pay

## 2019-08-14 MED ORDER — FARXIGA 5 MG PO TABS: 5.0000 mg | ORAL_TABLET | Freq: Every morning | ORAL | 0 refills | Status: DC

## 2019-08-21 MED ORDER — ATORVASTATIN CALCIUM 40 MG PO TABS: 40.0000 mg | ORAL_TABLET | Freq: Every day | ORAL | 0 refills | Status: DC

## 2019-08-24 ENCOUNTER — Other Ambulatory Visit: Payer: Self-pay | Admitting: Medical

## 2019-08-24 ENCOUNTER — Ambulatory Visit: Payer: BC Managed Care – PPO | Admitting: Occupational Therapy

## 2019-08-24 MED ORDER — FERROUS GLUCONATE 324 (37.5 FE) MG PO TABS: ORAL_TABLET | ORAL | 0 refills | Status: DC

## 2019-08-27 ENCOUNTER — Encounter: Payer: BC Managed Care – PPO | Admitting: Registered Nurse

## 2019-08-31 ENCOUNTER — Ambulatory Visit (INDEPENDENT_AMBULATORY_CARE_PROVIDER_SITE_OTHER): Payer: BC Managed Care – PPO | Admitting: *Deleted

## 2019-08-31 DIAGNOSIS — R55 Syncope and collapse: Secondary | ICD-10-CM | POA: Diagnosis not present

## 2019-08-31 LAB — CUP PACEART REMOTE DEVICE CHECK
Date Time Interrogation Session: 20210621003540
Implantable Pulse Generator Implant Date: 20171120

## 2019-09-01 ENCOUNTER — Encounter: Payer: Self-pay | Admitting: Registered Nurse

## 2019-09-01 ENCOUNTER — Encounter: Payer: BC Managed Care – PPO | Attending: Physical Medicine & Rehabilitation | Admitting: Registered Nurse

## 2019-09-01 ENCOUNTER — Other Ambulatory Visit: Payer: Self-pay

## 2019-09-01 VITALS — BP 110/70 | HR 79 | Temp 97.0°F | Ht 63.0 in | Wt 263.0 lb

## 2019-09-01 DIAGNOSIS — Z8249 Family history of ischemic heart disease and other diseases of the circulatory system: Secondary | ICD-10-CM | POA: Diagnosis not present

## 2019-09-01 DIAGNOSIS — M25552 Pain in left hip: Secondary | ICD-10-CM | POA: Diagnosis not present

## 2019-09-01 DIAGNOSIS — G4730 Sleep apnea, unspecified: Secondary | ICD-10-CM | POA: Diagnosis not present

## 2019-09-01 DIAGNOSIS — Z809 Family history of malignant neoplasm, unspecified: Secondary | ICD-10-CM | POA: Insufficient documentation

## 2019-09-01 DIAGNOSIS — M79641 Pain in right hand: Secondary | ICD-10-CM | POA: Insufficient documentation

## 2019-09-01 DIAGNOSIS — G8929 Other chronic pain: Secondary | ICD-10-CM | POA: Insufficient documentation

## 2019-09-01 DIAGNOSIS — M5416 Radiculopathy, lumbar region: Secondary | ICD-10-CM | POA: Diagnosis not present

## 2019-09-01 DIAGNOSIS — G894 Chronic pain syndrome: Secondary | ICD-10-CM | POA: Insufficient documentation

## 2019-09-01 DIAGNOSIS — F418 Other specified anxiety disorders: Secondary | ICD-10-CM | POA: Diagnosis not present

## 2019-09-01 DIAGNOSIS — E119 Type 2 diabetes mellitus without complications: Secondary | ICD-10-CM | POA: Diagnosis not present

## 2019-09-01 DIAGNOSIS — I471 Supraventricular tachycardia: Secondary | ICD-10-CM | POA: Insufficient documentation

## 2019-09-01 DIAGNOSIS — Z5181 Encounter for therapeutic drug level monitoring: Secondary | ICD-10-CM

## 2019-09-01 DIAGNOSIS — M25551 Pain in right hip: Secondary | ICD-10-CM | POA: Diagnosis not present

## 2019-09-01 DIAGNOSIS — M7062 Trochanteric bursitis, left hip: Secondary | ICD-10-CM

## 2019-09-01 DIAGNOSIS — M7061 Trochanteric bursitis, right hip: Secondary | ICD-10-CM

## 2019-09-01 DIAGNOSIS — Z833 Family history of diabetes mellitus: Secondary | ICD-10-CM | POA: Diagnosis not present

## 2019-09-01 DIAGNOSIS — I10 Essential (primary) hypertension: Secondary | ICD-10-CM | POA: Diagnosis not present

## 2019-09-01 DIAGNOSIS — Z79891 Long term (current) use of opiate analgesic: Secondary | ICD-10-CM

## 2019-09-01 MED ORDER — HYDROCODONE-ACETAMINOPHEN 10-325 MG PO TABS: 1.0000 | ORAL_TABLET | Freq: Four times a day (QID) | ORAL | 0 refills | Status: DC | PRN

## 2019-09-01 MED ORDER — NORTRIPTYLINE HCL 25 MG PO CAPS: 25.0000 mg | ORAL_CAPSULE | Freq: Every day | ORAL | 1 refills | Status: DC

## 2019-09-01 NOTE — Progress Notes (Signed)
[facility] Summary Report / Loop Recorder 

## 2019-09-01 NOTE — Progress Notes (Signed)
Subjective:    Patient ID: [name], female    DOB: Jan 09, 1961, 59 y.o.   MRN: [id number]  HPI: [name] is a 59 y.o. female who returns for follow up appointment for chronic pain and medication refill. She states her pain is located in her lower back radiating into her buttocks and bilateral hips. She rates her pain 6. Her  current exercise regime is walking and performing stretching exercises.  Ms. Clutter Morphine equivalent is 40.00 MME.  Oral Swab was Performed today.   Pain Inventory Average Pain 7 Pain Right Now 6 My pain is sharp, dull and tingling  In the last 24 hours, has pain interfered with the following? General activity 8 Relation with others 10 Enjoyment of life 10 What TIME of day is your pain at its worst? evening Sleep (in general) Fair  Pain is worse with: walking, bending and standing Pain improves with: rest, heat/ice and medication Relief from Meds: 5  Mobility walk without assistance  Function employed # of hrs/week 0  Neuro/Psych No problems in this area  Prior Studies Any changes since last visit?  no  Physicians involved in your care Any changes since last visit?  no   Family History  Problem Relation Age of Onset  . Hypertension Mother   . Alzheimer's disease Mother   . Diabetes Father   . Breast cancer Sister 66  . Anesthesia problems Neg Hx   . Hypotension Neg Hx   . Malignant hyperthermia Neg Hx   . Pseudochol deficiency Neg Hx   . Colon cancer Neg Hx   . Esophageal cancer Neg Hx   . Stomach cancer Neg Hx   . Rectal cancer Neg Hx   . Thyroid disease Neg Hx    Social History   Socioeconomic History  . Marital status: Divorced    Spouse name: Not on file  . Number of children: 0  . Years of education: Not on file  . Highest education level: Not on file  Occupational History  . Occupation: TEACHER ASSISTANT  Tobacco Use  . Smoking status: Never Smoker  . Smokeless tobacco: Never Used  Vaping Use   . Vaping Use: Never used  Substance and Sexual Activity  . Alcohol use: No  . Drug use: No  . Sexual activity: Yes  Other Topics Concern  . Not on file  Social History Narrative  . Not on file   Social Determinants of Health   Financial Resource Strain:   . Difficulty of Paying Living Expenses:   Food Insecurity:   . Worried About Charity fundraiser in the Last Year:   . Arboriculturist in the Last Year:   Transportation Needs:   . Film/video editor (Medical):   Marland Kitchen Lack of Transportation (Non-Medical):   Physical Activity:   . Days of Exercise per Week:   . Minutes of Exercise per Session:   Stress:   . Feeling of Stress :   Social Connections:   . Frequency of Communication with Friends and Family:   . Frequency of Social Gatherings with Friends and Family:   . Attends Religious Services:   . Active Member of Clubs or Organizations:   . Attends Archivist Meetings:   Marland Kitchen Marital Status:    Past Surgical History:  Procedure Laterality Date  . ANAL EXAMINATION UNDER ANESTHESIA  02/21/11   anal fistula  . BREAST SURGERY  patient does not remember date of procedure   pull  fluid off lft br  . CHOLECYSTECTOMY N/A 09/12/2018   Procedure: LAPAROSCOPIC CHOLECYSTECTOMY;  Surgeon: Stark Klein, MD;  Location: [place];  Service: General;  Laterality: N/A;  . ELECTROPHYSIOLOGIC STUDY N/A 05/05/2015   Procedure: SVT Ablation;  Surgeon: Will Meredith Leeds, MD;  Location: [place] [facility];  Service: Cardiovascular;  Laterality: N/A;  . EP IMPLANTABLE DEVICE N/A 01/30/2016   Procedure: Loop Recorder Insertion;  Surgeon: Sanda Klein, MD;  Location: [place] [facility];  Service: Cardiovascular;  Laterality: N/A;  . INCISE AND DRAIN ABCESS     abscess on right thigh and buttock  . KNEE ARTHROSCOPY     left  . LAPAROSCOPIC APPENDECTOMY N/A 09/19/2018   Procedure: APPENDECTOMY LAPAROSCOPIC;  Surgeon: Clovis Riley, MD;  Location: [place];  Service: General;  Laterality:  N/A;  . SHOULDER SURGERY  04/14/09   right   Past Medical History:  Diagnosis Date  . Abscess    increased drainage from abscess on buttock  . Anal fistula   . Anxiety   . Bilateral hip pain 05/27/2015  . Chronic pain syndrome 05/27/2015  . Depression    sees [name]  . Diabetes mellitus without complication ([place])   . Hyperlipidemia   . Hypertension   . Sleep apnea    2008- sleep study, neg. for sleep apnea   . Stroke ([place])   . SVT (supraventricular tachycardia) ([place])   . Symptomatic cholelithiasis 09/11/2018   BP 110/70   Pulse 79   Temp (!) 97 F (36.1 C)   Ht 5\' 3"  (1.6 m)   Wt 263 lb (119.3 kg)   LMP 02/09/2017   SpO2 96%   BMI 46.59 kg/m   Opioid Risk Score:   Fall Risk Score:  `1  Depression screen PHQ 2/9  Depression screen [facility] 2/9 07/06/2019 06/30/2019 10/28/2017 10/02/2017 07/15/2017 05/23/2017 04/12/2017  Decreased Interest 1 0 0 0 0 1 3  Down, Depressed, Hopeless 1 0 0 3 0 1 2  PHQ - 2 Score 2 0 0 3 0 2 5  Altered sleeping 1 - - 1 - 0 3  Tired, decreased energy 2 - - 0 - 1 3  Change in appetite 0 - - 1 - 0 2  Feeling bad or failure about yourself  0 - - 0 - 0 1  Trouble concentrating 1 - - 0 - 0 1  Moving slowly or fidgety/restless 0 - - 0 - 0 0  Suicidal thoughts 0 - - 0 - 0 0  PHQ-9 Score 6 - - 5 - 3 15  Difficult doing work/chores Not difficult at all - - Somewhat difficult - - -  Some recent data might be hidden    Review of Systems  Musculoskeletal: Positive for arthralgias and back pain.  All other systems reviewed and are negative.      Objective:   Physical Exam Vitals and nursing note reviewed.  Constitutional:      Appearance: Normal appearance.  Cardiovascular:     Rate and Rhythm: Normal rate and regular rhythm.     Pulses: Normal pulses.     Heart sounds: Normal heart sounds.  Pulmonary:     Effort: Pulmonary effort is normal.     Breath sounds: Normal breath sounds.  Musculoskeletal:     Cervical back: Normal range of motion and  neck supple.     Comments: Normal Muscle Bulk and Muscle Testing Reveals:  Upper Extremities: Full ROM and Muscle Strength 5/5  Lumbar  Paraspinal Tenderness: L-3-L-5 Lower Extremities: Full ROM and Muscle Strength 5/5 Arises from Table with ease Narrow Based  Gait   Skin:    General: Skin is warm and dry.  Neurological:     Mental Status: She is alert and oriented to person, place, and time.  Psychiatric:        Mood and Affect: Mood normal.        Behavior: Behavior normal.           Assessment & Plan:  1.Chronic BilateralHip pain L>R---endstage OA of hips..Continue HEP as tolerated and Heat and Ice Therapy. Refilled:Hydrocodone 10/325 one q6 hours as needed for moderate pain #120. Second script e-scribed for the following month.09/01/2019 We will continue the opioid monitoring program, this consists of regular clinic visits, examinations, urine drug screen, pill counts as well as use of [place] Controlled Substance Reporting System. 2. Lumbar Radiculitis:Continue medication regimen withPamelor. Continue Cymbalta and Continue HEP as Tolerated. Continue to Monitor.09/01/2019.- 3. Morbid obesity: Continue with healthy diet Regime and HEP. Encouraged to continue with healthy diet regime andHEP.09/01/2019 4.Bilateral Greater Trochanteric Bursitis:.Continue with Heat and Ice Therapy. Continue to Monitor.09/01/2019. 5. Left Knee Pain:No complaints Today.Continue with HEP as Tolerated. Continue to Monitor.09/01/2019   30minutes of face to face patient care time was spent during this visit. All questions were encouraged and answered.  F/U in81months

## 2019-09-03 ENCOUNTER — Encounter: Payer: Self-pay | Admitting: Obstetrics & Gynecology

## 2019-09-03 ENCOUNTER — Other Ambulatory Visit: Payer: Self-pay

## 2019-09-03 ENCOUNTER — Ambulatory Visit (INDEPENDENT_AMBULATORY_CARE_PROVIDER_SITE_OTHER): Payer: BC Managed Care – PPO | Admitting: Obstetrics & Gynecology

## 2019-09-03 VITALS — BP 122/68 | HR 82 | Wt 264.0 lb

## 2019-09-03 DIAGNOSIS — L732 Hidradenitis suppurativa: Secondary | ICD-10-CM

## 2019-09-03 MED ORDER — NYSTATIN 100000 UNIT/GM EX POWD
1.0000 | Freq: Three times a day (TID) | CUTANEOUS | 1 refills | Status: DC
Start: 2019-09-03 — End: 2019-10-08

## 2019-09-03 MED ORDER — CLINDAMYCIN HCL 300 MG PO CAPS: 300.0000 mg | ORAL_CAPSULE | Freq: Three times a day (TID) | ORAL | 0 refills | Status: AC

## 2019-09-03 NOTE — Progress Notes (Signed)
Pt reports boils to groin area, had surgery for this, has had no issue since. Now experiencing same boils in groin area for the past 2 years. Up to date on mammogram and PAP smear. 122/68

## 2019-09-03 NOTE — Patient Instructions (Signed)
Hidradenitis Suppurativa Hidradenitis suppurativa is a long-term (chronic) skin disease. It is similar to a severe form of acne, but it affects areas of the body where acne would be unusual, especially areas of the body where skin rubs against skin and becomes moist. These include:  Underarms.  Groin.  Genital area.  Buttocks.  Upper thighs.  Breasts. Hidradenitis suppurativa may start out as small lumps or pimples caused by blocked sweat glands or hair follicles. Pimples may develop into deep sores that break open (rupture) and drain pus. Over time, affected areas of skin may thicken and become scarred. This condition is rare and does not spread from person to person (non-contagious). What are the causes? The exact cause of this condition is not known. It may be related to:  Female and female hormones.  An overactive disease-fighting system (immune system). The immune system may over-react to blocked hair follicles or sweat glands and cause swelling and pus-filled sores. What increases the risk? You are more likely to develop this condition if you:  Are female.  Are 11-55 years old.  Have a family history of hidradenitis suppurativa.  Have a personal history of acne.  Are overweight.  Smoke.  Take the medicine lithium. What are the signs or symptoms? The first symptoms are usually painful bumps in the skin, similar to pimples. The condition may get worse over time (progress), or it may only cause mild symptoms. If the disease progresses, symptoms may include:  Skin bumps getting bigger and growing deeper into the skin.  Bumps rupturing and draining pus.  Itchy, infected skin.  Skin getting thicker and scarred.  Tunnels under the skin (fistulas) where pus drains from a bump.  Pain during daily activities, such as pain during walking if your groin area is affected.  Emotional problems, such as stress or depression. This condition may affect your appearance and your  ability or willingness to wear certain clothes or do certain activities. How is this diagnosed? This condition is diagnosed by a health care provider who specializes in skin diseases (dermatologist). You may be diagnosed based on:  Your symptoms and medical history.  A physical exam.  Testing a pus sample for infection.  Blood tests. How is this treated? Your treatment will depend on how severe your symptoms are. The same treatment will not work for everybody with this condition. You may need to try several treatments to find what works best for you. Treatment may include:  Cleaning and bandaging (dressing) your wounds as needed.  Lifestyle changes, such as new skin care routines.  Taking medicines, such as: ? Antibiotics. ? Acne medicines. ? Medicines to reduce the activity of the immune system. ? A diabetes medicine (metformin). ? Birth control pills, for women. ? Steroids to reduce swelling and pain.  Working with a mental health care provider, if you experience emotional distress due to this condition. If you have severe symptoms that do not get better with medicine, you may need surgery. Surgery may involve:  Using a laser to clear the skin and remove hair follicles.  Opening and draining deep sores.  Removing the areas of skin that are diseased and scarred. Follow these instructions at home: Medicines   Take over-the-counter and prescription medicines only as told by your health care provider.  If you were prescribed an antibiotic medicine, take it as told by your health care provider. Do not stop taking the antibiotic even if your condition improves. Skin care  If you have open wounds, cover   them with a clean dressing as told by your health care provider. Keep wounds clean by washing them gently with soap and water when you bathe.  Do not shave the areas where you get hidradenitis suppurativa.  Do not wear deodorant.  Wear loose-fitting clothes.  Try to avoid  getting overheated or sweaty. If you get sweaty or wet, change into clean, dry clothes as soon as you can.  To help relieve pain and itchiness, cover sore areas with a warm, clean washcloth (warm compress) for 5-10 minutes as often as needed.  If told by your health care provider, take a bleach bath twice a week: ? Fill your bathtub halfway with water. ? Pour in  cup of unscented household bleach. ? Soak in the tub for 5-10 minutes. ? Only soak from the neck down. Avoid water on your face and hair. ? Shower to rinse off the bleach from your skin. General instructions  Learn as much as you can about your disease so that you have an active role in your treatment. Work closely with your health care provider to find treatments that work for you.  If you are overweight, work with your health care provider to lose weight as recommended.  Do not use any products that contain nicotine or tobacco, such as cigarettes and e-cigarettes. If you need help quitting, ask your health care provider.  If you struggle with living with this condition, talk with your health care provider or work with a mental health care provider as recommended.  Keep all follow-up visits as told by your health care provider. This is important. Where to find more information  Hidradenitis Suppurativa Foundation, Inc.: https://www.hs-foundation.org/ Contact a health care provider if you have:  A flare-up of hidradenitis suppurativa.  A fever or chills.  Trouble controlling your symptoms at home.  Trouble doing your daily activities because of your symptoms.  Trouble dealing with emotional problems related to your condition. Summary  Hidradenitis suppurativa is a long-term (chronic) skin disease. It is similar to a severe form of acne, but it affects areas of the body where acne would be unusual.  The first symptoms are usually painful bumps in the skin, similar to pimples. The condition may get worse over time  (progress), or it may only cause mild symptoms.  If you have open wounds, cover them with a clean dressing as told by your health care provider. Keep wounds clean by washing them gently with soap and water when you bathe.  Besides skin care, treatment may include medicines, laser treatment, and surgery. This information is not intended to replace advice given to you by your health care provider. Make sure you discuss any questions you have with your health care provider. Document Revised: 03/06/2017 Document Reviewed: 03/06/2017 Elsevier Patient Education  2020 [employer].  

## 2019-09-03 NOTE — Progress Notes (Signed)
[name] is an 59 y.o. female. W/ PMH of hidradenitis suppurativa now presents with boils noted in groin.  She has had intermittent recurrence over past two years.  Previously noted in axillae and were surgically removed.   Pertinent Gynecological History: Menses: post-menopausal Bleeding: none Contraception: post menopausal status DES exposure: denies Blood transfusions: none Sexually transmitted diseases: no past history Last mammogram: normal Date: 2020 Last pap: normal Date: 2020    Menstrual History: Menarche age: 79 Patient's last menstrual period was 02/09/2017.    Past Medical History:  Diagnosis Date  . Abscess    increased drainage from abscess on buttock  . Anal fistula   . Anxiety   . Bilateral hip pain 05/27/2015  . Chronic pain syndrome 05/27/2015  . Depression    sees [name]  . Diabetes mellitus without complication ([place])   . Hyperlipidemia   . Hypertension   . Sleep apnea    2008- sleep study, neg. for sleep apnea   . Stroke ([place])   . SVT (supraventricular tachycardia) ([place])   . Symptomatic cholelithiasis 09/11/2018    Past Surgical History:  Procedure Laterality Date  . ANAL EXAMINATION UNDER ANESTHESIA  02/21/11   anal fistula  . BREAST SURGERY  patient does not remember date of procedure   pull fluid off lft br  . CHOLECYSTECTOMY N/A 09/12/2018   Procedure: LAPAROSCOPIC CHOLECYSTECTOMY;  Surgeon: Stark Klein, MD;  Location: [facility];  Service: General;  Laterality: N/A;  . ELECTROPHYSIOLOGIC STUDY N/A 05/05/2015   Procedure: SVT Ablation;  Surgeon: Will Meredith Leeds, MD;  Location: [facility];  Service: Cardiovascular;  Laterality: N/A;  . EP IMPLANTABLE DEVICE N/A 01/30/2016   Procedure: Loop Recorder Insertion;  Surgeon: Sanda Klein, MD;  Location: [facility];  Service: Cardiovascular;  Laterality: N/A;  . INCISE AND DRAIN ABCESS     abscess on right thigh and buttock  . KNEE ARTHROSCOPY     left  . LAPAROSCOPIC  APPENDECTOMY N/A 09/19/2018   Procedure: APPENDECTOMY LAPAROSCOPIC;  Surgeon: Clovis Riley, MD;  Location: [facility];  Service: General;  Laterality: N/A;  . SHOULDER SURGERY  04/14/09   right    Family History  Problem Relation Age of Onset  . Hypertension Mother   . Alzheimer's disease Mother   . Diabetes Father   . Breast cancer Sister 81  . Anesthesia problems Neg Hx   . Hypotension Neg Hx   . Malignant hyperthermia Neg Hx   . Pseudochol deficiency Neg Hx   . Colon cancer Neg Hx   . Esophageal cancer Neg Hx   . Stomach cancer Neg Hx   . Rectal cancer Neg Hx   . Thyroid disease Neg Hx     Social History:  reports that she has never smoked. She has never used smokeless tobacco. She reports that she does not drink alcohol and does not use drugs.  Allergies:  Allergies  Allergen Reactions  . Ozempic (0.25 Or 0.5 Mg-Dose) [Semaglutide(0.25 Or 0.5mg -Dos)]     Severe nausea, bad gas  . Tramadol Nausea Only    (Not in a hospital admission)   Review of Systems  Constitutional: Negative for activity change, appetite change, fatigue and unexpected weight change.  HENT: Negative.   Eyes: Negative.   Respiratory: Negative.  Negative for chest tightness, shortness of breath and wheezing.   Cardiovascular: Negative.  Negative for chest pain and leg swelling.  Gastrointestinal: Negative.  Negative for abdominal distention, abdominal pain, constipation, diarrhea,  nausea and vomiting.  Endocrine: Negative.   Genitourinary: Positive for genital sores. Negative for dysuria and hematuria.  Neurological: Negative.  Negative for dizziness, weakness, light-headedness, numbness and headaches.  Hematological: Negative.   Psychiatric/Behavioral: Negative.  Negative for agitation, confusion and decreased concentration.    Blood pressure 122/68, pulse 82, weight 264 lb (119.7 kg), last menstrual period 02/09/2017. Physical Exam  Vitals reviewed. Constitutional: She is oriented to person,  place, and time. She appears well-developed.  HENT:  Head: Normocephalic and atraumatic.  Eyes: Pupils are equal, round, and reactive to light.  Cardiovascular: Normal rate.  Respiratory: Effort normal.  GI: Soft. She exhibits no distension. There is no abdominal tenderness. There is no guarding.  Genitourinary:    Vulva normal.     Genitourinary Comments: Multiple area of tender lesions, small boils mildly tender to palpation in bilateral groin.    Musculoskeletal:     Cervical back: Normal range of motion.  Neurological: She is alert and oriented to person, place, and time.  Skin: Skin is warm and dry.  Psychiatric: Her behavior is normal.    No results found for this or any previous visit (from the past 24 hour(s)).  No results found.  Assessment/Plan: Hidradenitis Suppurativa: reviewed prev abx, she did not receive first line abx, Rx 14 days of clindamycin. If no improvement will refer to derm for further management. Follow up in 2-3 months, sooner prn concerns.    Cherre Blanc 09/03/2019, 2:10 PM

## 2019-09-04 LAB — DRUG TOX MONITOR 1 W/CONF, ORAL FLD
Amphetamines: NEGATIVE ng/mL (ref <10)
Barbiturates: NEGATIVE ng/mL (ref <10)
Benzodiazepines: NEGATIVE ng/mL (ref <0.50)
Buprenorphine: NEGATIVE ng/mL (ref <0.10)
Cocaine: NEGATIVE ng/mL (ref <5.0)
Codeine: NEGATIVE ng/mL (ref <2.5)
Dihydrocodeine: 30.6 ng/mL — ABNORMAL HIGH (ref <2.5)
Fentanyl: NEGATIVE ng/mL (ref <0.10)
Heroin Metabolite: NEGATIVE ng/mL (ref <1.0)
Hydrocodone: 133.5 ng/mL — ABNORMAL HIGH (ref <2.5)
Hydromorphone: NEGATIVE ng/mL (ref <2.5)
MARIJUANA: NEGATIVE ng/mL (ref <2.5)
MDMA: NEGATIVE ng/mL (ref <10)
Meprobamate: NEGATIVE ng/mL (ref <2.5)
Methadone: NEGATIVE ng/mL (ref <5.0)
Morphine: NEGATIVE ng/mL (ref <2.5)
Nicotine Metabolite: NEGATIVE ng/mL (ref <5.0)
Norhydrocodone: 7.8 ng/mL — ABNORMAL HIGH (ref <2.5)
Noroxycodone: NEGATIVE ng/mL (ref <2.5)
Opiates: POSITIVE ng/mL — AB (ref <2.5)
Oxycodone: NEGATIVE ng/mL (ref <2.5)
Oxymorphone: NEGATIVE ng/mL (ref <2.5)
Phencyclidine: NEGATIVE ng/mL (ref <10)
Tapentadol: NEGATIVE ng/mL (ref <5.0)
Tramadol: NEGATIVE ng/mL (ref <5.0)
Zolpidem: NEGATIVE ng/mL (ref <5.0)

## 2019-09-04 LAB — DRUG TOX ALC METAB W/CON, ORAL FLD: Alcohol Metabolite: NEGATIVE ng/mL (ref <25)

## 2019-09-07 ENCOUNTER — Telehealth: Payer: Self-pay | Admitting: *Deleted

## 2019-09-07 NOTE — Telephone Encounter (Signed)
Oral swab drug screen was consistent for prescribed medications.  ?

## 2019-09-10 ENCOUNTER — Ambulatory Visit: Payer: BC Managed Care – PPO | Admitting: Occupational Therapy

## 2019-09-15 ENCOUNTER — Other Ambulatory Visit: Payer: Self-pay

## 2019-09-15 ENCOUNTER — Encounter: Payer: Self-pay | Admitting: Endocrinology

## 2019-09-15 DIAGNOSIS — IMO0002 Reserved for concepts with insufficient information to code with codable children: Secondary | ICD-10-CM

## 2019-09-15 MED ORDER — FREESTYLE LIBRE 14 DAY SENSOR MISC
1.0000 | 0 refills | Status: DC
Start: 2019-09-15 — End: 2019-10-08

## 2019-09-23 ENCOUNTER — Ambulatory Visit: Payer: BC Managed Care – PPO | Admitting: Endocrinology

## 2019-10-05 ENCOUNTER — Ambulatory Visit (INDEPENDENT_AMBULATORY_CARE_PROVIDER_SITE_OTHER): Payer: BC Managed Care – PPO | Admitting: *Deleted

## 2019-10-05 DIAGNOSIS — I6389 Other cerebral infarction: Secondary | ICD-10-CM

## 2019-10-06 LAB — CUP PACEART REMOTE DEVICE CHECK
Date Time Interrogation Session: 20210725231639
Implantable Pulse Generator Implant Date: 20171120

## 2019-10-08 ENCOUNTER — Telehealth: Payer: BC Managed Care – PPO | Admitting: Family Medicine

## 2019-10-08 ENCOUNTER — Encounter: Payer: Self-pay | Admitting: Family Medicine

## 2019-10-08 ENCOUNTER — Other Ambulatory Visit: Payer: Self-pay

## 2019-10-08 VITALS — BP 137/84 | HR 99 | Ht 63.0 in

## 2019-10-08 DIAGNOSIS — E1165 Type 2 diabetes mellitus with hyperglycemia: Secondary | ICD-10-CM

## 2019-10-08 DIAGNOSIS — E118 Type 2 diabetes mellitus with unspecified complications: Secondary | ICD-10-CM

## 2019-10-08 DIAGNOSIS — IMO0002 Reserved for concepts with insufficient information to code with codable children: Secondary | ICD-10-CM

## 2019-10-08 DIAGNOSIS — J0110 Acute frontal sinusitis, unspecified: Secondary | ICD-10-CM

## 2019-10-08 MED ORDER — AMOXICILLIN 500 MG PO TABS: 1000.0000 mg | ORAL_TABLET | Freq: Two times a day (BID) | ORAL | 0 refills | Status: DC

## 2019-10-08 NOTE — Progress Notes (Signed)
[facility] Summary Report / Loop Recorder 

## 2019-10-08 NOTE — Patient Instructions (Addendum)
Guaifenesin and dextromethorphan (ie Mucinex DM, or Tussin DM) can help with congestion and cough. Be sure to drink plenty of fluids. Sinus rinses with either Neti-pot or sinus rinse kit (once or twice daily) will help relieve sinus pain and pressure. Avoid using phenylephrine (decongestant) as this can raise your blood pressure.  I recommend getting COVID test (as vaccinated people's symptoms of COVID tend to be mild, and like a "cold").  That way you can know to advise contacts, and make sure that everyone isolates/quarantines appropriately if you test positive.  You had what sounds like a virus initially (COVID vs other common cold virus), that has now turned into a bacterial sinus infection and needs to be treated with antibiotics. Take amoxil 2 tablets twice daily for 10 days. If you aren't improving any by a week, please contact us. If you are better, but not 100% better by the last day of pills, please contact us so we can lengthen the course of antibiotics.

## 2019-10-08 NOTE — Progress Notes (Signed)
Start time:  4:00pm End time: 4:21 pm  Virtual Visit via Telephone Note  I connected with [name] on 10/08/19 at  3:30 PM EDT by telephone and verified that I am speaking with the correct person using two identifiers.  Location: Patient: home Provider: office   I discussed the limitations, risks, security and privacy concerns of performing an evaluation and management service by telephone and the availability of in person appointments. I also discussed with the patient that there may be a patient responsible charge related to this service. The patient expressed understanding and agreed to proceed.   History of Present Illness:  Chief Complaint  Patient presents with  . Facial Pain    PHONE CALL dark green/yellow mucus coming out of her nose and when she coughs it up. Started about 10 days ago. Took some OTC medication but never really got any better. Low grade fever.   2 Saturdays ago (12 days ago) she started with "nasty nose", congested chest.  She took [employer] sinus congestion and pain (see ingredients below), and [employer] severe congestion and sinus relief (see ingredients below).  It seemed to help, but then 2 days ago symptoms were worse again.  Drainage from the nose is yellow and green (it had cleared up some over the weekend).  She is having frontal headaches, especially on left. She is coughing up discolored phlegm. Denies chest pain or shortness of breath. Not very active since she had her stroke. She got a little winded while at the grocery store on Monday. She has had some low grade fevers.  She has had her COVID vaccines, no known sick contacts. She continues to wear her mask regularly.  She took the OTC meds above until last night.  Ingredients read from bottles are: Acetaminophen, guaifenesin, phenylephrine (severe congestion and sinus relief). Acetaminophen, phenylephrine (sinus congestion and pain).  Immunization History  Administered Date(s)  Administered  . Influenza,inj,Quad PF,6+ Mos 11/15/2017, 11/11/2018  . Influenza-Unspecified 12/24/2013, 12/26/2015, 11/02/2016, 11/11/2018  . PFIZER SARS-COV-2 Vaccination 05/07/2019, 06/03/2019  . Pneumococcal Polysaccharide-23 11/11/2018  . Tdap 08/03/2017  . Zoster Recombinat (Shingrix) 11/11/2018     PMH, PSH, SH reviewed: DM (on insulin), HTN, HLD, h/o CVA, GERD, anxiety DM not well controlled: Lab Results  Component Value Date   HGBA1C 10.6 (A) 08/11/2019     Outpatient Encounter Medications as of 10/08/2019  Medication Sig  . aspirin 81 MG EC tablet Take 1 tablet (81 mg total) by mouth daily.  Marland Kitchen atorvastatin (LIPITOR) 40 MG tablet Take 1 tablet (40 mg total) by mouth at bedtime.  . carvedilol (COREG) 6.25 MG tablet Take 1 tablet by mouth twice daily  . DEXILANT 60 MG capsule Take 1 capsule by mouth once daily  . diclofenac (VOLTAREN) 50 MG EC tablet Take 1 tablet by mouth twice daily  . DULoxetine (CYMBALTA) 60 MG capsule Take 1 capsule by mouth twice daily  . FARXIGA 5 MG TABS tablet Take 1 tablet (5 mg total) by mouth every morning.  . Ferrous Gluconate 324 (37.5 Fe) MG TABS TAKE 1 TABLET BY MOUTH TWICE DAILY WITH  A  MEAL  . FIASP FLEXTOUCH 100 UNIT/ML FlexTouch Pen SMARTSIG:30 Unit(s) SUB-Q 3 Times Daily  . furosemide (LASIX) 80 MG tablet Take 1 tablet by mouth once daily  . glucose blood test strip ONE TOUCH VERIO TEST STRIPS TO TEST 1-2 TIMES A DAY  . Insulin Glargine (BASAGLAR KWIKPEN) 100 UNIT/ML Inject 0.9 mLs (90 Units total) into the skin every morning.  And pen needles 1/day  . nortriptyline (PAMELOR) 25 MG capsule Take 1 capsule (25 mg total) by mouth at bedtime.  . potassium chloride (KLOR-CON) 10 MEQ tablet Take 1 tablet (10 mEq total) by mouth daily.  . [DISCONTINUED] Continuous Blood Gluc Sensor (FREESTYLE LIBRE 14 DAY SENSOR) MISC 1 each by Does not apply route every 14 (fourteen) days. PT REQUESTED RX BUT DOES NOT QUALIFY. WILL NOT COMPLETE PA. SELF PAY  .  [DISCONTINUED] insulin aspart (NOVOLOG FLEXPEN) 100 UNIT/ML FlexPen Inject 30 Units into the skin 3 (three) times daily with meals.  Marland Kitchen HYDROcodone-acetaminophen (NORCO) 10-325 MG tablet Take 1 tablet by mouth every 6 (six) hours as needed. Please do not  Fill before 09/29/2019 (Patient not taking: Reported on 10/08/2019)  . [DISCONTINUED] nystatin (MYCOSTATIN/NYSTOP) powder Apply 1 application topically 3 (three) times daily.   No facility-administered encounter medications on file as of 10/08/2019.   Allergies  Allergen Reactions  . Ozempic (0.25 Or 0.5 Mg-Dose) [Semaglutide(0.25 Or 0.2m-Dos)]     Severe nausea, bad gas  . Tramadol Nausea Only   ROS: Has had lowgrade fever (99), no chills. No nausea, vomiting, diarrhea. No skin rashes, bleeding, bruising. +sinus headache.  Denies dizziness, chest pain.  Observations/Objective:  BP (!) 137/84   Pulse 99   Ht 5' 3"  (1.6 m)   LMP 02/09/2017   BMI 46.77 kg/m   Alert, oriented, pleasant female, in good spirits. She doesn't sound congested. She is speaking easily in full sentences, with no coughing during the visit. Exam is limited due to virtual nature of the visit.   Assessment and Plan:  Acute non-recurrent frontal sinusitis - Viral URI (COVID vs other virus), which now progressed to sinusitis. Diabetic (with high A1c).Treat with Amoxil--contact [place] not improving within a week. - Plan: amoxicillin (AMOXIL) 500 MG tablet  Uncontrolled diabetes mellitus with complications (HCC) - at higher risk for bacterial complication of URI, which appears to be what has occured.  Rx Amoxil for symptoms   Guaifenesin and dextromethorphan (ie Mucinex DM, Tussin DM) can help with congestion and cough. Be sure to drink plenty of fluids. Sinus rinses with either Neti-pot or sinus rinse kit (once or twice daily) will help relieve sinus pain and pressure. Avoid using phenylephrine (decongestant) as this can raise your blood pressure.  I recommend  getting COVID test (as vaccinated people's symptoms of COVID tend to be mild, and like a "cold").  You had what sounds like a virus, that has now turned into a bacterial sinus infection and needs to be treated with antibiotics. Take amoxil 2 tablets twice daily for 10 days. If you aren't improving by a week, please contact [place] If you are better, but not 100% better by the last day of pills, please contact [place] we can lengthen the course of antibiotics.   Follow Up Instructions:    I discussed the assessment and treatment plan with the patient. The patient was provided an opportunity to ask questions and all were answered. The patient agreed with the plan and demonstrated an understanding of the instructions.   The patient was advised to call back or seek an in-person evaluation if the symptoms worsen or if the condition fails to improve as anticipated.  I provided 21 minutes of direct conversation time (phone)  during this encounter. Additional time spent in chart review and documentation.   EVikki Ports MD

## 2019-10-18 ENCOUNTER — Other Ambulatory Visit: Payer: Self-pay | Admitting: Medical

## 2019-10-18 DIAGNOSIS — I471 Supraventricular tachycardia: Secondary | ICD-10-CM

## 2019-10-18 DIAGNOSIS — R609 Edema, unspecified: Secondary | ICD-10-CM

## 2019-10-19 ENCOUNTER — Ambulatory Visit: Payer: BC Managed Care – PPO | Admitting: Endocrinology

## 2019-10-22 ENCOUNTER — Encounter: Payer: Self-pay | Admitting: Registered Nurse

## 2019-10-22 ENCOUNTER — Other Ambulatory Visit: Payer: Self-pay

## 2019-10-22 ENCOUNTER — Encounter: Payer: BC Managed Care – PPO | Attending: Physical Medicine & Rehabilitation | Admitting: Registered Nurse

## 2019-10-22 VITALS — BP 150/72 | HR 84 | Temp 98.6°F | Ht 63.0 in | Wt 269.0 lb

## 2019-10-22 DIAGNOSIS — M25551 Pain in right hip: Secondary | ICD-10-CM | POA: Insufficient documentation

## 2019-10-22 DIAGNOSIS — Z5181 Encounter for therapeutic drug level monitoring: Secondary | ICD-10-CM | POA: Diagnosis not present

## 2019-10-22 DIAGNOSIS — Z833 Family history of diabetes mellitus: Secondary | ICD-10-CM | POA: Insufficient documentation

## 2019-10-22 DIAGNOSIS — G4730 Sleep apnea, unspecified: Secondary | ICD-10-CM | POA: Insufficient documentation

## 2019-10-22 DIAGNOSIS — G894 Chronic pain syndrome: Secondary | ICD-10-CM | POA: Insufficient documentation

## 2019-10-22 DIAGNOSIS — Z809 Family history of malignant neoplasm, unspecified: Secondary | ICD-10-CM | POA: Diagnosis not present

## 2019-10-22 DIAGNOSIS — Z8249 Family history of ischemic heart disease and other diseases of the circulatory system: Secondary | ICD-10-CM | POA: Diagnosis not present

## 2019-10-22 DIAGNOSIS — I10 Essential (primary) hypertension: Secondary | ICD-10-CM | POA: Diagnosis not present

## 2019-10-22 DIAGNOSIS — M62830 Muscle spasm of back: Secondary | ICD-10-CM

## 2019-10-22 DIAGNOSIS — F418 Other specified anxiety disorders: Secondary | ICD-10-CM | POA: Diagnosis not present

## 2019-10-22 DIAGNOSIS — G8929 Other chronic pain: Secondary | ICD-10-CM | POA: Diagnosis present

## 2019-10-22 DIAGNOSIS — E119 Type 2 diabetes mellitus without complications: Secondary | ICD-10-CM | POA: Diagnosis not present

## 2019-10-22 DIAGNOSIS — M25552 Pain in left hip: Secondary | ICD-10-CM | POA: Insufficient documentation

## 2019-10-22 DIAGNOSIS — Z79891 Long term (current) use of opiate analgesic: Secondary | ICD-10-CM | POA: Insufficient documentation

## 2019-10-22 DIAGNOSIS — M79641 Pain in right hand: Secondary | ICD-10-CM | POA: Insufficient documentation

## 2019-10-22 DIAGNOSIS — M5416 Radiculopathy, lumbar region: Secondary | ICD-10-CM

## 2019-10-22 DIAGNOSIS — M7062 Trochanteric bursitis, left hip: Secondary | ICD-10-CM

## 2019-10-22 DIAGNOSIS — R531 Weakness: Secondary | ICD-10-CM

## 2019-10-22 DIAGNOSIS — I471 Supraventricular tachycardia: Secondary | ICD-10-CM | POA: Diagnosis not present

## 2019-10-22 MED ORDER — HYDROCODONE-ACETAMINOPHEN 10-325 MG PO TABS: 1.0000 | ORAL_TABLET | Freq: Four times a day (QID) | ORAL | 0 refills | Status: DC | PRN

## 2019-10-22 MED ORDER — METHOCARBAMOL 500 MG PO TABS
500.0000 mg | ORAL_TABLET | Freq: Two times a day (BID) | ORAL | 2 refills | Status: DC | PRN
Start: 2019-10-22 — End: 2020-03-24

## 2019-10-22 NOTE — Progress Notes (Signed)
Subjective:    Patient ID: [name], female    DOB: 1960-05-01, 59 y.o.   MRN: [id number]  HPI: [name] is a 59 y.o. female who returns for follow up appointment for chronic pain and medication refill. She states her pain is located in her lower back pain radiating into her left hip and left lower extremity. Also reports increase frequency of  muscle spasm in her back, with no relief with heat and ice packs, medication list reviewed, we will prescribe methocarbamol. She was prescribe methocarbamol in the past without adverse reaction she reports. She rates her pain 6. Her current exercise regime is walking and performing stretching exercises.  Ms. Mathenia Morphine equivalent is 40.00 MME.    Last Oral Swab was Performed on 09/01/2019, it was consistent.    Pain Inventory Average Pain 7 Pain Right Now 6 My pain is sharp, dull and stabbing  In the last 24 hours, has pain interfered with the following? General activity 10 Relation with others 10 Enjoyment of life 10 What TIME of day is your pain at its worst? daytime and evening Sleep (in general) Fair  Pain is worse with: walking and some activites Pain improves with: rest and medication Relief from Meds: 5  Family History  Problem Relation Age of Onset  . Hypertension Mother   . Alzheimer's disease Mother   . Diabetes Father   . Breast cancer Sister 62  . Anesthesia problems Neg Hx   . Hypotension Neg Hx   . Malignant hyperthermia Neg Hx   . Pseudochol deficiency Neg Hx   . Colon cancer Neg Hx   . Esophageal cancer Neg Hx   . Stomach cancer Neg Hx   . Rectal cancer Neg Hx   . Thyroid disease Neg Hx    Social History   Socioeconomic History  . Marital status: Divorced    Spouse name: Not on file  . Number of children: 0  . Years of education: Not on file  . Highest education level: Not on file  Occupational History  . Occupation: TEACHER ASSISTANT  Tobacco Use  . Smoking status: Never  Smoker  . Smokeless tobacco: Never Used  Vaping Use  . Vaping Use: Never used  Substance and Sexual Activity  . Alcohol use: No  . Drug use: No  . Sexual activity: Yes  Other Topics Concern  . Not on file  Social History Narrative  . Not on file   Social Determinants of Health   Financial Resource Strain:   . Difficulty of Paying Living Expenses:   Food Insecurity: Food Insecurity Present  . Worried About Charity fundraiser in the Last Year: Sometimes true  . Ran Out of Food in the Last Year: Sometimes true  Transportation Needs: No Transportation Needs  . Lack of Transportation (Medical): No  . Lack of Transportation (Non-Medical): No  Physical Activity:   . Days of Exercise per Week:   . Minutes of Exercise per Session:   Stress:   . Feeling of Stress :   Social Connections:   . Frequency of Communication with Friends and Family:   . Frequency of Social Gatherings with Friends and Family:   . Attends Religious Services:   . Active Member of Clubs or Organizations:   . Attends Archivist Meetings:   Marland Kitchen Marital Status:    Past Surgical History:  Procedure Laterality Date  . ANAL EXAMINATION UNDER ANESTHESIA  02/21/11   anal fistula  . BREAST  SURGERY  patient does not remember date of procedure   pull fluid off lft br  . CHOLECYSTECTOMY N/A 09/12/2018   Procedure: LAPAROSCOPIC CHOLECYSTECTOMY;  Surgeon: Stark Klein, MD;  Location: [facility];  Service: General;  Laterality: N/A;  . ELECTROPHYSIOLOGIC STUDY N/A 05/05/2015   Procedure: SVT Ablation;  Surgeon: Will Meredith Leeds, MD;  Location: [facility];  Service: Cardiovascular;  Laterality: N/A;  . EP IMPLANTABLE DEVICE N/A 01/30/2016   Procedure: Loop Recorder Insertion;  Surgeon: Sanda Klein, MD;  Location: [facility];  Service: Cardiovascular;  Laterality: N/A;  . INCISE AND DRAIN ABCESS     abscess on right thigh and buttock  . KNEE ARTHROSCOPY     left  . LAPAROSCOPIC APPENDECTOMY N/A  09/19/2018   Procedure: APPENDECTOMY LAPAROSCOPIC;  Surgeon: Clovis Riley, MD;  Location: [facility];  Service: General;  Laterality: N/A;  . SHOULDER SURGERY  04/14/09   right   Past Surgical History:  Procedure Laterality Date  . ANAL EXAMINATION UNDER ANESTHESIA  02/21/11   anal fistula  . BREAST SURGERY  patient does not remember date of procedure   pull fluid off lft br  . CHOLECYSTECTOMY N/A 09/12/2018   Procedure: LAPAROSCOPIC CHOLECYSTECTOMY;  Surgeon: Stark Klein, MD;  Location: [facility];  Service: General;  Laterality: N/A;  . ELECTROPHYSIOLOGIC STUDY N/A 05/05/2015   Procedure: SVT Ablation;  Surgeon: Will Meredith Leeds, MD;  Location: [facility];  Service: Cardiovascular;  Laterality: N/A;  . EP IMPLANTABLE DEVICE N/A 01/30/2016   Procedure: Loop Recorder Insertion;  Surgeon: Sanda Klein, MD;  Location: [facility];  Service: Cardiovascular;  Laterality: N/A;  . INCISE AND DRAIN ABCESS     abscess on right thigh and buttock  . KNEE ARTHROSCOPY     left  . LAPAROSCOPIC APPENDECTOMY N/A 09/19/2018   Procedure: APPENDECTOMY LAPAROSCOPIC;  Surgeon: Clovis Riley, MD;  Location: [facility];  Service: General;  Laterality: N/A;  . SHOULDER SURGERY  04/14/09   right   Past Medical History:  Diagnosis Date  . Abscess    increased drainage from abscess on buttock  . Anal fistula   . Anxiety   . Bilateral hip pain 05/27/2015  . Chronic pain syndrome 05/27/2015  . Depression    sees [name]  . Diabetes mellitus without complication ([place])   . Hyperlipidemia   . Hypertension   . Sleep apnea    2008- sleep study, neg. for sleep apnea   . Stroke ([place])   . SVT (supraventricular tachycardia) ([place])   . Symptomatic cholelithiasis 09/11/2018   BP (!) 150/72   Pulse 84   Temp 98.6 F (37 C)   Ht 5\' 3"  (1.6 m)   Wt 269 lb (122 kg)   LMP 02/09/2017   SpO2 96%   BMI 47.65 kg/m   Opioid Risk Score:   Fall Risk Score:  `1  Depression screen PHQ 2/9  Depression  screen [facility] 2/9 09/03/2019 07/06/2019 06/30/2019 10/28/2017 10/02/2017 07/15/2017 05/23/2017  Decreased Interest 0 1 0 0 0 0 1  Down, Depressed, Hopeless 0 1 0 0 3 0 1  PHQ - 2 Score 0 2 0 0 3 0 2  Altered sleeping 1 1 - - 1 - 0  Tired, decreased energy 1 2 - - 0 - 1  Change in appetite 0 0 - - 1 - 0  Feeling bad or failure about yourself  0 0 - - 0 - 0  Trouble  concentrating 0 1 - - 0 - 0  Moving slowly or fidgety/restless 0 0 - - 0 - 0  Suicidal thoughts 0 0 - - 0 - 0  PHQ-9 Score 2 6 - - 5 - 3  Difficult doing work/chores - Not difficult at all - - Somewhat difficult - -  Some recent data might be hidden   Review of Systems  Constitutional: Negative.   HENT: Negative.   Eyes: Negative.   Respiratory: Negative.   Cardiovascular: Negative.   Gastrointestinal: Negative.   Endocrine: Negative.   Genitourinary: Negative.   Musculoskeletal: Positive for arthralgias and back pain.       Spasms   Skin: Negative.   Allergic/Immunologic: Negative.   Neurological: Negative.   Psychiatric/Behavioral: Negative.   All other systems reviewed and are negative.      Objective:   Physical Exam Vitals and nursing note reviewed.  Constitutional:      Appearance: Normal appearance.  Cardiovascular:     Rate and Rhythm: Normal rate and regular rhythm.     Pulses: Normal pulses.     Heart sounds: Normal heart sounds.  Pulmonary:     Effort: Pulmonary effort is normal.     Breath sounds: Normal breath sounds.  Musculoskeletal:     Cervical back: Normal range of motion and neck supple.     Comments: Normal Muscle Bulk and Muscle Testing Reveals:  Upper Extremities: Full ROM and Muscle Strength on Right 4/5 and Left 5/5 Lumbar Paraspinal Tenderness: L-4-L-5 Lower Extremities: Full ROM and Muscle Strength 5/5 Arises from Table Slowly Narrow Based Gait   Skin:    General: Skin is warm and dry.  Neurological:     Mental Status: She is alert and oriented to person, place, and time.    Psychiatric:        Mood and Affect: Mood normal.        Behavior: Behavior normal.           Assessment & Plan:  1.Chronic BilateralHip pain L>R---endstage OA of hips..Continue HEP as tolerated and Heat and Ice Therapy. Refilled:Hydrocodone 10/325 one q6 hours as needed for moderate pain #120. Second script e-scribed for the following month.10/22/2019 We will continue the opioid monitoring program, this consists of regular clinic visits, examinations, urine drug screen, pill counts as well as use of [place] Controlled Substance Reporting System. 2. Lumbar Radiculitis:Continue medication regimen withPamelor. Continue Cymbalta and Continue HEP as Tolerated. Continue to Monitor.10/22/2019.- 3. Morbid obesity: Continue with healthy diet Regime and HEP. Encouraged to continue with healthy diet regime andHEP.10/22/2019 4.Bilateral Greater Trochanteric Bursitis:.Continue with Heat and Ice Therapy. Continue to Monitor.10/22/2019. 5. Left Knee Pain:No complaintsToday.Continue with HEP as Tolerated. Continue to Monitor.10/22/2019 6. Muscle Spasm: RX: Methocarbamol. Continue to monitor.   19minutes of face to face patient care time was spent during this visit. All questions were encouraged and answered.  F/U [id number]

## 2019-10-27 ENCOUNTER — Ambulatory Visit: Payer: BC Managed Care – PPO | Admitting: Endocrinology

## 2019-10-28 ENCOUNTER — Ambulatory Visit: Payer: BC Managed Care – PPO | Admitting: Obstetrics and Gynecology

## 2019-11-02 ENCOUNTER — Telehealth: Payer: Self-pay | Admitting: Emergency Medicine

## 2019-11-02 NOTE — Telephone Encounter (Addendum)
Left detailed message per DPR. Requested call back at device cliic #. LINQ at RRT and need to discuss option to extract device or leave it in place. Remote appointments cancelled and patient message included instructions to unplug remote monitor.

## 2019-11-04 ENCOUNTER — Ambulatory Visit: Payer: BC Managed Care – PPO | Admitting: Adult Health

## 2019-11-05 NOTE — Telephone Encounter (Signed)
Patient returning call.

## 2019-11-05 NOTE — Telephone Encounter (Signed)
Advised patient the ILR has reached RRT. Discussed options with patient in regards to leave device in or have It removed. Patient would like to have device removed. Advised patient scheduler will contact her to set up apt. Return kit has been ordered and will be send to home address. Address verified with patient. Advised patient to call office if we can assist her. Verbalizes understanding.

## 2019-11-05 NOTE — Telephone Encounter (Signed)
2nd attempt to reach patient regarding ILR at RRT.  No answer. LMOVM.

## 2019-11-08 ENCOUNTER — Other Ambulatory Visit: Payer: Self-pay

## 2019-11-09 MED ORDER — FARXIGA 5 MG PO TABS: 5.0000 mg | ORAL_TABLET | Freq: Every morning | ORAL | 0 refills | Status: DC

## 2019-11-13 NOTE — Telephone Encounter (Signed)
Left a message for the patient to call back.  

## 2019-11-24 ENCOUNTER — Telehealth: Payer: Self-pay | Admitting: Cardiovascular Disease

## 2019-11-24 NOTE — Telephone Encounter (Signed)
Spoke with the patient. Loop recorder explant is scheduled for 9/27 at 3pm with Dr. Sallyanne Kuster in office.     [facility], [address]  [place], [place] [address]  Phone: [phone] Fax: [phone]   You are scheduled for a Loop Recorder explant on 12/07/19 at 3 pm with Dr. Sallyanne Kuster. This will take place at [place], [address].    Please arrive at your appointment 15-20 minutes early.    You do not need to be fasting.    The procedure is performed with local anesthesia. You will not receive sedatives nor will an IV be placed.    Wash your chest and neck with the surgical soap the evening before and the morning of your procedure. Please following the washing instructions provided.    As with all surgical implants, there is a small risk of infection. If an infection occurs, the device will be removed. To help reduce the risk, please use the surgical scrub provided. Additional antiseptic precautions will be taken at the time of the procedure.    Please bring your insurance cards.   *Please note that scheduled loop recorder implants may need to be rescheduled if Dr. Sallyanne Kuster has a procedure urgently added on at the hospital   Preparing for the Procedure  Before the procedure, you can play an important role. Because skin is not sterile, your skin needs to be as free of germs as possible. You can reduce the number of germs on your skin by washing with CHG (chlorhexidine gluconate) Soap before the procedure. CHG is an antiseptic cleaner which kills germs and bonds with the skin to continue killing germs even after washing.  Please do not use if you have an allergy to CHG or antibacterial soaps. If your skin becomes reddened/irritated, STOP using the CHG.  DO NOT SHAVE (including legs and underarms) for at least 48 hours prior to first CHG shower. It is OK to shave your face.  Please follow these instructions  carefully: 1. Shower the night before the procedure and the morning of with CHG Soap. 2. If you chose to wash your hair, wash your hair first as usual with your normal shampoo/conditioner. 3. After you shampoo/condition, rinse you hair and body thoroughly to remove shampoo/conditioner. 4. Use CHG as you would any other liquid soap. You can apply CHG directly to the skin and wash gently with a loofah or a clean washcloth. 5. Apply the CHG Soap to your body ONLY FROM THE NECK DOWN. Do not use on open wounds or open sores. Avoid contact with your eyes, ears, mouth, and genitals (private parts).  6. Wash thoroughly, paying special attention to the area where your surgery will be performed. 7. Thoroughly rinse your body with warm water from the neck down. 8. DO NOT shower/wash with your normal soap after using and rinsing off the CHG Soap. 9. Pat yourself dry with a clean towel. 10. Wear clean pajamas to bed. 11. Place clean sheets on your bed the night of your first shower and do not sleep with pets..  Day of Surgery: Shower with the CHG Soap following the instructions listed above. DO NOT apply deodorants or lotions.

## 2019-11-24 NOTE — Telephone Encounter (Signed)
Left a message for the patient to call back.  

## 2019-11-24 NOTE — Telephone Encounter (Signed)
Patient returned Lisa's called, transferred call to nurse.

## 2019-11-25 ENCOUNTER — Ambulatory Visit: Payer: BC Managed Care – PPO | Admitting: Endocrinology

## 2019-12-03 ENCOUNTER — Telehealth: Payer: Self-pay | Admitting: *Deleted

## 2019-12-03 NOTE — Telephone Encounter (Signed)
Prior authorization for methocarbamol APPROVED.  10/29/2019 - 10/28/2020

## 2019-12-04 ENCOUNTER — Telehealth: Payer: Self-pay | Admitting: *Deleted

## 2019-12-04 NOTE — Telephone Encounter (Signed)
Left a message for the patient to call back to see if she can come in at 4 pm on Monday 9/27 for her Loop Explant.

## 2019-12-04 NOTE — Telephone Encounter (Signed)
The patient will arrive at 3:45 pm for the loop explant.

## 2019-12-07 ENCOUNTER — Other Ambulatory Visit: Payer: Self-pay

## 2019-12-07 ENCOUNTER — Ambulatory Visit (INDEPENDENT_AMBULATORY_CARE_PROVIDER_SITE_OTHER): Payer: BC Managed Care – PPO | Admitting: Cardiovascular Disease

## 2019-12-07 DIAGNOSIS — Z4509 Encounter for adjustment and management of other cardiac device: Secondary | ICD-10-CM

## 2019-12-07 DIAGNOSIS — Z95818 Presence of other cardiac implants and grafts: Secondary | ICD-10-CM

## 2019-12-07 MED ORDER — LIDOCAINE-EPINEPHRINE 1 %-1:100000 IJ SOLN: 20.0000 mL | Freq: Once | INTRAMUSCULAR | Status: DC

## 2019-12-07 NOTE — Patient Instructions (Signed)
Discharge Instructions for  Loop Recorder Explant   Follow up: Keep your wound check appointment on 12/17/19 at 3 pm..  If you have any questions or concerns, please call the office at [phone].  ACTIVITY No restrictions. DO wear your seatbelt, even if it crosses over the site.   WOUND CARE  Keep the wound area clean and dry.  Remove the dressing the day after (usually 24 hours after the procedure).  DO NOT SUBMERGE UNDER WATER UNTIL FULLY HEALED (no tub baths, hot tubs, swimming pools, etc.).   You  may shower or take a sponge bath after the dressing is removed. DO NOT SOAK the area and do not allow the shower to directly spray on the site.  If you have tape/steri-strips on your wound, these will fall off; do not pull them off prematurely.    No bandage is needed on the site.  DO  NOT apply any creams, oils, or ointments to the wound area.  If you notice any drainage or discharge from the wound, any swelling, excessive redness or bruising at the site, or if you develop a fever > 101? F, call the office at once at [phone].

## 2019-12-07 NOTE — Procedures (Signed)
Reason for procedure:  1. Device at end of battery life Procedure performed by:  Sanda Klein, MD  Complications:  None  Estimated blood loss:  <5 mL  Medications administered during procedure:  Lidocaine 2% with 1/100,000 epinephrine 20 mL locally Device details:  Medtronic Reveal Linq model number [id number] Procedure details:  After the risks and benefits of the procedure were discussed the patient provided informed consent. The patient was prepped and draped in usual sterile fashion. Local anesthesia was administered to an area 2 cm to the left of the sternum in the 4th intercostal space. A 1.5 cm cutaneous incision was made. The device was easily located and extracted with a hemostat. Hemostasis was achieved with local pressure.  The incision was closed with SteriStrips and a sterile dressing was applied.   Sanda Klein, MD, [facility] [phone] office [phone] pager 12/07/2019 4:48 PM

## 2019-12-09 ENCOUNTER — Other Ambulatory Visit: Payer: Self-pay | Admitting: Medical

## 2019-12-17 ENCOUNTER — Ambulatory Visit: Payer: BC Managed Care – PPO

## 2019-12-28 ENCOUNTER — Encounter: Payer: BC Managed Care – PPO | Attending: Physical Medicine & Rehabilitation | Admitting: Registered Nurse

## 2019-12-28 ENCOUNTER — Other Ambulatory Visit: Payer: Self-pay

## 2019-12-28 ENCOUNTER — Encounter: Payer: Self-pay | Admitting: Registered Nurse

## 2019-12-28 VITALS — HR 81 | Temp 98.6°F | Ht 63.0 in | Wt 268.0 lb

## 2019-12-28 DIAGNOSIS — R531 Weakness: Secondary | ICD-10-CM | POA: Diagnosis present

## 2019-12-28 DIAGNOSIS — Z79891 Long term (current) use of opiate analgesic: Secondary | ICD-10-CM

## 2019-12-28 DIAGNOSIS — G894 Chronic pain syndrome: Secondary | ICD-10-CM | POA: Insufficient documentation

## 2019-12-28 DIAGNOSIS — Z5181 Encounter for therapeutic drug level monitoring: Secondary | ICD-10-CM | POA: Insufficient documentation

## 2019-12-28 DIAGNOSIS — M7062 Trochanteric bursitis, left hip: Secondary | ICD-10-CM | POA: Diagnosis present

## 2019-12-28 DIAGNOSIS — M5416 Radiculopathy, lumbar region: Secondary | ICD-10-CM | POA: Diagnosis not present

## 2019-12-28 MED ORDER — HYDROCODONE-ACETAMINOPHEN 10-325 MG PO TABS: 1.0000 | ORAL_TABLET | Freq: Four times a day (QID) | ORAL | 0 refills | Status: DC | PRN

## 2019-12-28 MED ORDER — HYDROCODONE-ACETAMINOPHEN 10-325 MG PO TABS
1.0000 | ORAL_TABLET | Freq: Four times a day (QID) | ORAL | 0 refills | Status: DC | PRN
Start: 2019-12-28 — End: 2020-02-25

## 2019-12-28 NOTE — Progress Notes (Signed)
Subjective:    Patient ID: [name], female    DOB: 1960-09-30, 59 y.o.   MRN: [id number]  HPI: [name] is a 59 y.o. female who returns for follow up appointment for chronic pain and medication refill. She states her pain is located in her lower back pain radiating into her left hip pain. She rates her pain 8. Her current exercise regime is walking and performing stretching exercises.  [name] equivalent is 40.00 MME.    Last Oral Swab was Performed on 09/01/2019, it was consistent.   [name] blood pressure 105/61  Pain Inventory Average Pain 7 Pain Right Now 8 My pain is sharp, dull and stabbing  In the last 24 hours, has pain interfered with the following? General activity 8 Relation with others 8 Enjoyment of life 10 What TIME of day is your pain at its worst? daytime and night Sleep (in general) Fair  Pain is worse with: walking, bending and some activites Pain improves with: rest, therapy/exercise and medication Relief from Meds: 7  Family History  Problem Relation Age of Onset  . Hypertension Mother   . Alzheimer's disease Mother   . Diabetes Father   . Breast cancer Sister 76  . Anesthesia problems Neg Hx   . Hypotension Neg Hx   . Malignant hyperthermia Neg Hx   . Pseudochol deficiency Neg Hx   . Colon cancer Neg Hx   . Esophageal cancer Neg Hx   . Stomach cancer Neg Hx   . Rectal cancer Neg Hx   . Thyroid disease Neg Hx    Social History   Socioeconomic History  . Marital status: Divorced    Spouse name: Not on Theresa  . Number of children: 0  . Years of education: Not on Theresa  . Highest education level: Not on Theresa  Occupational History  . Occupation: TEACHER ASSISTANT  Tobacco Use  . Smoking status: Never Smoker  . Smokeless tobacco: Never Used  Vaping Use  . Vaping Use: Never used  Substance and Sexual Activity  . Alcohol use: No  . Drug use: No  . Sexual activity: Yes  Other Topics Concern  . Not  on Theresa  Social History Narrative  . Not on Theresa   Social Determinants of Health   Financial Resource Strain:   . Difficulty of Paying Living Expenses: Not on Theresa  Food Insecurity: Food Insecurity Present  . Worried About Charity fundraiser in the Last Year: Sometimes true  . Ran Out of Food in the Last Year: Sometimes true  Transportation Needs: No Transportation Needs  . Lack of Transportation (Medical): No  . Lack of Transportation (Non-Medical): No  Physical Activity:   . Days of Exercise per Week: Not on Theresa  . Minutes of Exercise per Session: Not on Theresa  Stress:   . Feeling of Stress : Not on Theresa  Social Connections:   . Frequency of Communication with Friends and Family: Not on Theresa  . Frequency of Social Gatherings with Friends and Family: Not on Theresa  . Attends Religious Services: Not on Theresa  . Active Member of Clubs or Organizations: Not on Theresa  . Attends Archivist Meetings: Not on Theresa  . Marital Status: Not on Theresa   Past Surgical History:  Procedure Laterality Date  . ANAL EXAMINATION UNDER ANESTHESIA  02/21/11   anal fistula  . BREAST SURGERY  patient does not remember date of procedure   pull fluid off  lft br  . CHOLECYSTECTOMY N/A 09/12/2018   Procedure: LAPAROSCOPIC CHOLECYSTECTOMY;  Surgeon: Stark Klein, MD;  Location: [place];  Service: General;  Laterality: N/A;  . ELECTROPHYSIOLOGIC STUDY N/A 05/05/2015   Procedure: SVT Ablation;  Surgeon: Will Meredith Leeds, MD;  Location: [facility];  Service: Cardiovascular;  Laterality: N/A;  . EP IMPLANTABLE DEVICE N/A 01/30/2016   Procedure: Loop Recorder Insertion;  Surgeon: Sanda Klein, MD;  Location: [facility];  Service: Cardiovascular;  Laterality: N/A;  . INCISE AND DRAIN ABCESS     abscess on right thigh and buttock  . KNEE ARTHROSCOPY     left  . LAPAROSCOPIC APPENDECTOMY N/A 09/19/2018   Procedure: APPENDECTOMY LAPAROSCOPIC;  Surgeon: Clovis Riley, MD;  Location: [facility];  Service: General;  Laterality: N/A;  . SHOULDER SURGERY  04/14/09   right   Past Surgical History:  Procedure Laterality Date  . ANAL EXAMINATION UNDER ANESTHESIA  02/21/11   anal fistula  . BREAST SURGERY  patient does not remember date of procedure   pull fluid off lft br  . CHOLECYSTECTOMY N/A 09/12/2018   Procedure: LAPAROSCOPIC CHOLECYSTECTOMY;  Surgeon: Stark Klein, MD;  Location: [facility];  Service: General;  Laterality: N/A;  . ELECTROPHYSIOLOGIC STUDY N/A 05/05/2015   Procedure: SVT Ablation;  Surgeon: Will Meredith Leeds, MD;  Location: [facility];  Service: Cardiovascular;  Laterality: N/A;  . EP IMPLANTABLE DEVICE N/A 01/30/2016   Procedure: Loop Recorder Insertion;  Surgeon: Sanda Klein, MD;  Location: [facility];  Service: Cardiovascular;  Laterality: N/A;  . INCISE AND DRAIN ABCESS     abscess on right thigh and buttock  . KNEE ARTHROSCOPY     left  . LAPAROSCOPIC APPENDECTOMY N/A 09/19/2018   Procedure: APPENDECTOMY LAPAROSCOPIC;  Surgeon: Clovis Riley, MD;  Location: [facility];  Service: General;  Laterality: N/A;  . SHOULDER SURGERY  04/14/09   right   Past Medical History:  Diagnosis Date  . Abscess    increased drainage from abscess on buttock  . Anal fistula   . Anxiety   . Bilateral hip pain 05/27/2015  . Chronic pain syndrome 05/27/2015  . Depression    sees [name]  . Diabetes mellitus without complication ([place])   . Hyperlipidemia   . Hypertension   . Sleep apnea    2008- sleep study, neg. for sleep apnea   . Stroke ([place])   . SVT (supraventricular tachycardia) ([place])   . Symptomatic cholelithiasis 09/11/2018   Pulse 81   Temp 98.6 F (37 C)   Ht 5\' 3"  (1.6 m)   Wt 268 lb (121.6 kg)   LMP 02/09/2017   SpO2 95%   BMI 47.47 kg/m   Opioid Risk Score:   Fall Risk Score:  `1  Depression screen PHQ 2/9  Depression screen [facility] 2/9 09/03/2019 07/06/2019 06/30/2019 10/28/2017 10/02/2017 07/15/2017 05/23/2017  Decreased Interest 0 1 0 0 0  0 1  Down, Depressed, Hopeless 0 1 0 0 3 0 1  PHQ - 2 Score 0 2 0 0 3 0 2  Altered sleeping 1 1 - - 1 - 0  Tired, decreased energy 1 2 - - 0 - 1  Change in appetite 0 0 - - 1 - 0  Feeling bad or failure about yourself  0 0 - - 0 - 0  Trouble concentrating 0 1 - - 0 - 0  Moving slowly or fidgety/restless 0 0 - - 0 -  0  Suicidal thoughts 0 0 - - 0 - 0  PHQ-9 Score 2 6 - - 5 - 3  Difficult doing work/chores - Not difficult at all - - Somewhat difficult - -  Some recent data might be hidden    Review of Systems  Constitutional: Negative.   HENT: Negative.   Eyes: Negative.   Respiratory: Negative.   Cardiovascular: Negative.   Gastrointestinal: Negative.   Endocrine: Negative.   Genitourinary: Negative.   Musculoskeletal: Positive for arthralgias and back pain.  Skin: Negative.   Allergic/Immunologic: Negative.   Neurological: Positive for weakness.       Tingling   Hematological: Negative.   Psychiatric/Behavioral: Negative.   All other systems reviewed and are negative.      Objective:   Physical Exam Vitals and nursing note reviewed.  Constitutional:      Appearance: Normal appearance.  Cardiovascular:     Rate and Rhythm: Normal rate and regular rhythm.     Pulses: Normal pulses.     Heart sounds: Normal heart sounds.  Pulmonary:     Effort: Pulmonary effort is normal.     Breath sounds: Normal breath sounds.  Musculoskeletal:     Cervical back: Normal range of motion and neck supple.     Comments: Normal Muscle Bulk and Muscle Testing Reveals:  Upper Extremities: Full ROM and Muscle Strength on Right 4/5 and Left 5/5   Lumbar Paraspinal Tenderness: L-3-L-5 Left Greater Trochanteric Tenderness Lower Extremities: Full ROM and Muscle Strength 5/5 Arises from Table with Ease Narrow Based Gait  Skin:    General: Skin is warm and dry.  Neurological:     Mental Status: She is alert and oriented to person, place, and time.  Psychiatric:        Mood and Affect:  Mood normal.        Behavior: Behavior normal.           Assessment & Plan:  1.Chronic BilateralHip pain L>R---endstage OA of hips..Continue HEP as tolerated and Heat and Ice Therapy. Refilled:Hydrocodone 10/325 one q6 hours as needed for moderate pain #120. Second script e-scribed for the following month.12/28/2019 We will continue the opioid monitoring program, this consists of regular clinic visits, examinations, urine drug screen, pill counts as well as use of [place] Controlled Substance Reporting system. A 12 month History has been reviewed on the [place] Controlled Substance Reporting System on 12/28/2019 2. Lumbar Radiculitis:Continue medication regimen withPamelor. Continue Cymbalta and Continue HEP as Tolerated. Continue to Monitor.12/28/2019.- 3. Morbid obesity: Continue with healthy diet Regime and HEP. Encouraged to continue with healthy diet regime andHEP.12/28/2019 4.BilateralGreater Trochanteric Bursitis:.Continue with Heat and Ice Therapy. Continue to Monitor.12/28/2019. 5. Left Knee Pain:No complaintsToday.Continue with HEP as Tolerated. Continue to Monitor.12/28/2019 6. Muscle Spasm: Continue  Methocarbamol. Continue to monitor.   48minutes of face to face patient care time was spent during this visit. All questions were encouraged and answered.  F/U in55months

## 2020-01-05 ENCOUNTER — Ambulatory Visit: Payer: BC Managed Care – PPO

## 2020-01-10 ENCOUNTER — Other Ambulatory Visit: Payer: Self-pay | Admitting: Medical

## 2020-01-10 ENCOUNTER — Other Ambulatory Visit: Payer: Self-pay | Admitting: Physical Medicine & Rehabilitation

## 2020-01-10 ENCOUNTER — Other Ambulatory Visit: Payer: Self-pay | Admitting: Family Medicine

## 2020-01-10 DIAGNOSIS — G894 Chronic pain syndrome: Secondary | ICD-10-CM

## 2020-01-10 DIAGNOSIS — R609 Edema, unspecified: Secondary | ICD-10-CM

## 2020-01-10 DIAGNOSIS — I471 Supraventricular tachycardia: Secondary | ICD-10-CM

## 2020-01-10 DIAGNOSIS — M160 Bilateral primary osteoarthritis of hip: Secondary | ICD-10-CM

## 2020-01-10 DIAGNOSIS — Z79899 Other long term (current) drug therapy: Secondary | ICD-10-CM

## 2020-01-10 DIAGNOSIS — Z5181 Encounter for therapeutic drug level monitoring: Secondary | ICD-10-CM

## 2020-01-11 NOTE — Telephone Encounter (Signed)
[facility] is requesting to fill pt Cymbalta. Please advise [facility]

## 2020-02-02 ENCOUNTER — Other Ambulatory Visit: Payer: Self-pay | Admitting: Medical

## 2020-02-02 DIAGNOSIS — Z1231 Encounter for screening mammogram for malignant neoplasm of breast: Secondary | ICD-10-CM

## 2020-02-08 ENCOUNTER — Other Ambulatory Visit: Payer: Self-pay | Admitting: Medical

## 2020-02-08 DIAGNOSIS — R609 Edema, unspecified: Secondary | ICD-10-CM

## 2020-02-08 DIAGNOSIS — I471 Supraventricular tachycardia: Secondary | ICD-10-CM

## 2020-02-22 ENCOUNTER — Ambulatory Visit: Payer: BC Managed Care – PPO | Admitting: Adult Health

## 2020-02-25 ENCOUNTER — Encounter: Payer: Self-pay | Admitting: Registered Nurse

## 2020-02-25 ENCOUNTER — Other Ambulatory Visit: Payer: Self-pay

## 2020-02-25 ENCOUNTER — Encounter: Payer: BC Managed Care – PPO | Attending: Physical Medicine & Rehabilitation | Admitting: Registered Nurse

## 2020-02-25 VITALS — BP 134/76 | HR 80 | Temp 98.4°F | Ht 63.0 in | Wt 274.0 lb

## 2020-02-25 DIAGNOSIS — Z79891 Long term (current) use of opiate analgesic: Secondary | ICD-10-CM | POA: Diagnosis present

## 2020-02-25 DIAGNOSIS — M7062 Trochanteric bursitis, left hip: Secondary | ICD-10-CM | POA: Insufficient documentation

## 2020-02-25 DIAGNOSIS — M7061 Trochanteric bursitis, right hip: Secondary | ICD-10-CM | POA: Diagnosis not present

## 2020-02-25 DIAGNOSIS — M5416 Radiculopathy, lumbar region: Secondary | ICD-10-CM

## 2020-02-25 DIAGNOSIS — Z5181 Encounter for therapeutic drug level monitoring: Secondary | ICD-10-CM | POA: Diagnosis not present

## 2020-02-25 DIAGNOSIS — G894 Chronic pain syndrome: Secondary | ICD-10-CM | POA: Diagnosis present

## 2020-02-25 DIAGNOSIS — M62830 Muscle spasm of back: Secondary | ICD-10-CM | POA: Diagnosis present

## 2020-02-25 MED ORDER — HYDROCODONE-ACETAMINOPHEN 10-325 MG PO TABS: 1.0000 | ORAL_TABLET | Freq: Four times a day (QID) | ORAL | 0 refills | Status: DC | PRN

## 2020-02-25 NOTE — Progress Notes (Signed)
Subjective:    Patient ID: [name], female    DOB: 04-Jul-1960, 59 y.o.   MRN: [id number]  HPI: [name] is a 59 y.o. female who returns for follow up appointment for chronic pain and medication refill. She states her pain is located in her lower back radiating into her bilateral hips and bilateral lower extremities. She  rates her pain 7. Her current exercise regime is walking and performing stretching exercises.  Ms. [place] Morphine equivalent is 40.00 MME.    Last Oral Swab was Performed on 09/01/2019, it was consistent.    Pain Inventory Average Pain 8 Pain Right Now 7 My pain is sharp, dull and stabbing  In the last 24 hours, has pain interfered with the following? General activity 3 Relation with others 10 Enjoyment of life 5 What TIME of day is your pain at its worst? daytime and night Sleep (in general) NA  Pain is worse with: walking, bending and standing Pain improves with: rest, therapy/exercise and medication Relief from Meds: 5  Family History  Problem Relation Age of Onset  . Hypertension Mother   . Alzheimer's disease Mother   . Diabetes Father   . Breast cancer Sister 75  . Anesthesia problems Neg Hx   . Hypotension Neg Hx   . Malignant hyperthermia Neg Hx   . Pseudochol deficiency Neg Hx   . Colon cancer Neg Hx   . Esophageal cancer Neg Hx   . Stomach cancer Neg Hx   . Rectal cancer Neg Hx   . Thyroid disease Neg Hx    Social History   Socioeconomic History  . Marital status: Divorced    Spouse name: Not on file  . Number of children: 0  . Years of education: Not on file  . Highest education level: Not on file  Occupational History  . Occupation: TEACHER ASSISTANT  Tobacco Use  . Smoking status: Never Smoker  . Smokeless tobacco: Never Used  Vaping Use  . Vaping Use: Never used  Substance and Sexual Activity  . Alcohol use: No  . Drug use: No  . Sexual activity: Yes  Other Topics Concern  . Not on file  Social  History Narrative  . Not on file   Social Determinants of Health   Financial Resource Strain: Not on file  Food Insecurity: Food Insecurity Present  . Worried About Charity fundraiser in the Last Year: Sometimes true  . Ran Out of Food in the Last Year: Sometimes true  Transportation Needs: No Transportation Needs  . Lack of Transportation (Medical): No  . Lack of Transportation (Non-Medical): No  Physical Activity: Not on file  Stress: Not on file  Social Connections: Not on file   Past Surgical History:  Procedure Laterality Date  . ANAL EXAMINATION UNDER ANESTHESIA  02/21/11   anal fistula  . BREAST SURGERY  patient does not remember date of procedure   pull fluid off lft br  . CHOLECYSTECTOMY N/A 09/12/2018   Procedure: LAPAROSCOPIC CHOLECYSTECTOMY;  Surgeon: Stark Klein, MD;  Location: [place];  Service: General;  Laterality: N/A;  . ELECTROPHYSIOLOGIC STUDY N/A 05/05/2015   Procedure: SVT Ablation;  Surgeon: Will Meredith Leeds, MD;  Location: [facility] [facility];  Service: Cardiovascular;  Laterality: N/A;  . EP IMPLANTABLE DEVICE N/A 01/30/2016   Procedure: Loop Recorder Insertion;  Surgeon: Sanda Klein, MD;  Location: [facility] [facility];  Service: Cardiovascular;  Laterality: N/A;  . INCISE AND DRAIN ABCESS  abscess on right thigh and buttock  . KNEE ARTHROSCOPY     left  . LAPAROSCOPIC APPENDECTOMY N/A 09/19/2018   Procedure: APPENDECTOMY LAPAROSCOPIC;  Surgeon: Clovis Riley, MD;  Location: [facility];  Service: General;  Laterality: N/A;  . SHOULDER SURGERY  04/14/09   right   Past Surgical History:  Procedure Laterality Date  . ANAL EXAMINATION UNDER ANESTHESIA  02/21/11   anal fistula  . BREAST SURGERY  patient does not remember date of procedure   pull fluid off lft br  . CHOLECYSTECTOMY N/A 09/12/2018   Procedure: LAPAROSCOPIC CHOLECYSTECTOMY;  Surgeon: Stark Klein, MD;  Location: [place] [facility];  Service: General;  Laterality: N/A;  . ELECTROPHYSIOLOGIC STUDY N/A  05/05/2015   Procedure: SVT Ablation;  Surgeon: Will Meredith Leeds, MD;  Location: [place] [facility];  Service: Cardiovascular;  Laterality: N/A;  . EP IMPLANTABLE DEVICE N/A 01/30/2016   Procedure: Loop Recorder Insertion;  Surgeon: Sanda Klein, MD;  Location: [facility];  Service: Cardiovascular;  Laterality: N/A;  . INCISE AND DRAIN ABCESS     abscess on right thigh and buttock  . KNEE ARTHROSCOPY     left  . LAPAROSCOPIC APPENDECTOMY N/A 09/19/2018   Procedure: APPENDECTOMY LAPAROSCOPIC;  Surgeon: Clovis Riley, MD;  Location: [place];  Service: General;  Laterality: N/A;  . SHOULDER SURGERY  04/14/09   right   Past Medical History:  Diagnosis Date  . Abscess    increased drainage from abscess on buttock  . Anal fistula   . Anxiety   . Bilateral hip pain 05/27/2015  . Chronic pain syndrome 05/27/2015  . Depression    sees [name]  . Diabetes mellitus without complication ([place])   . Hyperlipidemia   . Hypertension   . Sleep apnea    2008- sleep study, neg. for sleep apnea   . Stroke ([place])   . SVT (supraventricular tachycardia) ([place])   . Symptomatic cholelithiasis 09/11/2018   BP 134/76   Pulse 80   Temp 98.4 F (36.9 C)   Ht 5\' 3"  (1.6 m)   Wt 274 lb (124.3 kg)   LMP 02/09/2017   SpO2 95%   BMI 48.54 kg/m   Opioid Risk Score:   Fall Risk Score:  `1  Depression screen PHQ 2/9  Depression screen [facility] 2/9 09/03/2019 07/06/2019 06/30/2019 10/28/2017 10/02/2017 07/15/2017  Decreased Interest 0 1 0 0 0 0  Down, Depressed, Hopeless 0 1 0 0 3 0  PHQ - 2 Score 0 2 0 0 3 0  Altered sleeping 1 1 - - 1 -  Tired, decreased energy 1 2 - - 0 -  Change in appetite 0 0 - - 1 -  Feeling bad or failure about yourself  0 0 - - 0 -  Trouble concentrating 0 1 - - 0 -  Moving slowly or fidgety/restless 0 0 - - 0 -  Suicidal thoughts 0 0 - - 0 -  PHQ-9 Score 2 6 - - 5 -  Difficult doing work/chores - Not difficult at all - - Somewhat difficult -  Some recent data might be  hidden    Review of Systems  Constitutional: Negative.   HENT: Negative.   Eyes: Negative.   Respiratory: Negative.   Cardiovascular: Negative.   Gastrointestinal: Negative.   Endocrine: Negative.   Genitourinary: Negative.   Musculoskeletal: Positive for back pain.  Skin: Negative.   Allergic/Immunologic: Negative.   Neurological: Negative.   Hematological: Negative.   Psychiatric/Behavioral:  Negative.   All other systems reviewed and are negative.      Objective:   Physical Exam Vitals and nursing note reviewed.  Constitutional:      Appearance: Normal appearance. She is obese.  Cardiovascular:     Rate and Rhythm: Normal rate and regular rhythm.     Pulses: Normal pulses.     Heart sounds: Normal heart sounds.  Pulmonary:     Effort: Pulmonary effort is normal.     Breath sounds: Normal breath sounds.  Musculoskeletal:     Cervical back: Normal range of motion and neck supple.     Comments: Normal Muscle Bulk and Muscle Testing Reveals:  Upper Extremities: Full ROM and Muscle Strength 5/5 Lumbar Paraspinal Tenderness: L-3-L-5 Bilateral Greater Trochanter Tenderness Lower Extremities: Full ROM and Muscle Strength 5/5 Arises from Table with ease Narrow Based  Gait   Skin:    General: Skin is warm and dry.  Neurological:     Mental Status: She is alert and oriented to person, place, and time.  Psychiatric:        Mood and Affect: Mood normal.        Behavior: Behavior normal.           Assessment & Plan:  1.Chronic BilateralHip pain L>R---endstage OA of hips..Continue HEP as tolerated and Heat and Ice Therapy. Refilled:Hydrocodone 10/325 one q6 hours as needed for moderate pain #120. Second script e-scribed for the following month.02/25/2020 We will continue the opioid monitoring program, this consists of regular clinic visits, examinations, urine drug screen, pill counts as well as use of [place] Controlled Substance Reporting system. A 12 month  History has been reviewed on the [place] Controlled Substance Reporting System on 02/25/2020 2. Lumbar Radiculitis:Continue medication regimen withPamelor. Continue Cymbalta and Continue HEP as Tolerated. Continue to Monitor.02/25/2020.- 3. Morbid obesity: Continue with healthy diet Regime and HEP. Encouraged to continue with healthy diet regime andHEP.02/25/2020 4.BilateralGreater Trochanteric Bursitis:.Continue with Heat and Ice Therapy. Continue to Monitor.02/25/2020. 5. Left Knee Pain:No complaintsToday.Continue with HEP as Tolerated. Continue to Monitor.02/25/2020 6. Muscle Spasm: Continue  Methocarbamol. Continue to monitor.02/25/2020.   F/U in68months

## 2020-02-29 ENCOUNTER — Encounter: Payer: Self-pay | Admitting: Medical

## 2020-02-29 ENCOUNTER — Ambulatory Visit: Payer: BC Managed Care – PPO | Admitting: Medical

## 2020-02-29 ENCOUNTER — Other Ambulatory Visit: Payer: Self-pay

## 2020-02-29 VITALS — BP 122/70 | HR 86 | Ht 63.0 in | Wt 269.6 lb

## 2020-02-29 DIAGNOSIS — Z6841 Body Mass Index (BMI) 40.0 and over, adult: Secondary | ICD-10-CM

## 2020-02-29 DIAGNOSIS — E790 Hyperuricemia without signs of inflammatory arthritis and tophaceous disease: Secondary | ICD-10-CM

## 2020-02-29 DIAGNOSIS — B373 Candidiasis of vulva and vagina: Secondary | ICD-10-CM

## 2020-02-29 DIAGNOSIS — E042 Nontoxic multinodular goiter: Secondary | ICD-10-CM

## 2020-02-29 DIAGNOSIS — F411 Generalized anxiety disorder: Secondary | ICD-10-CM

## 2020-02-29 DIAGNOSIS — L304 Erythema intertrigo: Secondary | ICD-10-CM | POA: Insufficient documentation

## 2020-02-29 DIAGNOSIS — IMO0002 Reserved for concepts with insufficient information to code with codable children: Secondary | ICD-10-CM

## 2020-02-29 DIAGNOSIS — R748 Abnormal levels of other serum enzymes: Secondary | ICD-10-CM

## 2020-02-29 DIAGNOSIS — R7989 Other specified abnormal findings of blood chemistry: Secondary | ICD-10-CM

## 2020-02-29 DIAGNOSIS — E1165 Type 2 diabetes mellitus with hyperglycemia: Secondary | ICD-10-CM

## 2020-02-29 DIAGNOSIS — E876 Hypokalemia: Secondary | ICD-10-CM

## 2020-02-29 DIAGNOSIS — G4700 Insomnia, unspecified: Secondary | ICD-10-CM

## 2020-02-29 DIAGNOSIS — E1169 Type 2 diabetes mellitus with other specified complication: Secondary | ICD-10-CM

## 2020-02-29 DIAGNOSIS — G894 Chronic pain syndrome: Secondary | ICD-10-CM

## 2020-02-29 DIAGNOSIS — Z8673 Personal history of transient ischemic attack (TIA), and cerebral infarction without residual deficits: Secondary | ICD-10-CM | POA: Diagnosis not present

## 2020-02-29 DIAGNOSIS — E785 Hyperlipidemia, unspecified: Secondary | ICD-10-CM | POA: Insufficient documentation

## 2020-02-29 DIAGNOSIS — R32 Unspecified urinary incontinence: Secondary | ICD-10-CM

## 2020-02-29 DIAGNOSIS — Z87898 Personal history of other specified conditions: Secondary | ICD-10-CM | POA: Diagnosis not present

## 2020-02-29 DIAGNOSIS — D72829 Elevated white blood cell count, unspecified: Secondary | ICD-10-CM

## 2020-02-29 DIAGNOSIS — F419 Anxiety disorder, unspecified: Secondary | ICD-10-CM

## 2020-02-29 DIAGNOSIS — D329 Benign neoplasm of meninges, unspecified: Secondary | ICD-10-CM

## 2020-02-29 DIAGNOSIS — Z7185 Encounter for immunization safety counseling: Secondary | ICD-10-CM | POA: Insufficient documentation

## 2020-02-29 DIAGNOSIS — E118 Type 2 diabetes mellitus with unspecified complications: Secondary | ICD-10-CM | POA: Diagnosis not present

## 2020-02-29 DIAGNOSIS — B3731 Acute candidiasis of vulva and vagina: Secondary | ICD-10-CM

## 2020-02-29 DIAGNOSIS — I10 Essential (primary) hypertension: Secondary | ICD-10-CM

## 2020-02-29 NOTE — Progress Notes (Signed)
Subjective:   HPI  [name] is a 59 y.o. female who presents for Chief Complaint  Patient presents with  . Medication Management    Discuss medication with fasting labs     Patient Care Team: Deniel Mcquiston, Camelia Eng, PA-C as PCP - General (Family Medicine) Renato Shin, MD as Consulting Physician (Endocrinology) Jovita Kussmaul, MD as Consulting Physician (General Surgery) Sanda Klein, MD as Consulting Physician (Cardiology) New dentist in June Eye doctor - Dr. Susa Griffins [facility], Dr. Zenovia Jarred Dr. Alger Simons, pain management   Concerns: Here for med check  She missed her follow-up appointment with endocrinology due to tight work schedule, could not get off of work.  She notes that she has not discussed thyroid with endocrinology.  She would like to switch to endocrinologist due to some issues with rude nursing staff.  Otherwise, she notes ongoing open wounds in irritated skin in the inner folds of her upper inner thighs for over a year now.  She has a history of hidradenitis in the armpits that was corrected with surgery in the past.  She has on and off issues with yeast vaginitis  S/p stroke 06/08/2019 with right side numbness and weakness.    She reports compliance with her medicines for blood pressure and cholesterol  She reports compliance with medications for diabetes including Farxiga 5 mg daily, Basaglar 100 units daily, and Fiasp insulin 30 units per meal 3 times a day.  No recent polyuria, polydipsia, vision changes.  She is checking her blood sugars.  No foot concerns   Past Medical History:  Diagnosis Date  . Abscess    increased drainage from abscess on buttock  . Anal fistula   . Anxiety   . Bilateral hip pain 05/27/2015  . Chronic pain syndrome 05/27/2015  . Depression    sees [name]  . Diabetes mellitus without complication ([place])   . Hyperlipidemia   . Hypertension   . Sleep apnea    2008- sleep study, neg. for sleep apnea    . Stroke ([place])   . SVT (supraventricular tachycardia) ([place])   . Symptomatic cholelithiasis 09/11/2018    Past Surgical History:  Procedure Laterality Date  . ANAL EXAMINATION UNDER ANESTHESIA  02/21/11   anal fistula  . BREAST SURGERY  patient does not remember date of procedure   pull fluid off lft br  . CHOLECYSTECTOMY N/A 09/12/2018   Procedure: LAPAROSCOPIC CHOLECYSTECTOMY;  Surgeon: Stark Klein, MD;  Location: [place];  Service: General;  Laterality: N/A;  . ELECTROPHYSIOLOGIC STUDY N/A 05/05/2015   Procedure: SVT Ablation;  Surgeon: Will Meredith Leeds, MD;  Location: [place] [facility];  Service: Cardiovascular;  Laterality: N/A;  . EP IMPLANTABLE DEVICE N/A 01/30/2016   Procedure: Loop Recorder Insertion;  Surgeon: Sanda Klein, MD;  Location: [place] [facility];  Service: Cardiovascular;  Laterality: N/A;  . INCISE AND DRAIN ABCESS     abscess on right thigh and buttock  . KNEE ARTHROSCOPY     left  . LAPAROSCOPIC APPENDECTOMY N/A 09/19/2018   Procedure: APPENDECTOMY LAPAROSCOPIC;  Surgeon: Clovis Riley, MD;  Location: [place];  Service: General;  Laterality: N/A;  . SHOULDER SURGERY  04/14/09   right    Social History   Socioeconomic History  . Marital status: Divorced    Spouse name: Not on file  . Number of children: 0  . Years of education: Not on file  . Highest education level: Not on file  Occupational  History  . Occupation: TEACHER ASSISTANT  Tobacco Use  . Smoking status: Never Smoker  . Smokeless tobacco: Never Used  Vaping Use  . Vaping Use: Never used  Substance and Sexual Activity  . Alcohol use: No  . Drug use: No  . Sexual activity: Yes  Other Topics Concern  . Not on file  Social History Narrative  . Not on file   Social Determinants of Health   Financial Resource Strain: Not on file  Food Insecurity: Food Insecurity Present  . Worried About Charity fundraiser in the Last Year: Sometimes true  . Ran Out of Food in the Last Year:  Sometimes true  Transportation Needs: No Transportation Needs  . Lack of Transportation (Medical): No  . Lack of Transportation (Non-Medical): No  Physical Activity: Not on file  Stress: Not on file  Social Connections: Not on file  Intimate Partner Violence: Not on file    Family History  Problem Relation Age of Onset  . Hypertension Mother   . Alzheimer's disease Mother   . Diabetes Father   . Breast cancer Sister 18  . Anesthesia problems Neg Hx   . Hypotension Neg Hx   . Malignant hyperthermia Neg Hx   . Pseudochol deficiency Neg Hx   . Colon cancer Neg Hx   . Esophageal cancer Neg Hx   . Stomach cancer Neg Hx   . Rectal cancer Neg Hx   . Thyroid disease Neg Hx      Current Outpatient Medications:  .  aspirin 81 MG EC tablet, Take 1 tablet (81 mg total) by mouth daily., Disp: , Rfl:  .  atorvastatin (LIPITOR) 40 MG tablet, TAKE 1 TABLET BY MOUTH AT BEDTIME, Disp: 90 tablet, Rfl: 0 .  carvedilol (COREG) 6.25 MG tablet, Take 1 tablet by mouth twice daily, Disp: 60 tablet, Rfl: 0 .  DEXILANT 60 MG capsule, Take 1 capsule by mouth once daily, Disp: 30 capsule, Rfl: 0 .  diclofenac (VOLTAREN) 50 MG EC tablet, Take 1 tablet by mouth twice daily, Disp: 180 tablet, Rfl: 0 .  DULoxetine (CYMBALTA) 60 MG capsule, Take 1 capsule by mouth twice daily, Disp: 180 capsule, Rfl: 0 .  FARXIGA 5 MG TABS tablet, TAKE 1 TABLET BY MOUTH IN THE MORNING, Disp: 90 tablet, Rfl: 0 .  Ferrous Gluconate 324 (37.5 Fe) MG TABS, TAKE 1 TABLET BY MOUTH TWICE DAILY WITH A MEAL, Disp: 180 tablet, Rfl: 0 .  FIASP FLEXTOUCH 100 UNIT/ML FlexTouch Pen, SMARTSIG:30 Unit(s) SUB-Q 3 Times Daily, Disp: , Rfl:  .  furosemide (LASIX) 80 MG tablet, Take 1 tablet by mouth once daily, Disp: 30 tablet, Rfl: 0 .  glucose blood test strip, ONE TOUCH VERIO TEST STRIPS TO TEST 1-2 TIMES A DAY, Disp: 100 each, Rfl: 12 .  HYDROcodone-acetaminophen (NORCO) 10-325 MG tablet, Take 1 tablet by mouth every 6 (six) hours as needed.  Please do not  Fill before 03/29/2020, Disp: 120 tablet, Rfl: 0 .  Insulin Glargine (BASAGLAR KWIKPEN) 100 UNIT/ML, Inject 0.9 mLs (90 Units total) into the skin every morning. And pen needles 1/day (Patient taking differently: Inject 100 Units into the skin every morning. And pen needles 1/day), Disp: 40 pen, Rfl: 3 .  methocarbamol (ROBAXIN) 500 MG tablet, Take 1 tablet (500 mg total) by mouth 2 (two) times daily as needed for muscle spasms., Disp: 60 tablet, Rfl: 2 .  nortriptyline (PAMELOR) 25 MG capsule, Take 1 capsule (25 mg total) by mouth at bedtime.,  Disp: 90 capsule, Rfl: 1 .  potassium chloride (KLOR-CON) 10 MEQ tablet, Take 1 tablet by mouth once daily, Disp: 30 tablet, Rfl: 0  Allergies  Allergen Reactions  . Ozempic (0.25 Or 0.5 Mg-Dose) [Semaglutide(0.25 Or 0.5mg -Dos)]     Severe nausea, bad gas  . Tramadol Nausea Only    Reviewed their medical, surgical, family, social, medication, and allergy history and updated chart as appropriate.   Review of Systems Constitutional: -fever, -chills, -sweats, -unexpected weight change, -decreased appetite, -fatigue Allergy: -sneezing, -itching, -congestion Dermatology: -changing moles, --rash, -lumps ENT: -runny nose, -ear pain, -sore throat, -hoarseness, -sinus pain, -teeth pain, - ringing in ears, -hearing loss, -nosebleeds Cardiology: -chest pain, -palpitations, -swelling, -difficulty breathing when lying flat, -waking up short of breath Respiratory: -cough, -shortness of breath, -difficulty breathing with exercise or exertion, -wheezing, -coughing up blood Gastroenterology: -abdominal pain, -nausea, -vomiting, -diarrhea, -constipation, -blood in stool, -changes in bowel movement, -difficulty swallowing or eating Hematology: -bleeding, -bruising  Musculoskeletal: -joint aches, -muscle aches, -joint swelling,  +chronic back pain, -neck pain, -cramping, -changes in gait Ophthalmology: denies vision changes, eye redness, itching,  discharge Urology: -burning with urination, -difficulty urinating, -blood in urine, -urinary frequency, -urgency, -incontinence Neurology: -headache, -weakness, -tingling, -numbness, -memory loss, -falls, -dizziness Psychology: -depressed mood, -agitation, -sleep problems Breast/gyn: -breast tendnerss, -discharge, -lumps, -vaginal discharge,- irregular periods, -heavy periods      Objective:  BP 122/70   Pulse 86   Ht 5\' 3"  (1.6 m)   Wt 269 lb 9.6 oz (122.3 kg)   LMP 02/09/2017   SpO2 98%   BMI 47.76 kg/m   Wt Readings from Last 3 Encounters:  02/29/20 269 lb 9.6 oz (122.3 kg)  02/25/20 274 lb (124.3 kg)  12/28/19 268 lb (121.6 kg)    General appearance: alert, no distress, WD/WN, Hispanic female Skin: Upper inner thigh folds intertrigo this area with pinkish-grayish skin breakdown, open ulcerations but no pus drainage no surrounding erythema noted foul odor or pus Neck: supple, no lymphadenopathy, no thyromegaly, no masses, normal ROM, no bruits Chest: non tender, normal shape and expansion Heart: RRR, normal S1, S2, no murmurs Lungs: CTA bilaterally, no wheezes, rhonchi, or rales Abdomen: +bs, soft, non tender, non distended, no masses, no hepatomegaly, no splenomegaly, no bruits Extremities: no edema, no cyanosis, no clubbing Pulses: 2+ symmetric, upper and 1+ lower extremities, normal cap refill Psychiatric: normal affect, behavior normal, pleasant  Breast/gyn/rectal - deferred to gynecology   Diabetic Foot Exam - Simple   Simple Foot Form Diabetic Foot exam was performed with the following findings: Yes 02/29/2020  9:53 AM  Visual Inspection No deformities, no ulcerations, no other skin breakdown bilaterally: Yes Sensation Testing Intact to touch and monofilament testing bilaterally: Yes Pulse Check Posterior Tibialis and Dorsalis pulse intact bilaterally: Yes Comments      Assessment and Plan :   Encounter Diagnoses  Name Primary?  Marland Kitchen Uncontrolled diabetes  mellitus with complications ([place]) Yes  . History of stroke   . Meningioma ([place])   . History of multiple pulmonary nodules   . Hyperlipidemia, unspecified hyperlipidemia type   . Hyperuricemia   . Hypokalemia   . Leukocytosis, unspecified type   . Low TSH level   . Insomnia, unspecified type   . Urinary incontinence, unspecified type   . Anxiety   . Chronic pain syndrome   . Generalized anxiety disorder   . Multinodular goiter   . Hyperlipidemia associated with type 2 diabetes mellitus ([place])   . Intertrigo   .  Yeast vaginitis   . Vaccine counseling   . BMI 45.0-49.9, adult ([place])   . Essential hypertension, benign     Uncontrolled diabetes -updated labs today as she missed her endocrinology appointment in the fall.  Need to stop [place] given the recurrent yeast infection.  Continue Basaglar 100 units daily, Fiasp mealtime insulin 30 units with meals.  Referral to different endocrinologist at her request.  Discussed routine care, daily foot checks, glucometer testing  abnormal thyroid labs-low TSH, low T4, advise follow-up with endocrinology for further evaluation and management  Obesity-discussed the need for weight loss specifically as it relates to her overall health issues and the intertrigo  Chronically elevated white cells-she does have intertrigo issues, chronic back pain inflammation.  Discussed other possible differential.  Pending labs may need referral to hematology  Hypertension-continue current medications  Hyperlipidemia associated with diabetes-continue current medications, statin, limit high cholesterol foods  Pulmonary nodule follow-up-plan for CT chest  Chronic back pain-managed by specialist  History of elevated uric acid but last uric acid in chart reviewed and within decent range  History of stroke-continue statin, good blood pressure control and risk factor reduction  Meningioma-noted on MRI 05/2019.  Managed by neurology  Insomnia-counseled on sleep  hygiene, c/t current therapies  Anxiety, PTSD-advise counseling, continue current medication   Vaccinations: Up-to-date on Covid vaccine Up-to-date on Covid booster and flu shot recently at [facility] Up-to-date on tetanus Up-to-date on Shingrix Up-to-date on pneumococcal vaccine     [name] was seen today for medication management.  Diagnoses and all orders for this visit:  Uncontrolled diabetes mellitus with complications ([place]) -     Hemoglobin A1c -     Comprehensive metabolic panel -     Lipid panel  History of stroke  Meningioma ([facility])  History of multiple pulmonary nodules  Hyperlipidemia, unspecified hyperlipidemia type -     Lipid panel  Hyperuricemia  Hypokalemia  Leukocytosis, unspecified type -     CBC with Differential/Platelet -     Pathologist smear review -     Comprehensive metabolic panel  Low TSH level  Insomnia, unspecified type  Urinary incontinence, unspecified type  Anxiety  Chronic pain syndrome  Generalized anxiety disorder  Multinodular goiter  Hyperlipidemia associated with type 2 diabetes mellitus ([place]) -     Comprehensive metabolic panel -     Lipid panel  Intertrigo  Yeast vaginitis  Vaccine counseling  BMI 45.0-49.9, adult ([facility])  Essential hypertension, benign    Follow-up pending labs, yearly for physical in April

## 2020-03-01 ENCOUNTER — Other Ambulatory Visit: Payer: Self-pay | Admitting: Medical

## 2020-03-01 DIAGNOSIS — Z87898 Personal history of other specified conditions: Secondary | ICD-10-CM

## 2020-03-01 DIAGNOSIS — R609 Edema, unspecified: Secondary | ICD-10-CM

## 2020-03-01 DIAGNOSIS — I471 Supraventricular tachycardia: Secondary | ICD-10-CM

## 2020-03-01 DIAGNOSIS — I4719 Other supraventricular tachycardia: Secondary | ICD-10-CM

## 2020-03-01 MED ORDER — CARVEDILOL 6.25 MG PO TABS: 6.2500 mg | ORAL_TABLET | Freq: Two times a day (BID) | ORAL | 3 refills | Status: DC

## 2020-03-01 MED ORDER — POTASSIUM CHLORIDE ER 10 MEQ PO TBCR: 10.0000 meq | EXTENDED_RELEASE_TABLET | Freq: Every day | ORAL | 3 refills | Status: DC

## 2020-03-01 MED ORDER — FIASP FLEXTOUCH 100 UNIT/ML ~~LOC~~ SOPN: PEN_INJECTOR | SUBCUTANEOUS | 1 refills | Status: DC

## 2020-03-01 MED ORDER — ASPIRIN 81 MG PO TBEC: 81.0000 mg | DELAYED_RELEASE_TABLET | Freq: Every day | ORAL | 3 refills | Status: DC

## 2020-03-01 MED ORDER — FLUCONAZOLE 100 MG PO TABS: 100.0000 mg | ORAL_TABLET | Freq: Every day | ORAL | 0 refills | Status: AC

## 2020-03-01 MED ORDER — ROSUVASTATIN CALCIUM 20 MG PO TABS: 20.0000 mg | ORAL_TABLET | Freq: Every day | ORAL | 3 refills | Status: DC

## 2020-03-01 MED ORDER — DEXILANT 60 MG PO CPDR: 1.0000 | DELAYED_RELEASE_CAPSULE | Freq: Every day | ORAL | 3 refills | Status: DC

## 2020-03-01 MED ORDER — FUROSEMIDE 80 MG PO TABS
80.0000 mg | ORAL_TABLET | Freq: Every day | ORAL | 3 refills | Status: DC
Start: 2020-03-01 — End: 2020-09-22

## 2020-03-01 MED ORDER — BASAGLAR KWIKPEN 100 UNIT/ML ~~LOC~~ SOPN
100.0000 [IU] | PEN_INJECTOR | SUBCUTANEOUS | 1 refills | Status: DC
Start: 2020-03-01 — End: 2020-04-12

## 2020-03-02 ENCOUNTER — Telehealth: Payer: Self-pay | Admitting: Medical

## 2020-03-02 ENCOUNTER — Other Ambulatory Visit: Payer: Self-pay

## 2020-03-02 DIAGNOSIS — IMO0002 Reserved for concepts with insufficient information to code with codable children: Secondary | ICD-10-CM

## 2020-03-02 DIAGNOSIS — R946 Abnormal results of thyroid function studies: Secondary | ICD-10-CM

## 2020-03-02 LAB — COMPREHENSIVE METABOLIC PANEL
ALT: 22 IU/L (ref 0–32)
AST: 15 IU/L (ref 0–40)
Albumin/Globulin Ratio: 1.7 (ref 1.2–2.2)
Albumin: 3.8 g/dL (ref 3.8–4.9)
Alkaline Phosphatase: 211 IU/L — ABNORMAL HIGH (ref 44–121)
BUN/Creatinine Ratio: 15 (ref 9–23)
BUN: 15 mg/dL (ref 6–24)
Bilirubin Total: 0.3 mg/dL (ref 0.0–1.2)
CO2: 24 mmol/L (ref 20–29)
Calcium: 9.6 mg/dL (ref 8.7–10.2)
Chloride: 107 mmol/L — ABNORMAL HIGH (ref 96–106)
Creatinine, Ser: 1.01 mg/dL — ABNORMAL HIGH (ref 0.57–1.00)
GFR calc Af Amer: 70 mL/min/{1.73_m2} (ref >59)
GFR calc non Af Amer: 61 mL/min/{1.73_m2} (ref >59)
Globulin, Total: 2.2 g/dL (ref 1.5–4.5)
Glucose: 73 mg/dL (ref 65–99)
Potassium: 4.3 mmol/L (ref 3.5–5.2)
Sodium: 143 mmol/L (ref 134–144)
Total Protein: 6 g/dL (ref 6.0–8.5)

## 2020-03-02 LAB — PATHOLOGIST SMEAR REVIEW
Basophils Absolute: 0.1 10*3/uL (ref 0.0–0.2)
Basos: 1 %
EOS (ABSOLUTE): 0.4 10*3/uL (ref 0.0–0.4)
Eos: 3 %
Hematocrit: 41.6 % (ref 34.0–46.6)
Hemoglobin: 13.1 g/dL (ref 11.1–15.9)
Immature Grans (Abs): 0.1 10*3/uL (ref 0.0–0.1)
Immature Granulocytes: 1 %
Lymphocytes Absolute: 2.2 10*3/uL (ref 0.7–3.1)
Lymphs: 20 %
MCH: 25 pg — ABNORMAL LOW (ref 26.6–33.0)
MCHC: 31.5 g/dL (ref 31.5–35.7)
MCV: 80 fL (ref 79–97)
Monocytes Absolute: 0.6 10*3/uL (ref 0.1–0.9)
Monocytes: 5 %
Neutrophils Absolute: 7.9 10*3/uL — ABNORMAL HIGH (ref 1.4–7.0)
Neutrophils: 70 %
Platelets: 428 10*3/uL (ref 150–450)
RBC: 5.23 x10E6/uL (ref 3.77–5.28)
RDW: 14.5 % (ref 11.7–15.4)
WBC: 11.1 10*3/uL — ABNORMAL HIGH (ref 3.4–10.8)

## 2020-03-02 LAB — LIPID PANEL
Chol/HDL Ratio: 5.3 ratio — ABNORMAL HIGH (ref 0.0–4.4)
Cholesterol, Total: 180 mg/dL (ref 100–199)
HDL: 34 mg/dL — ABNORMAL LOW (ref >39)
LDL Chol Calc (NIH): 122 mg/dL — ABNORMAL HIGH (ref 0–99)
Triglycerides: 133 mg/dL (ref 0–149)
VLDL Cholesterol Cal: 24 mg/dL (ref 5–40)

## 2020-03-02 LAB — HEMOGLOBIN A1C
Est. average glucose Bld gHb Est-mCnc: 266 mg/dL
Hgb A1c MFr Bld: 10.9 % — ABNORMAL HIGH (ref 4.8–5.6)

## 2020-03-02 NOTE — Telephone Encounter (Signed)
Refer to wound clinic for nonhealing wounds /intertrigo of both upper inner thighs.   We had a lunch yesterday with wound clinic.  Ask Gabriel Cirri for the contact.

## 2020-03-02 NOTE — Addendum Note (Signed)
Addended by: Carlena Hurl on: 03/02/2020 08:15 PM   Modules accepted: Orders

## 2020-03-02 NOTE — Telephone Encounter (Signed)
Done

## 2020-03-09 ENCOUNTER — Other Ambulatory Visit: Payer: BC Managed Care – PPO

## 2020-03-11 ENCOUNTER — Other Ambulatory Visit: Payer: Self-pay | Admitting: Registered Nurse

## 2020-03-11 DIAGNOSIS — G894 Chronic pain syndrome: Secondary | ICD-10-CM

## 2020-03-14 ENCOUNTER — Other Ambulatory Visit: Payer: BC Managed Care – PPO

## 2020-03-16 ENCOUNTER — Other Ambulatory Visit: Payer: BC Managed Care – PPO

## 2020-03-16 ENCOUNTER — Other Ambulatory Visit: Payer: Self-pay

## 2020-03-16 DIAGNOSIS — R748 Abnormal levels of other serum enzymes: Secondary | ICD-10-CM

## 2020-03-18 ENCOUNTER — Ambulatory Visit: Payer: BC Managed Care – PPO

## 2020-03-19 LAB — ALKALINE PHOSPHATASE, ISOENZYMES
Alkaline Phosphatase: 204 IU/L — ABNORMAL HIGH (ref 44–121)
BONE FRACTION: 47 % (ref 14–68)
INTESTINAL FRAC.: 2 % (ref 0–18)
LIVER FRACTION: 51 % (ref 18–85)

## 2020-03-22 ENCOUNTER — Telehealth: Payer: Self-pay

## 2020-03-22 ENCOUNTER — Other Ambulatory Visit: Payer: BC Managed Care – PPO

## 2020-03-22 NOTE — Telephone Encounter (Signed)
Pt scheduled to see Dr. Hilarie Fredrickson 05/09/20 at 9:50am.

## 2020-03-22 NOTE — Telephone Encounter (Signed)
-----  Message from Jerene Bears, MD sent at 03/22/2020  9:11 AM EST ----- [name] Thanks for the email. I do think this warrants eval with [place]. Do you plan on checking her Vit D? Typically fatty liver alone will not cause isolated alk phos, but we will check it out. I will get her an appt Thanks Ether Griffins,  OV, nonurgent, with me or app for isolated elevated alk phos.  ----- Message ----- From: Carlena Hurl, PA-C Sent: 03/21/2020   9:23 PM EST To: Jerene Bears, MD  Hello Dr. Luiz Blare  I recently did Alkaline Phosphatase isoenzymes on her.  We do see a fair amount of low vitamin D being a cause of elevated ALP's.  Would you be willing to look at her lab and comment on whether or not you need to see her in follow-up for this finding?  Do you see the elevated alkaline phosphatase related to fatty liver disease?  Thanks Dorothea Ogle, PA-C

## 2020-03-22 NOTE — Telephone Encounter (Signed)
[facility] called and stated a peer to peer is needed for patient to have imaging done.  Ref# [id number]  [phone] is the number to call

## 2020-03-24 ENCOUNTER — Encounter: Payer: Self-pay | Admitting: Adult Health

## 2020-03-24 ENCOUNTER — Ambulatory Visit: Payer: BC Managed Care – PPO | Admitting: Adult Health

## 2020-03-24 VITALS — BP 124/78 | HR 78 | Ht 63.0 in | Wt 272.0 lb

## 2020-03-24 DIAGNOSIS — I10 Essential (primary) hypertension: Secondary | ICD-10-CM | POA: Diagnosis not present

## 2020-03-24 DIAGNOSIS — I639 Cerebral infarction, unspecified: Secondary | ICD-10-CM

## 2020-03-24 DIAGNOSIS — E785 Hyperlipidemia, unspecified: Secondary | ICD-10-CM

## 2020-03-24 DIAGNOSIS — E1165 Type 2 diabetes mellitus with hyperglycemia: Secondary | ICD-10-CM

## 2020-03-24 DIAGNOSIS — R200 Anesthesia of skin: Secondary | ICD-10-CM | POA: Diagnosis not present

## 2020-03-24 DIAGNOSIS — E118 Type 2 diabetes mellitus with unspecified complications: Secondary | ICD-10-CM

## 2020-03-24 DIAGNOSIS — IMO0002 Reserved for concepts with insufficient information to code with codable children: Secondary | ICD-10-CM

## 2020-03-24 NOTE — Progress Notes (Signed)
[facility] [address] [place]. [place] [address] [phone]       STROKE FOLLOW UP NOTE  [name] Date of Birth:  1960/05/15 Medical Record Number:  [id number]   Reason for Referral: stroke follow up    SUBJECTIVE:   CHIEF COMPLAINT:  Chief Complaint  Patient presents with  . Follow-up    Stroke fu , tm rm, states she is doing much better, less numbness on right side, walking has improved, no longer using walker, and she is working, today c/o sharp pain on left side of head     HPI:   Today, 03/24/2020, [name] returns for a prolonged 30-month follow-up after prior visit approximately 8 months ago.  Doing well from a stroke standpoint without new or worsening stroke/TIA symptoms with improvement of right-sided numbness and balance.  She is ambulating without assistive device and has returned back to work without difficulty. She does report left-sided top of head quick 1-2 second electrical shock type zaps that occur on occasion which has been present since her stroke. Denies worsening and not associated with any other symptoms. Remains on aspirin 81 mg daily without bleeding or bruising.  Recent lipid panel showed elevated LDL therefore switch from atorvastatin to Crestor which she has tolerated without side effects.  Blood pressure today 124/78. Recent A1c elevated and is in the process of switching endocrinologist and reports some improvement of glucose levels. No concerns at this time.    History provided for reference purposes only Initial visit 07/20/2019 JM: Ms. Bringman is being seen for hospital follow-up.  Residual deficits imbalance with dizziness, and RUE numbness with decreased dexterity.  Continues to work with PT with ongoing improvement as well as Epley maneuver for likely BPPV and plans on starting OT soon. Use of rollator walker for long distance - no AD for short distance. Mild short term memory impairment and delayed  recall post stroke.  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  PCP initially recommended returning part-time but unfortunately her current employer will not honor restriction.  Completed 3 weeks DAPT and continues on aspirin alone without bleeding or bruising.  Continues on atorvastatin 40 mg daily without myalgias.  Blood pressure today 142/83. Monitors at home and has been stable. Glucose levels have improved - continues to follow with endocrinology. Prior evaluation for sleep apnea which was negative.  Loop recorder has not shown atrial fibrillation thus far.  No further concerns at this time.  Stroke admission 06/08/2019 [name] is a 60 y.o. female with history of HTN, DB, OSA, SVT, depression ands chronic pain  who presented on 06/08/2019 with R sided sensory numbness and paresthesias along with vertigo and an unsteady sensation x 3 days.   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  Recommended DAPT for 3 weeks then aspirin alone as previously not on antithrombotic.  History of HTN stable.  History of HLD with LDL 131 and increase atorvastatin dose from 20 mg to 40 mg daily.  History of DM uncontrolled with A1c 11.4.  Other stroke risk factors include diffuse intracranial arthrosclerosis and stenosis, and morbid obesity but no prior stroke history.  Evaluated by therapy who recommended outpatient therapy and discharged home in stable condition.  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   MRI  Acute L paramedian  pontine infarct. 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   MRA  No antegrade flow R VA. Retrograde flow to R PICA. Poor R PCA flow d/t d/t narrow and irregular PCom and severe dz P1 from basilar tip.  CTA head & neck distal R V2 stenosis. Proximal R PCom moderate stenosis. L ICA bifurcation  atherosclerosis. Diffuse goiter. B cervical ICA tortuosity. Dome L VA origin atherosclerosis. Mild distal small vessel disease.  2D Echo EF 60-65%. No source of embolus   Has loop recorder placed 3 yrs ago by Dr Olevia Perches - no AF    LDL 131  HgbA1c 11.4  Heparin 5000 units sq tid for VTE prophylaxis  No antithrombotic prior to admission, now on aspirin 81 mg daily. Add plavix. Continue DAPT x 3 weeks then aspirin alone. Added to d/c orders and AVS  Therapy recommendations:  OP PT  Disposition:  Return home       ROS:   14 system review of systems performed and negative with exception of those listed in HPI  PMH:  Past Medical History:  Diagnosis Date  . Abscess    increased drainage from abscess on buttock  . Anal fistula   . Anxiety   . Bilateral hip pain 05/27/2015  . Chronic pain syndrome 05/27/2015  . Depression    sees [name]  . Diabetes mellitus without complication ([place])   . Hyperlipidemia   . Hypertension   . Sleep apnea    2008- sleep study, neg. for sleep apnea   . Stroke ([place])   . SVT (supraventricular tachycardia) ([place])   . Symptomatic cholelithiasis 09/11/2018    PSH:  Past Surgical History:  Procedure Laterality Date  . ANAL EXAMINATION UNDER ANESTHESIA  02/21/11   anal fistula  . BREAST SURGERY  patient does not remember date of procedure   pull fluid off lft br  . CHOLECYSTECTOMY N/A 09/12/2018   Procedure: LAPAROSCOPIC CHOLECYSTECTOMY;  Surgeon: Stark Klein, MD;  Location: [facility];  Service: General;  Laterality: N/A;  . ELECTROPHYSIOLOGIC STUDY N/A 05/05/2015   Procedure: SVT Ablation;  Surgeon: Will Meredith Leeds, MD;  Location: [facility];  Service: Cardiovascular;  Laterality: N/A;  . EP IMPLANTABLE DEVICE N/A 01/30/2016   Procedure: Loop Recorder Insertion;  Surgeon: Sanda Klein, MD;  Location: [facility];  Service: Cardiovascular;  Laterality: N/A;  . INCISE AND DRAIN ABCESS     abscess on right thigh and buttock  .  KNEE ARTHROSCOPY     left  . LAPAROSCOPIC APPENDECTOMY N/A 09/19/2018   Procedure: APPENDECTOMY LAPAROSCOPIC;  Surgeon: Clovis Riley, MD;  Location: [facility];  Service: General;  Laterality: N/A;  . SHOULDER SURGERY  04/14/09   right    Social History:  Social History   Socioeconomic History  . Marital status: Divorced    Spouse name: Not on file  . Number of children: 0  . Years of education: Not on file  . Highest education level: Not on file  Occupational History  . Occupation: TEACHER ASSISTANT  Tobacco Use  . Smoking status: Never Smoker  . Smokeless tobacco: Never Used  Vaping Use  . Vaping Use: Never used  Substance and Sexual Activity  . Alcohol use: No  . Drug use: No  . Sexual activity: Yes  Other Topics Concern  . Not on file  Social History Narrative  . Not on file   Social Determinants of Health   Financial Resource Strain: Not on file  Food  Insecurity: Food Insecurity Present  . Worried About Charity fundraiser in the Last Year: Sometimes true  . Ran Out of Food in the Last Year: Sometimes true  Transportation Needs: No Transportation Needs  . Lack of Transportation (Medical): No  . Lack of Transportation (Non-Medical): No  Physical Activity: Not on file  Stress: Not on file  Social Connections: Not on file  Intimate Partner Violence: Not on file    Family History:  Family History  Problem Relation Age of Onset  . Hypertension Mother   . Alzheimer's disease Mother   . Diabetes Father   . Breast cancer Sister 59  . Anesthesia problems Neg Hx   . Hypotension Neg Hx   . Malignant hyperthermia Neg Hx   . Pseudochol deficiency Neg Hx   . Colon cancer Neg Hx   . Esophageal cancer Neg Hx   . Stomach cancer Neg Hx   . Rectal cancer Neg Hx   . Thyroid disease Neg Hx     Medications:   Current Outpatient Medications on File Prior to Visit  Medication Sig Dispense Refill  . aspirin 81 MG EC tablet Take 1 tablet (81 mg total) by mouth daily. 90  tablet 3  . carvedilol (COREG) 6.25 MG tablet Take 1 tablet (6.25 mg total) by mouth 2 (two) times daily. 180 tablet 3  . dexlansoprazole (DEXILANT) 60 MG capsule Take 1 capsule (60 mg total) by mouth daily. 90 capsule 3  . diclofenac (VOLTAREN) 50 MG EC tablet Take 1 tablet by mouth twice daily 180 tablet 0  . DULoxetine (CYMBALTA) 60 MG capsule Take 1 capsule by mouth twice daily 180 capsule 0  . Ferrous Gluconate 324 (37.5 Fe) MG TABS TAKE 1 TABLET BY MOUTH TWICE DAILY WITH A MEAL 180 tablet 0  . FIASP FLEXTOUCH 100 UNIT/ML FlexTouch Pen SMARTSIG:30 Unit(s) SUB-Q 3 Times Daily 9 mL 1  . fluconazole (DIFLUCAN) 100 MG tablet Take 1 tablet (100 mg total) by mouth daily. 7 tablet 0  . furosemide (LASIX) 80 MG tablet Take 1 tablet (80 mg total) by mouth daily. 90 tablet 3  . glucose blood test strip ONE TOUCH VERIO TEST STRIPS TO TEST 1-2 TIMES A DAY 100 each 12  . HYDROcodone-acetaminophen (NORCO) 10-325 MG tablet Take 1 tablet by mouth every 6 (six) hours as needed. Please do not  Fill before 03/29/2020 120 tablet 0  . Insulin Glargine (BASAGLAR KWIKPEN) 100 UNIT/ML Inject 100 Units into the skin every morning. And pen needles 1/day 30 mL 1  . nortriptyline (PAMELOR) 25 MG capsule Take 1 capsule by mouth at bedtime 90 capsule 1  . potassium chloride (KLOR-CON) 10 MEQ tablet Take 1 tablet (10 mEq total) by mouth daily. 90 tablet 3  . rosuvastatin (CRESTOR) 20 MG tablet Take 1 tablet (20 mg total) by mouth daily. 90 tablet 3   No current facility-administered medications on file prior to visit.    Allergies:   Allergies  Allergen Reactions  . Ozempic (0.25 Or 0.5 Mg-Dose) [Semaglutide(0.25 Or 0.5mg -Dos)]     Severe nausea, bad gas  . Tramadol Nausea Only      OBJECTIVE:  Physical Exam  Vitals:   03/24/20 1523  BP: 124/78  Pulse: 78  Weight: 272 lb (123.4 kg)  Height: 5\' 3"  (1.6 m)   Body mass index is 48.18 kg/m. No exam data present  General: Morbidly obese very pleasant  middle-age female, seated, in no evident distress Head: head normocephalic and atraumatic.  Neck: supple with no carotid or supraclavicular bruits Cardiovascular: regular rate and rhythm, no murmurs Musculoskeletal: no deformity Skin:  no rash/petichiae Vascular:  Normal pulses all extremities   Neurologic Exam Mental Status: Awake and fully alert.  Fluent speech and language. Oriented to place and time. Recent memory subjectively impaired and remote memory intact. Attention span, concentration and fund of knowledge appropriate during visit. Mood and affect appropriate.  Cranial Nerves: Fundoscopic exam reveals sharp disc margins. Pupils equal, briskly reactive to light. Extraocular movements full without nystagmus. Visual fields full to confrontation. Hearing intact. Facial sensation intact. Face, tongue, palate moves normally and symmetrically.  Motor: Normal bulk and tone. Normal strength in all tested extremity muscles except slightly decreased right hand dexterity and slight decreased grip strength. Sensory.: Intact light touch, vibratory and pinprick sensation throughout all tested extremities. Subjective RUE sandpaper sensation with light touch Coordination: Rapid alternating movements normal in all extremities except slightly decreased right hand. Finger-to-nose and heel-to-shin performed accurately bilaterally.  Gait and Station: Arises from chair without difficulty. Stance is normal. Gait demonstrates normal stride length and balance without use of assistive device Reflexes: 1+ and symmetric. Toes downgoing.        ASSESSMENT: [name] is a 60 y.o. 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 Vascular risk factors include BPPV, HTN, HLD, uncontrolled DM, diffuse intracranial  arthrosclerosis and stenosis and morbid obesity.      PLAN:  1. Left paramedial pontine stroke:  a. Residual deficits: RUE numbness and slightly decreased hand dexterity. Overall great improvement b. Continue aspirin 81 mg daily  and Crestor 20 mg daily for secondary stroke prevention.  c. Discussed secondary stroke prevention measures and importance of close PCP follow-up for aggressive stroke risk factor management 2. HTN: BP goal<130/90.  Stable on carvedilol, and furosemide per PCP 3. HLD: LDL goal<70.  Recent LDL 122 (02/29/2020) therefore discontinued atorvastatin and initiated Crestor 20 mg daily per PCP 4. DMII: A1c goal<7.0.  Recent A1c 10.9 (02/29/2020) on insulin glargine currently in the process of switching endocrinologist   Follow up in 6 months or call earlier if needed   CC:  [place] provider: Dr. Thomasene Ripple, Camelia Eng, PA-C    I spent 30 minutes of face-to-face and non-face-to-face time with patient.  This included previsit chart review, lab review, study review, order entry, electronic health record documentation, patient education regarding prior stroke, residual deficits, importance of managing stroke risk factors and answered all other questions to patient satisfaction   Frann Rider, [facility]  [facility] [address] [place], [place] [address]  Phone [phone] Fax [phone] Note: This document was prepared with digital dictation and possible smart phrase technology. Any transcriptional errors that result from this process are unintentional.

## 2020-03-24 NOTE — Patient Instructions (Signed)
Continue aspirin 81 mg daily  and Crestor for secondary stroke prevention  Continue to follow up with PCP regarding cholesterol, blood pressure and diabetes management  Maintain strict control of hypertension with blood pressure goal below 130/90, diabetes with hemoglobin A1c goal below 7.0 % and cholesterol with LDL cholesterol (bad cholesterol) goal below 70 mg/dL.       Followup in the future with me in 6 months or call earlier if needed       Thank you for coming to see [place] at [facility]. I hope we have been able to provide you high quality care today.  You may receive a patient satisfaction survey over the next few weeks. We would appreciate your feedback and comments so that we may continue to improve ourselves and the health of our patients.   Sensory Loss After a Stroke A stroke can damage parts of your brain that control your body's normal functions, including your senses. As a result, you may have sensory loss, such as trouble seeing, tasting, swallowing, or feeling touch or pressure sensations. You may have problems feeling temperature changes or moving your body in a coordinated way. You may also perceive smell differently. You may have problems with all of your senses or only some of them. By following a treatment plan, you may recover lost senses and manage the changes to your lifestyle. What are treatment therapies for sensory loss? You may have a combination of therapies for sensory loss.  Physical therapy. This may include: ? Exercises to improve your coordination and balance. ? Exercises that combine touch, balance, and movement (sensorimotor training). ? Movements to relieve pressure while you are sitting or lying (mobility training). ? Splints or braces to protect parts of your body that you cannot feel.  Devices to help with blood flow (circulation) and to help stimulate nerves in affected parts of your body.  Speech therapy to help you  swallow safely.  Occupational therapy to help you with everyday tasks. This may include: ? Exercises or devices to improve your vision. ? Exercises to help you increase your touch perception. These may include feeling objects of different sizes and textures while your eyes are closed. ? Techniques for moving safely in your environment.  Prescription eye glasses for vision loss.  Hearing aids for hearing loss. Follow these instructions at home: Safety  Your risk of falling is higher after a stroke. You may have difficulty feeling your legs and feet or coordinating your movements. To lower your risk of falling: ? Use devices to help you move around (assistive devices), such as a wheelchair or walker, as directed by your health care team. ? Wear prescription eye glasses at all times when moving around. ? Use lights to help you see in the dark. ? Use grab bars in bathrooms and handrails in stairways. ? Keep walkways clear in your home by removing rugs, cords, and clutter from the floor.  Your risk of getting burned is higher after a stroke. To lower your risk of burns: ? Test the water temperature before taking a bath or washing your hands. ? Allow hot foods to cool slightly before eating. ? Use potholders when handling hot pans.  When using sharp objects, such as scissors or knives, use your healthy hand. Do not handle sharp objects with your hand that was affected by your stroke, if this applies.   Activity  Return to your normal activities as told by your health care provider. Ask your health care  provider what activities are safe for you.  Avoid spending too much time sitting or lying down. If you must be in a chair or bed, change positions regularly.  You may have problems doing your normal activities. Ask your health care provider about getting extra help at home. Eating and drinking  You may have problems swallowing food and fluids after a stroke. The problems can be due  to: ? Changes in your muscles. ? Sensory changes, such as:  Difficulty feeling the consistency or size of a piece of food in your mouth.  Inability to feel the need to clear your throat.  You may need to: ? Take smaller bites and chew thoroughly. Make sure you have swallowed all the food in your mouth before you take another bite. ? Sit in an upright position when eating or drinking. ? Avoid distractions while eating or drinking. ? Stay upright for 30-45 minutes after eating. ? Change the texture of some things that you eat and drink. This may include:  Changing foods to a smooth, mashed consistency (puree).  Thickening liquids.  Follow instructions from your health care provider about eating and drinking restrictions.   General instructions  Wear arm or leg braces as told by your health care team.  Get help at home as needed. You may need help getting dressed, bathing, using the bathroom, eating, or doing other activities.  Keep all follow-up visits as told by your health care providers. This is important. Summary  It is common to have sensory loss after a stroke. Sensory loss means that you have problems with some or all of your senses, such as vision, taste, hearing, smell, and touch.  Sensory loss happens because of damage to your brain and nervous system after a stroke.  Treatment for sensory loss may include physical, occupational, or speech therapy, and the use of assistive devices.  You may need to make changes to your home and lifestyle after a stroke to help you live safely and independently. This information is not intended to replace advice given to you by your health care provider. Make sure you discuss any questions you have with your health care provider. Document Revised: 06/20/2018 Document Reviewed: 06/15/2016 Elsevier Patient Education  [place].

## 2020-03-25 NOTE — Telephone Encounter (Signed)
See fax coversheet and appeal memo to fax to [employer]

## 2020-03-26 NOTE — Progress Notes (Signed)
I agree with the above plan 

## 2020-04-03 ENCOUNTER — Other Ambulatory Visit: Payer: Self-pay | Admitting: Medical

## 2020-04-04 ENCOUNTER — Telehealth: Payer: Self-pay | Admitting: Medical

## 2020-04-04 NOTE — Telephone Encounter (Signed)
You had a pulmonary nodule on a scan a few years ago.  This may not be able to show up on a chest x-ray.  Your last chest x-ray was February 2021.  At this point if insurance will not approve a CT scan we can refer to pulmonology as they may have more leverage in getting this covered

## 2020-04-04 NOTE — Telephone Encounter (Signed)
Sent message to patient via mychart to see if the wants to move forward with referral.

## 2020-04-05 ENCOUNTER — Other Ambulatory Visit: Payer: Self-pay

## 2020-04-05 DIAGNOSIS — Z87898 Personal history of other specified conditions: Secondary | ICD-10-CM

## 2020-04-08 ENCOUNTER — Encounter (HOSPITAL_BASED_OUTPATIENT_CLINIC_OR_DEPARTMENT_OTHER): Payer: BC Managed Care – PPO | Attending: Internal Medicine | Admitting: Internal Medicine

## 2020-04-08 ENCOUNTER — Other Ambulatory Visit: Payer: Self-pay

## 2020-04-08 DIAGNOSIS — E1165 Type 2 diabetes mellitus with hyperglycemia: Secondary | ICD-10-CM | POA: Insufficient documentation

## 2020-04-08 DIAGNOSIS — L732 Hidradenitis suppurativa: Secondary | ICD-10-CM | POA: Insufficient documentation

## 2020-04-08 DIAGNOSIS — I10 Essential (primary) hypertension: Secondary | ICD-10-CM | POA: Insufficient documentation

## 2020-04-08 DIAGNOSIS — L98498 Non-pressure chronic ulcer of skin of other sites with other specified severity: Secondary | ICD-10-CM | POA: Insufficient documentation

## 2020-04-08 DIAGNOSIS — Z8673 Personal history of transient ischemic attack (TIA), and cerebral infarction without residual deficits: Secondary | ICD-10-CM | POA: Insufficient documentation

## 2020-04-08 NOTE — Progress Notes (Addendum)
WILLINE, SPRAGG ([id number]) Visit Report for 04/08/2020 Allergy List Details Patient Name: Date of Service: [name] NDSmitty [name] 04/08/2020 1:15 PM Medical Record Number: [id number] Patient Account Number: [id number] Date of Birth/Sex: Treating RN: [birth year] (60 y.o. Female) [name] Primary Care Kolbe Delmonaco: [name] Other Clinician: Referring Lourdez Mcgahan: Treating Franciscojavier Wronski/Extender: [name] in Treatment: 0 Allergies Active Allergies tramadol Reaction: nausea Severity: Moderate Type: Food Ozempic Reaction: nausea Severity: Moderate Type: Medication Allergy Notes Electronic Signature(s) Signed: 04/08/2020 5:52:57 PM By: [name] RN, BSN Entered By: [name] on 04/08/2020 14:01:55 -------------------------------------------------------------------------------- Arrival Information Details Patient Name: Date of Service: [name] ND, [name] 04/08/2020 1:15 PM Medical Record Number: [id number] Patient Account Number: [id number] Date of Birth/Sex: Treating RN: October 25, 1960 (60 y.o. Female) [name] Primary Care Mordechai Matuszak: [name] Other Clinician: Referring Azion Centrella: Treating Satomi Buda/Extender: [name] in Treatment: 0 Visit Information Patient Arrived: Ambulatory Arrival Time: 14:00 Accompanied By: alone Transfer Assistance: None Patient Identification Verified: Yes Secondary Verification Process Completed: Yes Patient Requires Transmission-Based Precautions: No Patient Has Alerts: No Electronic Signature(s) Signed: 04/08/2020 5:52:57 PM By: [name] RN, BSN Entered By: [name] on 04/08/2020 14:00:32 -------------------------------------------------------------------------------- Clinic Level of Care Assessment Details Patient Name: Date of Service: [name] ND, [name] 04/08/2020 1:15 PM Medical Record Number: [id number] Patient Account Number: [id number] Date of  Birth/Sex: Treating RN: [birth year] (60 y.o. Female) Baruch Gouty Primary Care Yukari Flax: [name] Other Clinician: Referring Tali Coster: Treating Latoya Maulding/Extender: [name] in Treatment: 0 Clinic Level of Care Assessment Items TOOL 2 Quantity Score []  - 0 Use when only an EandM is performed on the INITIAL visit ASSESSMENTS - Nursing Assessment / Reassessment X- 1 20 General Physical Exam (combine w/ comprehensive assessment (listed just below) when performed on new pt. evals) X- 1 25 Comprehensive Assessment (HX, ROS, Risk Assessments, Wounds Hx, etc.) ASSESSMENTS - Wound and Skin A ssessment / Reassessment []  - 0 Simple Wound Assessment / Reassessment - one wound []  - 0 Complex Wound Assessment / Reassessment - multiple wounds []  - 0 Dermatologic / Skin Assessment (not related to wound area) ASSESSMENTS - Ostomy and/or Continence Assessment and Care []  - 0 Incontinence Assessment and Management []  - 0 Ostomy Care Assessment and Management (repouching, etc.) PROCESS - Coordination of Care X - Simple Patient / Family Education for ongoing care 1 15 []  - 0 Complex (extensive) Patient / Family Education for ongoing care X- 1 10 Staff obtains Programmer, systems, Records, T Results / Process Orders est []  - 0 Staff telephones HHA, Nursing Homes / Clarify orders / etc []  - 0 Routine Transfer to another Facility (non-emergent condition) []  - 0 Routine Hospital Admission (non-emergent condition) X- 1 15 New Admissions / Biomedical engineer / Ordering NPWT Apligraf, etc. , []  - 0 Emergency Hospital Admission (emergent condition) X- 1 10 Simple Discharge Coordination []  - 0 Complex (extensive) Discharge Coordination PROCESS - Special Needs []  - 0 Pediatric / Minor Patient Management []  - 0 Isolation Patient Management []  - 0 Hearing / Language / Visual special needs []  - 0 Assessment of Community assistance (transportation, D/C planning,  etc.) []  - 0 Additional assistance / Altered mentation []  - 0 Support Surface(s) Assessment (bed, cushion, seat, etc.) INTERVENTIONS - Wound Cleansing / Measurement X- 1 5 Wound Imaging (photographs - any number of wounds) []  - 0 Wound Tracing (instead of photographs) []  - 0 Simple Wound Measurement - one wound X- 3 5 Complex Wound Measurement - multiple wounds []  - 0  Simple Wound Cleansing - one wound X- 3 5 Complex Wound Cleansing - multiple wounds INTERVENTIONS - Wound Dressings X - Small Wound Dressing one or multiple wounds 2 10 []  - 0 Medium Wound Dressing one or multiple wounds []  - 0 Large Wound Dressing one or multiple wounds []  - 0 Application of Medications - injection INTERVENTIONS - Miscellaneous []  - 0 External ear exam []  - 0 Specimen Collection (cultures, biopsies, blood, body fluids, etc.) []  - 0 Specimen(s) / Culture(s) sent or taken to Lab for analysis []  - 0 Patient Transfer (multiple staff / Harrel Lemon Lift / Similar devices) []  - 0 Simple Staple / Suture removal (25 or less) []  - 0 Complex Staple / Suture removal (26 or more) []  - 0 Hypo / Hyperglycemic Management (close monitor of Blood Glucose) []  - 0 Ankle / Brachial Index (ABI) - do not check if billed separately Has the patient been seen at the hospital within the last three years: Yes Total Score: 150 Level Of Care: New/Established - Level 4 Electronic Signature(s) Signed: 04/08/2020 5:52:16 PM By: Baruch Gouty RN, BSN Entered By: Baruch Gouty on 04/08/2020 14:53:48 -------------------------------------------------------------------------------- Encounter Discharge Information Details Patient Name: Date of Service: [name] 04/08/2020 1:15 PM Medical Record Number: [id number] Patient Account Number: [id number] Date of Birth/Sex: Treating RN: [birth year] (60 y.o. Female) [name] Primary Care [name]: [name] Other Clinician: Referring Michiah Masse: Treating  [name]/Extender: [name] in Treatment: 0 Encounter Discharge Information Items Post Procedure Vitals Discharge Condition: Stable Temperature (F): 98.6 Ambulatory Status: Ambulatory Pulse (bpm): 85 Discharge Destination: Home Respiratory Rate (breaths/min): 18 Transportation: Private Auto Blood Pressure (mmHg): 153/80 Accompanied By: self Schedule Follow-up Appointment: Yes Clinical Summary of Care: Electronic Signature(s) Signed: 04/08/2020 5:42:38 PM By: [name] Entered By: [name] on 04/08/2020 16:35:35 -------------------------------------------------------------------------------- Multi Wound Chart Details Patient Name: Date of Service: [name] ND, [place] 04/08/2020 1:15 PM Medical Record Number: [id number] Patient Account Number: [id number] Date of Birth/Sex: Treating RN: 03/28/1960 (60 y.o. Female) Baruch Gouty Primary Care Kian Gamarra: [name] Other Clinician: Referring La Dibella: Treating Jensyn Shave/Extender: [name] in Treatment: 0 Vital Signs Height(in): 39 Capillary Blood Glucose(mg/dl): 123 Weight(lbs): 272 Pulse(bpm): 17 Body Mass Index(BMI): 32 Blood Pressure(mmHg): 153/80 Temperature(F): 98.6 Respiratory Rate(breaths/min): 18 Photos: [1:No Photos Right Groin] [2:No Photos Left, Proximal Groin] [3:No Photos Left, Distal Groin] Wound Location: [1:Gradually Appeared] [2:Gradually Appeared] [3:Gradually Appeared] Wounding Event: [1:Atypical] [2:Hidradenitis] [3:Hidradenitis] Primary Etiology: [1:Hypertension, Type II Diabetes,] [2:Hypertension, Type II Diabetes,] [3:Hypertension, Type II Diabetes,] Comorbid History: [1:Osteoarthritis 03/13/2015] [2:Osteoarthritis 03/13/2015] [3:Osteoarthritis 03/13/2015] Date Acquired: [1:0] [2:0] [3:0] Weeks of Treatment: [1:Open] [2:Open] [3:Open] Wound Status: [1:9.5x0.4x0.1] [2:1.2x0.5x0.4] [3:1.7x0.6x0.5] Measurements L x W x D (cm) [1:2.985]  [2:0.471] [3:0.801] A (cm) : rea [1:0.298] [2:0.188] [3:0.401] Volume (cm) : [1:Full Thickness Without Exposed] [2:Full Thickness Without Exposed] [3:Full Thickness Without Exposed] Classification: [1:Support Structures Medium] [2:Support Structures Medium] [3:Support Structures Medium] Exudate A mount: [1:Serosanguineous] [2:Serosanguineous] [3:Serosanguineous] Exudate Type: [1:red, brown] [2:red, brown] [3:red, brown] Exudate Color: [1:Flat and Intact] [2:Flat and Intact] [3:Flat and Intact] Wound Margin: [1:Large (67-100%)] [2:Large (67-100%)] [3:Large (67-100%)] Granulation A mount: [1:Pink] [2:Pink] [3:Pink] Granulation Quality: [1:None Present (0%)] [2:None Present (0%)] [3:None Present (0%)] Necrotic A mount: [1:Fat Layer (Subcutaneous Tissue): Yes Fat Layer (Subcutaneous Tissue): Yes Fat Layer (Subcutaneous Tissue): Yes] Exposed Structures: [1:Fascia: No Tendon: No Muscle: No Joint: No Bone: No None] [2:Fascia: No Tendon: No Muscle: No Joint: No Bone: No None] [3:Fascia: No Tendon: No Muscle: No Joint: No Bone:  No None] Epithelialization: [1:Chemical/Enzymatic/Mechanical] [2:Chemical/Enzymatic/Mechanical] [3:Chemical/Enzymatic/Mechanical] Debridement: [1:N/A] [2:N/A] [3:N/A] Instrument: [1:None] [2:None] [3:None] Bleeding: Debridement Treatment Response: Procedure was tolerated well [2:Procedure was tolerated well] [3:Procedure was tolerated well] Post Debridement Measurements L x 9.5x0.4x0.1 [2:1.2x0.5x0.4] [3:1.7x0.6x0.5] W x D (cm) [1:0.298] [2:0.188] [3:0.401] Post Debridement Volume: (cm) [1:Debridement] [2:Debridement] [3:Debridement] Treatment Notes Electronic Signature(s) Signed: 04/08/2020 5:33:44 PM By: Baltazar Najjar MD Signed: 04/08/2020 5:52:16 PM By: Zenaida Deed RN, BSN Entered By: Baltazar Najjar on 04/08/2020 14:59:49 -------------------------------------------------------------------------------- Multi-Disciplinary Care Plan Details Patient Name: Date of  Service: [facility] [place], JUA NITA 04/08/2020 1:15 PM Medical Record Number: [id number] Patient Account Number: [id number] Date of Birth/Sex: Treating RN: [birth year] (60 y.o. Female) Zenaida Deed Primary Care Jelesa Mangini: Crosby Oyster Other Clinician: Referring [place]: Treating Gardner Servantes/Extender: Caroleen Hamman in Treatment: 0 Active Inactive Nutrition Nursing Diagnoses: Impaired glucose control: actual or potential Potential for alteratiion in Nutrition/Potential for imbalanced nutrition Goals: Patient/caregiver will maintain therapeutic glucose control Date Initiated: 04/08/2020 Target Resolution Date: 05/06/2020 Goal Status: Active Interventions: Assess patient nutrition upon admission and as needed per policy Provide education on elevated blood sugars and impact on wound healing Treatment Activities: Patient referred to Primary Care Physician for further nutritional evaluation : 04/08/2020 Notes: Wound/Skin Impairment Nursing Diagnoses: Impaired tissue integrity Knowledge deficit related to ulceration/compromised skin integrity Goals: Patient/caregiver will verbalize understanding of skin care regimen Date Initiated: 04/08/2020 Target Resolution Date: 05/06/2020 Goal Status: Active Ulcer/skin breakdown will have a volume reduction of 30% by week 4 Date Initiated: 04/08/2020 Target Resolution Date: 05/06/2020 Goal Status: Active Interventions: Assess patient/caregiver ability to obtain necessary supplies Assess patient/caregiver ability to perform ulcer/skin care regimen upon admission and as needed Assess ulceration(s) every visit Provide education on ulcer and skin care Treatment Activities: Skin care regimen initiated : 04/08/2020 Topical wound management initiated : 04/08/2020 Notes: Electronic Signature(s) Signed: 04/08/2020 5:52:16 PM By: Zenaida Deed RN, BSN Entered By: Zenaida Deed on 04/08/2020  14:51:56 -------------------------------------------------------------------------------- Pain Assessment Details Patient Name: Date of Service: Bertram Millard ND, JUA NITA 04/08/2020 1:15 PM Medical Record Number: [id number] Patient Account Number: [id number] Date of Birth/Sex: Treating RN: Sep 16, 1960 (60 y.o. Female) Zandra Abts Primary Care Nichole Keltner: Crosby Oyster Other Clinician: Referring Onyekachi Gathright: Treating Markeisha Mancias/Extender: Caroleen Hamman in Treatment: 0 Active Problems Location of Pain Severity and Description of Pain Patient Has Paino No Site Locations Pain Management and Medication Current Pain Management: Electronic Signature(s) Signed: 04/08/2020 5:52:57 PM By: Zandra Abts RN, BSN Entered By: Zandra Abts on 04/08/2020 14:28:21 -------------------------------------------------------------------------------- Patient/Caregiver Education Details Patient Name: Date of Service: [facility] ND, JUA NITA 1/28/2022andnbsp1:15 PM Medical Record Number: [id number] Patient Account Number: [id number] Date of Birth/Gender: Treating RN: May 12, 1960 (60 y.o. Female) Zenaida Deed Primary Care Physician: Crosby Oyster Other Clinician: Referring Physician: Treating Physician/Extender: Caroleen Hamman in Treatment: 0 Education Assessment Education Provided To: Patient Education Topics Provided Elevated Blood Sugar/ Impact on Healing: Handouts: Elevated Blood Sugars: How Do They Affect Wound Healing Methods: Explain/Verbal, Printed Responses: Reinforcements needed, State content correctly [facility]: o Handouts: [facility] o Methods: Explain/Verbal, Printed Responses: Reinforcements needed, State content correctly Wound/Skin Impairment: Handouts: Caring for Your Ulcer, Skin Care Do's and Dont's Methods: Explain/Verbal, Printed Responses: Reinforcements needed, State content  correctly Electronic Signature(s) Signed: 04/08/2020 5:52:16 PM By: Zenaida Deed RN, BSN Entered By: Zenaida Deed on 04/08/2020 14:52:51 -------------------------------------------------------------------------------- Wound Assessment Details Patient Name: Date of Service: [facility] ND, JUA NITA 04/08/2020 1:15 PM Medical Record Number: [id number] Patient Account Number: [id number]  Date of Birth/Sex: Treating RN: October 26, 1960 (60 y.o. Female) [name] Primary Care Franklin Baumbach: [name] Other Clinician: Referring Sherill Mangen: Treating Karol Liendo/Extender: [name] in Treatment: 0 Wound Status Wound Number: 1 Primary Etiology: Hidradenitis Wound Location: Right Groin Wound Status: Open Wounding Event: Gradually Appeared Comorbid History: Hypertension, Type II Diabetes, Osteoarthritis Date Acquired: 03/13/2015 Weeks Of Treatment: 0 Clustered Wound: No Photos Wound Measurements Length: (cm) 9.5 Width: (cm) 0.4 Depth: (cm) 0.1 Area: (cm) 2.985 Volume: (cm) 0.298 % Reduction in Area: 0% % Reduction in Volume: 0% Epithelialization: None Tunneling: No Undermining: No Wound Description Classification: Full Thickness Without Exposed Support Structures Wound Margin: Flat and Intact Exudate Amount: Medium Exudate Type: Serosanguineous Exudate Color: red, brown Foul Odor After Cleansing: No Slough/Fibrino No Wound Bed Granulation Amount: Large (67-100%) Exposed Structure Granulation Quality: Pink Fascia Exposed: No Necrotic Amount: None Present (0%) Fat Layer (Subcutaneous Tissue) Exposed: Yes Tendon Exposed: No Muscle Exposed: No Joint Exposed: No Bone Exposed: No Treatment Notes Wound #1 (Groin) Wound Laterality: Right Cleanser Peri-Wound Care Topical Primary Dressing KerraCel Ag Gelling Fiber Dressing, 4x5 in (silver alginate) Discharge Instruction: Apply silver alginate to wound bed as instructed Secondary Dressing Woven  Gauze Sponge, Non-Sterile 4x4 in Discharge Instruction: Apply over primary dressing as directed. Secured With 42M [place] [facility] T ape, 2x2 (in/yd) Discharge Instruction: Secure dressing with tape as directed. Compression Wrap Compression Stockings Add-Ons Electronic Signature(s) Signed: 04/14/2020 3:40:44 PM By: Mikeal Hawthorne EMT/HBOT/SD Signed: 04/19/2020 5:05:26 PM By: [name] RN, BSN Previous Signature: 04/08/2020 5:52:57 PM Version By: [name] RN, BSN Entered By: Mikeal Hawthorne on 04/14/2020 11:21:25 -------------------------------------------------------------------------------- Wound Assessment Details Patient Name: Date of Service: [place], [place] 04/08/2020 1:15 PM Medical Record Number: [id number] Patient Account Number: [id number] Date of Birth/Sex: Treating RN: [birth year] (60 y.o. Female) [name] Primary Care Estanislado Surgeon: [name] Other Clinician: Referring Hao Dion: Treating Hala Narula/Extender: [name] in Treatment: 0 Wound Status Wound Number: 2 Primary Etiology: Hidradenitis Wound Location: Left, Proximal Groin Wound Status: Open Wounding Event: Gradually Appeared Comorbid History: Hypertension, Type II Diabetes, Osteoarthritis Date Acquired: 03/13/2015 Weeks Of Treatment: 0 Clustered Wound: No Photos Wound Measurements Length: (cm) 1.2 Width: (cm) 0.5 Depth: (cm) 0.4 Area: (cm) 0.471 Volume: (cm) 0.188 % Reduction in Area: 0% % Reduction in Volume: 0% Epithelialization: None Tunneling: No Undermining: No Wound Description Classification: Full Thickness Without Exposed Support Structures Wound Margin: Flat and Intact Exudate Amount: Medium Exudate Type: Serosanguineous Exudate Color: red, brown Foul Odor After Cleansing: No Slough/Fibrino No Wound Bed Granulation Amount: Large (67-100%) Exposed Structure Granulation Quality: Pink Fascia Exposed: No Necrotic Amount: None  Present (0%) Fat Layer (Subcutaneous Tissue) Exposed: Yes Tendon Exposed: No Muscle Exposed: No Joint Exposed: No Bone Exposed: No Treatment Notes Wound #2 (Groin) Wound Laterality: Left, Proximal Cleanser Peri-Wound Care Topical Primary Dressing KerraCel Ag Gelling Fiber Dressing, 4x5 in (silver alginate) Discharge Instruction: Apply silver alginate to wound bed as instructed Secondary Dressing Woven Gauze Sponge, Non-Sterile 4x4 in Discharge Instruction: Apply over primary dressing as directed. Secured With 42M [place] [facility] T ape, 2x2 (in/yd) Discharge Instruction: Secure dressing with tape as directed. Compression Wrap Compression Stockings Add-Ons Electronic Signature(s) Signed: 04/14/2020 3:40:44 PM By: Mikeal Hawthorne EMT/HBOT/SD Signed: 04/19/2020 5:05:26 PM By: [name] RN, BSN Previous Signature: 04/08/2020 5:52:57 PM Version By: [name] RN, BSN Entered By: Mikeal Hawthorne on 04/14/2020 11:20:47 -------------------------------------------------------------------------------- Wound Assessment Details Patient Name: Date of Service: [place], [place] 04/08/2020  1:15 PM Medical Record Number: [id number] Patient Account Number: [id number] Date of Birth/Sex: Treating RN: Oct 16, 1960 (60 y.o. Female) [name] Primary Care Shamarion Coots: [name] Other Clinician: Referring Valin Massie: Treating Bach Rocchi/Extender: [name] in Treatment: 0 Wound Status Wound Number: 3 Primary Etiology: Hidradenitis Wound Location: Left, Distal Groin Wound Status: Open Wounding Event: Gradually Appeared Comorbid History: Hypertension, Type II Diabetes, Osteoarthritis Date Acquired: 03/13/2015 Weeks Of Treatment: 0 Clustered Wound: No Photos Wound Measurements Length: (cm) 1.7 Width: (cm) 0.6 Depth: (cm) 0.5 Area: (cm) 0.801 Volume: (cm) 0.401 % Reduction in Area: 0% % Reduction in Volume: 0% Epithelialization:  None Tunneling: No Undermining: No Wound Description Classification: Full Thickness Without Exposed Support Structu Wound Margin: Flat and Intact Exudate Amount: Medium Exudate Type: Serosanguineous Exudate Color: red, brown res Foul Odor After Cleansing: No Slough/Fibrino No Wound Bed Granulation Amount: Large (67-100%) Exposed Structure Granulation Quality: Pink Fascia Exposed: No Necrotic Amount: None Present (0%) Fat Layer (Subcutaneous Tissue) Exposed: Yes Tendon Exposed: No Muscle Exposed: No Joint Exposed: No Bone Exposed: No Treatment Notes Wound #3 (Groin) Wound Laterality: Left, Distal Cleanser Peri-Wound Care Topical Primary Dressing KerraCel Ag Gelling Fiber Dressing, 4x5 in (silver alginate) Discharge Instruction: Apply silver alginate to wound bed as instructed Secondary Dressing Woven Gauze Sponge, Non-Sterile 4x4 in Discharge Instruction: Apply over primary dressing as directed. Secured With 83M [place] [facility] T ape, 2x2 (in/yd) Discharge Instruction: Secure dressing with tape as directed. Compression Wrap Compression Stockings Add-Ons Electronic Signature(s) Signed: 04/14/2020 3:40:44 PM By: Mikeal Hawthorne EMT/HBOT/SD Signed: 04/19/2020 5:05:26 PM By: [name] RN, BSN Previous Signature: 04/08/2020 5:52:57 PM Version By: [name] RN, BSN Entered By: Mikeal Hawthorne on 04/14/2020 11:21:05 -------------------------------------------------------------------------------- Vitals Details Patient Name: Date of Service: [place], [place] 04/08/2020 1:15 PM Medical Record Number: [id number] Patient Account Number: [id number] Date of Birth/Sex: Treating RN: 30-Nov-1960 (60 y.o. Female) [name] Primary Care Jaskarn Schweer: [name] Other Clinician: Referring Yazmina Pareja: Treating Hagan Vanauken/Extender: [name] in Treatment: 0 Vital Signs Time Taken: 14:00 Temperature (F): 98.6 Height (in): 63 Pulse  (bpm): 85 Source: Stated Respiratory Rate (breaths/min): 18 Weight (lbs): 272 Blood Pressure (mmHg): 153/80 Source: Stated Capillary Blood Glucose (mg/dl): 123 Body Mass Index (BMI): 48.2 Reference Range: 80 - 120 mg / dl Notes glucose per pt report Electronic Signature(s) Signed: 04/08/2020 5:52:57 PM By: [name] RN, BSN Entered By: [name] on 04/08/2020 14:01:10

## 2020-04-08 NOTE — Progress Notes (Signed)
[name] ([id number]) Visit Report for 04/08/2020 Abuse/Suicide Risk Screen Details Patient Name: Date of Service: [name] 04/08/2020 1:15 PM Medical Record Number: [id number] Patient Account Number: [id number] Date of Birth/Sex: Treating RN: August 24, 1960 (60 y.o. [name] Primary Care [name]: [name] Other Clinician: Referring [name]: Treating [name]/Extender: [name] in Treatment: 0 Abuse/Suicide Risk Screen Items Answer ABUSE RISK SCREEN: Has anyone close to you tried to hurt or harm you recentlyo No Do you feel uncomfortable with anyone in your familyo No Has anyone forced you do things that you didnt want to doo No Electronic Signature(s) Signed: 04/08/2020 5:52:57 PM By: Theresa Hurst RN, BSN Entered By: [name] on 04/08/2020 14:23:18 -------------------------------------------------------------------------------- Activities of Daily Living Details Patient Name: Date of Service: [name] 04/08/2020 1:15 PM Medical Record Number: [id number] Patient Account Number: [id number] Date of Birth/Sex: Treating RN: 1960/08/25 (60 y.o. [name] Primary Care [name]: [name] Other Clinician: Referring [name]: Treating [name]/Extender: [name] in Treatment: 0 Activities of Daily Living Items Answer Activities of Daily Living (Please select one for each item) Drive Automobile Completely Able T Medications ake Completely Able Use T elephone Completely Able Care for Appearance Completely Able Use T oilet Completely Able Bath / Shower Completely Able Dress Self Completely Able Feed Self Completely Able Walk Completely Able Get In / Out Bed Completely Able Housework Completely Able Prepare Meals Completely Volta for Self Completely Able Electronic Signature(s) Signed: 04/08/2020 5:52:57 PM By: Theresa Hurst RN, BSN Entered By: [name] on 04/08/2020 14:23:38 -------------------------------------------------------------------------------- Education Screening Details Patient Name: Date of Service: [name], [name] 04/08/2020 1:15 PM Medical Record Number: [id number] Patient Account Number: [id number] Date of Birth/Sex: Treating RN: 12-Nov-1960 (60 y.o. [name] Primary Care [name]: [name] Other Clinician: Referring [name]: Treating [name]/Extender: [name] in Treatment: 0 Primary Learner Assessed: Patient Learning Preferences/Education Level/Primary Language Learning Preference: Explanation, Demonstration, Printed Material Highest Education Level: College or Above Preferred Language: English Cognitive Barrier Language Barrier: No Translator Needed: No Memory Deficit: No Emotional Barrier: No Cultural/Religious Beliefs Affecting Medical Care: No Physical Barrier Impaired Vision: No Impaired Hearing: No Decreased Hand dexterity: No Knowledge/Comprehension Knowledge Level: High Comprehension Level: High Ability to understand written instructions: High Ability to understand verbal instructions: High Motivation Anxiety Level: Calm Cooperation: Cooperative Education Importance: Acknowledges Need Interest in Health Problems: Asks Questions Perception: Coherent Willingness to Engage in Self-Management High Activities: Readiness to Engage in Self-Management High Activities: Electronic Signature(s) Signed: 04/08/2020 5:52:57 PM By: Theresa Hurst RN, BSN Entered By: [name] on 04/08/2020 14:24:02 -------------------------------------------------------------------------------- Fall Risk Assessment Details Patient Name: Date of Service: [name], [name] 04/08/2020 1:15 PM Medical Record Number: [id number] Patient Account Number: [id number] Date of Birth/Sex: Treating RN: 1961/01/08 (60 y.o. [name] Primary Care [name]: [name] Other Clinician: Referring [name]: Treating [name]/Extender: [name] in Treatment: 0 Fall Risk Assessment Items Have you had 2 or more falls in the last 12 monthso 0 No Have you had any fall that resulted in injury in the last 12 monthso 0 No FALLS RISK SCREEN History of falling - immediate or within 3 months 0 No Secondary diagnosis (Do you have 2 or more medical diagnoseso) 15 Yes Ambulatory aid None/bed rest/wheelchair/nurse 0 Yes Crutches/cane/walker 0 No Furniture 0 No Intravenous therapy Access/Saline/Heparin Lock 0 No Gait/Transferring Normal/ bed rest/ wheelchair 0 Yes Weak (short steps with  or without shuffle, stooped but able to lift head while walking, may seek 0 No support from furniture) Impaired (short steps with shuffle, may have difficulty arising from chair, head down, impaired 0 No balance) Mental Status Oriented to own ability 0 Yes Electronic Signature(s) Signed: 04/08/2020 5:52:57 PM By: Theresa Hurst RN, BSN Entered By: [name] on 04/08/2020 14:24:51 -------------------------------------------------------------------------------- Nutrition Risk Screening Details Patient Name: Date of Service: [name], [name] 04/08/2020 1:15 PM Medical Record Number: [id number] Patient Account Number: [id number] Date of Birth/Sex: Treating RN: May 08, 1960 (60 y.o. [name] Primary Care [name]: [name] Other Clinician: Referring [name]: Treating [name]/Extender: [name] in Treatment: 0 Height (in): 63 Weight (lbs): 272 Body Mass Index (BMI): 48.2 Nutrition Risk Screening Items Score Screening NUTRITION RISK SCREEN: I have an illness or condition that made me change the kind and/or amount of food I eat 2 Yes I eat fewer than two meals per day 0 No I eat few fruits and vegetables, or milk products 0 No I have three or more  drinks of beer, liquor or wine almost every day 0 No I have tooth or mouth problems that make it hard for me to eat 0 No I don't always have enough money to buy the food I need 0 No I eat alone most of the time 0 No I take three or more different prescribed or over-the-counter drugs a day 1 Yes Without wanting to, I have lost or gained 10 pounds in the last six months 0 No I am not always physically able to shop, cook and/or feed myself 2 Yes Nutrition Protocols Good Risk Protocol Moderate Risk Protocol 0 Provide education on nutrition High Risk Proctocol Risk Level: Moderate Risk Score: 5 Electronic Signature(s) Signed: 04/08/2020 5:52:57 PM By: Theresa Hurst RN, BSN Entered By: [name] on 04/08/2020 14:25:06

## 2020-04-08 NOTE — Progress Notes (Signed)
Theresa, [name] ([id number]) Visit Report for 04/08/2020 Chief Complaint Document Details Patient Name: Date of Service: [name] NDSmitty [name] 04/08/2020 1:15 PM Medical Record Number: [id number] Patient Account Number: [id number] Date of Birth/Sex: Treating RN: Jan 29, 1961 (60 y.o. [name] Primary Care Provider: [name] Other Clinician: Referring Provider: Treating Provider/Extender: [name] in Treatment: 0 Information Obtained from: Patient Chief Complaint 04/08/2020; this is a patient who is here for review of wounds in her left groin secondary to hidradenitis suppurativa Electronic Signature(s) Signed: 04/08/2020 5:33:44 PM By: Linton Ham MD Entered By: Linton Ham on 04/08/2020 15:00:19 -------------------------------------------------------------------------------- Debridement Details Patient Name: Date of Service: [name] [place], [place] 04/08/2020 1:15 PM Medical Record Number: [id number] Patient Account Number: [id number] Date of Birth/Sex: Treating RN: [birth year] (60 y.o. [name] Primary Care Provider: [name] Other Clinician: Referring Provider: Treating Provider/Extender: [name] in Treatment: 0 Debridement Performed for Assessment: Wound #1 Right Groin Performed By: Clinician Baruch Gouty, RN Debridement Type: Chemical/Enzymatic/Mechanical Agent Used: gauze Level of Consciousness (Pre-procedure): Awake and Alert Pre-procedure Verification/Time Out No Taken: Bleeding: None Response to Treatment: Procedure was tolerated well Level of Consciousness (Post- Awake and Alert procedure): Post Debridement Measurements of Total Wound Length: (cm) 9.5 Width: (cm) 0.4 Depth: (cm) 0.1 Volume: (cm) 0.298 Character of Wound/Ulcer Post Debridement: Improved Post Procedure Diagnosis Same as Pre-procedure Electronic Signature(s) Signed: 04/08/2020 5:33:44 PM By: Linton Ham MD Signed: 04/08/2020 5:52:16 PM By: Baruch Gouty RN, BSN Entered By: Baruch Gouty on 04/08/2020 14:57:11 -------------------------------------------------------------------------------- Debridement Details Patient Name: Date of Service: [name], [name] 04/08/2020 1:15 PM Medical Record Number: [id number] Patient Account Number: [id number] Date of Birth/Sex: Treating RN: 1960-05-01 (60 y.o. [name] Primary Care Provider: [name] Other Clinician: Referring Provider: Treating Provider/Extender: [name] in Treatment: 0 Debridement Performed for Assessment: Wound #2 Left,Proximal Groin Performed By: Clinician Baruch Gouty, RN Debridement Type: Chemical/Enzymatic/Mechanical Agent Used: gauze Level of Consciousness (Pre-procedure): Awake and Alert Pre-procedure Verification/Time Out No Taken: Bleeding: None Response to Treatment: Procedure was tolerated well Level of Consciousness (Post- Awake and Alert procedure): Post Debridement Measurements of Total Wound Length: (cm) 1.2 Width: (cm) 0.5 Depth: (cm) 0.4 Volume: (cm) 0.188 Character of Wound/Ulcer Post Debridement: Improved Post Procedure Diagnosis Same as Pre-procedure Electronic Signature(s) Signed: 04/08/2020 5:33:44 PM By: Linton Ham MD Signed: 04/08/2020 5:52:16 PM By: Baruch Gouty RN, BSN Entered By: Baruch Gouty on 04/08/2020 14:57:35 -------------------------------------------------------------------------------- Debridement Details Patient Name: Date of Service: Theresa [place], [name] 04/08/2020 1:15 PM Medical Record Number: [id number] Patient Account Number: [id number] Date of Birth/Sex: Treating RN: May 30, 1960 (60 y.o. [name] Primary Care Provider: [name] Other Clinician: Referring Provider: Treating Provider/Extender: [name] in Treatment: 0 Debridement Performed for Assessment:  Wound #3 Left,Distal Groin Performed By: Clinician Baruch Gouty, RN Debridement Type: Chemical/Enzymatic/Mechanical Agent Used: gauze Level of Consciousness (Pre-procedure): Awake and Alert Pre-procedure Verification/Time Out No Taken: Bleeding: None Response to Treatment: Procedure was tolerated well Level of Consciousness (Post- Awake and Alert procedure): Post Debridement Measurements of Total Wound Length: (cm) 1.7 Width: (cm) 0.6 Depth: (cm) 0.5 Volume: (cm) 0.401 Character of Wound/Ulcer Post Debridement: Improved Post Procedure Diagnosis Same as Pre-procedure Electronic Signature(s) Signed: 04/08/2020 5:33:44 PM By: Linton Ham MD Signed: 04/08/2020 5:52:16 PM By: Baruch Gouty RN, BSN Entered By: Baruch Gouty on 04/08/2020 14:58:05 -------------------------------------------------------------------------------- HPI Details Patient Name: Date of Service: Theresa, [name] 04/08/2020 1:15 PM Medical Record Number: [id number] Patient  Account Number: [id number] Date of Birth/Sex: Treating RN: [birth year] (60 y.o. [name] Primary Care Provider: [name] Other Clinician: Referring Provider: Treating Provider/Extender: [name] in Treatment: 0 History of Present Illness HPI Description: ADMISSION 04/08/2020 This is a pleasant 60 year old woman who is a type II diabetic relatively poorly controlled with a recent hemoglobin A1c of 10.9. However she has a prolonged history of hidradenitis suppurativa diagnosed sometime before 2012. She had surgery by Dr. Darene Lamer removing the sweat glands in the left axilla at that time. oth Some time thereafter she developed areas in her left groin area. These have waxed and waned but never really healed. She also concerned that she had areas in the right groin area. She has only been applying Neosporin to these areas Past medical history includes type 2 diabetes, pulmonary nodules, left  pontine CVA, surgery to the left axilla in 2012, hypertension and hypercholesterolemia Electronic Signature(s) Signed: 04/08/2020 5:33:44 PM By: Linton Ham MD Entered By: Linton Ham on 04/08/2020 15:02:11 -------------------------------------------------------------------------------- Physical Exam Details Patient Name: Date of Service: [place], [place] 04/08/2020 1:15 PM Medical Record Number: [id number] Patient Account Number: [id number] Date of Birth/Sex: Treating RN: [birth year] (59 y.o. [name] Primary Care Provider: [name] Other Clinician: Referring Provider: Treating Provider/Extender: [name] in Treatment: 0 Constitutional Patient is hypertensive.. Pulse regular and within target range for patient.Marland Kitchen Respirations regular, non-labored and within target range.. Temperature is normal and within the target range for the patient.Marland Kitchen Appears in no distress. Integumentary (Hair, Skin) There is surgical scars in the left axilla and there is also remanence of disease in the right axilla but no open areas no draining sinuses. She has 1 single skin tag in the right axilla.. Notes Wound exam; 4 small areas in the left groin area/left inguinal area. These do not tunnel. Some inflammation. No real sinus tracts. Electronic Signature(s) Signed: 04/08/2020 5:33:44 PM By: Linton Ham MD Signed: 04/08/2020 5:33:44 PM By: Linton Ham MD Entered By: Linton Ham on 04/08/2020 15:06:32 -------------------------------------------------------------------------------- Physician Orders Details Patient Name: Date of Service: [facility] [place], [place] 04/08/2020 1:15 PM Medical Record Number: [id number] Patient Account Number: [id number] Date of Birth/Sex: Treating RN: 07-28-1960 (60 y.o. [name] Primary Care Provider: [name] Other Clinician: Referring Provider: Treating Provider/Extender: [name] in Treatment: 0 Verbal / Phone Orders: No Diagnosis Coding Follow-up Appointments Return Appointment in 2 weeks. Bathing/ Shower/ Hygiene May shower and wash wound with soap and water. Wound Treatment Wound #1 - Groin Wound Laterality: Right Prim Dressing: KerraCel Ag Gelling Fiber Dressing, 4x5 in (silver alginate) (DME) (Dispense As Written) 1 x Per Day/30 Days ary Discharge Instructions: Apply silver alginate to wound bed as instructed Secondary Dressing: Woven Gauze Sponge, Non-Sterile 4x4 in (DME) (Generic) 1 x Per Day/30 Days Discharge Instructions: Apply over primary dressing as directed. Secured With: 46M Medipore H Soft Cloth Surgical Tape, 2x2 (in/yd) (DME) (Generic) 1 x Per Day/30 Days Discharge Instructions: Secure dressing with tape as directed. Wound #2 - Groin Wound Laterality: Left, Proximal Prim Dressing: KerraCel Ag Gelling Fiber Dressing, 4x5 in (silver alginate) (DME) (Dispense As Written) 1 x Per Day/30 Days ary Discharge Instructions: Apply silver alginate to wound bed as instructed Prim Dressing: Maxorb Extra Calcium Alginate Dressing, 4x4 in 1 x Per Day/30 Days ary Discharge Instructions: Apply calcium alginate to wound bed as instructed Secondary Dressing: Woven Gauze Sponge, Non-Sterile 4x4 in (DME) (Generic) 1 x Per Day/30 Days Discharge  Instructions: Apply over primary dressing as directed. Secured With: 40M Medipore H Soft Cloth Surgical Tape, 2x2 (in/yd) (DME) (Generic) 1 x Per Day/30 Days Discharge Instructions: Secure dressing with tape as directed. Wound #3 - Groin Wound Laterality: Left, Distal Prim Dressing: KerraCel Ag Gelling Fiber Dressing, 4x5 in (silver alginate) (DME) (Dispense As Written) 1 x Per Day/30 Days ary Discharge Instructions: Apply silver alginate to wound bed as instructed Secondary Dressing: Woven Gauze Sponge, Non-Sterile 4x4 in (DME) (Generic) 1 x Per Day/30 Days Discharge Instructions: Apply over primary dressing as  directed. Secured With: 40M Medipore H Soft Cloth Surgical Tape, 2x2 (in/yd) (DME) (Generic) 1 x Per Day/30 Days Discharge Instructions: Secure dressing with tape as directed. Patient Medications llergies: tramadol, Ozempic A Notifications Medication Indication Start End hidradentis 04/08/2020 doxycycline monohydrate DOSE oral 75 mg capsule - 1 capsule oral bid for 2 weeks hidradentis 04/08/2020 doxycycline hyclate DOSE oral 100 mg capsule - 1capsule oral bid for 2 weeks (disregard previous order) Electronic Signature(s) Signed: 04/08/2020 4:50:04 PM By: Linton Ham MD Previous Signature: 04/08/2020 3:09:35 PM Version By: Linton Ham MD Entered By: Linton Ham on 04/08/2020 16:50:03 -------------------------------------------------------------------------------- Problem List Details Patient Name: Date of Service: [facility] [place], [place] 04/08/2020 1:15 PM Medical Record Number: [id number] Patient Account Number: [id number] Date of Birth/Sex: Treating RN: 19-Aug-1960 (60 y.o. [name] Primary Care Provider: [name] Other Clinician: Referring Provider: Treating Provider/Extender: [name] in Treatment: 0 Active Problems ICD-10 Encounter Code Description Active Date MDM Diagnosis L73.2 Hidradenitis suppurativa 04/08/2020 No Yes L98.498 Non-pressure chronic ulcer of skin of other sites with other specified severity 04/08/2020 No Yes Inactive Problems Resolved Problems Electronic Signature(s) Signed: 04/08/2020 5:33:44 PM By: Linton Ham MD Entered By: Linton Ham on 04/08/2020 14:59:39 -------------------------------------------------------------------------------- Progress Note Details Patient Name: Date of Service: [place], [place] 04/08/2020 1:15 PM Medical Record Number: [id number] Patient Account Number: [id number] Date of Birth/Sex: Treating RN: March 13, 1960 (60 y.o. [name] Primary Care Provider:  [name] Other Clinician: Referring Provider: Treating Provider/Extender: [name] in Treatment: 0 Subjective Chief Complaint Information obtained from Patient 04/08/2020; this is a patient who is here for review of wounds in her left groin secondary to hidradenitis suppurativa History of Present Illness (HPI) ADMISSION 04/08/2020 This is a pleasant 60 year old woman who is a type II diabetic relatively poorly controlled with a recent hemoglobin A1c of 10.9. However she has a prolonged history of hidradenitis suppurativa diagnosed sometime before 2012. She had surgery by Dr. Darene Lamer removing the sweat glands in the left axilla at that time. oth Some time thereafter she developed areas in her left groin area. These have waxed and waned but never really healed. She also concerned that she had areas in the right groin area. She has only been applying Neosporin to these areas Past medical history includes type 2 diabetes, pulmonary nodules, left pontine CVA, surgery to the left axilla in 2012, hypertension and hypercholesterolemia Patient History Information obtained from Patient. Allergies tramadol (Severity: Moderate, Reaction: nausea), Ozempic (Severity: Moderate, Reaction: nausea) Family History Cancer - Siblings,Mother, Diabetes - Father, Hypertension - Mother,Siblings, Kidney Disease - Mother, Seizures - Mother, No family history of Heart Disease, Hereditary Spherocytosis, Lung Disease, Stroke, Thyroid Problems, Tuberculosis. Social History Never smoker, Marital Status - Widowed, Alcohol Use - Never, Drug Use - No History, Caffeine Use - Daily. Medical History Cardiovascular Patient has history of Hypertension Endocrine Patient has history of Type II Diabetes Musculoskeletal Patient has history of Osteoarthritis  Patient is treated with Insulin. Blood sugar is tested. Medical A Surgical History Notes nd Cardiovascular CVA 2021,  Hyperlipidemia Gastrointestinal GERD, Anal fistula Integumentary (Skin) Hidradenitis, sweat glands removed left arm 2012 Psychiatric Hx PTSD, Anxiety Review of Systems (ROS) Constitutional Symptoms (General Health) Denies complaints or symptoms of Fatigue, Fever, Chills, Marked Weight Change. Eyes Denies complaints or symptoms of Dry Eyes, Vision Changes, Glasses / Contacts. Ear/Nose/Mouth/Throat Denies complaints or symptoms of Chronic sinus problems or rhinitis. Respiratory Denies complaints or symptoms of Chronic or frequent coughs, Shortness of Breath. Integumentary (Skin) Complains or has symptoms of Wounds - bilateral groin wounds. Neurologic Denies complaints or symptoms of Numbness/parasthesias. Psychiatric Denies complaints or symptoms of Claustrophobia, Suicidal. Objective Constitutional Patient is hypertensive.. Pulse regular and within target range for patient.Marland Kitchen Respirations regular, non-labored and within target range.. Temperature is normal and within the target range for the patient.Marland Kitchen Appears in no distress. Vitals Time Taken: 2:00 PM, Height: 63 in, Source: Stated, Weight: 272 lbs, Source: Stated, BMI: 48.2, Temperature: 98.6 F, Pulse: 85 bpm, Respiratory Rate: 18 breaths/min, Blood Pressure: 153/80 mmHg, Capillary Blood Glucose: 123 mg/dl. General Notes: glucose per pt report General Notes: Wound exam; 4 small areas in the left groin area/left inguinal area. These do not tunnel. Some inflammation. No real sinus tracts. Integumentary (Hair, Skin) There is surgical scars in the left axilla and there is also remanence of disease in the right axilla but no open areas no draining sinuses. She has 1 single skin tag in the right axilla.. Wound #1 status is Open. Original cause of wound was Gradually Appeared. The wound is located on the Right Groin. The wound measures 9.5cm length x 0.4cm width x 0.1cm depth; 2.985cm^2 area and 0.298cm^3 volume. There is Fat Layer  (Subcutaneous Tissue) exposed. There is no tunneling or undermining noted. There is a medium amount of serosanguineous drainage noted. The wound margin is flat and intact. There is large (67-100%) pink granulation within the wound bed. There is no necrotic tissue within the wound bed. Wound #2 status is Open. Original cause of wound was Gradually Appeared. The wound is located on the Left,Proximal Groin. The wound measures 1.2cm length x 0.5cm width x 0.4cm depth; 0.471cm^2 area and 0.188cm^3 volume. There is Fat Layer (Subcutaneous Tissue) exposed. There is no tunneling or undermining noted. There is a medium amount of serosanguineous drainage noted. The wound margin is flat and intact. There is large (67-100%) pink granulation within the wound bed. There is no necrotic tissue within the wound bed. Wound #3 status is Open. Original cause of wound was Gradually Appeared. The wound is located on the Left,Distal Groin. The wound measures 1.7cm length x 0.6cm width x 0.5cm depth; 0.801cm^2 area and 0.401cm^3 volume. There is Fat Layer (Subcutaneous Tissue) exposed. There is no tunneling or undermining noted. There is a medium amount of serosanguineous drainage noted. The wound margin is flat and intact. There is large (67-100%) pink granulation within the wound bed. There is no necrotic tissue within the wound bed. Assessment Active Problems ICD-10 Hidradenitis suppurativa Non-pressure chronic ulcer of skin of other sites with other specified severity Procedures Wound #1 Pre-procedure diagnosis of Wound #1 is a Hidradenitis located on the Right Groin . There was a Chemical/Enzymatic/Mechanical debridement performed by Baruch Gouty, RN.. Other agent used was gauze. There was no bleeding. The procedure was tolerated well. Post Debridement Measurements: 9.5cm length x 0.4cm width x 0.1cm depth; 0.298cm^3 volume. Character of Wound/Ulcer Post Debridement is improved. Post procedure Diagnosis Wound  #  1: Same as Pre-Procedure Wound #2 Pre-procedure diagnosis of Wound #2 is a Hidradenitis located on the Left,Proximal Groin . There was a Chemical/Enzymatic/Mechanical debridement performed by Baruch Gouty, RN.. Other agent used was gauze. There was no bleeding. The procedure was tolerated well. Post Debridement Measurements: 1.2cm length x 0.5cm width x 0.4cm depth; 0.188cm^3 volume. Character of Wound/Ulcer Post Debridement is improved. Post procedure Diagnosis Wound #2: Same as Pre-Procedure Wound #3 Pre-procedure diagnosis of Wound #3 is a Hidradenitis located on the Left,Distal Groin . There was a Chemical/Enzymatic/Mechanical debridement performed by Baruch Gouty, RN.. Other agent used was gauze. There was no bleeding. The procedure was tolerated well. Post Debridement Measurements: 1.7cm length x 0.6cm width x 0.5cm depth; 0.401cm^3 volume. Character of Wound/Ulcer Post Debridement is improved. Post procedure Diagnosis Wound #3: Same as Pre-Procedure Plan Follow-up Appointments: Return Appointment in 2 weeks. Bathing/ Shower/ Hygiene: May shower and wash wound with soap and water. The following medication(s) was prescribed: doxycycline monohydrate oral 75 mg capsule 1 capsule oral bid for 2 weeks for hidradentis starting 04/08/2020 WOUND #1: - Groin Wound Laterality: Right Prim Dressing: KerraCel Ag Gelling Fiber Dressing, 4x5 in (silver alginate) (DME) (Dispense As Written) 1 x Per Day/30 Days ary Discharge Instructions: Apply silver alginate to wound bed as instructed Secondary Dressing: Woven Gauze Sponge, Non-Sterile 4x4 in (DME) (Generic) 1 x Per Day/30 Days Discharge Instructions: Apply over primary dressing as directed. Secured With: 56M Medipore H Soft Cloth Surgical T ape, 2x2 (in/yd) (DME) (Generic) 1 x Per Day/30 Days Discharge Instructions: Secure dressing with tape as directed. WOUND #2: - Groin Wound Laterality: Left, Proximal Prim Dressing: KerraCel Ag Gelling  Fiber Dressing, 4x5 in (silver alginate) (DME) (Dispense As Written) 1 x Per Day/30 Days ary Discharge Instructions: Apply silver alginate to wound bed as instructed Secondary Dressing: Woven Gauze Sponge, Non-Sterile 4x4 in (DME) (Generic) 1 x Per Day/30 Days Discharge Instructions: Apply over primary dressing as directed. Secured With: 56M Medipore H Soft Cloth Surgical T ape, 2x2 (in/yd) (DME) (Generic) 1 x Per Day/30 Days Discharge Instructions: Secure dressing with tape as directed. WOUND #3: - Groin Wound Laterality: Left, Distal Prim Dressing: KerraCel Ag Gelling Fiber Dressing, 4x5 in (silver alginate) (DME) (Dispense As Written) 1 x Per Day/30 Days ary Discharge Instructions: Apply silver alginate to wound bed as instructed Secondary Dressing: Woven Gauze Sponge, Non-Sterile 4x4 in (DME) (Generic) 1 x Per Day/30 Days Discharge Instructions: Apply over primary dressing as directed. Secured With: 56M Medipore H Soft Cloth Surgical T ape, 2x2 (in/yd) (DME) (Generic) 1 x Per Day/30 Days Discharge Instructions: Secure dressing with tape as directed. 1. I think this is relatively mild hidradenitis. I am going to give this patient a course of doxycycline both is an antibiotic and an anti-inflammatory 2. Silver alginate to the areas in her left groin to keep this dry. 3. I reassured the patient that if her disease worsens there are resources now including I think a hidradenitis clinic at [facility]. There has been good success with biologic agents like Humira in this disease but I do not think that this is severe enough right now to consider this. I think the patient was reassured that there is at least options of there now Electronic Signature(s) Signed: 04/08/2020 5:33:44 PM By: Linton Ham MD Entered By: Linton Ham on 04/08/2020 15:10:46 -------------------------------------------------------------------------------- HxROS Details Patient Name: Date of Service: [place], [place]  04/08/2020 1:15 PM Medical Record Number: [id number] Patient Account Number: [id number] Date of Birth/Sex: Treating RN:  [birth year] (61 y.o. [name] Primary Care Provider: [name] Other Clinician: Referring Provider: Treating Provider/Extender: [name] in Treatment: 0 Information Obtained From Patient Constitutional Symptoms (General Health) Complaints and Symptoms: Negative for: Fatigue; Fever; Chills; Marked Weight Change Eyes Complaints and Symptoms: Negative for: Dry Eyes; Vision Changes; Glasses / Contacts Ear/Nose/Mouth/Throat Complaints and Symptoms: Negative for: Chronic sinus problems or rhinitis Respiratory Complaints and Symptoms: Negative for: Chronic or frequent coughs; Shortness of Breath Integumentary (Skin) Complaints and Symptoms: Positive for: Wounds - bilateral groin wounds Medical History: Past Medical History Notes: Hidradenitis, sweat glands removed left arm 2012 Neurologic Complaints and Symptoms: Negative for: Numbness/parasthesias Psychiatric Complaints and Symptoms: Negative for: Claustrophobia; Suicidal Medical History: Past Medical History Notes: Hx PTSD, Anxiety Hematologic/Lymphatic Cardiovascular Medical History: Positive for: Hypertension Past Medical History Notes: CVA 2021, Hyperlipidemia Gastrointestinal Medical History: Past Medical History Notes: GERD, Anal fistula Endocrine Medical History: Positive for: Type II Diabetes Time with diabetes: 2011 Treated with: Insulin Blood sugar tested every day: Yes Tested : twice a day Immunological Musculoskeletal Medical History: Positive for: Osteoarthritis Oncologic Immunizations Pneumococcal Vaccine: Received Pneumococcal Vaccination: Yes Implantable Devices None Family and Social History Cancer: Yes - Siblings,Mother; Diabetes: Yes - Father; Heart Disease: No; Hereditary Spherocytosis: No; Hypertension: Yes - Mother,Siblings;  Kidney Disease: Yes - Mother; Lung Disease: No; Seizures: Yes - Mother; Stroke: No; Thyroid Problems: No; Tuberculosis: No; Never smoker; Marital Status - Widowed; Alcohol Use: Never; Drug Use: No History; Caffeine Use: Daily; Financial Concerns: No; Food, Clothing or Shelter Needs: No; Support System Lacking: No; Transportation Concerns: No Engineer, maintenance) Signed: 04/08/2020 5:33:44 PM By: Linton Ham MD Signed: 04/08/2020 5:52:57 PM By: [name] RN, BSN Entered By: [name] on 04/08/2020 14:23:11 -------------------------------------------------------------------------------- SuperBill Details Patient Name: Date of Service: [facility] [place], [place] 04/08/2020 Medical Record Number: [id number] Patient Account Number: [id number] Date of Birth/Sex: Treating RN: [birth year] (60 y.o. [name] Primary Care Provider: [name] Other Clinician: Referring Provider: Treating Provider/Extender: [name] in Treatment: 0 Diagnosis Coding ICD-10 Codes Code Description L73.2 Hidradenitis suppurativa L98.498 Non-pressure chronic ulcer of skin of other sites with other specified severity Facility Procedures Physician Procedures : CPT4 Code Description Modifier [id number] [place] PHYS LEVEL 3 NEW PT ICD-10 Diagnosis Description L73.2 Hidradenitis suppurativa L98.498 Non-pressure chronic ulcer of skin of other sites with other specified severity Quantity: 1 Electronic Signature(s) Signed: 04/08/2020 5:33:44 PM By: Linton Ham MD Entered By: Linton Ham on 04/08/2020 15:11:23

## 2020-04-10 ENCOUNTER — Other Ambulatory Visit: Payer: Self-pay | Admitting: Medical

## 2020-04-12 ENCOUNTER — Other Ambulatory Visit: Payer: Self-pay

## 2020-04-12 ENCOUNTER — Encounter: Payer: Self-pay | Admitting: Medical

## 2020-04-12 ENCOUNTER — Ambulatory Visit (INDEPENDENT_AMBULATORY_CARE_PROVIDER_SITE_OTHER): Payer: BC Managed Care – PPO | Admitting: Medical

## 2020-04-12 VITALS — BP 138/82 | HR 79 | Ht 63.0 in | Wt 265.8 lb

## 2020-04-12 DIAGNOSIS — R238 Other skin changes: Secondary | ICD-10-CM

## 2020-04-12 DIAGNOSIS — IMO0002 Reserved for concepts with insufficient information to code with codable children: Secondary | ICD-10-CM

## 2020-04-12 DIAGNOSIS — Z794 Long term (current) use of insulin: Secondary | ICD-10-CM

## 2020-04-12 DIAGNOSIS — R748 Abnormal levels of other serum enzymes: Secondary | ICD-10-CM | POA: Insufficient documentation

## 2020-04-12 DIAGNOSIS — E119 Type 2 diabetes mellitus without complications: Secondary | ICD-10-CM | POA: Insufficient documentation

## 2020-04-12 DIAGNOSIS — B373 Candidiasis of vulva and vagina: Secondary | ICD-10-CM

## 2020-04-12 DIAGNOSIS — E1165 Type 2 diabetes mellitus with hyperglycemia: Secondary | ICD-10-CM

## 2020-04-12 DIAGNOSIS — E162 Hypoglycemia, unspecified: Secondary | ICD-10-CM | POA: Insufficient documentation

## 2020-04-12 DIAGNOSIS — E559 Vitamin D deficiency, unspecified: Secondary | ICD-10-CM | POA: Insufficient documentation

## 2020-04-12 DIAGNOSIS — B3731 Acute candidiasis of vulva and vagina: Secondary | ICD-10-CM

## 2020-04-12 DIAGNOSIS — L304 Erythema intertrigo: Secondary | ICD-10-CM

## 2020-04-12 DIAGNOSIS — R6883 Chills (without fever): Secondary | ICD-10-CM | POA: Diagnosis not present

## 2020-04-12 DIAGNOSIS — E118 Type 2 diabetes mellitus with unspecified complications: Secondary | ICD-10-CM

## 2020-04-12 LAB — CBC WITH DIFFERENTIAL/PLATELET
Basophils Absolute: 0.1 10*3/uL (ref 0.0–0.2)
Basos: 1 %
EOS (ABSOLUTE): 0.5 10*3/uL — ABNORMAL HIGH (ref 0.0–0.4)
Eos: 4 %
Hematocrit: 42.8 % (ref 34.0–46.6)
Hemoglobin: 13.4 g/dL (ref 11.1–15.9)
Immature Grans (Abs): 0 10*3/uL (ref 0.0–0.1)
Immature Granulocytes: 0 %
Lymphocytes Absolute: 3.2 10*3/uL — ABNORMAL HIGH (ref 0.7–3.1)
Lymphs: 27 %
MCH: 24.8 pg — ABNORMAL LOW (ref 26.6–33.0)
MCHC: 31.3 g/dL — ABNORMAL LOW (ref 31.5–35.7)
MCV: 79 fL (ref 79–97)
Monocytes Absolute: 0.7 10*3/uL (ref 0.1–0.9)
Monocytes: 6 %
Neutrophils Absolute: 7.5 10*3/uL — ABNORMAL HIGH (ref 1.4–7.0)
Neutrophils: 62 %
Platelets: 374 10*3/uL (ref 150–450)
RBC: 5.4 x10E6/uL — ABNORMAL HIGH (ref 3.77–5.28)
RDW: 13.4 % (ref 11.7–15.4)
WBC: 12.1 10*3/uL — ABNORMAL HIGH (ref 3.4–10.8)

## 2020-04-12 LAB — POC COVID19 BINAXNOW: SARS Coronavirus 2 Ag: NEGATIVE

## 2020-04-12 MED ORDER — VITAMIN D 25 MCG (1000 UNIT) PO TABS: 1000.0000 [IU] | ORAL_TABLET | Freq: Every day | ORAL | 3 refills | Status: DC

## 2020-04-12 MED ORDER — BASAGLAR KWIKPEN 100 UNIT/ML ~~LOC~~ SOPN: 100.0000 [IU] | PEN_INJECTOR | SUBCUTANEOUS | 1 refills | Status: DC

## 2020-04-12 MED ORDER — FIASP FLEXTOUCH 100 UNIT/ML ~~LOC~~ SOPN: PEN_INJECTOR | SUBCUTANEOUS | 1 refills | Status: DC

## 2020-04-12 NOTE — Progress Notes (Signed)
Subjective: Chief Complaint  Patient presents with  . Follow-up    Need covid test for work. Blood sugar was low due to not eating on last Friday    Here as a work in today acute visit  She notes that she had been in her usual state of health. However last Thursday on January 27 she was feeling exhausted. She got a new puppy recently and this is wearing her out.  That evening she came home from a long day at work and unlike her norm she decided not to eat supper that night. She had bought supper on the way home but was so exhausted she did not even need it. She woke up at 1:30 AM and took her evening dose of insulin. A while later she felt chills and weak, she checked her blood sugar and it was down around 68. This is the first time she has had a low sugar reading.  Again she normally does not skip meals. She felt exhausted due to lack of sleep the night before and with taking care of her new puppy. She has continued her regular medication regimen. She called into work out for a few days because she felt so exhausted. Given the fact that she mentioned she will they wanted her to have a negative Covid test before she comes back to work. Does she needs a rapid Covid test today  She currently feels fine. She denies cough, runny nose, sneezing, sore throat, body aches, chills, headache, fever. She denies urinary symptoms. No diarrhea. No nausea or vomiting.  Her sugars have been back in her typical ranges since the Friday after her symptoms  She has her upcoming appointment with Dr.  Kelton Pillar for diabetes in about a month. This will be establish care appointment   She got into the wound clinic recently for her intertrigonoua ulcerations. She was put on a topical silver-based treatment as well as oral doxycycline. The doxycycline did not settle well with her stomach which is one of the reason she called in for work as well.  Otherwise feels fine. No chest pain or shortness of breath no dizziness  no lightheadedness.  She checks her blood sugars twice daily, she is still doing Fiasp 30 units per meal 3 times a day and Basaglar 100 units daily  She notes complete improvement of the vaginal yeast infection recently from the medicine prescribed last  Past Medical History:  Diagnosis Date  . Abscess    increased drainage from abscess on buttock  . Anal fistula   . Anxiety   . Bilateral hip pain 05/27/2015  . Chronic pain syndrome 05/27/2015  . Depression    sees [name]  . Diabetes mellitus without complication ([place])   . Hyperlipidemia   . Hypertension   . Sleep apnea    2008- sleep study, neg. for sleep apnea   . Stroke ([place])   . SVT (supraventricular tachycardia) ([place])   . Symptomatic cholelithiasis 09/11/2018   Current Outpatient Medications on File Prior to Visit  Medication Sig Dispense Refill  . aspirin 81 MG EC tablet Take 1 tablet (81 mg total) by mouth daily. 90 tablet 3  . carvedilol (COREG) 6.25 MG tablet Take 1 tablet (6.25 mg total) by mouth 2 (two) times daily. 180 tablet 3  . dexlansoprazole (DEXILANT) 60 MG capsule Take 1 capsule (60 mg total) by mouth daily. 90 capsule 3  . diclofenac (VOLTAREN) 50 MG EC tablet Take 1 tablet by mouth twice daily 180  tablet 0  . doxycycline (VIBRAMYCIN) 100 MG capsule Take 100 mg by mouth 2 (two) times daily.    . DULoxetine (CYMBALTA) 60 MG capsule Take 1 capsule by mouth twice daily 180 capsule 0  . furosemide (LASIX) 80 MG tablet Take 1 tablet (80 mg total) by mouth daily. 90 tablet 3  . glucose blood test strip ONE TOUCH VERIO TEST STRIPS TO TEST 1-2 TIMES A DAY 100 each 12  . HYDROcodone-acetaminophen (NORCO) 10-325 MG tablet Take 1 tablet by mouth every 6 (six) hours as needed. Please do not  Fill before 03/29/2020 120 tablet 0  . nortriptyline (PAMELOR) 25 MG capsule Take 1 capsule by mouth at bedtime 90 capsule 1  . potassium chloride (KLOR-CON) 10 MEQ tablet Take 1 tablet (10 mEq total) by mouth daily. 90 tablet 3  .  rosuvastatin (CRESTOR) 20 MG tablet Take 1 tablet (20 mg total) by mouth daily. 90 tablet 3   No current facility-administered medications on file prior to visit.     Review of systems as in subjective   Objective: BP 138/82   Pulse 79   Ht 5' 3"  (1.6 m)   Wt 265 lb 12.8 oz (120.6 kg)   LMP 02/09/2017   SpO2 98%   BMI 47.08 kg/m   General appearence: alert, no distress, WD/WN, well-appearing HEENT: normocephalic, sclerae anicteric, TMs pearly, nares patent, no discharge or erythema, pharynx normal Oral cavity: MMM, no lesions Neck: supple, no lymphadenopathy, no thyromegaly, no masses Heart: RRR, normal S1, S2, no murmurs Lungs: CTA bilaterally, no wheezes, rhonchi, or rales Pulses: 2+ symmetric, upper and lower extremities, normal cap refill Neuro: Nonfocal exam No extremity edema    assessment: Encounter Diagnoses  Name Primary?  . Chills Yes  . Uncontrolled diabetes mellitus with complications ([place])   . Insulin dependent type 2 diabetes mellitus ([place])   . Alkaline phosphatase elevation   . Vitamin D deficiency   . Hypoglycemia   . Yeast vaginitis   . Intertrigo   . Skin breakdown       Plan: Chills/shakes, diabetes, single hypoglycemic episode -likely related to her recent single hypoglycemic episode. This is the only time she has ever had a hypoglycemic episode she reports. I advise she not skip meals. We discussed if she does have to skip a meal at times, to consider either taking half dose of her mealtime insulin at that time or if blood sugar pre meal is under 120, do not even take the mealtime dose of insulin at that time. Advise she go back to 28 units of Fiasp or mealtime currently and continue nighttime insulin 100 units basal. She has new patient appointment with endocrinology, Dr. Kelton Pillar soon.  I wrote her prescription for freestyle libre device which would be much easier for her  Alkaline phosphatase elevation-we discussed her recent lab findings  including isoenzymes showing elevated liver portion of her alkaline phosphatase. She has upcoming appointment with gastroenterology soon. I will recheck vitamin D level since her vitamin D has been low in the past which can also contribute to the elevated alk phos  Yeast vaginitis from recent visit resolved  I am happy she got into wound clinic recently for her intertrigo this issue and skin breakdown-they are managing her wound at this time. I reviewed the recent wound clinic notes and appreciate their help  Vitamin D deficiency-advised supplement with vitamin D    Theona was seen today for follow-up.  Diagnoses and all orders for this visit:  Chills -     POC COVID-19 BinaxNow -     CBC with Differential/Platelet  Uncontrolled diabetes mellitus with complications ([facility])  Insulin dependent type 2 diabetes mellitus ([facility])  Alkaline phosphatase elevation -     Vitamin D, 25-hydroxy -     CBC with Differential/Platelet  Vitamin D deficiency -     Vitamin D, 25-hydroxy  Hypoglycemia  Yeast vaginitis  Intertrigo  Skin breakdown  Other orders -     Insulin Glargine (BASAGLAR KWIKPEN) 100 UNIT/ML; Inject 100 Units into the skin every morning. And pen needles 1/day -     FIASP FLEXTOUCH 100 UNIT/ML FlexTouch Pen; SMARTSIG: 28 Unit(s) SUB-Q 3 Times Daily -     cholecalciferol (VITAMIN D3) 25 MCG (1000 UNIT) tablet; Take 1 tablet (1,000 Units total) by mouth daily.   Follow-up pending labs

## 2020-04-13 ENCOUNTER — Other Ambulatory Visit: Payer: Self-pay | Admitting: Medical

## 2020-04-13 LAB — VITAMIN D 25 HYDROXY (VIT D DEFICIENCY, FRACTURES): Vit D, 25-Hydroxy: 5.5 ng/mL — ABNORMAL LOW (ref 30.0–100.0)

## 2020-04-13 MED ORDER — VITAMIN D (ERGOCALCIFEROL) 1.25 MG (50000 UNIT) PO CAPS: 50000.0000 [IU] | ORAL_CAPSULE | ORAL | 1 refills | Status: DC

## 2020-04-13 MED ORDER — CALCIUM CARBONATE 600 MG PO TABS: 600.0000 mg | ORAL_TABLET | Freq: Two times a day (BID) | ORAL | 3 refills | Status: AC

## 2020-04-17 ENCOUNTER — Other Ambulatory Visit: Payer: Self-pay | Admitting: Physical Medicine & Rehabilitation

## 2020-04-17 DIAGNOSIS — G894 Chronic pain syndrome: Secondary | ICD-10-CM

## 2020-04-17 DIAGNOSIS — M160 Bilateral primary osteoarthritis of hip: Secondary | ICD-10-CM

## 2020-04-17 DIAGNOSIS — Z5181 Encounter for therapeutic drug level monitoring: Secondary | ICD-10-CM

## 2020-04-17 DIAGNOSIS — Z79899 Other long term (current) drug therapy: Secondary | ICD-10-CM

## 2020-04-20 ENCOUNTER — Ambulatory Visit: Payer: BC Managed Care – PPO

## 2020-04-22 ENCOUNTER — Encounter (HOSPITAL_BASED_OUTPATIENT_CLINIC_OR_DEPARTMENT_OTHER): Payer: BC Managed Care – PPO | Attending: Internal Medicine | Admitting: Internal Medicine

## 2020-04-22 ENCOUNTER — Other Ambulatory Visit: Payer: Self-pay

## 2020-04-22 DIAGNOSIS — L98498 Non-pressure chronic ulcer of skin of other sites with other specified severity: Secondary | ICD-10-CM | POA: Diagnosis present

## 2020-04-22 DIAGNOSIS — Z8673 Personal history of transient ischemic attack (TIA), and cerebral infarction without residual deficits: Secondary | ICD-10-CM | POA: Diagnosis not present

## 2020-04-22 DIAGNOSIS — E11622 Type 2 diabetes mellitus with other skin ulcer: Secondary | ICD-10-CM | POA: Diagnosis not present

## 2020-04-22 NOTE — Progress Notes (Signed)
HANALEI, GLACE ([id number]) Visit Report for 04/22/2020 HPI Details Patient Name: Date of Service: [facility] 04/22/2020 2:30 PM Medical Record Number: [id number] Patient Account Number: [id number] Date of Birth/Sex: Treating RN: 1960-03-14 (60 y.o. [name] Primary Care Provider: [name] Other Clinician: Referring Provider: Treating Provider/Extender: [name] in Treatment: 2 History of Present Illness HPI Description: ADMISSION 04/08/2020 This is a pleasant 61 year old woman who is a type II diabetic relatively poorly controlled with a recent hemoglobin A1c of 10.9. However she has a prolonged history of hidradenitis suppurativa diagnosed sometime before 2012. She had surgery by Dr. Darene Lamer removing the sweat glands in the left axilla at that time. oth Some time thereafter she developed areas in her left groin area. These have waxed and waned but never really healed. She also concerned that she had areas in the right groin area. She has only been applying Neosporin to these areas Past medical history includes type 2 diabetes, pulmonary nodules, left pontine CVA, surgery to the left axilla in 2012, hypertension and hypercholesterolemia 2/11; patient's growing actually looks better. I would like to extend the doxycycline continue with the silver alginate. She is complaining of vaginal itching with white drainage Electronic Signature(s) Signed: 04/22/2020 5:36:32 PM By: Linton Ham MD Entered By: Linton Ham on 04/22/2020 15:59:33 -------------------------------------------------------------------------------- Physical Exam Details Patient Name: Date of Service: [place], [place] 04/22/2020 2:30 PM Medical Record Number: [id number] Patient Account Number: [id number] Date of Birth/Sex: Treating RN: [birth year] (60 y.o. [name] Primary Care Provider: [name] Other Clinician: Referring Provider: Treating  Provider/Extender: [name] in Treatment: 2 Constitutional Sitting or standing Blood Pressure is within target range for patient.. Pulse regular and within target range for patient.Marland Kitchen Respirations regular, non-labored and within target range.. Temperature is normal and within the target range for the patient.Marland Kitchen Appears in no distress. Notes Wound exam; she is down to 3 small open areas. All of these look healthy. There is no depth no sinus tracts. No evidence of surrounding infection this is in her left inguinal area Electronic Signature(s) Signed: 04/22/2020 5:36:32 PM By: Linton Ham MD Entered By: Linton Ham on 04/22/2020 16:00:35 -------------------------------------------------------------------------------- Physician Orders Details Patient Name: Date of Service: [place], [place] 04/22/2020 2:30 PM Medical Record Number: [id number] Patient Account Number: [id number] Date of Birth/Sex: Treating RN: 07-13-1960 (60 y.o. 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 Bathing/ Shower/ Hygiene May shower and wash wound with soap and water. Non Wound Condition Other Non Wound Condition Orders/Instructions: - 1 probiotic tablet per day, pick up script for diflucan Wound Treatment Wound #1 - Groin Wound Laterality: Right Prim Dressing: KerraCel Ag Gelling Fiber Dressing, 4x5 in (silver alginate) (Dispense As Written) 1 x Per Day/30 Days ary Discharge Instructions: Apply silver alginate to wound bed as instructed Secondary Dressing: Woven Gauze Sponge, Non-Sterile 4x4 in  (Generic) 1 x Per Day/30 Days Discharge Instructions: Apply over primary dressing as directed. Secured With: 72M Medipore H Soft Cloth Surgical Tape, 2x2 (in/yd) (Generic) 1 x Per Day/30 Days Discharge Instructions: Secure dressing with tape as directed. Wound #2 - Groin Wound Laterality: Left, Proximal Prim Dressing: KerraCel Ag Gelling Fiber Dressing, 4x5 in (silver  alginate) (Dispense As Written) 1 x Per Day/30 Days ary Discharge Instructions: Apply silver alginate to wound bed as instructed Prim Dressing: Maxorb Extra Calcium Alginate Dressing, 4x4 in 1 x Per Day/30 Days ary Discharge Instructions: Apply calcium alginate to wound bed as instructed Secondary Dressing: Woven Gauze Sponge, Non-Sterile 4x4 in (Generic) 1 x Per Day/30 Days Discharge Instructions: Apply over primary dressing as directed. Secured With: 30M Medipore H Soft Cloth Surgical Tape, 2x2 (in/yd) (Generic) 1 x Per Day/30 Days Discharge Instructions: Secure dressing with tape as directed. Wound #3 - Groin Wound Laterality: Left, Distal Prim Dressing: KerraCel Ag Gelling Fiber Dressing, 4x5 in (silver alginate) (Dispense As Written) 1 x Per Day/30 Days ary Discharge Instructions: Apply silver alginate to wound bed as instructed Secondary Dressing: Woven Gauze Sponge, Non-Sterile 4x4 in (Generic) 1 x Per Day/30 Days Discharge Instructions: Apply over primary dressing as directed. Secured With: 30M Medipore H Soft Cloth Surgical Tape, 2x2 (in/yd) (Generic) 1 x Per Day/30 Days Discharge Instructions: Secure dressing with tape as directed. Patient Medications llergies: tramadol, Ozempic A Notifications Medication Indication Start End hidradenitis 04/22/2020 doxycycline monohydrate DOSE oral 100 mg capsule - 1 capsule oral bid for 2 weeks (continuing rx) vaginits 04/22/2020 Diflucan DOSE oral 100 mg tablet - 1 tablet oral daily for 3 days Electronic Signature(s) Signed: 04/22/2020 4:08:04 PM By: Linton Ham MD Entered  By: Linton Ham on 04/22/2020 16:08:03 -------------------------------------------------------------------------------- Problem List Details Patient Name: Date of Service: [place], [place] 04/22/2020 2:30 PM Medical Record Number: [id number] Patient Account Number: [id number] Date of Birth/Sex: Treating RN: [birth year] (60 y.o. [name] Primary Care Provider: [name] Other Clinician: Referring Provider: Treating Provider/Extender: [name] in Treatment: 2 Active Problems ICD-10 Encounter Code Description Active Date MDM Diagnosis L73.2 Hidradenitis suppurativa 04/08/2020 No Yes L98.498 Non-pressure chronic ulcer of skin of other sites with other specified severity 04/08/2020 No Yes Inactive Problems Resolved Problems Electronic Signature(s) Signed: 04/22/2020 5:36:32 PM By: Linton Ham MD Entered By: Linton Ham on 04/22/2020 15:57:40 -------------------------------------------------------------------------------- Progress Note Details Patient Name: Date of Service: [place], [place] 04/22/2020 2:30 PM Medical Record Number: [id number] Patient Account Number: [id number] Date of Birth/Sex: Treating RN: September 23, 1960 (60 y.o. [name] Primary Care Provider: [name] Other Clinician: Referring Provider: Treating Provider/Extender: [name] in Treatment: 2 Subjective History of Present Illness (HPI) ADMISSION 04/08/2020 This is a pleasant 60 year old woman who is a type II diabetic relatively poorly controlled with a recent hemoglobin A1c of 10.9. However she has a prolonged history of hidradenitis suppurativa diagnosed sometime before 2012. She had surgery by Dr. Darene Lamer removing the sweat glands in the left axilla at that time. oth Some time thereafter she developed areas in her left groin area. These have waxed and waned but never really healed. She also concerned that she  had areas in the right groin area. She has only been applying Neosporin to these areas Past medical history includes type 2 diabetes, pulmonary nodules, left pontine CVA, surgery to the left axilla in 2012, hypertension and hypercholesterolemia 2/11; patient's growing actually looks better. I would like to extend the doxycycline continue with the silver alginate. She is complaining of vaginal itching with white drainage Objective Constitutional Sitting or standing Blood Pressure is within target range for patient.. Pulse regular and within target range for patient.Marland Kitchen Respirations regular, non-labored and within target range.. Temperature is normal and within the target range for the patient.Marland Kitchen Appears in no distress. Vitals Time Taken:  2:47 PM, Height: 63 in, Weight: 272 lbs, BMI: 48.2, Temperature: 98.4 F, Pulse: 85 bpm, Respiratory Rate: 17 breaths/min, Blood Pressure: 135/85 mmHg, Capillary Blood Glucose: 130 mg/dl. General Notes: Wound exam; she is down to 3 small open areas. All of these look healthy. There is no depth no sinus tracts. No evidence of surrounding infection this is in her left inguinal area Integumentary (Hair, Skin) Wound #1 status is Open. Original cause of wound was Gradually Appeared. The wound is located on the Right Groin. The wound measures 0.4cm length x 0.4cm width x 0.1cm depth; 0.126cm^2 area and 0.013cm^3 volume. There is Fat Layer (Subcutaneous Tissue) exposed. There is no tunneling or undermining noted. There is a medium amount of serosanguineous drainage noted. The wound margin is flat and intact. There is large (67-100%) pink granulation within the wound bed. There is no necrotic tissue within the wound bed. Wound #2 status is Open. Original cause of wound was Gradually Appeared. The wound is located on the Left,Proximal Groin. The wound measures 1.5cm length x 0.5cm width x 0.1cm depth; 0.589cm^2 area and 0.059cm^3 volume. There is Fat Layer (Subcutaneous  Tissue) exposed. There is no tunneling or undermining noted. There is a medium amount of serosanguineous drainage noted. The wound margin is flat and intact. There is large (67-100%) pink granulation within the wound bed. There is no necrotic tissue within the wound bed. Wound #3 status is Open. Original cause of wound was Gradually Appeared. The wound is located on the Left,Distal Groin. The wound measures 1.5cm length x 0.5cm width x 0.2cm depth; 0.589cm^2 area and 0.118cm^3 volume. There is Fat Layer (Subcutaneous Tissue) exposed. There is no tunneling or undermining noted. There is a medium amount of serosanguineous drainage noted. The wound margin is flat and intact. There is large (67-100%) pink granulation within the wound bed. There is no necrotic tissue within the wound bed. Assessment Active Problems ICD-10 Hidradenitis suppurativa Non-pressure chronic ulcer of skin of other sites with other specified severity Plan Follow-up Appointments: Return Appointment in 2 weeks. Bathing/ Shower/ Hygiene: May shower and wash wound with soap and water. Non Wound Condition: Other Non Wound Condition Orders/Instructions: - 1 probiotic tablet per day, pick up script for diflucan WOUND #1: - Groin Wound Laterality: Right Prim Dressing: KerraCel Ag Gelling Fiber Dressing, 4x5 in (silver alginate) (Dispense As Written) 1 x Per Day/30 Days ary Discharge Instructions: Apply silver alginate to wound bed as instructed Secondary Dressing: Woven Gauze Sponge, Non-Sterile 4x4 in (Generic) 1 x Per Day/30 Days Discharge Instructions: Apply over primary dressing as directed. Secured With: 52M Medipore H Soft Cloth Surgical T ape, 2x2 (in/yd) (Generic) 1 x Per Day/30 Days Discharge Instructions: Secure dressing with tape as directed. WOUND #2: - Groin Wound Laterality: Left, Proximal Prim Dressing: KerraCel Ag Gelling Fiber Dressing, 4x5 in (silver alginate) (Dispense As Written) 1 x Per Day/30  Days ary Discharge Instructions: Apply silver alginate to wound bed as instructed Prim Dressing: Maxorb Extra Calcium Alginate Dressing, 4x4 in 1 x Per Day/30 Days ary Discharge Instructions: Apply calcium alginate to wound bed as instructed Secondary Dressing: Woven Gauze Sponge, Non-Sterile 4x4 in (Generic) 1 x Per Day/30 Days Discharge Instructions: Apply over primary dressing as directed. Secured With: 52M Medipore H Soft Cloth Surgical T ape, 2x2 (in/yd) (Generic) 1 x Per Day/30 Days Discharge Instructions: Secure dressing with tape as directed. WOUND #3: - Groin Wound Laterality: Left, Distal Prim Dressing: KerraCel Ag Gelling Fiber Dressing, 4x5 in (silver alginate) (Dispense As Written)  1 x Per Day/30 Days ary Discharge Instructions: Apply silver alginate to wound bed as instructed Secondary Dressing: Woven Gauze Sponge, Non-Sterile 4x4 in (Generic) 1 x Per Day/30 Days Discharge Instructions: Apply over primary dressing as directed. Secured With: 7M Medipore H Soft Cloth Surgical T ape, 2x2 (in/yd) (Generic) 1 x Per Day/30 Days Discharge Instructions: Secure dressing with tape as directed. 1. I am continue with the silver alginate 2. I have renewed her doxycycline for another 2 weeks 3. Diflucan x3 Electronic Signature(s) Signed: 04/22/2020 4:09:51 PM By: Linton Ham MD Entered By: Linton Ham on 04/22/2020 16:09:50 -------------------------------------------------------------------------------- SuperBill Details Patient Name: Date of Service: [facility] [place], [place] 04/22/2020 Medical Record Number: [id number] Patient Account Number: [id number] Date of Birth/Sex: Treating RN: [birth year] (60 y.o. [name] Primary Care Provider: [name] Other Clinician: Referring Provider: Treating Provider/Extender: [name] in Treatment: 2 Diagnosis Coding ICD-10 Codes Code Description L73.2 Hidradenitis suppurativa L98.498  Non-pressure chronic ulcer of skin of other sites with other specified severity Facility Procedures CPT4 Code: [id number] Description: 99213 - WOUND CARE VISIT-LEV 3 EST PT Modifier: Quantity: 1 Physician Procedures : CPT4 Code Description Modifier [id number] - WC PHYS LEVEL 3 - EST PT ICD-10 Diagnosis Description L73.2 Hidradenitis suppurativa L98.498 Non-pressure chronic ulcer of skin of other sites with other specified severity Quantity: 1 Electronic Signature(s) Signed: 04/22/2020 5:36:32 PM By: Linton Ham MD Entered By: Linton Ham on 04/22/2020 16:10:13

## 2020-04-22 NOTE — Progress Notes (Signed)
[name], [name] ([id number]) Visit Report for 04/22/2020 Arrival Information Details Patient Name: Date of Service: [place] [facility] [name] 04/22/2020 2:30 PM Medical Record Number: [id number] Patient Account Number: [id number] Date of Birth/Sex: Treating RN: [birth year] (60 y.o. [name], [name] Primary Care [name]: [name] Other Clinician: Referring [name]: Treating [name]/Extender: [name] in Treatment: 2 Visit Information History Since Last Visit Added or deleted any medications: No Patient Arrived: Ambulatory Any new allergies or adverse reactions: No Arrival Time: 14:47 Had a fall or experienced change in No Accompanied By: self activities of daily living that may affect Transfer Assistance: None risk of falls: Patient Identification Verified: Yes Signs or symptoms of abuse/neglect since last visito No Secondary Verification Process Completed: Yes Hospitalized since last visit: No Patient Requires Transmission-Based Precautions: No Implantable device outside of the clinic excluding No Patient Has Alerts: No cellular tissue based products placed in the center since last visit: Has Dressing in Place as Prescribed: Yes Pain Present Now: Yes Electronic Signature(s) Signed: 04/22/2020 5:40:25 PM By: Theresa Hammock RN Entered By: [name] on 04/22/2020 14:47:42 -------------------------------------------------------------------------------- Clinic Level of Care Assessment Details Patient Name: Date of Service: [name], [name] 04/22/2020 2:30 PM Medical Record Number: [id number] Patient Account Number: [id number] Date of Birth/Sex: Treating RN: [name] (60 y.o. [name] Primary Care Jaleea Alesi: [name] Other Clinician: Referring [name]: Treating [name]/Extender: [name] in Treatment: 2 Clinic Level of Care Assessment Items TOOL 4 Quantity Score []  - 0 Use  when only an EandM is performed on FOLLOW-UP visit ASSESSMENTS - Nursing Assessment / Reassessment X- 1 10 Reassessment of Co-morbidities (includes updates in patient status) X- 1 5 Reassessment of Adherence to Treatment Plan ASSESSMENTS - Wound and Skin A ssessment / Reassessment []  - 0 Simple Wound Assessment / Reassessment - one wound X- 3 5 Complex Wound Assessment / Reassessment - multiple wounds []  - 0 Dermatologic / Skin Assessment (not related to wound area) ASSESSMENTS - Focused Assessment []  - 0 Circumferential Edema Measurements - multi extremities []  - 0 Nutritional Assessment / Counseling / Intervention []  - 0 Lower Extremity Assessment (monofilament, tuning fork, pulses) []  - 0 Peripheral Arterial Disease Assessment (using hand held doppler) ASSESSMENTS - Ostomy and/or Continence Assessment and Care []  - 0 Incontinence Assessment and Management []  - 0 Ostomy Care Assessment and Management (repouching, etc.) PROCESS - Coordination of Care X - Simple Patient / Family Education for ongoing care 1 15 []  - 0 Complex (extensive) Patient / Family Education for ongoing care X- 1 10 Staff obtains Programmer, systems, Records, T Results / Process Orders est []  - 0 Staff telephones HHA, Nursing Homes / Clarify orders / etc []  - 0 Routine Transfer to another Facility (non-emergent condition) []  - 0 Routine Hospital Admission (non-emergent condition) []  - 0 New Admissions / Biomedical engineer / Ordering NPWT Apligraf, etc. , []  - 0 Emergency Hospital Admission (emergent condition) X- 1 10 Simple Discharge Coordination []  - 0 Complex (extensive) Discharge Coordination PROCESS - Special Needs []  - 0 Pediatric / Minor Patient Management []  - 0 Isolation Patient Management []  - 0 Hearing / Language / Visual special needs []  - 0 Assessment of Community assistance (transportation, D/C planning, etc.) []  - 0 Additional assistance / Altered mentation []  - 0 Support  Surface(s) Assessment (bed, cushion, seat, etc.) INTERVENTIONS - Wound Cleansing / Measurement []  - 0 Simple Wound Cleansing - one wound X- 3 5 Complex Wound Cleansing - multiple wounds X- 1 5 Wound  Imaging (photographs - any number of wounds) []  - 0 Wound Tracing (instead of photographs) []  - 0 Simple Wound Measurement - one wound X- 3 5 Complex Wound Measurement - multiple wounds INTERVENTIONS - Wound Dressings X - Small Wound Dressing one or multiple wounds 3 10 []  - 0 Medium Wound Dressing one or multiple wounds []  - 0 Large Wound Dressing one or multiple wounds X- 1 5 Application of Medications - topical []  - 0 Application of Medications - injection INTERVENTIONS - Miscellaneous []  - 0 External ear exam []  - 0 Specimen Collection (cultures, biopsies, blood, body fluids, etc.) []  - 0 Specimen(s) / Culture(s) sent or taken to Lab for analysis []  - 0 Patient Transfer (multiple staff / Civil Service fast streamer / Similar devices) []  - 0 Simple Staple / Suture removal (25 or less) []  - 0 Complex Staple / Suture removal (26 or more) []  - 0 Hypo / Hyperglycemic Management (close monitor of Blood Glucose) []  - 0 Ankle / Brachial Index (ABI) - do not check if billed separately X- 1 5 Vital Signs Has the patient been seen at the hospital within the last three years: Yes Total Score: 140 Level Of Care: New/Established - Level 4 Electronic Signature(s) Signed: 04/22/2020 5:49:38 PM By: Theresa Gouty RN, BSN Entered By: [name] on 04/22/2020 15:32:04 -------------------------------------------------------------------------------- Lower Extremity Assessment Details Patient Name: Date of Service: [facility] [name], [place] 04/22/2020 2:30 PM Medical Record Number: [id number] Patient Account Number: [id number] Date of Birth/Sex: Treating RN: [birth year] [name] (60 y.o. [name], [name] Primary Care Kaylla Cobos: [name] Other Clinician: Referring Kellis Topete: Treating Javanna Patin/Extender:  [name] in Treatment: 2 Electronic Signature(s) Signed: 04/22/2020 5:40:25 PM By: Theresa Hammock RN Entered By: [name] on 04/22/2020 14:48:53 -------------------------------------------------------------------------------- Multi Wound Chart Details Patient Name: Date of Service: [facility] [name], [place] 04/22/2020 2:30 PM Medical Record Number: [id number] Patient Account Number: [id number] Date of Birth/Sex: Treating RN: [name][birth year] (60 y.o. [name] Primary Care Starlin Steib: [name] Other Clinician: Referring Judine Arciniega: Treating Montavius Subramaniam/Extender: [name] in Treatment: 2 Vital Signs Height(in): 63 Capillary Blood Glucose(mg/dl): 130 Weight(lbs): 272 Pulse(bpm): 28 Body Mass Index(BMI): 48 Blood Pressure(mmHg): 135/85 Temperature(F): 98.4 Respiratory Rate(breaths/min): 17 Photos: [1:No Photos Right Groin] [2:No Photos Left, Proximal Groin] [3:No Photos Left, Distal Groin] Wound Location: [1:Gradually Appeared] [2:Gradually Appeared] [3:Gradually Appeared] Wounding Event: [1:Hidradenitis] [2:Hidradenitis] [3:Hidradenitis] Primary Etiology: [1:Hypertension, Type II Diabetes,] [2:Hypertension, Type II Diabetes,] [3:Hypertension, Type II Diabetes,] Comorbid History: [1:Osteoarthritis 03/13/2015] [2:Osteoarthritis 03/13/2015] [3:Osteoarthritis 03/13/2015] Date Acquired: [1:2] [2:2] [3:2] Weeks of Treatment: [1:Open] [2:Open] [3:Open] Wound Status: [1:No] [2:Yes] [3:Yes] Clustered Wound: [1:N/A] [2:2] [3:2] Clustered Quantity: [1:0.4x0.4x0.1] [2:1.5x0.5x0.1] [3:1.5x0.5x0.2] Measurements L x W x D (cm) [1:0.126] [2:0.589] [3:0.589] A (cm) : rea [1:0.013] [2:0.059] [3:0.118] Volume (cm) : [1:95.80%] [2:-25.10%] [3:26.50%] % Reduction in Area: [1:95.60%] [2:68.60%] [3:70.60%] % Reduction in Volume: [1:Full Thickness Without Exposed] [2:Full Thickness Without Exposed] [3:Full Thickness Without  Exposed] Classification: [1:Support Structures Medium] [2:Support Structures Medium] [3:Support Structures Medium] Exudate Amount: [1:Serosanguineous] [2:Serosanguineous] [3:Serosanguineous] Exudate Type: [1:red, brown] [2:red, brown] [3:red, brown] Exudate Color: [1:Flat and Intact] [2:Flat and Intact] [3:Flat and Intact] Wound Margin: [1:Large (67-100%)] [2:Large (67-100%)] [3:Large (67-100%)] Granulation Amount: [1:Pink] [2:Pink] [3:Pink] Granulation Quality: [1:None Present (0%)] [2:None Present (0%)] [3:None Present (0%)] Necrotic Amount: [1:Fat Layer (Subcutaneous Tissue): Yes Fat Layer (Subcutaneous Tissue): Yes Fat Layer (Subcutaneous Tissue): Yes] Exposed Structures: [1:Fascia: No Tendon: No Muscle: No Joint: No Bone: No Small (1-33%)] [2:Fascia: No Tendon: No Muscle: No Joint: No Bone: No  Small (1-33%)] [3:Fascia: No Tendon: No Muscle: No Joint: No Bone: No Small (1-33%)] Treatment Notes Electronic Signature(s) Signed: 04/22/2020 5:36:32 PM By: Linton Ham MD Signed: 04/22/2020 5:49:38 PM By: Theresa Gouty RN, BSN Entered By: Linton Ham on 04/22/2020 15:57:46 -------------------------------------------------------------------------------- Multi-Disciplinary Care Plan Details Patient Name: Date of Service: [facility] [name], [place] 04/22/2020 2:30 PM Medical Record Number: [id number] Patient Account Number: [id number] Date of Birth/Sex: Treating RN: [birth year] (61 y.o. [name] Primary Care Kyandra Mcclaine: [name] Other Clinician: Referring Ionia Schey: Treating Mireya Meditz/Extender: [name] in Treatment: 2 Active Inactive Nutrition Nursing Diagnoses: Impaired glucose control: actual or potential Potential for alteratiion in Nutrition/Potential for imbalanced nutrition Goals: Patient/caregiver will maintain therapeutic glucose control Date Initiated: 04/08/2020 Target Resolution Date: 05/06/2020 Goal Status:  Active Interventions: Assess patient nutrition upon admission and as needed per policy Provide education on elevated blood sugars and impact on wound healing Treatment Activities: Patient referred to Primary Care Physician for further nutritional evaluation : 04/08/2020 Notes: Wound/Skin Impairment Nursing Diagnoses: Impaired tissue integrity Knowledge deficit related to ulceration/compromised skin integrity Goals: Patient/caregiver will verbalize understanding of skin care regimen Date Initiated: 04/08/2020 Target Resolution Date: 05/06/2020 Goal Status: Active Ulcer/skin breakdown will have a volume reduction of 30% by week 4 Date Initiated: 04/08/2020 Target Resolution Date: 05/06/2020 Goal Status: Active Interventions: Assess patient/caregiver ability to obtain necessary supplies Assess patient/caregiver ability to perform ulcer/skin care regimen upon admission and as needed Assess ulceration(s) every visit Provide education on ulcer and skin care Treatment Activities: Skin care regimen initiated : 04/08/2020 Topical wound management initiated : 04/08/2020 Notes: Electronic Signature(s) Signed: 04/22/2020 5:49:38 PM By: Theresa Gouty RN, BSN Entered By: [name] on 04/22/2020 15:29:44 -------------------------------------------------------------------------------- Pain Assessment Details Patient Name: Date of Service: [name], [place] 04/22/2020 2:30 PM Medical Record Number: [id number] Patient Account Number: [id number] Date of Birth/Sex: Treating RN: [birth year] (60 y.o. [name], [name] Primary Care Raychel Dowler: [name] Other Clinician: Referring Congetta Odriscoll: Treating Evonna Stoltz/Extender: [name] in Treatment: 2 Active Problems Location of Pain Severity and Description of Pain Patient Has Paino Yes Site Locations Pain Location: Pain in Ulcers With Dressing Change: Yes Duration of the Pain. Constant / Intermittento  Intermittent Rate the pain. Current Pain Level: 6 Worst Pain Level: 10 Least Pain Level: 0 Tolerable Pain Level: 7 Character of Pain Describe the Pain: Aching Pain Management and Medication Current Pain Management: Medication: Yes Cold Application: No Rest: Yes Massage: No Activity: No T.E.N.S.: No Heat Application: No Leg drop or elevation: No Is the Current Pain Management Adequate: Adequate How does your wound impact your activities of daily livingo Sleep: No Bathing: No Appetite: No Relationship With Others: No Bladder Continence: No Emotions: No Bowel Continence: No Work: No Toileting: No Drive: No Dressing: No Hobbies: No Electronic Signature(s) Signed: 04/22/2020 5:40:25 PM By: Theresa Hammock RN Entered By: [name] on 04/22/2020 14:48:47 -------------------------------------------------------------------------------- Patient/Caregiver Education Details Patient Name: Date of Service: [facility] [name], JUA NITA 2/11/2022andnbsp2:30 PM Medical Record Number: [id number] Patient Account Number: [id number] Date of Birth/Gender: Treating RN: [name] (60 y.o. [name] Primary Care Physician: [name] Other Clinician: Referring Physician: Treating Physician/Extender: [name] in Treatment: 2 Education Assessment Education Provided To: Patient Education Topics Provided Infection: Methods: Explain/Verbal Responses: Reinforcements needed, State content correctly Wound/Skin Impairment: Methods: Explain/Verbal Responses: Reinforcements needed, State content correctly Electronic Signature(s) Signed: 04/22/2020 5:49:38 PM By: Theresa Gouty RN, BSN Entered By: [name] on 04/22/2020 15:30:22 -------------------------------------------------------------------------------- Wound Assessment Details Patient Name:  Date of Service: [place] NDSmitty [name] 04/22/2020 2:30 PM Medical Record Number:  [id number] Patient Account Number: [id number] Date of Birth/Sex: Treating RN: [name][birth year] (60 y.o. [name], [name] Primary Care Victoire Deans: [name] Other Clinician: Referring Momoka Stringfield: Treating Clance Baquero/Extender: [name] in Treatment: 2 Wound Status Wound Number: 1 Primary Etiology: Hidradenitis Wound Location: Right Groin Wound Status: Open Wounding Event: Gradually Appeared Comorbid History: Hypertension, Type II Diabetes, Osteoarthritis Date Acquired: 03/13/2015 Weeks Of Treatment: 2 Clustered Wound: No Wound Measurements Length: (cm) 0.4 Width: (cm) 0.4 Depth: (cm) 0.1 Area: (cm) 0.126 Volume: (cm) 0.013 Wound Description Classification: Full Thickness Without Exposed Support Stru Wound Margin: Flat and Intact Exudate Amount: Medium Exudate Type: Serosanguineous Exudate Color: red, brown Foul Odor After Cleansing: Slough/Fibrino % Reduction in Area: 95.8% % Reduction in Volume: 95.6% Epithelialization: Small (1-33%) Tunneling: No Undermining: No ctures No No Wound Bed Granulation Amount: Large (67-100%) Exposed Structure Granulation Quality: Pink Fascia Exposed: No Necrotic Amount: None Present (0%) Fat Layer (Subcutaneous Tissue) Exposed: Yes Tendon Exposed: No Muscle Exposed: No Joint Exposed: No Bone Exposed: No Electronic Signature(s) Signed: 04/22/2020 5:40:25 PM By: Theresa Hammock RN Entered By: [name] on 04/22/2020 14:54:10 -------------------------------------------------------------------------------- Wound Assessment Details Patient Name: Date of Service: STRICKLA [name], [place] 04/22/2020 2:30 PM Medical Record Number: [id number] Patient Account Number: [id number] Date of Birth/Sex: Treating RN: [birth year] [name] (60 y.o. [name], [name] Primary Care Radley Teston: [name] Other Clinician: Referring Alvia Tory: Treating Titianna Loomis/Extender: [name] in Treatment:  2 Wound Status Wound Number: 2 Primary Etiology: Hidradenitis Wound Location: Left, Proximal Groin Wound Status: Open Wounding Event: Gradually Appeared Comorbid History: Hypertension, Type II Diabetes, Osteoarthritis Date Acquired: 03/13/2015 Weeks Of Treatment: 2 Clustered Wound: Yes Wound Measurements Length: (cm) 1.5 Width: (cm) 0.5 Depth: (cm) 0.1 Clustered Quantity: 2 Area: (cm) 0.589 Volume: (cm) 0.059 % Reduction in Area: -25.1% % Reduction in Volume: 68.6% Epithelialization: Small (1-33%) Tunneling: No Undermining: No Wound Description Classification: Full Thickness Without Exposed Support Structures Wound Margin: Flat and Intact Exudate Amount: Medium Exudate Type: Serosanguineous Exudate Color: red, brown Foul Odor After Cleansing: No Slough/Fibrino No Wound Bed Granulation Amount: Large (67-100%) Exposed Structure Granulation Quality: Pink Fascia Exposed: No Necrotic Amount: None Present (0%) Fat Layer (Subcutaneous Tissue) Exposed: Yes Tendon Exposed: No Muscle Exposed: No Joint Exposed: No Bone Exposed: No Electronic Signature(s) Signed: 04/22/2020 5:40:25 PM By: Theresa Hammock RN Signed: 04/22/2020 5:40:25 PM By: Theresa Hammock RN Entered By: [name] on 04/22/2020 14:54:55 -------------------------------------------------------------------------------- Wound Assessment Details Patient Name: Date of Service: Chili [name], [place] 04/22/2020 2:30 PM Medical Record Number: [id number] Patient Account Number: [id number] Date of Birth/Sex: Treating RN: [name][birth year] (60 y.o. [name], [name] Primary Care Doryce Mcgregory: [name] Other Clinician: Referring Larah Kuntzman: Treating Suede Greenawalt/Extender: [name] in Treatment: 2 Wound Status Wound Number: 3 Primary Etiology: Hidradenitis Wound Location: Left, Distal Groin Wound Status: Open Wounding Event: Gradually Appeared Comorbid History: Hypertension, Type  II Diabetes, Osteoarthritis Date Acquired: 03/13/2015 Weeks Of Treatment: 2 Clustered Wound: Yes Wound Measurements Length: (cm) 1.5 Width: (cm) 0.5 Depth: (cm) 0.2 Clustered Quantity: 2 Area: (cm) 0.589 Volume: (cm) 0.118 % Reduction in Area: 26.5% % Reduction in Volume: 70.6% Epithelialization: Small (1-33%) Tunneling: No Undermining: No Wound Description Classification: Full Thickness Without Exposed Support Structures Wound Margin: Flat and Intact Exudate Amount: Medium Exudate Type: Serosanguineous Exudate Color: red, brown Foul Odor After Cleansing: No Slough/Fibrino No Wound Bed Granulation Amount: Large (67-100%) Exposed Structure Granulation Quality: Pink Fascia Exposed:  No Necrotic Amount: None Present (0%) Fat Layer (Subcutaneous Tissue) Exposed: Yes Tendon Exposed: No Muscle Exposed: No Joint Exposed: No Bone Exposed: No Electronic Signature(s) Signed: 04/22/2020 5:40:25 PM By: Theresa Hammock RN Entered By: [name] on 04/22/2020 14:55:28 -------------------------------------------------------------------------------- [place] Details Patient Name: Date of Service: Kincaid [name], [place] 04/22/2020 2:30 PM Medical Record Number: [id number] Patient Account Number: [id number] Date of Birth/Sex: Treating RN: [birth year][name] (60 y.o. [name], [name] Primary Care Kattleya Kuhnert: [name] Other Clinician: Referring Jontavia Leatherbury: Treating Yasmen Cortner/Extender: [name] in Treatment: 2 Vital Signs Time Taken: 14:47 Temperature (F): 98.4 Height (in): 63 Pulse (bpm): 85 Weight (lbs): 272 Respiratory Rate (breaths/min): 17 Body Mass Index (BMI): 48.2 Blood Pressure (mmHg): 135/85 Capillary Blood Glucose (mg/dl): 130 Reference Range: 80 - 120 mg / dl Electronic Signature(s) Signed: 04/22/2020 5:40:25 PM By: Theresa Hammock RN Entered By: [name] on 04/22/2020 14:48:09

## 2020-04-25 ENCOUNTER — Encounter: Payer: Self-pay | Admitting: *Deleted

## 2020-04-28 ENCOUNTER — Encounter: Payer: BC Managed Care – PPO | Attending: Physical Medicine & Rehabilitation | Admitting: Registered Nurse

## 2020-04-28 ENCOUNTER — Other Ambulatory Visit: Payer: Self-pay

## 2020-04-28 VITALS — BP 135/77 | HR 87 | Temp 99.0°F | Ht 63.0 in | Wt 274.0 lb

## 2020-04-28 DIAGNOSIS — M5416 Radiculopathy, lumbar region: Secondary | ICD-10-CM | POA: Insufficient documentation

## 2020-04-28 DIAGNOSIS — Z79891 Long term (current) use of opiate analgesic: Secondary | ICD-10-CM | POA: Insufficient documentation

## 2020-04-28 DIAGNOSIS — M62830 Muscle spasm of back: Secondary | ICD-10-CM | POA: Insufficient documentation

## 2020-04-28 DIAGNOSIS — G894 Chronic pain syndrome: Secondary | ICD-10-CM | POA: Insufficient documentation

## 2020-04-28 DIAGNOSIS — Z5181 Encounter for therapeutic drug level monitoring: Secondary | ICD-10-CM | POA: Insufficient documentation

## 2020-04-28 DIAGNOSIS — M7061 Trochanteric bursitis, right hip: Secondary | ICD-10-CM | POA: Insufficient documentation

## 2020-04-28 DIAGNOSIS — M7062 Trochanteric bursitis, left hip: Secondary | ICD-10-CM | POA: Diagnosis present

## 2020-04-28 MED ORDER — HYDROCODONE-ACETAMINOPHEN 10-325 MG PO TABS: 1.0000 | ORAL_TABLET | Freq: Four times a day (QID) | ORAL | 0 refills | Status: DC | PRN

## 2020-04-28 NOTE — Progress Notes (Signed)
Subjective:    Patient ID: [name], female    DOB: [birth year], 60 y.o.   MRN: [id number]  HPI: [name] is a 60 y.o. female who returns for follow up appointment for chronic pain and medication refill. She states her pain is located in her lower back radiating into her bilateral hips and bilateral lower extremities. She rates her pain 7. Her current exercise regime is walking and performing stretching exercises.  [name] Morphine equivalent is 40.00 MME.  Last Oral Swab was Performed on 09/01/2019, it was consistent.      Pain Inventory Average Pain 8 Pain Right Now 7 My pain is sharp, dull, stabbing and tingling  In the last 24 hours, has pain interfered with the following? General activity 9 Relation with others 10 Enjoyment of life 9 What TIME of day is your pain at its worst? daytime and evening Sleep (in general) Fair  Pain is worse with: walking, standing and some activites Pain improves with: rest, heat/ice and medication Relief from Meds: 6  Family History  Problem Relation Age of Onset  . Hypertension Mother   . Alzheimer's disease Mother   . Diabetes Father   . Breast cancer Sister 31  . Anesthesia problems Neg Hx   . Hypotension Neg Hx   . Malignant hyperthermia Neg Hx   . Pseudochol deficiency Neg Hx   . Colon cancer Neg Hx   . Esophageal cancer Neg Hx   . Stomach cancer Neg Hx   . Rectal cancer Neg Hx   . Thyroid disease Neg Hx    Social History   Socioeconomic History  . Marital status: Divorced    Spouse name: Not on file  . Number of children: 0  . Years of education: Not on file  . Highest education level: Not on file  Occupational History  . Occupation: TEACHER ASSISTANT  Tobacco Use  . Smoking status: Never Smoker  . Smokeless tobacco: Never Used  Vaping Use  . Vaping Use: Never used  Substance and Sexual Activity  . Alcohol use: No  . Drug use: No  . Sexual activity: Yes  Other Topics Concern  . Not on  file  Social History Narrative  . Not on file   Social Determinants of Health   Financial Resource Strain: Not on file  Food Insecurity: Food Insecurity Present  . Worried About Charity fundraiser in the Last Year: Sometimes true  . Ran Out of Food in the Last Year: Sometimes true  Transportation Needs: No Transportation Needs  . Lack of Transportation (Medical): No  . Lack of Transportation (Non-Medical): No  Physical Activity: Not on file  Stress: Not on file  Social Connections: Not on file   Past Surgical History:  Procedure Laterality Date  . ANAL EXAMINATION UNDER ANESTHESIA  02/21/11   anal fistula  . BREAST SURGERY  patient does not remember date of procedure   pull fluid off lft br  . CHOLECYSTECTOMY N/A 09/12/2018   Procedure: LAPAROSCOPIC CHOLECYSTECTOMY;  Surgeon: Stark Klein, MD;  Location: [place];  Service: General;  Laterality: N/A;  . ELECTROPHYSIOLOGIC STUDY N/A 05/05/2015   Procedure: SVT Ablation;  Surgeon: Will Meredith Leeds, MD;  Location: [facility];  Service: Cardiovascular;  Laterality: N/A;  . EP IMPLANTABLE DEVICE N/A 01/30/2016   Procedure: Loop Recorder Insertion;  Surgeon: Sanda Klein, MD;  Location: [facility];  Service: Cardiovascular;  Laterality: N/A;  . INCISE AND DRAIN ABCESS  abscess on right thigh and buttock  . KNEE ARTHROSCOPY     left  . LAPAROSCOPIC APPENDECTOMY N/A 09/19/2018   Procedure: APPENDECTOMY LAPAROSCOPIC;  Surgeon: Clovis Riley, MD;  Location: [facility];  Service: General;  Laterality: N/A;  . SHOULDER SURGERY  04/14/09   right   Past Surgical History:  Procedure Laterality Date  . ANAL EXAMINATION UNDER ANESTHESIA  02/21/11   anal fistula  . BREAST SURGERY  patient does not remember date of procedure   pull fluid off lft br  . CHOLECYSTECTOMY N/A 09/12/2018   Procedure: LAPAROSCOPIC CHOLECYSTECTOMY;  Surgeon: Stark Klein, MD;  Location: [facility];  Service: General;  Laterality: N/A;  .  ELECTROPHYSIOLOGIC STUDY N/A 05/05/2015   Procedure: SVT Ablation;  Surgeon: Will Meredith Leeds, MD;  Location: [facility];  Service: Cardiovascular;  Laterality: N/A;  . EP IMPLANTABLE DEVICE N/A 01/30/2016   Procedure: Loop Recorder Insertion;  Surgeon: Sanda Klein, MD;  Location: [facility];  Service: Cardiovascular;  Laterality: N/A;  . INCISE AND DRAIN ABCESS     abscess on right thigh and buttock  . KNEE ARTHROSCOPY     left  . LAPAROSCOPIC APPENDECTOMY N/A 09/19/2018   Procedure: APPENDECTOMY LAPAROSCOPIC;  Surgeon: Clovis Riley, MD;  Location: [facility];  Service: General;  Laterality: N/A;  . SHOULDER SURGERY  04/14/09   right   Past Medical History:  Diagnosis Date  . Abscess    increased drainage from abscess on buttock  . Anal fistula   . Anxiety   . Bilateral hip pain 05/27/2015  . Chronic pain syndrome 05/27/2015  . Depression    sees [name]  . Diabetes mellitus without complication ([place])   . Diverticulosis   . Elevated alkaline phosphatase level   . Fatty liver   . Gallstones   . Hiatal hernia   . Hyperlipidemia   . Hypertension   . Internal hemorrhoids   . Sleep apnea    2008- sleep study, neg. for sleep apnea   . Stroke ([place])   . SVT (supraventricular tachycardia) ([place])   . Symptomatic cholelithiasis 09/11/2018   BP 135/77   Pulse 87   Temp 99 F (37.2 C)   Ht 5\' 3"  (1.6 m)   Wt 274 lb (124.3 kg)   LMP 02/09/2017   SpO2 95%   BMI 48.54 kg/m   Opioid Risk Score:   Fall Risk Score:  `1  Depression screen PHQ 2/9  Depression screen [facility] 2/9 02/29/2020 09/03/2019 07/06/2019 06/30/2019 10/28/2017 10/02/2017  Decreased Interest 0 0 1 0 0 0  Down, Depressed, Hopeless 0 0 1 0 0 3  PHQ - 2 Score 0 0 2 0 0 3  Altered sleeping - 1 1 - - 1  Tired, decreased energy - 1 2 - - 0  Change in appetite - 0 0 - - 1  Feeling bad or failure about yourself  - 0 0 - - 0  Trouble concentrating - 0 1 - - 0  Moving slowly or fidgety/restless - 0 0 - -  0  Suicidal thoughts - 0 0 - - 0  PHQ-9 Score - 2 6 - - 5  Difficult doing work/chores - - Not difficult at all - - Somewhat difficult  Some recent data might be hidden    Review of Systems  Constitutional: Negative.   HENT: Negative.   Eyes: Negative.   Respiratory: Negative.   Cardiovascular: Negative.   Gastrointestinal: Negative.   Endocrine:  Negative.   Genitourinary: Negative.   Musculoskeletal: Positive for arthralgias and back pain.  Skin: Negative.   Allergic/Immunologic: Negative.   Neurological: Negative.   Hematological: Negative.   Psychiatric/Behavioral: Negative.   All other systems reviewed and are negative.      Objective:   Physical Exam Vitals and nursing note reviewed.  Constitutional:      Appearance: Normal appearance. She is obese.  Cardiovascular:     Rate and Rhythm: Normal rate and regular rhythm.     Pulses: Normal pulses.     Heart sounds: Normal heart sounds.  Pulmonary:     Effort: Pulmonary effort is normal.     Breath sounds: Normal breath sounds.  Musculoskeletal:     Cervical back: Normal range of motion and neck supple.     Comments: Normal Muscle Bulk and Muscle Testing Reveals:  Upper Extremities: Full ROM and Muscle Strength on Right 4/5 and Left 5/5 Lumbar Paraspinal Tenderness: L-3-L-5 Bilateral Greater Trochanter Tenderness Lower Extremities: Full ROM and Muscle Strength 5/5 Arises from table slowly  Narrow Based  Gait   Skin:    General: Skin is warm and dry.  Neurological:     Mental Status: She is alert and oriented to person, place, and time.  Psychiatric:        Mood and Affect: Mood normal.        Behavior: Behavior normal.           Assessment & Plan:  1.Chronic BilateralHip pain L>R---endstage OA of hips..Continue HEP as tolerated and Heat and Ice Therapy. Refilled:Hydrocodone 10/325 one q6 hours as needed for moderate pain #120. Second script e-scribed for the following month.04/28/2020 We will  continue the opioid monitoring program, this consists of regular clinic visits, examinations, urine drug screen, pill counts as well as use of [place] Controlled Substance Reporting system. A 12 month History has been reviewed on the [place] Controlled Substance Reporting Systemon 04/28/2020 2. Lumbar Radiculitis:Continue medication regimen withPamelor. Continue Cymbalta and Continue HEP as Tolerated. Continue to Monitor.04/28/2020.- 3. Morbid obesity: Continue with healthy diet Regime and HEP. Encouraged to continue with healthy diet regime andHEP.04/28/2020 4.BilateralGreater Trochanteric Bursitis:.Continue with Heat and Ice Therapy. Continue to Monitor.04/28/2020. 5. Left Knee Pain:No complaintsToday.Continue with HEP as Tolerated. Continue to Monitor.04/28/2020 6. Muscle Spasm:ContinueMethocarbamol. Continue to monitor.04/28/2020.   F/U in39months

## 2020-04-29 ENCOUNTER — Encounter: Payer: Self-pay | Admitting: Registered Nurse

## 2020-05-06 ENCOUNTER — Other Ambulatory Visit: Payer: Self-pay

## 2020-05-06 ENCOUNTER — Encounter (HOSPITAL_BASED_OUTPATIENT_CLINIC_OR_DEPARTMENT_OTHER): Payer: BC Managed Care – PPO | Admitting: Internal Medicine

## 2020-05-06 DIAGNOSIS — E11622 Type 2 diabetes mellitus with other skin ulcer: Secondary | ICD-10-CM | POA: Diagnosis not present

## 2020-05-06 NOTE — Progress Notes (Signed)
[name] ([id number]) Visit Report for 05/06/2020 HPI Details Patient Name: Date of Service: [name], [name] 05/06/2020 3:00 PM Medical Record Number: [id number] Patient Account Number: [id number] Date of Birth/Sex: Treating RN: 02-Oct-1960 (60 y.o. [name] Primary Care Provider: [name] Other Clinician: Referring Provider: Treating Provider/Extender: [name] in Treatment: 4 History of Present Illness HPI Description: ADMISSION 04/08/2020 This is a pleasant 60 year old woman who is a type II diabetic relatively poorly controlled with a recent hemoglobin A1c of 10.9. However she has a prolonged history of hidradenitis suppurativa diagnosed sometime before 2012. She had surgery by Dr. Darene Lamer removing the sweat glands in the left axilla at that time. oth Some time thereafter she developed areas in her left groin area. These have waxed and waned but never really healed. She also concerned that she had areas in the right groin area. She has only been applying Neosporin to these areas Past medical history includes type 2 diabetes, pulmonary nodules, left pontine CVA, surgery to the left axilla in 2012, hypertension and hypercholesterolemia 2/11; patient's growing actually looks better. I would like to extend the doxycycline continue with the silver alginate. She is complaining of vaginal itching with white drainage 2/25; left groin 2 wounds as well as superficial areas on the right. They are using silver alginate I have extended the doxycycline today but drop the dose down to 50 mg twice daily which should act as an adequate anti-inflammatory Electronic Signature(s) Signed: 05/06/2020 4:32:00 PM By: [name] Entered By: [name] on 05/06/2020 16:24:55 -------------------------------------------------------------------------------- Physical Exam Details Patient Name: Date of Service: [name], Theresa 05/06/2020 3:00  PM Medical Record Number: [id number] Patient Account Number: [id number] Date of Birth/Sex: Treating RN: [birth year] (60 y.o. [name] Primary Care Provider: [name] Other Clinician: Referring Provider: Treating Provider/Extender: [name] in Treatment: 4 Constitutional Patient is hypertensive.. Pulse regular and within target range for patient.Marland Kitchen Respirations regular, non-labored and within target range.. Temperature is normal and within the target range for the patient.Marland Kitchen Appears in no distress. Notes Wound exam; 3 small open areas now down to 2. These are fleshy and look as though they have a little more depth. There is no sinus tracts. On the right there is a superficial area but no depth here as well No surrounding erythema no purulence. Electronic Signature(s) Signed: 05/06/2020 4:32:00 PM By: [name] Entered By: [name] on 05/06/2020 16:25:42 -------------------------------------------------------------------------------- Physician Orders Details Patient Name: Date of Service: [name], Theresa 05/06/2020 3:00 PM Medical Record Number: [id number] Patient Account Number: [id number] Date of Birth/Sex: Treating RN: 1961-02-23 (60 y.o. [name] Primary Care Provider: [name] Other Clinician: Referring Provider: Treating Provider/Extender: [name] in Treatment: 4 Verbal / Phone Orders: No Diagnosis Coding Follow-up Appointments Return appointment in 3 weeks. Bathing/ Shower/ Hygiene May shower and wash wound with soap and water. Non Wound Condition Other Non Wound Condition Orders/Instructions: - 1 probiotic tablet per day, Wound Treatment Wound #1 - Groin Wound Laterality: Right Prim Dressing: Maxorb Extra Calcium Alginate Dressing, 4x4 in 1 x Per Day/30 Days ary Discharge Instructions: Apply calcium alginate to wound bed as instructed Secondary Dressing: Woven  Gauze Sponge, Non-Sterile 4x4 in (Generic) 1 x Per Day/30 Days Discharge Instructions: Apply over primary dressing as directed. Secured With: 78M Medipore H Soft Cloth Surgical Tape, 2x2 (in/yd) (Generic) 1 x Per Day/30 Days Discharge Instructions: Secure dressing with tape as directed. Wound #2 -  Groin Wound Laterality: Left, Proximal Prim Dressing: KerraCel Ag Gelling Fiber Dressing, 4x5 in (silver alginate) (Dispense As Written) 1 x Per Day/30 Days ary Discharge Instructions: Apply silver alginate to wound bed as instructed Prim Dressing: Maxorb Extra Calcium Alginate Dressing, 4x4 in 1 x Per Day/30 Days ary Discharge Instructions: Apply calcium alginate to wound bed as instructed Secondary Dressing: Woven Gauze Sponge, Non-Sterile 4x4 in (Generic) 1 x Per Day/30 Days Discharge Instructions: Apply over primary dressing as directed. Secured With: 36M Medipore H Soft Cloth Surgical Tape, 2x2 (in/yd) (Generic) 1 x Per Day/30 Days Discharge Instructions: Secure dressing with tape as directed. Wound #3 - Groin Wound Laterality: Left, Distal Prim Dressing: Maxorb Extra Calcium Alginate 2x2 in 1 x Per Day/30 Days ary Discharge Instructions: Apply calcium alginate to wound bed as instructed Secondary Dressing: Woven Gauze Sponge, Non-Sterile 4x4 in (Generic) 1 x Per Day/30 Days Discharge Instructions: Apply over primary dressing as directed. Secured With: 36M Medipore H Soft Cloth Surgical Tape, 2x2 (in/yd) (Generic) 1 x Per Day/30 Days Discharge Instructions: Secure dressing with tape as directed. Patient Medications llergies: tramadol, Ozempic A Notifications Medication Indication Start End hidradenits 05/07/2020 doxycycline monohydrate DOSE oral 50 mg capsule - 1 capsule oral bid for 3 weeks Electronic Signature(s) Signed: 05/06/2020 4:00:34 PM By: [name] Entered By: [name] on 05/06/2020  16:00:33 -------------------------------------------------------------------------------- Problem List Details Patient Name: Date of Service: [name], [name] 05/06/2020 3:00 PM Medical Record Number: [id number] Patient Account Number: [id number] Date of Birth/Sex: Treating RN: 10-29-1960 (60 y.o. 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 [name] Primary Care Provider: [name] Other Clinician: Referring Provider: Treating Provider/Extender: [name] in Treatment: 4 Subjective History of Present Illness (HPI) ADMISSION 04/08/2020 This is a pleasant 60 year old woman who is a type II diabetic relatively poorly controlled with a recent hemoglobin A1c of 10.9. However she has a prolonged history of hidradenitis suppurativa diagnosed sometime before 2012. She had surgery by Dr. Darene Lamer removing the sweat glands in the left axilla at that time. oth Some time thereafter she developed areas in her left groin area. These have waxed and waned but never really healed. She also concerned that she had areas in the right groin area. She  has only been applying Neosporin to these areas Past medical history includes type 2 diabetes, pulmonary nodules, left pontine CVA, surgery to the left axilla in 2012, hypertension and hypercholesterolemia 2/11; patient's growing actually looks better. I would like to extend the doxycycline continue with the silver alginate. She is complaining of vaginal itching with white drainage 2/25; left groin 2 wounds as well as superficial areas on the right. They are using silver alginate I have extended the doxycycline today but drop the dose down to 50 mg twice daily which should act as an adequate anti-inflammatory Objective Constitutional Patient is hypertensive.. Pulse regular and within target range for patient.Marland Kitchen Respirations  regular, non-labored and within target range.. Temperature is normal and within the target range for the patient.Marland Kitchen Appears in no distress. Vitals Time Taken: 3:17 PM, Height: 63 in, Weight: 272 lbs, BMI: 48.2, Temperature: 98.1 F, Pulse: 86 bpm, Respiratory Rate: 17 breaths/min, Blood Pressure: 159/74 mmHg, Capillary Blood Glucose: 136 mg/dl. General Notes: Wound exam; 3 small open areas now down to 2. These are fleshy and look as though they have a little more depth. There is no sinus tracts. ooOn the right there is a superficial area but no depth here as well ooNo surrounding erythema no purulence. Integumentary (Hair, Skin) Wound #1 status is Open. Original cause of wound was Gradually Appeared. The date acquired was: 03/13/2015. The wound has been in treatment 4 weeks. The wound is located on the Right Groin. The wound measures 1cm length x 0.2cm width x 0.1cm depth; 0.157cm^2 area and 0.016cm^3 volume. There is Fat Layer (Subcutaneous Tissue) exposed. There is no tunneling or undermining noted. There is a medium amount of serosanguineous drainage noted. The wound margin is flat and intact. There is large (67-100%) pink granulation within the wound bed. There is no necrotic  tissue within the wound bed. Wound #2 status is Open. Original cause of wound was Gradually Appeared. The date acquired was: 03/13/2015. The wound has been in treatment 4 weeks. The wound is located on the Left,Proximal Groin. The wound measures 0.6cm length x 1cm width x 0.2cm depth; 0.471cm^2 area and 0.094cm^3 volume. There is Fat Layer (Subcutaneous Tissue) exposed. There is no tunneling or undermining noted. There is a medium amount of serosanguineous drainage noted. The wound margin is flat and intact. There is large (67-100%) pink granulation within the wound bed. There is no necrotic tissue within the wound bed. Wound #3 status is Open. Original cause of wound was Gradually Appeared. The date acquired was: 03/13/2015. The wound has been in treatment 4 weeks. The wound is located on the Left,Distal Groin. The wound measures 0.6cm length x 0.2cm width x 0.1cm depth; 0.094cm^2 area and 0.009cm^3 volume. There is Fat Layer (Subcutaneous Tissue) exposed. There is no tunneling or undermining noted. There is a medium amount of serosanguineous drainage noted. The wound margin is flat and intact. There is large (67-100%) pink granulation within the wound bed. There is no necrotic tissue within the wound bed. Assessment Active Problems ICD-10 Hidradenitis suppurativa Non-pressure chronic ulcer of skin of other sites with other specified severity Plan Follow-up Appointments: Return appointment in 3 weeks. Bathing/ Shower/ Hygiene: May shower and wash wound with soap and water. Non Wound Condition: Other Non Wound Condition Orders/Instructions: - 1 probiotic tablet per day, The following medication(s) was prescribed: doxycycline monohydrate oral 50 mg capsule 1 capsule oral bid for 3 weeks for hidradenits starting 05/07/2020 WOUND #1: - Groin Wound Laterality: Right Prim Dressing: Maxorb Extra Calcium Alginate Dressing, 4x4 in 1 x Per Day/30 Days ary Discharge Instructions: Apply calcium alginate to  wound bed as instructed Secondary Dressing: Woven Gauze Sponge, Non-Sterile 4x4 in (Generic) 1 x Per Day/30 Days Discharge Instructions: Apply over primary dressing as directed. Secured With: 13M Medipore H Soft Cloth Surgical T ape, 2x2 (in/yd) (Generic) 1 x Per Day/30 Days Discharge Instructions: Secure dressing with tape as directed. WOUND #2: - Groin Wound Laterality: Left, Proximal Prim Dressing: KerraCel Ag Gelling Fiber Dressing, 4x5 in (silver alginate) (Dispense As Written) 1 x Per Day/30 Days ary Discharge Instructions: Apply silver alginate to wound bed as instructed Prim Dressing: Maxorb Extra Calcium Alginate Dressing,  4x4 in 1 x Per Day/30 Days ary Discharge Instructions: Apply calcium alginate to wound bed as instructed Secondary Dressing: Woven Gauze Sponge, Non-Sterile 4x4 in (Generic) 1 x Per Day/30 Days Discharge Instructions: Apply over primary dressing as directed. Secured With: 15M Medipore H Soft Cloth Surgical T ape, 2x2 (in/yd) (Generic) 1 x Per Day/30 Days Discharge Instructions: Secure dressing with tape as directed. WOUND #3: - Groin Wound Laterality: Left, Distal Prim Dressing: Maxorb Extra Calcium Alginate 2x2 in 1 x Per Day/30 Days ary Discharge Instructions: Apply calcium alginate to wound bed as instructed Secondary Dressing: Woven Gauze Sponge, Non-Sterile 4x4 in (Generic) 1 x Per Day/30 Days Discharge Instructions: Apply over primary dressing as directed. Secured With: 15M Medipore H Soft Cloth Surgical T ape, 2x2 (in/yd) (Generic) 1 x Per Day/30 Days Discharge Instructions: Secure dressing with tape as directed. 1. I am going to use silver alginate again. 2. Continuing doxycycline at 50 twice daily for 3 weeks 3. If this worsens she may need to see dermatology Electronic Signature(s) Signed: 05/06/2020 4:32:00 PM By: [name] Entered By: [name] on 05/06/2020  16:26:31 -------------------------------------------------------------------------------- SuperBill Details Patient Name: Date of Service: [facility] [name], [place] 05/06/2020 Medical Record Number: [id number] Patient Account Number: [id number] Date of Birth/Sex: Treating RN: [birth year] (59 y.o. 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

## 2020-05-06 NOTE — Progress Notes (Addendum)
CHERIL, SLATTERY ([id number]) Visit Report for 05/06/2020 Arrival Information Details Patient Name: Date of Service: Verdie Shire N[place]ty Cords 05/06/2020 3:00 PM Medical Record Number: [id number] Patient Account Number: [id number] Date of Birth/Sex: Treating RN: [birth year] (60 y.o. Martyn Malay, Linda Primary Care Provider: [name] Other Clinician: Referring Provider: Treating Provider/Extender: [name] in Treatment: 4 Visit Information History Since Last Visit Added or deleted any medications: No Patient Arrived: Ambulatory Any new allergies or adverse reactions: No Arrival Time: 15:16 Had a fall or experienced change in No Accompanied By: self activities of daily living that may affect Transfer Assistance: None risk of falls: Patient Identification Verified: Yes Signs or symptoms of abuse/neglect since last visito No Secondary Verification Process Completed: Yes Hospitalized since last visit: No Patient Requires Transmission-Based Precautions: No Implantable device outside of the clinic excluding No Patient Has Alerts: No cellular tissue based products placed in the center since last visit: Has Dressing in Place as Prescribed: Yes Pain Present Now: No Electronic Signature(s) Signed: 05/06/2020 4:34:48 PM By: Sandre Kitty Entered By: Sandre Kitty on 05/06/2020 15:17:49 -------------------------------------------------------------------------------- Clinic Level of Care Assessment Details Patient Name: Date of Service: [facility] [place], [place] C[place] 05/06/2020 3:00 PM Medical Record Number: [id number] Patient Account Number: [id number] Date of Birth/Sex: Treating RN: 1961-02-26 (60 y.o. [name] Primary Care Provider: [name] Other Clinician: Referring Provider: Treating Provider/Extender: [name] in Treatment: 4 Clinic Level of Care Assessment Items TOOL 4 Quantity Score []  - 0 Use when only  an EandM is performed on FOLLOW-UP visit ASSESSMENTS - Nursing Assessment / Reassessment X- 1 10 Reassessment of Co-morbidities (includes updates in patient status) X- 1 5 Reassessment of Adherence to Treatment Plan ASSESSMENTS - Wound and Skin A ssessment / Reassessment []  - 0 Simple Wound Assessment / Reassessment - one wound X- 3 5 Complex Wound Assessment / Reassessment - multiple wounds []  - 0 Dermatologic / Skin Assessment (not related to wound area) ASSESSMENTS - Focused Assessment []  - 0 Circumferential Edema Measurements - multi extremities []  - 0 Nutritional Assessment / Counseling / Intervention []  - 0 Lower Extremity Assessment (monofilament, tuning fork, pulses) []  - 0 Peripheral Arterial Disease Assessment (using hand held doppler) ASSESSMENTS - Ostomy and/or Continence Assessment and Care []  - 0 Incontinence Assessment and Management []  - 0 Ostomy Care Assessment and Management (repouching, etc.) PROCESS - Coordination of Care X - Simple Patient / Family Education for ongoing care 1 15 []  - 0 Complex (extensive) Patient / Family Education for ongoing care X- 1 10 Staff obtains Programmer, systems, Records, T Results / Process Orders est []  - 0 Staff telephones HHA, Nursing Homes / Clarify orders / etc []  - 0 Routine Transfer to another Facility (non-emergent condition) []  - 0 Routine Hospital Admission (non-emergent condition) []  - 0 New Admissions / Biomedical engineer / Ordering NPWT Apligraf, etc. , []  - 0 Emergency Hospital Admission (emergent condition) X- 1 10 Simple Discharge Coordination []  - 0 Complex (extensive) Discharge Coordination PROCESS - Special Needs []  - 0 Pediatric / Minor Patient Management []  - 0 Isolation Patient Management []  - 0 Hearing / Language / Visual special needs []  - 0 Assessment of Community assistance (transportation, D/C planning, etc.) []  - 0 Additional assistance / Altered mentation []  - 0 Support Surface(s)  Assessment (bed, cushion, seat, etc.) INTERVENTIONS - Wound Cleansing / Measurement []  - 0 Simple Wound Cleansing - one wound X- 3 5 Complex Wound Cleansing - multiple wounds X- 1 5 Wound Imaging (  photographs - any number of wounds) []  - 0 Wound Tracing (instead of photographs) []  - 0 Simple Wound Measurement - one wound X- 3 5 Complex Wound Measurement - multiple wounds INTERVENTIONS - Wound Dressings X - Small Wound Dressing one or multiple wounds 1 10 []  - 0 Medium Wound Dressing one or multiple wounds []  - 0 Large Wound Dressing one or multiple wounds X- 1 5 Application of Medications - topical []  - 0 Application of Medications - injection INTERVENTIONS - Miscellaneous []  - 0 External ear exam []  - 0 Specimen Collection (cultures, biopsies, blood, body fluids, etc.) []  - 0 Specimen(s) / Culture(s) sent or taken to Lab for analysis []  - 0 Patient Transfer (multiple staff / Civil Service fast streamer / Similar devices) []  - 0 Simple Staple / Suture removal (25 or less) []  - 0 Complex Staple / Suture removal (26 or more) []  - 0 Hypo / Hyperglycemic Management (close monitor of Blood Glucose) []  - 0 Ankle / Brachial Index (ABI) - do not check if billed separately X- 1 5 Vital Signs Has the patient been seen at the hospital within the last three years: Yes Total Score: 120 Level Of Care: New/Established - Level 4 Electronic Signature(s) Signed: 05/06/2020 4:54:48 PM By: Baruch Gouty RN, BSN Entered By: Baruch Gouty on 05/06/2020 15:57:29 -------------------------------------------------------------------------------- Encounter Discharge Information Details Patient Name: Date of Service: [place], [facility] 05/06/2020 3:00 PM Medical Record Number: [id number] Patient Account Number: [id number] Date of Birth/Sex: Treating RN: 02-22-1961 (60 y.o. Debby Bud Primary Care Provider: [name] Other Clinician: Referring Provider: Treating Provider/Extender: [name] in Treatment: 4 Encounter Discharge Information Items Discharge Condition: Stable Ambulatory Status: Ambulatory Discharge Destination: Home Transportation: Private Auto Accompanied By: self Schedule Follow-up Appointment: Yes Clinical Summary of Care: Electronic Signature(s) Signed: 05/06/2020 5:07:55 PM By: Deon Pilling Entered By: Deon Pilling on 05/06/2020 17:07:11 -------------------------------------------------------------------------------- Lower Extremity Assessment Details Patient Name: Date of Service: [facility] [place], [place] 05/06/2020 3:00 PM Medical Record Number: [id number] Patient Account Number: [id number] Date of Birth/Sex: Treating RN: July 03, 1960 (60 y.o. Debby Bud Primary Care Provider: [name] Other Clinician: Referring Provider: Treating Provider/Extender: [name] in Treatment: 4 Electronic Signature(s) Signed: 05/06/2020 5:07:55 PM By: Deon Pilling Entered By: Deon Pilling on 05/06/2020 15:34:45 -------------------------------------------------------------------------------- Multi Wound Chart Details Patient Name: Date of Service: Verdie Shire [place], [place] 05/06/2020 3:00 PM Medical Record Number: [id number] Patient Account Number: [id number] Date of Birth/Sex: Treating RN: [birth year] (60 y.o. [name] Primary Care Provider: [name] Other Clinician: Referring Provider: Treating Provider/Extender: [name] in Treatment: 4 Vital Signs Height(in): 63 Capillary Blood Glucose(mg/dl): 136 Weight(lbs): 272 Pulse(bpm): 42 Body Mass Index(BMI): 47 Blood Pressure(mmHg): 159/74 Temperature(F): 98.1 Respiratory Rate(breaths/min): 17 Photos: [1:No Photos Right Groin] [2:No Photos Left, Proximal Groin] [3:No Photos Left, Distal Groin] Wound Location: [1:Gradually Appeared] [2:Gradually Appeared] [3:Gradually Appeared] Wounding Event:  [1:Hidradenitis] [2:Hidradenitis] [3:Hidradenitis] Primary Etiology: [1:Hypertension, Type II Diabetes,] [2:Hypertension, Type II Diabetes,] [3:Hypertension, Type II Diabetes,] Comorbid History: [1:Osteoarthritis 03/13/2015] [2:Osteoarthritis 03/13/2015] [3:Osteoarthritis 03/13/2015] Date Acquired: [1:4] [2:4] [3:4] Weeks of Treatment: [1:Open] [2:Open] [3:Open] Wound Status: [1:No] [2:Yes] [3:Yes] Clustered Wound: [1:N/A] [2:1] [3:2] Clustered Quantity: [1:1x0.2x0.1] [2:0.6x1x0.2] [3:0.6x0.2x0.1] Measurements L x W x D (cm) [1:0.157] [2:0.471] [3:0.094] A (cm) : rea [1:0.016] [2:0.094] [3:0.009] Volume (cm) : [1:94.70%] [2:0.00%] [3:88.30%] % Reduction in Area: [1:94.60%] [2:50.00%] [3:97.80%] % Reduction in Volume: [1:Full Thickness Without Exposed] [2:Full Thickness Without Exposed] [3:Full Thickness Without Exposed] Classification: [1:Support Structures  Medium] [2:Support Structures Medium] [3:Support Structures Medium] Exudate Amount: [1:Serosanguineous] [2:Serosanguineous] [3:Serosanguineous] Exudate Type: [1:red, brown] [2:red, brown] [3:red, brown] Exudate Color: [1:Flat and Intact] [2:Flat and Intact] [3:Flat and Intact] Wound Margin: [1:Large (67-100%)] [2:Large (67-100%)] [3:Large (67-100%)] Granulation Amount: [1:Pink] [2:Pink] [3:Pink] Granulation Quality: [1:None Present (0%)] [2:None Present (0%)] [3:None Present (0%)] Necrotic Amount: [1:Fat Layer (Subcutaneous Tissue): Yes Fat Layer (Subcutaneous Tissue): Yes Fat Layer (Subcutaneous Tissue): Yes] Exposed Structures: [1:Fascia: No Tendon: No Muscle: No Joint: No Bone: No Small (1-33%)] [2:Fascia: No Tendon: No Muscle: No Joint: No Bone: No Small (1-33%)] [3:Fascia: No Tendon: No Muscle: No Joint: No Bone: No Small (1-33%)] Treatment Notes Electronic Signature(s) Signed: 05/06/2020 4:32:00 PM By: Linton Ham MD Signed: 05/06/2020 4:54:48 PM By: Baruch Gouty RN, BSN Entered By: Linton Ham on 05/06/2020  16:24:05 -------------------------------------------------------------------------------- Multi-Disciplinary Care Plan Details Patient Name: Date of Service: [facility] [place], [place] 05/06/2020 3:00 PM Medical Record Number: [id number] Patient Account Number: [id number] Date of Birth/Sex: Treating RN: [birth year] (60 y.o. [name] Primary Care Provider: [name] Other Clinician: Referring Provider: Treating Provider/Extender: [name] in Treatment: Godwin reviewed with physician Active Inactive Nutrition Nursing Diagnoses: Impaired glucose control: actual or potential Potential for alteratiion in Nutrition/Potential for imbalanced nutrition Goals: Patient/caregiver will maintain therapeutic glucose control Date Initiated: 04/08/2020 Target Resolution Date: 06/03/2020 Goal Status: Active Interventions: Assess patient nutrition upon admission and as needed per policy Provide education on elevated blood sugars and impact on wound healing Treatment Activities: Patient referred to Primary Care Physician for further nutritional evaluation : 04/08/2020 Notes: Wound/Skin Impairment Nursing Diagnoses: Impaired tissue integrity Knowledge deficit related to ulceration/compromised skin integrity Goals: Patient/caregiver will verbalize understanding of skin care regimen Date Initiated: 04/08/2020 Target Resolution Date: 06/03/2020 Goal Status: Active Ulcer/skin breakdown will have a volume reduction of 30% by week 4 Date Initiated: 04/08/2020 Target Resolution Date: 06/03/2020 Goal Status: Active Interventions: Assess patient/caregiver ability to obtain necessary supplies Assess patient/caregiver ability to perform ulcer/skin care regimen upon admission and as needed Assess ulceration(s) every visit Provide education on ulcer and skin care Treatment Activities: Skin care regimen initiated : 04/08/2020 Topical wound  management initiated : 04/08/2020 Notes: Electronic Signature(s) Signed: 05/06/2020 4:54:48 PM By: Baruch Gouty RN, BSN Entered By: Baruch Gouty on 05/06/2020 15:14:11 -------------------------------------------------------------------------------- Pain Assessment Details Patient Name: Date of Service: Verdie Shire [place], [place] 05/06/2020 3:00 PM Medical Record Number: [id number] Patient Account Number: [id number] Date of Birth/Sex: Treating RN: 19-Feb-1961 (60 y.o. [name] Primary Care Provider: [name] Other Clinician: Referring Provider: Treating Provider/Extender: [name] in Treatment: 4 Active Problems Location of Pain Severity and Description of Pain Patient Has Paino No Site Locations Pain Management and Medication Current Pain Management: Electronic Signature(s) Signed: 05/06/2020 4:34:48 PM By: Sandre Kitty Signed: 05/06/2020 4:54:48 PM By: Baruch Gouty RN, BSN Entered By: Sandre Kitty on 05/06/2020 15:18:18 -------------------------------------------------------------------------------- Patient/Caregiver Education Details Patient Name: Date of Service: [facility] [place], JUA NITA 2/25/2022andnbsp3:00 PM Medical Record Number: [id number] Patient Account Number: [id number] Date of Birth/Gender: Treating RN: Feb 13, 1961 (60 y.o. [name] Primary Care Physician: [name] Other Clinician: Referring Physician: Treating Physician/Extender: [name] in Treatment: 4 Education Assessment Education Provided To: Patient Education Topics Provided Elevated Blood Sugar/ Impact on Healing: Methods: Explain/Verbal Responses: Reinforcements needed, State content correctly Wound/Skin Impairment: Methods: Explain/Verbal Responses: Reinforcements needed, State content correctly Electronic Signature(s) Signed: 05/06/2020 4:54:48 PM By: Baruch Gouty RN, BSN Entered By: Baruch Gouty on 05/06/2020  15:14:55 -------------------------------------------------------------------------------- Wound Assessment Details Patient Name: Date of Service: [facility] 05/06/2020 3:00 PM Medical Record Number: [id number] Patient Account Number: [id number] Date of Birth/Sex: Treating RN: April 24, 1960 (60 y.o. [name] Primary Care Provider: [name] Other Clinician: Referring Provider: Treating Provider/Extender: [name] in Treatment: 4 Wound Status Wound Number: 1 Primary Etiology: Hidradenitis Wound Location: Right Groin Wound Status: Open Wounding Event: Gradually Appeared Comorbid History: Hypertension, Type II Diabetes, Osteoarthritis Date Acquired: 03/13/2015 Weeks Of Treatment: 4 Clustered Wound: No Photos Wound Measurements Length: (cm) 1 Width: (cm) 0.2 Depth: (cm) 0.1 Area: (cm) 0.157 Volume: (cm) 0.016 % Reduction in Area: 94.7% % Reduction in Volume: 94.6% Epithelialization: Small (1-33%) Tunneling: No Undermining: No Wound Description Classification: Full Thickness Without Exposed Support Structures Wound Margin: Flat and Intact Exudate Amount: Medium Exudate Type: Serosanguineous Exudate Color: red, brown Foul Odor After Cleansing: No Slough/Fibrino No Wound Bed Granulation Amount: Large (67-100%) Exposed Structure Granulation Quality: Pink Fascia Exposed: No Necrotic Amount: None Present (0%) Fat Layer (Subcutaneous Tissue) Exposed: Yes Tendon Exposed: No Muscle Exposed: No Joint Exposed: No Bone Exposed: No Treatment Notes Wound #1 (Groin) Wound Laterality: Right Cleanser Peri-Wound Care Topical Primary Dressing Maxorb Extra Calcium Alginate Dressing, 4x4 in Discharge Instruction: Apply calcium alginate to wound bed as instructed Secondary Dressing Woven Gauze Sponge, Non-Sterile 4x4 in Discharge Instruction: Apply over primary dressing as directed. Secured With 65M [place] [facility] T ape, 2x2 (in/yd) Discharge Instruction: Secure dressing with tape as directed. Compression Wrap Compression Stockings Add-Ons Electronic Signature(s) Signed: 05/10/2020 8:29:17 AM By: Sandre Kitty Signed: 05/10/2020 5:57:00 PM By: Baruch Gouty RN, BSN Previous Signature: 05/06/2020 4:54:48 PM Version By: Baruch Gouty RN, BSN Previous Signature: 05/06/2020 5:07:55 PM Version By: Deon Pilling Entered By: Sandre Kitty on 05/09/2020 15:21:15 -------------------------------------------------------------------------------- Wound Assessment Details Patient Name: Date of Service: [place], [place] 05/06/2020 3:00 PM Medical Record Number: [id number] Patient Account Number: [id number] Date of Birth/Sex: Treating RN: 01/08/1961 (60 y.o. [name] Primary Care Provider: [name] Other Clinician: Referring Provider: Treating Provider/Extender: [name] in Treatment: 4 Wound Status Wound Number: 2 Primary Etiology: Hidradenitis Wound Location: Left, Proximal Groin Wound Status: Open Wounding Event: Gradually Appeared Comorbid History: Hypertension, Type II Diabetes, Osteoarthritis Date Acquired: 03/13/2015 Weeks Of Treatment: 4 Clustered Wound: Yes Photos Wound Measurements Length: (cm) 0.6 Width: (cm) 1 Depth: (cm) 0.2 Clustered Quantity: 1 Area: (cm) 0.471 Volume: (cm) 0.094 % Reduction in Area: 0% % Reduction in Volume: 50% Epithelialization: Small (1-33%) Tunneling: No Undermining: No Wound Description Classification: Full Thickness Without Exposed Support Structures Wound Margin: Flat and Intact Exudate Amount: Medium Exudate Type: Serosanguineous Exudate Color: red, brown Foul Odor After Cleansing: No Slough/Fibrino No Wound Bed Granulation Amount: Large (67-100%) Exposed Structure Granulation Quality: Pink Fascia Exposed: No Necrotic Amount: None Present (0%) Fat Layer (Subcutaneous  Tissue) Exposed: Yes Tendon Exposed: No Muscle Exposed: No Joint Exposed: No Bone Exposed: No Treatment Notes Wound #2 (Groin) Wound Laterality: Left, Proximal Cleanser Peri-Wound Care Topical Primary Dressing KerraCel Ag Gelling Fiber Dressing, 4x5 in (silver alginate) Discharge Instruction: Apply silver alginate to wound bed as instructed Maxorb Extra Calcium Alginate Dressing, 4x4 in Discharge Instruction: Apply calcium alginate to wound bed as instructed Secondary Dressing Woven Gauze Sponge, Non-Sterile 4x4 in Discharge Instruction: Apply over primary dressing as directed. Secured With 65M [place] [facility] T ape, 2x2 (in/yd) Discharge Instruction: Secure dressing with tape as directed. Compression Wrap Compression  Stockings Environmental education officer) Signed: 05/10/2020 8:29:17 AM By: Sandre Kitty Signed: 05/10/2020 5:57:00 PM By: Baruch Gouty RN, BSN Previous Signature: 05/06/2020 4:54:48 PM Version By: Baruch Gouty RN, BSN Previous Signature: 05/06/2020 5:07:55 PM Version By: Deon Pilling Entered By: Sandre Kitty on 05/09/2020 15:19:40 -------------------------------------------------------------------------------- Wound Assessment Details Patient Name: Date of Service: [place], [place] 05/06/2020 3:00 PM Medical Record Number: [id number] Patient Account Number: [id number] Date of Birth/Sex: Treating RN: 1960/09/30 (60 y.o. [name] Primary Care Provider: [name] Other Clinician: Referring Provider: Treating Provider/Extender: [name] in Treatment: 4 Wound Status Wound Number: 3 Primary Etiology: Hidradenitis Wound Location: Left, Distal Groin Wound Status: Open Wounding Event: Gradually Appeared Comorbid History: Hypertension, Type II Diabetes, Osteoarthritis Date Acquired: 03/13/2015 Weeks Of Treatment: 4 Clustered Wound: Yes Photos Wound Measurements Length: (cm) 0.6 Width:  (cm) 0.2 Depth: (cm) 0.1 Clustered Quantity: 2 Area: (cm) 0.094 Volume: (cm) 0.009 % Reduction in Area: 88.3% % Reduction in Volume: 97.8% Epithelialization: Small (1-33%) Tunneling: No Undermining: No Wound Description Classification: Full Thickness Without Exposed Support Structures Wound Margin: Flat and Intact Exudate Amount: Medium Exudate Type: Serosanguineous Exudate Color: red, brown Foul Odor After Cleansing: No Slough/Fibrino No Wound Bed Granulation Amount: Large (67-100%) Exposed Structure Granulation Quality: Pink Fascia Exposed: No Necrotic Amount: None Present (0%) Fat Layer (Subcutaneous Tissue) Exposed: Yes Tendon Exposed: No Muscle Exposed: No Joint Exposed: No Bone Exposed: No Treatment Notes Wound #3 (Groin) Wound Laterality: Left, Distal Cleanser Peri-Wound Care Topical Primary Dressing Maxorb Extra Calcium Alginate 2x2 in Discharge Instruction: Apply calcium alginate to wound bed as instructed Secondary Dressing Woven Gauze Sponge, Non-Sterile 4x4 in Discharge Instruction: Apply over primary dressing as directed. Secured With 17M [place] [facility] T ape, 2x2 (in/yd) Discharge Instruction: Secure dressing with tape as directed. Compression Wrap Compression Stockings Add-Ons Electronic Signature(s) Signed: 05/10/2020 8:29:17 AM By: Sandre Kitty Signed: 05/10/2020 5:57:00 PM By: Baruch Gouty RN, BSN Previous Signature: 05/06/2020 4:54:48 PM Version By: Baruch Gouty RN, BSN Previous Signature: 05/06/2020 5:07:55 PM Version By: Deon Pilling Entered By: Sandre Kitty on 05/09/2020 15:20:23 -------------------------------------------------------------------------------- Vitals Details Patient Name: Date of Service: [place], [place] 05/06/2020 3:00 PM Medical Record Number: [id number] Patient Account Number: [id number] Date of Birth/Sex: Treating RN: 05/12/1960 (60 y.o. [name] Primary Care Provider:  [name] Other Clinician: Referring Provider: Treating Provider/Extender: [name] in Treatment: 4 Vital Signs Time Taken: 15:17 Temperature (F): 98.1 Height (in): 63 Pulse (bpm): 86 Weight (lbs): 272 Respiratory Rate (breaths/min): 17 Body Mass Index (BMI): 48.2 Blood Pressure (mmHg): 159/74 Capillary Blood Glucose (mg/dl): 136 Reference Range: 80 - 120 mg / dl Electronic Signature(s) Signed: 05/06/2020 4:34:48 PM By: Sandre Kitty Entered By: Sandre Kitty on 05/06/2020 15:18:13

## 2020-05-09 ENCOUNTER — Other Ambulatory Visit (INDEPENDENT_AMBULATORY_CARE_PROVIDER_SITE_OTHER): Payer: BC Managed Care – PPO

## 2020-05-09 ENCOUNTER — Ambulatory Visit (INDEPENDENT_AMBULATORY_CARE_PROVIDER_SITE_OTHER): Payer: BC Managed Care – PPO | Admitting: Internal Medicine

## 2020-05-09 ENCOUNTER — Encounter: Payer: Self-pay | Admitting: Internal Medicine

## 2020-05-09 VITALS — BP 128/62 | HR 84 | Ht 63.0 in | Wt 267.6 lb

## 2020-05-09 DIAGNOSIS — R1011 Right upper quadrant pain: Secondary | ICD-10-CM

## 2020-05-09 DIAGNOSIS — K219 Gastro-esophageal reflux disease without esophagitis: Secondary | ICD-10-CM | POA: Diagnosis not present

## 2020-05-09 DIAGNOSIS — R748 Abnormal levels of other serum enzymes: Secondary | ICD-10-CM

## 2020-05-09 DIAGNOSIS — Z86010 Personal history of colonic polyps: Secondary | ICD-10-CM | POA: Diagnosis not present

## 2020-05-09 LAB — HEPATIC FUNCTION PANEL
ALT: 14 U/L (ref 0–35)
AST: 11 U/L (ref 0–37)
Albumin: 3.7 g/dL (ref 3.5–5.2)
Alkaline Phosphatase: 143 U/L — ABNORMAL HIGH (ref 39–117)
Bilirubin, Direct: 0 mg/dL (ref 0.0–0.3)
Total Bilirubin: 0.3 mg/dL (ref 0.2–1.2)
Total Protein: 6.7 g/dL (ref 6.0–8.3)

## 2020-05-09 LAB — PROTIME-INR
INR: 1 ratio (ref 0.8–1.0)
Prothrombin Time: 11.4 s (ref 9.6–13.1)

## 2020-05-09 LAB — GAMMA GT: GGT: 47 U/L (ref 7–51)

## 2020-05-09 NOTE — Patient Instructions (Signed)
Your provider has requested that you go to the basement level for lab work before leaving today. Press "B" on the elevator. The lab is located at the first door on the left as you exit the elevator.  You have been scheduled for an abdominal ultrasound at [facility] (1st floor of hospital) on 05/13/20 at 9:30am. Please arrive 15 minutes prior to your appointment for registration. Make certain not to have anything to eat or drink 6 hours prior to your appointment. Should you need to reschedule your appointment, please contact radiology at [phone]. This test typically takes about 30 minutes to perform.   Due to recent changes in healthcare laws, you may see the results of your imaging and laboratory studies on MyChart before your provider has had a chance to review them.  We understand that in some cases there may be results that are confusing or concerning to you. Not all laboratory results come back in the same time frame and the provider may be waiting for multiple results in order to interpret others.  Please give [place] 48 hours in order for your provider to thoroughly review all the results before contacting the office for clarification of your results.    If you are age 54 or younger, your body mass index should be between 19-25. Your Body mass index is 47.4 kg/m. If this is out of the aformentioned range listed, please consider follow up with your Primary Care Provider.    Thank you for choosing me and [facility].  Dr. Ulice Dash Pyrtle

## 2020-05-09 NOTE — Progress Notes (Signed)
Patient ID: [name], female   DOB: 07/12/1960, 60 y.o.   MRN: [id number] HPI: [name] is a 60 year old female with a past medical history of adenomatous and hyperplastic colon polyps, GERD, gallstones status post cholecystectomy, hypertension, hyperlipidemia, sleep apnea chronic pain who is seen in consultation at the request of Dorothea Ogle, PA-C to evaluate elevated alkaline phosphatase.  She is here alone today.  She is known to me from her screening colonoscopy which was performed on 10/08/2016.  This revealed 8 polyps removed from the colon mostly in the left colon and rectum.  The range in size of these polyps was 2 to 5 mm.  There was diverticulosis in the sigmoid and small internal hemorrhoids.  1 of these polyps was found to be adenomatous the others hyperplastic.  5-year recall was recommended at that time.  She reports that over the last year or so she has been told that her alkaline phosphatase was elevated.  She has had several lab tests checked which show this to be the case.  She has had an aching pain in her right upper quadrant over the last 6 weeks.  This radiates into the right middle and at times right lower quadrant.  Does not seem to be affected by eating.  There is no associated nausea or vomiting.  Appetite has been normal for her.  It is a low level pain and not severe and aching in nature.  Does not seem to be affected by activity and can occur even when she is lying in bed.  Her reflux and heartburn are well controlled on Dexilant 60 mg a day.  Bowel movements are mostly regular for her but she can be prone to constipation.  MiraLAX helps with this when needed.  She will normally have a bowel movement every 1.5 to 2 days.  No blood in stool or melena.  She is being treated for a boil in her left groin.  She has completed 4 weeks of doxycycline with some improvement though that therapy has been extended for another 2 weeks.  The plan from the wound clinic  would be to consider hyperbaric oxygen if this lesion fails to heal completely.  She has a family history of cirrhosis in her father who died of the same at age 36.  She recalls that he was a heavy drinker.  She personally does not drink alcohol nor use tobacco.  She does not recall other than the doxycycline any new medications.  There is no itching.  Past Medical History:  Diagnosis Date   Abscess    increased drainage from abscess on buttock   Anal fistula    Anxiety    Bilateral hip pain 05/27/2015   Chronic pain syndrome 05/27/2015   Depression    sees [name]   Diabetes mellitus without complication [facility])    Diverticulosis    Elevated alkaline phosphatase level    Fatty liver    Gallstones    Hiatal hernia    Hyperlipidemia    Hypertension    Internal hemorrhoids    Sleep apnea    2008- sleep study, neg. for sleep apnea    Stroke [facility])    SVT (supraventricular tachycardia) ([place])    Symptomatic cholelithiasis 09/11/2018    Past Surgical History:  Procedure Laterality Date   ANAL EXAMINATION UNDER ANESTHESIA  02/21/11   anal fistula   BREAST SURGERY  patient does not remember date of procedure   pull fluid off lft br  CHOLECYSTECTOMY N/A 09/12/2018   Procedure: LAPAROSCOPIC CHOLECYSTECTOMY;  Surgeon: Stark Klein, MD;  Location: [place];  Service: General;  Laterality: N/A;   ELECTROPHYSIOLOGIC STUDY N/A 05/05/2015   Procedure: SVT Ablation;  Surgeon: Will Meredith Leeds, MD;  Location: [place] [facility];  Service: Cardiovascular;  Laterality: N/A;   EP IMPLANTABLE DEVICE N/A 01/30/2016   Procedure: Loop Recorder Insertion;  Surgeon: Sanda Klein, MD;  Location: [place] [facility];  Service: Cardiovascular;  Laterality: N/A;   INCISE AND DRAIN ABCESS     abscess on right thigh and buttock   KNEE ARTHROSCOPY     left   LAPAROSCOPIC APPENDECTOMY N/A 09/19/2018   Procedure: APPENDECTOMY LAPAROSCOPIC;  Surgeon: Clovis Riley, MD;   Location: [place];  Service: General;  Laterality: N/A;   SHOULDER SURGERY  04/14/09   right    Outpatient Medications Prior to Visit  Medication Sig Dispense Refill   aspirin 81 MG EC tablet Take 1 tablet (81 mg total) by mouth daily. 90 tablet 3   calcium carbonate (CALCIUM 600) 600 MG TABS tablet Take 1 tablet (600 mg total) by mouth 2 (two) times daily with a meal. 180 tablet 3   carvedilol (COREG) 6.25 MG tablet Take 1 tablet (6.25 mg total) by mouth 2 (two) times daily. 180 tablet 3   dexlansoprazole (DEXILANT) 60 MG capsule Take 1 capsule (60 mg total) by mouth daily. 90 capsule 3   diclofenac (VOLTAREN) 50 MG EC tablet Take 1 tablet by mouth twice daily 180 tablet 1   doxycycline (VIBRAMYCIN) 100 MG capsule Take 100 mg by mouth 2 (two) times daily.     DULoxetine (CYMBALTA) 60 MG capsule Take 1 capsule by mouth twice daily 180 capsule 0   FIASP FLEXTOUCH 100 UNIT/ML FlexTouch Pen SMARTSIG: 28 Unit(s) SUB-Q 3 Times Daily 9 mL 1   furosemide (LASIX) 80 MG tablet Take 1 tablet (80 mg total) by mouth daily. 90 tablet 3   glucose blood test strip ONE TOUCH VERIO TEST STRIPS TO TEST 1-2 TIMES A DAY 100 each 12   HYDROcodone-acetaminophen (NORCO) 10-325 MG tablet Take 1 tablet by mouth every 6 (six) hours as needed. Please do not  Fill before 05/29/2020 120 tablet 0   Insulin Glargine (BASAGLAR KWIKPEN) 100 UNIT/ML Inject 100 Units into the skin every morning. And pen needles 1/day 30 mL 1   nortriptyline (PAMELOR) 25 MG capsule Take 1 capsule by mouth at bedtime 90 capsule 1   potassium chloride (KLOR-CON) 10 MEQ tablet Take 1 tablet (10 mEq total) by mouth daily. 90 tablet 3   rosuvastatin (CRESTOR) 20 MG tablet Take 1 tablet (20 mg total) by mouth daily. 90 tablet 3   Vitamin D, Ergocalciferol, (DRISDOL) 1.25 MG (50000 UNIT) CAPS capsule Take 1 capsule (50,000 Units total) by mouth every 7 (seven) days. 4.5 capsule 1   No facility-administered medications prior to visit.     Allergies  Allergen Reactions   Ozempic (0.25 Or 0.5 Mg-Dose) [Semaglutide(0.25 Or 0.13m-Dos)]     Severe nausea, bad gas   Tramadol Nausea Only    Family History  Problem Relation Age of Onset   Hypertension Mother    Alzheimer's disease Mother    Diabetes Father    Breast cancer Sister 448  Anesthesia problems Neg Hx    Hypotension Neg Hx    Malignant hyperthermia Neg Hx    Pseudochol deficiency Neg Hx    Colon cancer Neg Hx    Esophageal cancer Neg  Hx    Stomach cancer Neg Hx    Rectal cancer Neg Hx    Thyroid disease Neg Hx     Social History   Tobacco Use   Smoking status: Never Smoker   Smokeless tobacco: Never Used  Vaping Use   Vaping Use: Never used  Substance Use Topics   Alcohol use: No   Drug use: No    ROS: As per history of present illness, otherwise negative  BP 128/62    Pulse 84    Ht 5' 3"  (1.6 m)    Wt 267 lb 9.6 oz (121.4 kg)    LMP 02/09/2017    SpO2 98%    BMI 47.40 kg/m  Constitutional: Well-developed and well-nourished. No distress. HEENT: Normocephalic and atraumatic.  Conjunctivae are normal.  No scleral icterus. Neck: Neck supple. Trachea midline. Cardiovascular: Normal rate, regular rhythm and intact distal pulses. No M/R/G Pulmonary/chest: Effort normal and breath sounds normal. No wheezing, rales or rhonchi. Abdominal: Soft, obese, tender in the right upper quadrant without rebound or guarding, nondistended. Bowel sounds active throughout Extremities: no clubbing, cyanosis, or edema Neurological: Alert and oriented to person place and time. Skin: Skin is warm and dry.  Psychiatric: Normal mood and affect. Behavior is normal.  RELEVANT LABS AND IMAGING: CBC    Component Value Date/Time   WBC 12.1 (H) 04/12/2020 1002   WBC 13.8 (H) 06/08/2019 1437   RBC 5.40 (H) 04/12/2020 1002   RBC 5.54 (H) 06/08/2019 1437   HGB 13.4 04/12/2020 1002   HCT 42.8 04/12/2020 1002   PLT 374 04/12/2020 1002   MCV 79  04/12/2020 1002   MCH 24.8 (L) 04/12/2020 1002   MCH 22.6 (L) 06/08/2019 1437   MCHC 31.3 (L) 04/12/2020 1002   MCHC 29.8 (L) 06/08/2019 1437   RDW 13.4 04/12/2020 1002   LYMPHSABS 3.2 (H) 04/12/2020 1002   MONOABS 0.6 06/08/2019 1437   EOSABS 0.5 (H) 04/12/2020 1002   BASOSABS 0.1 04/12/2020 1002    CMP     Component Value Date/Time   NA 143 02/29/2020 1138   K 4.3 02/29/2020 1138   CL 107 (H) 02/29/2020 1138   CO2 24 02/29/2020 1138   GLUCOSE 73 02/29/2020 1138   GLUCOSE 469 (H) 06/08/2019 1454   BUN 15 02/29/2020 1138   CREATININE 1.01 (H) 02/29/2020 1138   CREATININE 1.15 (H) 12/12/2016 1529   CALCIUM 9.6 02/29/2020 1138   PROT 6.7 05/09/2020 1048   PROT 6.0 02/29/2020 1138   ALBUMIN 3.7 05/09/2020 1048   ALBUMIN 3.8 02/29/2020 1138   AST 11 05/09/2020 1048   ALT 14 05/09/2020 1048   ALKPHOS 143 (H) 05/09/2020 1048   BILITOT 0.3 05/09/2020 1048   BILITOT 0.3 02/29/2020 1138   GFRNONAA 61 02/29/2020 1138   GFRNONAA 48 (L) 08/23/2014 1740   GFRAA 70 02/29/2020 1138   GFRAA 55 (L) 08/23/2014 1740   Alkaline phosphatase liver fraction 51%, bone fraction 47% intestinal fraction 2%  Results for Theresa, [name] (MRN [id number]) as of 05/09/2020 12:25  Ref. Range 09/12/2018 03:43 09/12/2018 21:03 09/19/2018 06:13 04/13/2019 16:12 06/08/2019 14:37 06/08/2019 14:54 06/09/2019 03:39 07/06/2019 15:58 02/29/2020 11:38 03/16/2020 15:09 05/09/2020 10:48  Alkaline Phosphatase Latest Ref Range: 39 - 117 U/L 118  140 (H) 204 (H) 165 (H)    211 (H) 204 (H) 143 (H)    CLINICAL DATA:  Abdominal pain. RIGHT lower quadrant pain radiating to RIGHT flank. Cholecystectomy on 09/12/2018. Appendicitis suspected. Postoperative complications suspected.   EXAM:  CT ABDOMEN AND PELVIS WITH CONTRAST   TECHNIQUE: Multidetector CT imaging of the abdomen and pelvis was performed using the standard protocol following bolus administration of intravenous contrast.   CONTRAST:  133m OMNIPAQUE IOHEXOL 300  MG/ML  SOLN   COMPARISON:  CT abdomen dated 05/30/2016.   FINDINGS: Lower chest: No acute abnormality.   Hepatobiliary: No focal liver abnormality is seen. Status post cholecystectomy. Small amount of fluid within the gallbladder fossa is an expected finding status post recent cholecystectomy. No bile duct dilatation.   Pancreas: Unremarkable. No pancreatic ductal dilatation or surrounding inflammatory changes.   Spleen: Normal in size without focal abnormality.   Adrenals/Urinary Tract: LEFT adrenal fullness is stable. RIGHT adrenal gland appears normal. Kidneys are unremarkable without suspicious mass, stone or hydronephrosis. No ureteral or bladder calculi identified. Bladder appears normal, partially decompressed.   Stomach/Bowel: Appendix is slightly distended, measuring 8-9 mm diameter, with suspected wall thickening and amorphous margins, suspicious for early acute appendicitis (axial series 3, image 60; sagittal series 7, images 59 through 69; coronal series 6, images 59 through 68). Small amount of free fluid within the adjacent RIGHT lower quadrant. No appendicoliths seen.   Appendix: Location: RIGHT lower quadrant   Diameter: 8-9 mm   Appendicolith: Not Seen   Mucosal hyper-enhancement: Not seen   Extraluminal gas: Not Seen   Periappendiceal collection: No periappendiceal abscess.   No dilated large or small bowel loops. Stomach is unremarkable, partially decompressed.   Vascular/Lymphatic: Mild aortic atherosclerosis. No acute appearing vascular abnormality. No enlarged lymph nodes seen in the abdomen or pelvis.   Reproductive: Leiomyomatous uterus. Adnexal regions are unremarkable.   Other: No abscess collection seen. No free intraperitoneal air.   Musculoskeletal: No acute or suspicious osseous finding.   IMPRESSION: 1. Findings suspicious for early acute appendicitis, based on appendiceal diameter of 8-9 mm, but not definitive  without convincing periappendiceal inflammation. Of note, the appendix had a normal caliber on CT of 05/30/2016 increasing my suspicion for early acute appendicitis. No evidence for perforation or abscess formation. 2. Status post recent cholecystectomy. Small amount of fluid within the gallbladder fossa is an expected finding status post recent cholecystectomy. 3. Leiomyomatous uterus.     Electronically Signed   By: SFranki CabotM.D.   On: 09/19/2018 08:14    ASSESSMENT/PLAN: 60year old female with a past medical history of adenomatous and hyperplastic colon polyps, GERD, gallstones status post cholecystectomy, hypertension, hyperlipidemia, sleep apnea chronic pain who is seen in consultation at the request of SDorothea Ogle PA-C to evaluate elevated alkaline phosphatase.   1.  Isolated elevated alk phos/right upper quadrant pain --her alkaline phosphatase has been elevated over the past 1 to 2 years at times over 200.  Her AST, ALT and total bilirubin have remained normal.  We have discussed further evaluation of this today.  I am not convinced that her right upper quadrant pain is related but certainly cannot exclude that they are correlated at this point.  She has a family history of cirrhosis but this was alcohol induced and she does not drink alcohol.  I recommended we work this up as follows: --Right upper quadrant ultrasound --Repeat hepatic function panel today, check a 5-NT, check GGT, check INR, check AMA --Depending on the above we may consider MRCP or even liver biopsy versus observation  2.  GERD --currently well controlled with Dexilant she will continue 60 mg daily  3.  History of adenoma of the colon --nonadvanced adenoma in 2018 as above.  One of the hyperplastic polyps was right-sided so I cannot exclude a small SSP.  Certainly the distal hyperplastic polyps pose no risk.  Surveillance colonoscopy recommended March 2023  4.  Mild constipation --intermittent and  relieved with MiraLAX, she can continue MiraLAX 17 g daily as needed      MB:PJPETKKO, Camelia Eng, [address] [place],  [place] [address]

## 2020-05-12 ENCOUNTER — Other Ambulatory Visit: Payer: Self-pay

## 2020-05-12 DIAGNOSIS — R1011 Right upper quadrant pain: Secondary | ICD-10-CM

## 2020-05-12 LAB — NUCLEOTIDASE, 5', BLOOD: 5-Nucleotidase: 5 U/L (ref 0–10)

## 2020-05-12 LAB — ANTI-SMOOTH MUSCLE ANTIBODY, IGG: Actin (Smooth Muscle) Antibody (IGG): <20 U (ref <20)

## 2020-05-13 ENCOUNTER — Ambulatory Visit (HOSPITAL_COMMUNITY)
Admission: RE | Admit: 2020-05-13 | Discharge: 2020-05-13 | Disposition: A | Discharge status: Home / Self Care | Payer: BC Managed Care – PPO | Source: Ambulatory Visit | Attending: Internal Medicine | Admitting: Internal Medicine

## 2020-05-13 ENCOUNTER — Other Ambulatory Visit: Payer: Self-pay

## 2020-05-13 ENCOUNTER — Other Ambulatory Visit: Payer: BC Managed Care – PPO

## 2020-05-13 DIAGNOSIS — R748 Abnormal levels of other serum enzymes: Secondary | ICD-10-CM | POA: Diagnosis present

## 2020-05-13 DIAGNOSIS — R1011 Right upper quadrant pain: Secondary | ICD-10-CM | POA: Diagnosis present

## 2020-05-16 ENCOUNTER — Ambulatory Visit: Payer: BC Managed Care – PPO | Admitting: Internal Medicine

## 2020-05-16 NOTE — Progress Notes (Deleted)
Name: [name]  Age/ Sex: 60 y.o., female   MRN/ DOB: [id number], 10-20-1960     PCP: Carlena Hurl, PA-C   Reason for Endocrinology Evaluation: Type 2 Diabetes Mellitus  Initial Endocrine Consultative Visit: ***    PATIENT IDENTIFIER: [name] is a 60 y.o. female with a past medical history of T2DM, HTN and Dyslipidemia. The patient has followed with Endocrinology clinic since *** for consultative assistance with management of her diabetes.  DIABETIC HISTORY:  [name] was diagnosed with DM in 2011, she is intolerant to [place]. Her hemoglobin A1c has ranged from 6.5% in 2016, peaking at 14.6% in 2019.      THYROID HISTORY:  S/P normal thyroid uptake and scan 02/2015 S/P FNA x2 with scan cellularity in 2017 ( isthmic and right inferior nodule)  SUBJECTIVE:   During the last visit (06/2019): A1c 11.4 %  Today (05/16/2020): [name]  She checks her blood sugars *** times daily, preprandial to breakfast and ***. The patient has *** had hypoglycemic episodes since the last clinic visit, which typically occur *** x / - most often occuring ***. The patient is *** symptomatic with these episodes, with symptoms of {symptoms; hypoglycemia:9084048}.   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, operational    Statin: yes ACE-I/ARB: no Prior Diabetic Education: ***   METER DOWNLOAD SUMMARY: Date range evaluated: *** Fingerstick Blood Glucose Tests = *** Average Number Tests/Day = *** Overall Mean FS Glucose = *** Standard Deviation = ***  BG Ranges: Low = *** High = ***   Hypoglycemic Events/30 Days: BG < 50 = *** Episodes of symptomatic severe hypoglycemia = ***    DIABETIC COMPLICATIONS: Microvascular complications:   ***  Denies: CKD  Last Eye Exam: Completed   Macrovascular complications:   ***  Denies: CAD, CVA, PVD   HISTORY:  Past Medical History:  Past Medical History:  Diagnosis Date  . Abscess    increased drainage  from abscess on buttock  . Anal fistula   . Anxiety   . Bilateral hip pain 05/27/2015  . Chronic pain syndrome 05/27/2015  . Depression    sees [name]  . Diabetes mellitus without complication ([place])   . Diverticulosis   . Elevated alkaline phosphatase level   . Fatty liver   . Gallstones   . Hiatal hernia   . Hyperlipidemia   . Hypertension   . Internal hemorrhoids   . Sleep apnea    2008- sleep study, neg. for sleep apnea   . Stroke ([place])   . SVT (supraventricular tachycardia) ([place])   . Symptomatic cholelithiasis 09/11/2018    Past Surgical History:  Past Surgical History:  Procedure Laterality Date  . ANAL EXAMINATION UNDER ANESTHESIA  02/21/11   anal fistula  . BREAST SURGERY  patient does not remember date of procedure   pull fluid off lft br  . CHOLECYSTECTOMY N/A 09/12/2018   Procedure: LAPAROSCOPIC CHOLECYSTECTOMY;  Surgeon: Stark Klein, MD;  Location: [place];  Service: General;  Laterality: N/A;  . ELECTROPHYSIOLOGIC STUDY N/A 05/05/2015   Procedure: SVT Ablation;  Surgeon: Will Meredith Leeds, MD;  Location: [place] [facility];  Service: Cardiovascular;  Laterality: N/A;  . EP IMPLANTABLE DEVICE N/A 01/30/2016   Procedure: Loop Recorder Insertion;  Surgeon: Sanda Klein, MD;  Location: [place] [facility];  Service: Cardiovascular;  Laterality: N/A;  . INCISE AND DRAIN ABCESS     abscess on right thigh and buttock  . KNEE ARTHROSCOPY  left  . LAPAROSCOPIC APPENDECTOMY N/A 09/19/2018   Procedure: APPENDECTOMY LAPAROSCOPIC;  Surgeon: Clovis Riley, MD;  Location: [place];  Service: General;  Laterality: N/A;  . SHOULDER SURGERY  04/14/09   right     Social History:  reports that she has never smoked. She has never used smokeless tobacco. She reports that she does not drink alcohol and does not use drugs. Family History:  Family History  Problem Relation Age of Onset  . Hypertension Mother   . Alzheimer's disease Mother   . Diabetes Father   . Breast  cancer Sister 40  . Anesthesia problems Neg Hx   . Hypotension Neg Hx   . Malignant hyperthermia Neg Hx   . Pseudochol deficiency Neg Hx   . Colon cancer Neg Hx   . Esophageal cancer Neg Hx   . Stomach cancer Neg Hx   . Rectal cancer Neg Hx   . Thyroid disease Neg Hx       HOME MEDICATIONS: Allergies as of 05/16/2020      Reactions   Ozempic (0.25 Or 0.5 Mg-dose) [semaglutide(0.25 Or 0.5mg -dos)]    Severe nausea, bad gas   Tramadol Nausea Only      Medication List       Accurate as of May 16, 2020 12:51 PM. If you have any questions, ask your nurse or doctor.        aspirin 81 MG EC tablet Take 1 tablet (81 mg total) by mouth daily.   Basaglar KwikPen 100 UNIT/ML Inject 100 Units into the skin every morning. And pen needles 1/day   calcium carbonate 600 MG Tabs tablet Commonly known as: Calcium 600 Take 1 tablet (600 mg total) by mouth 2 (two) times daily with a meal.   carvedilol 6.25 MG tablet Commonly known as: COREG Take 1 tablet (6.25 mg total) by mouth 2 (two) times daily.   Dexilant 60 MG capsule Generic drug: dexlansoprazole Take 1 capsule (60 mg total) by mouth daily.   diclofenac 50 MG EC tablet Commonly known as: VOLTAREN Take 1 tablet by mouth twice daily   doxycycline 100 MG capsule Commonly known as: VIBRAMYCIN Take 100 mg by mouth 2 (two) times daily.   DULoxetine 60 MG capsule Commonly known as: CYMBALTA Take 1 capsule by mouth twice daily   Fiasp FlexTouch 100 UNIT/ML FlexTouch Pen Generic drug: insulin aspart SMARTSIG: 28 Unit(s) SUB-Q 3 Times Daily   furosemide 80 MG tablet Commonly known as: LASIX Take 1 tablet (80 mg total) by mouth daily.   glucose blood test strip ONE TOUCH VERIO TEST STRIPS TO TEST 1-2 TIMES A DAY   HYDROcodone-acetaminophen 10-325 MG tablet Commonly known as: NORCO Take 1 tablet by mouth every 6 (six) hours as needed. Please do not  Fill before 05/29/2020   nortriptyline 25 MG capsule Commonly known  as: PAMELOR Take 1 capsule by mouth at bedtime   potassium chloride 10 MEQ tablet Commonly known as: KLOR-CON Take 1 tablet (10 mEq total) by mouth daily.   rosuvastatin 20 MG tablet Commonly known as: Crestor Take 1 tablet (20 mg total) by mouth daily.   Vitamin D (Ergocalciferol) 1.25 MG (50000 UNIT) Caps capsule Commonly known as: DRISDOL Take 1 capsule (50,000 Units total) by mouth every 7 (seven) days.        OBJECTIVE:   Vital Signs: LMP 02/09/2017   Wt Readings from Last 3 Encounters:  05/09/20 267 lb 9.6 oz (121.4 kg)  04/28/20 274 lb (124.3 kg)  04/12/20 265 lb 12.8 oz (120.6 kg)     Exam: General: Pt appears well and is in NAD  Hydration: Well-hydrated with moist mucous membranes and good skin turgor  HEENT: Head: Unremarkable with good dentition. Oropharynx clear without exudate.  Eyes: External eye exam normal without stare, lid lag or exophthalmos.  EOM intact.  PERRL.  Neck: General: Supple without adenopathy. Thyroid: Thyroid size normal.  No goiter or nodules appreciated. No thyroid bruit.  Lungs: Clear with good BS bilat with no rales, rhonchi, or wheezes  Heart: RRR with normal S1 and S2 and no gallops; no murmurs; no rub  Abdomen: Normoactive bowel sounds, soft, nontender, without masses or organomegaly palpable  Extremities: No pretibial edema. No tremor. Normal strength and motion throughout. See detailed diabetic foot exam below.  Skin: Normal texture and temperature to palpation. No rash noted. No Acanthosis nigricans/skin tags. No lipohypertrophy.  Neuro: MS is good with appropriate affect, pt is alert and Ox3    DM foot exam: Please see diabetic assessment flow-sheet detailed below:           DATA REVIEWED:  Lab Results  Component Value Date   HGBA1C 10.9 (H) 02/29/2020   HGBA1C 10.6 (A) 08/11/2019   HGBA1C 11.4 (H) 06/09/2019   Lab Results  Component Value Date   MICROALBUR 0.5 07/09/2016   LDLCALC 122 (H) 02/29/2020    CREATININE 1.01 (H) 02/29/2020   Lab Results  Component Value Date   MICRALBCREAT 25 07/06/2019     Lab Results  Component Value Date   CHOL 180 02/29/2020   HDL 34 (L) 02/29/2020   LDLCALC 122 (H) 02/29/2020   LDLDIRECT 127.0 12/07/2014   TRIG 133 02/29/2020   CHOLHDL 5.3 (H) 02/29/2020        FNA right isthmic nodule 1.8 cm 03/31/2015  THYROID, FINE NEEDLE ASPIRATION ISTHMUS, SPECIMEN 1 OF 2, COLLECTED ON 5/46/27): SCANT FOLLICULAR EPITHELIUM PRESENT ([place] CATEGORY I).    Right inferior nodule 4.8 cm 03/31/2015   THYROID, FINE NEEDLE ASPIRATION RLP, (SPECIMEN 2 OF 2, COLLECTED ON 0/35/00): SCANT FOLLICULAR EPITHELIUM PRESENT ([place] CATEGORY I).  ASSESSMENT / PLAN / RECOMMENDATIONS:   1) Type {NUMBERS 1 OR 2:522190} Diabetes Mellitus, ***controlled, With *** complications - Most recent A1c of *** %. Goal A1c < *** %.  ***  Plan: MEDICATIONS:  ***  EDUCATION / INSTRUCTIONS:  BG monitoring instructions: Patient is instructed to check her blood sugars *** times a day, ***.  Call [facility] if: BG persistently < 70 or > 300. . I reviewed the Rule of 15 for the treatment of hypoglycemia in detail with the patient. Literature supplied.     2) Diabetic complications:   Eye: Does *** have known diabetic retinopathy.   Neuro/ Feet: Does *** have known diabetic peripheral neuropathy .   Renal: Patient does *** have known baseline CKD. She   is *** on an ACEI/ARB at present. Check urine albumin/creatinine ratio yearly starting at time of diagnosis. If albuminuria is positive, treatment is geared toward better glucose, blood pressure control and use of ACE inhibitors or ARBs. Monitor electrolytes and creatinine once to twice yearly.   3) Dyslipidemia:   - LDL above goal   - Pt on Rosuvastatin 20 mg daily     F/U in ***    Signed electronically by: Mack Guise, MD  [facility] End[facility]  [address] [place], [place] [address] Phone: [phone] FAX: [phone]  CC: Carlena Hurl, PA-C [place] [address] Phone: [phone]  Fax: [phone]  Return to Endocrinology clinic as below: Future Appointments  Date Time Provider [place]  05/16/2020  3:00 PM Ludwig Tugwell, Melanie Crazier, MD [facility] None  05/23/2020  3:00 PM Bayard Hugger, NP [facility]PR[facility] CPR  05/27/2020  3:00 PM Ricard Dillon, MD [facility]  06/02/2020  3:40 PM GI[facility]G MM 3 GI-BCGMM GI-BREAST CE  08/29/2020  3:45 PM Tysinger, Camelia Eng, PA-C PFM[facility]PFM [place]  09/21/2020  2:45 PM Darden Dates, Janett Billow, NP [facility] None

## 2020-05-17 ENCOUNTER — Telehealth: Payer: Self-pay

## 2020-05-17 LAB — MITOCHONDRIAL ANTIBODIES: Mitochondrial M2 Ab, IgG: <=20 U

## 2020-05-17 NOTE — Telephone Encounter (Signed)
Left message for patient to please call back. 

## 2020-05-17 NOTE — Telephone Encounter (Signed)
Pt is returning a missed call from the nurse. 

## 2020-05-17 NOTE — Telephone Encounter (Signed)
-----   Message from Theresa Bears, MD sent at 05/17/2020  2:53 PM EST ----- Please let patient know that her antimitochondrial antibody was negative Recent ultrasound did not show any bile duct abnormality Elevation in alkaline phosphatase unlikely to be related to significant liver disease I would like to see her in 6 months  All liver tests are reassuring to this point though there is fatty liver which we can monitor over time

## 2020-05-18 NOTE — Telephone Encounter (Signed)
Left message for patient to call back  

## 2020-05-18 NOTE — Telephone Encounter (Signed)
Pt aware. Recall in epic for OV in 6 mth.

## 2020-05-23 ENCOUNTER — Encounter: Payer: BC Managed Care – PPO | Admitting: Registered Nurse

## 2020-05-27 ENCOUNTER — Encounter (HOSPITAL_BASED_OUTPATIENT_CLINIC_OR_DEPARTMENT_OTHER): Payer: BC Managed Care – PPO | Admitting: Internal Medicine

## 2020-06-02 ENCOUNTER — Inpatient Hospital Stay: Admission: RE | Admit: 2020-06-02 | Payer: BC Managed Care – PPO | Source: Ambulatory Visit

## 2020-06-08 ENCOUNTER — Other Ambulatory Visit: Payer: Self-pay | Admitting: Medical

## 2020-06-13 ENCOUNTER — Other Ambulatory Visit: Payer: Self-pay | Admitting: Medical

## 2020-06-13 MED ORDER — FERROUS GLUCONATE 324 (37.5 FE) MG PO TABS: 1.0000 | ORAL_TABLET | Freq: Two times a day (BID) | ORAL | 0 refills | Status: DC

## 2020-06-13 NOTE — Telephone Encounter (Signed)
Is this appropriate?  

## 2020-06-23 ENCOUNTER — Encounter: Payer: BC Managed Care – PPO | Attending: Physical Medicine & Rehabilitation | Admitting: Registered Nurse

## 2020-06-23 ENCOUNTER — Other Ambulatory Visit: Payer: Self-pay

## 2020-06-23 ENCOUNTER — Encounter: Payer: Self-pay | Admitting: Registered Nurse

## 2020-06-23 VITALS — BP 136/86 | HR 88 | Temp 98.7°F | Ht 63.0 in | Wt 269.8 lb

## 2020-06-23 DIAGNOSIS — M5416 Radiculopathy, lumbar region: Secondary | ICD-10-CM | POA: Diagnosis present

## 2020-06-23 DIAGNOSIS — M7061 Trochanteric bursitis, right hip: Secondary | ICD-10-CM | POA: Insufficient documentation

## 2020-06-23 DIAGNOSIS — M7062 Trochanteric bursitis, left hip: Secondary | ICD-10-CM | POA: Insufficient documentation

## 2020-06-23 DIAGNOSIS — M62830 Muscle spasm of back: Secondary | ICD-10-CM

## 2020-06-23 DIAGNOSIS — M545 Low back pain, unspecified: Secondary | ICD-10-CM | POA: Insufficient documentation

## 2020-06-23 DIAGNOSIS — Z79891 Long term (current) use of opiate analgesic: Secondary | ICD-10-CM | POA: Insufficient documentation

## 2020-06-23 DIAGNOSIS — Z5181 Encounter for therapeutic drug level monitoring: Secondary | ICD-10-CM | POA: Diagnosis not present

## 2020-06-23 DIAGNOSIS — G894 Chronic pain syndrome: Secondary | ICD-10-CM | POA: Insufficient documentation

## 2020-06-23 DIAGNOSIS — M25552 Pain in left hip: Secondary | ICD-10-CM | POA: Insufficient documentation

## 2020-06-23 DIAGNOSIS — G8929 Other chronic pain: Secondary | ICD-10-CM

## 2020-06-23 MED ORDER — HYDROCODONE-ACETAMINOPHEN 10-325 MG PO TABS: 1.0000 | ORAL_TABLET | Freq: Four times a day (QID) | ORAL | 0 refills | Status: DC | PRN

## 2020-06-23 NOTE — Progress Notes (Signed)
Subjective:    Patient ID: [name], female    DOB: June 18, 1960, 61 y.o.   MRN: [id number]  HPI: [name] is a 60 y.o. female who returns for follow up appointment for chronic pain and medication refill. She states her pain is located in her lower back radiating into her bilateral hips and bilateral lower extremities. Also reports increase intensity of lower back pain and bilateral hip pain. We will obtain X-rays, she verbalizes understanding. She rates her pain 7. Her current exercise regime is walking and performing stretching exercises.  Ms. Hardwick Morphine equivalent is 40.00 MME.  Oral Swab was Performed Today.   Pain Inventory Average Pain 8 Pain Right Now 7 My pain is sharp, stabbing and aching  In the last 24 hours, has pain interfered with the following? General activity 5 Relation with others 2 Enjoyment of life 2 What TIME of day is your pain at its worst? daytime, evening and night Sleep (in general) NA  Pain is worse with: walking, bending, standing and some activites Pain improves with: rest, heat/ice and medication Relief from Meds: 5  Family History  Problem Relation Age of Onset  . Hypertension Mother   . Alzheimer's disease Mother   . Diabetes Father   . Breast cancer Sister 67  . Anesthesia problems Neg Hx   . Hypotension Neg Hx   . Malignant hyperthermia Neg Hx   . Pseudochol deficiency Neg Hx   . Colon cancer Neg Hx   . Esophageal cancer Neg Hx   . Stomach cancer Neg Hx   . Rectal cancer Neg Hx   . Thyroid disease Neg Hx    Social History   Socioeconomic History  . Marital status: Divorced    Spouse name: Not on file  . Number of children: 0  . Years of education: Not on file  . Highest education level: Not on file  Occupational History  . Occupation: TEACHER ASSISTANT  Tobacco Use  . Smoking status: Never Smoker  . Smokeless tobacco: Never Used  Vaping Use  . Vaping Use: Never used  Substance and Sexual Activity  .  Alcohol use: No  . Drug use: No  . Sexual activity: Yes  Other Topics Concern  . Not on file  Social History Narrative  . Not on file   Social Determinants of Health   Financial Resource Strain: Not on file  Food Insecurity: Food Insecurity Present  . Worried About Charity fundraiser in the Last Year: Sometimes true  . Ran Out of Food in the Last Year: Sometimes true  Transportation Needs: No Transportation Needs  . Lack of Transportation (Medical): No  . Lack of Transportation (Non-Medical): No  Physical Activity: Not on file  Stress: Not on file  Social Connections: Not on file   Past Surgical History:  Procedure Laterality Date  . ANAL EXAMINATION UNDER ANESTHESIA  02/21/11   anal fistula  . BREAST SURGERY  patient does not remember date of procedure   pull fluid off lft br  . CHOLECYSTECTOMY N/A 09/12/2018   Procedure: LAPAROSCOPIC CHOLECYSTECTOMY;  Surgeon: Stark Klein, MD;  Location: [facility];  Service: General;  Laterality: N/A;  . ELECTROPHYSIOLOGIC STUDY N/A 05/05/2015   Procedure: SVT Ablation;  Surgeon: Will Meredith Leeds, MD;  Location: [facility];  Service: Cardiovascular;  Laterality: N/A;  . EP IMPLANTABLE DEVICE N/A 01/30/2016   Procedure: Loop Recorder Insertion;  Surgeon: Sanda Klein, MD;  Location: [facility];  Service:  Cardiovascular;  Laterality: N/A;  . INCISE AND DRAIN ABCESS     abscess on right thigh and buttock  . KNEE ARTHROSCOPY     left  . LAPAROSCOPIC APPENDECTOMY N/A 09/19/2018   Procedure: APPENDECTOMY LAPAROSCOPIC;  Surgeon: Clovis Riley, MD;  Location: [facility];  Service: General;  Laterality: N/A;  . SHOULDER SURGERY  04/14/09   right   Past Surgical History:  Procedure Laterality Date  . ANAL EXAMINATION UNDER ANESTHESIA  02/21/11   anal fistula  . BREAST SURGERY  patient does not remember date of procedure   pull fluid off lft br  . CHOLECYSTECTOMY N/A 09/12/2018   Procedure: LAPAROSCOPIC CHOLECYSTECTOMY;  Surgeon:  Stark Klein, MD;  Location: [facility];  Service: General;  Laterality: N/A;  . ELECTROPHYSIOLOGIC STUDY N/A 05/05/2015   Procedure: SVT Ablation;  Surgeon: Will Meredith Leeds, MD;  Location: [facility];  Service: Cardiovascular;  Laterality: N/A;  . EP IMPLANTABLE DEVICE N/A 01/30/2016   Procedure: Loop Recorder Insertion;  Surgeon: Sanda Klein, MD;  Location: [facility];  Service: Cardiovascular;  Laterality: N/A;  . INCISE AND DRAIN ABCESS     abscess on right thigh and buttock  . KNEE ARTHROSCOPY     left  . LAPAROSCOPIC APPENDECTOMY N/A 09/19/2018   Procedure: APPENDECTOMY LAPAROSCOPIC;  Surgeon: Clovis Riley, MD;  Location: [facility];  Service: General;  Laterality: N/A;  . SHOULDER SURGERY  04/14/09   right   Past Medical History:  Diagnosis Date  . Abscess    increased drainage from abscess on buttock  . Anal fistula   . Anxiety   . Bilateral hip pain 05/27/2015  . Chronic pain syndrome 05/27/2015  . Depression    sees [name]  . Diabetes mellitus without complication ([place])   . Diverticulosis   . Elevated alkaline phosphatase level   . Fatty liver   . Gallstones   . Hiatal hernia   . Hyperlipidemia   . Hypertension   . Internal hemorrhoids   . Sleep apnea    2008- sleep study, neg. for sleep apnea   . Stroke ([place])   . SVT (supraventricular tachycardia) ([place])   . Symptomatic cholelithiasis 09/11/2018   BP 136/86   Pulse 88   Temp 98.7 F (37.1 C)   Ht 5\' 3"  (1.6 m)   Wt 269 lb 12.8 oz (122.4 kg)   LMP 02/09/2017   SpO2 96%   BMI 47.79 kg/m   Opioid Risk Score:   Fall Risk Score:  `1  Depression screen PHQ 2/9  Depression screen [facility] 2/9 02/29/2020 09/03/2019 07/06/2019 06/30/2019 10/28/2017  Decreased Interest 0 0 1 0 0  Down, Depressed, Hopeless 0 0 1 0 0  PHQ - 2 Score 0 0 2 0 0  Altered sleeping - 1 1 - -  Tired, decreased energy - 1 2 - -  Change in appetite - 0 0 - -  Feeling bad or failure about yourself  - 0 0 - -  Trouble  concentrating - 0 1 - -  Moving slowly or fidgety/restless - 0 0 - -  Suicidal thoughts - 0 0 - -  PHQ-9 Score - 2 6 - -  Difficult doing work/chores - - Not difficult at all - -  Some recent data might be hidden     Review of Systems  Constitutional: Negative.   HENT: Negative.   Eyes: Negative.   Respiratory: Negative.   Cardiovascular: Negative.   Gastrointestinal: Negative.  Endocrine: Negative.   Genitourinary: Negative.   Musculoskeletal: Positive for back pain, gait problem and neck pain.       PAIN IN BOTH LEGS  Skin: Negative.   Allergic/Immunologic: Negative.   Hematological: Negative.   Psychiatric/Behavioral: Negative.        Objective:   Physical Exam Vitals and nursing note reviewed.  Constitutional:      Appearance: Normal appearance.  Cardiovascular:     Rate and Rhythm: Normal rate and regular rhythm.     Pulses: Normal pulses.     Heart sounds: Normal heart sounds.  Pulmonary:     Effort: Pulmonary effort is normal.     Breath sounds: Normal breath sounds.  Musculoskeletal:     Cervical back: Normal range of motion and neck supple.     Right lower leg: Edema present.     Left lower leg: Edema present.     Comments: Normal Muscle Bulk and Muscle Testing Reveals:  Upper Extremities: Full ROM and Muscle Strength 5/5 Lumbar Paraspinal Tenderness: L-3-L-5 Bilateral Greater Trochanter Tenderness Lower Extremities: Full ROM and Muscle Strength 5/5 Arises from Table Slowly Antalgic Gait   Skin:    General: Skin is warm and dry.  Neurological:     Mental Status: She is alert and oriented to person, place, and time.  Psychiatric:        Mood and Affect: Mood normal.        Behavior: Behavior normal.           Assessment & Plan:  1. Acute Exacerbation of Chronic Low Back Pain: RX: Lumbar X-ray.  2.Increase Left Hip Pain: RX: Xray. Chronic BilateralHip pain L>R---endstage OA of hips..Continue HEP as tolerated and Heat and Ice  Therapy. Refilled:Hydrocodone 10/325 one q6 hours as needed for moderate pain #120. Second script e-scribed for the following month.06/23/2020 We will continue the opioid monitoring program, this consists of regular clinic visits, examinations, urine drug screen, pill counts as well as use of [place] Controlled Substance Reporting system. A 12 month History has been reviewed on the [place] Controlled Substance Reporting Systemon 06/23/2020 3. Lumbar Radiculitis:Increase Nortriptyline 50 mg at HS and to send a MY-Chart message next week with an update. She verbalizes understanding.  Continue Cymbalta and Continue HEP as Tolerated. Continue to Monitor.06/23/2020.- 3. Morbid obesity: Continue with healthy diet Regime and HEP. Encouraged to continue with healthy diet regime andHEP.06/23/2020 4.BilateralGreater Trochanteric Bursitis:.Continue with Heat and Ice Therapy. Continue to Monitor.06/23/2020. 5. Left Knee Pain:No complaintsToday.Continue with HEP as Tolerated. Continue to Monitor.06/23/2020 6. Muscle Spasm:No complaints today. Continue to monitor.06/23/2020.   F/U in10months

## 2020-06-23 NOTE — Patient Instructions (Signed)
Increase Your Nortriptyline to two tablets ( 50 mg)  at bedtime for : Nerve Pain.   Send a My-Chart message or call office in a week to evaluate medication change.

## 2020-06-28 LAB — DRUG TOX ALC METAB W/CON, ORAL FLD: Alcohol Metabolite: NEGATIVE ng/mL (ref <25)

## 2020-06-28 LAB — DRUG TOX MONITOR 1 W/CONF, ORAL FLD
Amphetamines: NEGATIVE ng/mL (ref <10)
Barbiturates: NEGATIVE ng/mL (ref <10)
Benzodiazepines: NEGATIVE ng/mL (ref <0.50)
Buprenorphine: NEGATIVE ng/mL (ref <0.10)
Cocaine: NEGATIVE ng/mL (ref <5.0)
Dihydrocodeine: 26.9 ng/mL — ABNORMAL HIGH (ref <2.5)
Fentanyl: NEGATIVE ng/mL (ref <0.10)
Heroin Metabolite: NEGATIVE ng/mL (ref <1.0)
Hydrocodone: >250 ng/mL — ABNORMAL HIGH (ref <2.5)
Hydromorphone: NEGATIVE ng/mL (ref <2.5)
MARIJUANA: NEGATIVE ng/mL (ref <2.5)
MDMA: NEGATIVE ng/mL (ref <10)
Meprobamate: NEGATIVE ng/mL (ref <2.5)
Methadone: NEGATIVE ng/mL (ref <5.0)
Morphine: NEGATIVE ng/mL (ref <2.5)
Nicotine Metabolite: NEGATIVE ng/mL (ref <5.0)
Norhydrocodone: 9.9 ng/mL — ABNORMAL HIGH (ref <2.5)
Noroxycodone: NEGATIVE ng/mL (ref <2.5)
Opiates: POSITIVE ng/mL — AB (ref <2.5)
Oxycodone: NEGATIVE ng/mL (ref <2.5)
Oxymorphone: NEGATIVE ng/mL (ref <2.5)
Phencyclidine: NEGATIVE ng/mL (ref <10)
Tapentadol: NEGATIVE ng/mL (ref <5.0)
Tramadol: NEGATIVE ng/mL (ref <5.0)
Zolpidem: NEGATIVE ng/mL (ref <5.0)

## 2020-07-01 ENCOUNTER — Telehealth: Payer: Self-pay | Admitting: *Deleted

## 2020-07-01 NOTE — Telephone Encounter (Signed)
Oral swab drug screen was consistent for prescribed medications.  ?

## 2020-07-06 ENCOUNTER — Ambulatory Visit (INDEPENDENT_AMBULATORY_CARE_PROVIDER_SITE_OTHER): Payer: BC Managed Care – PPO | Admitting: Internal Medicine

## 2020-07-06 ENCOUNTER — Other Ambulatory Visit: Payer: Self-pay

## 2020-07-06 ENCOUNTER — Ambulatory Visit
Admission: RE | Admit: 2020-07-06 | Discharge: 2020-07-06 | Disposition: A | Discharge status: Home / Self Care | Payer: BC Managed Care – PPO | Source: Ambulatory Visit | Attending: Registered Nurse | Admitting: Registered Nurse

## 2020-07-06 ENCOUNTER — Encounter: Payer: Self-pay | Admitting: Internal Medicine

## 2020-07-06 VITALS — BP 130/74 | HR 80 | Ht 63.0 in | Wt 270.0 lb

## 2020-07-06 DIAGNOSIS — E1165 Type 2 diabetes mellitus with hyperglycemia: Secondary | ICD-10-CM

## 2020-07-06 DIAGNOSIS — E1159 Type 2 diabetes mellitus with other circulatory complications: Secondary | ICD-10-CM

## 2020-07-06 DIAGNOSIS — E0590 Thyrotoxicosis, unspecified without thyrotoxic crisis or storm: Secondary | ICD-10-CM

## 2020-07-06 DIAGNOSIS — E118 Type 2 diabetes mellitus with unspecified complications: Secondary | ICD-10-CM | POA: Diagnosis not present

## 2020-07-06 DIAGNOSIS — Z794 Long term (current) use of insulin: Secondary | ICD-10-CM

## 2020-07-06 DIAGNOSIS — E042 Nontoxic multinodular goiter: Secondary | ICD-10-CM

## 2020-07-06 DIAGNOSIS — E1142 Type 2 diabetes mellitus with diabetic polyneuropathy: Secondary | ICD-10-CM

## 2020-07-06 DIAGNOSIS — IMO0002 Reserved for concepts with insufficient information to code with codable children: Secondary | ICD-10-CM

## 2020-07-06 LAB — POCT GLYCOSYLATED HEMOGLOBIN (HGB A1C): Hemoglobin A1C: 13.2 % — AB (ref 4.0–5.6)

## 2020-07-06 LAB — POCT GLUCOSE (DEVICE FOR HOME USE): POC Glucose: 469 mg/dl — AB (ref 70–99)

## 2020-07-06 MED ORDER — INSULIN PEN NEEDLE 29G X 8MM MISC: 1.0000 | Freq: Four times a day (QID) | 3 refills | Status: DC

## 2020-07-06 MED ORDER — DEXCOM G6 SENSOR MISC: 1.0000 | 3 refills | Status: DC

## 2020-07-06 MED ORDER — METFORMIN HCL ER 750 MG PO TB24: 750.0000 mg | ORAL_TABLET | Freq: Two times a day (BID) | ORAL | 3 refills | Status: DC

## 2020-07-06 MED ORDER — FIASP FLEXTOUCH 100 UNIT/ML ~~LOC~~ SOPN: 32.0000 [IU] | PEN_INJECTOR | Freq: Three times a day (TID) | SUBCUTANEOUS | 2 refills | Status: DC

## 2020-07-06 MED ORDER — DEXCOM G6 TRANSMITTER MISC: 1.0000 | 3 refills | Status: DC

## 2020-07-06 MED ORDER — TOUJEO MAX SOLOSTAR 300 UNIT/ML ~~LOC~~ SOPN: 100.0000 [IU] | PEN_INJECTOR | Freq: Every day | SUBCUTANEOUS | 1 refills | Status: DC

## 2020-07-06 NOTE — Progress Notes (Signed)
Name: [name]  MRN/ DOB: [id number], [birth year]   Age/ Sex: 60 y.o., female    PCP: Carlena Hurl, PA-C   Reason for Endocrinology Evaluation: Type 2 Diabetes Mellitus     Date of Initial Endocrinology Visit: 07/06/2020     PATIENT IDENTIFIER: [name] is a 60 y.o. female with a past medical history of T2DM, dyslipidemia , SVT and MNG. The patient presented for initial endocrinology clinic visit on 07/06/2020 for consultative assistance with her diabetes management.    HPI: [name] was    Diagnosed with DM in 2011 Prior Medications tried/Intolerance: Ozempic- severe nausea . Insulin started in 2015. Metformin- no intolerance , jardiance- no intolerance, soliqua Currently checking blood sugars 2 x / day.  Hypoglycemia episodes : no            Hemoglobin A1c has ranged from 6.5%  in 2016, peaking at 13.6% in 2021 Patient required assistance for hypoglycemia: no Patient has required hospitalization within the last 1 year from hyper or hypoglycemia: no  In terms of diet, the patient eats 3 meals a day, snacks 3x a day, drinks sweet tea   THYROID HISTROY : She was found to have hypermetabolic  MNG on PET scan in 2016 during evaluation of ovarian cyst. These results were confirmed with thyroid ultrasound in 2017. She is S/P FNA of the isthmic and right lobe nodule with scant cellularity in 2017. She was lost to follow up until 06/2020  Has noted local neck enlargement with dysphagia , denies weight loss and diarrhea   HOME DIABETES REGIMEN: Basaglar 100 units daily  Fiasp 28 units with each meal     Statin: yes ACE-I/ARB: no Prior Diabetic Education: yes   METER DOWNLOAD SUMMARY: Did not bring     DIABETIC COMPLICATIONS: Microvascular complications:   neuropathy  Denies: CKD , retinopathy   Last eye exam: Completed 07/2019  Macrovascular complications:   CVA ( [id number]) right hemiparesis   Denies: CAD, PV   PAST HISTORY: Past  Medical History:  Past Medical History:  Diagnosis Date  . Abscess    increased drainage from abscess on buttock  . Anal fistula   . Anxiety   . Bilateral hip pain 05/27/2015  . Chronic pain syndrome 05/27/2015  . Depression    sees [name]  . Diabetes mellitus without complication ([place])   . Diverticulosis   . Elevated alkaline phosphatase level   . Fatty liver   . Gallstones   . Hiatal hernia   . Hyperlipidemia   . Hypertension   . Internal hemorrhoids   . Sleep apnea    2008- sleep study, neg. for sleep apnea   . Stroke ([place])   . SVT (supraventricular tachycardia) ([place])   . Symptomatic cholelithiasis 09/11/2018   Past Surgical History:  Past Surgical History:  Procedure Laterality Date  . ANAL EXAMINATION UNDER ANESTHESIA  02/21/11   anal fistula  . BREAST SURGERY  patient does not remember date of procedure   pull fluid off lft br  . CHOLECYSTECTOMY N/A 09/12/2018   Procedure: LAPAROSCOPIC CHOLECYSTECTOMY;  Surgeon: Stark Klein, MD;  Location: [facility];  Service: General;  Laterality: N/A;  . ELECTROPHYSIOLOGIC STUDY N/A 05/05/2015   Procedure: SVT Ablation;  Surgeon: Will Meredith Leeds, MD;  Location: [facility];  Service: Cardiovascular;  Laterality: N/A;  . EP IMPLANTABLE DEVICE N/A 01/30/2016   Procedure: Loop Recorder Insertion;  Surgeon: Sanda Klein, MD;  Location: [facility];  Service:  Cardiovascular;  Laterality: N/A;  . INCISE AND DRAIN ABCESS     abscess on right thigh and buttock  . KNEE ARTHROSCOPY     left  . LAPAROSCOPIC APPENDECTOMY N/A 09/19/2018   Procedure: APPENDECTOMY LAPAROSCOPIC;  Surgeon: Clovis Riley, MD;  Location: [facility];  Service: General;  Laterality: N/A;  . SHOULDER SURGERY  04/14/09   right      Social History:  reports that she has never smoked. She has never used smokeless tobacco. She reports that she does not drink alcohol and does not use drugs. Family History:  Family History  Problem Relation Age of Onset   . Hypertension Mother   . Alzheimer's disease Mother   . Diabetes Father   . Breast cancer Sister 69  . Anesthesia problems Neg Hx   . Hypotension Neg Hx   . Malignant hyperthermia Neg Hx   . Pseudochol deficiency Neg Hx   . Colon cancer Neg Hx   . Esophageal cancer Neg Hx   . Stomach cancer Neg Hx   . Rectal cancer Neg Hx   . Thyroid disease Neg Hx      HOME MEDICATIONS: Allergies as of 07/06/2020      Reactions   Ozempic (0.25 Or 0.5 Mg-dose) [semaglutide(0.25 Or 0.5mg -dos)]    Severe nausea, bad gas   Tramadol Nausea Only      Medication List       Accurate as of July 06, 2020  3:43 PM. If you have any questions, ask your nurse or doctor.        STOP taking these medications   doxycycline 100 MG capsule Commonly known as: VIBRAMYCIN Stopped by: Dorita Sciara, MD     TAKE these medications   aspirin 81 MG EC tablet Take 1 tablet (81 mg total) by mouth daily.   Basaglar KwikPen 100 UNIT/ML Inject 100 Units into the skin every morning. And pen needles 1/day   calcium carbonate 600 MG Tabs tablet Commonly known as: Calcium 600 Take 1 tablet (600 mg total) by mouth 2 (two) times daily with a meal.   carvedilol 6.25 MG tablet Commonly known as: COREG Take 1 tablet (6.25 mg total) by mouth 2 (two) times daily.   Dexilant 60 MG capsule Generic drug: dexlansoprazole Take 1 capsule (60 mg total) by mouth daily.   diclofenac 50 MG EC tablet Commonly known as: VOLTAREN Take 1 tablet by mouth twice daily   DULoxetine 60 MG capsule Commonly known as: CYMBALTA Take 1 capsule by mouth twice daily   Ferrous Gluconate 324 (37.5 Fe) MG Tabs Take 1 tablet (324 mg total) by mouth 2 (two) times daily with a meal.   Fiasp FlexTouch 100 UNIT/ML FlexTouch Pen Generic drug: insulin aspart SMARTSIG: 28 Unit(s) SUB-Q 3 Times Daily   furosemide 80 MG tablet Commonly known as: LASIX Take 1 tablet (80 mg total) by mouth daily.   glucose blood test strip ONE  TOUCH VERIO TEST STRIPS TO TEST 1-2 TIMES A DAY   HYDROcodone-acetaminophen 10-325 MG tablet Commonly known as: NORCO Take 1 tablet by mouth every 6 (six) hours as needed. Please do not  Fill before 07/29/2020   nortriptyline 25 MG capsule Commonly known as: PAMELOR Take 1 capsule by mouth at bedtime   potassium chloride 10 MEQ tablet Commonly known as: KLOR-CON Take 1 tablet (10 mEq total) by mouth daily.   rosuvastatin 20 MG tablet Commonly known as: Crestor Take 1 tablet (20 mg total) by mouth daily.  Vitamin D (Ergocalciferol) 1.25 MG (50000 UNIT) Caps capsule Commonly known as: DRISDOL Take 1 capsule (50,000 Units total) by mouth every 7 (seven) days.        ALLERGIES: Allergies  Allergen Reactions  . Ozempic (0.25 Or 0.5 Mg-Dose) [Semaglutide(0.25 Or 0.5mg -Dos)]     Severe nausea, bad gas  . Tramadol Nausea Only     REVIEW OF SYSTEMS: A comprehensive ROS was conducted with the patient and is negative except as per HPI and below:  Review of Systems  Gastrointestinal: Negative for diarrhea, nausea and vomiting.  Musculoskeletal: Positive for joint pain.  Neurological: Positive for tingling.      OBJECTIVE:   VITAL SIGNS: BP 130/74   Pulse 80   Ht 5\' 3"  (1.6 m)   Wt 270 lb (122.5 kg)   LMP 02/09/2017   SpO2 99%   BMI 47.83 kg/m    PHYSICAL EXAM:  General: Pt appears well and is in NAD  Neck: General: Supple without adenopathy or carotid bruits. Thyroid: Thyroid goiter appreciated. No thyroid bruit.  Lungs: Clear with good BS bilat with no rales, rhonchi, or wheezes  Heart: RRR   Extremities:  Lower extremities - No pretibial edema. No lesions.  Skin: No rash noted.  Neuro: MS is good with appropriate affect, pt is alert and Ox3    DM foot exam: 07/06/2020  The skin of the feet is  without sores or ulcerations. The pedal pulses are 1+ on right and 1+ on left. The sensation is decreased at the heels to a screening 5.07, 10 gram monofilament  bilaterally      DATA REVIEWED:  Lab Results  Component Value Date   HGBA1C 13.2 (A) 07/06/2020   HGBA1C 10.9 (H) 02/29/2020   HGBA1C 10.6 (A) 08/11/2019   Lab Results  Component Value Date   MICROALBUR 0.5 07/09/2016   LDLCALC 122 (H) 02/29/2020   CREATININE 1.01 (H) 02/29/2020   Lab Results  Component Value Date   MICRALBCREAT 25 07/06/2019    Lab Results  Component Value Date   CHOL 180 02/29/2020   HDL 34 (L) 02/29/2020   LDLCALC 122 (H) 02/29/2020   LDLDIRECT 127.0 12/07/2014   TRIG 133 02/29/2020   CHOLHDL 5.3 (H) 02/29/2020       Results for [name], [name] (MRN [id number]) as of 07/07/2020 13:36  Ref. Range 02/29/2020 11:38  Sodium Latest Ref Range: 134 - 144 mmol/L 143  Potassium Latest Ref Range: 3.5 - 5.2 mmol/L 4.3  Chloride Latest Ref Range: 96 - 106 mmol/L 107 (H)  CO2 Latest Ref Range: 20 - 29 mmol/L 24  Glucose Latest Ref Range: 65 - 99 mg/dL 73  BUN Latest Ref Range: 6 - 24 mg/dL 15  Creatinine Latest Ref Range: 0.57 - 1.00 mg/dL 1.01 (H)  Calcium Latest Ref Range: 8.7 - 10.2 mg/dL 9.6  BUN/Creatinine Ratio Latest Ref Range: 9 - 23  15  Alkaline Phosphatase Latest Ref Range: 44 - 121 IU/L 211 (H)  Albumin Latest Ref Range: 3.8 - 4.9 g/dL 3.8  Albumin/Globulin Ratio Latest Ref Range: 1.2 - 2.2  1.7  AST Latest Ref Range: 0 - 40 IU/L 15  ALT Latest Ref Range: 0 - 32 IU/L 22  Total Protein Latest Ref Range: 6.0 - 8.5 g/dL 6.0  Total Bilirubin Latest Ref Range: 0.0 - 1.2 mg/dL 0.3  GFR, Est Non African American Latest Ref Range: >59 mL/min/1.73 61  GFR, Est African American Latest Ref Range: >59 mL/min/1.73 70   Results for  [name], [name] (MRN [id number]) as of 07/07/2020 13:36  Ref. Range 07/06/2020 16:29  TSH Latest Ref Range: 0.35 - 4.50 uIU/mL 0.28 (L)  Triiodothyronine (T3) Latest Ref Range: 76 - 181 ng/dL 103  T4,Free(Direct) Latest Ref Range: 0.60 - 1.60 ng/dL 0.67    FNA isthmic nodule 1.8 cm  03/31/2015 Scant epithelium    FNA  right lower nodule 4.8 cm 03/31/2015 Scant epithelium   ASSESSMENT / PLAN / RECOMMENDATIONS:   1) Type 2 Diabetes Mellitus, Poorly controlled, With Neuropathic and macrovascular complications - Most recent A1c of 13.2 %. Goal A1c < 7.0 %.   Plan: GENERAL: I have discussed with the patient the pathophysiology of diabetes. We went over the natural progression of the disease. We talked about both insulin resistance and insulin deficiency. We stressed the importance of lifestyle changes including diet and exercise. I explained the complications associated with diabetes including retinopathy, nephropathy, neuropathy as well as increased risk of cardiovascular disease. We went over the benefit seen with glycemic control.    I explained to the patient that diabetic patients are at higher than normal risk for amputations.  Discussed pharmacokinetics of basal/bolus insulin and the importance of taking prandial insulin with meals.   We also discussed avoiding sugar-sweetened beverages and snacks, when possible.   She was on Metformin and jardiance without intolerance issues, we discussed starting metformin as below . Will consider adding Jardiance in the future   Dexcom sent   MEDICATIONS:  Start Metformin 750 mg , 1 tablet with breakfast for 1 week, if no side effects after a week , increase to 1 tablet with Breakfast and 1 tablet with supper - Stop basaglar  - Start [name] 100 units once daily  - Increase Fiasp to 32 units with each meal   EDUCATION / INSTRUCTIONS:  BG monitoring instructions: Patient is instructed to check her blood sugars 3 times a day, before meals .  Call [facility] if: BG persistently < 70  . I reviewed the Rule of 15 for the treatment of hypoglycemia in detail with the patient. Literature supplied.   2) Diabetic complications:   Eye: Does not have known diabetic retinopathy.   Neuro/ Feet: Does  have known diabetic peripheral  neuropathy.  Renal: Patient does not have known baseline CKD. She is not on an ACEI/ARB at present.    3)MNG:   - Will proceed with repeat thyroid ultrasound      4) Subclinical hyperthyroidism:  - Pt is clinically euthyroid  - Most likely due to MNG, but graves' disease is another differential , will check TRAb , if negative will consider   thyroid uptake and scan     F/U in 3 months   Signed electronically by: Mack Guise, MD  [facility]  [facility] [address] [place], [place] [address] Phone: [phone] FAX: [phone]   CC: Carlena Hurl, PA-C [place] [address] Phone: [phone]  Fax: [phone]    Return to Endocrinology clinic as below: Future Appointments  Date Time Provider [place]  07/22/2020  3:40 PM GI-BCG MM 3 GI-BCGMM GI-BREAST CE  08/29/2020  3:00 PM Bayard Hugger, NP CPR-PRMA CPR  08/29/2020  3:45 PM Carlena Hurl, PA-C PFM-PFM PFSM  09/21/2020  2:45 PM Darden Dates, Janett Billow, NP GNA-GNA None

## 2020-07-06 NOTE — Patient Instructions (Signed)
-   Start Metformin 750 mg , 1 tablet with breakfast for 1 week, if no side effects after a week , increase to 1 tablet with Breakfast and 1 tablet with supper - Stop basaglar  - Start [name] 100 units once daily  - Increase Fiasp to 32 units with each meal     Choose healthy, lower carb lower calorie snacks: toss salad, vegetables, cottage cheese, peanut butter, low fat cheese / string cheese, lower sodium deli meat, tuna salad or chicken salad    HOW TO TREAT LOW BLOOD SUGARS (Blood sugar LESS THAN 70 MG/DL)  Please follow the RULE OF 15 for the treatment of hypoglycemia treatment (when your (blood sugars are less than 70 mg/dL)    STEP 1: Take 15 grams of carbohydrates when your blood sugar is low, which includes:   3-4 GLUCOSE TABS  OR  3-4 OZ OF JUICE OR REGULAR SODA OR  ONE TUBE OF GLUCOSE GEL     STEP 2: RECHECK blood sugar in 15 MINUTES STEP 3: If your blood sugar is still low at the 15 minute recheck --> then, go back to STEP 1 and treat AGAIN with another 15 grams of carbohydrates.

## 2020-07-07 DIAGNOSIS — E1165 Type 2 diabetes mellitus with hyperglycemia: Secondary | ICD-10-CM | POA: Insufficient documentation

## 2020-07-07 DIAGNOSIS — Z794 Long term (current) use of insulin: Secondary | ICD-10-CM | POA: Insufficient documentation

## 2020-07-07 DIAGNOSIS — E119 Type 2 diabetes mellitus without complications: Secondary | ICD-10-CM | POA: Insufficient documentation

## 2020-07-07 DIAGNOSIS — E1142 Type 2 diabetes mellitus with diabetic polyneuropathy: Secondary | ICD-10-CM | POA: Insufficient documentation

## 2020-07-07 LAB — TSH: TSH: 0.28 u[IU]/mL — ABNORMAL LOW (ref 0.35–4.50)

## 2020-07-07 LAB — T4, FREE: Free T4: 0.67 ng/dL (ref 0.60–1.60)

## 2020-07-07 MED ORDER — INSULIN PEN NEEDLE 31G X 8 MM MISC: 3 refills | Status: DC

## 2020-07-07 NOTE — Addendum Note (Signed)
Addended by: Jacqualin Combes on: 07/07/2020 04:29 PM   Modules accepted: Orders

## 2020-07-08 LAB — T3: T3, Total: 103 ng/dL (ref 76–181)

## 2020-07-08 LAB — TRAB (TSH RECEPTOR BINDING ANTIBODY): TRAB: <1 IU/L (ref <=2.00)

## 2020-07-13 ENCOUNTER — Ambulatory Visit
Admission: RE | Admit: 2020-07-13 | Discharge: 2020-07-13 | Disposition: A | Discharge status: Home / Self Care | Payer: BC Managed Care – PPO | Source: Ambulatory Visit | Attending: Internal Medicine | Admitting: Internal Medicine

## 2020-07-13 DIAGNOSIS — E042 Nontoxic multinodular goiter: Secondary | ICD-10-CM

## 2020-07-15 ENCOUNTER — Other Ambulatory Visit: Payer: Self-pay | Admitting: Internal Medicine

## 2020-07-15 MED ORDER — PEN NEEDLES 32G X 6 MM MISC: 3 refills | Status: DC

## 2020-07-17 ENCOUNTER — Other Ambulatory Visit: Payer: Self-pay | Admitting: Medical

## 2020-07-17 ENCOUNTER — Other Ambulatory Visit: Payer: Self-pay | Admitting: Registered Nurse

## 2020-07-17 DIAGNOSIS — G894 Chronic pain syndrome: Secondary | ICD-10-CM

## 2020-07-21 ENCOUNTER — Encounter: Payer: Self-pay | Admitting: Internal Medicine

## 2020-07-21 NOTE — Telephone Encounter (Signed)
[name],   Can you please send a note

## 2020-07-22 ENCOUNTER — Ambulatory Visit
Admission: RE | Admit: 2020-07-22 | Discharge: 2020-07-22 | Disposition: A | Discharge status: Home / Self Care | Payer: BC Managed Care – PPO | Source: Ambulatory Visit | Attending: Medical | Admitting: Medical

## 2020-07-22 ENCOUNTER — Other Ambulatory Visit: Payer: Self-pay

## 2020-07-22 DIAGNOSIS — Z1231 Encounter for screening mammogram for malignant neoplasm of breast: Secondary | ICD-10-CM

## 2020-08-03 ENCOUNTER — Telehealth: Payer: Self-pay | Admitting: Registered Nurse

## 2020-08-03 DIAGNOSIS — G894 Chronic pain syndrome: Secondary | ICD-10-CM

## 2020-08-03 MED ORDER — NORTRIPTYLINE HCL 50 MG PO CAPS: 50.0000 mg | ORAL_CAPSULE | Freq: Every day | ORAL | 2 refills | Status: DC

## 2020-08-03 NOTE — Telephone Encounter (Signed)
Nortriptyline sent to pharmacy. [name] is aware via My-Chart message.

## 2020-08-10 ENCOUNTER — Telehealth (INDEPENDENT_AMBULATORY_CARE_PROVIDER_SITE_OTHER): Payer: BC Managed Care – PPO | Admitting: Medical

## 2020-08-10 ENCOUNTER — Encounter: Payer: Self-pay | Admitting: Medical

## 2020-08-10 VITALS — Wt 264.0 lb

## 2020-08-10 DIAGNOSIS — J988 Other specified respiratory disorders: Secondary | ICD-10-CM | POA: Diagnosis not present

## 2020-08-10 DIAGNOSIS — R059 Cough, unspecified: Secondary | ICD-10-CM

## 2020-08-10 DIAGNOSIS — K219 Gastro-esophageal reflux disease without esophagitis: Secondary | ICD-10-CM

## 2020-08-10 MED ORDER — AZITHROMYCIN 250 MG PO TABS: ORAL_TABLET | ORAL | 0 refills | Status: DC

## 2020-08-10 MED ORDER — PROMETHAZINE-DM 6.25-15 MG/5ML PO SYRP: 5.0000 mL | ORAL_SOLUTION | Freq: Four times a day (QID) | ORAL | 0 refills | Status: DC | PRN

## 2020-08-10 NOTE — Progress Notes (Signed)
Subjective:     Patient ID: [name], female   DOB: August 29, 1960, 60 y.o.   MRN: [id number]  This visit type was conducted due to national recommendations for restrictions regarding the COVID-19 Pandemic (e.g. social distancing) in an effort to limit this patient's exposure and mitigate transmission in our community.  Due to their co-morbid illnesses, this patient is at least at moderate risk for complications without adequate follow up.  This format is felt to be most appropriate for this patient at this time.    Documentation for virtual audio and video telecommunications through [place] encounter:  The patient was located at home. The provider was located in the office. The patient did consent to this visit and is aware of possible charges through their insurance for this visit.  The other persons participating in this telemedicine service were none. Time spent on call was 20 minutes and in review of previous records 20 minutes total.  This virtual service is not related to other E/M service within previous 7 days.   HPI Chief Complaint  Patient presents with  . not feeling well    Stuffy nose and chest congestion. Started 2.5 weeks ago. Tried otc but no relief.   Virtual consult.   She notes not feeling well.  She notes 2.5 weeks ago started with head and chest congestoin, mucous coming out of nose.  She notes nostrils are clogged in the morning.  Has heavy cough, some SOB.  No fever.  No nausea, no vomiting.   Not much appetite.  No change in taste or smell.  Did a covid test twice this week and both were negative.   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    She is a Control and instrumentation engineer.  Using nasal congestion OTC.    She is a nonsmoker.    Was on antibiotics 1.5 months ago from wound center.  She will bring by a letter from her insurer which informed her they will no longer cover dexilant.     Past Medical History:  Diagnosis Date  . Abscess     increased drainage from abscess on buttock  . Anal fistula   . Anxiety   . Bilateral hip pain 05/27/2015  . Chronic pain syndrome 05/27/2015  . Depression    sees [name]  . Diabetes mellitus without complication ([place])   . Diverticulosis   . Elevated alkaline phosphatase level   . Fatty liver   . Gallstones   . Hiatal hernia   . Hyperlipidemia   . Hypertension   . Internal hemorrhoids   . Sleep apnea    2008- sleep study, neg. for sleep apnea   . Stroke ([place])   . SVT (supraventricular tachycardia) ([place])   . Symptomatic cholelithiasis 09/11/2018   Current Outpatient Medications on File Prior to Visit  Medication Sig Dispense Refill  . aspirin 81 MG EC tablet Take 1 tablet (81 mg total) by mouth daily. 90 tablet 3  . calcium carbonate (CALCIUM 600) 600 MG TABS tablet Take 1 tablet (600 mg total) by mouth 2 (two) times daily with a meal. 180 tablet 3  . carvedilol (COREG) 6.25 MG tablet Take 1 tablet (6.25 mg total) by mouth 2 (two) times daily. 180 tablet 3  . Continuous Blood Gluc Sensor (DEXCOM G6 SENSOR) MISC 1 Device by Does not apply route as directed. 9 each 3  . Continuous Blood Gluc Transmit (DEXCOM G6 TRANSMITTER) MISC 1 Device by Does not apply route as directed. 1  each 3  . dexlansoprazole (DEXILANT) 60 MG capsule Take 1 capsule (60 mg total) by mouth daily. 90 capsule 3  . diclofenac (VOLTAREN) 50 MG EC tablet Take 1 tablet by mouth twice daily 180 tablet 1  . DULoxetine (CYMBALTA) 60 MG capsule Take 1 capsule by mouth twice daily 180 capsule 0  . Ferrous Gluconate 324 (37.5 Fe) MG TABS Take 1 tablet (324 mg total) by mouth 2 (two) times daily with a meal. 180 tablet 0  . FIASP FLEXTOUCH 100 UNIT/ML FlexTouch Pen Inject 32 Units into the skin with breakfast, with lunch, and with evening meal. 90 mL 2  . furosemide (LASIX) 80 MG tablet Take 1 tablet (80 mg total) by mouth daily. 90 tablet 3  . glucose blood test strip ONE TOUCH VERIO TEST STRIPS TO TEST 1-2 TIMES A DAY 100  each 12  . HYDROcodone-acetaminophen (NORCO) 10-325 MG tablet Take 1 tablet by mouth every 6 (six) hours as needed. Please do not  Fill before 07/29/2020 120 tablet 0  . insulin glargine, 2 Unit Dial, ([name] MAX SOLOSTAR) 300 UNIT/ML Solostar Pen Inject 100 Units into the skin daily. 30 mL 1  . Insulin Pen Needle (PEN NEEDLES) 32G X 6 MM MISC Use as instructed to inject insulin in the morning, at noon, in the evening, and at bedtime 400 each 3  . metFORMIN (GLUCOPHAGE-XR) 750 MG 24 hr tablet Take 1 tablet (750 mg total) by mouth 2 (two) times daily. 180 tablet 3  . nortriptyline (PAMELOR) 50 MG capsule Take 1 capsule (50 mg total) by mouth at bedtime. 90 capsule 2  . potassium chloride (KLOR-CON) 10 MEQ tablet Take 1 tablet (10 mEq total) by mouth daily. 90 tablet 3  . rosuvastatin (CRESTOR) 20 MG tablet Take 1 tablet (20 mg total) by mouth daily. 90 tablet 3   No current facility-administered medications on file prior to visit.     Review of Systems As in subjective     Objective:   Physical Exam Due to coronavirus pandemic stay at home measures, patient visit was virtual and they were not examined in person.   Wt 264 lb (119.7 kg)   LMP 02/09/2017   BMI 46.77 kg/m   Gen: wd, wn, nad No labored breathing, no wheezing, answers questions in complete sentences     Assessment:     Encounter Diagnoses  Name Primary?  . Cough Yes  . Respiratory tract infection   . Gastroesophageal reflux disease, unspecified whether esophagitis present        Plan:     We discussed symptoms and concerns.  Given her underlying health issues and timeframe of symptoms lets begin medication as below.  Discussed proper use of medications, risk and benefits of medications.  Also asked her to get a chest x-ray.  Order placed and she was given information on where to get the chest x-ray.  Advised good hydration, rest, and if much worse or not improving over the next 2 days then call back or  recheck  GERD-I will await the letter from the insurance company about needed changes in medication coverage  [name] was seen today for not feeling well.  Diagnoses and all orders for this visit:  Cough -     DG Chest 2 View; Future  Respiratory tract infection -     DG Chest 2 View; Future  Gastroesophageal reflux disease, unspecified whether esophagitis present  Other orders -     azithromycin (ZITHROMAX) 250 MG tablet; 2  tablets day 1, then 1 tablet days 2-4 -     promethazine-dextromethorphan (PROMETHAZINE-DM) 6.25-15 MG/5ML syrup; Take 5 mLs by mouth 4 (four) times daily as needed for cough.  f/u pending xray

## 2020-08-15 ENCOUNTER — Ambulatory Visit
Admission: RE | Admit: 2020-08-15 | Discharge: 2020-08-15 | Disposition: A | Discharge status: Home / Self Care | Payer: BC Managed Care – PPO | Source: Ambulatory Visit | Attending: Medical | Admitting: Medical

## 2020-08-15 DIAGNOSIS — J988 Other specified respiratory disorders: Secondary | ICD-10-CM

## 2020-08-15 DIAGNOSIS — R059 Cough, unspecified: Secondary | ICD-10-CM

## 2020-08-16 ENCOUNTER — Other Ambulatory Visit: Payer: Self-pay | Admitting: Medical

## 2020-08-16 MED ORDER — PROMETHAZINE-DM 6.25-15 MG/5ML PO SYRP: 5.0000 mL | ORAL_SOLUTION | Freq: Four times a day (QID) | ORAL | 0 refills | Status: DC | PRN

## 2020-08-16 MED ORDER — PREDNISONE 20 MG PO TABS: 40.0000 mg | ORAL_TABLET | Freq: Every day | ORAL | 0 refills | Status: DC

## 2020-08-17 ENCOUNTER — Telehealth: Payer: Self-pay | Admitting: Medical

## 2020-08-17 NOTE — Telephone Encounter (Signed)
Please see the email message from this patient.  Could you call our friend [name] that is the rep for [employer] and get her suggestions to help this patient's issue

## 2020-08-18 NOTE — Telephone Encounter (Signed)
Pt. Aware to try tegaderm directly over the sensor.

## 2020-08-18 NOTE — Telephone Encounter (Signed)
Left message for pt to call me back 

## 2020-08-18 NOTE — Telephone Encounter (Signed)
Pt can try Tegaderm and put it directly over the sensor.

## 2020-08-24 LAB — HM DIABETES EYE EXAM

## 2020-08-25 ENCOUNTER — Telehealth: Payer: Self-pay | Admitting: Cardiovascular Disease

## 2020-08-25 NOTE — Telephone Encounter (Signed)
Per patient schedule message:  "Swallow feet and legs"   Assuming she's having swelling, I called her to get more information, but didn't get an answer. Left message to call back.

## 2020-08-25 NOTE — Telephone Encounter (Signed)
Patient returning call.

## 2020-08-25 NOTE — Telephone Encounter (Signed)
Returned call to patient left message on personal voice mail to call back. 

## 2020-08-25 NOTE — Telephone Encounter (Signed)
Received call back from patient she stated she has increased swelling in both lower legs and feet for the past 1 month.Sob with exertion.Weight stable.No chest pain.Appointment scheduled with Dr.Croitoru 7/14 at 1:30 pm.Advised to go to ED if sob worsens.

## 2020-08-29 ENCOUNTER — Encounter: Payer: BC Managed Care – PPO | Admitting: Registered Nurse

## 2020-08-29 ENCOUNTER — Ambulatory Visit: Payer: BC Managed Care – PPO | Admitting: Medical

## 2020-09-02 ENCOUNTER — Encounter: Payer: BC Managed Care – PPO | Attending: Physical Medicine & Rehabilitation | Admitting: Registered Nurse

## 2020-09-02 ENCOUNTER — Other Ambulatory Visit: Payer: Self-pay

## 2020-09-02 ENCOUNTER — Encounter: Payer: Self-pay | Admitting: Registered Nurse

## 2020-09-02 VITALS — BP 130/74 | HR 80 | Temp 98.7°F | Ht 63.0 in | Wt 264.0 lb

## 2020-09-02 DIAGNOSIS — G894 Chronic pain syndrome: Secondary | ICD-10-CM | POA: Diagnosis not present

## 2020-09-02 DIAGNOSIS — Z79891 Long term (current) use of opiate analgesic: Secondary | ICD-10-CM | POA: Insufficient documentation

## 2020-09-02 DIAGNOSIS — M7061 Trochanteric bursitis, right hip: Secondary | ICD-10-CM | POA: Diagnosis not present

## 2020-09-02 DIAGNOSIS — Z5181 Encounter for therapeutic drug level monitoring: Secondary | ICD-10-CM | POA: Diagnosis not present

## 2020-09-02 DIAGNOSIS — M62830 Muscle spasm of back: Secondary | ICD-10-CM | POA: Insufficient documentation

## 2020-09-02 DIAGNOSIS — M7062 Trochanteric bursitis, left hip: Secondary | ICD-10-CM

## 2020-09-02 DIAGNOSIS — M5416 Radiculopathy, lumbar region: Secondary | ICD-10-CM | POA: Insufficient documentation

## 2020-09-02 MED ORDER — HYDROCODONE-ACETAMINOPHEN 10-325 MG PO TABS: 1.0000 | ORAL_TABLET | Freq: Four times a day (QID) | ORAL | 0 refills | Status: DC | PRN

## 2020-09-02 NOTE — Progress Notes (Addendum)
Subjective:    Patient ID: [name], female    DOB: 03-28-1960, 60 y.o.   MRN: [id number]  HPI: [name] is a 60 y.o. female whose appointment was changed to a My-Chart Video Visit, she called the office this morning reporting her car wouldn't start. [name] agrees with My-Chart Video Visit and verbalizes understanding. She  states her  pain is located in her lower back radiating into her bilateral hips. She rates her pain 7.Her  current exercise regime is walking and performing stretching exercises.  [name] Morphine equivalent is 40.00 MME.  Last Oral Swab was Performed on 06/23/2020, it was consistent.     Pain Inventory Average Pai Pain Right Now 7 My pain is intermittent, constant, stabbing, and tingling  In the last 24 hours, has pain interfered with the following? General activity 5 Relation with others 5 Enjoyment of life 5 What TIME of day is your pain at its worst? morning  and evening Sleep (in general) Good  Pain is worse with: walking Pain improves with: heat/ice and medication Relief from Meds: 9  Family History  Problem Relation Age of Onset   Hypertension Mother    Alzheimer's disease Mother    Diabetes Father    Breast cancer Sister 27   Anesthesia problems Neg Hx    Hypotension Neg Hx    Malignant hyperthermia Neg Hx    Pseudochol deficiency Neg Hx    Colon cancer Neg Hx    Esophageal cancer Neg Hx    Stomach cancer Neg Hx    Rectal cancer Neg Hx    Thyroid disease Neg Hx    Social History   Socioeconomic History   Marital status: Divorced    Spouse name: Not on file   Number of children: 0   Years of education: Not on file   Highest education level: Not on file  Occupational History   Occupation: TEACHER ASSISTANT  Tobacco Use   Smoking status: Never   Smokeless tobacco: Never  Vaping Use   Vaping Use: Never used  Substance and Sexual Activity   Alcohol use: No   Drug use: No   Sexual activity: Yes   Other Topics Concern   Not on file  Social History Narrative   Not on file   Social Determinants of Health   Financial Resource Strain: Not on file  Food Insecurity: Food Insecurity Present   Worried About Running Out of Food in the Last Year: Sometimes true   Ran Out of Food in the Last Year: Sometimes true  Transportation Needs: No Transportation Needs   Lack of Transportation (Medical): No   Lack of Transportation (Non-Medical): No  Physical Activity: Not on file  Stress: Not on file  Social Connections: Not on file   Past Surgical History:  Procedure Laterality Date   ANAL EXAMINATION UNDER ANESTHESIA  02/21/11   anal fistula   BREAST SURGERY  patient does not remember date of procedure   pull fluid off lft br   CHOLECYSTECTOMY N/A 09/12/2018   Procedure: LAPAROSCOPIC CHOLECYSTECTOMY;  Surgeon: Stark Klein, MD;  Location: [facility];  Service: General;  Laterality: N/A;   ELECTROPHYSIOLOGIC STUDY N/A 05/05/2015   Procedure: SVT Ablation;  Surgeon: Will Meredith Leeds, MD;  Location: [facility];  Service: Cardiovascular;  Laterality: N/A;   EP IMPLANTABLE DEVICE N/A 01/30/2016   Procedure: Loop Recorder Insertion;  Surgeon: Sanda Klein, MD;  Location: [facility];  Service: Cardiovascular;  Laterality: N/A;  INCISE AND DRAIN ABCESS     abscess on right thigh and buttock   KNEE ARTHROSCOPY     left   LAPAROSCOPIC APPENDECTOMY N/A 09/19/2018   Procedure: APPENDECTOMY LAPAROSCOPIC;  Surgeon: Clovis Riley, MD;  Location: [facility];  Service: General;  Laterality: N/A;   SHOULDER SURGERY  04/14/09   right   Past Surgical History:  Procedure Laterality Date   ANAL EXAMINATION UNDER ANESTHESIA  02/21/11   anal fistula   BREAST SURGERY  patient does not remember date of procedure   pull fluid off lft br   CHOLECYSTECTOMY N/A 09/12/2018   Procedure: LAPAROSCOPIC CHOLECYSTECTOMY;  Surgeon: Stark Klein, MD;  Location: [facility];  Service: General;  Laterality: N/A;    ELECTROPHYSIOLOGIC STUDY N/A 05/05/2015   Procedure: SVT Ablation;  Surgeon: Will Meredith Leeds, MD;  Location: [facility];  Service: Cardiovascular;  Laterality: N/A;   EP IMPLANTABLE DEVICE N/A 01/30/2016   Procedure: Loop Recorder Insertion;  Surgeon: Sanda Klein, MD;  Location: [facility];  Service: Cardiovascular;  Laterality: N/A;   INCISE AND DRAIN ABCESS     abscess on right thigh and buttock   KNEE ARTHROSCOPY     left   LAPAROSCOPIC APPENDECTOMY N/A 09/19/2018   Procedure: APPENDECTOMY LAPAROSCOPIC;  Surgeon: Clovis Riley, MD;  Location: [facility];  Service: General;  Laterality: N/A;   SHOULDER SURGERY  04/14/09   right   Past Medical History:  Diagnosis Date   Abscess    increased drainage from abscess on buttock   Anal fistula    Anxiety    Bilateral hip pain 05/27/2015   Chronic pain syndrome 05/27/2015   Depression    sees [name]   Diabetes mellitus without complication ([place])    Diverticulosis    Elevated alkaline phosphatase level    Fatty liver    Gallstones    Hiatal hernia    Hyperlipidemia    Hypertension    Internal hemorrhoids    Sleep apnea    2008- sleep study, neg. for sleep apnea    Stroke ([place])    SVT (supraventricular tachycardia) ([facility])    Symptomatic cholelithiasis 09/11/2018   BP 130/74 Comment: last recorded  Pulse 80 Comment: last recorded  Temp 98.7 F (37.1 C) Comment: last recorded  Ht 5\' 3"  (1.6 m) Comment: last recorded  Wt 264 lb (119.7 kg) Comment: last recorded  LMP 02/09/2017   BMI 46.77 kg/m   Opioid Risk Score:   Fall Risk Score:  `1  Depression screen PHQ 2/9  Depression screen [facility] 2/9 09/02/2020 06/23/2020 02/29/2020 09/03/2019 07/06/2019 06/30/2019  Decreased Interest 0 0 0 0 1 0  Down, Depressed, Hopeless 0 0 0 0 1 0  PHQ - 2 Score 0 0 0 0 2 0  Altered sleeping - - - 1 1 -  Tired, decreased energy - - - 1 2 -  Change in appetite - - - 0 0 -  Feeling bad or failure about yourself  - - - 0 0 -   Trouble concentrating - - - 0 1 -  Moving slowly or fidgety/restless - - - 0 0 -  Suicidal thoughts - - - 0 0 -  PHQ-9 Score - - - 2 6 -  Difficult doing work/chores - - - - Not difficult at all -  Some recent data might be hidden     Review of Systems  Constitutional: Negative.   HENT: Negative.    Eyes: Negative.  Respiratory: Negative.    Cardiovascular: Negative.   Gastrointestinal: Negative.   Endocrine: Negative.   Genitourinary: Negative.   Musculoskeletal:  Positive for back pain.  Skin: Negative.   Allergic/Immunologic: Negative.   Neurological:        Tingling  Hematological: Negative.   Psychiatric/Behavioral: Negative.    All other systems reviewed and are negative.     Objective:   Physical Exam Vitals and nursing note reviewed.  Musculoskeletal:     Comments: No Physical Exam Performed: My-Chart Video Visit         Assessment & Plan:  1.Chronic Bilateral Hip pain L>R---endstage OA of hips..Continue HEP as tolerated and Heat and Ice Therapy. Refilled:  Hydrocodone 10/325 one q6 hours as needed for moderate pain #120. Second script e-scribed for the following month. 09/02/2020 We will continue the opioid monitoring program, this consists of regular clinic visits, examinations, urine drug screen, pill counts as well as use of [place] Controlled Substance Reporting system. A 12 month History has been reviewed on the [place] Controlled Substance Reporting System on 09/02/2020 2. Lumbar Radiculitis:  Continue medication regimen with Pamelor. Continue Cymbalta and Continue  HEP as Tolerated.  Continue to Monitor. 09/02/2020.          -              3. Morbid obesity: Continue with healthy diet Regime and HEP. Encouraged to continue with healthy diet regime and HEP. 09/02/2020 4.Bilateral  Greater Trochanteric Bursitis:.Continue with Heat and Ice Therapy. Continue to Monitor. 09/02/2020. 5. Left Knee Pain: No complaints Today.Continue with HEP as  Tolerated. Continue to Monitor. 09/02/2020 6. Muscle Spasm: Continue  Methocarbamol. Continue to monitor. 06/24 /2022.      F/U in 2 months  My-Chart Video Visit Establish Patient Location of Patient: In her Car Location of Provider: In the Office

## 2020-09-04 NOTE — Progress Notes (Deleted)
Cardiology Clinic Note   Patient Name: [name] Date of Encounter: 09/04/2020  Primary Care Provider:  Carlena Hurl, PA-C Primary Cardiologist:  None  Patient Profile    ***  Past Medical History    Past Medical History:  Diagnosis Date   Abscess    increased drainage from abscess on buttock   Anal fistula    Anxiety    Bilateral hip pain 05/27/2015   Chronic pain syndrome 05/27/2015   Depression    sees [name]   Diabetes mellitus without complication ([place])    Diverticulosis    Elevated alkaline phosphatase level    Fatty liver    Gallstones    Hiatal hernia    Hyperlipidemia    Hypertension    Internal hemorrhoids    Sleep apnea    2008- sleep study, neg. for sleep apnea    Stroke [facility])    SVT (supraventricular tachycardia) ([place])    Symptomatic cholelithiasis 09/11/2018   Past Surgical History:  Procedure Laterality Date   ANAL EXAMINATION UNDER ANESTHESIA  02/21/11   anal fistula   BREAST SURGERY  patient does not remember date of procedure   pull fluid off lft br   CHOLECYSTECTOMY N/A 09/12/2018   Procedure: LAPAROSCOPIC CHOLECYSTECTOMY;  Surgeon: Stark Klein, MD;  Location: [place];  Service: General;  Laterality: N/A;   ELECTROPHYSIOLOGIC STUDY N/A 05/05/2015   Procedure: SVT Ablation;  Surgeon: Will Meredith Leeds, MD;  Location: [place] [facility];  Service: Cardiovascular;  Laterality: N/A;   EP IMPLANTABLE DEVICE N/A 01/30/2016   Procedure: Loop Recorder Insertion;  Surgeon: Sanda Klein, MD;  Location: [place] [facility];  Service: Cardiovascular;  Laterality: N/A;   INCISE AND DRAIN ABCESS     abscess on right thigh and buttock   KNEE ARTHROSCOPY     left   LAPAROSCOPIC APPENDECTOMY N/A 09/19/2018   Procedure: APPENDECTOMY LAPAROSCOPIC;  Surgeon: Clovis Riley, MD;  [facility];  Service: General;  Laterality: N/A;   SHOULDER SURGERY  04/14/09   right    Allergies  Allergies  Allergen Reactions   Ozempic (0.25 Or 0.5  Mg-Dose) [Semaglutide(0.25 Or 0.5mg -Dos)]     Severe nausea, bad gas   Tramadol Nausea Only    History of Present Illness    ***  Home Medications    Prior to Admission medications   Medication Sig Start Date End Date Taking? Authorizing Provider  aspirin 81 MG EC tablet Take 1 tablet (81 mg total) by mouth daily. 03/01/20   Tysinger, Camelia Eng, PA-C  calcium carbonate (CALCIUM 600) 600 MG TABS tablet Take 1 tablet (600 mg total) by mouth 2 (two) times daily with a meal. 04/13/20   Tysinger, Camelia Eng, PA-C  carvedilol (COREG) 6.25 MG tablet Take 1 tablet (6.25 mg total) by mouth 2 (two) times daily. 03/01/20   Tysinger, Camelia Eng, PA-C  Continuous Blood Gluc Sensor (DEXCOM G6 SENSOR) MISC 1 Device by Does not apply route as directed. 07/06/20   Shamleffer, Melanie Crazier, MD  Continuous Blood Gluc Transmit (DEXCOM G6 TRANSMITTER) MISC 1 Device by Does not apply route as directed. 07/06/20   Shamleffer, Melanie Crazier, MD  dexlansoprazole (DEXILANT) 60 MG capsule Take 1 capsule (60 mg total) by mouth daily. 03/01/20   Tysinger, Camelia Eng, PA-C  diclofenac (VOLTAREN) 50 MG EC tablet Take 1 tablet by mouth twice daily 04/18/20   Meredith Staggers, MD  DULoxetine (CYMBALTA) 60 MG capsule Take 1 capsule by mouth twice  daily 07/18/20   Tysinger, Camelia Eng, PA-C  ferrous gluconate (FERGON) 324 MG tablet Take 324 mg by mouth 2 (two) times daily with a meal. 06/16/20   [provider]  FIASP FLEXTOUCH 100 UNIT/ML FlexTouch Pen Inject 32 Units into the skin with breakfast, with lunch, and with evening meal. 07/06/20   Shamleffer, Melanie Crazier, MD  furosemide (LASIX) 80 MG tablet Take 1 tablet (80 mg total) by mouth daily. 03/01/20   Tysinger, Camelia Eng, PA-C  glucose blood test strip ONE TOUCH VERIO TEST STRIPS TO TEST 1-2 TIMES A DAY 07/28/19   Tysinger, Camelia Eng, PA-C  HYDROcodone-acetaminophen (NORCO) 10-325 MG tablet Take 1 tablet by mouth every 6 (six) hours as needed. Please do not  Fill before 09/30/2020  09/02/20   Bayard Hugger, NP  insulin glargine, 2 Unit Dial, ([name] MAX SOLOSTAR) 300 UNIT/ML Solostar Pen Inject 100 Units into the skin daily. 07/06/20   Shamleffer, Melanie Crazier, MD  Insulin Pen Needle (PEN NEEDLES) 32G X 6 MM MISC Use as instructed to inject insulin in the morning, at noon, in the evening, and at bedtime 07/15/20   Shamleffer, Melanie Crazier, MD  metFORMIN (GLUCOPHAGE-XR) 750 MG 24 hr tablet Take 1 tablet (750 mg total) by mouth 2 (two) times daily. 07/06/20   Shamleffer, Melanie Crazier, MD  methocarbamol (ROBAXIN) 500 MG tablet TAKE 1 TABLET BY MOUTH TWICE DAILY AS NEEDED FOR MUSCLE SPASM 06/18/20   [provider]  nortriptyline (PAMELOR) 50 MG capsule Take 1 capsule (50 mg total) by mouth at bedtime. 08/03/20   Bayard Hugger, NP  potassium chloride (KLOR-CON) 10 MEQ tablet Take 1 tablet (10 mEq total) by mouth daily. 03/01/20   Tysinger, Camelia Eng, PA-C  rosuvastatin (CRESTOR) 20 MG tablet Take 1 tablet (20 mg total) by mouth daily. 03/01/20 03/01/21  Tysinger, Camelia Eng, PA-C    Family History    Family History  Problem Relation Age of Onset   Hypertension Mother    Alzheimer's disease Mother    Diabetes Father    Breast cancer Sister 61   Anesthesia problems Neg Hx    Hypotension Neg Hx    Malignant hyperthermia Neg Hx    Pseudochol deficiency Neg Hx    Colon cancer Neg Hx    Esophageal cancer Neg Hx    Stomach cancer Neg Hx    Rectal cancer Neg Hx    Thyroid disease Neg Hx    She indicated that her mother is deceased. She indicated that her father is deceased. She indicated that all of her three sisters are alive. She indicated that all of her three brothers are alive. She indicated that the status of her neg hx is unknown.  Social History    Social History   Socioeconomic History   Marital status: Divorced    Spouse name: Not on file   Number of children: 0   Years of education: Not on file   Highest education level: Not on file   Occupational History   Occupation: TEACHER ASSISTANT  Tobacco Use   Smoking status: Never   Smokeless tobacco: Never  Vaping Use   Vaping Use: Never used  Substance and Sexual Activity   Alcohol use: No   Drug use: No   Sexual activity: Yes  Other Topics Concern   Not on file  Social History Narrative   Not on file   Social Determinants of Health   Financial Resource Strain: Not on file  Food Insecurity: Not  on file  Transportation Needs: Not on file  Physical Activity: Not on file  Stress: Not on file  Social Connections: Not on file  Intimate Partner Violence: Not on file     Review of Systems    General:  No chills, fever, night sweats or weight changes.  Cardiovascular:  No chest pain, dyspnea on exertion, edema, orthopnea, palpitations, paroxysmal nocturnal dyspnea. Dermatological: No rash, lesions/masses Respiratory: No cough, dyspnea Urologic: No hematuria, dysuria Abdominal:   No nausea, vomiting, diarrhea, bright red blood per rectum, melena, or hematemesis Neurologic:  No visual changes, wkns, changes in mental status. All other systems reviewed and are otherwise negative except as noted above.  Physical Exam    VS:  LMP 02/09/2017  , BMI There is no height or weight on file to calculate BMI. GEN: Well nourished, well developed, in no acute distress. HEENT: normal. Neck: Supple, no JVD, carotid bruits, or masses. Cardiac: RRR, no murmurs, rubs, or gallops. No clubbing, cyanosis, edema.  Radials/DP/PT 2+ and equal bilaterally.  Respiratory:  Respirations regular and unlabored, clear to auscultation bilaterally. GI: Soft, nontender, nondistended, BS + x 4. MS: no deformity or atrophy. Skin: warm and dry, no rash. Neuro:  Strength and sensation are intact. Psych: Normal affect.  Accessory Clinical Findings    Recent Labs: 02/29/2020: BUN 15; Creatinine, Ser 1.01; Potassium 4.3; Sodium 143 04/12/2020: Hemoglobin 13.4; Platelets 374 05/09/2020: ALT  14 07/06/2020: TSH 0.28   Recent Lipid Panel    Component Value Date/Time   CHOL 180 02/29/2020 1138   TRIG 133 02/29/2020 1138   HDL 34 (L) 02/29/2020 1138   CHOLHDL 5.3 (H) 02/29/2020 1138   CHOLHDL 6.9 06/09/2019 0339   VLDL 52 (H) 06/09/2019 0339   LDLCALC 122 (H) 02/29/2020 1138   LDLDIRECT 127.0 12/07/2014 1534    ECG personally reviewed by me today- *** - No acute changes  Assessment & Plan   1.  Bilateral lower extremity edema-bilateral generalized nonpitting lower extremity edema. Continue furosemide Heart healthy low-sodium diet-salty 6 given Increase physical activity as tolerated Daily weights-contact office with a weight increase of 3 pounds overnight or 5 pounds in a week. Lower extremity support stockings-Bland support stocking sheet given Fluid restriction-64 ounces daily Order BMP  Paroxysmal SVT-heart rate today***.  No recent episodes of accelerated or irregular heartbeats.  Previous loop recorder.  Device reached end of battery life and wants explanted on 12/07/2019. Continue carvedilol, aspirin, Heart healthy low-sodium diet-salty 6 given Increase physical activity as tolerated  Essential hypertension-BP today ***.  Well-controlled at home Continue carvedilol Heart healthy low-sodium diet-salty 6 given Increase physical activity as tolerated  Type 2 diabetes-hemoglobin A1c 13.2 on 07/06/2020 Continue metformin, insulin Heart healthy low-sodium carb modified diet Increase physical activity as tolerated  Disposition: Follow-up with Dr. Sallyanne Kuster or me in 2-3 months.  Jossie Ng. Emauri Krygier NP-C    09/04/2020, 1:36 PM [facility] [place] [address] Office [phone] Fax [phone]  Notice: This dictation was prepared with Dragon dictation along with smaller phrase technology. Any transcriptional errors that result from this process are unintentional and may not be corrected upon review.  I spent***minutes  examining this patient, reviewing medications, and using patient centered shared decision making involving her cardiac care.  Prior to her visit I spent greater than 20 minutes reviewing her past medical history,  medications, and prior cardiac tests.

## 2020-09-05 ENCOUNTER — Ambulatory Visit: Payer: BC Managed Care – PPO | Admitting: General Practice

## 2020-09-16 ENCOUNTER — Telehealth: Payer: Self-pay

## 2020-09-16 NOTE — Telephone Encounter (Signed)
.   Called stating she need a rapid covid test on 10/04/20 for travel on 10/06/20. If you could put the order in for that.

## 2020-09-19 ENCOUNTER — Other Ambulatory Visit: Payer: Self-pay

## 2020-09-19 DIAGNOSIS — Z1152 Encounter for screening for COVID-19: Secondary | ICD-10-CM

## 2020-09-19 NOTE — Telephone Encounter (Signed)
Order put in the system and pt. Already scheduled.

## 2020-09-21 ENCOUNTER — Ambulatory Visit: Payer: BC Managed Care – PPO | Admitting: Adult Health

## 2020-09-22 ENCOUNTER — Other Ambulatory Visit: Payer: Self-pay

## 2020-09-22 ENCOUNTER — Encounter: Payer: Self-pay | Admitting: Cardiovascular Disease

## 2020-09-22 ENCOUNTER — Ambulatory Visit (INDEPENDENT_AMBULATORY_CARE_PROVIDER_SITE_OTHER): Payer: BC Managed Care – PPO | Admitting: Cardiovascular Disease

## 2020-09-22 VITALS — BP 140/78 | HR 85 | Ht 63.0 in | Wt 267.4 lb

## 2020-09-22 DIAGNOSIS — E1165 Type 2 diabetes mellitus with hyperglycemia: Secondary | ICD-10-CM | POA: Diagnosis not present

## 2020-09-22 DIAGNOSIS — Z794 Long term (current) use of insulin: Secondary | ICD-10-CM

## 2020-09-22 DIAGNOSIS — I5032 Chronic diastolic (congestive) heart failure: Secondary | ICD-10-CM

## 2020-09-22 DIAGNOSIS — I471 Supraventricular tachycardia: Secondary | ICD-10-CM

## 2020-09-22 DIAGNOSIS — Z87898 Personal history of other specified conditions: Secondary | ICD-10-CM | POA: Diagnosis not present

## 2020-09-22 MED ORDER — FUROSEMIDE 80 MG PO TABS: 120.0000 mg | ORAL_TABLET | Freq: Every day | ORAL | 4 refills | Status: DC

## 2020-09-22 MED ORDER — POTASSIUM CHLORIDE ER 10 MEQ PO TBCR: 10.0000 meq | EXTENDED_RELEASE_TABLET | Freq: Two times a day (BID) | ORAL | 2 refills | Status: DC

## 2020-09-22 MED ORDER — FUROSEMIDE 40 MG PO TABS: 120.0000 mg | ORAL_TABLET | Freq: Every day | ORAL | 3 refills | Status: DC

## 2020-09-22 NOTE — Progress Notes (Signed)
Patient ID: [name], female   DOB: 05-13-1960, 60 y.o.   MRN: [id number] .    Cardiology Office Note    Date:  09/22/2020   ID:  [name], DOB Mar 04, 1961, MRN [id number]  PCP:  Carlena Hurl, PA-C  Cardiologist:   Sanda Klein, MD   No chief complaint on file.   History of Present Illness:  [name] is a 60 y.o. 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  Over the last couple of months she has noticed exertional dyspnea (has to stop to climb a full flight of stairs) and increased lower extremity edema.  She is taking diclofenac twice daily for lumbar spine and left hip arthritis.  She does not have angina.  She has occasional palpitations but these are always brief.  She has not had syncope or recurrent neurological complaints.  Her echocardiogram in March 2021 showed clear evidence of diastolic dysfunction and borderline criteria for elevated filling pressures (E/e' 15-16).  Glycemic control has been very poor and her hemoglobin A1c was 13.2% in April.  She is now seeing Dr. Kelton Pillar in endocrinology clinic and is taking Lantus 75 units daily, with improving glycemic control.  Significant comorbid conditions include morbid obesity, presumptive obstructive sleep apnea, treated hyperlipidemia and type 2 diabetes mellitus on insulin.  She has normal left ventricular systolic function, but had evidence of diastolic dysfunction and elevated filling pressures on echo in March 2021.Marland Kitchen She has a goiter and low TSH with normal FT4.  She had a negative sleep study in 2019  Past Medical History:  Diagnosis Date   Abscess    increased drainage from abscess on buttock   Anal fistula    Anxiety    Bilateral hip  pain 05/27/2015   Chronic pain syndrome 05/27/2015   Depression    sees [name]   Diabetes mellitus without complication ([place])    Diverticulosis    Elevated alkaline phosphatase level    Fatty liver    Gallstones    Hiatal hernia    Hyperlipidemia    Hypertension    Internal hemorrhoids    Sleep apnea    2008- sleep study, neg. for sleep apnea    Stroke [facility])    SVT (supraventricular tachycardia) ([place])    Symptomatic cholelithiasis 09/11/2018    Past Surgical History:  Procedure Laterality Date   ANAL EXAMINATION UNDER ANESTHESIA  02/21/11   anal fistula   BREAST SURGERY  patient does not remember date of procedure   pull fluid off lft br   CHOLECYSTECTOMY N/A 09/12/2018   Procedure: LAPAROSCOPIC CHOLECYSTECTOMY;  Surgeon: Stark Klein, MD;  Location: [place];  Service: General;  Laterality: N/A;   ELECTROPHYSIOLOGIC STUDY N/A 05/05/2015   Procedure: SVT Ablation;  Surgeon: Will Meredith Leeds, MD;  Location: [place] [facility];  Service: Cardiovascular;  Laterality: N/A;   EP IMPLANTABLE DEVICE N/A 01/30/2016   Procedure: Loop Recorder Insertion;  Surgeon: Sanda Klein, MD;  Location: [place] [facility];  Service: Cardiovascular;  Laterality: N/A;   INCISE AND DRAIN ABCESS     abscess on right thigh and buttock   KNEE ARTHROSCOPY     left   LAPAROSCOPIC APPENDECTOMY N/A 09/19/2018   Procedure: APPENDECTOMY LAPAROSCOPIC;  Surgeon: Clovis Riley, MD;  [facility];  Service: General;  Laterality: N/A;   SHOULDER SURGERY  04/14/09   right    Outpatient Medications Prior to Visit  Medication Sig Dispense Refill   aspirin 81 MG EC tablet Take 1 tablet (81 mg total) by mouth daily. 90 tablet 3   calcium carbonate (CALCIUM 600) 600 MG TABS tablet Take 1 tablet (600 mg total) by mouth 2 (two) times daily with a meal. 180 tablet 3   carvedilol (COREG) 6.25 MG tablet Take 1 tablet (6.25 mg total) by mouth 2 (two) times daily. 180 tablet 3   Continuous Blood Gluc Transmit  (DEXCOM G6 TRANSMITTER) MISC 1 Device by Does not apply route as directed. 1 each 3   dexlansoprazole (DEXILANT) 60 MG capsule Take 1 capsule (60 mg total) by mouth daily. 90 capsule 3   diclofenac (VOLTAREN) 50 MG EC tablet Take 1 tablet by mouth twice daily 180 tablet 1   DULoxetine (CYMBALTA) 60 MG capsule Take 1 capsule by mouth twice daily 180 capsule 0   ferrous gluconate (FERGON) 324 MG tablet Take 324 mg by mouth 2 (two) times daily with a meal.     FIASP FLEXTOUCH 100 UNIT/ML FlexTouch Pen Inject 32 Units into the skin with breakfast, with lunch, and with evening meal. 90 mL 2   furosemide (LASIX) 80 MG tablet Take 1 tablet (80 mg total) by mouth daily. 90 tablet 3   HYDROcodone-acetaminophen (NORCO) 10-325 MG tablet Take 1 tablet by mouth every 6 (six) hours as needed. Please do not  Fill before 09/30/2020 120 tablet 0   insulin glargine, 2 Unit Dial, ([name] MAX SOLOSTAR) 300 UNIT/ML Solostar Pen Inject 100 Units into the skin daily. 30 mL 1   Insulin Pen Needle (PEN NEEDLES) 32G X 6 MM MISC Use as instructed to inject insulin in the morning, at noon, in the evening, and at bedtime 400 each 3   metFORMIN (GLUCOPHAGE-XR) 750 MG 24 hr tablet Take 1 tablet (750 mg total) by mouth 2 (two) times daily. 180 tablet 3   methocarbamol (ROBAXIN) 500 MG tablet TAKE 1 TABLET BY MOUTH TWICE DAILY AS NEEDED FOR MUSCLE SPASM     nortriptyline (PAMELOR) 50 MG capsule Take 1 capsule (50 mg total) by mouth at bedtime. 90 capsule 2   potassium chloride (KLOR-CON) 10 MEQ tablet Take 1 tablet (10 mEq total) by mouth daily. 90 tablet 3   rosuvastatin (CRESTOR) 20 MG tablet Take 1 tablet (20 mg total) by mouth daily. 90 tablet 3   Continuous Blood Gluc Sensor (DEXCOM G6 SENSOR) MISC 1 Device by Does not apply route as directed. 9 each 3   glucose blood test strip ONE TOUCH VERIO TEST STRIPS TO TEST 1-2 TIMES A DAY 100 each 12   No facility-administered medications prior to visit.     Allergies:   Ozempic  (0.25 or 0.5 mg-dose) [semaglutide(0.25 or 0.5mg -dos)] and Tramadol   Social History   Socioeconomic History   Marital status: Divorced    Spouse name: Not on file   Number of children: 0   Years of education: Not on file   Highest education level: Not on file  Occupational History   Occupation: TEACHER ASSISTANT  Tobacco Use   Smoking status: Never   Smokeless tobacco: Never  Vaping Use   Vaping Use: Never used  Substance and Sexual Activity   Alcohol use: No   Drug use: No   Sexual activity: Yes  Other Topics Concern   Not on file  Social History Narrative   Not on file   Social Determinants of Health   Financial Resource Strain: Not on file  Food Insecurity: Not on file  Transportation Needs: Not on file  Physical Activity: Not on file  Stress: Not on file  Social Connections: Not on file     Family History:  The patient's family history includes Alzheimer's disease in her mother; Breast cancer (age of onset: 91) in her sister; Diabetes in her father; Hypertension in her mother.   ROS:   Please see the history of present illness.    ROS All other systems reviewed and are negative.   PHYSICAL EXAM:   VS:  BP 140/78   Pulse 85   Ht 5\' 3"  (1.6 m)   Wt 267 lb 6.4 oz (121.3 kg)   LMP 02/09/2017   SpO2 98%   BMI 47.37 kg/m     General: Alert, oriented x3, no distress, morbidly obese Head: no evidence of trauma, PERRL, EOMI, no exophtalmos or lid lag, no myxedema, no xanthelasma; normal ears, nose and oropharynx Neck: normal jugular venous pulsations and no hepatojugular reflux; brisk carotid pulses without delay and no carotid bruits Chest: clear to auscultation, no signs of consolidation by percussion or palpation, normal fremitus, symmetrical and full respiratory excursions Cardiovascular: normal position and quality of the apical impulse, regular rhythm, normal first and second heart sounds, no murmurs, rubs or gallops Abdomen: no tenderness or distention,  no masses by palpation, no abnormal pulsatility or arterial bruits, normal bowel sounds, no hepatosplenomegaly Extremities: no clubbing, cyanosis; symmetrical trace ankle edema; 2+ radial, ulnar and brachial pulses bilaterally; 2+ right femoral, posterior tibial and dorsalis pedis pulses; 2+ left femoral, posterior tibial and dorsalis pedis pulses; no subclavian or femoral bruits Neurological: grossly nonfocal Psych: Normal mood and affect   Wt Readings from Last 3 Encounters:  09/22/20 267 lb 6.4 oz (121.3 kg)  09/02/20 264 lb (119.7 kg)  08/10/20 264 lb (119.7 kg)      Studies/Labs Reviewed:   Echocardiogram 06/09/2019     1. Left ventricular ejection fraction, by estimation, is 60 to 65%. The  left ventricle has normal function. The left ventricle has no regional  wall motion abnormalities. There is mild left ventricular hypertrophy.  Left ventricular diastolic parameters  are consistent with Grade I diastolic dysfunction (impaired relaxation).   2. Right ventricular systolic function is normal. The right ventricular  size is normal.   3. The mitral valve is grossly normal. Trivial mitral valve  regurgitation.   4. The aortic valve was not well visualized. Aortic valve regurgitation  is not visualized.   5. The inferior vena cava is normal in size with greater than 50%  respiratory variability, suggesting right atrial pressure of 3 mmHg.   EKG:  EKG is ordered today and personally reviewed shows normal sinus rhythm, delayed R wave progression V1-V3, QTC 433 ms  Recent Labs: 02/29/2020: BUN 15; Creatinine, Ser 1.01; Potassium 4.3; Sodium 143 04/12/2020: Hemoglobin 13.4; Platelets 374 05/09/2020: ALT 14 07/06/2020: TSH 0.28   Lipid Panel    Component Value Date/Time   CHOL 180 02/29/2020 1138   TRIG 133 02/29/2020 1138   HDL 34 (L) 02/29/2020 1138   CHOLHDL 5.3 (H) 02/29/2020 1138   CHOLHDL 6.9 06/09/2019 0339   VLDL 52 (H) 06/09/2019 0339   LDLCALC 122 (H) 02/29/2020  1138   LDLDIRECT 127.0 12/07/2014 1534    ASSESSMENT:    1. AVNRT (AV nodal re-entry tachycardia) ([place])  2. Dependent edema      PLAN:  In order of problems listed above:  CHF: Findings are consistent with acute exacerbation of diastolic heart failure with evidence of both elevated left and right heart filling pressures.  This may be due to increased sodium/fluid retention due to consistent daily use of diclofenac as well as a relatively high dose of insulin.  Advised attention to sodium restriction in her diet, try to reduce the frequency of use of any NSAID.  We will increase furosemide and potassium doses.  Reevaluate in a few weeks.   Palpitations: Episodes of palpitations are brief and infrequent.  We will avoid increasing the dose of carvedilol while we are trying to increase diuresis.  VNRTs/p RF ablation.  There has been no documentation of recurrent arrhythmia since the ablation. Syncope: Note, probably vasovagal.  No significant arrhythmia during 3 years of loop recorder monitoring. 4. DM: Poorly controlled.  Seeing Dr. Kelton Pillar.  Should avoid Actos as this will worsen heart failure.  Encouraged her to discuss use of Wilder Glade or Jardiance since this will help reduce the need for insulin and will help with her heart failure. 5.  Hyperthyroidism: Seeing an endocrinologist now.   Patient Instructions  Medication Instructions:  INCREASE the Furosemide to 120 mg once daily INCREASE the Potassium to 10 mEq twice daily Try to cut back on the Diclofenac and use tylenol  *If you need a refill on your cardiac medications before your next appointment, please call your pharmacy*   Lab Work: None ordered If you have labs (blood work) drawn today and your tests are completely normal, you will receive your results only by: [place] (if you have MyChart) OR A paper copy in the mail If you have any lab test that is abnormal or we need to change your treatment, we will call you to  review the results.   Testing/Procedures: None ordered   Follow-Up: At [facility], you and your health needs are our priority.  As part of our continuing mission to provide you with exceptional heart care, we have created designated Provider Care Teams.  These Care Teams include your primary Cardiologist (physician) and Advanced Practice Providers (APPs -  Physician Assistants and Nurse Practitioners) who all work together to provide you with the care you need, when you need it.  We recommend signing up for the patient portal called "MyChart".  Sign up information is provided on this After Visit Summary.  MyChart is used to connect with patients for Virtual Visits (Telemedicine).  Patients are able to view lab/test results, encounter notes, upcoming appointments, etc.  Non-urgent messages can be sent to your provider as well.   To learn more about what you can do with MyChart, go to [email or web address].    Your next appointment:   Follow up with APP in 1-2 months and Dr. Sallyanne Kuster in 4 months Signed, Sanda Klein, MD  09/22/2020 1:47 PM    [facility] [place], [place], [place]  [address] Phone: [phone]; Fax: [phone]

## 2020-09-22 NOTE — Patient Instructions (Signed)
Medication Instructions:  INCREASE the Furosemide to 120 mg once daily INCREASE the Potassium to 10 mEq twice daily Try to cut back on the Diclofenac and use tylenol  *If you need a refill on your cardiac medications before your next appointment, please call your pharmacy*   Lab Work: None ordered If you have labs (blood work) drawn today and your tests are completely normal, you will receive your results only by: [place] (if you have MyChart) OR A paper copy in the mail If you have any lab test that is abnormal or we need to change your treatment, we will call you to review the results.   Testing/Procedures: None ordered   Follow-Up: At [facility], you and your health needs are our priority.  As part of our continuing mission to provide you with exceptional heart care, we have created designated Provider Care Teams.  These Care Teams include your primary Cardiologist (physician) and Advanced Practice Providers (APPs -  Physician Assistants and Nurse Practitioners) who all work together to provide you with the care you need, when you need it.  We recommend signing up for the patient portal called "MyChart".  Sign up information is provided on this After Visit Summary.  MyChart is used to connect with patients for Virtual Visits (Telemedicine).  Patients are able to view lab/test results, encounter notes, upcoming appointments, etc.  Non-urgent messages can be sent to your provider as well.   To learn more about what you can do with MyChart, go to [email or web address].    Your next appointment:   Follow up with APP in 1-2 months and Dr. Sallyanne Kuster in 4 months

## 2020-09-25 ENCOUNTER — Other Ambulatory Visit: Payer: Self-pay | Admitting: Medical

## 2020-09-26 NOTE — Telephone Encounter (Signed)
Request for refill on the pts. Ferrous gluconate last apt 08/10/20 and next apt is 10/04/20.

## 2020-09-26 NOTE — Telephone Encounter (Signed)
I received request for iron refill.   Is she still taking this BID?    If so, lets change to once daily and get in soon for physical, fasting.  We will need to check iron level at that time.

## 2020-10-04 ENCOUNTER — Other Ambulatory Visit (INDEPENDENT_AMBULATORY_CARE_PROVIDER_SITE_OTHER): Payer: BC Managed Care – PPO

## 2020-10-04 ENCOUNTER — Encounter: Payer: Self-pay | Admitting: Medical

## 2020-10-04 DIAGNOSIS — Z1152 Encounter for screening for COVID-19: Secondary | ICD-10-CM

## 2020-10-04 LAB — POC COVID19 BINAXNOW: SARS Coronavirus 2 Ag: NEGATIVE

## 2020-10-05 ENCOUNTER — Other Ambulatory Visit: Payer: Self-pay

## 2020-10-05 ENCOUNTER — Encounter: Payer: Self-pay | Admitting: Internal Medicine

## 2020-10-05 ENCOUNTER — Ambulatory Visit: Payer: BC Managed Care – PPO | Admitting: Internal Medicine

## 2020-10-05 VITALS — BP 120/74 | HR 91 | Ht 63.0 in | Wt 263.2 lb

## 2020-10-05 DIAGNOSIS — I10 Essential (primary) hypertension: Secondary | ICD-10-CM

## 2020-10-05 DIAGNOSIS — E0590 Thyrotoxicosis, unspecified without thyrotoxic crisis or storm: Secondary | ICD-10-CM

## 2020-10-05 DIAGNOSIS — E1165 Type 2 diabetes mellitus with hyperglycemia: Secondary | ICD-10-CM | POA: Diagnosis not present

## 2020-10-05 DIAGNOSIS — E042 Nontoxic multinodular goiter: Secondary | ICD-10-CM

## 2020-10-05 DIAGNOSIS — Z794 Long term (current) use of insulin: Secondary | ICD-10-CM | POA: Diagnosis not present

## 2020-10-05 LAB — BASIC METABOLIC PANEL
BUN: 18 mg/dL (ref 6–23)
CO2: 28 mEq/L (ref 19–32)
Calcium: 9.8 mg/dL (ref 8.4–10.5)
Chloride: 101 mEq/L (ref 96–112)
Creatinine, Ser: 1.14 mg/dL (ref 0.40–1.20)
GFR: 52.56 mL/min — ABNORMAL LOW (ref >60.00)
Glucose, Bld: 175 mg/dL — ABNORMAL HIGH (ref 70–99)
Potassium: 4.3 mEq/L (ref 3.5–5.1)
Sodium: 139 mEq/L (ref 135–145)

## 2020-10-05 LAB — POCT GLYCOSYLATED HEMOGLOBIN (HGB A1C): Hemoglobin A1C: 9.2 % — AB (ref 4.0–5.6)

## 2020-10-05 LAB — TSH: TSH: 0.21 u[IU]/mL — ABNORMAL LOW (ref 0.35–5.50)

## 2020-10-05 LAB — T4, FREE: Free T4: 0.8 ng/dL (ref 0.60–1.60)

## 2020-10-05 NOTE — Patient Instructions (Signed)
-   Continue  Metformin 750 mg , 1 tablet Twice daily  - Decrease [name]  90 units once daily  - Increase Fiasp to 36 units with first meal of the day and 32 units with second meal of the day      HOW TO TREAT LOW BLOOD SUGARS (Blood sugar LESS THAN 70 MG/DL) Please follow the RULE OF 15 for the treatment of hypoglycemia treatment (when your (blood sugars are less than 70 mg/dL)   STEP 1: Take 15 grams of carbohydrates when your blood sugar is low, which includes:  3-4 GLUCOSE TABS  OR 3-4 OZ OF JUICE OR REGULAR SODA OR ONE TUBE OF GLUCOSE GEL    STEP 2: RECHECK blood sugar in 15 MINUTES STEP 3: If your blood sugar is still low at the 15 minute recheck --> then, go back to STEP 1 and treat AGAIN with another 15 grams of carbohydrates.

## 2020-10-05 NOTE — Progress Notes (Signed)
Name: [name]  Age/ Sex: 60 y.o., female   MRN/ DOB: [id number], April 15, 1960     PCP: Carlena Hurl, PA-C   Reason for Endocrinology Evaluation: Type 2 Diabetes Mellitus  Initial Endocrine Consultative Visit: 07/06/2020    PATIENT IDENTIFIER: [name] is a 60 y.o. female with a past medical history of SVT, MNG, T2DM and Dyslipidemia . The patient has followed with Endocrinology clinic since 07/06/2020 for consultative assistance with management of her diabetes.  DIABETIC HISTORY:  [name] was diagnosed with DM in 2011 ,Ozempic- severe nausea . Insulin started in 2015. Metformin- no intolerance , jardiance- no intolerance, soliqua. Her hemoglobin A1c has ranged from 6.5%  in 2016, peaking at 13.6% in 2021  THYROID HISTROY : She was found to have hypermetabolic  MNG on PET scan in 2016 during evaluation of ovarian cyst. These results were confirmed with thyroid ultrasound in 2017. She is S/P FNA of the isthmic and right lobe nodule with scant cellularity in 2017. She was lost to follow up until 06/2020   SUBJECTIVE:   During the last visit (07/06/2020): A1c 13.2 % We started Metformin , switched Basaglar to [employer] and increased Fiasp     Today (10/05/2020): [name]  She checks her blood sugars multiple times a day through CGM times daily . The patient has  had hypoglycemic episodes since the last clinic visit, which typically occur at night .  Denies nausea or diarrhea  Denies local neck swelling  Has noted weight loss     She is on KCl 10 mEq BID   HOME DIABETES REGIMEN:  Metformin 750 mg BID  [name] 100 mg daily  Fiasp 32 units with each meal ( BID)    Statin: yes ACE-I/ARB: no Prior Diabetic Education: yes    CONTINUOUS GLUCOSE MONITORING RECORD INTERPRETATION    Dates of Recording: 7/14-7/17/2022  Sensor description:freestyle libre  Results statistics:   CGM use % of time 40  Average and SD 213/52.7  Time in range       44  %  % Time Above 180 16  % Time above 250 36  % Time Below target 2      Glycemic patterns summary: optimal while sleeping, high postprandial  Hyperglycemic episodes  postprandial  Hypoglycemic episodes occurred overnight  Overnight periods: trends down     DIABETIC COMPLICATIONS: Microvascular complications:  Neuropathy Denies: CKD, retinopathy Last Eye Exam: Completed   Macrovascular complications:  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   Diabetes mellitus without complication ([place])    Diverticulosis    Elevated alkaline phosphatase level    Fatty liver    Gallstones    Hiatal hernia    Hyperlipidemia    Hypertension    Internal hemorrhoids    Sleep apnea    2008- sleep study, neg. for sleep apnea    Stroke [facility])    SVT (supraventricular tachycardia) ([place])    Symptomatic cholelithiasis 09/11/2018   Past Surgical History:  Past Surgical History:  Procedure Laterality Date   ANAL EXAMINATION UNDER ANESTHESIA  02/21/11   anal fistula   BREAST SURGERY  patient does not remember date  of procedure   pull fluid off lft br   CHOLECYSTECTOMY N/A 09/12/2018   Procedure: LAPAROSCOPIC CHOLECYSTECTOMY;  Surgeon: Stark Klein, MD;  Location: [place];  Service: General;  Laterality: N/A;   ELECTROPHYSIOLOGIC STUDY N/A 05/05/2015   Procedure: SVT Ablation;  Surgeon: Will Meredith Leeds, MD;  Location: [place] CV LAB;  Service: Cardiovascular;  Laterality: N/A;   EP IMPLANTABLE DEVICE N/A 01/30/2016   Procedure: Loop Recorder Insertion;  Surgeon: Sanda Klein, MD;  Location: [place] [facility];  Service: Cardiovascular;  Laterality: N/A;   INCISE AND DRAIN ABCESS     abscess on right thigh and  buttock   KNEE ARTHROSCOPY     left   LAPAROSCOPIC APPENDECTOMY N/A 09/19/2018   Procedure: APPENDECTOMY LAPAROSCOPIC;  Surgeon: Clovis Riley, MD;  Location: [place] OR;  Service: General;  Laterality: N/A;   SHOULDER SURGERY  04/14/09   right   Social History:  reports that she has never smoked. She has never used smokeless tobacco. She reports that she does not drink alcohol and does not use drugs. Family History:  Family History  Problem Relation Age of Onset   Hypertension Mother    Alzheimer's disease Mother    Diabetes Father    Breast cancer Sister 72   Anesthesia problems Neg Hx    Hypotension Neg Hx    Malignant hyperthermia Neg Hx    Pseudochol deficiency Neg Hx    Colon cancer Neg Hx    Esophageal cancer Neg Hx    Stomach cancer Neg Hx    Rectal cancer Neg Hx    Thyroid disease Neg Hx      HOME MEDICATIONS: Allergies as of 10/05/2020       Reactions   Ozempic (0.25 Or 0.5 Mg-dose) [semaglutide(0.25 Or 0.'5mg'$ -dos)]    Severe nausea, bad gas   Tramadol Nausea Only        Medication List        Accurate as of October 05, 2020 12:56 PM. If you have any questions, ask your nurse or doctor.          aspirin 81 MG EC tablet Take 1 tablet (81 mg total) by mouth daily.   calcium carbonate 600 MG Tabs tablet Commonly known as: Calcium 600 Take 1 tablet (600 mg total) by mouth 2 (two) times daily with a meal.   carvedilol 6.25 MG tablet Commonly known as: COREG Take 1 tablet (6.25 mg total) by mouth 2 (two) times daily.   Dexcom G6 Transmitter Misc 1 Device by Does not apply route as directed.   Dexilant 60 MG capsule Generic drug: dexlansoprazole Take 1 capsule (60 mg total) by mouth daily.   diclofenac 50 MG EC tablet Commonly known as: VOLTAREN Take 1 tablet by mouth twice daily   DULoxetine 60 MG capsule Commonly known as: CYMBALTA Take 1 capsule by mouth twice daily   ferrous gluconate 324 MG tablet Commonly known as: FERGON Take 1 tablet (324  mg total) by mouth daily with breakfast.   Fiasp FlexTouch 100 UNIT/ML FlexTouch Pen Generic drug: insulin aspart Inject 32 Units into the skin with breakfast, with lunch, and with evening meal.   furosemide 80 MG tablet Commonly known as: LASIX Take 1.5 tablets (120 mg total) by mouth daily.   HYDROcodone-acetaminophen 10-325 MG tablet Commonly known as: NORCO Take 1 tablet by mouth every 6 (six) hours as needed. Please do not  Fill before 09/30/2020   metFORMIN 750 MG 24 hr tablet Commonly known  as: GLUCOPHAGE-XR Take 1 tablet (750 mg total) by mouth 2 (two) times daily.   methocarbamol 500 MG tablet Commonly known as: ROBAXIN TAKE 1 TABLET BY MOUTH TWICE DAILY AS NEEDED FOR MUSCLE SPASM   nortriptyline 50 MG capsule Commonly known as: Pamelor Take 1 capsule (50 mg total) by mouth at bedtime.   Pen Needles 32G X 6 MM Misc Use as instructed to inject insulin in the morning, at noon, in the evening, and at bedtime   potassium chloride 10 MEQ tablet Commonly known as: KLOR-CON Take 1 tablet (10 mEq total) by mouth 2 (two) times daily.   rosuvastatin 20 MG tablet Commonly known as: Crestor Take 1 tablet (20 mg total) by mouth daily.   [name] Max SoloStar 300 UNIT/ML Solostar Pen Generic drug: insulin glargine (2 Unit Dial) Inject 100 Units into the skin daily.         OBJECTIVE:   Vital Signs: LMP 02/09/2017   Wt Readings from Last 3 Encounters:  09/22/20 267 lb 6.4 oz (121.3 kg)  09/02/20 264 lb (119.7 kg)  08/10/20 264 lb (119.7 kg)     Exam: General: Pt appears well and is in NAD  Lungs: Clear with good BS bilat with no rales, rhonchi, or wheezes  Heart: RRR with normal S1 and S2 and no gallops; no murmurs; no rub  Extremities: No pretibial edema.   Neuro: MS is good with appropriate affect, pt is alert and Ox3      DM foot exam: 07/06/2020   The skin of the feet is  without sores or ulcerations. The pedal pulses are 1+ on right and 1+ on left. The  sensation is decreased at the heels to a screening 5.07, 10 gram monofilament bilaterally     DATA REVIEWED:  Lab Results  Component Value Date   HGBA1C 13.2 (A) 07/06/2020   HGBA1C 10.9 (H) 02/29/2020   HGBA1C 10.6 (A) 08/11/2019   Lab Results  Component Value Date   MICROALBUR 0.5 07/09/2016   LDLCALC 122 (H) 02/29/2020   CREATININE 1.01 (H) 02/29/2020   Lab Results  Component Value Date   MICRALBCREAT 25 07/06/2019     Lab Results  Component Value Date   CHOL 180 02/29/2020   HDL 34 (L) 02/29/2020   LDLCALC 122 (H) 02/29/2020   LDLDIRECT 127.0 12/07/2014   TRIG 133 02/29/2020   CHOLHDL 5.3 (H) 02/29/2020         THYROID ultrasound 07/13/2020  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 No discrete nodules are identifiable separate from the background thyroid parenchyma on today's examination. Previously biopsied nodules now blend in with the background thyroid parenchyma.   IMPRESSION: Slight interval enlargement of the overall thyroid gland which is diffusely heterogeneous consistent with diffuse goitrous replacement.   Previously biopsied nodules are now no longer evident and have blended in with the background thyroid parenchyma. No discrete nodules or new suspicious findings identified.   FNA isthmic nodule 1.8 cm  03/31/2015 Scant epithelium     FNA right lower nodule 4.8 cm 03/31/2015 Scant epithelium   ASSESSMENT / PLAN / RECOMMENDATIONS:   1) Type 2 Diabetes Mellitus, poorly controlled, With Neuropathic and macrovascular complications - Most recent A1c of 9.2%. Goal A1c <7.0%.      She was on Metformin and jardiance without intolerance issues in the past, she is tolerating metformin, will consider Jardiance in the future again. She has been using the Dexcom without difficulty In  review of the Dexcom download she has been noted with severe hypoglycemia> 350 mg/DL with the first meal of the  day which is usually around 3 PM.  She eats again around 9 PM but her glucose is acceptable at that time I have emphasized the importance of limiting the amount of carbohydrates especially with the first meal of the day, I am going to increase her prandial dose with it, patient advised to eat 3 meals a day separated by 4 hours that way she does not end up binging on 1 meal    MEDICATIONS: Continue metformin 750 mg twice daily Decrease [name] to 90 units once daily Increase Fiasp to 36 units with first meal of the day and continue 32 units with the rest of the meals of the day  EDUCATION / INSTRUCTIONS: BG monitoring instructions: Patient is instructed to check her blood sugars 3 times a day, before meals. Call [place] [facility] if: BG persistently < 70  I reviewed the Rule of 15 for the treatment of hypoglycemia in detail with the patient. Literature supplied.   2) Diabetic complications:  Eye: Does not have known diabetic retinopathy.  Neuro/ Feet: Does  have known diabetic peripheral neuropathy .  Renal: Patient does not have known baseline CKD. She   is  on an ACEI/ARB at present.    3)MNG:    -No local neck symptoms -Thyroid ultrasound May/2022 shows increase thyroid heterogeneity but no actual thyroid nodules, previously biopsied nodules have blended in with a heterogeneous background -We will continue to monitor         4) Subclinical hyperthyroidism:   - Pt is clinically euthyroid -TSH remains low - Most likely due to MNG, but graves' disease is another differential , TRAb negative, will proceed with thyroid uptake and scan  5) HTN:  -BP initially elevated at 172/72, but repeat BP check was normal at 120/74 mmHg -We will check Aldo and renin   F/U in 4 months   Signed electronically by: Mack Guise, MD  [facility]  [address] [place], [place] [address] Phone: [phone] FAX:  [phone]   CC: Carlena Hurl, PA-C [place] [address] Phone: [phone]  Fax: [phone]  Return to Endocrinology clinic as below: Future Appointments  Date Time Provider [place]  10/05/2020  3:20 PM Crislyn Willbanks, Melanie Crazier, MD [facility] None  10/13/2020 11:30 AM Glade Lloyd Camelia Eng, PA-C [facility] [place]  11/07/2020  3:15 PM Tommye Standard, Lorelee Cover., PA-C [facility]  01/31/2021  3:20 PM Croitoru, Dani Gobble, MD [facility]  02/01/2021  3:45 PM Frann Rider, NP [facility] None

## 2020-10-09 LAB — ALDOSTERONE + RENIN ACTIVITY W/ RATIO
ALDOS/RENIN RATIO: 5.2 (ref 0.0–30.0)
ALDOSTERONE: 5.9 ng/dL (ref 0.0–30.0)
Renin: 1.14 ng/mL/hr (ref 0.167–5.380)

## 2020-10-13 ENCOUNTER — Encounter: Payer: BC Managed Care – PPO | Admitting: Medical

## 2020-10-17 ENCOUNTER — Encounter (HOSPITAL_COMMUNITY): Payer: BC Managed Care – PPO

## 2020-10-17 ENCOUNTER — Ambulatory Visit (HOSPITAL_COMMUNITY): Payer: BC Managed Care – PPO

## 2020-10-18 ENCOUNTER — Encounter (HOSPITAL_COMMUNITY): Payer: BC Managed Care – PPO

## 2020-10-18 ENCOUNTER — Encounter: Payer: BC Managed Care – PPO | Admitting: Medical

## 2020-10-19 ENCOUNTER — Encounter: Payer: Self-pay | Admitting: Internal Medicine

## 2020-10-20 ENCOUNTER — Encounter: Payer: Self-pay | Admitting: Internal Medicine

## 2020-10-28 ENCOUNTER — Encounter: Payer: Self-pay | Admitting: Internal Medicine

## 2020-10-31 ENCOUNTER — Other Ambulatory Visit: Payer: Self-pay | Admitting: Medical

## 2020-10-31 ENCOUNTER — Other Ambulatory Visit: Payer: Self-pay | Admitting: Physical Medicine & Rehabilitation

## 2020-10-31 DIAGNOSIS — G894 Chronic pain syndrome: Secondary | ICD-10-CM

## 2020-10-31 DIAGNOSIS — Z5181 Encounter for therapeutic drug level monitoring: Secondary | ICD-10-CM

## 2020-10-31 DIAGNOSIS — M160 Bilateral primary osteoarthritis of hip: Secondary | ICD-10-CM

## 2020-10-31 DIAGNOSIS — Z79899 Other long term (current) drug therapy: Secondary | ICD-10-CM

## 2020-11-01 ENCOUNTER — Other Ambulatory Visit: Payer: Self-pay

## 2020-11-01 ENCOUNTER — Encounter: Payer: BC Managed Care – PPO | Attending: Physical Medicine & Rehabilitation | Admitting: Registered Nurse

## 2020-11-01 VITALS — BP 111/71 | HR 87 | Ht 63.0 in | Wt 269.0 lb

## 2020-11-01 DIAGNOSIS — M7061 Trochanteric bursitis, right hip: Secondary | ICD-10-CM | POA: Diagnosis present

## 2020-11-01 DIAGNOSIS — M5416 Radiculopathy, lumbar region: Secondary | ICD-10-CM | POA: Diagnosis present

## 2020-11-01 DIAGNOSIS — M7062 Trochanteric bursitis, left hip: Secondary | ICD-10-CM

## 2020-11-01 DIAGNOSIS — Z5181 Encounter for therapeutic drug level monitoring: Secondary | ICD-10-CM | POA: Insufficient documentation

## 2020-11-01 DIAGNOSIS — Z79891 Long term (current) use of opiate analgesic: Secondary | ICD-10-CM | POA: Insufficient documentation

## 2020-11-01 DIAGNOSIS — G894 Chronic pain syndrome: Secondary | ICD-10-CM | POA: Diagnosis present

## 2020-11-01 MED ORDER — HYDROCODONE-ACETAMINOPHEN 10-325 MG PO TABS: 1.0000 | ORAL_TABLET | Freq: Four times a day (QID) | ORAL | 0 refills | Status: DC | PRN

## 2020-11-01 NOTE — Progress Notes (Signed)
Subjective:    Patient ID: [name], female    DOB: 1960-12-17, 60 y.o.   MRN: [id number]  HPI: [name] is a 60 y.o. female who returns for follow up appointment for chronic pain and medication refill. She states her pain is located in her lower back radiating into her bilateral hips L>R.Marland KitchenShe rates her pain 7. Her current exercise regime is walking and performing stretching exercises.  Ms. Diglio Morphine equivalent is 40.00 MME.   Oral Swab was Performed today.      Pain Inventory Average Pain 8 Pain Right Now 7 My pain is sharp, stabbing, and tingling  In the last 24 hours, has pain interfered with the following? General activity 8 Relation with others 9 Enjoyment of life 9 What TIME of day is your pain at its worst? daytime and night Sleep (in general) Fair  Pain is worse with: walking, inactivity, and standing Pain improves with: rest and medication Relief from Meds: 7  Family History  Problem Relation Age of Onset   Hypertension Mother    Alzheimer's disease Mother    Diabetes Father    Breast cancer Sister 32   Anesthesia problems Neg Hx    Hypotension Neg Hx    Malignant hyperthermia Neg Hx    Pseudochol deficiency Neg Hx    Colon cancer Neg Hx    Esophageal cancer Neg Hx    Stomach cancer Neg Hx    Rectal cancer Neg Hx    Thyroid disease Neg Hx    Social History   Socioeconomic History   Marital status: Divorced    Spouse name: Not on file   Number of children: 0   Years of education: Not on file   Highest education level: Not on file  Occupational History   Occupation: TEACHER ASSISTANT  Tobacco Use   Smoking status: Never   Smokeless tobacco: Never  Vaping Use   Vaping Use: Never used  Substance and Sexual Activity   Alcohol use: No   Drug use: No   Sexual activity: Yes  Other Topics Concern   Not on file  Social History Narrative   Not on file   Social Determinants of Health   Financial Resource Strain: Not on  file  Food Insecurity: Not on file  Transportation Needs: Not on file  Physical Activity: Not on file  Stress: Not on file  Social Connections: Not on file   Past Surgical History:  Procedure Laterality Date   ANAL EXAMINATION UNDER ANESTHESIA  02/21/11   anal fistula   BREAST SURGERY  patient does not remember date of procedure   pull fluid off lft br   CHOLECYSTECTOMY N/A 09/12/2018   Procedure: LAPAROSCOPIC CHOLECYSTECTOMY;  Surgeon: Stark Klein, MD;  Location: [place];  Service: General;  Laterality: N/A;   ELECTROPHYSIOLOGIC STUDY N/A 05/05/2015   Procedure: SVT Ablation;  Surgeon: Will Meredith Leeds, MD;  Location: [place] [facility];  Service: Cardiovascular;  Laterality: N/A;   EP IMPLANTABLE DEVICE N/A 01/30/2016   Procedure: Loop Recorder Insertion;  Surgeon: Sanda Klein, MD;  Location: [place] [facility];  Service: Cardiovascular;  Laterality: N/A;   INCISE AND DRAIN ABCESS     abscess on right thigh and buttock   KNEE ARTHROSCOPY     left   LAPAROSCOPIC APPENDECTOMY N/A 09/19/2018   Procedure: APPENDECTOMY LAPAROSCOPIC;  Surgeon: Clovis Riley, MD;  Location: [place] OR;  Service: General;  Laterality: N/A;   SHOULDER SURGERY  04/14/09   right  Past Surgical History:  Procedure Laterality Date   ANAL EXAMINATION UNDER ANESTHESIA  02/21/11   anal fistula   BREAST SURGERY  patient does not remember date of procedure   pull fluid off lft br   CHOLECYSTECTOMY N/A 09/12/2018   Procedure: LAPAROSCOPIC CHOLECYSTECTOMY;  Surgeon: Stark Klein, MD;  Location: [place];  Service: General;  Laterality: N/A;   ELECTROPHYSIOLOGIC STUDY N/A 05/05/2015   Procedure: SVT Ablation;  Surgeon: Will Meredith Leeds, MD;  Location: [place] [facility];  Service: Cardiovascular;  Laterality: N/A;   EP IMPLANTABLE DEVICE N/A 01/30/2016   Procedure: Loop Recorder Insertion;  Surgeon: Sanda Klein, MD;  Location: [place] [facility];  Service: Cardiovascular;  Laterality: N/A;   INCISE AND DRAIN  ABCESS     abscess on right thigh and buttock   KNEE ARTHROSCOPY     left   LAPAROSCOPIC APPENDECTOMY N/A 09/19/2018   Procedure: APPENDECTOMY LAPAROSCOPIC;  Surgeon: Clovis Riley, MD;  Location: [place];  Service: General;  Laterality: N/A;   SHOULDER SURGERY  04/14/09   right   Past Medical History:  Diagnosis Date   Abscess    increased drainage from abscess on buttock   Anal fistula    Anxiety    Bilateral hip pain 05/27/2015   Chronic pain syndrome 05/27/2015   Depression    sees [name]   Diabetes mellitus without complication [facility])    Diverticulosis    Elevated alkaline phosphatase level    Fatty liver    Gallstones    Hiatal hernia    Hyperlipidemia    Hypertension    Internal hemorrhoids    Sleep apnea    2008- sleep study, neg. for sleep apnea    Stroke [facility])    SVT (supraventricular tachycardia) ([facility])    Symptomatic cholelithiasis 09/11/2018   BP 111/71   Pulse 87   Ht '5\' 3"'$  (1.6 m)   Wt 269 lb (122 kg)   LMP 02/09/2017   SpO2 96%   BMI 47.65 kg/m   Opioid Risk Score:   Fall Risk Score:  `1  Depression screen PHQ 2/9  Depression screen [facility] 2/9 09/02/2020 06/23/2020 02/29/2020 09/03/2019 07/06/2019 06/30/2019  Decreased Interest 0 0 0 0 1 0  Down, Depressed, Hopeless 0 0 0 0 1 0  PHQ - 2 Score 0 0 0 0 2 0  Altered sleeping - - - 1 1 -  Tired, decreased energy - - - 1 2 -  Change in appetite - - - 0 0 -  Feeling bad or failure about yourself  - - - 0 0 -  Trouble concentrating - - - 0 1 -  Moving slowly or fidgety/restless - - - 0 0 -  Suicidal thoughts - - - 0 0 -  PHQ-9 Score - - - 2 6 -  Difficult doing work/chores - - - - Not difficult at all -  Some recent data might be hidden    Review of Systems  Constitutional: Negative.   HENT: Negative.    Eyes: Negative.   Respiratory: Negative.    Cardiovascular: Negative.   Gastrointestinal: Negative.   Endocrine: Negative.   Genitourinary: Negative.   Musculoskeletal:  Positive for  arthralgias, back pain and myalgias.  Skin: Negative.   Allergic/Immunologic: Negative.   Neurological: Negative.   Hematological: Negative.   Psychiatric/Behavioral: Negative.    All other systems reviewed and are negative.     Objective:   Physical Exam Vitals and nursing note reviewed.  Constitutional:      Appearance: Normal appearance. She is obese.  Cardiovascular:     Rate and Rhythm: Normal rate and regular rhythm.     Pulses: Normal pulses.     Heart sounds: Normal heart sounds.  Pulmonary:     Effort: Pulmonary effort is normal.     Breath sounds: Normal breath sounds.  Musculoskeletal:     Cervical back: Normal range of motion and neck supple.     Comments: Normal Muscle Bulk and Muscle Testing Reveals:  Upper Extremities: Full ROM and Muscle Strength 5/5 Lumbar Paraspinal Tenderness: L-3-L-5 Bilateral Greater Trochanter Tenderness Lower Extremities: Full ROM and Muscle Strength 5/5 Arises from Table Slowly Narrow Based  Gait     Skin:    General: Skin is warm and dry.  Neurological:     Mental Status: She is alert and oriented to person, place, and time.  Psychiatric:        Mood and Affect: Mood normal.        Behavior: Behavior normal.         Assessment & Plan:  1.Chronic Bilateral Hip pain L>R---endstage OA of hips..Continue HEP as tolerated and Heat and Ice Therapy. Refilled:  Hydrocodone 10/325 one q6 hours as needed for moderate pain #120. Second script e-scribed for the following month. 11/01/2020 We will continue the opioid monitoring program, this consists of regular clinic visits, examinations, urine drug screen, pill counts as well as use of [place] Controlled Substance Reporting system. A 12 month History has been reviewed on the [place] Controlled Substance Reporting System on 11/01/2020 2. Lumbar Radiculitis:  Continue medication regimen with Pamelor. Continue Cymbalta and Continue  HEP as Tolerated.  Continue to Monitor.  11/01/2020.          -              3. Morbid obesity: Continue with healthy diet Regime and HEP. Encouraged to continue with healthy diet regime and HEP. 11/01/2020 4.Bilateral  Greater Trochanteric Bursitis:.Continue with Heat and Ice Therapy. Continue to Monitor. 11/01/2020. 5. Left Knee Pain: No complaints Today.Continue with HEP as Tolerated. Continue to Monitor. 11/01/2020 6. Muscle Spasm: Continue  Methocarbamol. Continue to monitor. 08/23 /2022.      F/U in 2 months

## 2020-11-02 ENCOUNTER — Other Ambulatory Visit: Payer: Self-pay | Admitting: Physical Medicine & Rehabilitation

## 2020-11-02 DIAGNOSIS — G894 Chronic pain syndrome: Secondary | ICD-10-CM

## 2020-11-02 DIAGNOSIS — Z5181 Encounter for therapeutic drug level monitoring: Secondary | ICD-10-CM

## 2020-11-02 DIAGNOSIS — Z79899 Other long term (current) drug therapy: Secondary | ICD-10-CM

## 2020-11-02 DIAGNOSIS — M160 Bilateral primary osteoarthritis of hip: Secondary | ICD-10-CM

## 2020-11-03 ENCOUNTER — Encounter: Payer: Self-pay | Admitting: Medical

## 2020-11-04 ENCOUNTER — Telehealth: Payer: Self-pay | Admitting: Registered Nurse

## 2020-11-04 MED ORDER — DICLOFENAC SODIUM 1 % EX GEL: 2.0000 g | Freq: Every day | CUTANEOUS | 0 refills | Status: DC | PRN

## 2020-11-06 NOTE — Progress Notes (Signed)
Cardiology Office Note   Date:  11/07/2020   ID:  [name], DOB 02-14-1961, MRN [id number]  PCP:  Carlena Hurl, PA-C  Cardiologist:  Sanda Klein, MD EP: None  Chief Complaint  Patient presents with   Follow-up    CHF       History of Present Illness: [name] is a 60 y.o. 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 Sallyanne Kuster 09/22/20 at which time she had complaints of DOE and increased LE edema. Her symptoms were concerning for acute on chronic diastolic CHF possibly 2/2 daily diclofenac use and high insulin dose. Her lasix and potassium were increased to '120mg'$  daily and 10 mEq BID respectively and she was recommended to cut back her diclofenac, choosing tylenol for pain control. Her last echocardiogram 05/2019 showed EF 60-65%, G1DD, no RWMA, mild LVH, normal RV size/function, and no significant valvular abnormalities.   She presents today for close follow-up of her CHF. She has continued have LE edema which is minimally improved with the increase in lasix. She has also had continued DOE. She describes an episode of SOB over the weekend when letting her dog outside. She got up, walked outside to open the fence, then returned inside noting severe SOB and pre-syncope. This resolved with rest and has not reoccurred. 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 She has not had any chest pain or palpitations. No complaints of orthopnea or PND. She has no significant family history of CAD, MI, or CHF. She reports BP is elevated today but she attributes this to it being the first day of school - SBP has been in the 120s at home. She has been off her  diclofenac for 2 weeks with little improvement in LE edema and worsening arthritis pains. She asks again about anti-inflammatory options for managing her arthritis - unfortunately all have similar side effects of LE edema. Tylenol encouraged for pain management.     Past Medical History:  Diagnosis Date   Abscess    increased drainage from abscess on buttock   Anal fistula    Anxiety    Bilateral hip pain 05/27/2015   Chronic pain syndrome 05/27/2015   Depression    sees [name]   Diabetes mellitus without complication [facility])    Diverticulosis    Elevated alkaline phosphatase level    Fatty liver    Gallstones    Hiatal hernia    Hyperlipidemia    Hypertension    Internal hemorrhoids    Sleep apnea    2008- sleep study, neg. for sleep apnea    Stroke [facility])    SVT (supraventricular tachycardia) ([place])    Symptomatic cholelithiasis 09/11/2018    Past Surgical History:  Procedure Laterality Date   ANAL EXAMINATION UNDER ANESTHESIA  02/21/11   anal fistula   BREAST SURGERY  patient does not remember date of procedure   pull fluid off lft br   CHOLECYSTECTOMY N/A 09/12/2018   Procedure: LAPAROSCOPIC CHOLECYSTECTOMY;  Surgeon: Stark Klein, MD;  Location: [place];  Service: General;  Laterality: N/A;   ELECTROPHYSIOLOGIC STUDY N/A 05/05/2015   Procedure: SVT Ablation;  Surgeon: Will  Meredith Leeds, MD;  Location: [place] [facility];  Service: Cardiovascular;  Laterality: N/A;   EP IMPLANTABLE DEVICE N/A 01/30/2016   Procedure: Loop Recorder Insertion;  Surgeon: Sanda Klein, MD;  Location: [place] [facility];  Service: Cardiovascular;  Laterality: N/A;   INCISE AND DRAIN ABCESS     abscess on right thigh and buttock   KNEE ARTHROSCOPY     left   LAPAROSCOPIC APPENDECTOMY N/A 09/19/2018   Procedure: APPENDECTOMY LAPAROSCOPIC;  Surgeon: Clovis Riley, MD;  Location: [place];  Service: General;  Laterality: N/A;   SHOULDER SURGERY  04/14/09   right     Current Outpatient  Medications  Medication Sig Dispense Refill   aspirin 81 MG EC tablet Take 1 tablet (81 mg total) by mouth daily. 90 tablet 3   calcium carbonate (CALCIUM 600) 600 MG TABS tablet Take 1 tablet (600 mg total) by mouth 2 (two) times daily with a meal. 180 tablet 3   carvedilol (COREG) 6.25 MG tablet Take 1 tablet (6.25 mg total) by mouth 2 (two) times daily. 180 tablet 3   Continuous Blood Gluc Sensor (FREESTYLE LIBRE 14 DAY SENSOR) MISC See admin instructions.     dexlansoprazole (DEXILANT) 60 MG capsule Take 1 capsule (60 mg total) by mouth daily. 90 capsule 3   diclofenac (VOLTAREN) 50 MG EC tablet Take 1 tablet by mouth twice daily 180 tablet 1   diclofenac Sodium (VOLTAREN) 1 % GEL Apply 2 g topically daily as needed. Use daily as needed for pain. Diclofenac tablet on hold. 100 g 0   DULoxetine (CYMBALTA) 60 MG capsule Take 1 capsule by mouth twice daily 180 capsule 0   ferrous gluconate (FERGON) 324 MG tablet Take 1 tablet (324 mg total) by mouth daily with breakfast. 90 tablet 0   FIASP FLEXTOUCH 100 UNIT/ML FlexTouch Pen Inject 32 Units into the skin with breakfast, with lunch, and with evening meal. 90 mL 2   furosemide (LASIX) 80 MG tablet Take 1.5 tablets (120 mg total) by mouth daily. 45 tablet 4   HYDROcodone-acetaminophen (NORCO) 10-325 MG tablet Take 1 tablet by mouth every 6 (six) hours as needed. Please do not  Fill before 11/29/2020 120 tablet 0   insulin glargine, 2 Unit Dial, ([name] MAX SOLOSTAR) 300 UNIT/ML Solostar Pen Inject 100 Units into the skin daily. 30 mL 1   Insulin Pen Needle (PEN NEEDLES) 32G X 6 MM MISC Use as instructed to inject insulin in the morning, at noon, in the evening, and at bedtime 400 each 3   metFORMIN (GLUCOPHAGE-XR) 750 MG 24 hr tablet Take 1 tablet (750 mg total) by mouth 2 (two) times daily. 180 tablet 3   methocarbamol (ROBAXIN) 500 MG tablet TAKE 1 TABLET BY MOUTH TWICE DAILY AS NEEDED FOR MUSCLE SPASM     nortriptyline (PAMELOR) 50 MG capsule  Take 1 capsule (50 mg total) by mouth at bedtime. 90 capsule 2   potassium chloride (KLOR-CON) 10 MEQ tablet Take 1 tablet (10 mEq total) by mouth 2 (two) times daily. 180 tablet 2   rosuvastatin (CRESTOR) 20 MG tablet Take 1 tablet (20 mg total) by mouth daily. 90 tablet 3   No current facility-administered medications for this visit.    Allergies:   Ozempic (0.25 or 0.5 mg-dose) [semaglutide(0.25 or 0.'5mg'$ -dos)] and Tramadol    Social History:  The patient  reports that she has never smoked. She has never used smokeless tobacco. She reports that she does not drink alcohol and  does not use drugs.   Family History:  The patient's family history includes Alzheimer's disease in her mother; Breast cancer (age of onset: 42) in her sister; Diabetes in her father; Hypertension in her mother.    ROS:  Please see the history of present illness.   Otherwise, review of systems are positive for none.   All other systems are reviewed and negative.    PHYSICAL EXAM: VS:  BP (!) 150/72 (BP Location: Left Arm)   Pulse 82   Ht '5\' 3"'$  (1.6 m)   Wt 261 lb 12.8 oz (118.8 kg)   LMP 02/09/2017   SpO2 98%   BMI 46.38 kg/m  , BMI Body mass index is 46.38 kg/m. GEN: Well nourished, well developed, in no acute distress HEENT: sclera anicteric Neck: no JVD, carotid bruits, or masses Cardiac: RRR; no murmurs, rubs, or gallops, mild non-pitting edema  Respiratory:  clear to auscultation bilaterally, normal work of breathing GI: soft, obese, nontender, nondistended, + BS MS: no deformity or atrophy Skin: warm and dry, no rash Neuro:  Strength and sensation are intact Psych: euthymic mood, full affect   EKG:  EKG is not ordered today.    Recent Labs: 04/12/2020: Hemoglobin 13.4; Platelets 374 05/09/2020: ALT 14 10/05/2020: BUN 18; Creatinine, Ser 1.14; Potassium 4.3; Sodium 139; TSH 0.21    Lipid Panel    Component Value Date/Time   CHOL 180 02/29/2020 1138   TRIG 133 02/29/2020 1138   HDL 34 (L)  02/29/2020 1138   CHOLHDL 5.3 (H) 02/29/2020 1138   CHOLHDL 6.9 06/09/2019 0339   VLDL 52 (H) 06/09/2019 0339   LDLCALC 122 (H) 02/29/2020 1138   LDLDIRECT 127.0 12/07/2014 1534      Wt Readings from Last 3 Encounters:  11/07/20 261 lb 12.8 oz (118.8 kg)  11/01/20 269 lb (122 kg)  10/05/20 263 lb 3.2 oz (119.4 kg)      Other studies Reviewed: Additional studies/ records that were reviewed today include:   Echocardiogram 05/2019: 1. Left ventricular ejection fraction, by estimation, is 60 to 65%. The  left ventricle has normal function. The left ventricle has no regional  wall motion abnormalities. There is mild left ventricular hypertrophy.  Left ventricular diastolic parameters  are consistent with Grade I diastolic dysfunction (impaired relaxation).   2. Right ventricular systolic function is normal. The right ventricular  size is normal.   3. The mitral valve is grossly normal. Trivial mitral valve  regurgitation.   4. The aortic valve was not well visualized. Aortic valve regurgitation  is not visualized.   5. The inferior vena cava is normal in size with greater than 50%  respiratory variability, suggesting right atrial pressure of 3 mmHg.     ASSESSMENT AND PLAN:  1. Acute on chronic diastolic CHF: LE edema and SOB have not changed significantly since increasing lasix. Her weight is down 8lbs from last week, though she is not monitoring her weights daily at home. She reports limiting salt intake. She has good UOP with the increased lasix. She has no significant pitting edema on exam today and lungs are clear - Will check a BNP to better assess volume status - Will check a BMET for close monitoring of kidney function and electrolytes - Will update an echocardiogram to re-evaluate LV function and wall motion. Low threshold for ischemic testing if symptoms persist despite ongoing diuresis given history of poorly controlled DM, HTN, and HLD - Continue lasix. Could consider  addition of metolazone - Continue  to encourage low salt diet and monitoring of daily weights - Encourage tylenol use for arthritis pains to minimize NSAID contributions to LE edema.  2. HTN: BP 150/72 today - high due to it being the first day of school (a little stressed today); typically SBP is in the 120s at home - Continue carvedilol and lasix  3. HLD: LDL 122 02/2020 - Continue crestor  4. AVNRT s/p ablation in 2017: has rare brief episodes of palpitations but no documented recurrent arrhythmias since ablation - Continue carvedilol  5. History of syncope: s/p ILR which has reached end of service. No recurrent syncope - Continue to monitor  6. DM type 2: Poorly controlled with A1C 9.2 09/2020, improved from 13 in 2021. SGLT2-inhibitor discussed at last visit and she was encouraged to speak with her endocrinologist about this which will be considered in the future per last office visit with Dr. Kelton Pillar 10/05/20. - Continue metformin and insulin per endocrinology    Current medicines are reviewed at length with the patient today.  The patient does not have concerns regarding medicines.  The following changes have been made:  As above  Labs/ tests ordered today include:   Orders Placed This Encounter  Procedures   Brain natriuretic peptide   Basic metabolic panel   ECHOCARDIOGRAM COMPLETE      Disposition:   FU with Dr. Sallyanne Kuster or myself in 2-3 months  Signed, Abigail Butts, PA-C  11/07/2020 5:13 PM

## 2020-11-07 ENCOUNTER — Other Ambulatory Visit: Payer: Self-pay

## 2020-11-07 ENCOUNTER — Ambulatory Visit (INDEPENDENT_AMBULATORY_CARE_PROVIDER_SITE_OTHER): Payer: BC Managed Care – PPO | Admitting: Medical

## 2020-11-07 ENCOUNTER — Encounter: Payer: Self-pay | Admitting: Registered Nurse

## 2020-11-07 ENCOUNTER — Encounter: Payer: Self-pay | Admitting: Medical

## 2020-11-07 VITALS — BP 150/72 | HR 82 | Ht 63.0 in | Wt 261.8 lb

## 2020-11-07 DIAGNOSIS — I5033 Acute on chronic diastolic (congestive) heart failure: Secondary | ICD-10-CM

## 2020-11-07 DIAGNOSIS — E785 Hyperlipidemia, unspecified: Secondary | ICD-10-CM

## 2020-11-07 DIAGNOSIS — Z794 Long term (current) use of insulin: Secondary | ICD-10-CM

## 2020-11-07 DIAGNOSIS — Z87898 Personal history of other specified conditions: Secondary | ICD-10-CM

## 2020-11-07 DIAGNOSIS — I10 Essential (primary) hypertension: Secondary | ICD-10-CM

## 2020-11-07 DIAGNOSIS — I471 Supraventricular tachycardia: Secondary | ICD-10-CM | POA: Diagnosis not present

## 2020-11-07 DIAGNOSIS — E1169 Type 2 diabetes mellitus with other specified complication: Secondary | ICD-10-CM

## 2020-11-07 DIAGNOSIS — I5032 Chronic diastolic (congestive) heart failure: Secondary | ICD-10-CM

## 2020-11-07 DIAGNOSIS — E1165 Type 2 diabetes mellitus with hyperglycemia: Secondary | ICD-10-CM

## 2020-11-07 NOTE — Patient Instructions (Signed)
Medication Instructions:  No Changes *If you need a refill on your cardiac medications before your next appointment, please call your pharmacy*   Lab Work: BMET, BNP, ( Today) If you have labs (blood work) drawn today and your tests are completely normal, you will receive your results only by: [place] (if you have MyChart) OR A paper copy in the mail If you have any lab test that is abnormal or we need to change your treatment, we will call you to review the results.   Testing/Procedures: [address] Your physician has requested that you have an echocardiogram. Echocardiography is a painless test that uses sound waves to create images of your heart. It provides your doctor with information about the size and shape of your heart and how well your heart's chambers and valves are working. This procedure takes approximately one hour. There are no restrictions for this procedure.    Follow-Up: At [facility], you and your health needs are our priority.  As part of our continuing mission to provide you with exceptional heart care, we have created designated Provider Care Teams.  These Care Teams include your primary Cardiologist (physician) and Advanced Practice Providers (APPs -  Physician Assistants and Nurse Practitioners) who all work together to provide you with the care you need, when you need it.  We recommend signing up for the patient portal called "MyChart".  Sign up information is provided on this After Visit Summary.  MyChart is used to connect with patients for Virtual Visits (Telemedicine).  Patients are able to view lab/test results, encounter notes, upcoming appointments, etc.  Non-urgent messages can be sent to your provider as well.   To learn more about what you can do with MyChart, go to [email or web address].    Your next appointment:   2 month(s)  The format for your next appointment:   In Person  Provider:   You may see Mihai  Croitoru, MDLow-Sodium Eating Plan Sodium, which is an element that makes up salt, helps you maintain a healthy balance of fluids in your body. Too much sodium can increase your bloodpressure and cause fluid and waste to be held in your body. Your health care provider or dietitian may recommend following this plan if you have high blood pressure (hypertension), kidney disease, liver disease, or heart failure. Eating less sodium can help lower your blood pressure, reduce swelling, and protect your heart, liver, andkidneys. What are tips for following this plan? Reading food labels The Nutrition Facts label lists the amount of sodium in one serving of the food. If you eat more than one serving, you must multiply the listed amount of sodium by the number of servings. Choose foods with less than 140 mg of sodium per serving. Avoid foods with 300 mg of sodium or more per serving. Shopping  Look for lower-sodium products, often labeled as "low-sodium" or "no salt added." Always check the sodium content, even if foods are labeled as "unsalted" or "no salt added." Buy fresh foods. Avoid canned foods and pre-made or frozen meals. Avoid canned, cured, or processed meats. Buy breads that have less than 80 mg of sodium per slice.  Cooking  Eat more home-cooked food and less restaurant, buffet, and fast food. Avoid adding salt when cooking. Use salt-free seasonings or herbs instead of table salt or sea salt. Check with your health care provider or pharmacist before using salt substitutes. Cook with plant-based oils, such as canola, sunflower, or olive oil.  Meal  planning When eating at a restaurant, ask that your food be prepared with less salt or no salt, if possible. Avoid dishes labeled as brined, pickled, cured, smoked, or made with soy sauce, miso, or teriyaki sauce. Avoid foods that contain MSG (monosodium glutamate). MSG is sometimes added to [place] food, bouillon, and some canned foods. Make  meals that can be grilled, baked, poached, roasted, or steamed. These are generally made with less sodium. General information Most people on this plan should limit their sodium intake to 1,500-2,000 mg (milligrams) of sodium each day. What foods should I eat? Fruits Fresh, frozen, or canned fruit. Fruit juice. Vegetables Fresh or frozen vegetables. "No salt added" canned vegetables. "No salt added"tomato sauce and paste. Low-sodium or reduced-sodium tomato and vegetable juice. Grains Low-sodium cereals, including oats, puffed wheat and rice, and shredded wheat. Low-sodium crackers. Unsalted rice. Unsalted pasta. Low-sodium bread.Whole-grain breads and whole-grain pasta. Meats and other proteins Fresh or frozen (no salt added) meat, poultry, seafood, and fish. Low-sodium canned tuna and salmon. Unsalted nuts. Dried peas, beans, and lentils withoutadded salt. Unsalted canned beans. Eggs. Unsalted nut butters. Dairy Milk. Soy milk. Cheese that is naturally low in sodium, such as ricotta cheese, fresh mozzarella, or Swiss cheese. Low-sodium or reduced-sodium cheese. Creamcheese. Yogurt. Seasonings and condiments Fresh and dried herbs and spices. Salt-free seasonings. Low-sodium mustard and ketchup. Sodium-free salad dressing. Sodium-free light mayonnaise. Fresh orrefrigerated horseradish. Lemon juice. Vinegar. Other foods Homemade, reduced-sodium, or low-sodium soups. Unsalted popcorn and pretzels.Low-salt or salt-free chips. The items listed above may not be a complete list of foods and beverages you can eat. Contact a dietitian for more information. What foods should I avoid? Vegetables Sauerkraut, pickled vegetables, and relishes. Olives. [place] fries. Onion rings. Regular canned vegetables (not low-sodium or reduced-sodium). Regular canned tomato sauce and paste (not low-sodium or reduced-sodium). Regular tomato and vegetable juice (not low-sodium or reduced-sodium). Frozenvegetables in  sauces. Grains Instant hot cereals. Bread stuffing, pancake, and biscuit mixes. Croutons. Seasoned rice or pasta mixes. Noodle soup cups. Boxed or frozen macaroni andcheese. Regular salted crackers. Self-rising flour. Meats and other proteins Meat or fish that is salted, canned, smoked, spiced, or pickled. Precooked or cured meat, such as sausages or meat loaves. Berniece Salines. Ham. Pepperoni. Hot dogs. Corned beef. Chipped beef. Salt pork. Jerky. Pickled herring. Anchovies andsardines. Regular canned tuna. Salted nuts. Dairy Processed cheese and cheese spreads. Hard cheeses. Cheese curds. Blue cheese.Feta cheese. String cheese. Regular cottage cheese. Buttermilk. Canned milk. Fats and oils Salted butter. Regular margarine. Ghee. Bacon fat. Seasonings and condiments Onion salt, garlic salt, seasoned salt, table salt, and sea salt. Canned and packaged gravies. Worcestershire sauce. Tartar sauce. Barbecue sauce. Teriyaki sauce. Soy sauce, including reduced-sodium. Steak sauce. Fish sauce. Oyster sauce. Cocktail sauce. Horseradish that you find on the shelf. Regular ketchup and mustard. Meat flavorings and tenderizers. Bouillon cubes. Hot sauce. Pre-made or packaged marinades. Pre-made or packaged taco seasonings. Relishes.Regular salad dressings. Salsa. Other foods Salted popcorn and pretzels. Corn chips and puffs. Potato and tortilla chips.Canned or dried soups. Pizza. Frozen entrees and pot pies. The items listed above may not be a complete list of foods and beverages you should avoid. Contact a dietitian for more information. Summary Eating less sodium can help lower your blood pressure, reduce swelling, and protect your heart, liver, and kidneys. Most people on this plan should limit their sodium intake to 1,500-2,000 mg (milligrams) of sodium each day. Canned, boxed, and frozen foods are high in sodium. Restaurant foods, fast foods, and  pizza are also very high in sodium. You also get sodium by adding  salt to food. Try to cook at home, eat more fresh fruits and vegetables, and eat less fast food and canned, processed, or prepared foods. This information is not intended to replace advice given to you by your health care provider. Make sure you discuss any questions you have with your healthcare provider. Document Revised: 04/03/2019 Document Reviewed: 01/28/2019 Elsevier Patient Education  2022 [employer].  or one of the following Electrical engineer on your designated Care Team:   [place], PA-C Fabian Sharp, [place] or  Roby Lofts, [place]

## 2020-11-08 LAB — BRAIN NATRIURETIC PEPTIDE: BNP: 7.7 pg/mL (ref 0.0–100.0)

## 2020-11-08 LAB — BASIC METABOLIC PANEL
BUN/Creatinine Ratio: 10 (ref 9–23)
BUN: 16 mg/dL (ref 6–24)
CO2: 25 mmol/L (ref 20–29)
Calcium: 10.6 mg/dL — ABNORMAL HIGH (ref 8.7–10.2)
Chloride: 101 mmol/L (ref 96–106)
Creatinine, Ser: 1.56 mg/dL — ABNORMAL HIGH (ref 0.57–1.00)
Glucose: 236 mg/dL — ABNORMAL HIGH (ref 65–99)
Potassium: 4.6 mmol/L (ref 3.5–5.2)
Sodium: 140 mmol/L (ref 134–144)
eGFR: 38 mL/min/{1.73_m2} — ABNORMAL LOW (ref >59)

## 2020-11-09 ENCOUNTER — Telehealth: Payer: Self-pay | Admitting: *Deleted

## 2020-11-09 NOTE — Telephone Encounter (Signed)
Prior auth for diclofenac 1% gel submitted to [facility] via CoverMyMeds.  Approved 11/07/2020 - 11/08/2023.

## 2020-11-11 LAB — DRUG TOX MONITOR 1 W/CONF, ORAL FLD
Amphetamines: NEGATIVE ng/mL (ref <10)
Barbiturates: NEGATIVE ng/mL (ref <10)
Benzodiazepines: NEGATIVE ng/mL (ref <0.50)
Buprenorphine: NEGATIVE ng/mL (ref <0.10)
Cocaine: NEGATIVE ng/mL (ref <5.0)
Codeine: NEGATIVE ng/mL (ref <2.5)
Dihydrocodeine: 43.9 ng/mL — ABNORMAL HIGH (ref <2.5)
Fentanyl: NEGATIVE ng/mL (ref <0.10)
Heroin Metabolite: NEGATIVE ng/mL (ref <1.0)
Hydrocodone: >250 ng/mL — ABNORMAL HIGH (ref <2.5)
Hydromorphone: NEGATIVE ng/mL (ref <2.5)
MARIJUANA: NEGATIVE ng/mL (ref <2.5)
MDMA: NEGATIVE ng/mL (ref <10)
Meprobamate: NEGATIVE ng/mL (ref <2.5)
Methadone: NEGATIVE ng/mL (ref <5.0)
Morphine: NEGATIVE ng/mL (ref <2.5)
Nicotine Metabolite: NEGATIVE ng/mL (ref <5.0)
Norhydrocodone: 11.3 ng/mL — ABNORMAL HIGH (ref <2.5)
Noroxycodone: NEGATIVE ng/mL (ref <2.5)
Opiates: POSITIVE ng/mL — AB (ref <2.5)
Oxycodone: NEGATIVE ng/mL (ref <2.5)
Oxymorphone: NEGATIVE ng/mL (ref <2.5)
Phencyclidine: NEGATIVE ng/mL (ref <10)
Tapentadol: NEGATIVE ng/mL (ref <5.0)
Tramadol: NEGATIVE ng/mL (ref <5.0)
Zolpidem: NEGATIVE ng/mL (ref <5.0)

## 2020-11-11 LAB — DRUG TOX ALC METAB W/CON, ORAL FLD: Alcohol Metabolite: NEGATIVE ng/mL (ref <25)

## 2020-11-15 ENCOUNTER — Telehealth: Payer: Self-pay | Admitting: Cardiovascular Disease

## 2020-11-15 ENCOUNTER — Encounter: Payer: Self-pay | Admitting: Internal Medicine

## 2020-11-15 NOTE — Telephone Encounter (Signed)
Pt sent a mychart message regarding nortriptyline. After further review, pt informed MD only recommended on 7/14 to reduce the use of diclofenac due to sodium/fluid rentention. Pt state she was unsure of the medication name but requesting if Dr. Loletha Grayer could send an approval to pcp to restart medication as she feel being off  hasn't made much of a difference in swelling. Pt state she is in pain and medication use to help with relief.   Will forward to MD

## 2020-11-15 NOTE — Telephone Encounter (Signed)
Pt c/o medication issue:  1. Name of Medication: patient can not remember the name 2. How are you currently taking this medication (dosage and times per day)? BID  3. Are you having a reaction (difficulty breathing--STAT)? No   4. What is your medication issue? Patient was taken of a medication and she do not remember the name of the medication.but she is asking can she get back on it, because she is in pain. Patient sent a message threw my chart.

## 2020-11-16 NOTE — Telephone Encounter (Signed)
Left a message for the patient to call back.   Croitoru, Mihai, MD  Meryl Crutch, RN 3 hours ago (9:16 AM)   The big disadvantage of diclofenac is that it lasts a whole 24 hours.  Before she restarts that medication, can we try and see if taking OTC ibuprofen 400 mg once daily in the morning works for her aches and pains. It wears off quicker, but should cover her for most of the day while she needs to be active. The side effects of fluid retention and injury to kidney function will wear off sooner and hopefully this will be a good compromise. OK to take acetaminophen at other times during the day.

## 2020-11-17 ENCOUNTER — Telehealth: Payer: Self-pay | Admitting: *Deleted

## 2020-11-17 NOTE — Telephone Encounter (Signed)
ERROR

## 2020-11-17 NOTE — Telephone Encounter (Signed)
Oral swab drug screen was consistent for prescribed medications.  ?

## 2020-11-17 NOTE — Progress Notes (Signed)
error 

## 2020-11-21 ENCOUNTER — Encounter: Payer: BC Managed Care – PPO | Admitting: Medical

## 2020-11-21 ENCOUNTER — Telehealth: Payer: BC Managed Care – PPO | Admitting: Medical

## 2020-11-21 ENCOUNTER — Encounter: Payer: Self-pay | Admitting: Medical

## 2020-11-21 ENCOUNTER — Other Ambulatory Visit: Payer: Self-pay

## 2020-11-21 VITALS — Wt 258.0 lb

## 2020-11-21 DIAGNOSIS — R059 Cough, unspecified: Secondary | ICD-10-CM

## 2020-11-21 DIAGNOSIS — U071 COVID-19: Secondary | ICD-10-CM | POA: Diagnosis not present

## 2020-11-21 MED ORDER — BENZONATATE 200 MG PO CAPS: 200.0000 mg | ORAL_CAPSULE | Freq: Three times a day (TID) | ORAL | 0 refills | Status: DC | PRN

## 2020-11-21 MED ORDER — MOLNUPIRAVIR EUA 200MG CAPSULE: 4.0000 | ORAL_CAPSULE | Freq: Two times a day (BID) | ORAL | 0 refills | Status: AC

## 2020-11-21 MED ORDER — EMERGEN-C IMMUNE PLUS PO PACK
1.0000 | PACK | Freq: Two times a day (BID) | ORAL | 1 refills | Status: DC
Start: 2020-11-21 — End: 2022-07-06

## 2020-11-21 NOTE — Progress Notes (Signed)
Subjective:     Patient ID: [name], female   DOB: [birth year], 60 y.o.   MRN: [id number]  This visit type was conducted due to national recommendations for restrictions regarding the COVID-19 Pandemic (e.g. social distancing) in an effort to limit this patient's exposure and mitigate transmission in our community.  Due to their co-morbid illnesses, this patient is at least at moderate risk for complications without adequate follow up.  This format is felt to be most appropriate for this patient at this time.    Documentation for virtual audio and video telecommunications through [place] encounter:  The patient was located at home. The provider was located in the office. The patient did consent to this visit and is aware of possible charges through their insurance for this visit.  The other persons participating in this telemedicine service were none. Time spent on call was 20 minutes and in review of previous records 20 minutes total.  This virtual service is not related to other E/M service within previous 7 days.   HPI Chief Complaint  Patient presents with   Covid Positive    Positive for covid--today. Symptoms started Saturday. Fever, headache, cough, body aches, runny noses.    Virtual consult for COVID infection.  Symptoms started 2 days ago.  She tested positive with a COVID test today.  She reports symptoms including fever 100.9 but was up to 101 last night.  She notes a little short of breath, cough, pretty bad headache, body aches, runny nose.  No change in taste or smell, no nausea, no vomiting, no sore throat.  No sick contacts.  Not using anything for symptoms so far.  No prior COVID infection.  She feels like she is hydrating okay.  No edema.  No chest pain.  She has had COVID vaccination.  Past Medical History:  Diagnosis Date   Abscess    increased drainage from abscess on buttock   Anal fistula    Anxiety    Bilateral hip pain 05/27/2015   Chronic  pain syndrome 05/27/2015   Depression    sees [name]   Diabetes mellitus without complication ([place])    Diverticulosis    Elevated alkaline phosphatase level    Fatty liver    Gallstones    Hiatal hernia    Hyperlipidemia    Hypertension    Internal hemorrhoids    Sleep apnea    2008- sleep study, neg. for sleep apnea    Stroke [facility])    SVT (supraventricular tachycardia) ([place])    Symptomatic cholelithiasis 09/11/2018    Review of Systems As in subjective      Objective:   Physical Exam Due to coronavirus pandemic stay at home measures, patient visit was virtual and they were not examined in person.   Wt 258 lb (117 kg)   LMP 02/09/2017   BMI 45.70 kg/m   Gen: wd, wn, nad, congestion sounding No labored breathing, no obvious wheezing     Assessment:     Encounter Diagnoses  Name Primary?   COVID-19 virus infection Yes   Cough          Plan:     We discussed the limitations of virtual consult  Given her high risk status, begin on Molnapiravir.  We discussed risk and benefits and proper use of medication. Begin vitamin pack as below, cough medicine as below. We discussed importance of rest and hydration.  Your recent coronavirus/Covid 19 test result was indeed positive!    General  recommendations: I recommend you rest, hydrate well with water and clear fluids throughout the day.   You can use Tylenol for pain or fever You can use over the counter Emetrol for nausea.     If you are having trouble breathing, if you are very weak, have high fever 103 or higher consistently despite Tylenol, or uncontrollable nausea and vomiting, then call or go to the emergency department.    If you have other questions or have other symptoms or questions you are concerned about then please make a virtual visit  Covid symptoms such as fatigue and cough can linger over 2 weeks, even after the initial fever, aches, chills, and other initial symptoms.   Self  Quarantine: The CDC, Centers for Disease Control has recommended a self quarantine of 5 days from the start of your illness until you are symptom-free including at least 24 hours of no symptoms including no fever, no shortness of breath, and no body aches and chills, by day 5 before returning to work or general contact with the public.  What does self quarantine mean: avoiding contact with people as much as possible.   Particularly in your house, isolate your self from others in a separate room, wear a mask when possible in the room, particularly if coughing a lot.   Have others bring food, water, medications, etc., to your door, but avoid direct contact with your household contacts during this time to avoid spreading the infection to them.   If you have a separate bathroom and living quarters during the next 2 weeks away from others, that would be preferable.    If you can't completely isolate, then wear a mask, wash hands frequently with soap and water for at least 15 seconds, minimize close contact with others, and have a friend or family member check regularly from a distance to make sure you are not getting seriously worse.     You should not be going out in public, should not be going to stores, to work or other public places until all your symptoms have resolved and at least 5 days + 24 hours of no symptoms at all have transpired.   Ideally you should avoid contact with others for a full 5 days if possible.  One of the goals is to limit spread to high risk people; people that are older and elderly, people with multiple health issues like diabetes, heart disease, lung disease, and anybody that has weakened immune systems such as people with cancer or on immunosuppressive therapy.     [name] was seen today for covid positive.  Diagnoses and all orders for this visit:  COVID-19 virus infection  Cough  Other orders -     molnupiravir EUA 200 mg CAPS; Take 4 capsules (800 mg total) by mouth 2  (two) times daily for 5 days. -     Multiple Vitamins-Minerals (EMERGEN-C IMMUNE PLUS) PACK; Take 1 tablet by mouth 2 (two) times daily. -     benzonatate (TESSALON) 200 MG capsule; Take 1 capsule (200 mg total) by mouth 3 (three) times daily as needed for cough.  F/u with call back in 5 days by Friday

## 2020-11-25 NOTE — Telephone Encounter (Signed)
Spoke with the patient. She stated that she was already made aware.

## 2020-11-30 ENCOUNTER — Other Ambulatory Visit: Payer: Self-pay | Admitting: Medical

## 2020-11-30 MED ORDER — BUDESONIDE-FORMOTEROL FUMARATE 160-4.5 MCG/ACT IN AERO: 2.0000 | INHALATION_SPRAY | Freq: Two times a day (BID) | RESPIRATORY_TRACT | 0 refills | Status: DC

## 2020-12-01 ENCOUNTER — Encounter: Payer: Self-pay | Admitting: Medical

## 2020-12-05 ENCOUNTER — Ambulatory Visit: Payer: BC Managed Care – PPO | Admitting: Obstetrics and Gynecology

## 2020-12-08 ENCOUNTER — Other Ambulatory Visit: Payer: Self-pay | Admitting: Registered Nurse

## 2020-12-08 ENCOUNTER — Other Ambulatory Visit: Payer: Self-pay

## 2020-12-08 ENCOUNTER — Ambulatory Visit (HOSPITAL_COMMUNITY): Payer: BC Managed Care – PPO | Attending: Cardiology

## 2020-12-08 DIAGNOSIS — E785 Hyperlipidemia, unspecified: Secondary | ICD-10-CM | POA: Insufficient documentation

## 2020-12-08 DIAGNOSIS — I471 Supraventricular tachycardia: Secondary | ICD-10-CM | POA: Insufficient documentation

## 2020-12-08 DIAGNOSIS — I5033 Acute on chronic diastolic (congestive) heart failure: Secondary | ICD-10-CM

## 2020-12-08 DIAGNOSIS — Z87898 Personal history of other specified conditions: Secondary | ICD-10-CM | POA: Diagnosis present

## 2020-12-08 DIAGNOSIS — E1165 Type 2 diabetes mellitus with hyperglycemia: Secondary | ICD-10-CM | POA: Insufficient documentation

## 2020-12-08 DIAGNOSIS — Z794 Long term (current) use of insulin: Secondary | ICD-10-CM | POA: Insufficient documentation

## 2020-12-08 DIAGNOSIS — E1169 Type 2 diabetes mellitus with other specified complication: Secondary | ICD-10-CM | POA: Diagnosis present

## 2020-12-08 DIAGNOSIS — I10 Essential (primary) hypertension: Secondary | ICD-10-CM | POA: Insufficient documentation

## 2020-12-08 LAB — ECHOCARDIOGRAM COMPLETE
Area-P 1/2: 3.81 cm2
S' Lateral: 2 cm

## 2020-12-08 NOTE — Telephone Encounter (Signed)
This provider call [name] regarding her methocarbamol, she responded via My-Chart message. Methocarbamol ordered, she is aware via My-Chart Message.

## 2020-12-11 ENCOUNTER — Other Ambulatory Visit: Payer: Self-pay | Admitting: Medical

## 2020-12-17 ENCOUNTER — Telehealth: Payer: Self-pay

## 2020-12-20 ENCOUNTER — Encounter: Payer: Self-pay | Admitting: Internal Medicine

## 2020-12-22 ENCOUNTER — Other Ambulatory Visit: Payer: Self-pay | Admitting: Internal Medicine

## 2020-12-22 ENCOUNTER — Other Ambulatory Visit: Payer: Self-pay | Admitting: Endocrinology

## 2020-12-28 ENCOUNTER — Encounter: Payer: Self-pay | Admitting: Registered Nurse

## 2020-12-28 ENCOUNTER — Other Ambulatory Visit: Payer: Self-pay

## 2020-12-28 ENCOUNTER — Encounter: Payer: BC Managed Care – PPO | Attending: Physical Medicine & Rehabilitation | Admitting: Registered Nurse

## 2020-12-28 VITALS — BP 155/79 | HR 84 | Temp 98.2°F | Ht 63.0 in | Wt 266.4 lb

## 2020-12-28 DIAGNOSIS — G894 Chronic pain syndrome: Secondary | ICD-10-CM | POA: Insufficient documentation

## 2020-12-28 DIAGNOSIS — M7062 Trochanteric bursitis, left hip: Secondary | ICD-10-CM | POA: Insufficient documentation

## 2020-12-28 DIAGNOSIS — G8929 Other chronic pain: Secondary | ICD-10-CM | POA: Insufficient documentation

## 2020-12-28 DIAGNOSIS — Z79891 Long term (current) use of opiate analgesic: Secondary | ICD-10-CM | POA: Insufficient documentation

## 2020-12-28 DIAGNOSIS — M5416 Radiculopathy, lumbar region: Secondary | ICD-10-CM | POA: Diagnosis not present

## 2020-12-28 DIAGNOSIS — M545 Low back pain, unspecified: Secondary | ICD-10-CM | POA: Diagnosis not present

## 2020-12-28 DIAGNOSIS — Z5181 Encounter for therapeutic drug level monitoring: Secondary | ICD-10-CM | POA: Diagnosis present

## 2020-12-28 DIAGNOSIS — M7061 Trochanteric bursitis, right hip: Secondary | ICD-10-CM | POA: Diagnosis not present

## 2020-12-28 MED ORDER — METHYLPREDNISOLONE 4 MG PO TBPK: ORAL_TABLET | ORAL | 0 refills | Status: DC

## 2020-12-28 MED ORDER — HYDROCODONE-ACETAMINOPHEN 10-325 MG PO TABS: 1.0000 | ORAL_TABLET | Freq: Every day | ORAL | 0 refills | Status: DC | PRN

## 2020-12-28 NOTE — Patient Instructions (Signed)
Send a My Chart Message in Two weeks with a update on medication change.   You may increase your Methocarbamol to three times a day as needed for muscle spasms.

## 2020-12-28 NOTE — Progress Notes (Signed)
Subjective:    Patient ID: [name], female    DOB: Oct 21, 1960, 60 y.o.   MRN: [id number]  HPI: [name] is a 60 y.o. female who returns for follow up appointment for chronic pain and medication refill. She states her pain is located in her lower back radiating into her bilateral lower extremities L>R. Also reports her lumbar radicular pain has increased in intensity since her diclofenac was discontinued by cardiology. At this time she is only receiving 3 hours of relief of her pain with current medication regimen. Hydrocodone increased to 5 times a day as needed for pain, she was instructed to keep a pain journal and send a My-Chart message in two weeks with a update, she verbalizes understanding. She rates her pain 9. current exercise regime is walking and performing stretching exercises.  [name] Morphine equivalent wass 40.00 MME. Changing to 50.00 MME. Last UDS was Performed on 11/01/2020, it was consistent.        Pain Inventory Average Pain 8 Pain Right Now 9 My pain is burning, dull, tingling, and aching  In the last 24 hours, has pain interfered with the following? General activity 1 Relation with others 1 Enjoyment of life 1 What TIME of day is your pain at its worst? daytime and evening Sleep (in general) Poor  Pain is worse with: walking, standing, and some activites Pain improves with: medication Relief from Meds: 3  Family History  Problem Relation Age of Onset   Hypertension Mother    Alzheimer's disease Mother    Diabetes Father    Breast cancer Sister 87   Anesthesia problems Neg Hx    Hypotension Neg Hx    Malignant hyperthermia Neg Hx    Pseudochol deficiency Neg Hx    Colon cancer Neg Hx    Esophageal cancer Neg Hx    Stomach cancer Neg Hx    Rectal cancer Neg Hx    Thyroid disease Neg Hx    Social History   Socioeconomic History   Marital status: Divorced    Spouse name: Not on file   Number of children: 0   Years of  education: Not on file   Highest education level: Not on file  Occupational History   Occupation: TEACHER ASSISTANT  Tobacco Use   Smoking status: Never   Smokeless tobacco: Never  Vaping Use   Vaping Use: Never used  Substance and Sexual Activity   Alcohol use: No   Drug use: No   Sexual activity: Yes  Other Topics Concern   Not on file  Social History Narrative   Not on file   Social Determinants of Health   Financial Resource Strain: Not on file  Food Insecurity: Not on file  Transportation Needs: Not on file  Physical Activity: Not on file  Stress: Not on file  Social Connections: Not on file   Past Surgical History:  Procedure Laterality Date   ANAL EXAMINATION UNDER ANESTHESIA  02/21/11   anal fistula   BREAST SURGERY  patient does not remember date of procedure   pull fluid off lft br   CHOLECYSTECTOMY N/A 09/12/2018   Procedure: LAPAROSCOPIC CHOLECYSTECTOMY;  Surgeon: Stark Klein, MD;  Location: [place];  Service: General;  Laterality: N/A;   ELECTROPHYSIOLOGIC STUDY N/A 05/05/2015   Procedure: SVT Ablation;  Surgeon: Will Meredith Leeds, MD;  Location: [place] [facility];  Service: Cardiovascular;  Laterality: N/A;   EP IMPLANTABLE DEVICE N/A 01/30/2016   Procedure: Loop Recorder Insertion;  Surgeon: Sanda Klein, MD;  Location: [place] [facility];  Service: Cardiovascular;  Laterality: N/A;   INCISE AND DRAIN ABCESS     abscess on right thigh and buttock   KNEE ARTHROSCOPY     left   LAPAROSCOPIC APPENDECTOMY N/A 09/19/2018   Procedure: APPENDECTOMY LAPAROSCOPIC;  Surgeon: Clovis Riley, MD;  Location: [place] [facility];  Service: General;  Laterality: N/A;   SHOULDER SURGERY  04/14/09   right   Past Surgical History:  Procedure Laterality Date   ANAL EXAMINATION UNDER ANESTHESIA  02/21/11   anal fistula   BREAST SURGERY  patient does not remember date of procedure   pull fluid off lft br   CHOLECYSTECTOMY N/A 09/12/2018   Procedure: LAPAROSCOPIC CHOLECYSTECTOMY;   Surgeon: Stark Klein, MD;  Location: [place];  Service: General;  Laterality: N/A;   ELECTROPHYSIOLOGIC STUDY N/A 05/05/2015   Procedure: SVT Ablation;  Surgeon: Will Meredith Leeds, MD;  Location: [place] [facility];  Service: Cardiovascular;  Laterality: N/A;   EP IMPLANTABLE DEVICE N/A 01/30/2016   Procedure: Loop Recorder Insertion;  Surgeon: Sanda Klein, MD;  Location: [place] [facility];  Service: Cardiovascular;  Laterality: N/A;   INCISE AND DRAIN ABCESS     abscess on right thigh and buttock   KNEE ARTHROSCOPY     left   LAPAROSCOPIC APPENDECTOMY N/A 09/19/2018   Procedure: APPENDECTOMY LAPAROSCOPIC;  Surgeon: Clovis Riley, MD;  Location: [place];  Service: General;  Laterality: N/A;   SHOULDER SURGERY  04/14/09   right   Past Medical History:  Diagnosis Date   Abscess    increased drainage from abscess on buttock   Anal fistula    Anxiety    Bilateral hip pain 05/27/2015   Chronic pain syndrome 05/27/2015   Depression    sees [name]   Diabetes mellitus without complication [facility])    Diverticulosis    Elevated alkaline phosphatase level    Fatty liver    Gallstones    Hiatal hernia    Hyperlipidemia    Hypertension    Internal hemorrhoids    Sleep apnea    2008- sleep study, neg. for sleep apnea    Stroke [facility])    SVT (supraventricular tachycardia) ([facility])    Symptomatic cholelithiasis 09/11/2018   Temp 98.2 F (36.8 C)   Ht 5\' 3"  (1.6 m)   Wt 266 lb 6.4 oz (120.8 kg)   LMP 02/09/2017   BMI 47.19 kg/m   Opioid Risk Score:   Fall Risk Score:  `1  Depression screen PHQ 2/9  Depression screen [facility] 2/9 11/21/2020 09/02/2020 06/23/2020 02/29/2020 09/03/2019 07/06/2019 06/30/2019  Decreased Interest 0 0 0 0 0 1 0  Down, Depressed, Hopeless 0 0 0 0 0 1 0  PHQ - 2 Score 0 0 0 0 0 2 0  Altered sleeping - - - - 1 1 -  Tired, decreased energy - - - - 1 2 -  Change in appetite - - - - 0 0 -  Feeling bad or failure about yourself  - - - - 0 0 -  Trouble concentrating  - - - - 0 1 -  Moving slowly or fidgety/restless - - - - 0 0 -  Suicidal thoughts - - - - 0 0 -  PHQ-9 Score - - - - 2 6 -  Difficult doing work/chores - - - - - Not difficult at all -  Some recent data might be hidden  Review of Systems  Constitutional: Negative.   HENT: Negative.    Eyes: Negative.   Respiratory: Negative.    Cardiovascular: Negative.   Gastrointestinal: Negative.   Endocrine: Negative.   Genitourinary: Negative.   Musculoskeletal:  Positive for back pain.  Skin: Negative.   Allergic/Immunologic: Negative.   Neurological: Negative.   Hematological: Negative.   Psychiatric/Behavioral: Negative.        Objective:   Physical Exam Vitals and nursing note reviewed.  Constitutional:      Appearance: Normal appearance.  Cardiovascular:     Rate and Rhythm: Normal rate and regular rhythm.     Pulses: Normal pulses.     Heart sounds: Normal heart sounds.  Pulmonary:     Effort: Pulmonary effort is normal.     Breath sounds: Normal breath sounds.  Musculoskeletal:     Cervical back: Normal range of motion and neck supple.     Right lower leg: Edema present.     Left lower leg: Edema present.     Comments: Normal Muscle Bulk and Muscle Testing Reveals:  Upper Extremities: Full ROM and Muscle Strength 5/5 Lumbar Hypersensitivity Greater Trochanter Tenderness: L>R Lower Extremities Decreased ROM and Muscle Strength 5/5 Bilateral Lower Extremities Flexion Produces Pain into her lower extremities Arises from table slowly Antalgic Gait     Skin:    General: Skin is warm and dry.  Neurological:     Mental Status: She is alert and oriented to person, place, and time.  Psychiatric:        Mood and Affect: Mood normal.        Behavior: Behavior normal.         Assessment & Plan:  1.Chronic Bilateral Hip pain L>R---endstage OA of hips..Continue HEP as tolerated and Heat and Ice Therapy. Refilled:  Increased: Hydrocodone 10/325 one tablet 5 times a day  as needed for moderate pain #150. 12/28/2020 We will continue the opioid monitoring program, this consists of regular clinic visits, examinations, urine drug screen, pill counts as well as use of [place] Controlled Substance Reporting system. A 12 month History has been reviewed on the [place] Controlled Substance Reporting System on 12/28/2020 2. Lumbar Radiculitis:  Continue medication regimen with Pamelor. Continue Cymbalta and Continue  HEP as Tolerated.  Continue to Monitor. 12/28/2020.          -              3. Morbid obesity: Continue with healthy diet Regime and HEP. Encouraged to continue with healthy diet regime and HEP. 12/28/2020 4.Bilateral  Greater Trochanteric Bursitis:.Continue with Heat and Ice Therapy. Continue to Monitor. 12/28/2020. 5. Left Knee Pain: No complaints Today.Continue with HEP as Tolerated. Continue to Monitor. 12/28/2020 6. Muscle Spasm: Continue  Methocarbamol. Continue to monitor. 10/19 /2022.      F/U in 2 months

## 2020-12-30 NOTE — Telephone Encounter (Signed)
P.A. approved til 12/17/21, faxed pharmacy

## 2021-01-09 ENCOUNTER — Ambulatory Visit
Admission: RE | Admit: 2021-01-09 | Discharge: 2021-01-09 | Disposition: A | Discharge status: Home / Self Care | Payer: BC Managed Care – PPO | Source: Ambulatory Visit | Attending: Registered Nurse | Admitting: Registered Nurse

## 2021-01-11 ENCOUNTER — Telehealth: Payer: Self-pay | Admitting: Registered Nurse

## 2021-01-11 MED ORDER — HYDROCODONE-ACETAMINOPHEN 10-325 MG PO TABS: 1.0000 | ORAL_TABLET | Freq: Every day | ORAL | 0 refills | Status: DC | PRN

## 2021-01-11 NOTE — Telephone Encounter (Signed)
X-ray results was reviewed with Dr Naaman Plummer today, he agrees with MBB. Placed a call to Ms. Aultman regarding her Lumbar X-ray results and treatment for Lumbar Facet Hypertrophy, she agrees to have Left Medial Branch Block, Ms. Hodgens pain is on the left side. She will be scheduled with Dr Letta Pate in January, she verbalizes understanding.

## 2021-01-29 ENCOUNTER — Other Ambulatory Visit: Payer: Self-pay | Admitting: Medical

## 2021-01-30 ENCOUNTER — Other Ambulatory Visit: Payer: Self-pay

## 2021-01-30 ENCOUNTER — Telehealth: Payer: Self-pay

## 2021-01-30 MED ORDER — DULOXETINE HCL 60 MG PO CPEP: 60.0000 mg | ORAL_CAPSULE | Freq: Two times a day (BID) | ORAL | 0 refills | Status: DC

## 2021-01-30 NOTE — Telephone Encounter (Signed)
Received fax from [facility] for a refill on Duloxetine last apt was 11/21/20.

## 2021-01-31 ENCOUNTER — Other Ambulatory Visit: Payer: Self-pay

## 2021-01-31 ENCOUNTER — Ambulatory Visit (INDEPENDENT_AMBULATORY_CARE_PROVIDER_SITE_OTHER): Payer: BC Managed Care – PPO | Admitting: Cardiovascular Disease

## 2021-01-31 ENCOUNTER — Encounter: Payer: Self-pay | Admitting: Cardiovascular Disease

## 2021-01-31 VITALS — BP 136/65 | HR 96 | Ht 63.0 in | Wt 259.0 lb

## 2021-01-31 DIAGNOSIS — Z9889 Other specified postprocedural states: Secondary | ICD-10-CM | POA: Diagnosis not present

## 2021-01-31 DIAGNOSIS — I5032 Chronic diastolic (congestive) heart failure: Secondary | ICD-10-CM

## 2021-01-31 DIAGNOSIS — E785 Hyperlipidemia, unspecified: Secondary | ICD-10-CM

## 2021-01-31 DIAGNOSIS — Z87898 Personal history of other specified conditions: Secondary | ICD-10-CM | POA: Diagnosis not present

## 2021-01-31 DIAGNOSIS — Z794 Long term (current) use of insulin: Secondary | ICD-10-CM

## 2021-01-31 DIAGNOSIS — E1169 Type 2 diabetes mellitus with other specified complication: Secondary | ICD-10-CM

## 2021-01-31 DIAGNOSIS — E0590 Thyrotoxicosis, unspecified without thyrotoxic crisis or storm: Secondary | ICD-10-CM

## 2021-01-31 DIAGNOSIS — Z8673 Personal history of transient ischemic attack (TIA), and cerebral infarction without residual deficits: Secondary | ICD-10-CM

## 2021-01-31 DIAGNOSIS — E1165 Type 2 diabetes mellitus with hyperglycemia: Secondary | ICD-10-CM

## 2021-01-31 NOTE — Patient Instructions (Signed)
Medication Instructions:  No changes *If you need a refill on your cardiac medications before your next appointment, please call your pharmacy*   Lab Work: None ordered If you have labs (blood work) drawn today and your tests are completely normal, you will receive your results only by: [name] (if you have MyChart) OR A paper copy in the mail If you have any lab test that is abnormal or we need to change your treatment, we will call you to review the results.   Testing/Procedures: None ordered   Follow-Up: At [facility], you and your health needs are our priority.  As part of our continuing mission to provide you with exceptional heart care, we have created designated Provider Care Teams.  These Care Teams include your primary Cardiologist (physician) and Advanced Practice Providers (APPs -  Physician Assistants and Nurse Practitioners) who all work together to provide you with the care you need, when you need it.  We recommend signing up for the patient portal called "MyChart".  Sign up information is provided on this After Visit Summary.  MyChart is used to connect with patients for Virtual Visits (Telemedicine).  Patients are able to view lab/test results, encounter notes, upcoming appointments, etc.  Non-urgent messages can be sent to your provider as well.   To learn more about what you can do with MyChart, go to [email or web address].    Your next appointment:   12 month(s)  The format for your next appointment:   In Person  Provider:   Sanda Klein, MD     Other Instructions Low-Sodium Eating Plan Sodium, which is an element that makes up salt, helps you maintain a healthy balance of fluids in your body. Too much sodium can increase your blood pressure and cause fluid and waste to be held in your body. Your health care provider or dietitian may recommend following this plan if you have high blood pressure (hypertension), kidney disease, liver disease, or  heart failure. Eating less sodium can help lower your blood pressure, reduce swelling, and protect your heart, liver, and kidneys. What are tips for following this plan? Reading food labels The Nutrition Facts label lists the amount of sodium in one serving of the food. If you eat more than one serving, you must multiply the listed amount of sodium by the number of servings. Choose foods with less than 140 mg of sodium per serving. Avoid foods with 300 mg of sodium or more per serving. Shopping  Look for lower-sodium products, often labeled as "low-sodium" or "no salt added." Always check the sodium content, even if foods are labeled as "unsalted" or "no salt added." Buy fresh foods. Avoid canned foods and pre-made or frozen meals. Avoid canned, cured, or processed meats. Buy breads that have less than 80 mg of sodium per slice. Cooking  Eat more home-cooked food and less restaurant, buffet, and fast food. Avoid adding salt when cooking. Use salt-free seasonings or herbs instead of table salt or sea salt. Check with your health care provider or pharmacist before using salt substitutes. Cook with plant-based oils, such as canola, sunflower, or olive oil. Meal planning When eating at a restaurant, ask that your food be prepared with less salt or no salt, if possible. Avoid dishes labeled as brined, pickled, cured, smoked, or made with soy sauce, miso, or teriyaki sauce. Avoid foods that contain MSG (monosodium glutamate). MSG is sometimes added to [place] food, bouillon, and some canned foods. Make meals that can be grilled, baked, poached,  roasted, or steamed. These are generally made with less sodium. General information Most people on this plan should limit their sodium intake to 1,500-2,000 mg (milligrams) of sodium each day. What foods should I eat? Fruits Fresh, frozen, or canned fruit. Fruit juice. Vegetables Fresh or frozen vegetables. "No salt added" canned vegetables. "No salt  added" tomato sauce and paste. Low-sodium or reduced-sodium tomato and vegetable juice. Grains Low-sodium cereals, including oats, puffed wheat and rice, and shredded wheat. Low-sodium crackers. Unsalted rice. Unsalted pasta. Low-sodium bread. Whole-grain breads and whole-grain pasta. Meats and other proteins Fresh or frozen (no salt added) meat, poultry, seafood, and fish. Low-sodium canned tuna and salmon. Unsalted nuts. Dried peas, beans, and lentils without added salt. Unsalted canned beans. Eggs. Unsalted nut butters. Dairy Milk. Soy milk. Cheese that is naturally low in sodium, such as ricotta cheese, fresh mozzarella, or Swiss cheese. Low-sodium or reduced-sodium cheese. Cream cheese. Yogurt. Seasonings and condiments Fresh and dried herbs and spices. Salt-free seasonings. Low-sodium mustard and ketchup. Sodium-free salad dressing. Sodium-free light mayonnaise. Fresh or refrigerated horseradish. Lemon juice. Vinegar. Other foods Homemade, reduced-sodium, or low-sodium soups. Unsalted popcorn and pretzels. Low-salt or salt-free chips. The items listed above may not be a complete list of foods and beverages you can eat. Contact a dietitian for more information. What foods should I avoid? Vegetables Sauerkraut, pickled vegetables, and relishes. Olives. [place] fries. Onion rings. Regular canned vegetables (not low-sodium or reduced-sodium). Regular canned tomato sauce and paste (not low-sodium or reduced-sodium). Regular tomato and vegetable juice (not low-sodium or reduced-sodium). Frozen vegetables in sauces. Grains Instant hot cereals. Bread stuffing, pancake, and biscuit mixes. Croutons. Seasoned rice or pasta mixes. Noodle soup cups. Boxed or frozen macaroni and cheese. Regular salted crackers. Self-rising flour. Meats and other proteins Meat or fish that is salted, canned, smoked, spiced, or pickled. Precooked or cured meat, such as sausages or meat loaves. Berniece Salines. Ham. Pepperoni. Hot dogs.  Corned beef. Chipped beef. Salt pork. Jerky. Pickled herring. Anchovies and sardines. Regular canned tuna. Salted nuts. Dairy Processed cheese and cheese spreads. Hard cheeses. Cheese curds. Blue cheese. Feta cheese. String cheese. Regular cottage cheese. Buttermilk. Canned milk. Fats and oils Salted butter. Regular margarine. Ghee. Bacon fat. Seasonings and condiments Onion salt, garlic salt, seasoned salt, table salt, and sea salt. Canned and packaged gravies. Worcestershire sauce. Tartar sauce. Barbecue sauce. Teriyaki sauce. Soy sauce, including reduced-sodium. Steak sauce. Fish sauce. Oyster sauce. Cocktail sauce. Horseradish that you find on the shelf. Regular ketchup and mustard. Meat flavorings and tenderizers. Bouillon cubes. Hot sauce. Pre-made or packaged marinades. Pre-made or packaged taco seasonings. Relishes. Regular salad dressings. Salsa. Other foods Salted popcorn and pretzels. Corn chips and puffs. Potato and tortilla chips. Canned or dried soups. Pizza. Frozen entrees and pot pies. The items listed above may not be a complete list of foods and beverages you should avoid. Contact a dietitian for more information. Summary Eating less sodium can help lower your blood pressure, reduce swelling, and protect your heart, liver, and kidneys. Most people on this plan should limit their sodium intake to 1,500-2,000 mg (milligrams) of sodium each day. Canned, boxed, and frozen foods are high in sodium. Restaurant foods, fast foods, and pizza are also very high in sodium. You also get sodium by adding salt to food. Try to cook at home, eat more fresh fruits and vegetables, and eat less fast food and canned, processed, or prepared foods. This information is not intended to replace advice given to you by your health  care provider. Make sure you discuss any questions you have with your health care provider. Document Revised: 04/03/2019 Document Reviewed: 01/28/2019 Elsevier Patient Education   2022 [employer].

## 2021-01-31 NOTE — Progress Notes (Signed)
Patient ID: [name], female   DOB: 17-Apr-1960, 60 y.o.   MRN: [id number] .    Cardiology Office Note    Date:  02/04/2021   ID:  [name], DOB [birth year], MRN [id number]  PCP:  Carlena Hurl, PA-C  Cardiologist:   Sanda Klein, MD   Chief Complaint  Patient presents with   Congestive Heart Failure    History of Present Illness:  [name] is a 60 y.o. 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  In July she presented with dyspnea on exertion and worsening edema in the setting of daily nonsteroidal anti-inflammatory drug use.  Her echocardiogram in March 2021 showed clear evidence of diastolic dysfunction and borderline criteria for elevated filling pressures (E/e' 15-16).  No major valvular abnormalities were seen.  After increasing her dose of furosemide she is doing much better.  She has lost about 20 pounds since her appointment in July and is 4 pounds lighter than she was at the appointment in late August.  In August her BNP was down to only 7.7.  Her lower extremity edema has largely resolved.  She has NYHA functional class II exertional dyspnea, back to her usual baseline.  She does not have any or PND.  She has subsequently reduced the furosemide back down to 80 mg daily.   She continues to struggle with glycemic control, but there has been major improvement in her A1c from 13% in April down to 9.2% in July.  Currently takes metformin, [name] and Fiasp.  She has a follow-up with her endocrinologist in just a few more days.  Significant comorbid conditions include morbid obesity, presumptive obstructive sleep apnea, treated hyperlipidemia and type 2 diabetes mellitus on insulin.  She has normal left  ventricular systolic function, but had evidence of diastolic dysfunction and elevated filling pressures on echo in March 2021. She has a goiter and low TSH with normal FT4.  She had a negative sleep study in 2019  Past Medical History:  Diagnosis Date   Abscess    increased drainage from abscess on buttock   Anal fistula    Anxiety    Bilateral hip pain 05/27/2015   Chronic pain syndrome 05/27/2015   Depression    sees [name]   Diabetes mellitus without complication ([place])    Diverticulosis    Elevated alkaline phosphatase level    Fatty liver    Gallstones    Hiatal hernia    Hyperlipidemia    Hypertension    Internal hemorrhoids    Sleep apnea    2008- sleep study, neg. for sleep apnea    Stroke [facility])    SVT (supraventricular tachycardia) ([place])    Symptomatic cholelithiasis 09/11/2018    Past Surgical History:  Procedure Laterality Date   ANAL EXAMINATION UNDER ANESTHESIA  02/21/11   anal fistula   BREAST SURGERY  patient does not remember date of procedure   pull fluid off lft br   CHOLECYSTECTOMY N/A 09/12/2018   Procedure: LAPAROSCOPIC CHOLECYSTECTOMY;  Surgeon: Stark Klein, MD;  Location: [facility];  Service: General;  Laterality: N/A;   ELECTROPHYSIOLOGIC STUDY N/A 05/05/2015   Procedure: SVT Ablation;  Surgeon: Will Meredith Leeds, MD;  Location: [facility];  Service: Cardiovascular;  Laterality: N/A;   EP  IMPLANTABLE DEVICE N/A 01/30/2016   Procedure: Loop Recorder Insertion;  Surgeon: Sanda Klein, MD;  Location: [facility];  Service: Cardiovascular;  Laterality: N/A;   INCISE AND DRAIN ABCESS     abscess on right thigh and buttock   KNEE ARTHROSCOPY     left   LAPAROSCOPIC APPENDECTOMY N/A 09/19/2018   Procedure: APPENDECTOMY LAPAROSCOPIC;  Surgeon: Clovis Riley, MD;  Location: [facility];  Service: General;  Laterality: N/A;   SHOULDER SURGERY  04/14/09   right    Outpatient Medications Prior to Visit  Medication Sig Dispense Refill   aspirin  81 MG EC tablet Take 1 tablet (81 mg total) by mouth daily. 90 tablet 3   budesonide-formoterol (SYMBICORT) 160-4.5 MCG/ACT inhaler Inhale 2 puffs into the lungs 2 (two) times daily. 1 each 0   calcium carbonate (CALCIUM 600) 600 MG TABS tablet Take 1 tablet (600 mg total) by mouth 2 (two) times daily with a meal. 180 tablet 3   carvedilol (COREG) 6.25 MG tablet Take 1 tablet (6.25 mg total) by mouth 2 (two) times daily. 180 tablet 3   Continuous Blood Gluc Sensor (FREESTYLE LIBRE 14 DAY SENSOR) MISC See admin instructions.     dexlansoprazole (DEXILANT) 60 MG capsule Take 1 capsule (60 mg total) by mouth daily. 90 capsule 3   diclofenac Sodium (VOLTAREN) 1 % GEL Apply 2 g topically daily as needed. Use daily as needed for pain. Diclofenac tablet on hold. 100 g 0   DULoxetine (CYMBALTA) 60 MG capsule Take 1 capsule (60 mg total) by mouth 2 (two) times daily. 180 capsule 0   ferrous gluconate (FERGON) 324 MG tablet Take 1 tablet by mouth once daily with breakfast 90 tablet 0   FIASP FLEXTOUCH 100 UNIT/ML FlexTouch Pen Inject 32 Units into the skin with breakfast, with lunch, and with evening meal. 90 mL 2   furosemide (LASIX) 80 MG tablet Take 1.5 tablets (120 mg total) by mouth daily. 45 tablet 4   HYDROcodone-acetaminophen (NORCO) 10-325 MG tablet Take 1 tablet by mouth 5 (five) times daily as needed. 150 tablet 0   Insulin Pen Needle (PEN NEEDLES) 32G X 6 MM MISC Use as instructed to inject insulin in the morning, at noon, in the evening, and at bedtime 400 each 3   metFORMIN (GLUCOPHAGE-XR) 750 MG 24 hr tablet Take 1 tablet (750 mg total) by mouth 2 (two) times daily. 180 tablet 3   Multiple Vitamins-Minerals (EMERGEN-C IMMUNE PLUS) PACK Take 1 tablet by mouth 2 (two) times daily. 10 each 1   nortriptyline (PAMELOR) 50 MG capsule Take 1 capsule (50 mg total) by mouth at bedtime. 90 capsule 2   potassium chloride (KLOR-CON) 10 MEQ tablet Take 1 tablet (10 mEq total) by mouth 2 (two) times daily.  180 tablet 2   rosuvastatin (CRESTOR) 20 MG tablet Take 1 tablet (20 mg total) by mouth daily. 90 tablet 3   [name] MAX SOLOSTAR 300 UNIT/ML Solostar Pen INJECT 100 UNITS SUBCUTANEOUSLY ONCE DAILY 30 mL 0   benzonatate (TESSALON) 200 MG capsule Take 1 capsule (200 mg total) by mouth 3 (three) times daily as needed for cough. 30 capsule 0   methocarbamol (ROBAXIN) 500 MG tablet TAKE 1 TABLET BY MOUTH TWICE DAILY AS NEEDED FOR MUSCLE SPASM 60 tablet 2   methylPREDNISolone (MEDROL DOSEPAK) 4 MG TBPK tablet Use as directed 21 tablet 0   No facility-administered medications prior to visit.     Allergies:   Ozempic (0.25 or  0.5 mg-dose) [semaglutide(0.25 or 0.5mg -dos)] and Tramadol   Social History   Socioeconomic History   Marital status: Divorced    Spouse name: Not on file   Number of children: 0   Years of education: Not on file   Highest education level: Not on file  Occupational History   Occupation: TEACHER ASSISTANT  Tobacco Use   Smoking status: Never   Smokeless tobacco: Never  Vaping Use   Vaping Use: Never used  Substance and Sexual Activity   Alcohol use: No   Drug use: No   Sexual activity: Yes  Other Topics Concern   Not on file  Social History Narrative   Not on file   Social Determinants of Health   Financial Resource Strain: Not on file  Food Insecurity: Not on file  Transportation Needs: Not on file  Physical Activity: Not on file  Stress: Not on file  Social Connections: Not on file     Family History:  The patient's family history includes Alzheimer's disease in her mother; Breast cancer (age of onset: 66) in her sister; Diabetes in her father; Hypertension in her mother.   ROS:   Please see the history of present illness.    ROS All other systems reviewed and are negative.   PHYSICAL EXAM:   VS:  BP 136/65 (BP Location: Left Arm, Patient Position: Sitting, Cuff Size: Large)   Pulse 96   Ht 5\' 3"  (1.6 m)   Wt 259 lb (117.5 kg)   LMP 02/09/2017    SpO2 97%   BMI 45.88 kg/m     General: Alert, oriented x3, no distress, morbidly obese Head: no evidence of trauma, PERRL, EOMI, no exophtalmos or lid lag, no myxedema, no xanthelasma; normal ears, nose and oropharynx Neck: normal jugular venous pulsations and no hepatojugular reflux; brisk carotid pulses without delay and no carotid bruits Chest: clear to auscultation, no signs of consolidation by percussion or palpation, normal fremitus, symmetrical and full respiratory excursions Cardiovascular: normal position and quality of the apical impulse, regular rhythm, normal first and second heart sounds, no murmurs, rubs or gallops Abdomen: no tenderness or distention, no masses by palpation, no abnormal pulsatility or arterial bruits, normal bowel sounds, no hepatosplenomegaly Extremities: no clubbing, cyanosis or edema; 2+ radial, ulnar and brachial pulses bilaterally; 2+ right femoral, posterior tibial and dorsalis pedis pulses; 2+ left femoral, posterior tibial and dorsalis pedis pulses; no subclavian or femoral bruits Neurological: grossly nonfocal Psych: Normal mood and affect    Wt Readings from Last 3 Encounters:  02/01/21 261 lb (118.4 kg)  01/31/21 259 lb (117.5 kg)  12/28/20 266 lb 6.4 oz (120.8 kg)      Studies/Labs Reviewed:   Echocardiogram 06/09/2019     1. Left ventricular ejection fraction, by estimation, is 60 to 65%. The  left ventricle has normal function. The left ventricle has no regional  wall motion abnormalities. There is mild left ventricular hypertrophy.  Left ventricular diastolic parameters  are consistent with Grade I diastolic dysfunction (impaired relaxation).   2. Right ventricular systolic function is normal. The right ventricular  size is normal.   3. The mitral valve is grossly normal. Trivial mitral valve  regurgitation.   4. The aortic valve was not well visualized. Aortic valve regurgitation  is not visualized.   5. The inferior vena cava  is normal in size with greater than 50%  respiratory variability, suggesting right atrial pressure of 3 mmHg.   EKG:  EKG is not ordered  today.  Her most recent ECG from 09/22/2020 was personally reviewed shows normal sinus rhythm, delayed R wave progression V1-V3, QTC 433 ms  Recent Labs: 04/12/2020: Hemoglobin 13.4; Platelets 374 05/09/2020: ALT 14 10/05/2020: TSH 0.21 11/07/2020: BNP 7.7; BUN 16; Creatinine, Ser 1.56; Potassium 4.6; Sodium 140   Lipid Panel    Component Value Date/Time   CHOL 180 02/29/2020 1138   TRIG 133 02/29/2020 1138   HDL 34 (L) 02/29/2020 1138   CHOLHDL 5.3 (H) 02/29/2020 1138   CHOLHDL 6.9 06/09/2019 0339   VLDL 52 (H) 06/09/2019 0339   LDLCALC 122 (H) 02/29/2020 1138   LDLDIRECT 127.0 12/07/2014 1534    ASSESSMENT:    1. Chronic diastolic heart failure ([place])   2. History of cardiac radiofrequency ablation (RFA)   3. History of syncope   4. Type 2 diabetes mellitus with hyperglycemia, with long-term current use of insulin ([facility])   5. Hyperthyroidism   6. Hyperlipidemia associated with type 2 diabetes mellitus ([place])      PLAN:  In order of problems listed above:  CHF: Improved.  We will set her "dry weight" at 260 pounds.  Okay to increase furosemide 220 mg if her weight exceeds 260 pounds.  Continue to follow a sodium restricted diet and avoid frequent use of NSAIDs.  I think she would really benefit from the use of SGLT2 inhibitors ([place] or [place]).    Palpitations: History of AVNRT with successful RF ablation without arrhythmia recurrence.  Now has occasional isolated palpitations only.  Probably PACs or PVCs. Syncope: Remote syncopal events without recurrent arrhythmia during 3 years of loop recorder monitoring.  Suspect vasovagal syncope Stroke: Similarly no evidence of atrial fibrillation during 3 years of implantable loop recorder monitor DM: Markedly improved control.  Seen her endocrinologist Dr. Kelton Pillar for again in just a few days.   Encourage discussion of Wilder Glade or Vania Rea since this would also help with diastolic heart failure. HLP: Lipid profile to be performed next month at her follow-up visit with her PCP, [name].  Target LDL cholesterol less than 100, preferably less than 70.   Hyperthyroidism: TSH 0.210 in July of this year.   Patient Instructions  Medication Instructions:  No changes *If you need a refill on your cardiac medications before your next appointment, please call your pharmacy*   Lab Work: None ordered If you have labs (blood work) drawn today and your tests are completely normal, you will receive your results only by: [place] (if you have MyChart) OR A paper copy in the mail If you have any lab test that is abnormal or we need to change your treatment, we will call you to review the results.   Testing/Procedures: None ordered   Follow-Up: At [facility], you and your health needs are our priority.  As part of our continuing mission to provide you with exceptional heart care, we have created designated Provider Care Teams.  These Care Teams include your primary Cardiologist (physician) and Advanced Practice Providers (APPs -  Physician Assistants and Nurse Practitioners) who all work together to provide you with the care you need, when you need it.  We recommend signing up for the patient portal called "MyChart".  Sign up information is provided on this After Visit Summary.  MyChart is used to connect with patients for Virtual Visits (Telemedicine).  Patients are able to view lab/test results, encounter notes, upcoming appointments, etc.  Non-urgent messages can be sent to your provider as well.   To learn  more about what you can do with MyChart, go to [email or web address].    Your next appointment:   12 month(s)  The format for your next appointment:   In Person  Provider:   Sanda Klein, MD     Other Instructions Low-Sodium Eating Plan Sodium, which is an  element that makes up salt, helps you maintain a healthy balance of fluids in your body. Too much sodium can increase your blood pressure and cause fluid and waste to be held in your body. Your health care provider or dietitian may recommend following this plan if you have high blood pressure (hypertension), kidney disease, liver disease, or heart failure. Eating less sodium can help lower your blood pressure, reduce swelling, and protect your heart, liver, and kidneys. What are tips for following this plan? Reading food labels The Nutrition Facts label lists the amount of sodium in one serving of the food. If you eat more than one serving, you must multiply the listed amount of sodium by the number of servings. Choose foods with less than 140 mg of sodium per serving. Avoid foods with 300 mg of sodium or more per serving. Shopping  Look for lower-sodium products, often labeled as "low-sodium" or "no salt added." Always check the sodium content, even if foods are labeled as "unsalted" or "no salt added." Buy fresh foods. Avoid canned foods and pre-made or frozen meals. Avoid canned, cured, or processed meats. Buy breads that have less than 80 mg of sodium per slice. Cooking  Eat more home-cooked food and less restaurant, buffet, and fast food. Avoid adding salt when cooking. Use salt-free seasonings or herbs instead of table salt or sea salt. Check with your health care provider or pharmacist before using salt substitutes. Cook with plant-based oils, such as canola, sunflower, or olive oil. Meal planning When eating at a restaurant, ask that your food be prepared with less salt or no salt, if possible. Avoid dishes labeled as brined, pickled, cured, smoked, or made with soy sauce, miso, or teriyaki sauce. Avoid foods that contain MSG (monosodium glutamate). MSG is sometimes added to [place] food, bouillon, and some canned foods. Make meals that can be grilled, baked, poached, roasted, or  steamed. These are generally made with less sodium. General information Most people on this plan should limit their sodium intake to 1,500-2,000 mg (milligrams) of sodium each day. What foods should I eat? Fruits Fresh, frozen, or canned fruit. Fruit juice. Vegetables Fresh or frozen vegetables. "No salt added" canned vegetables. "No salt added" tomato sauce and paste. Low-sodium or reduced-sodium tomato and vegetable juice. Grains Low-sodium cereals, including oats, puffed wheat and rice, and shredded wheat. Low-sodium crackers. Unsalted rice. Unsalted pasta. Low-sodium bread. Whole-grain breads and whole-grain pasta. Meats and other proteins Fresh or frozen (no salt added) meat, poultry, seafood, and fish. Low-sodium canned tuna and salmon. Unsalted nuts. Dried peas, beans, and lentils without added salt. Unsalted canned beans. Eggs. Unsalted nut butters. Dairy Milk. Soy milk. Cheese that is naturally low in sodium, such as ricotta cheese, fresh mozzarella, or Swiss cheese. Low-sodium or reduced-sodium cheese. Cream cheese. Yogurt. Seasonings and condiments Fresh and dried herbs and spices. Salt-free seasonings. Low-sodium mustard and ketchup. Sodium-free salad dressing. Sodium-free light mayonnaise. Fresh or refrigerated horseradish. Lemon juice. Vinegar. Other foods Homemade, reduced-sodium, or low-sodium soups. Unsalted popcorn and pretzels. Low-salt or salt-free chips. The items listed above may not be a complete list of foods and beverages you can eat. Contact a dietitian for more information. What foods  should I avoid? Vegetables Sauerkraut, pickled vegetables, and relishes. Olives. [place] fries. Onion rings. Regular canned vegetables (not low-sodium or reduced-sodium). Regular canned tomato sauce and paste (not low-sodium or reduced-sodium). Regular tomato and vegetable juice (not low-sodium or reduced-sodium). Frozen vegetables in sauces. Grains Instant hot cereals. Bread stuffing,  pancake, and biscuit mixes. Croutons. Seasoned rice or pasta mixes. Noodle soup cups. Boxed or frozen macaroni and cheese. Regular salted crackers. Self-rising flour. Meats and other proteins Meat or fish that is salted, canned, smoked, spiced, or pickled. Precooked or cured meat, such as sausages or meat loaves. [place] Salines. Ham. Pepperoni. Hot dogs. Corned beef. Chipped beef. Salt pork. Jerky. Pickled herring. Anchovies and sardines. Regular canned tuna. Salted nuts. Dairy Processed cheese and cheese spreads. Hard cheeses. Cheese curds. Blue cheese. Feta cheese. String cheese. Regular cottage cheese. Buttermilk. Canned milk. Fats and oils Salted butter. Regular margarine. Ghee. Bacon fat. Seasonings and condiments Onion salt, garlic salt, seasoned salt, table salt, and sea salt. Canned and packaged gravies. Worcestershire sauce. Tartar sauce. Barbecue sauce. Teriyaki sauce. Soy sauce, including reduced-sodium. Steak sauce. Fish sauce. Oyster sauce. Cocktail sauce. Horseradish that you find on the shelf. Regular ketchup and mustard. Meat flavorings and tenderizers. Bouillon cubes. Hot sauce. Pre-made or packaged marinades. Pre-made or packaged taco seasonings. Relishes. Regular salad dressings. Salsa. Other foods Salted popcorn and pretzels. Corn chips and puffs. Potato and tortilla chips. Canned or dried soups. Pizza. Frozen entrees and pot pies. The items listed above may not be a complete list of foods and beverages you should avoid. Contact a dietitian for more information. Summary Eating less sodium can help lower your blood pressure, reduce swelling, and protect your heart, liver, and kidneys. Most people on this plan should limit their sodium intake to 1,500-2,000 mg (milligrams) of sodium each day. Canned, boxed, and frozen foods are high in sodium. Restaurant foods, fast foods, and pizza are also very high in sodium. You also get sodium by adding salt to food. Try to cook at home, eat more fresh  fruits and vegetables, and eat less fast food and canned, processed, or prepared foods. This information is not intended to replace advice given to you by your health care provider. Make sure you discuss any questions you have with your health care provider. Document Revised: 04/03/2019 Document Reviewed: 01/28/2019 Elsevier Patient Education  2022 [place], Sanda Klein, MD  02/04/2021 4:43 PM    [facility] [address], [place], [place]  [address] Phone: [phone]; Fax: [phone]

## 2021-02-01 ENCOUNTER — Encounter: Payer: Self-pay | Admitting: Adult Health

## 2021-02-01 ENCOUNTER — Ambulatory Visit (INDEPENDENT_AMBULATORY_CARE_PROVIDER_SITE_OTHER): Payer: BC Managed Care – PPO | Admitting: Adult Health

## 2021-02-01 VITALS — BP 153/81 | HR 99 | Ht 63.0 in | Wt 261.0 lb

## 2021-02-01 DIAGNOSIS — I69398 Other sequelae of cerebral infarction: Secondary | ICD-10-CM

## 2021-02-01 DIAGNOSIS — R252 Cramp and spasm: Secondary | ICD-10-CM | POA: Diagnosis not present

## 2021-02-01 DIAGNOSIS — H8110 Benign paroxysmal vertigo, unspecified ear: Secondary | ICD-10-CM | POA: Diagnosis not present

## 2021-02-01 DIAGNOSIS — I639 Cerebral infarction, unspecified: Secondary | ICD-10-CM

## 2021-02-01 NOTE — Progress Notes (Signed)
[facility] [address] [place]. [place] [address] [phone]       STROKE FOLLOW UP NOTE  [name] Date of Birth:  Mar 28, 1960 Medical Record Number:  [id number]   Reason for Referral: stroke follow up    SUBJECTIVE:   CHIEF COMPLAINT:  Chief Complaint  Patient presents with   Follow-up    Rm 3 alone states her hand wont close completely and has been having some vertigo     HPI:   Update 02/01/2021 JM: Returns for overdue 37-month stroke follow-up unaccompanied  Reports over the past 3-4 weeks she has been experiencing increased tightness of right hand where she has difficulty making a fist.  She denies any worsening weakness.  She has been experiencing some upper arm and upper leg pain on the right side.  Also is scheduled to undergo medial nerve block for chronic low back pain in January with PMR.  Hx of BPPV  - reports occasional vertigo worse with increased fatigue and when laying down in bed - she is interested in doing vestibular rehab again.   Denies new stroke/TIA symptoms  Compliant on aspirin and Crestor -denies side effects Blood pressure today 153/81 Glucose levels improving with prior A1c 9.2 (09/2020) down from 13.2 (06/2020)  No further concerns at this time    History provided for reference purposes only Update 03/24/2020 JM: [name] returns for a prolonged 2-month follow-up after prior visit approximately 8 months ago.  Doing well from a stroke standpoint without new or worsening stroke/TIA symptoms with improvement of right-sided numbness and balance.  She is ambulating without assistive device and has returned back to work without difficulty. She does report left-sided top of head quick 1-2 second electrical shock type zaps that occur on occasion which has been present since her stroke. Denies worsening and not associated with any other symptoms. Remains on aspirin 81 mg daily without bleeding or bruising.  Recent  lipid panel showed elevated LDL therefore switch from atorvastatin to Crestor which she has tolerated without side effects.  Blood pressure today 124/78. Recent A1c elevated and is in the process of switching endocrinologist and reports some improvement of glucose levels. No concerns at this time.  Initial visit 07/20/2019 JM: [name] is being seen for hospital follow-up.  Residual deficits imbalance with dizziness, and RUE numbness with decreased dexterity.  Continues to work with PT with ongoing improvement as well as Epley maneuver for likely BPPV and plans on starting OT soon. Use of rollator walker for long distance - no AD for short distance. Mild short term memory impairment and delayed recall post stroke.  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  PCP initially recommended returning part-time but unfortunately her current employer will not honor restriction.  Completed 3 weeks DAPT and continues on aspirin alone without bleeding or bruising.  Continues on atorvastatin 40 mg daily without myalgias.  Blood pressure today 142/83. Monitors at home and has been stable. Glucose levels have improved - continues to follow with endocrinology. Prior evaluation for sleep apnea which was negative.  Loop recorder has not shown atrial fibrillation thus far.  No further concerns at this time.  Stroke admission 06/08/2019 [name] is a 60 y.o. female with history of HTN, DB, OSA, SVT, depression ands chronic pain  who presented on 06/08/2019 with R sided sensory numbness and paresthesias along with vertigo and an unsteady sensation x 3 days.  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  Recommended DAPT for 3 weeks then aspirin alone as previously not on antithrombotic.  History of HTN stable.  History of HLD with LDL 131 and increase atorvastatin dose from 20 mg to 40 mg daily.  History of DM uncontrolled  with A1c 11.4.  Other stroke risk factors include diffuse intracranial arthrosclerosis and stenosis, and morbid obesity but no prior stroke history.  Evaluated by therapy who recommended outpatient therapy and discharged home in stable condition.  Stroke:   L paramedial pontine infarct secondary to small vessel disease   Code Stroke CT head No acute abnormality.  MRI  Acute L paramedian pontine infarct. 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  MRA  No antegrade flow R VA. Retrograde flow to R PICA. Poor R PCA flow d/t d/t narrow and irregular PCom and severe dz P1 from basilar tip. CTA head & neck distal R V2 stenosis. Proximal R PCom moderate stenosis. L ICA bifurcation atherosclerosis. Diffuse goiter. B cervical ICA tortuosity. Dome L VA origin atherosclerosis. Mild distal small vessel disease. 2D Echo EF 60-65%. No source of embolus  Has loop recorder placed 3 yrs ago by Dr Olevia Perches - no AF   LDL 131 HgbA1c 11.4 Heparin 5000 units sq tid for VTE prophylaxis No antithrombotic prior to admission, now on aspirin 81 mg daily. Add plavix. Continue DAPT x 3 weeks then aspirin alone. Added to d/c orders and AVS Therapy recommendations:  OP PT Disposition:  Return home       ROS:   14 system review of systems performed and negative with exception of those listed in HPI  PMH:  Past Medical History:  Diagnosis Date   Abscess    increased drainage from abscess on buttock   Anal fistula    Anxiety    Bilateral hip pain 05/27/2015   Chronic pain syndrome 05/27/2015   Depression    sees [name]   Diabetes mellitus without complication ([place])    Diverticulosis    Elevated alkaline phosphatase level    Fatty liver    Gallstones    Hiatal hernia    Hyperlipidemia    Hypertension    Internal hemorrhoids    Sleep apnea    2008- sleep study, neg. for sleep apnea    Stroke [facility])    SVT (supraventricular tachycardia) ([place])    Symptomatic cholelithiasis 09/11/2018    PSH:  Past  Surgical History:  Procedure Laterality Date   ANAL EXAMINATION UNDER ANESTHESIA  02/21/11   anal fistula   BREAST SURGERY  patient does not remember date of procedure   pull fluid off lft br   CHOLECYSTECTOMY N/A 09/12/2018   Procedure: LAPAROSCOPIC CHOLECYSTECTOMY;  Surgeon: Stark Klein, MD;  Location: [place];  Service: General;  Laterality: N/A;   ELECTROPHYSIOLOGIC STUDY N/A 05/05/2015   Procedure: SVT Ablation;  Surgeon: Will Meredith Leeds, MD;  Location: [place] [facility];  Service: Cardiovascular;  Laterality: N/A;   EP IMPLANTABLE DEVICE N/A 01/30/2016   Procedure: Loop Recorder Insertion;  Surgeon: Sanda Klein, MD;  Location: [place] [facility];  Service: Cardiovascular;  Laterality: N/A;   INCISE AND DRAIN ABCESS     abscess on right thigh and buttock   KNEE ARTHROSCOPY     left   LAPAROSCOPIC APPENDECTOMY N/A 09/19/2018   Procedure: APPENDECTOMY LAPAROSCOPIC;  Surgeon: Clovis Riley, MD;  Location: [place];  Service: General;  Laterality: N/A;   SHOULDER SURGERY  04/14/09   right    Social History:  Social History   Socioeconomic History   Marital status: Divorced    Spouse name: Not on file   Number of children: 0   Years of education: Not on file   Highest education level: Not on file  Occupational History   Occupation: TEACHER ASSISTANT  Tobacco Use   Smoking status: Never   Smokeless tobacco: Never  Vaping Use   Vaping Use: Never used  Substance and Sexual Activity   Alcohol use: No   Drug use: No   Sexual activity: Yes  Other Topics Concern   Not on file  Social History Narrative   Not on file   Social Determinants of Health   Financial Resource Strain: Not on file  Food Insecurity: Not on file  Transportation Needs: Not on file  Physical Activity: Not on file  Stress: Not on file  Social Connections: Not on file  Intimate Partner Violence: Not on file    Family History:  Family History  Problem Relation Age of Onset   Hypertension Mother     Alzheimer's disease Mother    Diabetes Father    Breast cancer Sister 75   Anesthesia problems Neg Hx    Hypotension Neg Hx    Malignant hyperthermia Neg Hx    Pseudochol deficiency Neg Hx    Colon cancer Neg Hx    Esophageal cancer Neg Hx    Stomach cancer Neg Hx    Rectal cancer Neg Hx    Thyroid disease Neg Hx     Medications:   Current Outpatient Medications on File Prior to Visit  Medication Sig Dispense Refill   aspirin 81 MG EC tablet Take 1 tablet (81 mg total) by mouth daily. 90 tablet 3   benzonatate (TESSALON) 200 MG capsule Take 1 capsule (200 mg total) by mouth 3 (three) times daily as needed for cough. 30 capsule 0   budesonide-formoterol (SYMBICORT) 160-4.5 MCG/ACT inhaler Inhale 2 puffs into the lungs 2 (two) times daily. 1 each 0   calcium carbonate (CALCIUM 600) 600 MG TABS tablet Take 1 tablet (600 mg total) by mouth 2 (two) times daily with a meal. 180 tablet 3   carvedilol (COREG) 6.25 MG tablet Take 1 tablet (6.25 mg total) by mouth 2 (two) times daily. 180 tablet 3   Continuous Blood Gluc Sensor (FREESTYLE LIBRE 14 DAY SENSOR) MISC See admin instructions.     dexlansoprazole (DEXILANT) 60 MG capsule Take 1 capsule (60 mg total) by mouth daily. 90 capsule 3   diclofenac Sodium (VOLTAREN) 1 % GEL Apply 2 g topically daily as needed. Use daily as needed for pain. Diclofenac tablet on hold. 100 g 0   DULoxetine (CYMBALTA) 60 MG capsule Take 1 capsule (60 mg total) by mouth 2 (two) times daily. 180 capsule 0   ferrous gluconate (FERGON) 324 MG tablet Take 1 tablet by mouth once daily with breakfast 90 tablet 0   FIASP FLEXTOUCH 100 UNIT/ML FlexTouch Pen Inject 32 Units into the skin with breakfast, with lunch, and with evening meal. 90 mL 2   furosemide (LASIX) 80 MG tablet Take 1.5 tablets (120 mg total) by mouth daily. 45 tablet 4   HYDROcodone-acetaminophen (NORCO) 10-325 MG tablet Take 1 tablet by mouth 5 (five) times daily as needed. 150 tablet 0   Insulin Pen  Needle (PEN NEEDLES) 32G X 6 MM MISC Use as instructed to inject insulin in the morning, at noon, in the evening,  and at bedtime 400 each 3   metFORMIN (GLUCOPHAGE-XR) 750 MG 24 hr tablet Take 1 tablet (750 mg total) by mouth 2 (two) times daily. 180 tablet 3   methocarbamol (ROBAXIN) 500 MG tablet TAKE 1 TABLET BY MOUTH TWICE DAILY AS NEEDED FOR MUSCLE SPASM 60 tablet 2   methylPREDNISolone (MEDROL DOSEPAK) 4 MG TBPK tablet Use as directed 21 tablet 0   Multiple Vitamins-Minerals (EMERGEN-C IMMUNE PLUS) PACK Take 1 tablet by mouth 2 (two) times daily. 10 each 1   nortriptyline (PAMELOR) 50 MG capsule Take 1 capsule (50 mg total) by mouth at bedtime. 90 capsule 2   potassium chloride (KLOR-CON) 10 MEQ tablet Take 1 tablet (10 mEq total) by mouth 2 (two) times daily. 180 tablet 2   rosuvastatin (CRESTOR) 20 MG tablet Take 1 tablet (20 mg total) by mouth daily. 90 tablet 3   [name] MAX SOLOSTAR 300 UNIT/ML Solostar Pen INJECT 100 UNITS SUBCUTANEOUSLY ONCE DAILY 30 mL 0   No current facility-administered medications on file prior to visit.    Allergies:   Allergies  Allergen Reactions   Ozempic (0.25 Or 0.5 Mg-Dose) [Semaglutide(0.25 Or 0.5mg -Dos)]     Severe nausea, bad gas   Tramadol Nausea Only      OBJECTIVE:  Physical Exam  Vitals:   02/01/21 1538  BP: (!) 153/81  Pulse: 99  Weight: 261 lb (118.4 kg)  Height: 5\' 3"  (1.6 m)   Body mass index is 46.23 kg/m. No results found.  General: Morbidly obese very pleasant middle-age female, seated, in no evident distress Head: head normocephalic and atraumatic.   Neck: supple with no carotid or supraclavicular bruits Cardiovascular: regular rate and rhythm, no murmurs Musculoskeletal: no deformity Skin:  no rash/petichiae Vascular:  Normal pulses all extremities   Neurologic Exam Mental Status: Awake and fully alert.  Fluent speech and language. Oriented to place and time. Recent memory subjectively impaired and remote memory  intact. Attention span, concentration and fund of knowledge appropriate during visit. Mood and affect appropriate.  Cranial Nerves: Pupils equal, briskly reactive to light. Extraocular movements full without nystagmus. Visual fields full to confrontation. Hearing intact. Facial sensation intact. Face, tongue, palate moves normally and symmetrically.  Motor: Normal bulk and tone. Normal strength in all tested extremity muscles except slightly decreased right hand dexterity and decreased grip strength with increased tone (unable to make complete fist) Sensory.: Intact light touch, vibratory and pinprick sensation throughout all tested extremities.  Coordination: Rapid alternating movements normal in all extremities except slightly decreased right hand. Finger-to-nose and heel-to-shin performed accurately bilaterally.  Gait and Station: Arises from chair without difficulty. Stance is normal. Gait demonstrates normal stride length and balance without use of assistive device.  Romberg negative. Reflexes: 1+ and symmetric. Toes downgoing.        ASSESSMENT: [name] is a 60 y.o. 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 Vascular risk factors include BPPV, HTN, HLD, uncontrolled DM, diffuse intracranial arthrosclerosis and stenosis and morbid obesity.      PLAN:  Left paramedial pontine stroke:  Residual deficits: RUE decreased hand dexterity with increased tone. Referral placed to [facility] OT. No evidence of new or worsening weakness.  Continue aspirin 81 mg daily  and Crestor 20 mg daily for secondary stroke prevention.  Discussed secondary stroke prevention measures and importance of close PCP follow-up  for aggressive stroke risk factor management including HTN with BP goal<130/90, HLD with LDL  goal<70 and DM with A1c goal<7.0  BPPV: chronic hx. previously worked with PT 07/2019.  Referral placed to [facility] PT for additional vestibular therapy    Follow up in 6 months or call earlier if needed   CC:  Tysinger, Camelia Eng, PA-C    I spent 31 minutes of face-to-face and non-face-to-face time with patient.  This included previsit chart review, lab review, study review, order entry, electronic health record documentation, patient education regarding prior stroke, residual deficits, BPPV and vestibular therapy, importance of managing stroke risk factors and secondary stroke prevention measures and answered all other questions to patient satisfaction  Frann Rider, [facility]  [facility] [address] [place], [place] [address]  Phone [phone] Fax [phone] Note: This document was prepared with digital dictation and possible smart phrase technology. Any transcriptional errors that result from this process are unintentional.

## 2021-02-01 NOTE — Patient Instructions (Signed)
Call neuro rehab early next week to schedule initial evaluations for both vestibular therapy (veritigo) and for right hand increased tone (or spasticity)   Continue aspirin 81 mg daily  and Crestor  for secondary stroke prevention  Continue to follow up with PCP regarding cholesterol, blood pressure and diabetes management  Maintain strict control of hypertension with blood pressure goal below 130/90, diabetes with hemoglobin A1c goal below 7.0% and cholesterol with LDL cholesterol (bad cholesterol) goal below 70 mg/dL.   Continue to follow with physical medicine rehab for pain management      Followup in the future with me in 6 months or call earlier if needed       Thank you for coming to see [place] at [facility]. I hope we have been able to provide you high quality care today.  You may receive a patient satisfaction survey over the next few weeks. We would appreciate your feedback and comments so that we may continue to improve ourselves and the health of our patients.

## 2021-02-04 ENCOUNTER — Encounter: Payer: Self-pay | Admitting: Cardiovascular Disease

## 2021-02-06 ENCOUNTER — Other Ambulatory Visit (HOSPITAL_COMMUNITY): Payer: Self-pay | Admitting: Physical Medicine & Rehabilitation

## 2021-02-06 MED ORDER — NORTRIPTYLINE HCL 75 MG PO CAPS
75.0000 mg | ORAL_CAPSULE | Freq: Every day | ORAL | 2 refills | Status: DC
Start: 2021-02-06 — End: 2021-06-26

## 2021-02-07 ENCOUNTER — Telehealth: Payer: Self-pay

## 2021-02-07 NOTE — Telephone Encounter (Signed)
Pt called statin the pharmacy was only able to give her 105 pills for her RX , her original prescription is for QTY of 150 her pharmacy is needing a new prescription of Qty of 45 pills of hydrocodone for her medication can be complete per pmp last filled 01/29/21 . She usually sees [place] but since Zella Ball is on vacation im forwarding this message to MD. Verified by PMP Pt only got 105 and pt should get 150.

## 2021-02-08 ENCOUNTER — Other Ambulatory Visit: Payer: Self-pay

## 2021-02-08 ENCOUNTER — Ambulatory Visit (INDEPENDENT_AMBULATORY_CARE_PROVIDER_SITE_OTHER): Payer: BC Managed Care – PPO | Admitting: Internal Medicine

## 2021-02-08 ENCOUNTER — Encounter: Payer: Self-pay | Admitting: Internal Medicine

## 2021-02-08 VITALS — BP 120/74 | HR 74 | Ht 63.0 in | Wt 264.0 lb

## 2021-02-08 DIAGNOSIS — E1142 Type 2 diabetes mellitus with diabetic polyneuropathy: Secondary | ICD-10-CM

## 2021-02-08 DIAGNOSIS — E0590 Thyrotoxicosis, unspecified without thyrotoxic crisis or storm: Secondary | ICD-10-CM

## 2021-02-08 DIAGNOSIS — E1159 Type 2 diabetes mellitus with other circulatory complications: Secondary | ICD-10-CM

## 2021-02-08 DIAGNOSIS — E1165 Type 2 diabetes mellitus with hyperglycemia: Secondary | ICD-10-CM

## 2021-02-08 DIAGNOSIS — Z794 Long term (current) use of insulin: Secondary | ICD-10-CM | POA: Diagnosis not present

## 2021-02-08 DIAGNOSIS — E042 Nontoxic multinodular goiter: Secondary | ICD-10-CM | POA: Diagnosis not present

## 2021-02-08 DIAGNOSIS — E785 Hyperlipidemia, unspecified: Secondary | ICD-10-CM

## 2021-02-08 LAB — POCT GLYCOSYLATED HEMOGLOBIN (HGB A1C): Hemoglobin A1C: 9.6 % — AB (ref 4.0–5.6)

## 2021-02-08 LAB — GLUCOSE, POCT (MANUAL RESULT ENTRY): POC Glucose: 405 mg/dl — AB (ref 70–99)

## 2021-02-08 MED ORDER — HYDROCODONE-ACETAMINOPHEN 10-325 MG PO TABS: 1.0000 | ORAL_TABLET | Freq: Every day | ORAL | 0 refills | Status: DC | PRN

## 2021-02-08 NOTE — Patient Instructions (Addendum)
-   Continue  Metformin 750 mg , 1 tablet Twice daily  - Continue [name]  92 units once daily  -  Fiasp 36 units with Breakfast, and 32 units with Lunch and Supper   -Fiasp correctional insulin: ADD extra units on insulin to your meal-time Fiasp dose if your blood sugars are higher than 150. Use the scale below to help guide you:   Blood sugar before meal Number of units to inject  Less than 150 0 unit  151 -  170 1 units  171 -  190 2 units  191 -  210 3 units  211 -  230 4 units  231 -  250 5 units  251 -  270 6 units  271 -  290 7 units  291 -  310 8 units  311 - 330 9 units  331 - 350 10 units  351 - 370 11 units   371 - 390 12 units  391 - 410 13 units         HOW TO TREAT LOW BLOOD SUGARS (Blood sugar LESS THAN 70 MG/DL) Please follow the RULE OF 15 for the treatment of hypoglycemia treatment (when your (blood sugars are less than 70 mg/dL)   STEP 1: Take 15 grams of carbohydrates when your blood sugar is low, which includes:  3-4 GLUCOSE TABS  OR 3-4 OZ OF JUICE OR REGULAR SODA OR ONE TUBE OF GLUCOSE GEL    STEP 2: RECHECK blood sugar in 15 MINUTES STEP 3: If your blood sugar is still low at the 15 minute recheck --> then, go back to STEP 1 and treat AGAIN with another 15 grams of carbohydrates.

## 2021-02-08 NOTE — Telephone Encounter (Signed)
done

## 2021-02-08 NOTE — Progress Notes (Signed)
Name: [name]  Age/ Sex: 60 y.o., female   MRN/ DOB: [id number], [birth year]     PCP: Carlena Hurl, PA-C   Reason for Endocrinology Evaluation: Type 2 Diabetes Mellitus  Initial Endocrine Consultative Visit: 07/06/2020    PATIENT IDENTIFIER: [name] is a 60 y.o. female with a past medical history of SVT, MNG, T2DM and Dyslipidemia . The patient has followed with Endocrinology clinic since 07/06/2020 for consultative assistance with management of her diabetes.  DIABETIC HISTORY:  Ms. Leija was diagnosed with DM in 2011 ,Ozempic- severe nausea . Insulin started in 2015. Metformin- no intolerance , jardiance- no intolerance, soliqua. Her hemoglobin A1c has ranged from 6.5%  in 2016, peaking at 13.6% in 2021  THYROID HISTROY : She was found to have hypermetabolic  MNG on PET scan in 2016 during evaluation of ovarian cyst. These results were confirmed with thyroid ultrasound in 2017. She is S/P FNA of the isthmic and right lobe nodule with scant cellularity in 2017. She was lost to follow up until 06/2020, repeat ultrasound showed heterogenous gland but no specific nodules  Reduced Metformin due to low GFR 01/2021  Aldo: renin normal 09/2020   SUBJECTIVE:   During the last visit (10/05/2020): A1c 9.2 % We continued metformin and adjusted MDI regimen     Today (02/08/2021): [name]  She checks her blood sugars BID  . She stopped using the CGM due to cost . Did not bring her meter. The patient has  not had hypoglycemic episodes since the last clinic visit.  Denies nausea , vomiting or diarrhea  Has occasional local neck swelling  Denies palpitations, or tremors    She had corn , barbeque chicken and did not Fiasp with Lunch      HOME DIABETES REGIMEN:  Metformin 750 mg BID  [name] 90 mg daily - takes 92 units  Fiasp 36 units with Breakfast and 32 with Lunch and Supper - takes 32 units TID     Statin: yes ACE-I/ARB: no Prior Diabetic  Education: yes    CONTINUOUS GLUCOSE MONITORING RECORD INTERPRETATION : not using      DIABETIC COMPLICATIONS: Microvascular complications:  Neuropathy Denies: CKD, retinopathy Last Eye Exam: Completed 09/2020  Macrovascular complications:  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   Diabetes mellitus without complication ([place])    Diverticulosis    Elevated alkaline phosphatase level    Fatty liver    Gallstones    Hiatal hernia    Hyperlipidemia    Hypertension    Internal hemorrhoids    Sleep apnea    2008- sleep study, neg. for sleep apnea    Stroke ([place])    SVT (supraventricular tachycardia) ([place])    Symptomatic cholelithiasis 09/11/2018   Past Surgical History:  Past Surgical History:  Procedure Laterality Date   ANAL EXAMINATION UNDER ANESTHESIA  02/21/11   anal fistula   BREAST SURGERY  patient does not remember date of procedure   pull fluid off lft br   CHOLECYSTECTOMY N/A 09/12/2018   Procedure: LAPAROSCOPIC CHOLECYSTECTOMY;  Surgeon: Stark Klein, MD;  Location: [place];  Service: General;  Laterality: N/A;   ELECTROPHYSIOLOGIC  STUDY N/A 05/05/2015   Procedure: SVT Ablation;  Surgeon: Will Meredith Leeds, MD;  Location: [place] [facility];  Service: Cardiovascular;  Laterality: N/A;   EP IMPLANTABLE DEVICE N/A 01/30/2016   Procedure: Loop Recorder Insertion;  Surgeon: Sanda Klein, MD;  Location: [place] [facility];  Service: Cardiovascular;  Laterality: N/A;   INCISE AND DRAIN ABCESS     abscess on right thigh and buttock   KNEE ARTHROSCOPY     left   LAPAROSCOPIC APPENDECTOMY N/A 09/19/2018   Procedure: APPENDECTOMY LAPAROSCOPIC;  Surgeon: Clovis Riley, MD;  Location: [place] [facility];  Service:  General;  Laterality: N/A;   SHOULDER SURGERY  04/14/09   right   Social History:  reports that she has never smoked. She has never used smokeless tobacco. She reports that she does not drink alcohol and does not use drugs. Family History:  Family History  Problem Relation Age of Onset   Hypertension Mother    Alzheimer's disease Mother    Diabetes Father    Breast cancer Sister 55   Anesthesia problems Neg Hx    Hypotension Neg Hx    Malignant hyperthermia Neg Hx    Pseudochol deficiency Neg Hx    Colon cancer Neg Hx    Esophageal cancer Neg Hx    Stomach cancer Neg Hx    Rectal cancer Neg Hx    Thyroid disease Neg Hx      HOME MEDICATIONS: Allergies as of 02/08/2021       Reactions   Ozempic (0.25 Or 0.5 Mg-dose) [semaglutide(0.25 Or 0.5mg -dos)]    Severe nausea, bad gas   Tramadol Nausea Only        Medication List        Accurate as of February 08, 2021  3:30 PM. If you have any questions, ask your nurse or doctor.          aspirin 81 MG EC tablet Take 1 tablet (81 mg total) by mouth daily.   benzonatate 200 MG capsule Commonly known as: TESSALON Take 1 capsule (200 mg total) by mouth 3 (three) times daily as needed for cough.   budesonide-formoterol 160-4.5 MCG/ACT inhaler Commonly known as: Symbicort Inhale 2 puffs into the lungs 2 (two) times daily.   calcium carbonate 600 MG Tabs tablet Commonly known as: Calcium 600 Take 1 tablet (600 mg total) by mouth 2 (two) times daily with a meal.   carvedilol 6.25 MG tablet Commonly known as: COREG Take 1 tablet (6.25 mg total) by mouth 2 (two) times daily.   Dexilant 60 MG capsule Generic drug: dexlansoprazole Take 1 capsule (60 mg total) by mouth daily.   diclofenac Sodium 1 % Gel Commonly known as: VOLTAREN Apply 2 g topically daily as needed. Use daily as needed for pain. Diclofenac tablet on hold.   DULoxetine 60 MG capsule Commonly known as: CYMBALTA Take 1 capsule (60 mg total) by mouth 2  (two) times daily.   Emergen-C Immune Plus Pack Take 1 tablet by mouth 2 (two) times daily.   ferrous gluconate 324 MG tablet Commonly known as: FERGON Take 1 tablet by mouth once daily with breakfast   Fiasp FlexTouch 100 UNIT/ML FlexTouch Pen Generic drug: insulin aspart Inject 32 Units into the skin with breakfast, with lunch, and with evening meal.   FreeStyle Libre 14 Day Sensor Misc See admin instructions.   furosemide 80 MG tablet Commonly known as: LASIX Take 1.5 tablets (120 mg total) by mouth daily.   HYDROcodone-acetaminophen 10-325  MG tablet Commonly known as: NORCO Take 1 tablet by mouth 5 (five) times daily as needed.   metFORMIN 750 MG 24 hr tablet Commonly known as: GLUCOPHAGE-XR Take 1 tablet (750 mg total) by mouth 2 (two) times daily.   methocarbamol 500 MG tablet Commonly known as: ROBAXIN TAKE 1 TABLET BY MOUTH TWICE DAILY AS NEEDED FOR MUSCLE SPASM   nortriptyline 75 MG capsule Commonly known as: Pamelor Take 1 capsule (75 mg total) by mouth at bedtime.   Pen Needles 32G X 6 MM Misc Use as instructed to inject insulin in the morning, at noon, in the evening, and at bedtime   potassium chloride 10 MEQ tablet Commonly known as: KLOR-CON Take 1 tablet (10 mEq total) by mouth 2 (two) times daily.   rosuvastatin 20 MG tablet Commonly known as: Crestor Take 1 tablet (20 mg total) by mouth daily.   [name] Max SoloStar 300 UNIT/ML Solostar Pen Generic drug: insulin glargine (2 Unit Dial) INJECT 100 UNITS SUBCUTANEOUSLY ONCE DAILY         OBJECTIVE:   Vital Signs: BP 120/74 (BP Location: Left Arm, Patient Position: Sitting, Cuff Size: Small)   Pulse 74   Ht 5\' 3"  (1.6 m)   Wt 264 lb (119.7 kg)   LMP 02/09/2017   SpO2 99%   BMI 46.77 kg/m   Wt Readings from Last 3 Encounters:  02/08/21 264 lb (119.7 kg)  02/01/21 261 lb (118.4 kg)  01/31/21 259 lb (117.5 kg)     Exam: General: Pt appears well and is in NAD Thyromegaly noted    Lungs: Clear with good BS bilat with no rales, rhonchi, or wheezes  Heart: RRR   Extremities: Trace pretibial edema.   Neuro: MS is good with appropriate affect, pt is alert and Ox3      DM foot exam: 07/06/2020   The skin of the feet is  without sores or ulcerations. The pedal pulses are 1+ on right and 1+ on left. The sensation is decreased at the heels to a screening 5.07, 10 gram monofilament bilaterally     DATA REVIEWED:  Lab Results  Component Value Date   HGBA1C 9.2 (A) 10/05/2020   HGBA1C 13.2 (A) 07/06/2020   HGBA1C 10.9 (H) 02/29/2020    Latest Reference Range & Units 02/08/21 15:59  Sodium 135 - 145 mEq/L 137  Potassium 3.5 - 5.1 mEq/L 4.4  Chloride 96 - 112 mEq/L 99  CO2 19 - 32 mEq/L 30  Glucose 70 - 99 mg/dL 329 (H)  BUN 6 - 23 mg/dL 19  Creatinine 0.40 - 1.20 mg/dL 1.39 (H)  Calcium 8.4 - 10.5 mg/dL 10.2  GFR >60.00 mL/min 41.33 (L)  Total CHOL/HDL Ratio  5  Cholesterol 0 - 200 mg/dL 156  HDL Cholesterol >39.00 mg/dL 34.20 (L)  Direct LDL mg/dL 89.0  MICROALB/CREAT RATIO 0.0 - 30.0 mg/g 1.2  Triglycerides 0.0 - 149.0 mg/dL 424.0 Triglyceride is over 400; calculations on Lipids are invalid. 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 No discrete nodules are identifiable separate from the background thyroid parenchyma on today's examination. Previously biopsied nodules now blend in with the background thyroid parenchyma.   IMPRESSION: Slight interval enlargement of the overall thyroid gland which is diffusely heterogeneous consistent with diffuse goitrous replacement.   Previously biopsied nodules are now no longer evident and have blended in with the background thyroid parenchyma. No discrete nodules  or new suspicious findings identified.   FNA isthmic nodule 1.8 cm  03/31/2015 Scant epithelium     FNA right lower nodule 4.8 cm 03/31/2015 Scant  epithelium   ASSESSMENT / PLAN / RECOMMENDATIONS:   1) Type 2 Diabetes Mellitus, poorly controlled, With Neuropathic and macrovascular complications - Most recent A1c of 9.6%. Goal A1c <7.0%.      Pt continues with hyperglycemia due to medication non adherence and dietary indiscretions  It appeas she only takes her basal insulin because for the past 2 visits, she has not taken prandial before her office visit  GFR today < 45, will reduce Metformin as below  She was encouraged to check glucose, she was provided with sample freestyle libre until she is able to have co-pay  Pt with social determinants  She will be provided with a correction scale  Discussed risk of hyperglycemia on worsening CKD and other microvascular complications   MEDICATIONS: Decrease metformin 750 mg , 1 tablet  daily Continue [name]  92 units once daily Increase Fiasp to 36 units with Breakfast and 32 units with Lunch and Supper CF: Fiasp ( BG-130/20)  EDUCATION / INSTRUCTIONS: BG monitoring instructions: Patient is instructed to check her blood sugars 3 times a day, before meals. Call [facility] if: BG persistently < 70  I reviewed the Rule of 15 for the treatment of hypoglycemia in detail with the patient. Literature supplied.   2) Diabetic complications:  Eye: Does not have known diabetic retinopathy.  Neuro/ Feet: Does  have known diabetic peripheral neuropathy .  Renal: Patient does not have known baseline CKD. She   is  on an ACEI/ARB at present.    3) MNG:    -No local neck symptoms - She is S/P FNA of the isthmic and right nodule in 2017 with scan cellularity , she was lost to follow up until 07/2020 when a repeat Thyroid ultrasound showed increase thyroid heterogeneity but no actual thyroid nodules, previously biopsied nodules have blended in with a heterogeneous background - Will repeat again in 07/2021 to confirm         4) Subclinical hyperthyroidism:   - Pt is clinically  euthyroid - Most likely due to [place], but graves' disease is another differential , TRAb negative, I have ordered Uptake and scan in 09/2020 but this has not been done yet  - She has no plans of doing this at this time and would like to postpone until after the holidays,we initially discussed proceeding with treatment but TFT's normal today and no treatment warranted  - Discussed risk of cardiac arrhythmia and increased bone resorption with hyperthyroidism     5) Dyslipidemia :   - Tg very high, increased risk for pancreatitis - Will start fenofibrate and continue Rosuvastatin   Medication  Start Fenofibrate 145 mg daily  Rosuvastatin 20 mg daily      F/U in 4 months   Signed electronically by: Mack Guise, MD  [facility]  [facility] [address], [place] [address] Phone: [phone] FAX: [phone]   CC: Carlena Hurl, PA-C [place] [address] Phone: [phone]  Fax: [phone]  Return to Endocrinology clinic as below: Future Appointments  Date Time Provider [place]  02/08/2021  3:40 PM Ndea Kilroy, Melanie Crazier, MD [facility] None  02/22/2021  3:20 PM Bayard Hugger, NP CPR[facility]PR[facility] CPR  03/17/2021  2:30 PM Kirsteins, Luanna Salk, MD CPR[facility]PR[facility] CPR  04/03/2021  2:30 PM  Caryl Ada PFM-PFM [place]  08/03/2021  3:15 PM Frann Rider, NP [facility] None

## 2021-02-09 ENCOUNTER — Ambulatory Visit: Payer: BC Managed Care – PPO | Admitting: Medical

## 2021-02-09 LAB — LIPID PANEL
Cholesterol: 156 mg/dL (ref 0–200)
HDL: 34.2 mg/dL — ABNORMAL LOW (ref >39.00)
Total CHOL/HDL Ratio: 5
Triglycerides: 424 mg/dL — ABNORMAL HIGH (ref 0.0–149.0)

## 2021-02-09 LAB — BASIC METABOLIC PANEL
BUN: 19 mg/dL (ref 6–23)
CO2: 30 mEq/L (ref 19–32)
Calcium: 10.2 mg/dL (ref 8.4–10.5)
Chloride: 99 mEq/L (ref 96–112)
Creatinine, Ser: 1.39 mg/dL — ABNORMAL HIGH (ref 0.40–1.20)
GFR: 41.33 mL/min — ABNORMAL LOW (ref >60.00)
Glucose, Bld: 329 mg/dL — ABNORMAL HIGH (ref 70–99)
Potassium: 4.4 mEq/L (ref 3.5–5.1)
Sodium: 137 mEq/L (ref 135–145)

## 2021-02-09 LAB — MICROALBUMIN / CREATININE URINE RATIO
Creatinine,U: 64.4 mg/dL
Microalb Creat Ratio: 1.2 mg/g (ref 0.0–30.0)
Microalb, Ur: 0.7 mg/dL (ref 0.0–1.9)

## 2021-02-09 LAB — TSH: TSH: 0.46 u[IU]/mL (ref 0.35–5.50)

## 2021-02-09 LAB — T4, FREE: Free T4: 0.7 ng/dL (ref 0.60–1.60)

## 2021-02-09 LAB — LDL CHOLESTEROL, DIRECT: Direct LDL: 89 mg/dL

## 2021-02-10 ENCOUNTER — Encounter: Payer: Self-pay | Admitting: Internal Medicine

## 2021-02-10 DIAGNOSIS — E785 Hyperlipidemia, unspecified: Secondary | ICD-10-CM | POA: Insufficient documentation

## 2021-02-10 MED ORDER — METFORMIN HCL ER 750 MG PO TB24: 750.0000 mg | ORAL_TABLET | Freq: Every day | ORAL | 3 refills | Status: DC

## 2021-02-10 MED ORDER — FENOFIBRATE 145 MG PO TABS: 145.0000 mg | ORAL_TABLET | Freq: Every day | ORAL | 3 refills | Status: DC

## 2021-02-20 ENCOUNTER — Telehealth: Payer: Self-pay | Admitting: Registered Nurse

## 2021-02-20 MED ORDER — HYDROCODONE-ACETAMINOPHEN 10-325 MG PO TABS: 1.0000 | ORAL_TABLET | Freq: Every day | ORAL | 0 refills | Status: DC | PRN

## 2021-02-20 NOTE — Telephone Encounter (Signed)
PMP was Reviewed, Hydrocodone prescription was removed from [facility] and sent to a new [employer] today. Placed a call to [name] regarding the above, she verbalizes understanding.

## 2021-02-21 ENCOUNTER — Telehealth: Payer: Self-pay | Admitting: Registered Nurse

## 2021-02-21 ENCOUNTER — Telehealth: Payer: Self-pay

## 2021-02-21 NOTE — Telephone Encounter (Signed)
Spoke with [facility] with [employer] and she stated there is a quantity limit of 180 and she cannot get her full prescription until 02/23/21. I informed her that the patient needs her medication and is starting to go through withdrawal. She stated a quantity of 75 can be sent in to meet the quantity required and she will still be able to get her full prescription on 12/15. I asked if a 5-7 day supply can be sent in and she said it could be but it may push the date back for it to be filled. On goodrx.com, 10 tablets for 2 days is 5.86 at [facility]. Please advise

## 2021-02-21 NOTE — Telephone Encounter (Signed)
PMP was Reviewed.  [name] spoke with her insurance company.  This provider spoke with the pharmacist and asked if they would run the prescription for 45 tablets. Insurance approved the 45 tablets.  [name] will send a MY-Chart message to [name] regarding the above.

## 2021-02-22 ENCOUNTER — Encounter: Payer: BC Managed Care – PPO | Attending: Physical Medicine & Rehabilitation | Admitting: Registered Nurse

## 2021-02-22 ENCOUNTER — Other Ambulatory Visit: Payer: Self-pay

## 2021-02-22 VITALS — BP 117/70 | HR 84 | Ht 63.0 in | Wt 265.0 lb

## 2021-02-22 DIAGNOSIS — Z79891 Long term (current) use of opiate analgesic: Secondary | ICD-10-CM | POA: Diagnosis not present

## 2021-02-22 DIAGNOSIS — G8929 Other chronic pain: Secondary | ICD-10-CM | POA: Diagnosis present

## 2021-02-22 DIAGNOSIS — M79605 Pain in left leg: Secondary | ICD-10-CM

## 2021-02-22 DIAGNOSIS — Z5181 Encounter for therapeutic drug level monitoring: Secondary | ICD-10-CM | POA: Diagnosis not present

## 2021-02-22 DIAGNOSIS — G894 Chronic pain syndrome: Secondary | ICD-10-CM

## 2021-02-22 DIAGNOSIS — M79604 Pain in right leg: Secondary | ICD-10-CM | POA: Diagnosis not present

## 2021-02-22 MED ORDER — HYDROCODONE-ACETAMINOPHEN 10-325 MG PO TABS: 1.0000 | ORAL_TABLET | Freq: Every day | ORAL | 0 refills | Status: DC | PRN

## 2021-02-22 NOTE — Progress Notes (Signed)
Subjective:    Patient ID: [name], female    DOB: 08-30-1960, 60 y.o.   MRN: [id number]  HPI: [name] is a 60 y.o. female who returns for follow up appointment for chronic pain and medication refill. She states her pain is located in her lower back radiating into her bilateral hips L>R. She also reports increase intensity of lower extremity pin especially in her calves. We will order a venous duplex, she agrees and verbalizes understanding. She rates her pain 7. Her current exercise regime is walking and performing stretching exercises.  [name] Morphine equivalent is 50.00 MME.   Oral Swab was ordered today.     Pain Inventory Average Pain 8 Pain Right Now 7 My pain is burning, dull, tingling, and aching  In the last 24 hours, has pain interfered with the following? General activity 10 Relation with others 10 Enjoyment of life 9 What TIME of day is your pain at its worst? daytime and night Sleep (in general) Poor  Pain is worse with: walking, standing, and some activites Pain improves with: medication Relief from Meds: 3  Family History  Problem Relation Age of Onset   Hypertension Mother    Alzheimer's disease Mother    Diabetes Father    Breast cancer Sister 32   Anesthesia problems Neg Hx    Hypotension Neg Hx    Malignant hyperthermia Neg Hx    Pseudochol deficiency Neg Hx    Colon cancer Neg Hx    Esophageal cancer Neg Hx    Stomach cancer Neg Hx    Rectal cancer Neg Hx    Thyroid disease Neg Hx    Social History   Socioeconomic History   Marital status: Divorced    Spouse name: Not on file   Number of children: 0   Years of education: Not on file   Highest education level: Not on file  Occupational History   Occupation: TEACHER ASSISTANT  Tobacco Use   Smoking status: Never   Smokeless tobacco: Never  Vaping Use   Vaping Use: Never used  Substance and Sexual Activity   Alcohol use: No   Drug use: No   Sexual activity:  Yes  Other Topics Concern   Not on file  Social History Narrative   Not on file   Social Determinants of Health   Financial Resource Strain: Not on file  Food Insecurity: Not on file  Transportation Needs: Not on file  Physical Activity: Not on file  Stress: Not on file  Social Connections: Not on file   Past Surgical History:  Procedure Laterality Date   ANAL EXAMINATION UNDER ANESTHESIA  02/21/11   anal fistula   BREAST SURGERY  patient does not remember date of procedure   pull fluid off lft br   CHOLECYSTECTOMY N/A 09/12/2018   Procedure: LAPAROSCOPIC CHOLECYSTECTOMY;  Surgeon: Stark Klein, MD;  Location: [name];  Service: General;  Laterality: N/A;   ELECTROPHYSIOLOGIC STUDY N/A 05/05/2015   Procedure: SVT Ablation;  Surgeon: Will Meredith Leeds, MD;  Location: [place] [facility];  Service: Cardiovascular;  Laterality: N/A;   EP IMPLANTABLE DEVICE N/A 01/30/2016   Procedure: Loop Recorder Insertion;  Surgeon: Sanda Klein, MD;  Location: [place] [facility];  Service: Cardiovascular;  Laterality: N/A;   INCISE AND DRAIN ABCESS     abscess on right thigh and buttock   KNEE ARTHROSCOPY     left   LAPAROSCOPIC APPENDECTOMY N/A 09/19/2018   Procedure: APPENDECTOMY LAPAROSCOPIC;  Surgeon: Clovis Riley, MD;  [facility];  Service: General;  Laterality: N/A;   SHOULDER SURGERY  04/14/09   right   Past Surgical History:  Procedure Laterality Date   ANAL EXAMINATION UNDER ANESTHESIA  02/21/11   anal fistula   BREAST SURGERY  patient does not remember date of procedure   pull fluid off lft br   CHOLECYSTECTOMY N/A 09/12/2018   Procedure: LAPAROSCOPIC CHOLECYSTECTOMY;  Surgeon: Stark Klein, MD;  Location: [place];  Service: General;  Laterality: N/A;   ELECTROPHYSIOLOGIC STUDY N/A 05/05/2015   Procedure: SVT Ablation;  Surgeon: Will Meredith Leeds, MD;  Location: [place] [facility];  Service: Cardiovascular;  Laterality: N/A;   EP IMPLANTABLE DEVICE N/A 01/30/2016    Procedure: Loop Recorder Insertion;  Surgeon: Sanda Klein, MD;  Location: [place] [facility];  Service: Cardiovascular;  Laterality: N/A;   INCISE AND DRAIN ABCESS     abscess on right thigh and buttock   KNEE ARTHROSCOPY     left   LAPAROSCOPIC APPENDECTOMY N/A 09/19/2018   Procedure: APPENDECTOMY LAPAROSCOPIC;  Surgeon: Clovis Riley, MD;  Location: [place];  Service: General;  Laterality: N/A;   SHOULDER SURGERY  04/14/09   right   Past Medical History:  Diagnosis Date   Abscess    increased drainage from abscess on buttock   Anal fistula    Anxiety    Bilateral hip pain 05/27/2015   Chronic pain syndrome 05/27/2015   Depression    sees [name]   Diabetes mellitus without complication [facility])    Diverticulosis    Elevated alkaline phosphatase level    Fatty liver    Gallstones    Hiatal hernia    Hyperlipidemia    Hypertension    Internal hemorrhoids    Sleep apnea    2008- sleep study, neg. for sleep apnea    Stroke [facility])    SVT (supraventricular tachycardia) ([facility])    Symptomatic cholelithiasis 09/11/2018   BP 117/70    Pulse 84    Ht 5\' 3"  (1.6 m)    Wt 265 lb (120.2 kg)    LMP 02/09/2017    SpO2 92%    BMI 46.94 kg/m   Opioid Risk Score:   Fall Risk Score:  `1  Depression screen PHQ 2/9  Depression screen [facility] 2/9 12/28/2020 11/21/2020 09/02/2020 06/23/2020 02/29/2020 09/03/2019 07/06/2019  Decreased Interest 0 0 0 0 0 0 1  Down, Depressed, Hopeless 0 0 0 0 0 0 1  PHQ - 2 Score 0 0 0 0 0 0 2  Altered sleeping - - - - - 1 1  Tired, decreased energy - - - - - 1 2  Change in appetite - - - - - 0 0  Feeling bad or failure about yourself  - - - - - 0 0  Trouble concentrating - - - - - 0 1  Moving slowly or fidgety/restless - - - - - 0 0  Suicidal thoughts - - - - - 0 0  PHQ-9 Score - - - - - 2 6  Difficult doing work/chores - - - - - - Not difficult at all  Some recent data might be hidden     Review of Systems  Musculoskeletal:  Positive for back pain.  All  other systems reviewed and are negative.     Objective:   Physical Exam Vitals and nursing note reviewed.  Constitutional:      Appearance: Normal appearance.  Cardiovascular:  Rate and Rhythm: Normal rate and regular rhythm.     Pulses: Normal pulses.     Heart sounds: Normal heart sounds.  Pulmonary:     Effort: Pulmonary effort is normal.     Breath sounds: Normal breath sounds.  Musculoskeletal:     Cervical back: Normal range of motion and neck supple.     Comments: Normal Muscle Bulk and Muscle Testing Reveals:  Upper Extremities: Full ROM and Muscle Strength 5/5  Lumbar Paraspinal Tenderness: L-3-L-5 Greater Trochanter Tenderness: L>R Lower Extremities: Full ROM and Muscle Strength 5/5 Arises from Table Slowly Antalgic  Gait     Skin:    General: Skin is warm and dry.  Neurological:     Mental Status: She is alert and oriented to person, place, and time.  Psychiatric:        Mood and Affect: Mood normal.        Behavior: Behavior normal.         Assessment & Plan:  1.Chronic Bilateral Hip pain L>R---endstage OA of hips..Continue HEP as tolerated and Heat and Ice Therapy. Refilled:  Hydrocodone 10/325 one tablet 5 times a day as needed for moderate pain #150. 03/01/2021 We will continue the opioid monitoring program, this consists of regular clinic visits, examinations, urine drug screen, pill counts as well as use of [place] Controlled Substance Reporting system. A 12 month History has been reviewed on the [place] Controlled Substance Reporting System on 03/01/2021 2. Lumbar Radiculitis:  Continue medication regimen with Pamelor. Continue Cymbalta and Continue  HEP as Tolerated.  Continue to Monitor. 03/01/2021.          -              3. Morbid obesity: Continue with healthy diet Regime and HEP. Encouraged to continue with healthy diet regime and HEP. 03/01/2021 4.Bilateral  Greater Trochanteric Bursitis:.Continue with Heat and Ice Therapy. Continue  to Monitor. 03/01/2021. 5. Left Knee Pain: No complaints Today.Continue with HEP as Tolerated. Continue to Monitor. 03/01/2021 6. Muscle Spasm: Continue  Methocarbamol. Continue to monitor. 12/21 /2022.   7. Bilateral Lower Extremities Pain: RX: Venous Duplex   F/U in 2 months

## 2021-02-23 ENCOUNTER — Telehealth: Payer: Self-pay | Admitting: Registered Nurse

## 2021-02-23 NOTE — Telephone Encounter (Signed)
I have left a message for patient that we have her Venous Doppler scheduled at [facility] tomorrow 02/24/21 to arrive about 1:30, she will need to go to main entrance and go to registration any questions please call Zella Ball.

## 2021-02-24 ENCOUNTER — Ambulatory Visit (HOSPITAL_COMMUNITY): Payer: BC Managed Care – PPO

## 2021-02-27 ENCOUNTER — Telehealth: Payer: Self-pay

## 2021-02-27 ENCOUNTER — Ambulatory Visit (HOSPITAL_COMMUNITY)
Admission: RE | Admit: 2021-02-27 | Discharge: 2021-02-27 | Disposition: A | Discharge status: Home / Self Care | Payer: BC Managed Care – PPO | Source: Ambulatory Visit | Attending: Registered Nurse | Admitting: Registered Nurse

## 2021-02-27 ENCOUNTER — Other Ambulatory Visit: Payer: Self-pay

## 2021-02-27 DIAGNOSIS — M79605 Pain in left leg: Secondary | ICD-10-CM

## 2021-02-27 DIAGNOSIS — M79604 Pain in right leg: Secondary | ICD-10-CM

## 2021-02-27 LAB — DRUG TOX MONITOR 1 W/CONF, ORAL FLD
Amphetamines: NEGATIVE ng/mL (ref <10)
Barbiturates: NEGATIVE ng/mL (ref <10)
Benzodiazepines: NEGATIVE ng/mL (ref <0.50)
Buprenorphine: NEGATIVE ng/mL (ref <0.10)
Cocaine: NEGATIVE ng/mL (ref <5.0)
Codeine: NEGATIVE ng/mL (ref <2.5)
Dihydrocodeine: 20.3 ng/mL — ABNORMAL HIGH (ref <2.5)
Fentanyl: NEGATIVE ng/mL (ref <0.10)
Heroin Metabolite: NEGATIVE ng/mL (ref <1.0)
Hydrocodone: 183.5 ng/mL — ABNORMAL HIGH (ref <2.5)
Hydromorphone: NEGATIVE ng/mL (ref <2.5)
MARIJUANA: NEGATIVE ng/mL (ref <2.5)
MDMA: NEGATIVE ng/mL (ref <10)
Meprobamate: NEGATIVE ng/mL (ref <2.5)
Methadone: NEGATIVE ng/mL (ref <5.0)
Morphine: NEGATIVE ng/mL (ref <2.5)
Nicotine Metabolite: NEGATIVE ng/mL (ref <5.0)
Norhydrocodone: 5.5 ng/mL — ABNORMAL HIGH (ref <2.5)
Noroxycodone: NEGATIVE ng/mL (ref <2.5)
Opiates: POSITIVE ng/mL — AB (ref <2.5)
Oxycodone: NEGATIVE ng/mL (ref <2.5)
Oxymorphone: NEGATIVE ng/mL (ref <2.5)
Phencyclidine: NEGATIVE ng/mL (ref <10)
Tapentadol: NEGATIVE ng/mL (ref <5.0)
Tramadol: NEGATIVE ng/mL (ref <5.0)
Zolpidem: NEGATIVE ng/mL (ref <5.0)

## 2021-02-27 LAB — DRUG TOX ALC METAB W/CON, ORAL FLD: Alcohol Metabolite: NEGATIVE ng/mL (ref <25)

## 2021-02-27 MED ORDER — HYDROCODONE-ACETAMINOPHEN 10-325 MG PO TABS: 1.0000 | ORAL_TABLET | Freq: Every day | ORAL | 0 refills | Status: DC | PRN

## 2021-02-27 NOTE — Telephone Encounter (Signed)
[facility] on [address] has the Hydrocodone for the patient. Prescription has been cancelled at [facility]

## 2021-02-27 NOTE — Telephone Encounter (Signed)
Called [facility] and spoke with the pharmacist. She stated that the patient received a Hydrocodone prescription on 02/21/21. I informed her that was a partial fill for a 9 day supply. She stated that insurance may not cover it until 03/02/21 due to the prescription on 02/21/21. She wanted [place] to inform the patient to try calling them on Thursday to see if they can run it through.

## 2021-02-27 NOTE — Addendum Note (Signed)
Addended by: Bayard Hugger on: 02/27/2021 11:18 AM   Modules accepted: Orders

## 2021-02-27 NOTE — Telephone Encounter (Signed)
PMP was Reviewed Hydrocodone e- scribed today Ms. Kretz is aware via [employer].

## 2021-03-01 ENCOUNTER — Encounter: Payer: Self-pay | Admitting: Registered Nurse

## 2021-03-07 ENCOUNTER — Telehealth: Payer: Self-pay | Admitting: *Deleted

## 2021-03-07 NOTE — Telephone Encounter (Signed)
Oral swab drug screen was consistent for prescribed medications.  ?

## 2021-03-17 ENCOUNTER — Encounter: Payer: Self-pay | Admitting: Physical Medicine & Rehabilitation

## 2021-03-17 ENCOUNTER — Other Ambulatory Visit: Payer: Self-pay

## 2021-03-17 ENCOUNTER — Encounter
Payer: BC Managed Care – PPO | Attending: Physical Medicine & Rehabilitation | Admitting: Physical Medicine & Rehabilitation

## 2021-03-17 VITALS — BP 168/83 | HR 93 | Ht 63.0 in | Wt 265.0 lb

## 2021-03-17 DIAGNOSIS — M47817 Spondylosis without myelopathy or radiculopathy, lumbosacral region: Secondary | ICD-10-CM | POA: Diagnosis not present

## 2021-03-17 NOTE — Progress Notes (Signed)
°  PROCEDURE RECORD [facility]   Name: [name] DOB:01/02/1961 MRN: [id number]  Date:03/17/2021  Physician: Alysia Penna, MD    Nurse/CMA: Ronnald Ramp, CMA  Allergies:  Allergies  Allergen Reactions   Ozempic (0.25 Or 0.5 Mg-Dose) [Semaglutide(0.25 Or 0.5mg -Dos)]     Severe nausea, bad gas   Tramadol Nausea Only    Consent Signed: Yes.    Is patient diabetic? Yes.    CBG today? 49  Pregnant: No. LMP: Patient's last menstrual period was 02/09/2017. (age 42-55)  Anticoagulants: no Anti-inflammatory: no Antibiotics: no  Procedure: Left Medial Branch Block  Position: Prone Start Time: 3:06  End Time: 3:12  Fluoro Time: 37  RN/CMA Shriyans Kuenzi, CMA Tanzie Rothschild, CMA    Time 2:49 3:21    BP 118/76 168/83    Pulse 83 93    Respirations 14 16    O2 Sat 98 98    S/S 6 6    Pain Level 7/10 4/10     D/C home with self, patient A & O X 3, D/C instructions reviewed, and sits independently.

## 2021-03-17 NOTE — Patient Instructions (Signed)

## 2021-03-17 NOTE — Progress Notes (Signed)
Left Lumbar L3, L4  medial branch blocks and L 5 dorsal ramus injection under fluoroscopic guidance   Indication: Left Lumbar pain which is not relieved by medication management or other conservative care and interfering with self-care and mobility.  Informed consent was obtained after describing risks and benefits of the procedure with the patient, this includes bleeding, bruising, infection, paralysis and medication side effects.  The patient wishes to proceed and has given written consent.  The patient was placed in a prone position.  The lumbar area was marked and prepped with Betadine.  One mL of 1% lidocaine was injected into each of 3 areas into the skin and subcutaneous tissue.  Then a 22-gauge 5in spinal needle was inserted targeting the junction of the left S1 superior articular process and sacral ala junction.  Needle was advanced under fluoroscopic guidance.  Bone contact was made. Isovue 200 was injected x 0.5 mL demonstrating no intravascular uptake.  Then a solution containing  2% MPF lidocaine was injected x 0.5 mL.  Then the left L5 superior articular process in transverse process junction was targeted.  Bone contact was made. Isovue 200 was injected x 0.5 mL demonstrating no intravascular uptake.  Then a solution containing 2% MPF lidocaine was injected x 0.5 mL.  Then the left L4 superior articular process in transverse process junction was targeted.  Bone contact was made.  Isovue 200 was injected x 0.5 mL demonstrating no intravascular uptake.  Then a solution containing  2% MPF lidocaine was injected x 0.5 mL.  Patient tolerated procedure well.  Post procedure instructions were given.   Pre injection 7/10 Post injection 3.5/10

## 2021-03-23 ENCOUNTER — Telehealth: Payer: Self-pay | Admitting: Registered Nurse

## 2021-03-23 ENCOUNTER — Other Ambulatory Visit (HOSPITAL_COMMUNITY): Payer: Self-pay

## 2021-03-23 MED ORDER — HYDROCODONE-ACETAMINOPHEN 10-325 MG PO TABS
1.0000 | ORAL_TABLET | Freq: Every day | ORAL | 0 refills | Status: DC | PRN
Start: 2021-03-23 — End: 2021-04-25
  Filled 2021-03-23 – 2021-03-28 (×3): qty 150, 30d supply, fill #0

## 2021-03-23 NOTE — Telephone Encounter (Signed)
PMP was Reviewed.  Hydrocodone e-scribed today.  [name] is aware via My-Chart Message.

## 2021-03-25 ENCOUNTER — Other Ambulatory Visit: Payer: Self-pay | Admitting: Medical

## 2021-03-25 DIAGNOSIS — Z87898 Personal history of other specified conditions: Secondary | ICD-10-CM

## 2021-03-25 DIAGNOSIS — I471 Supraventricular tachycardia: Secondary | ICD-10-CM

## 2021-03-27 ENCOUNTER — Other Ambulatory Visit: Payer: Self-pay | Admitting: Internal Medicine

## 2021-03-27 MED ORDER — ROSUVASTATIN CALCIUM 20 MG PO TABS
20.0000 mg | ORAL_TABLET | Freq: Every day | ORAL | 0 refills | Status: DC
Start: 2021-03-27 — End: 2021-06-26

## 2021-03-27 MED ORDER — FERROUS GLUCONATE 324 (38 FE) MG PO TABS: 324.0000 mg | ORAL_TABLET | Freq: Every day | ORAL | 0 refills | Status: DC

## 2021-03-27 MED ORDER — CARVEDILOL 6.25 MG PO TABS: 6.2500 mg | ORAL_TABLET | Freq: Two times a day (BID) | ORAL | 0 refills | Status: DC

## 2021-03-28 ENCOUNTER — Other Ambulatory Visit (HOSPITAL_COMMUNITY): Payer: Self-pay

## 2021-03-29 ENCOUNTER — Encounter: Payer: Self-pay | Admitting: Physical Medicine & Rehabilitation

## 2021-04-03 ENCOUNTER — Encounter: Payer: BC Managed Care – PPO | Admitting: Medical

## 2021-04-25 ENCOUNTER — Other Ambulatory Visit (HOSPITAL_COMMUNITY): Payer: Self-pay

## 2021-04-25 ENCOUNTER — Telehealth: Payer: Self-pay | Admitting: Registered Nurse

## 2021-04-25 MED ORDER — HYDROCODONE-ACETAMINOPHEN 10-325 MG PO TABS
1.0000 | ORAL_TABLET | Freq: Every day | ORAL | 0 refills | Status: DC | PRN
  Filled 2021-04-25: qty 150, 30d supply, fill #0

## 2021-04-25 NOTE — Telephone Encounter (Signed)
PMP was Reviewed.  Hydrocodone e-scribed today.  Ms. Cliburn is aware of the above via My- Chart.

## 2021-04-26 ENCOUNTER — Other Ambulatory Visit: Payer: Self-pay | Admitting: Internal Medicine

## 2021-04-26 DIAGNOSIS — Z87898 Personal history of other specified conditions: Secondary | ICD-10-CM

## 2021-04-26 DIAGNOSIS — I471 Supraventricular tachycardia: Secondary | ICD-10-CM

## 2021-04-26 MED ORDER — FUROSEMIDE 80 MG PO TABS: 80.0000 mg | ORAL_TABLET | Freq: Every day | ORAL | 0 refills | Status: DC

## 2021-05-05 ENCOUNTER — Encounter: Payer: BC Managed Care – PPO | Admitting: Physical Medicine & Rehabilitation

## 2021-05-05 ENCOUNTER — Encounter
Payer: BC Managed Care – PPO | Attending: Physical Medicine & Rehabilitation | Admitting: Physical Medicine & Rehabilitation

## 2021-05-05 ENCOUNTER — Encounter: Payer: Self-pay | Admitting: Physical Medicine & Rehabilitation

## 2021-05-05 ENCOUNTER — Other Ambulatory Visit: Payer: Self-pay

## 2021-05-05 VITALS — BP 146/77 | HR 90 | Temp 98.7°F | Ht 63.0 in | Wt 260.0 lb

## 2021-05-05 DIAGNOSIS — M47817 Spondylosis without myelopathy or radiculopathy, lumbosacral region: Secondary | ICD-10-CM | POA: Diagnosis not present

## 2021-05-05 NOTE — Progress Notes (Signed)
°  PROCEDURE RECORD [facility]   Name: [name] DOB:30-Nov-1960 MRN: [id number]  Date:05/05/2021  Physician: Alysia Penna, MD    Nurse/CMA: Ronnald Ramp, RMA   Allergies:  Allergies  Allergen Reactions   Ozempic (0.25 Or 0.5 Mg-Dose) [Semaglutide(0.25 Or 0.5mg -Dos)]     Severe nausea, bad gas   Tramadol Nausea Only    Consent Signed: Yes.    Is patient diabetic? Yes.    CBG today? 126  Pregnant: No. LMP: Patient's last menstrual period was 02/09/2017. (age 81-55)  Anticoagulants: no Anti-inflammatory: no Antibiotics: no  Procedure: Left L3-4-5 Medial Branch Block  Position: Prone Start Time: 3:39 pm  End Time: 3:44 pm  Fluoro Time: 31  RN/CMA Hayden Mabin, RMA Rolinda Impson, RMA    Time 3:28 pm 3:53 pm    BP 132/80 146/77    Pulse 82 92    Respirations 16 16    O2 Sat 98 98    S/S 6 6    Pain Level 7/10 3/10     D/C home with self, patient A & O X 3, D/C instructions reviewed, and sits independently.

## 2021-05-05 NOTE — Progress Notes (Signed)
Left Lumbar L3, L4  medial branch blocks and L 5 dorsal ramus injection under fluoroscopic guidance #2  Indication: Left Lumbar pain which is not relieved by medication management or other conservative care and interfering with self-care and mobility.  Informed consent was obtained after describing risks and benefits of the procedure with the patient, this includes bleeding, bruising, infection, paralysis and medication side effects.  The patient wishes to proceed and has given written consent.  The patient was placed in a prone position.  The lumbar area was marked and prepped with Betadine.  One mL of 1% lidocaine was injected into each of 3 areas into the skin and subcutaneous tissue.  Then a 22-gauge 5in spinal needle was inserted targeting the junction of the left S1 superior articular process and sacral ala junction.  Needle was advanced under fluoroscopic guidance.  Bone contact was made. Isovue 200 was injected x 0.5 mL demonstrating no intravascular uptake.  Then a solution containing  2% MPF lidocaine was injected x 0.5 mL.  Then the left L5 superior articular process in transverse process junction was targeted.  Bone contact was made. Isovue 200 was injected x 0.5 mL demonstrating no intravascular uptake.  Then a solution containing 2% MPF lidocaine was injected x 0.5 mL.  Then the left L4 superior articular process in transverse process junction was targeted.  Bone contact was made.  Isovue 200 was injected x 0.5 mL demonstrating no intravascular uptake.  Then a solution containing  2% MPF lidocaine was injected x 0.5 mL.  Patient tolerated procedure well.  Post procedure instructions were given.   Pre injection 7/10 Post injection 3/10  On 03/17/21 L3-4-5  Left MBB Pre inj 7/10 Post inj 3.5/10  Will schedule for L3-4-5 RF on Left

## 2021-05-05 NOTE — Patient Instructions (Signed)

## 2021-05-11 ENCOUNTER — Other Ambulatory Visit: Payer: Self-pay

## 2021-05-11 ENCOUNTER — Ambulatory Visit: Payer: BC Managed Care – PPO | Admitting: Medical

## 2021-05-11 VITALS — BP 110/70 | HR 86 | Ht 63.0 in | Wt 256.8 lb

## 2021-05-11 DIAGNOSIS — M160 Bilateral primary osteoarthritis of hip: Secondary | ICD-10-CM

## 2021-05-11 DIAGNOSIS — I5032 Chronic diastolic (congestive) heart failure: Secondary | ICD-10-CM

## 2021-05-11 DIAGNOSIS — E559 Vitamin D deficiency, unspecified: Secondary | ICD-10-CM

## 2021-05-11 DIAGNOSIS — E042 Nontoxic multinodular goiter: Secondary | ICD-10-CM

## 2021-05-11 DIAGNOSIS — E1142 Type 2 diabetes mellitus with diabetic polyneuropathy: Secondary | ICD-10-CM

## 2021-05-11 DIAGNOSIS — E2839 Other primary ovarian failure: Secondary | ICD-10-CM

## 2021-05-11 DIAGNOSIS — Z7185 Encounter for immunization safety counseling: Secondary | ICD-10-CM

## 2021-05-11 DIAGNOSIS — Z8673 Personal history of transient ischemic attack (TIA), and cerebral infarction without residual deficits: Secondary | ICD-10-CM

## 2021-05-11 DIAGNOSIS — R609 Edema, unspecified: Secondary | ICD-10-CM

## 2021-05-11 DIAGNOSIS — R12 Heartburn: Secondary | ICD-10-CM

## 2021-05-11 DIAGNOSIS — Z0000 Encounter for general adult medical examination without abnormal findings: Secondary | ICD-10-CM

## 2021-05-11 DIAGNOSIS — F4310 Post-traumatic stress disorder, unspecified: Secondary | ICD-10-CM

## 2021-05-11 DIAGNOSIS — G4700 Insomnia, unspecified: Secondary | ICD-10-CM

## 2021-05-11 DIAGNOSIS — N842 Polyp of vagina: Secondary | ICD-10-CM

## 2021-05-11 DIAGNOSIS — F411 Generalized anxiety disorder: Secondary | ICD-10-CM

## 2021-05-11 DIAGNOSIS — Z6841 Body Mass Index (BMI) 40.0 and over, adult: Secondary | ICD-10-CM

## 2021-05-11 DIAGNOSIS — E1165 Type 2 diabetes mellitus with hyperglycemia: Secondary | ICD-10-CM | POA: Diagnosis not present

## 2021-05-11 DIAGNOSIS — I10 Essential (primary) hypertension: Secondary | ICD-10-CM

## 2021-05-11 DIAGNOSIS — E790 Hyperuricemia without signs of inflammatory arthritis and tophaceous disease: Secondary | ICD-10-CM

## 2021-05-11 DIAGNOSIS — R269 Unspecified abnormalities of gait and mobility: Secondary | ICD-10-CM

## 2021-05-11 DIAGNOSIS — E785 Hyperlipidemia, unspecified: Secondary | ICD-10-CM | POA: Diagnosis not present

## 2021-05-11 DIAGNOSIS — E1169 Type 2 diabetes mellitus with other specified complication: Secondary | ICD-10-CM

## 2021-05-11 DIAGNOSIS — E119 Type 2 diabetes mellitus without complications: Secondary | ICD-10-CM

## 2021-05-11 DIAGNOSIS — Z780 Asymptomatic menopausal state: Secondary | ICD-10-CM

## 2021-05-11 DIAGNOSIS — Z91410 Personal history of adult physical and sexual abuse: Secondary | ICD-10-CM

## 2021-05-11 DIAGNOSIS — D329 Benign neoplasm of meninges, unspecified: Secondary | ICD-10-CM

## 2021-05-11 DIAGNOSIS — G894 Chronic pain syndrome: Secondary | ICD-10-CM

## 2021-05-11 DIAGNOSIS — R143 Flatulence: Secondary | ICD-10-CM

## 2021-05-11 DIAGNOSIS — K219 Gastro-esophageal reflux disease without esophagitis: Secondary | ICD-10-CM

## 2021-05-11 DIAGNOSIS — D219 Benign neoplasm of connective and other soft tissue, unspecified: Secondary | ICD-10-CM

## 2021-05-11 DIAGNOSIS — L732 Hidradenitis suppurativa: Secondary | ICD-10-CM

## 2021-05-11 DIAGNOSIS — Z794 Long term (current) use of insulin: Secondary | ICD-10-CM

## 2021-05-11 LAB — LIPID PANEL
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
## 2021-05-12 DIAGNOSIS — I5032 Chronic diastolic (congestive) heart failure: Secondary | ICD-10-CM | POA: Insufficient documentation

## 2021-05-12 DIAGNOSIS — N842 Polyp of vagina: Secondary | ICD-10-CM | POA: Insufficient documentation

## 2021-05-12 LAB — COMPREHENSIVE METABOLIC PANEL
ALT: 14 IU/L (ref 0–32)
AST: 14 IU/L (ref 0–40)
Albumin/Globulin Ratio: 1.8 (ref 1.2–2.2)
Albumin: 4 g/dL (ref 3.8–4.9)
Alkaline Phosphatase: 89 IU/L (ref 44–121)
BUN/Creatinine Ratio: 17 (ref 12–28)
BUN: 18 mg/dL (ref 8–27)
Bilirubin Total: 0.3 mg/dL (ref 0.0–1.2)
CO2: 24 mmol/L (ref 20–29)
Calcium: 10.3 mg/dL (ref 8.7–10.3)
Chloride: 101 mmol/L (ref 96–106)
Creatinine, Ser: 1.08 mg/dL — ABNORMAL HIGH (ref 0.57–1.00)
Globulin, Total: 2.2 g/dL (ref 1.5–4.5)
Glucose: 196 mg/dL — ABNORMAL HIGH (ref 70–99)
Potassium: 4.7 mmol/L (ref 3.5–5.2)
Sodium: 140 mmol/L (ref 134–144)
Total Protein: 6.2 g/dL (ref 6.0–8.5)
eGFR: 59 mL/min/{1.73_m2} — ABNORMAL LOW (ref >59)

## 2021-05-12 LAB — CBC
Hematocrit: 38.8 % (ref 34.0–46.6)
Hemoglobin: 12.3 g/dL (ref 11.1–15.9)
MCH: 25.4 pg — ABNORMAL LOW (ref 26.6–33.0)
MCHC: 31.7 g/dL (ref 31.5–35.7)
MCV: 80 fL (ref 79–97)
Platelets: 489 10*3/uL — ABNORMAL HIGH (ref 150–450)
RBC: 4.85 x10E6/uL (ref 3.77–5.28)
RDW: 13.3 % (ref 11.7–15.4)
WBC: 11.1 10*3/uL — ABNORMAL HIGH (ref 3.4–10.8)

## 2021-05-12 LAB — LIPID PANEL
Chol/HDL Ratio: 4.1 ratio (ref 0.0–4.4)
Cholesterol, Total: 147 mg/dL (ref 100–199)
HDL: 36 mg/dL — ABNORMAL LOW (ref >39)
LDL Chol Calc (NIH): 83 mg/dL (ref 0–99)
Triglycerides: 159 mg/dL — ABNORMAL HIGH (ref 0–149)
VLDL Cholesterol Cal: 28 mg/dL (ref 5–40)

## 2021-05-12 LAB — VITAMIN D 25 HYDROXY (VIT D DEFICIENCY, FRACTURES): Vit D, 25-Hydroxy: 39.6 ng/mL (ref 30.0–100.0)

## 2021-05-12 LAB — URIC ACID: Uric Acid: 5.5 mg/dL (ref 3.0–7.2)

## 2021-05-12 LAB — TSH+FREE T4
Free T4: 1.06 ng/dL (ref 0.82–1.77)
TSH: 0.51 u[IU]/mL (ref 0.450–4.500)

## 2021-05-12 LAB — HEMOGLOBIN A1C
Est. average glucose Bld gHb Est-mCnc: 260 mg/dL
Hgb A1c MFr Bld: 10.7 % — ABNORMAL HIGH (ref 4.8–5.6)

## 2021-05-12 LAB — IRON: Iron: 66 ug/dL (ref 27–159)

## 2021-05-12 MED ORDER — DULOXETINE HCL 60 MG PO CPEP: 60.0000 mg | ORAL_CAPSULE | Freq: Two times a day (BID) | ORAL | 0 refills | Status: DC

## 2021-05-12 MED ORDER — FERROUS GLUCONATE 324 (38 FE) MG PO TABS: 324.0000 mg | ORAL_TABLET | Freq: Every day | ORAL | 0 refills | Status: DC

## 2021-05-12 MED ORDER — BUDESONIDE-FORMOTEROL FUMARATE 160-4.5 MCG/ACT IN AERO: 2.0000 | INHALATION_SPRAY | Freq: Two times a day (BID) | RESPIRATORY_TRACT | 11 refills | Status: DC

## 2021-05-12 MED ORDER — ASPIRIN 81 MG PO TBEC: 81.0000 mg | DELAYED_RELEASE_TABLET | Freq: Every day | ORAL | 3 refills | Status: DC

## 2021-05-12 MED ORDER — SIMETHICONE 125 MG PO TABS: 1.0000 | ORAL_TABLET | Freq: Three times a day (TID) | ORAL | 2 refills | Status: DC

## 2021-05-22 ENCOUNTER — Other Ambulatory Visit (HOSPITAL_COMMUNITY): Payer: Self-pay

## 2021-05-22 ENCOUNTER — Telehealth: Payer: Self-pay | Admitting: Registered Nurse

## 2021-05-22 MED ORDER — HYDROCODONE-ACETAMINOPHEN 10-325 MG PO TABS
1.0000 | ORAL_TABLET | Freq: Every day | ORAL | 0 refills | Status: DC | PRN
  Filled 2021-05-22: qty 150, 32d supply, fill #0
  Filled 2021-05-23: qty 150, 30d supply, fill #0

## 2021-05-22 NOTE — Telephone Encounter (Signed)
PMP was Reviewed. Hydrocodone e-scribed today.  ?[name] is aware of the above via My-Chart message.  ? ?

## 2021-05-23 ENCOUNTER — Other Ambulatory Visit (HOSPITAL_COMMUNITY): Payer: Self-pay

## 2021-06-08 ENCOUNTER — Ambulatory Visit: Payer: BC Managed Care – PPO | Admitting: Internal Medicine

## 2021-06-20 ENCOUNTER — Telehealth: Payer: Self-pay | Admitting: Medical

## 2021-06-20 NOTE — Telephone Encounter (Signed)
Referral Followup °

## 2021-06-21 ENCOUNTER — Telehealth: Payer: Self-pay | Admitting: Registered Nurse

## 2021-06-21 MED ORDER — HYDROCODONE-ACETAMINOPHEN 10-325 MG PO TABS
1.0000 | ORAL_TABLET | Freq: Every day | ORAL | 0 refills | Status: DC | PRN
Start: 2021-06-21 — End: 2021-07-13

## 2021-06-21 NOTE — Telephone Encounter (Signed)
PMP was Reviewed.  ?Hydrocodone e-scribed . Ms. Riehle is aware via My- Chart Message  ? ?

## 2021-06-22 ENCOUNTER — Other Ambulatory Visit: Payer: Self-pay | Admitting: Registered Nurse

## 2021-06-22 ENCOUNTER — Other Ambulatory Visit (HOSPITAL_COMMUNITY): Payer: Self-pay

## 2021-06-25 ENCOUNTER — Other Ambulatory Visit: Payer: Self-pay | Admitting: Physical Medicine & Rehabilitation

## 2021-06-25 ENCOUNTER — Other Ambulatory Visit: Payer: Self-pay | Admitting: Internal Medicine

## 2021-06-25 DIAGNOSIS — E1159 Type 2 diabetes mellitus with other circulatory complications: Secondary | ICD-10-CM

## 2021-06-26 ENCOUNTER — Other Ambulatory Visit: Payer: Self-pay

## 2021-06-26 ENCOUNTER — Other Ambulatory Visit: Payer: Self-pay | Admitting: Internal Medicine

## 2021-06-26 DIAGNOSIS — Z87898 Personal history of other specified conditions: Secondary | ICD-10-CM

## 2021-06-26 DIAGNOSIS — I471 Supraventricular tachycardia: Secondary | ICD-10-CM

## 2021-06-26 MED ORDER — ROSUVASTATIN CALCIUM 20 MG PO TABS: 20.0000 mg | ORAL_TABLET | Freq: Every day | ORAL | 0 refills | Status: DC

## 2021-06-26 MED ORDER — CARVEDILOL 6.25 MG PO TABS: 6.2500 mg | ORAL_TABLET | Freq: Two times a day (BID) | ORAL | 0 refills | Status: DC

## 2021-06-26 MED ORDER — FUROSEMIDE 80 MG PO TABS: 80.0000 mg | ORAL_TABLET | Freq: Every day | ORAL | 0 refills | Status: DC

## 2021-06-26 MED ORDER — FERROUS GLUCONATE 324 (38 FE) MG PO TABS: 324.0000 mg | ORAL_TABLET | Freq: Every day | ORAL | 0 refills | Status: DC

## 2021-06-26 MED ORDER — TOUJEO MAX SOLOSTAR 300 UNIT/ML ~~LOC~~ SOPN
92.0000 [IU] | PEN_INJECTOR | Freq: Every day | SUBCUTANEOUS | 0 refills | Status: DC
Start: 2021-06-26 — End: 2021-09-18

## 2021-06-27 ENCOUNTER — Encounter: Payer: BC Managed Care – PPO | Admitting: Physical Medicine & Rehabilitation

## 2021-07-08 LAB — HM PAP SMEAR
HM Pap smear: NEGATIVE
HPV 16/18/45 genotyping: NEGATIVE

## 2021-07-13 ENCOUNTER — Telehealth (HOSPITAL_COMMUNITY): Payer: Self-pay | Admitting: Pharmacy Technician

## 2021-07-13 ENCOUNTER — Other Ambulatory Visit (HOSPITAL_COMMUNITY): Payer: Self-pay

## 2021-07-13 ENCOUNTER — Telehealth: Payer: Self-pay | Admitting: Registered Nurse

## 2021-07-13 ENCOUNTER — Encounter: Payer: BC Managed Care – PPO | Admitting: Registered Nurse

## 2021-07-13 MED ORDER — HYDROCODONE-ACETAMINOPHEN 10-325 MG PO TABS: 1.0000 | ORAL_TABLET | Freq: Every day | ORAL | 0 refills | Status: DC | PRN

## 2021-07-13 NOTE — Telephone Encounter (Signed)
Patient Advocate Encounter ?  ?Received notification that prior authorization for Dexcom G6 Transmitter is required. ?  ?PA submitted on 07/13/2021 ?Key [id number] ?Status is pending ?   ? ? ?Lady Deutscher, CPhT-Adv ?Pharmacy Patient Advocate Specialist ?[place] Patient Advocate Team ?Direct Number: [phone]  Fax: [phone] ? ?

## 2021-07-13 NOTE — Telephone Encounter (Signed)
[name] appointment was changed. Hydrocodone e-scribed to accommodate scheduled appointment.  ?[name] is aware of the above via My- Chart Message.  ?

## 2021-07-14 ENCOUNTER — Other Ambulatory Visit (HOSPITAL_COMMUNITY): Payer: Self-pay

## 2021-07-14 NOTE — Telephone Encounter (Signed)
Patient Advocate Encounter ? ?Prior Authorization for [employer] has been approved.   ? ?PA# [id number] ?Effective dates: 07/13/2021 through 07/14/2022 ? ?Patients co-pay is $90.  ? ? ? ?Lady Deutscher, CPhT-Adv ?Pharmacy Patient Advocate Specialist ?[facility] Patient Advocate Team ?Direct Number: [phone]  Fax: [phone] ? ?

## 2021-07-31 ENCOUNTER — Telehealth: Payer: Self-pay | Admitting: Medical

## 2021-07-31 ENCOUNTER — Encounter: Payer: BC Managed Care – PPO | Attending: Physical Medicine & Rehabilitation | Admitting: Registered Nurse

## 2021-07-31 ENCOUNTER — Encounter: Payer: Self-pay | Admitting: Registered Nurse

## 2021-07-31 ENCOUNTER — Other Ambulatory Visit: Payer: Self-pay

## 2021-07-31 VITALS — BP 145/79 | HR 71 | Ht 63.0 in | Wt 259.0 lb

## 2021-07-31 DIAGNOSIS — Z79891 Long term (current) use of opiate analgesic: Secondary | ICD-10-CM

## 2021-07-31 DIAGNOSIS — M5416 Radiculopathy, lumbar region: Secondary | ICD-10-CM

## 2021-07-31 DIAGNOSIS — Z5181 Encounter for therapeutic drug level monitoring: Secondary | ICD-10-CM

## 2021-07-31 DIAGNOSIS — G8929 Other chronic pain: Secondary | ICD-10-CM | POA: Insufficient documentation

## 2021-07-31 DIAGNOSIS — G894 Chronic pain syndrome: Secondary | ICD-10-CM | POA: Diagnosis present

## 2021-07-31 DIAGNOSIS — M545 Low back pain, unspecified: Secondary | ICD-10-CM

## 2021-07-31 DIAGNOSIS — M47817 Spondylosis without myelopathy or radiculopathy, lumbosacral region: Secondary | ICD-10-CM

## 2021-07-31 MED ORDER — NORTRIPTYLINE HCL 75 MG PO CAPS: 75.0000 mg | ORAL_CAPSULE | Freq: Every day | ORAL | 1 refills | Status: DC

## 2021-07-31 MED ORDER — POTASSIUM CHLORIDE ER 10 MEQ PO TBCR: 10.0000 meq | EXTENDED_RELEASE_TABLET | Freq: Two times a day (BID) | ORAL | 1 refills | Status: DC

## 2021-07-31 MED ORDER — HYDROCODONE-ACETAMINOPHEN 10-325 MG PO TABS: 1.0000 | ORAL_TABLET | Freq: Every day | ORAL | 0 refills | Status: DC | PRN

## 2021-07-31 MED ORDER — METHYLPREDNISOLONE 4 MG PO TBPK: ORAL_TABLET | ORAL | 0 refills | Status: DC

## 2021-07-31 MED ORDER — METHYLPREDNISOLONE 4 MG PO TBPK: ORAL_TABLET | ORAL | Status: DC

## 2021-07-31 NOTE — Progress Notes (Signed)
Subjective:    Patient ID: [name], female    DOB: 1960-07-27, 61 y.o.   MRN: [id number]  HPI: [name] is a 61 y.o. female who returns for follow up appointment for chronic pain and medication refill. She states her pain in her lower back has increased in intensity with only receiving 4-5 hours of pain relief with current medication regimen. She also reports her pain is located in  her lower back radiating into her bilateral hips. She rates her pain 7. Her current exercise regime is walking and performing stretching exercises.  [name] Morphine equivalent is 50 MME.   Last Oral Swab was Performed on 02/22/2021, it was consistent.     Pain Inventory Average Pain 7 Pain Right Now 7 My pain is constant, stabbing, and aching  In the last 24 hours, has pain interfered with the following? General activity 4 Relation with others 10 Enjoyment of life 10 What TIME of day is your pain at its worst? daytime Sleep (in general) Fair  Pain is worse with: walking, standing, and some activites Pain improves with: rest, pacing activities, and medication Relief from Meds: 6  Family History  Problem Relation Age of Onset   Hypertension Mother    Alzheimer's disease Mother    Diabetes Father    Breast cancer Sister 85   Anesthesia problems Neg Hx    Hypotension Neg Hx    Malignant hyperthermia Neg Hx    Pseudochol deficiency Neg Hx    Colon cancer Neg Hx    Esophageal cancer Neg Hx    Stomach cancer Neg Hx    Rectal cancer Neg Hx    Thyroid disease Neg Hx    Social History   Socioeconomic History   Marital status: Divorced    Spouse name: Not on file   Number of children: 0   Years of education: Not on file   Highest education level: Not on file  Occupational History   Occupation: TEACHER ASSISTANT  Tobacco Use   Smoking status: Never   Smokeless tobacco: Never  Vaping Use   Vaping Use: Never used  Substance and Sexual Activity   Alcohol use: No    Drug use: No   Sexual activity: Yes  Other Topics Concern   Not on file  Social History Narrative   Not on file   Social Determinants of Health   Financial Resource Strain: Not on file  Food Insecurity: Not on file  Transportation Needs: Not on file  Physical Activity: Not on file  Stress: Not on file  Social Connections: Not on file   Past Surgical History:  Procedure Laterality Date   ANAL EXAMINATION UNDER ANESTHESIA  02/21/11   anal fistula   BREAST SURGERY  patient does not remember date of procedure   pull fluid off lft br   CHOLECYSTECTOMY N/A 09/12/2018   Procedure: LAPAROSCOPIC CHOLECYSTECTOMY;  Surgeon: Stark Klein, MD;  Location: [place];  Service: General;  Laterality: N/A;   ELECTROPHYSIOLOGIC STUDY N/A 05/05/2015   Procedure: SVT Ablation;  Surgeon: Will Meredith Leeds, MD;  Location: [place] [facility];  Service: Cardiovascular;  Laterality: N/A;   EP IMPLANTABLE DEVICE N/A 01/30/2016   Procedure: Loop Recorder Insertion;  Surgeon: Sanda Klein, MD;  Location: [place] [facility];  Service: Cardiovascular;  Laterality: N/A;   INCISE AND DRAIN ABCESS     abscess on right thigh and buttock   KNEE ARTHROSCOPY     left   LAPAROSCOPIC APPENDECTOMY N/A  09/19/2018   Procedure: APPENDECTOMY LAPAROSCOPIC;  Surgeon: Clovis Riley, MD;  Location: [place];  Service: General;  Laterality: N/A;   SHOULDER SURGERY  04/14/09   right   Past Surgical History:  Procedure Laterality Date   ANAL EXAMINATION UNDER ANESTHESIA  02/21/11   anal fistula   BREAST SURGERY  patient does not remember date of procedure   pull fluid off lft br   CHOLECYSTECTOMY N/A 09/12/2018   Procedure: LAPAROSCOPIC CHOLECYSTECTOMY;  Surgeon: Stark Klein, MD;  Location: [place];  Service: General;  Laterality: N/A;   ELECTROPHYSIOLOGIC STUDY N/A 05/05/2015   Procedure: SVT Ablation;  Surgeon: Will Meredith Leeds, MD;  Location: [place] [facility];  Service: Cardiovascular;  Laterality: N/A;   EP IMPLANTABLE  DEVICE N/A 01/30/2016   Procedure: Loop Recorder Insertion;  Surgeon: Sanda Klein, MD;  Location: [place] [facility];  Service: Cardiovascular;  Laterality: N/A;   INCISE AND DRAIN ABCESS     abscess on right thigh and buttock   KNEE ARTHROSCOPY     left   LAPAROSCOPIC APPENDECTOMY N/A 09/19/2018   Procedure: APPENDECTOMY LAPAROSCOPIC;  Surgeon: Clovis Riley, MD;  Location: [place];  Service: General;  Laterality: N/A;   SHOULDER SURGERY  04/14/09   right   Past Medical History:  Diagnosis Date   Abscess    increased drainage from abscess on buttock   Anal fistula    Anxiety    Bilateral hip pain 05/27/2015   Chronic pain syndrome 05/27/2015   Depression    sees [name]   Diabetes mellitus without complication [facility])    Diverticulosis    Elevated alkaline phosphatase level    Fatty liver    Gallstones    Hiatal hernia    Hyperlipidemia    Hypertension    Internal hemorrhoids    Sleep apnea    2008- sleep study, neg. for sleep apnea    Stroke [facility])    SVT (supraventricular tachycardia) (HCC)    Symptomatic cholelithiasis 09/11/2018   Ht '5\' 3"'$  (1.6 m)   Wt 259 lb (117.5 kg)   LMP 02/09/2017   BMI 45.88 kg/m   Opioid Risk Score:   Fall Risk Score:  `1  Depression screen [facility] 2/9     05/11/2021   12:11 PM 12/28/2020    3:18 PM 11/21/2020    3:28 PM 09/02/2020   10:00 AM 06/23/2020    3:33 PM 02/29/2020    9:12 AM 09/03/2019    4:26 PM  Depression screen PHQ 2/9  Decreased Interest 0 0 0 0 0 0 0  Down, Depressed, Hopeless 0 0 0 0 0 0 0  PHQ - 2 Score 0 0 0 0 0 0 0  Altered sleeping       1  Tired, decreased energy       1  Change in appetite       0  Feeling bad or failure about yourself        0  Trouble concentrating       0  Moving slowly or fidgety/restless       0  Suicidal thoughts       0  PHQ-9 Score       2     Review of Systems  Genitourinary:  Positive for pelvic pain.  Musculoskeletal:  Positive for back pain.  All other systems reviewed and  are negative.     Objective:   Physical Exam Vitals and nursing note reviewed.  Constitutional:      Appearance: Normal appearance. She is obese.  Cardiovascular:     Rate and Rhythm: Normal rate and regular rhythm.     Pulses: Normal pulses.     Heart sounds: Normal heart sounds.  Pulmonary:     Effort: Pulmonary effort is normal.     Breath sounds: Normal breath sounds.  Musculoskeletal:     Cervical back: Normal range of motion and neck supple.     Comments: Normal Muscle Bulk and Muscle Testing Reveals:   Lumbar Paraspinal Tenderness: L-3-L-5 Left Greater Trochanter Tenderness Lower Extremities: Full ROM and Muscle Strength 5/5 Arises from Table Slowly Narrow Based  Gait     Skin:    General: Skin is warm and dry.  Neurological:     Mental Status: She is alert and oriented to person, place, and time.  Psychiatric:        Mood and Affect: Mood normal.        Behavior: Behavior normal.         Assessment & Plan:  Acute Exacerbation of chronic low back pain: RX: Medrol dose Pak. Continue HEP as tolerated. Continue to monitor.  2..Chronic Bilateral Hip pain L>R---endstage OA of hips..Continue HEP as tolerated and Heat and Ice Therapy. Refilled:  Hydrocodone 10/325 one tablet 5 times a day as needed for moderate pain #150. 07/31/2021 We will continue the opioid monitoring program, this consists of regular clinic visits, examinations, urine drug screen, pill counts as well as use of [place] Controlled Substance Reporting system. A 12 month History has been reviewed on the [place] Controlled Substance Reporting System on 03/01/2021 3.Lumbosacral Spondylosis: S/P MBB on 05/05/2021, with good relief noted. Lumbar Radiculitis:  Continue medication regimen with Pamelor. Continue Cymbalta and Continue  HEP as Tolerated.  Continue to Monitor. 03/01/2021.          -              4.  Morbid obesity: Continue with healthy diet Regime and HEP. Encouraged to continue with  healthy diet regime and HEP. 03/01/2021 5. .Bilateral  Greater Trochanteric Bursitis:.Continue with Heat and Ice Therapy. Continue to Monitor. 03/01/2021. 6. Left Knee Pain: No complaints Today.Continue with HEP as Tolerated. Continue to Monitor. 03/01/2021 7. Muscle Spasm: Continue  Methocarbamol. Continue to monitor. 12/21 /2022.    F/U in 2 months

## 2021-07-31 NOTE — Telephone Encounter (Signed)
Fax refill request from [facility] # [phone]  Pota chloride ER 10 MEQ tab #180

## 2021-08-03 ENCOUNTER — Ambulatory Visit: Payer: BC Managed Care – PPO | Admitting: Adult Health

## 2021-08-09 ENCOUNTER — Telehealth: Payer: Self-pay | Admitting: Internal Medicine

## 2021-08-09 NOTE — Telephone Encounter (Signed)
Pt called and states that she has been using gasx and its not helping its making things worse. 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 She wants to know is there something else she can use or be prescribed

## 2021-08-10 NOTE — Telephone Encounter (Signed)
Called and left message for pt to review my chart message and reply

## 2021-08-15 ENCOUNTER — Other Ambulatory Visit: Payer: Self-pay | Admitting: Medical

## 2021-08-15 ENCOUNTER — Telehealth: Payer: Self-pay

## 2021-08-15 MED ORDER — SIMETHICONE 180 MG PO CAPS: 1.0000 | ORAL_CAPSULE | Freq: Three times a day (TID) | ORAL | 2 refills | Status: DC | PRN

## 2021-08-15 NOTE — Telephone Encounter (Signed)
Called pharmacy to see if Simethicone was covered by insurance because even this strength is available OTC, and they can order one that is $2 cheaper and use a discount card and it will be $5.16 and it will be in a couple days #100, left message for pt

## 2021-08-15 NOTE — Telephone Encounter (Signed)
Left message for pt

## 2021-08-17 ENCOUNTER — Other Ambulatory Visit: Payer: Self-pay | Admitting: Medical

## 2021-08-21 ENCOUNTER — Ambulatory Visit: Payer: BC Managed Care – PPO | Admitting: Adult Health

## 2021-09-18 ENCOUNTER — Other Ambulatory Visit: Payer: Self-pay | Admitting: Medical

## 2021-09-18 ENCOUNTER — Other Ambulatory Visit: Payer: Self-pay | Admitting: Internal Medicine

## 2021-09-20 ENCOUNTER — Encounter: Payer: Self-pay | Admitting: Internal Medicine

## 2021-09-20 ENCOUNTER — Ambulatory Visit (INDEPENDENT_AMBULATORY_CARE_PROVIDER_SITE_OTHER): Payer: BC Managed Care – PPO | Admitting: Internal Medicine

## 2021-09-20 VITALS — BP 140/78 | HR 70 | Ht 63.0 in | Wt 242.0 lb

## 2021-09-20 DIAGNOSIS — E1159 Type 2 diabetes mellitus with other circulatory complications: Secondary | ICD-10-CM | POA: Diagnosis not present

## 2021-09-20 DIAGNOSIS — E1165 Type 2 diabetes mellitus with hyperglycemia: Secondary | ICD-10-CM | POA: Diagnosis not present

## 2021-09-20 DIAGNOSIS — Z794 Long term (current) use of insulin: Secondary | ICD-10-CM

## 2021-09-20 LAB — POCT GLUCOSE (DEVICE FOR HOME USE): Glucose Fasting, POC: 292 mg/dL — AB (ref 70–99)

## 2021-09-20 LAB — POCT GLYCOSYLATED HEMOGLOBIN (HGB A1C): Hemoglobin A1C: 13 % — AB (ref 4.0–5.6)

## 2021-09-20 MED ORDER — TOUJEO MAX SOLOSTAR 300 UNIT/ML ~~LOC~~ SOPN: 96.0000 [IU] | PEN_INJECTOR | Freq: Every day | SUBCUTANEOUS | 1 refills | Status: DC

## 2021-09-20 MED ORDER — FIASP FLEXTOUCH 100 UNIT/ML ~~LOC~~ SOPN: 36.0000 [IU] | PEN_INJECTOR | Freq: Three times a day (TID) | SUBCUTANEOUS | 1 refills | Status: DC

## 2021-09-20 MED ORDER — METFORMIN HCL ER 750 MG PO TB24: 750.0000 mg | ORAL_TABLET | Freq: Every day | ORAL | 1 refills | Status: DC

## 2021-09-20 MED ORDER — EMPAGLIFLOZIN 10 MG PO TABS: 10.0000 mg | ORAL_TABLET | Freq: Every day | ORAL | 1 refills | Status: DC

## 2021-09-20 NOTE — Progress Notes (Unsigned)
Name: [name]  Age/ Sex: 61 y.o., female   MRN/ DOB: [id number], December 14, 1960     PCP: Carlena Hurl, PA-C   Reason for Endocrinology Evaluation: Type 2 Diabetes Mellitus  Initial Endocrine Consultative Visit: 07/06/2020    PATIENT IDENTIFIER: [name] is a 61 y.o. female with a past medical history of SVT, MNG, T2DM and Dyslipidemia . The patient has followed with Endocrinology clinic since 07/06/2020 for consultative assistance with management of her diabetes.  DIABETIC HISTORY:  [name] was diagnosed with DM in 2011 ,Ozempic- severe nausea . Insulin started in 2015. Metformin- no intolerance , jardiance- no intolerance, soliqua. Her hemoglobin A1c has ranged from 6.5%  in 2016, peaking at 13.6% in 2021  THYROID HISTROY : She was found to have hypermetabolic  MNG on PET scan in 2016 during evaluation of ovarian cyst. These results were confirmed with thyroid ultrasound in 2017. She is S/P FNA of the isthmic and right lobe nodule with scant cellularity in 2017. She was lost to follow up until 06/2020, repeat ultrasound showed heterogenous gland but no specific nodules  Reduced Metformin due to low GFR 01/2021  Aldo: renin normal 09/2020   SUBJECTIVE:   During the last visit (01/2021): A1c 9.6 % We continued metformin and adjusted MDI regimen     Today (09/20/2021): [name] is here for a F/U on diabetes and MNG. She has NOT been to our clinic in 8 months.   She has not been checking glucose due to cost of sensors  and strips.      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   Diabetes mellitus without complication ([place])    Diverticulosis    Elevated alkaline phosphatase level    Fatty liver    Gallstones    Hiatal hernia    Hyperlipidemia    Hypertension    Internal hemorrhoids    Sleep apnea    2008- sleep study, neg. for sleep apnea    Stroke [facility])    SVT (supraventricular tachycardia) ([place])    Symptomatic cholelithiasis 09/11/2018   Past Surgical History:  Past Surgical History:  Procedure Laterality Date   ANAL EXAMINATION UNDER ANESTHESIA  02/21/11   anal fistula   BREAST SURGERY  patient does not remember date of procedure   pull fluid off lft br   CHOLECYSTECTOMY N/A 09/12/2018   Procedure: LAPAROSCOPIC CHOLECYSTECTOMY;  Surgeon: Stark Klein, MD;  Location: [place];  Service: General;  Laterality: N/A;   ELECTROPHYSIOLOGIC STUDY N/A 05/05/2015   Procedure: SVT Ablation;  Surgeon: Will Meredith Leeds, MD;  Location: [place] [facility];  Service: Cardiovascular;  Laterality: N/A;   EP IMPLANTABLE DEVICE N/A 01/30/2016   Procedure: Loop Recorder Insertion;  Surgeon: Sanda Klein, MD;  Location: [place] [facility];  Service: Cardiovascular;  Laterality: N/A;   INCISE AND DRAIN ABCESS     abscess on right thigh and buttock   KNEE ARTHROSCOPY     left   LAPAROSCOPIC APPENDECTOMY N/A 09/19/2018   Procedure: APPENDECTOMY LAPAROSCOPIC;  Surgeon: Clovis Riley, MD;  Location: [place] [facility];  Service: General;  Laterality: N/A;   SHOULDER SURGERY  04/14/09   right   Social History:  reports that she has  never smoked. She has never used smokeless tobacco. She reports that she does not drink alcohol and does not use drugs. Family History:  Family History  Problem Relation Age of Onset   Hypertension Mother    Alzheimer's disease Mother    Diabetes Father    Breast cancer Sister 67   Anesthesia problems Neg Hx    Hypotension Neg Hx    Malignant hyperthermia Neg Hx    Pseudochol deficiency Neg Hx    Colon cancer Neg Hx    Esophageal cancer Neg Hx    Stomach cancer Neg Hx    Rectal cancer Neg Hx    Thyroid disease Neg Hx      HOME MEDICATIONS: Allergies as of 09/20/2021       Reactions   Ozempic (0.25 Or 0.5 Mg-dose) [semaglutide(0.25 Or 0.81m-dos)]    Severe nausea, bad gas   Tramadol Nausea Only        Medication List        Accurate as of September 20, 2021  2:23 PM. If you have any questions, ask your nurse or doctor.          aspirin EC 81 MG tablet Take 1 tablet (81 mg total) by mouth daily.   budesonide-formoterol 160-4.5 MCG/ACT inhaler Commonly known as: Symbicort Inhale 2 puffs into the lungs 2 (two) times daily.   calcium carbonate 600 MG Tabs tablet Commonly known as: Calcium 600 Take 1 tablet (600 mg total) by mouth 2 (two) times daily with a meal.   carvedilol 6.25 MG tablet Commonly known as: COREG Take 1 tablet by mouth twice daily   DULoxetine 60 MG capsule Commonly known as: CYMBALTA Take 1 capsule by mouth twice daily   Emergen-C Immune Plus Pack Take 1 tablet by mouth 2 (two) times daily.   fenofibrate 145 MG tablet Commonly known as: TRICOR Take 1 tablet (145 mg total) by mouth daily.   ferrous gluconate 324 MG tablet Commonly known as: FERGON Take 1 tablet (324 mg total) by mouth daily with breakfast.   Fiasp FlexTouch 100 UNIT/ML FlexTouch Pen Generic drug: insulin aspart Inject 32 Units into the skin with breakfast, with lunch, and with evening meal.   furosemide 80 MG tablet Commonly known as: LASIX Take 1 tablet (80 mg  total) by mouth daily.   HYDROcodone-acetaminophen 10-325 MG tablet Commonly known as: NORCO Take 1 tablet by mouth 5 (five) times daily as needed. Do Not Fill Before 09/18/2021   metFORMIN 750 MG 24 hr tablet Commonly known as: GLUCOPHAGE-XR Take 1 tablet (750 mg total) by mouth daily at 6 (six) AM.   methylPREDNISolone 4 MG Tbpk tablet Commonly known as: MEDROL DOSEPAK Use as directed on Medrol Dose Pak   naloxone 4 MG/0.1ML Liqd nasal spray kit Commonly known as: NARCAN   nortriptyline 75 MG capsule Commonly known as: PAMELOR Take 1  capsule (75 mg total) by mouth at bedtime. Please remove previous prescription and fill 90 day supply.   Pen Needles 32G X 6 MM Misc Use as instructed to inject insulin in the morning, at noon, in the evening, and at bedtime   potassium chloride 10 MEQ tablet Commonly known as: KLOR-CON Take 1 tablet (10 mEq total) by mouth 2 (two) times daily.   rosuvastatin 20 MG tablet Commonly known as: CRESTOR Take 1 tablet by mouth once daily   Simethicone 180 MG Caps Take 1 capsule (180 mg total) by mouth 3 (three) times daily as needed.   [name] Max SoloStar 300 UNIT/ML Solostar Pen Generic drug: insulin glargine (2 Unit Dial) INJECT 92 UNITS SUBCUTANEOUSLY ONCE DAILY         OBJECTIVE:   Vital Signs: BP 140/78 (BP Location: Left Arm, Patient Position: Sitting, Cuff Size: Large)   Pulse 70   Ht 5' 3" (1.6 m)   Wt 242 lb (109.8 kg)   LMP 02/09/2017   SpO2 97%   BMI 42.87 kg/m   Wt Readings from Last 3 Encounters:  09/20/21 242 lb (109.8 kg)  07/31/21 259 lb (117.5 kg)  05/11/21 256 lb 12.8 oz (116.5 kg)     Exam: General: Pt appears well and is in NAD Thyromegaly noted   Lungs: Clear with good BS bilat with no rales, rhonchi, or wheezes  Heart: RRR   Extremities: Trace pretibial edema.   Neuro: MS is good with appropriate affect, pt is alert and Ox3      DM foot exam: 07/06/2020   The skin of the feet is  without sores or  ulcerations. The pedal pulses are 1+ on right and 1+ on left. The sensation is decreased at the heels to a screening 5.07, 10 gram monofilament bilaterally     DATA REVIEWED:  Lab Results  Component Value Date   HGBA1C 10.7 (H) 05/11/2021   HGBA1C 9.6 (A) 02/08/2021   HGBA1C 9.2 (A) 10/05/2020    Latest Reference Range & Units 02/08/21 15:59  Sodium 135 - 145 mEq/L 137  Potassium 3.5 - 5.1 mEq/L 4.4  Chloride 96 - 112 mEq/L 99  CO2 19 - 32 mEq/L 30  Glucose 70 - 99 mg/dL 329 (H)  BUN 6 - 23 mg/dL 19  Creatinine 0.40 - 1.20 mg/dL 1.39 (H)  Calcium 8.4 - 10.5 mg/dL 10.2  GFR >60.00 mL/min 41.33 (L)  Total CHOL/HDL Ratio  5  Cholesterol 0 - 200 mg/dL 156  HDL Cholesterol >39.00 mg/dL 34.20 (L)  Direct LDL mg/dL 89.0  MICROALB/CREAT RATIO 0.0 - 30.0 mg/g 1.2  Triglycerides 0.0 - 149.0 mg/dL 424.0 Triglyceride is over 400; calculations on Lipids are invalid. 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 No discrete nodules are identifiable separate from the background thyroid parenchyma on today's examination. Previously biopsied nodules now blend in with the background thyroid parenchyma.   IMPRESSION: Slight interval enlargement of the overall thyroid gland which is diffusely heterogeneous consistent with diffuse goitrous replacement.   Previously biopsied nodules are now no longer evident and have blended in with the background thyroid parenchyma. No discrete nodules or new suspicious findings identified.   FNA isthmic nodule 1.8 cm  03/31/2015 Scant epithelium     FNA right lower nodule 4.8 cm 03/31/2015 Scant epithelium   ASSESSMENT / PLAN / RECOMMENDATIONS:   1) Type 2 Diabetes Mellitus, poorly controlled, With  Neuropathic and macrovascular complications - Most recent A1c of 9.6%. Goal A1c <7.0%.      CGM and test strips have been cost prohibitve , I have asked her to  contact her pharmacy and see which gluocse meter they would cover and I'd be happy to prescribe it   MEDICATIONS: Decrease metformin 750 mg , 1 tablet  daily Continue [name]  92 units once daily Increase Fiasp to 36 units with Breakfast and 32 units with Lunch and Supper CF: Fiasp ( BG-130/20)  EDUCATION / INSTRUCTIONS: BG monitoring instructions: Patient is instructed to check her blood sugars 3 times a day, before meals. Call [facility] [facility] if: BG persistently < 70  I reviewed the Rule of 15 for the treatment of hypoglycemia in detail with the patient. Literature supplied.   2) Diabetic complications:  Eye: Does not have known diabetic retinopathy.  Neuro/ Feet: Does  have known diabetic peripheral neuropathy .  Renal: Patient does not have known baseline CKD. She   is  on an ACEI/ARB at present.    3) MNG:    -No local neck symptoms - She is S/P FNA of the isthmic and right nodule in 2017 with scan cellularity , she was lost to follow up until 07/2020 when a repeat Thyroid ultrasound showed increase thyroid heterogeneity but no actual thyroid nodules, previously biopsied nodules have blended in with a heterogeneous background - Will repeat again in 07/2021 to confirm         4) Subclinical hyperthyroidism:   - Pt is clinically euthyroid - Most likely due to [employer], but graves' disease is another differential , TRAb negative    5) Dyslipidemia :   - Tg very high, increased risk for pancreatitis - Will start fenofibrate and continue Rosuvastatin   Medication  Start Fenofibrate 145 mg daily  Rosuvastatin 20 mg daily      F/U in 4 months   Signed electronically by: Mack Guise, MD  [facility] Endocrinology  [facility] [address] [place], [place] [address] Phone: [phone] FAX: [phone]   CC: Carlena Hurl, PA-C [place] [id number] Phone: [phone]  Fax:  [phone]  Return to Endocrinology clinic as below: Future Appointments  Date Time Provider [place]  09/20/2021  2:40 PM Shamleffer, Melanie Crazier, MD [facility] None  10/04/2021  3:20 PM Bayard Hugger, NP [facility] CPR  05/14/2022 11:15 AM Tysinger, Camelia Eng, PA-C [facility]

## 2021-09-20 NOTE — Patient Instructions (Addendum)
-   Continue  Metformin 750 mg , 1 tablet Twice daily  - Increase [name] 96 units once daily  - Increase Fiasp 36 units with each meal  - Start Jardiance 10 mg daily   -Fiasp correctional insulin: ADD extra units on insulin to your meal-time Fiasp dose if your blood sugars are higher than 150. Use the scale below to help guide you:   Blood sugar before meal Number of units to inject  Less than 150 0 unit  151 -  170 1 units  171 -  190 2 units  191 -  210 3 units  211 -  230 4 units  231 -  250 5 units  251 -  270 6 units  271 -  290 7 units  291 -  310 8 units  311 - 330 9 units  331 - 350 10 units  351 - 370 11 units   371 - 390 12 units  391 - 410 13 units         HOW TO TREAT LOW BLOOD SUGARS (Blood sugar LESS THAN 70 MG/DL) Please follow the RULE OF 15 for the treatment of hypoglycemia treatment (when your (blood sugars are less than 70 mg/dL)   STEP 1: Take 15 grams of carbohydrates when your blood sugar is low, which includes:  3-4 GLUCOSE TABS  OR 3-4 OZ OF JUICE OR REGULAR SODA OR ONE TUBE OF GLUCOSE GEL    STEP 2: RECHECK blood sugar in 15 MINUTES STEP 3: If your blood sugar is still low at the 15 minute recheck --> then, go back to STEP 1 and treat AGAIN with another 15 grams of carbohydrates.

## 2021-09-21 MED ORDER — FENOFIBRATE 145 MG PO TABS: 145.0000 mg | ORAL_TABLET | Freq: Every day | ORAL | 3 refills | Status: DC

## 2021-09-21 MED ORDER — ROSUVASTATIN CALCIUM 20 MG PO TABS: 20.0000 mg | ORAL_TABLET | Freq: Every day | ORAL | 3 refills | Status: DC

## 2021-10-04 ENCOUNTER — Encounter: Payer: Self-pay | Admitting: Registered Nurse

## 2021-10-04 ENCOUNTER — Encounter: Payer: BC Managed Care – PPO | Attending: Physical Medicine & Rehabilitation | Admitting: Registered Nurse

## 2021-10-04 VITALS — BP 114/70 | HR 81 | Ht 63.0 in | Wt 242.6 lb

## 2021-10-04 DIAGNOSIS — Y92009 Unspecified place in unspecified non-institutional (private) residence as the place of occurrence of the external cause: Secondary | ICD-10-CM | POA: Insufficient documentation

## 2021-10-04 DIAGNOSIS — Z760 Encounter for issue of repeat prescription: Secondary | ICD-10-CM | POA: Diagnosis not present

## 2021-10-04 DIAGNOSIS — M7062 Trochanteric bursitis, left hip: Secondary | ICD-10-CM

## 2021-10-04 DIAGNOSIS — M47817 Spondylosis without myelopathy or radiculopathy, lumbosacral region: Secondary | ICD-10-CM | POA: Diagnosis not present

## 2021-10-04 DIAGNOSIS — F32A Depression, unspecified: Secondary | ICD-10-CM | POA: Diagnosis not present

## 2021-10-04 DIAGNOSIS — Z5181 Encounter for therapeutic drug level monitoring: Secondary | ICD-10-CM

## 2021-10-04 DIAGNOSIS — G894 Chronic pain syndrome: Secondary | ICD-10-CM

## 2021-10-04 DIAGNOSIS — M5416 Radiculopathy, lumbar region: Secondary | ICD-10-CM | POA: Diagnosis not present

## 2021-10-04 DIAGNOSIS — F419 Anxiety disorder, unspecified: Secondary | ICD-10-CM | POA: Insufficient documentation

## 2021-10-04 DIAGNOSIS — W19XXXD Unspecified fall, subsequent encounter: Secondary | ICD-10-CM | POA: Diagnosis not present

## 2021-10-04 DIAGNOSIS — M62838 Other muscle spasm: Secondary | ICD-10-CM | POA: Diagnosis not present

## 2021-10-04 DIAGNOSIS — M160 Bilateral primary osteoarthritis of hip: Secondary | ICD-10-CM | POA: Diagnosis not present

## 2021-10-04 DIAGNOSIS — M7061 Trochanteric bursitis, right hip: Secondary | ICD-10-CM | POA: Diagnosis not present

## 2021-10-04 DIAGNOSIS — Z79891 Long term (current) use of opiate analgesic: Secondary | ICD-10-CM

## 2021-10-04 MED ORDER — HYDROCODONE-ACETAMINOPHEN 10-325 MG PO TABS: 1.0000 | ORAL_TABLET | Freq: Every day | ORAL | 0 refills | Status: DC | PRN

## 2021-10-04 NOTE — Progress Notes (Signed)
Subjective:    Patient ID: [name], female    DOB: 1961/01/23, 61 y.o.   MRN: [id number]  HPI: [name] is a 61 y.o. female who returns for follow up appointment for chronic pain and medication refill. She states her pain is located in her lower back radiating into her bilateral hips and bilateral lower extremities L>R. She rates her pain 8. Her current exercise regime is walking and performing stretching exercises.  [name] reports on July 4th, she was getting out of the camper and the rug was wet, she slipped and landed on her left hip. Her family helped her up, she didn't seek medical attention. Educated on falls prevention, she verbalizes understanding.   [name] Morphine equivalent is 50.00 MME.   UDS ordered today.      Pain Inventory Average Pain 8 Pain Right Now 6 My pain is constant, sharp, stabbing, and aching  In the last 24 hours, has pain interfered with the following? General activity 7 Relation with others 7 Enjoyment of life 8 What TIME of day is your pain at its worst? daytime and evening Sleep (in general) Fair  Pain is worse with: walking, bending, standing, and some activites Pain improves with: rest, therapy/exercise, and medication Relief from Meds: 7  Family History  Problem Relation Age of Onset   Hypertension Mother    Alzheimer's disease Mother    Diabetes Father    Breast cancer Sister 20   Anesthesia problems Neg Hx    Hypotension Neg Hx    Malignant hyperthermia Neg Hx    Pseudochol deficiency Neg Hx    Colon cancer Neg Hx    Esophageal cancer Neg Hx    Stomach cancer Neg Hx    Rectal cancer Neg Hx    Thyroid disease Neg Hx    Social History   Socioeconomic History   Marital status: Divorced    Spouse name: Not on file   Number of children: 0   Years of education: Not on file   Highest education level: Not on file  Occupational History   Occupation: TEACHER ASSISTANT  Tobacco Use   Smoking status:  Never   Smokeless tobacco: Never  Vaping Use   Vaping Use: Never used  Substance and Sexual Activity   Alcohol use: No   Drug use: No   Sexual activity: Yes  Other Topics Concern   Not on file  Social History Narrative   Not on file   Social Determinants of Health   Financial Resource Strain: Not on file  Food Insecurity: Food Insecurity Present (09/03/2019)   Hunger Vital Sign    Worried About Running Out of Food in the Last Year: Sometimes true    Ran Out of Food in the Last Year: Sometimes true  Transportation Needs: No Transportation Needs (09/03/2019)   PRAPARE - Hydrologist (Medical): No    Lack of Transportation (Non-Medical): No  Physical Activity: Not on file  Stress: Not on file  Social Connections: Not on file   Past Surgical History:  Procedure Laterality Date   ANAL EXAMINATION UNDER ANESTHESIA  02/21/11   anal fistula   BREAST SURGERY  patient does not remember date of procedure   pull fluid off lft br   CHOLECYSTECTOMY N/A 09/12/2018   Procedure: LAPAROSCOPIC CHOLECYSTECTOMY;  Surgeon: Stark Klein, MD;  Location: [facility];  Service: General;  Laterality: N/A;   ELECTROPHYSIOLOGIC STUDY N/A 05/05/2015   Procedure: SVT Ablation;  Surgeon: Will Meredith Leeds, MD;  Location: [place] [facility];  Service: Cardiovascular;  Laterality: N/A;   EP IMPLANTABLE DEVICE N/A 01/30/2016   Procedure: Loop Recorder Insertion;  Surgeon: Sanda Klein, MD;  Location: [place] [facility];  Service: Cardiovascular;  Laterality: N/A;   INCISE AND DRAIN ABCESS     abscess on right thigh and buttock   KNEE ARTHROSCOPY     left   LAPAROSCOPIC APPENDECTOMY N/A 09/19/2018   Procedure: APPENDECTOMY LAPAROSCOPIC;  Surgeon: Clovis Riley, MD;  Location: [facility];  Service: General;  Laterality: N/A;   SHOULDER SURGERY  04/14/09   right   Past Surgical History:  Procedure Laterality Date   ANAL EXAMINATION UNDER ANESTHESIA  02/21/11   anal fistula   BREAST  SURGERY  patient does not remember date of procedure   pull fluid off lft br   CHOLECYSTECTOMY N/A 09/12/2018   Procedure: LAPAROSCOPIC CHOLECYSTECTOMY;  Surgeon: Stark Klein, MD;  Location: [facility];  Service: General;  Laterality: N/A;   ELECTROPHYSIOLOGIC STUDY N/A 05/05/2015   Procedure: SVT Ablation;  Surgeon: Will Meredith Leeds, MD;  Location: [place] [facility];  Service: Cardiovascular;  Laterality: N/A;   EP IMPLANTABLE DEVICE N/A 01/30/2016   Procedure: Loop Recorder Insertion;  Surgeon: Sanda Klein, MD;  Location: [place];  Service: Cardiovascular;  Laterality: N/A;   INCISE AND DRAIN ABCESS     abscess on right thigh and buttock   KNEE ARTHROSCOPY     left   LAPAROSCOPIC APPENDECTOMY N/A 09/19/2018   Procedure: APPENDECTOMY LAPAROSCOPIC;  Surgeon: Clovis Riley, MD;  Location: [facility];  Service: General;  Laterality: N/A;   SHOULDER SURGERY  04/14/09   right   Past Medical History:  Diagnosis Date   Abscess    increased drainage from abscess on buttock   Anal fistula    Anxiety    Bilateral hip pain 05/27/2015   Chronic pain syndrome 05/27/2015   Depression    sees [name]   Diabetes mellitus without complication [facility])    Diverticulosis    Elevated alkaline phosphatase level    Fatty liver    Gallstones    Hiatal hernia    Hyperlipidemia    Hypertension    Internal hemorrhoids    Sleep apnea    2008- sleep study, neg. for sleep apnea    Stroke ([place])    SVT (supraventricular tachycardia) (HCC)    Symptomatic cholelithiasis 09/11/2018   Pulse 81   Ht '5\' 3"'$  (1.6 m)   Wt 242 lb 9.6 oz (110 kg)   LMP 02/09/2017   SpO2 98%   BMI 42.97 kg/m   Opioid Risk Score:   Fall Risk Score:  `1  Depression screen [facility] 2/9     10/04/2021    3:08 PM 05/11/2021   12:11 PM 12/28/2020    3:18 PM 11/21/2020    3:28 PM 09/02/2020   10:00 AM 06/23/2020    3:33 PM 02/29/2020    9:12 AM  Depression screen PHQ 2/9  Decreased Interest 0 0 0 0 0 0 0  Down, Depressed,  Hopeless 0 0 0 0 0 0 0  PHQ - 2 Score 0 0 0 0 0 0 0     Review of Systems  Constitutional: Negative.   HENT: Negative.    Eyes: Negative.   Respiratory: Negative.    Cardiovascular:  Positive for leg swelling.  Gastrointestinal:  Positive for abdominal pain.  Endocrine: Negative.   Genitourinary:  Negative.   Musculoskeletal:  Positive for back pain.  Skin: Negative.   Allergic/Immunologic: Negative.   Neurological: Negative.   Hematological: Negative.   Psychiatric/Behavioral: Negative.        Objective:   Physical Exam Vitals and nursing note reviewed.  Constitutional:      Appearance: Normal appearance.  Cardiovascular:     Rate and Rhythm: Normal rate and regular rhythm.     Pulses: Normal pulses.     Heart sounds: Normal heart sounds.  Pulmonary:     Effort: Pulmonary effort is normal.     Breath sounds: Normal breath sounds.  Musculoskeletal:     Cervical back: Normal range of motion and neck supple.     Comments: Normal Muscle Bulk and Muscle Testing Reveals:  Upper Extremities: Full ROM and Muscle Strength 5/5 Lumbar Hypersensitivity Bilateral Greater Trochanter Tenderness Lower Extremities: Decreased ROM and Muscle Strength 5/5 Arises from Table Slowly Narrow Based Gait     Skin:    General: Skin is warm and dry.  Neurological:     Mental Status: She is alert and oriented to person, place, and time.  Psychiatric:        Mood and Affect: Mood normal.        Behavior: Behavior normal.         Assessment & Plan:  1.Chronic Bilateral Hip pain L>R---endstage OA of hips..Continue HEP as tolerated and Heat and Ice Therapy. Refilled:  Hydrocodone 10/325 one tablet 5 times a day as needed for moderate pain #150. 10/04/2021 We will continue the opioid monitoring program, this consists of regular clinic visits, examinations, urine drug screen, pill counts as well as use of [place] Controlled Substance Reporting system. A 12 month History has been reviewed  on the [place] Controlled Substance Reporting System on 10/04/2021 2. Lumbar Radiculitis:  Continue medication regimen with Pamelor. Continue Cymbalta and Continue  HEP as Tolerated.  Continue to Monitor. 10/04/2021.          -              3. Morbid obesity: Continue with healthy diet Regime and HEP. Encouraged to continue with healthy diet regime and HEP. 10/04/2021 4.Bilateral  Greater Trochanteric Bursitis:.Continue with Heat and Ice Therapy. Continue to Monitor. 10/04/2021. 5. Left Knee Pain: No complaints Today.Continue with HEP as Tolerated. Continue to Monitor. 10/04/2021 6. Muscle Spasm: Continue  Methocarbamol. Continue to monitor. 07/26 /2023.  7. Fall at Home: Educated on falls prevention , she verbalizes understanding.    F/U in 2 months

## 2021-10-10 LAB — TOXASSURE SELECT,+ANTIDEPR,UR

## 2021-10-10 LAB — HM DIABETES EYE EXAM

## 2021-10-16 ENCOUNTER — Telehealth: Payer: Self-pay | Admitting: *Deleted

## 2021-10-16 NOTE — Telephone Encounter (Signed)
Urine drug screen for this encounter is consistent for prescribed medication 

## 2021-10-19 ENCOUNTER — Other Ambulatory Visit: Payer: Self-pay | Admitting: Medical

## 2021-10-19 ENCOUNTER — Other Ambulatory Visit: Payer: Self-pay

## 2021-10-23 ENCOUNTER — Other Ambulatory Visit: Payer: Self-pay | Admitting: Medical

## 2021-10-23 DIAGNOSIS — Z87898 Personal history of other specified conditions: Secondary | ICD-10-CM

## 2021-10-23 DIAGNOSIS — I471 Supraventricular tachycardia: Secondary | ICD-10-CM

## 2021-10-23 MED ORDER — FUROSEMIDE 80 MG PO TABS: 80.0000 mg | ORAL_TABLET | Freq: Every day | ORAL | 1 refills | Status: DC

## 2021-10-30 ENCOUNTER — Telehealth: Payer: Self-pay

## 2021-10-30 NOTE — Telephone Encounter (Signed)
Pt lvm requesting assistance with getting Dexcom sensors. Called pt and lvm for her to call back to clarify what is needed.

## 2021-11-08 ENCOUNTER — Other Ambulatory Visit: Payer: Self-pay | Admitting: Medical

## 2021-11-09 ENCOUNTER — Encounter: Payer: Self-pay | Admitting: Internal Medicine

## 2021-11-15 ENCOUNTER — Encounter: Payer: Self-pay | Admitting: Internal Medicine

## 2021-11-16 ENCOUNTER — Ambulatory Visit: Payer: BC Managed Care – PPO | Admitting: Medical

## 2021-11-16 VITALS — BP 104/64 | HR 81 | Temp 97.6°F | Wt 249.2 lb

## 2021-11-16 DIAGNOSIS — B3731 Acute candidiasis of vulva and vagina: Secondary | ICD-10-CM | POA: Diagnosis not present

## 2021-11-16 DIAGNOSIS — Z23 Encounter for immunization: Secondary | ICD-10-CM

## 2021-11-16 DIAGNOSIS — J4530 Mild persistent asthma, uncomplicated: Secondary | ICD-10-CM

## 2021-11-16 DIAGNOSIS — Z79899 Other long term (current) drug therapy: Secondary | ICD-10-CM | POA: Diagnosis not present

## 2021-11-16 DIAGNOSIS — I10 Essential (primary) hypertension: Secondary | ICD-10-CM

## 2021-11-16 DIAGNOSIS — E1165 Type 2 diabetes mellitus with hyperglycemia: Secondary | ICD-10-CM

## 2021-11-16 DIAGNOSIS — Z794 Long term (current) use of insulin: Secondary | ICD-10-CM

## 2021-11-16 MED ORDER — FLUCONAZOLE 100 MG PO TABS: 100.0000 mg | ORAL_TABLET | Freq: Every day | ORAL | 0 refills | Status: DC
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
## 2021-11-19 ENCOUNTER — Other Ambulatory Visit: Payer: Self-pay | Admitting: Medical

## 2021-11-19 MED ORDER — METOPROLOL SUCCINATE ER 25 MG PO TB24: 25.0000 mg | ORAL_TABLET | Freq: Every day | ORAL | 2 refills | Status: DC

## 2021-11-20 ENCOUNTER — Encounter: Payer: Self-pay | Admitting: Internal Medicine

## 2021-11-20 NOTE — Progress Notes (Signed)
Left message for pt to call me back 

## 2021-11-20 NOTE — Progress Notes (Signed)
Spoke to patient and gave her recommendation from [place] and also she asked to send it through [employer]

## 2021-11-22 ENCOUNTER — Encounter: Payer: Self-pay | Admitting: Medical

## 2021-12-07 ENCOUNTER — Encounter: Payer: Self-pay | Admitting: Registered Nurse

## 2021-12-07 ENCOUNTER — Encounter: Payer: BC Managed Care – PPO | Attending: Physical Medicine & Rehabilitation | Admitting: Registered Nurse

## 2021-12-07 VITALS — BP 120/74 | HR 84 | Ht 63.0 in

## 2021-12-07 DIAGNOSIS — M7062 Trochanteric bursitis, left hip: Secondary | ICD-10-CM

## 2021-12-07 DIAGNOSIS — G894 Chronic pain syndrome: Secondary | ICD-10-CM

## 2021-12-07 DIAGNOSIS — Z5181 Encounter for therapeutic drug level monitoring: Secondary | ICD-10-CM | POA: Diagnosis present

## 2021-12-07 DIAGNOSIS — Z79891 Long term (current) use of opiate analgesic: Secondary | ICD-10-CM | POA: Insufficient documentation

## 2021-12-07 DIAGNOSIS — M7061 Trochanteric bursitis, right hip: Secondary | ICD-10-CM | POA: Insufficient documentation

## 2021-12-07 DIAGNOSIS — M47817 Spondylosis without myelopathy or radiculopathy, lumbosacral region: Secondary | ICD-10-CM | POA: Insufficient documentation

## 2021-12-07 DIAGNOSIS — M5416 Radiculopathy, lumbar region: Secondary | ICD-10-CM

## 2021-12-07 MED ORDER — LIDOCAINE 5 % EX PTCH: 1.0000 | MEDICATED_PATCH | Freq: Two times a day (BID) | CUTANEOUS | 4 refills | Status: DC

## 2021-12-07 NOTE — Patient Instructions (Signed)
Send a My- Chart Message on Monday:  Keep a Pain Journal and let me know how many hours of relief you are receiving when you take your hydrocodone.

## 2021-12-07 NOTE — Progress Notes (Signed)
Subjective:    Patient ID: [name], female    DOB: Jan 30, 1961, 61 y.o.   MRN: [id number]  HPI: [name] is a 61 y.o. female who returns for follow up appointment for chronic pain and medication refill. She states her pain is located in her lower back radiating into her bilateral hips and left lower extremity. She reports increase intensity of lower back pain. She was instructed to keep a pain journal and send a My-Chart message on Monday 12/11/2021, she verbalizes understanding. Lidocaine patches ordered today, awaiting on insurance approval, she verbalizes understanding. She  rates her pain 7. Her current exercise regime is walking and performing stretching exercises.  [name] Morphine equivalent is 50.00 MME.   Last UDS was Performed on 10/04/2021, it was consistent.   Pain Inventory Average Pain 8 Pain Right Now 7 My pain is sharp, stabbing, tingling, and aching  In the last 24 hours, has pain interfered with the following? General activity 9 Relation with others 9 Enjoyment of life 9 What TIME of day is your pain at its worst? daytime Sleep (in general) Poor  Pain is worse with: walking, bending, standing, and some activites Pain improves with: rest, heat/ice, and medication Relief from Meds: 7  Family History  Problem Relation Age of Onset   Hypertension Mother    Alzheimer's disease Mother    Diabetes Father    Breast cancer Sister 53   Anesthesia problems Neg Hx    Hypotension Neg Hx    Malignant hyperthermia Neg Hx    Pseudochol deficiency Neg Hx    Colon cancer Neg Hx    Esophageal cancer Neg Hx    Stomach cancer Neg Hx    Rectal cancer Neg Hx    Thyroid disease Neg Hx    Social History   Socioeconomic History   Marital status: Divorced    Spouse name: Not on file   Number of children: 0   Years of education: Not on file   Highest education level: Not on file  Occupational History   Occupation: TEACHER ASSISTANT  Tobacco Use    Smoking status: Never   Smokeless tobacco: Never  Vaping Use   Vaping Use: Never used  Substance and Sexual Activity   Alcohol use: No   Drug use: No   Sexual activity: Yes  Other Topics Concern   Not on file  Social History Narrative   Not on file   Social Determinants of Health   Financial Resource Strain: Not on file  Food Insecurity: Food Insecurity Present (09/03/2019)   Hunger Vital Sign    Worried About Running Out of Food in the Last Year: Sometimes true    Ran Out of Food in the Last Year: Sometimes true  Transportation Needs: No Transportation Needs (09/03/2019)   PRAPARE - Hydrologist (Medical): No    Lack of Transportation (Non-Medical): No  Physical Activity: Not on file  Stress: Not on file  Social Connections: Not on file   Past Surgical History:  Procedure Laterality Date   ANAL EXAMINATION UNDER ANESTHESIA  02/21/11   anal fistula   BREAST SURGERY  patient does not remember date of procedure   pull fluid off lft br   CHOLECYSTECTOMY N/A 09/12/2018   Procedure: LAPAROSCOPIC CHOLECYSTECTOMY;  Surgeon: Stark Klein, MD;  Location: [place];  Service: General;  Laterality: N/A;   ELECTROPHYSIOLOGIC STUDY N/A 05/05/2015   Procedure: SVT Ablation;  Surgeon: Will Meredith Leeds, MD;  Location: [place] [facility];  Service: Cardiovascular;  Laterality: N/A;   EP IMPLANTABLE DEVICE N/A 01/30/2016   Procedure: Loop Recorder Insertion;  Surgeon: Sanda Klein, MD;  Location: [place] [facility];  Service: Cardiovascular;  Laterality: N/A;   INCISE AND DRAIN ABCESS     abscess on right thigh and buttock   KNEE ARTHROSCOPY     left   LAPAROSCOPIC APPENDECTOMY N/A 09/19/2018   Procedure: APPENDECTOMY LAPAROSCOPIC;  Surgeon: Clovis Riley, MD;  Location: [place] [facility];  Service: General;  Laterality: N/A;   SHOULDER SURGERY  04/14/09   right   Past Surgical History:  Procedure Laterality Date   ANAL EXAMINATION UNDER ANESTHESIA  02/21/11   anal  fistula   BREAST SURGERY  patient does not remember date of procedure   pull fluid off lft br   CHOLECYSTECTOMY N/A 09/12/2018   Procedure: LAPAROSCOPIC CHOLECYSTECTOMY;  Surgeon: Stark Klein, MD;  Location: [place];  Service: General;  Laterality: N/A;   ELECTROPHYSIOLOGIC STUDY N/A 05/05/2015   Procedure: SVT Ablation;  Surgeon: Will Meredith Leeds, MD;  Location: [place] [facility];  Service: Cardiovascular;  Laterality: N/A;   EP IMPLANTABLE DEVICE N/A 01/30/2016   Procedure: Loop Recorder Insertion;  Surgeon: Sanda Klein, MD;  Location: [place] [facility];  Service: Cardiovascular;  Laterality: N/A;   INCISE AND DRAIN ABCESS     abscess on right thigh and buttock   KNEE ARTHROSCOPY     left   LAPAROSCOPIC APPENDECTOMY N/A 09/19/2018   Procedure: APPENDECTOMY LAPAROSCOPIC;  Surgeon: Clovis Riley, MD;  Location: [place];  Service: General;  Laterality: N/A;   SHOULDER SURGERY  04/14/09   right   Past Medical History:  Diagnosis Date   Abscess    increased drainage from abscess on buttock   Anal fistula    Anxiety    Bilateral hip pain 05/27/2015   Chronic pain syndrome 05/27/2015   Depression    sees [name]   Diabetes mellitus without complication [facility])    Diverticulosis    Elevated alkaline phosphatase level    Fatty liver    Gallstones    Hiatal hernia    Hyperlipidemia    Hypertension    Internal hemorrhoids    Sleep apnea    2008- sleep study, neg. for sleep apnea    Stroke [facility])    SVT (supraventricular tachycardia) (HCC)    Symptomatic cholelithiasis 09/11/2018   BP 120/74   Pulse 84   Ht '5\' 3"'$  (1.6 m)   LMP 02/09/2017   SpO2 98%   BMI 44.14 kg/m   Opioid Risk Score:   Fall Risk Score:  `1  Depression screen [facility] 2/9     10/04/2021    3:08 PM 05/11/2021   12:11 PM 12/28/2020    3:18 PM 11/21/2020    3:28 PM 09/02/2020   10:00 AM 06/23/2020    3:33 PM 02/29/2020    9:12 AM  Depression screen PHQ 2/9  Decreased Interest 0 0 0 0 0 0 0  Down,  Depressed, Hopeless 0 0 0 0 0 0 0  PHQ - 2 Score 0 0 0 0 0 0 0      Review of Systems  Musculoskeletal:  Positive for back pain.       Thigh Pain  All other systems reviewed and are negative.     Objective:   Physical Exam Vitals and nursing note reviewed.  Constitutional:      Appearance: Normal appearance. She  is obese.  Cardiovascular:     Rate and Rhythm: Normal rate and regular rhythm.     Pulses: Normal pulses.     Heart sounds: Normal heart sounds.  Pulmonary:     Effort: Pulmonary effort is normal.     Breath sounds: Normal breath sounds.  Musculoskeletal:     Cervical back: Normal range of motion and neck supple.     Comments: Normal Muscle Bulk and Muscle Testing Reveals:  Upper Extremities: Full ROM and Muscle Strength 5/5 Lumbar Hypersensitivity Lower Extremities: Right: Full ROM and Muscle Strength 5/5 Left Lower Extremity: Decreased ROM and Muscle Strength 5/5 Left Lower Extremity Flexion Produces Pain into her Left Lower Extremity Arises from Table Slowly Narrow Based  Gait     Skin:    General: Skin is warm and dry.  Neurological:     Mental Status: She is alert and oriented to person, place, and time.  Psychiatric:        Mood and Affect: Mood normal.        Behavior: Behavior normal.         Assessment & Plan:  Acute exacerbation of Chronic Low Back Pain: RX: Lidocaine Patches: Keep Pain Journal and send My-Chart message on Monday 12/11/2021, she verbalizes understanding. Previous Lumbar X-ray was Reviewed. Continue to Monitor. 2. Chronic Bilateral Hip pain L>R---endstage OA of hips..Continue HEP as tolerated and Heat and Ice Therapy. Continue Hydrocodone 10/325 one tablet 5 times a day as needed for moderate pain #150. 12/07/2021 We will continue the opioid monitoring program, this consists of regular clinic visits, examinations, urine drug screen, pill counts as well as use of [place] Controlled Substance Reporting system. A 12 month  History has been reviewed on the [place] Controlled Substance Reporting System on 12/07/2021 3. Lumbar Radiculitis:  Continue medication regimen with Pamelor. Continue Cymbalta and Continue  HEP as Tolerated.  Continue to Monitor. 12/07/2021.          -              4. Morbid obesity: Continue with healthy diet Regime and HEP. Encouraged to continue with healthy diet regime and HEP. 12/07/2021 5.Bilateral  Greater Trochanteric Bursitis:.Continue with Heat and Ice Therapy. Continue to Monitor. 12/07/2021. 6. Left Knee Pain: No complaints Today.Continue with HEP as Tolerated. Continue to Monitor. 12/07/2021 7. Muscle Spasm: Continue  Methocarbamol. Continue to monitor. 09/28 /2023.    F/U in 2 months

## 2021-12-10 ENCOUNTER — Other Ambulatory Visit: Payer: Self-pay | Admitting: Internal Medicine

## 2021-12-19 ENCOUNTER — Encounter: Payer: Self-pay | Admitting: Internal Medicine

## 2021-12-19 ENCOUNTER — Telehealth: Payer: Self-pay | Admitting: Registered Nurse

## 2021-12-19 MED ORDER — OXYCODONE-ACETAMINOPHEN 7.5-325 MG PO TABS: 1.0000 | ORAL_TABLET | Freq: Four times a day (QID) | ORAL | 0 refills | Status: DC | PRN

## 2021-12-19 NOTE — Telephone Encounter (Signed)
PMP was Reviewed.  Hydrocodone ineffective. Oxycodone e-scribed today. [name] is aware of the above via My Chart message.

## 2022-01-02 ENCOUNTER — Other Ambulatory Visit: Payer: Self-pay | Admitting: Medical

## 2022-01-02 DIAGNOSIS — F4310 Post-traumatic stress disorder, unspecified: Secondary | ICD-10-CM

## 2022-01-02 DIAGNOSIS — F419 Anxiety disorder, unspecified: Secondary | ICD-10-CM

## 2022-01-02 MED ORDER — HYDROXYZINE HCL 10 MG PO TABS: 10.0000 mg | ORAL_TABLET | Freq: Two times a day (BID) | ORAL | 0 refills | Status: DC | PRN

## 2022-01-06 ENCOUNTER — Other Ambulatory Visit: Payer: Self-pay | Admitting: Medical

## 2022-01-19 ENCOUNTER — Other Ambulatory Visit: Payer: Self-pay

## 2022-01-19 ENCOUNTER — Other Ambulatory Visit: Payer: Self-pay | Admitting: Physical Medicine and Rehabilitation

## 2022-01-19 MED ORDER — OXYCODONE-ACETAMINOPHEN 7.5-325 MG PO TABS: 1.0000 | ORAL_TABLET | Freq: Four times a day (QID) | ORAL | 0 refills | Status: DC | PRN

## 2022-01-20 ENCOUNTER — Other Ambulatory Visit: Payer: Self-pay | Admitting: Medical

## 2022-02-08 ENCOUNTER — Encounter: Payer: BC Managed Care – PPO | Admitting: Registered Nurse

## 2022-02-08 ENCOUNTER — Telehealth: Payer: Self-pay | Admitting: Registered Nurse

## 2022-02-08 MED ORDER — OXYCODONE-ACETAMINOPHEN 7.5-325 MG PO TABS: 1.0000 | ORAL_TABLET | Freq: Four times a day (QID) | ORAL | 0 refills | Status: DC | PRN

## 2022-02-08 NOTE — Telephone Encounter (Signed)
PMP was Reviewed. Oxycodone e-scribed to pharmacy Ms. Alvidrez is aware via My- Chart.

## 2022-02-11 ENCOUNTER — Other Ambulatory Visit: Payer: Self-pay | Admitting: Medical

## 2022-02-23 ENCOUNTER — Ambulatory Visit: Payer: BC Managed Care – PPO | Admitting: Internal Medicine

## 2022-02-27 ENCOUNTER — Telehealth (INDEPENDENT_AMBULATORY_CARE_PROVIDER_SITE_OTHER): Payer: BC Managed Care – PPO | Admitting: Nurse Practitioner

## 2022-02-27 ENCOUNTER — Encounter: Payer: Self-pay | Admitting: Nurse Practitioner

## 2022-02-27 VITALS — Ht 63.0 in | Wt 241.0 lb

## 2022-02-27 DIAGNOSIS — J0140 Acute pansinusitis, unspecified: Secondary | ICD-10-CM | POA: Diagnosis not present

## 2022-02-27 MED ORDER — FLUCONAZOLE 150 MG PO TABS: ORAL_TABLET | ORAL | 2 refills | Status: DC

## 2022-02-27 MED ORDER — AMOXICILLIN-POT CLAVULANATE 875-125 MG PO TABS: 1.0000 | ORAL_TABLET | Freq: Two times a day (BID) | ORAL | 0 refills | Status: DC

## 2022-02-27 NOTE — Patient Instructions (Signed)
Your symptoms today are consistent with a sinus infection.   The following information is provided as a Human resources officer for ADULT patients only and does NOT take into account PREGNANCY, ALLERGIES, LIVER CONDITIONS, KIDNEY CONDITIONS, GASTROINTESTINAL CONDITIONS, OR PRESCRIPTION MEDICATION INTERACTIONS. Please be sure to ask your provider if the following are safe to take with your specific medical history, conditions, or current medication regimen if you are unsure.   Adult Basic Symptom Management for Sinusitis  Congestion: Guaifenesin (Mucinex)- follow directions on packaging with a maximum dose of '2400mg'$  in a 24 hour period.  Pain/Fever: Ibuprofen '200mg'$  - '400mg'$  every 4-6 hours as needed (MAX '1200mg'$  in a 24 hour period) Pain/Fever: Tylenol '500mg'$  -'1000mg'$  every 6-8 hours as needed (MAX '3000mg'$  in a 24 hour period)  Cough: Dextromethorphan (Delsym)- follow directions on packing with a maximum dose of '120mg'$  in a 24 hour period.  Nasal Stuffiness: Saline nasal spray and/or Nettie Pot with sterile saline solution  Runny Nose: Fluticasone nasal spray (Flonase) OR Mometasone nasal spray (Nasonex) OR Triamcinolone Acetonide nasal spray (Nasacort)- follow directions on the packaging  Pain/Pressure: Warm washcloth to the face  Sore Throat: Warm salt water gargles  If you have allergies, you may also consider taking an oral antihistamine (like Zyrtec or Claritin) as these may also help with your symptoms.  **Many medications will have more than one ingredient, be sure you are reading the packaging carefully and not taking more than one dose of the same kind of medication at the same time or too close together. It is OK to use formulas that have all of the ingredients you want, but do not take them in a combined medication and as separate dose too close together. If you have any questions, the pharmacist will be happy to help you decide what is safe.

## 2022-02-27 NOTE — Assessment & Plan Note (Addendum)
Symptoms and presentation consistent with bacterial sinusitis. We will plan to start antibiotic therapy today for management given that supportive treatment has not been effective. Recommendations for continued OTC treatment discussed and provided on AVS. Will send diflucan in the even yeast symptoms develop after antibiotic use. Patient aware to follow-up if her symptoms fail to improve. Recommend starting antibiotic therapy as soon as possible to get medication in her system. No alarm sx present at this time. If shortness of breath worsens, patient aware to follow-up or seek emergency care.

## 2022-02-27 NOTE — Progress Notes (Signed)
Virtual Visit Encounter mychart visit.   I connected with  [name] on 02/27/22 at 11:30 AM EST by secure video and audio telemedicine application. I verified that I am speaking with the correct person using two identifiers.   I introduced myself as a Designer, jewellery with the practice. The limitations of evaluation and management by telemedicine discussed with the patient and the availability of in person appointments. The patient expressed verbal understanding and consent to proceed.  Participating parties in this visit include: Myself and patient  The patient is: Patient Location: Home I am: Provider Location: Office/Clinic Subjective:    CC and HPI: [name] is a 61 y.o. year old female presenting for new evaluation and treatment of sinusitis. Patient reports the following: [name] endorses a sinus headache with sinus pain and pressure in the frontal and maxillary sinuses for the past 4 weeks. She has been trying to treat it with over the counter medications and supportive care, but she tells me the symptoms will improve some then worsen again. She endorses a fever over the weekend and she started to experience some shortness of breath with exertion. She denies any severe symptoms, but is concerned that bacterial infection has set in.    Past medical history, Surgical history, Family history not pertinant except as noted below, Social history, Allergies, and medications have been entered into the medical record, reviewed, and corrections made.   Review of Systems:  All review of systems negative except what is listed in the HPI  Objective:    Alert and oriented x 4 Audible congestion with inflammation around the eyes  Speaking in clear sentences with no shortness of breath. No distress.  Impression and Recommendations:    Problem List Items Addressed This Visit     Subacute pansinusitis - Primary    Symptoms and presentation consistent with bacterial  sinusitis. We will plan to start antibiotic therapy today for management given that supportive treatment has not been effective. Recommendations for continued OTC treatment discussed and provided on AVS. Will send diflucan in the even yeast symptoms develop after antibiotic use. Patient aware to follow-up if her symptoms fail to improve. Recommend starting antibiotic therapy as soon as possible to get medication in her system.      Relevant Medications   amoxicillin-clavulanate (AUGMENTIN) 875-125 MG tablet   fluconazole (DIFLUCAN) 150 MG tablet    orders and follow up as documented in EMR I discussed the assessment and treatment plan with the patient. The patient was provided an opportunity to ask questions and all were answered. The patient agreed with the plan and demonstrated an understanding of the instructions.   The patient was advised to call back or seek an in-person evaluation if the symptoms worsen or if the condition fails to improve as anticipated.  Follow-Up: prn  I provided 16 minutes of non-face-to-face interaction with this non face-to-face encounter including intake, same-day documentation, and chart review.   Orma Render, NP , DNP, AGNP-c [place] at [facility] [phone] (fax)

## 2022-02-28 ENCOUNTER — Encounter: Payer: BC Managed Care – PPO | Admitting: Registered Nurse

## 2022-03-01 ENCOUNTER — Encounter: Payer: BC Managed Care – PPO | Attending: Registered Nurse | Admitting: Registered Nurse

## 2022-03-01 ENCOUNTER — Encounter: Payer: Self-pay | Admitting: Registered Nurse

## 2022-03-01 VITALS — BP 130/75 | HR 94 | Ht 63.0 in | Wt 240.0 lb

## 2022-03-01 DIAGNOSIS — G894 Chronic pain syndrome: Secondary | ICD-10-CM | POA: Diagnosis present

## 2022-03-01 DIAGNOSIS — M7062 Trochanteric bursitis, left hip: Secondary | ICD-10-CM | POA: Diagnosis present

## 2022-03-01 DIAGNOSIS — M5416 Radiculopathy, lumbar region: Secondary | ICD-10-CM | POA: Diagnosis present

## 2022-03-01 DIAGNOSIS — Z5181 Encounter for therapeutic drug level monitoring: Secondary | ICD-10-CM

## 2022-03-01 DIAGNOSIS — Z79891 Long term (current) use of opiate analgesic: Secondary | ICD-10-CM | POA: Diagnosis present

## 2022-03-01 DIAGNOSIS — M7061 Trochanteric bursitis, right hip: Secondary | ICD-10-CM | POA: Diagnosis present

## 2022-03-01 MED ORDER — OXYCODONE-ACETAMINOPHEN 7.5-325 MG PO TABS: 1.0000 | ORAL_TABLET | Freq: Four times a day (QID) | ORAL | 0 refills | Status: DC | PRN

## 2022-03-01 NOTE — Progress Notes (Signed)
Subjective:    Patient ID: [name], female    DOB: 11-01-1960, 61 y.o.   MRN: [id number]  HPI: [name] is a 61 y.o. female who returns for follow up appointment for chronic pain and medication refill. She states her pain is located in her lower back radiating into her left lower extremity. She rates her pain 8. Her current exercise regime is walking and performing stretching exercises.  [name] Morphine equivalent is 45.00 MME.   Last UDS was Performed on 10/04/2021, it was consistent.    Pain Inventory Average Pain 7 Pain Right Now 8 My pain is sharp, dull, and aching  In the last 24 hours, has pain interfered with the following? General activity 2 Relation with others 0 Enjoyment of life 0 What TIME of day is your pain at its worst? daytime Sleep (in general) Good  Pain is worse with: walking, bending, and some activites Pain improves with: rest, heat/ice, and medication Relief from Meds: 8  Family History  Problem Relation Age of Onset   Hypertension Mother    Alzheimer's disease Mother    Diabetes Father    Breast cancer Sister 39   Anesthesia problems Neg Hx    Hypotension Neg Hx    Malignant hyperthermia Neg Hx    Pseudochol deficiency Neg Hx    Colon cancer Neg Hx    Esophageal cancer Neg Hx    Stomach cancer Neg Hx    Rectal cancer Neg Hx    Thyroid disease Neg Hx    Social History   Socioeconomic History   Marital status: Divorced    Spouse name: Not on file   Number of children: 0   Years of education: Not on file   Highest education level: Not on file  Occupational History   Occupation: TEACHER ASSISTANT  Tobacco Use   Smoking status: Never   Smokeless tobacco: Never  Vaping Use   Vaping Use: Never used  Substance and Sexual Activity   Alcohol use: No   Drug use: No   Sexual activity: Yes  Other Topics Concern   Not on file  Social History Narrative   Not on file   Social Determinants of Health   Financial  Resource Strain: Not on file  Food Insecurity: Food Insecurity Present (09/03/2019)   Hunger Vital Sign    Worried About Running Out of Food in the Last Year: Sometimes true    Ran Out of Food in the Last Year: Sometimes true  Transportation Needs: No Transportation Needs (09/03/2019)   PRAPARE - Hydrologist (Medical): No    Lack of Transportation (Non-Medical): No  Physical Activity: Not on file  Stress: Not on file  Social Connections: Not on file   Past Surgical History:  Procedure Laterality Date   ANAL EXAMINATION UNDER ANESTHESIA  02/21/11   anal fistula   BREAST SURGERY  patient does not remember date of procedure   pull fluid off lft br   CHOLECYSTECTOMY N/A 09/12/2018   Procedure: LAPAROSCOPIC CHOLECYSTECTOMY;  Surgeon: Stark Klein, MD;  Location: [place];  Service: General;  Laterality: N/A;   ELECTROPHYSIOLOGIC STUDY N/A 05/05/2015   Procedure: SVT Ablation;  Surgeon: Will Meredith Leeds, MD;  Location: [place] [facility];  Service: Cardiovascular;  Laterality: N/A;   EP IMPLANTABLE DEVICE N/A 01/30/2016   Procedure: Loop Recorder Insertion;  Surgeon: Sanda Klein, MD;  Location: [place] [facility];  Service: Cardiovascular;  Laterality: N/A;  INCISE AND DRAIN ABCESS     abscess on right thigh and buttock   KNEE ARTHROSCOPY     left   LAPAROSCOPIC APPENDECTOMY N/A 09/19/2018   Procedure: APPENDECTOMY LAPAROSCOPIC;  Surgeon: Clovis Riley, MD;  Location: [place] [facility];  Service: General;  Laterality: N/A;   SHOULDER SURGERY  04/14/09   right   Past Surgical History:  Procedure Laterality Date   ANAL EXAMINATION UNDER ANESTHESIA  02/21/11   anal fistula   BREAST SURGERY  patient does not remember date of procedure   pull fluid off lft br   CHOLECYSTECTOMY N/A 09/12/2018   Procedure: LAPAROSCOPIC CHOLECYSTECTOMY;  Surgeon: Stark Klein, MD;  Location: [place];  Service: General;  Laterality: N/A;   ELECTROPHYSIOLOGIC STUDY N/A 05/05/2015   Procedure:  SVT Ablation;  Surgeon: Will Meredith Leeds, MD;  Location: [place] [facility];  Service: Cardiovascular;  Laterality: N/A;   EP IMPLANTABLE DEVICE N/A 01/30/2016   Procedure: Loop Recorder Insertion;  Surgeon: Sanda Klein, MD;  Location: [place] [facility];  Service: Cardiovascular;  Laterality: N/A;   INCISE AND DRAIN ABCESS     abscess on right thigh and buttock   KNEE ARTHROSCOPY     left   LAPAROSCOPIC APPENDECTOMY N/A 09/19/2018   Procedure: APPENDECTOMY LAPAROSCOPIC;  Surgeon: Clovis Riley, MD;  Location: [place];  Service: General;  Laterality: N/A;   SHOULDER SURGERY  04/14/09   right   Past Medical History:  Diagnosis Date   Abscess    increased drainage from abscess on buttock   Anal fistula    Anxiety    Bilateral hip pain 05/27/2015   Chronic pain syndrome 05/27/2015   Depression    sees [name]   Diabetes mellitus without complication [facility])    Diverticulosis    Elevated alkaline phosphatase level    Fatty liver    Gallstones    Hiatal hernia    Hyperlipidemia    Hypertension    Internal hemorrhoids    Sleep apnea    2008- sleep study, neg. for sleep apnea    Stroke ([place])    SVT (supraventricular tachycardia)    Symptomatic cholelithiasis 09/11/2018   BP 130/75   Pulse 94   Ht '5\' 3"'$  (1.6 m)   Wt 240 lb (108.9 kg)   LMP 02/09/2017   SpO2 100%   BMI 42.51 kg/m   Opioid Risk Score:   Fall Risk Score:  `1  Depression screen PHQ 2/9     03/01/2022    3:10 PM 10/04/2021    3:08 PM 05/11/2021   12:11 PM 12/28/2020    3:18 PM 11/21/2020    3:28 PM 09/02/2020   10:00 AM 06/23/2020    3:33 PM  Depression screen PHQ 2/9  Decreased Interest 0 0 0 0 0 0 0  Down, Depressed, Hopeless 0 0 0 0 0 0 0  PHQ - 2 Score 0 0 0 0 0 0 0      Review of Systems  Musculoskeletal:  Positive for back pain.       B/L hip pain B/L leg pain   All other systems reviewed and are negative.     Objective:   Physical Exam Vitals and nursing note reviewed.   Constitutional:      Appearance: Normal appearance.  Cardiovascular:     Rate and Rhythm: Normal rate and regular rhythm.     Pulses: Normal pulses.     Heart sounds: Normal heart sounds.  Pulmonary:  Effort: Pulmonary effort is normal.     Breath sounds: Normal breath sounds.  Musculoskeletal:     Cervical back: Normal range of motion and neck supple.     Comments: Normal Muscle Bulk and Muscle Testing Reveals:  Upper Extremities: Full ROM and Muscle Strength 5/5  Lumbar Paraspinal Tenderness: L-4-L-5 Lower Extremities: Full ROM and Muscle Strength 5/5 Arises from Table with ease Narrow Based  Gait     Skin:    General: Skin is warm and dry.  Neurological:     Mental Status: She is alert and oriented to person, place, and time.  Psychiatric:        Mood and Affect: Mood normal.        Behavior: Behavior normal.         Assessment & Plan:  1.Chronic Bilateral Hip pain L>R---endstage OA of hips..Continue HEP as tolerated and Heat and Ice Therapy. Refilled:  Hydrocodone 10/325 one tablet 5 times a day as needed for moderate pain #150. 03/01/2022 We will continue the opioid monitoring program, this consists of regular clinic visits, examinations, urine drug screen, pill counts as well as use of [place] Controlled Substance Reporting system. A 12 month History has been reviewed on the [place] Controlled Substance Reporting System on 03/01/2022 2. Lumbar Radiculitis:  Continue medication regimen with Pamelor. Continue Cymbalta and Continue  HEP as Tolerated.  Continue to Monitor. 03/01/2022.          -              3. Morbid obesity: Continue with healthy diet Regime and HEP. Encouraged to continue with healthy diet regime and HEP. 03/01/2022 4.Bilateral  Greater Trochanteric Bursitis:.Continue with Heat and Ice Therapy. Continue to Monitor. 03/01/2022. 5. Left Knee Pain: No complaints Today.Continue with HEP as Tolerated. Continue to Monitor. 03/01/2022 6. Muscle  Spasm: Continue  Methocarbamol. Continue to monitor. 12/21 /2023.    F/U in 2 months

## 2022-03-02 ENCOUNTER — Encounter: Payer: Self-pay | Admitting: Registered Nurse

## 2022-03-09 ENCOUNTER — Other Ambulatory Visit: Payer: Self-pay | Admitting: Internal Medicine

## 2022-03-09 ENCOUNTER — Other Ambulatory Visit: Payer: Self-pay | Admitting: Medical

## 2022-03-14 ENCOUNTER — Other Ambulatory Visit: Payer: Self-pay | Admitting: Internal Medicine

## 2022-03-14 DIAGNOSIS — Z794 Long term (current) use of insulin: Secondary | ICD-10-CM

## 2022-03-27 ENCOUNTER — Other Ambulatory Visit: Payer: Self-pay | Admitting: Registered Nurse

## 2022-03-27 MED ORDER — NORTRIPTYLINE HCL 75 MG PO CAPS: 75.0000 mg | ORAL_CAPSULE | Freq: Every day | ORAL | 1 refills | Status: DC

## 2022-04-17 ENCOUNTER — Other Ambulatory Visit: Payer: Self-pay | Admitting: Medical

## 2022-04-17 DIAGNOSIS — Z87898 Personal history of other specified conditions: Secondary | ICD-10-CM

## 2022-04-17 DIAGNOSIS — I4719 Other supraventricular tachycardia: Secondary | ICD-10-CM

## 2022-04-17 NOTE — Telephone Encounter (Signed)
Has upcoming appt next month 

## 2022-05-10 ENCOUNTER — Encounter: Payer: BC Managed Care – PPO | Admitting: Registered Nurse

## 2022-05-11 ENCOUNTER — Encounter: Payer: BC Managed Care – PPO | Attending: Registered Nurse | Admitting: Registered Nurse

## 2022-05-11 ENCOUNTER — Encounter: Payer: Self-pay | Admitting: Registered Nurse

## 2022-05-11 VITALS — BP 135/76 | HR 83 | Ht 63.0 in | Wt 238.0 lb

## 2022-05-11 DIAGNOSIS — M7061 Trochanteric bursitis, right hip: Secondary | ICD-10-CM | POA: Insufficient documentation

## 2022-05-11 DIAGNOSIS — M5416 Radiculopathy, lumbar region: Secondary | ICD-10-CM | POA: Diagnosis not present

## 2022-05-11 DIAGNOSIS — M79605 Pain in left leg: Secondary | ICD-10-CM | POA: Insufficient documentation

## 2022-05-11 DIAGNOSIS — M79604 Pain in right leg: Secondary | ICD-10-CM | POA: Insufficient documentation

## 2022-05-11 DIAGNOSIS — Z79891 Long term (current) use of opiate analgesic: Secondary | ICD-10-CM | POA: Insufficient documentation

## 2022-05-11 DIAGNOSIS — G894 Chronic pain syndrome: Secondary | ICD-10-CM | POA: Insufficient documentation

## 2022-05-11 DIAGNOSIS — G8929 Other chronic pain: Secondary | ICD-10-CM | POA: Diagnosis present

## 2022-05-11 DIAGNOSIS — Z5181 Encounter for therapeutic drug level monitoring: Secondary | ICD-10-CM | POA: Diagnosis not present

## 2022-05-11 DIAGNOSIS — M7062 Trochanteric bursitis, left hip: Secondary | ICD-10-CM | POA: Diagnosis present

## 2022-05-11 MED ORDER — OXYCODONE-ACETAMINOPHEN 7.5-325 MG PO TABS: 1.0000 | ORAL_TABLET | Freq: Four times a day (QID) | ORAL | 0 refills | Status: DC | PRN

## 2022-05-11 NOTE — Progress Notes (Unsigned)
Subjective:    Patient ID: [name], female    DOB: 1960-08-30, 62 y.o.   MRN: [id number]  HPI: [name] is a 62 y.o. female who returns for follow up appointment for chronic pain and medication refill. states *** pain is located in  ***. rates pain ***. current exercise regime is walking and performing stretching exercises.  Ms. Wickenhauser Morphine equivalent is *** MME.   UDS ordered today.     Pain Inventory Average Pain 7 Pain Right Now 6 My pain is sharp, dull, and stabbing  In the last 24 hours, has pain interfered with the following? General activity 8 Relation with others 4 Enjoyment of life 6 What TIME of day is your pain at its worst? daytime and evening Sleep (in general) Poor  Pain is worse with: walking, bending, standing, and some activites Pain improves with: rest, heat/ice, and therapy/exercise Relief from Meds: 7  Family History  Problem Relation Age of Onset   Hypertension Mother    Alzheimer's disease Mother    Diabetes Father    Breast cancer Sister 42   Anesthesia problems Neg Hx    Hypotension Neg Hx    Malignant hyperthermia Neg Hx    Pseudochol deficiency Neg Hx    Colon cancer Neg Hx    Esophageal cancer Neg Hx    Stomach cancer Neg Hx    Rectal cancer Neg Hx    Thyroid disease Neg Hx    Social History   Socioeconomic History   Marital status: Divorced    Spouse name: Not on file   Number of children: 0   Years of education: Not on file   Highest education level: Not on file  Occupational History   Occupation: TEACHER ASSISTANT  Tobacco Use   Smoking status: Never   Smokeless tobacco: Never  Vaping Use   Vaping Use: Never used  Substance and Sexual Activity   Alcohol use: No   Drug use: No   Sexual activity: Yes  Other Topics Concern   Not on file  Social History Narrative   Not on file   Social Determinants of Health   Financial Resource Strain: Not on file  Food Insecurity: Food Insecurity Present  (09/03/2019)   Hunger Vital Sign    Worried About Running Out of Food in the Last Year: Sometimes true    Ran Out of Food in the Last Year: Sometimes true  Transportation Needs: No Transportation Needs (09/03/2019)   PRAPARE - Hydrologist (Medical): No    Lack of Transportation (Non-Medical): No  Physical Activity: Not on file  Stress: Not on file  Social Connections: Not on file   Past Surgical History:  Procedure Laterality Date   ANAL EXAMINATION UNDER ANESTHESIA  02/21/11   anal fistula   BREAST SURGERY  patient does not remember date of procedure   pull fluid off lft br   CHOLECYSTECTOMY N/A 09/12/2018   Procedure: LAPAROSCOPIC CHOLECYSTECTOMY;  Surgeon: Stark Klein, MD;  Location: [facility];  Service: General;  Laterality: N/A;   ELECTROPHYSIOLOGIC STUDY N/A 05/05/2015   Procedure: SVT Ablation;  Surgeon: Will Meredith Leeds, MD;  Location: [facility];  Service: Cardiovascular;  Laterality: N/A;   EP IMPLANTABLE DEVICE N/A 01/30/2016   Procedure: Loop Recorder Insertion;  Surgeon: Sanda Klein, MD;  Location: [facility];  Service: Cardiovascular;  Laterality: N/A;   INCISE AND DRAIN ABCESS     abscess on right thigh and  buttock   KNEE ARTHROSCOPY     left   LAPAROSCOPIC APPENDECTOMY N/A 09/19/2018   Procedure: APPENDECTOMY LAPAROSCOPIC;  Surgeon: Clovis Riley, MD;  Location: [facility];  Service: General;  Laterality: N/A;   SHOULDER SURGERY  04/14/09   right   Past Surgical History:  Procedure Laterality Date   ANAL EXAMINATION UNDER ANESTHESIA  02/21/11   anal fistula   BREAST SURGERY  patient does not remember date of procedure   pull fluid off lft br   CHOLECYSTECTOMY N/A 09/12/2018   Procedure: LAPAROSCOPIC CHOLECYSTECTOMY;  Surgeon: Stark Klein, MD;  Location: [facility];  Service: General;  Laterality: N/A;   ELECTROPHYSIOLOGIC STUDY N/A 05/05/2015   Procedure: SVT Ablation;  Surgeon: Will Meredith Leeds, MD;  Location: [facility];  Service: Cardiovascular;  Laterality: N/A;   EP IMPLANTABLE DEVICE N/A 01/30/2016   Procedure: Loop Recorder Insertion;  Surgeon: Sanda Klein, MD;  Location: [facility];  Service: Cardiovascular;  Laterality: N/A;   INCISE AND DRAIN ABCESS     abscess on right thigh and buttock   KNEE ARTHROSCOPY     left   LAPAROSCOPIC APPENDECTOMY N/A 09/19/2018   Procedure: APPENDECTOMY LAPAROSCOPIC;  Surgeon: Clovis Riley, MD;  Location: [facility];  Service: General;  Laterality: N/A;   SHOULDER SURGERY  04/14/09   right   Past Medical History:  Diagnosis Date   Abscess    increased drainage from abscess on buttock   Anal fistula    Anxiety    Bilateral hip pain 05/27/2015   Chronic pain syndrome 05/27/2015   Depression    sees [name]   Diabetes mellitus without complication [facility])    Diverticulosis    Elevated alkaline phosphatase level    Fatty liver    Gallstones    Hiatal hernia    Hyperlipidemia    Hypertension    Internal hemorrhoids    Sleep apnea    2008- sleep study, neg. for sleep apnea    Stroke ([place])    SVT (supraventricular tachycardia)    Symptomatic cholelithiasis 09/11/2018   BP (!) 144/81   Pulse 85   Ht '5\' 3"'$  (1.6 m)   Wt 238 lb (108 kg)   LMP 02/09/2017   SpO2 97%   BMI 42.16 kg/m   Opioid Risk Score:   Fall Risk Score:  `1  Depression screen [facility] 2/9     05/11/2022    3:18 PM 03/01/2022    3:10 PM 10/04/2021    3:08 PM 05/11/2021   12:11 PM 12/28/2020    3:18 PM 11/21/2020    3:28 PM 09/02/2020   10:00 AM  Depression screen PHQ 2/9  Decreased Interest 0 0 0 0 0 0 0  Down, Depressed, Hopeless 0 0 0 0 0 0 0  PHQ - 2 Score 0 0 0 0 0 0 0     Review of Systems  Musculoskeletal:  Positive for back pain.       B/L hips and legs  All other systems reviewed and are negative.      Objective:   Physical Exam        Assessment & Plan:  1.Chronic Bilateral Hip pain L>R---endstage OA of hips..Continue HEP as tolerated and Heat and  Ice Therapy. Refilled:  Hydrocodone 10/325 one tablet 5 times a day as needed for moderate pain #150. 03/01/2022 We will continue the opioid monitoring program, this consists of regular clinic visits, examinations, urine drug screen, pill counts  as well as use of [place] Controlled Substance Reporting system. A 12 month History has been reviewed on the [place] Controlled Substance Reporting System on 03/01/2022 2. Lumbar Radiculitis:  Continue medication regimen with Pamelor. Continue Cymbalta and Continue  HEP as Tolerated.  Continue to Monitor. 03/01/2022.          -              3. Morbid obesity: Continue with healthy diet Regime and HEP. Encouraged to continue with healthy diet regime and HEP. 03/01/2022 4.Bilateral  Greater Trochanteric Bursitis:.Continue with Heat and Ice Therapy. Continue to Monitor. 03/01/2022. 5. Left Knee Pain: No complaints Today.Continue with HEP as Tolerated. Continue to Monitor. 03/01/2022 6. Muscle Spasm: Continue  Methocarbamol. Continue to monitor. 12/21 /2023.

## 2022-05-14 ENCOUNTER — Encounter: Payer: BC Managed Care – PPO | Admitting: Medical

## 2022-05-15 LAB — TOXASSURE SELECT,+ANTIDEPR,UR

## 2022-05-16 ENCOUNTER — Telehealth: Payer: Self-pay | Admitting: *Deleted

## 2022-05-16 NOTE — Telephone Encounter (Signed)
Urine drug screen for this encounter is consistent for prescribed medication 

## 2022-05-19 ENCOUNTER — Other Ambulatory Visit: Payer: Self-pay | Admitting: Medical

## 2022-05-22 ENCOUNTER — Other Ambulatory Visit: Payer: Self-pay | Admitting: Internal Medicine

## 2022-06-09 ENCOUNTER — Other Ambulatory Visit: Payer: Self-pay | Admitting: Medical

## 2022-06-09 ENCOUNTER — Other Ambulatory Visit: Payer: Self-pay | Admitting: Internal Medicine

## 2022-06-17 ENCOUNTER — Other Ambulatory Visit: Payer: Self-pay | Admitting: Medical

## 2022-06-18 ENCOUNTER — Other Ambulatory Visit: Payer: Self-pay | Admitting: Internal Medicine

## 2022-06-18 DIAGNOSIS — Z794 Long term (current) use of insulin: Secondary | ICD-10-CM

## 2022-06-18 NOTE — Telephone Encounter (Signed)
Left message for pt to call back to schedule a visit. I will refill 30 days

## 2022-06-19 ENCOUNTER — Encounter: Payer: BC Managed Care – PPO | Admitting: Medical

## 2022-07-06 ENCOUNTER — Encounter: Payer: Self-pay | Admitting: Medical

## 2022-07-06 ENCOUNTER — Telehealth (INDEPENDENT_AMBULATORY_CARE_PROVIDER_SITE_OTHER): Payer: BC Managed Care – PPO | Admitting: Medical

## 2022-07-06 VITALS — Ht 63.0 in | Wt 237.0 lb

## 2022-07-06 DIAGNOSIS — J329 Chronic sinusitis, unspecified: Secondary | ICD-10-CM

## 2022-07-06 DIAGNOSIS — R051 Acute cough: Secondary | ICD-10-CM | POA: Diagnosis not present

## 2022-07-06 DIAGNOSIS — J40 Bronchitis, not specified as acute or chronic: Secondary | ICD-10-CM

## 2022-07-06 MED ORDER — BUDESONIDE-FORMOTEROL FUMARATE 160-4.5 MCG/ACT IN AERO: 2.0000 | INHALATION_SPRAY | Freq: Two times a day (BID) | RESPIRATORY_TRACT | 5 refills | Status: DC

## 2022-07-06 MED ORDER — AZITHROMYCIN 250 MG PO TABS: ORAL_TABLET | ORAL | 0 refills | Status: DC

## 2022-07-06 MED ORDER — ALBUTEROL SULFATE HFA 108 (90 BASE) MCG/ACT IN AERS: 2.0000 | INHALATION_SPRAY | Freq: Four times a day (QID) | RESPIRATORY_TRACT | 1 refills | Status: DC | PRN

## 2022-07-06 NOTE — Progress Notes (Signed)
Subjective:     Patient ID: [name], female   DOB: [birth year], 62 y.o.   MRN: [id number]  This visit type was conducted due to national recommendations for restrictions regarding the COVID-19 Pandemic (e.g. social distancing) in an effort to limit this patient's exposure and mitigate transmission in our community.  Due to their co-morbid illnesses, this patient is at least at moderate risk for complications without adequate follow up.  This format is felt to be most appropriate for this patient at this time.    Documentation for virtual audio and video telecommunications through [place] encounter:  The patient was located at home. The provider was located in the office. The patient did consent to this visit and is aware of possible charges through their insurance for this visit.  The other persons participating in this telemedicine service were none. Time spent on call was 20 minutes and in review of previous records 20 minutes total.  This virtual service is not related to other E/M service within previous 7 days.   HPI Chief Complaint  Patient presents with   Cough    VIRTUAL coughing, sinus pressure and HA. Had a fever last Friday. Symptoms started 2 weeks ago. Has very thick yellow mucus, sometimes productive. Two home covid tests last Friday and this Wed both negative. Took RSV vaccine 06/21/22.    She notes possible chest infection.  Started as sinus infection a week ago, but now moving into chest as well.  Much more cough and rattly chest last few days.  She notes almost 2 weeks of symptoms.  She notes sinus pressure, cough, headache, thick yellow green nasal discharge, sore throat.  Some body aches.  Some sob.  Had 2 negative covid tests.  Using mucinex DM.  No sick contacts.   No other aggravating or relieving factors. No other complaint.  Past Medical History:  Diagnosis Date   Abscess    increased drainage from abscess on buttock   Anal fistula    Anxiety     Bilateral hip pain 05/27/2015   Chronic pain syndrome 05/27/2015   Depression    sees Dr. Sherlyn Lick   Diabetes mellitus without complication [facility])    Diverticulosis    Elevated alkaline phosphatase level    Fatty liver    Gallstones    Hiatal hernia    Hyperlipidemia    Hypertension    Internal hemorrhoids    Sleep apnea    2008- sleep study, neg. for sleep apnea    Stroke [facility])    SVT (supraventricular tachycardia)    Symptomatic cholelithiasis 09/11/2018   Current Outpatient Medications on File Prior to Visit  Medication Sig Dispense Refill   aspirin 81 MG EC tablet Take 1 tablet (81 mg total) by mouth daily. 90 tablet 3   calcium carbonate (CALCIUM 600) 600 MG TABS tablet Take 1 tablet (600 mg total) by mouth 2 (two) times daily with a meal. 180 tablet 3   dexlansoprazole (DEXILANT) 60 MG capsule TAKE 1  BY MOUTH ONCE DAILY 30 capsule 2   dextromethorphan-guaiFENesin (MUCINEX DM) 30-600 MG 12hr tablet Take 1 tablet by mouth 2 (two) times daily.     DULoxetine (CYMBALTA) 60 MG capsule Take 1 capsule by mouth twice daily 180 capsule 0   fenofibrate (TRICOR) 145 MG tablet Take 1 tablet (145 mg total) by mouth daily. 90 tablet 3   ferrous gluconate (FERGON) 324 MG tablet Take 1 tablet by mouth once daily with breakfast 90 tablet 1  FIASP FLEXTOUCH 100 UNIT/ML FlexTouch Pen Inject 36 Units into the skin with breakfast, with lunch, and with evening meal. 105 mL 1   furosemide (LASIX) 80 MG tablet Take 1 tablet by mouth once daily 90 tablet 0   hydrOXYzine (ATARAX) 10 MG tablet Take 1 tablet (10 mg total) by mouth 2 (two) times daily as needed. 30 tablet 0   Insulin Pen Needle (PEN NEEDLES) 32G X 6 MM MISC Use as instructed to inject insulin in the morning, at noon, in the evening, and at bedtime 400 each 3   JARDIANCE 10 MG TABS tablet TAKE 1 TABLET BY MOUTH ONCE DAILY BEFORE BREAKFAST 90 tablet 0   metFORMIN (GLUCOPHAGE-XR) 750 MG 24 hr tablet Take 1 tablet (750 mg total) by mouth 2 (two)  times daily with a meal. 180 tablet 0   metoprolol succinate (TOPROL-XL) 25 MG 24 hr tablet Take 1 tablet by mouth once daily 30 tablet 0   nortriptyline (PAMELOR) 75 MG capsule Take 1 capsule (75 mg total) by mouth at bedtime. Please fill 90 day supply. 90 capsule 1   oxyCODONE-acetaminophen (PERCOCET) 7.5-325 MG tablet Take 1 tablet by mouth every 6 (six) hours as needed. 120 tablet 0   potassium chloride (KLOR-CON) 10 MEQ tablet Take 1 tablet by mouth twice daily 180 tablet 1   rosuvastatin (CRESTOR) 20 MG tablet Take 1 tablet (20 mg total) by mouth daily. 90 tablet 3   Simethicone 180 MG CAPS Take 1 capsule (180 mg total) by mouth 3 (three) times daily as needed. 100 capsule 2   [name] MAX SOLOSTAR 300 UNIT/ML Solostar Pen INJECT 96 UNITS INTO THE SKIN DAILY AT 6AM 30 mL 0   fluconazole (DIFLUCAN) 150 MG tablet Take one tablet by mouth at the first sign of symptoms of yeast. If no resolution, repeat dose in 72 hours. (Patient not taking: Reported on 07/06/2022) 2 tablet 2   naloxone (NARCAN) nasal spray 4 mg/0.1 mL  (Patient not taking: Reported on 07/06/2022)     No current facility-administered medications on file prior to visit.     Review of Systems As in subjective    Objective:   Physical Exam Due to coronavirus pandemic stay at home measures, patient visit was virtual and they were not examined in person.   Ht 5\' 3"  (1.6 m)   Wt 237 lb (107.5 kg)   LMP 02/09/2017   BMI 41.98 kg/m   Gen: wd, wn, nad Coughing some, no witnessed wheezing or labored breathing     Assessment:     Encounter Diagnoses  Name Primary?   Sinobronchitis Yes   Acute cough        Plan:     We discussed symptoms, concerns, timeframe of symptoms.  Begin medication as below Z-Pak and albuterol inhaler the next few days, continue Symbicort, rest, hydrate well, Tylenol as needed for aches or fever, continue Mucinex DM over-the-counter.  If not much improved within the next 3 to 4 days then call back.   If much worse over the weekend go to the emergency department  Kaylon was seen today for cough.  Diagnoses and all orders for this visit:  Sinobronchitis  Acute cough  Other orders -     budesonide-formoterol (SYMBICORT) 160-4.5 MCG/ACT inhaler; Inhale 2 puffs into the lungs 2 (two) times daily. -     albuterol (VENTOLIN HFA) 108 (90 Base) MCG/ACT inhaler; Inhale 2 puffs into the lungs every 6 (six) hours as needed for wheezing or shortness  of breath. -     azithromycin (ZITHROMAX) 250 MG tablet; 2 tablets day 1, then 1 tablet days 2-4  Follow-up as needed

## 2022-07-15 ENCOUNTER — Other Ambulatory Visit: Payer: Self-pay | Admitting: Medical

## 2022-07-16 ENCOUNTER — Ambulatory Visit (INDEPENDENT_AMBULATORY_CARE_PROVIDER_SITE_OTHER): Payer: BC Managed Care – PPO | Admitting: Internal Medicine

## 2022-07-16 ENCOUNTER — Encounter: Payer: BC Managed Care – PPO | Admitting: Registered Nurse

## 2022-07-16 ENCOUNTER — Encounter: Payer: Self-pay | Admitting: Internal Medicine

## 2022-07-16 VITALS — BP 120/74 | HR 72 | Ht 63.0 in | Wt 233.0 lb

## 2022-07-16 DIAGNOSIS — N1832 Chronic kidney disease, stage 3b: Secondary | ICD-10-CM

## 2022-07-16 DIAGNOSIS — Z794 Long term (current) use of insulin: Secondary | ICD-10-CM | POA: Diagnosis not present

## 2022-07-16 DIAGNOSIS — E1142 Type 2 diabetes mellitus with diabetic polyneuropathy: Secondary | ICD-10-CM

## 2022-07-16 DIAGNOSIS — E1122 Type 2 diabetes mellitus with diabetic chronic kidney disease: Secondary | ICD-10-CM

## 2022-07-16 DIAGNOSIS — E1165 Type 2 diabetes mellitus with hyperglycemia: Secondary | ICD-10-CM | POA: Diagnosis not present

## 2022-07-16 DIAGNOSIS — E1159 Type 2 diabetes mellitus with other circulatory complications: Secondary | ICD-10-CM

## 2022-07-16 DIAGNOSIS — Z7985 Long-term (current) use of injectable non-insulin antidiabetic drugs: Secondary | ICD-10-CM | POA: Diagnosis not present

## 2022-07-16 DIAGNOSIS — E042 Nontoxic multinodular goiter: Secondary | ICD-10-CM | POA: Diagnosis not present

## 2022-07-16 DIAGNOSIS — E785 Hyperlipidemia, unspecified: Secondary | ICD-10-CM

## 2022-07-16 LAB — POCT GLYCOSYLATED HEMOGLOBIN (HGB A1C): Hemoglobin A1C: 9.4 % — AB (ref 4.0–5.6)

## 2022-07-16 LAB — TSH: TSH: 0.72 u[IU]/mL (ref 0.35–5.50)

## 2022-07-16 LAB — BASIC METABOLIC PANEL
BUN: 20 mg/dL (ref 6–23)
CO2: 27 mEq/L (ref 19–32)
Calcium: 10.3 mg/dL (ref 8.4–10.5)
Chloride: 101 mEq/L (ref 96–112)
Creatinine, Ser: 1.73 mg/dL — ABNORMAL HIGH (ref 0.40–1.20)
GFR: 31.47 mL/min — ABNORMAL LOW (ref >60.00)
Glucose, Bld: 182 mg/dL — ABNORMAL HIGH (ref 70–99)
Potassium: 4.7 mEq/L (ref 3.5–5.1)
Sodium: 141 mEq/L (ref 135–145)

## 2022-07-16 LAB — LIPID PANEL
Cholesterol: 141 mg/dL (ref 0–200)
HDL: 38.5 mg/dL — ABNORMAL LOW (ref >39.00)
LDL Cholesterol: 73 mg/dL (ref 0–99)
NonHDL: 102.71
Total CHOL/HDL Ratio: 4
Triglycerides: 149 mg/dL (ref 0.0–149.0)
VLDL: 29.8 mg/dL (ref 0.0–40.0)

## 2022-07-16 LAB — MICROALBUMIN / CREATININE URINE RATIO
Creatinine,U: 31 mg/dL
Microalb Creat Ratio: 2.3 mg/g (ref 0.0–30.0)
Microalb, Ur: <0.7 mg/dL (ref 0.0–1.9)

## 2022-07-16 MED ORDER — FREESTYLE LIBRE 3 SENSOR MISC: 1.0000 | 3 refills | Status: DC

## 2022-07-16 NOTE — Patient Instructions (Addendum)
-   Stop Jardiance  - Start Mounjaro 2.5 mg once weekly  - Continue  Metformin 750 mg , 1 tablet Twice daily  - Decrease [name] 86 units once daily  - Continue  Fiasp 32 units with each meal     -Fiasp correctional insulin: ADD extra units on insulin to your meal-time Fiasp dose if your blood sugars are higher than 150. Use the scale below to help guide you:   Blood sugar before meal Number of units to inject  Less than 150 0 unit  151 -  170 1 units  171 -  190 2 units  191 -  210 3 units  211 -  230 4 units  231 -  250 5 units  251 -  270 6 units  271 -  290 7 units  291 -  310 8 units  311 - 330 9 units  331 - 350 10 units  351 - 370 11 units   371 - 390 12 units  391 - 410 13 units         HOW TO TREAT LOW BLOOD SUGARS (Blood sugar LESS THAN 70 MG/DL) Please follow the RULE OF 15 for the treatment of hypoglycemia treatment (when your (blood sugars are less than 70 mg/dL)   STEP 1: Take 15 grams of carbohydrates when your blood sugar is low, which includes:  3-4 GLUCOSE TABS  OR 3-4 OZ OF JUICE OR REGULAR SODA OR ONE TUBE OF GLUCOSE GEL    STEP 2: RECHECK blood sugar in 15 MINUTES STEP 3: If your blood sugar is still low at the 15 minute recheck --> then, go back to STEP 1 and treat AGAIN with another 15 grams of carbohydrates.

## 2022-07-16 NOTE — Progress Notes (Unsigned)
Name: [name]  Age/ Sex: 62 y.o., female   MRN/ DOB: [id number], 09-Jan-1961     PCP: Jac Canavan, PA-C   Reason for Endocrinology Evaluation: Type 2 Diabetes Mellitus  Initial Endocrine Consultative Visit: 07/06/2020    PATIENT IDENTIFIER: [name] is a 62 y.o. female with a past medical history of SVT, MNG, T2DM and Dyslipidemia . The patient has followed with Endocrinology clinic since 07/06/2020 for consultative assistance with management of her diabetes.  DIABETIC HISTORY:  Ms. Jama was diagnosed with DM in 2011 ,Ozempic- severe nausea . Insulin started in 2015. Metformin- no intolerance , jardiance- no intolerance, soliqua. Her hemoglobin A1c has ranged from 6.5%  in 2016, peaking at 13.6% in 2021  THYROID HISTROY : She was found to have hypermetabolic  MNG on PET scan in 2016 during evaluation of ovarian cyst. These results were confirmed with thyroid ultrasound in 2017. She is S/P FNA of the isthmic and right lobe nodule with scant cellularity in 2017. She was lost to follow up until 06/2020, repeat ultrasound showed heterogenous gland but no specific nodules  Reduced Metformin due to low GFR 01/2021  Aldo: renin normal 09/2020  Started Jardiance 09/2021   SUBJECTIVE:   During the last visit (09/20/2021): A1c 13.0%     Today (07/16/2022): [name] is here for a F/U on diabetes and MNG. She has NOT been to our clinic in 9 months.   She has not been checking glucose due to cost of sensors  and strips.     She follows with physical medicine and rehab for hip pain, lumbar radiculitis as well as left knee pain   She has had recurrent yeast infection on Jardiance   She has noted local neck swelling and occasional dysphagia    HOME DIABETES REGIMEN:  Metformin 750 mg , 1 tablet  daily Jardiance 10 mg daily [name]  96 units once daily- takes 92 u Fiasp 36 units TIDQAC- takes 32 u CF: Fiasp ( BG-130/20)    Statin: yes ACE-I/ARB:  no Prior Diabetic Education: yes    CONTINUOUS GLUCOSE MONITORING RECORD INTERPRETATION    Dates of Recording: 4/23 - 07/16/2022  Sensor description: [employer] 2  Results statistics:   CGM use % of time 70  Average and SD 206/45.7  Time in range      43%  % Time Above 180 20  % Time above 250 34  % Time Below target 3   Glycemic patterns summary: Patient has been noted with hypoglycemia at night, and  hyperglycemia during the day  Hyperglycemic episodes postprandial  Hypoglycemic episodes occurred at night  Overnight periods: Trends down    DIABETIC COMPLICATIONS: Microvascular complications:  Neuropathy Denies: CKD, retinopathy Last Eye Exam: Completed 08/2021  Macrovascular complications:  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 Sherlyn Lick   Diabetes mellitus without complication (HCC)    Diverticulosis    Elevated alkaline phosphatase level    Fatty liver    Gallstones    Hiatal hernia    Hyperlipidemia    Hypertension    Internal hemorrhoids    Sleep apnea    2008- sleep study, neg. for  sleep apnea    Stroke [facility])    SVT (supraventricular tachycardia)    Symptomatic cholelithiasis 09/11/2018   Past Surgical History:  Past Surgical History:  Procedure Laterality Date   ANAL EXAMINATION UNDER ANESTHESIA  02/21/11   anal fistula   BREAST SURGERY  patient does not remember date of procedure   pull fluid off lft br   CHOLECYSTECTOMY N/A 09/12/2018   Procedure: LAPAROSCOPIC CHOLECYSTECTOMY;  Surgeon: Almond Lint, MD;  [facility];  Service: General;  Laterality: N/A;   ELECTROPHYSIOLOGIC STUDY N/A 05/05/2015   Procedure: SVT Ablation;  Surgeon: Will Jorja Loa, MD;  [facility] INVASIVE  CV LAB;  Service: Cardiovascular;  Laterality: N/A;   EP IMPLANTABLE DEVICE N/A 01/30/2016   Procedure: Loop Recorder Insertion;  Surgeon: Thurmon Fair, MD;  [facility] INV[facility] CV LAB;  Service: Cardiovascular;  Laterality: N/A;   INCISE AND DRAIN ABCESS     abscess on right thigh and buttock   KNEE ARTHROSCOPY     left   LAPAROSCOPIC APPENDECTOMY N/A 09/19/2018   Procedure: APPENDECTOMY LAPAROSCOPIC;  Surgeon: Berna Bue, MD;  Location: [facility] OR;  Service: General;  Laterality: N/A;   SHOULDER SURGERY  04/14/09   right   Social History:  reports that she has never smoked. She has never used smokeless tobacco. She reports that she does not drink alcohol and does not use drugs. Family History:  Family History  Problem Relation Age of Onset   Hypertension Mother    Alzheimer's disease Mother    Diabetes Father    Breast cancer Sister 82   Anesthesia problems Neg Hx    Hypotension Neg Hx    Malignant hyperthermia Neg Hx    Pseudochol deficiency Neg Hx    Colon cancer Neg Hx    Esophageal cancer Neg Hx    Stomach cancer Neg Hx    Rectal cancer Neg Hx    Thyroid disease Neg Hx      HOME MEDICATIONS: Allergies as of 07/16/2022       Reactions   Jardiance [empagliflozin] Other (See Comments)   Recurrent yeast infections    Ozempic (0.25 Or 0.5 Mg-dose) [semaglutide(0.25 Or 0.5mg -dos)]    Severe nausea, bad gas   Tramadol Nausea Only        Medication List        Accurate as of Jul 16, 2022 11:54 AM. If you have any questions, ask your nurse or doctor.          STOP taking these medications    Jardiance 10 MG Tabs tablet Generic drug: empagliflozin Stopped by: Scarlette Shorts, MD       TAKE these medications    albuterol 108 (90 Base) MCG/ACT inhaler Commonly known as: VENTOLIN HFA Inhale 2 puffs into the lungs every 6 (six) hours as needed for wheezing or shortness of breath.   aspirin EC 81 MG tablet Take 1 tablet (81 mg total) by mouth  daily.   azithromycin 250 MG tablet Commonly known as: ZITHROMAX 2 tablets day 1, then 1 tablet days 2-4   budesonide-formoterol 160-4.5 MCG/ACT inhaler Commonly known as: Symbicort Inhale 2 puffs into the lungs 2 (two) times daily.   calcium carbonate 600 MG Tabs tablet Commonly known as: Calcium 600 Take 1 tablet (600 mg total) by mouth 2 (two) times daily with a meal.   dexlansoprazole 60 MG capsule Commonly known as: DEXILANT TAKE 1  BY MOUTH ONCE DAILY   dextromethorphan-guaiFENesin 30-600 MG 12hr tablet Commonly  known as: MUCINEX DM Take 1 tablet by mouth 2 (two) times daily.   DULoxetine 60 MG capsule Commonly known as: CYMBALTA Take 1 capsule by mouth twice daily   fenofibrate 145 MG tablet Commonly known as: TRICOR Take 1 tablet (145 mg total) by mouth daily.   ferrous gluconate 324 MG tablet Commonly known as: FERGON Take 1 tablet by mouth once daily with breakfast   Fiasp FlexTouch 100 UNIT/ML FlexTouch Pen Generic drug: insulin aspart Inject 36 Units into the skin with breakfast, with lunch, and with evening meal.   fluconazole 150 MG tablet Commonly known as: DIFLUCAN Take one tablet by mouth at the first sign of symptoms of yeast. If no resolution, repeat dose in 72 hours.   FreeStyle Libre 3 Sensor Misc 1 Device by Does not apply route every 14 (fourteen) days. Started by: Scarlette Shorts, MD   furosemide 80 MG tablet Commonly known as: LASIX Take 1 tablet by mouth once daily   hydrOXYzine 10 MG tablet Commonly known as: ATARAX Take 1 tablet (10 mg total) by mouth 2 (two) times daily as needed.   metFORMIN 750 MG 24 hr tablet Commonly known as: GLUCOPHAGE-XR Take 1 tablet (750 mg total) by mouth 2 (two) times daily with a meal.   metoprolol succinate 25 MG 24 hr tablet Commonly known as: TOPROL-XL Take 1 tablet by mouth once daily   naloxone 4 MG/0.1ML Liqd nasal spray kit Commonly known as: NARCAN   nortriptyline 75 MG  capsule Commonly known as: PAMELOR Take 1 capsule (75 mg total) by mouth at bedtime. Please fill 90 day supply.   oxyCODONE-acetaminophen 7.5-325 MG tablet Commonly known as: Percocet Take 1 tablet by mouth every 6 (six) hours as needed.   Pen Needles 32G X 6 MM Misc Use as instructed to inject insulin in the morning, at noon, in the evening, and at bedtime   potassium chloride 10 MEQ tablet Commonly known as: KLOR-CON Take 1 tablet by mouth twice daily   rosuvastatin 20 MG tablet Commonly known as: CRESTOR Take 1 tablet (20 mg total) by mouth daily.   Simethicone 180 MG Caps Take 1 capsule (180 mg total) by mouth 3 (three) times daily as needed.   [name] Max SoloStar 300 UNIT/ML Solostar Pen Generic drug: insulin glargine (2 Unit Dial) INJECT 96 UNITS INTO THE SKIN DAILY AT 6AM         OBJECTIVE:   Vital Signs: BP 120/74 (BP Location: Left Arm, Patient Position: Sitting, Cuff Size: Large)   Pulse 72   Ht 5\' 3"  (1.6 m)   Wt 233 lb (105.7 kg)   LMP 02/09/2017   SpO2 97%   BMI 41.27 kg/m   Wt Readings from Last 3 Encounters:  07/16/22 233 lb (105.7 kg)  07/06/22 237 lb (107.5 kg)  05/11/22 238 lb (108 kg)     Exam: General: Pt appears well and is in NAD Bilateral thyroid nodules were appreciated on today's exam  Lungs: Clear with good BS bilat  Heart: RRR   Extremities: Trace pretibial edema.   Neuro: MS is good with appropriate affect, pt is alert and Ox3      DM foot exam: 07/16/2022   The skin of the feet is  without sores or ulcerations. The pedal pulses are 1+ on right and 1+ on left. 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 No  discrete nodules are identifiable separate from the background thyroid parenchyma on today's examination. Previously biopsied nodules now blend in with the background thyroid parenchyma.   IMPRESSION: Slight interval enlargement of the overall thyroid gland which is diffusely heterogeneous consistent with diffuse goitrous replacement.   Previously biopsied nodules are now no longer evident and have blended in with the background thyroid parenchyma. No discrete nodules or new suspicious findings identified.   FNA isthmic nodule 1.8 cm  03/31/2015 Scant epithelium     FNA right lower nodule 4.8 cm 03/31/2015 Scant epithelium   ASSESSMENT / PLAN / RECOMMENDATIONS:   1) Type 2 Diabetes Mellitus, poorly controlled, With Neuropathic and macrovascular complications - Most recent A1c of 9.5 %. Goal A1c <7.0%.     - A1c down 13.0% to 9.5%  -Intolerant to Jardiance due to recurrent yeast infections -She had developed nausea with Ozempic, but agreed to try [facility] on a small dose, we did discuss the risk of GI side effects as well with [facility] -Patient continues with glycemic excursions ranging from 59 to as high as >350 mg/dL , patient does admit to imperfect adherence to prandial insulin -I have encouraged the patient and discussed the importance of taking Fiasp before each meal and using correction scale as needed, she continues to use suboptimal dosing than previously advised -Will decrease [name] as below, due to fasting hyperglycemia  MEDICATIONS: Stop  Jardiance 10 mg  Start Mounjaro 2.5 mg weekly Continue metformin 750 mg , 1 tablet BID Decrease [name] 86 units once daily Continue Fiasp 32 units TIDQAC Continue CF: Fiasp ( BG-130/20) TIDQAC  EDUCATION / INSTRUCTIONS: BG monitoring instructions: Patient is instructed to check her blood sugars 3 times a day, before meals. Call [place] [facility] if: BG persistently < 70  I reviewed the Rule of 15 for the treatment of  hypoglycemia in detail with the patient. Literature supplied.   2) Diabetic complications:  Eye: Does not have known diabetic retinopathy.  Neuro/ Feet: Does  have known diabetic peripheral neuropathy .  Renal: Patient does not have known baseline CKD. She   is  on an ACEI/ARB at present.    3) MNG:    -No local neck symptoms - She is S/P FNA of the isthmic and right nodule in 2017 with scan cellularity , she was lost to follow up until 07/2020 when a repeat Thyroid ultrasound showed increase thyroid heterogeneity but no actual thyroid nodules, previously biopsied nodules have blended in with a heterogeneous background - I have ordered a thyroid ultrasound but this has not been done yet, will re-order        4) Dyslipidemia :   -She is past due for labs, these were ordered for today  Medication   fenofibrate 145 mg daily  Rosuvastatin 20 mg daily      F/U in 4 months   Signed electronically by: Lyndle Herrlich, MD  [facility] Endocrinology  [facility] [address] [place], [place] [address] Phone: [phone] FAX: [phone]   CC: Jac Canavan, PA-C [address] [place] [address] Phone: [phone]  Fax: [phone]  Return to Endocrinology clinic as below: Future Appointments  Date Time Provider Department Center  07/16/2022  3:20 PM Jones Bales,  NP CPR[facility]PR[facility] CPR  09/05/2022  2:15 PM Genia Del PFM-PFM PFSM  01/02/2023  3:00 PM Jesselle Laflamme, Konrad Dolores, MD [facility] None

## 2022-07-16 NOTE — Telephone Encounter (Signed)
Has upcoming appointment in June

## 2022-07-17 ENCOUNTER — Encounter: Payer: BC Managed Care – PPO | Attending: Registered Nurse | Admitting: Registered Nurse

## 2022-07-17 ENCOUNTER — Telehealth: Payer: Self-pay | Admitting: Internal Medicine

## 2022-07-17 ENCOUNTER — Encounter: Payer: Self-pay | Admitting: Registered Nurse

## 2022-07-17 VITALS — BP 145/77 | HR 79 | Ht 63.0 in | Wt 241.0 lb

## 2022-07-17 DIAGNOSIS — G894 Chronic pain syndrome: Secondary | ICD-10-CM | POA: Diagnosis present

## 2022-07-17 DIAGNOSIS — Z5181 Encounter for therapeutic drug level monitoring: Secondary | ICD-10-CM

## 2022-07-17 DIAGNOSIS — M7061 Trochanteric bursitis, right hip: Secondary | ICD-10-CM | POA: Insufficient documentation

## 2022-07-17 DIAGNOSIS — M47817 Spondylosis without myelopathy or radiculopathy, lumbosacral region: Secondary | ICD-10-CM | POA: Diagnosis not present

## 2022-07-17 DIAGNOSIS — Z79891 Long term (current) use of opiate analgesic: Secondary | ICD-10-CM | POA: Diagnosis present

## 2022-07-17 DIAGNOSIS — M79605 Pain in left leg: Secondary | ICD-10-CM | POA: Diagnosis present

## 2022-07-17 DIAGNOSIS — M79604 Pain in right leg: Secondary | ICD-10-CM | POA: Diagnosis not present

## 2022-07-17 DIAGNOSIS — Z794 Long term (current) use of insulin: Secondary | ICD-10-CM | POA: Insufficient documentation

## 2022-07-17 DIAGNOSIS — G8929 Other chronic pain: Secondary | ICD-10-CM

## 2022-07-17 DIAGNOSIS — M7062 Trochanteric bursitis, left hip: Secondary | ICD-10-CM | POA: Diagnosis present

## 2022-07-17 DIAGNOSIS — M5416 Radiculopathy, lumbar region: Secondary | ICD-10-CM | POA: Diagnosis not present

## 2022-07-17 DIAGNOSIS — E1122 Type 2 diabetes mellitus with diabetic chronic kidney disease: Secondary | ICD-10-CM | POA: Insufficient documentation

## 2022-07-17 MED ORDER — LOSARTAN POTASSIUM 25 MG PO TABS
25.0000 mg | ORAL_TABLET | Freq: Every day | ORAL | 3 refills | Status: DC
Start: 2022-07-17 — End: 2023-07-05

## 2022-07-17 MED ORDER — FENOFIBRATE 145 MG PO TABS: 145.0000 mg | ORAL_TABLET | Freq: Every day | ORAL | 3 refills | Status: DC

## 2022-07-17 MED ORDER — OXYCODONE-ACETAMINOPHEN 7.5-325 MG PO TABS: 1.0000 | ORAL_TABLET | Freq: Four times a day (QID) | ORAL | 0 refills | Status: DC | PRN

## 2022-07-17 MED ORDER — ROSUVASTATIN CALCIUM 20 MG PO TABS: 20.0000 mg | ORAL_TABLET | Freq: Every day | ORAL | 3 refills | Status: DC

## 2022-07-17 MED ORDER — TIRZEPATIDE 2.5 MG/0.5ML ~~LOC~~ SOAJ: 2.5000 mg | SUBCUTANEOUS | 3 refills | Status: DC

## 2022-07-17 NOTE — Telephone Encounter (Signed)
Pt contacted the office to inquire about the [facility] rx. Wanted to know if you still wanted her to start medication. Pt was advised of lab results and verbalized understanding.

## 2022-07-17 NOTE — Telephone Encounter (Signed)
Patient advised and will pick up [facility]

## 2022-07-17 NOTE — Telephone Encounter (Signed)
Please let the patient know that unfortunately her kidney function has deteriorated over the past year and a half   She used to be on medications to protect her kidneys but I do not see them on her list anymore, please asked the patient to start taking losartan 25 mg daily.  A referral has been placed to a kidney specialist   It is important that she takes her diabetes medications appropriately to improve her glycemic control to prevent any further kidney damage   Also asked the patient to STOP metformin  Continue the rest of diabetes medications    Cholesterol looks great, continue rosuvastatin and fenofibrate    Thanks

## 2022-07-17 NOTE — Addendum Note (Signed)
Addended by: Scarlette Shorts on: 07/17/2022 09:26 AM   Modules accepted: Orders

## 2022-07-17 NOTE — Progress Notes (Addendum)
Subjective:    Patient ID: [name], female    DOB: Nov 09, 1960, 62 y.o.   MRN: [id number]  HPI: [name] is a 62 y.o. female who returns for follow up appointment for chronic pain and medication refill. She states her pain is located in her lower back radiating into her bilateral hips. She rates her pain 8. Her current exercise regime is walking and performing stretching exercises.  [name] arrived to office hypertensive, blood pressyre was rechecked. She is compliant with her anti-hypertensive medication.   [name] Morphine equivalent is 45.00 MME.   Last UDS was Performed on 05/11/2022, it was consistent.    Pain Inventory Average Pain 7 Pain Right Now 8 My pain is intermittent, sharp, dull, and stabbing  In the last 24 hours, has pain interfered with the following? General activity 7 Relation with others 0 Enjoyment of life 8 What TIME of day is your pain at its worst? morning  and night Sleep (in general) Fair  Pain is worse with: walking and standing Pain improves with: heat/ice and medication Relief from Meds: 8  Family History  Problem Relation Age of Onset   Hypertension Mother    Alzheimer's disease Mother    Diabetes Father    Breast cancer Sister 71   Anesthesia problems Neg Hx    Hypotension Neg Hx    Malignant hyperthermia Neg Hx    Pseudochol deficiency Neg Hx    Colon cancer Neg Hx    Esophageal cancer Neg Hx    Stomach cancer Neg Hx    Rectal cancer Neg Hx    Thyroid disease Neg Hx    Social History   Socioeconomic History   Marital status: Divorced    Spouse name: Not on file   Number of children: 0   Years of education: Not on file   Highest education level: Not on file  Occupational History   Occupation: TEACHER ASSISTANT  Tobacco Use   Smoking status: Never   Smokeless tobacco: Never  Vaping Use   Vaping Use: Never used  Substance and Sexual Activity   Alcohol use: No   Drug use: No   Sexual activity:  Yes  Other Topics Concern   Not on file  Social History Narrative   Not on file   Social Determinants of Health   Financial Resource Strain: Not on file  Food Insecurity: Food Insecurity Present (09/03/2019)   Hunger Vital Sign    Worried About Running Out of Food in the Last Year: Sometimes true    Ran Out of Food in the Last Year: Sometimes true  Transportation Needs: No Transportation Needs (09/03/2019)   PRAPARE - Administrator, Civil Service (Medical): No    Lack of Transportation (Non-Medical): No  Physical Activity: Not on file  Stress: Not on file  Social Connections: Not on file   Past Surgical History:  Procedure Laterality Date   ANAL EXAMINATION UNDER ANESTHESIA  02/21/11   anal fistula   BREAST SURGERY  patient does not remember date of procedure   pull fluid off lft br   CHOLECYSTECTOMY N/A 09/12/2018   Procedure: LAPAROSCOPIC CHOLECYSTECTOMY;  Surgeon: Almond Lint, MD;  [facility]: [facility] OR;  Service: General;  Laterality: N/A;   ELECTROPHYSIOLOGIC STUDY N/A 05/05/2015   Procedure: SVT Ablation;  Surgeon: Will Jorja Loa, MD;  [facility] INVASIVE CV LAB;  Service: Cardiovascular;  Laterality: N/A;   EP IMPLANTABLE DEVICE N/A 01/30/2016   Procedure: Loop Recorder Insertion;  Surgeon: Thurmon Fair, MD;  Location: [facility] INVASIVE CV LAB;  Service: Cardiovascular;  Laterality: N/A;   INCISE AND DRAIN ABCESS     abscess on right thigh and buttock   KNEE ARTHROSCOPY     left   LAPAROSCOPIC APPENDECTOMY N/A 09/19/2018   Procedure: APPENDECTOMY LAPAROSCOPIC;  Surgeon: Berna Bue, MD;  [facility]: [facility] OR;  Service: General;  Laterality: N/A;   SHOULDER SURGERY  04/14/09   right   Past Surgical History:  Procedure Laterality Date   ANAL EXAMINATION UNDER ANESTHESIA  02/21/11   anal fistula   BREAST SURGERY  patient does not remember date of procedure   pull fluid off lft br   CHOLECYSTECTOMY N/A 09/12/2018   Procedure: LAPAROSCOPIC CHOLECYSTECTOMY;   Surgeon: Almond Lint, MD;  [facility]: [facility] OR;  Service: General;  Laterality: N/A;   ELECTROPHYSIOLOGIC STUDY N/A 05/05/2015   Procedure: SVT Ablation;  Surgeon: Will Jorja Loa, MD;  [facility] CV LAB;  Service: Cardiovascular;  Laterality: N/A;   EP IMPLANTABLE DEVICE N/A 01/30/2016   Procedure: Loop Recorder Insertion;  Surgeon: Thurmon Fair, MD;  [facility] INV[facility] CV LAB;  Service: Cardiovascular;  Laterality: N/A;   INCISE AND DRAIN ABCESS     abscess on right thigh and buttock   KNEE ARTHROSCOPY     left   LAPAROSCOPIC APPENDECTOMY N/A 09/19/2018   Procedure: APPENDECTOMY LAPAROSCOPIC;  Surgeon: Berna Bue, MD;  [facility] OR;  Service: General;  Laterality: N/A;   SHOULDER SURGERY  04/14/09   right   Past Medical History:  Diagnosis Date   Abscess    increased drainage from abscess on buttock   Anal fistula    Anxiety    Bilateral hip pain 05/27/2015   Chronic pain syndrome 05/27/2015   Depression    sees Dr. Sherlyn Lick   Diabetes mellitus without complication [facility])    Diverticulosis    Elevated alkaline phosphatase level    Fatty liver    Gallstones    Hiatal hernia    Hyperlipidemia    Hypertension    Internal hemorrhoids    Sleep apnea    2008- sleep study, neg. for sleep apnea    Stroke (HCC)    SVT (supraventricular tachycardia)    Symptomatic cholelithiasis 09/11/2018   BP (!) 184/72   Pulse 86   Ht 5\' 3"  (1.6 m)   Wt 241 lb (109.3 kg)   LMP 02/09/2017   SpO2 98%   BMI 42.69 kg/m   Opioid Risk Score:   Fall Risk Score:  `1  Depression screen [facility] 2/9     05/11/2022    3:18 PM 03/01/2022    3:10 PM 10/04/2021    3:08 PM 05/11/2021   12:11 PM 12/28/2020    3:18 PM 11/21/2020    3:28 PM 09/02/2020   10:00 AM  Depression screen PHQ 2/9  Decreased Interest 0 0 0 0 0 0 0  Down, Depressed, Hopeless 0 0 0 0 0 0 0  PHQ - 2 Score 0 0 0 0 0 0 0      Review of Systems  All other systems reviewed and are negative.      Objective:    Physical Exam Vitals and nursing note reviewed.  Constitutional:      Appearance: Normal appearance.  Cardiovascular:     Rate and Rhythm: Normal rate and regular rhythm.     Pulses: Normal pulses.     Heart sounds: Normal heart sounds.  Pulmonary:  Effort: Pulmonary effort is normal.     Breath sounds: Normal breath sounds.  Musculoskeletal:     Cervical back: Normal range of motion and neck supple.     Comments: Normal Muscle Bulk and Muscle Testing Reveals:  Upper Extremities: Full ROM and Muscle Strength 5/5 Lumbar Paraspinal Tenderness: L-4-L-5 Lower Extremities: Full ROM and Muscle Strength 5/5 Arises from Table slowly Narrow Based  Gait     Skin:    General: Skin is warm and dry.  Neurological:     Mental Status: She is alert and oriented to person, place, and time.  Psychiatric:        Mood and Affect: Mood normal.        Behavior: Behavior normal.         Assessment & Plan:  1.Chronic Bilateral Hip pain L>R---endstage OA of hips..Continue HEP as tolerated and Heat and Ice Therapy. Refilled:  Hydrocodone 10/325 one tablet 5 times a day as needed for moderate pain #150. Second script sent for the following month.05/11/2022 We will continue the opioid monitoring program, this consists of regular clinic visits, examinations, urine drug screen, pill counts as well as use of [place] Controlled Substance Reporting system. A 12 month History has been reviewed on the [place] Controlled Substance Reporting System on 07/17/2022 2. Lumbar Radiculitis:  Continue medication regimen with Pamelor. Continue Cymbalta and Continue  HEP as Tolerated.  Continue to Monitor. 07/17/2022.          -              3. Morbid obesity: Continue with healthy diet Regime and HEP. Encouraged to continue with healthy diet regime and HEP. 07/17/2022 4.Bilateral  Greater Trochanteric Bursitis:.Continue with Heat and Ice Therapy. Continue to Monitor. 07/17/2022. 5. Left Knee Pain: No  complaints Today.Continue with HEP as Tolerated. Continue to Monitor. 07/17/2022 6. Muscle Spasm: Continue  Methocarbamol. Continue to monitor. 07/17/2022.

## 2022-07-18 ENCOUNTER — Other Ambulatory Visit: Payer: Self-pay | Admitting: Medical

## 2022-07-18 DIAGNOSIS — I4719 Other supraventricular tachycardia: Secondary | ICD-10-CM

## 2022-07-18 DIAGNOSIS — Z87898 Personal history of other specified conditions: Secondary | ICD-10-CM

## 2022-07-24 ENCOUNTER — Other Ambulatory Visit: Payer: Self-pay | Admitting: Internal Medicine

## 2022-07-28 ENCOUNTER — Other Ambulatory Visit: Payer: Self-pay | Admitting: Internal Medicine

## 2022-07-29 MED ORDER — TOUJEO MAX SOLOSTAR 300 UNIT/ML ~~LOC~~ SOPN: PEN_INJECTOR | SUBCUTANEOUS | 0 refills | Status: DC

## 2022-07-30 ENCOUNTER — Other Ambulatory Visit: Payer: Self-pay | Admitting: Internal Medicine

## 2022-07-30 ENCOUNTER — Encounter: Payer: Self-pay | Admitting: Internal Medicine

## 2022-08-15 ENCOUNTER — Other Ambulatory Visit: Payer: BC Managed Care – PPO

## 2022-09-05 ENCOUNTER — Ambulatory Visit: Payer: BC Managed Care – PPO | Admitting: Medical

## 2022-09-05 ENCOUNTER — Encounter: Payer: Self-pay | Admitting: Medical

## 2022-09-05 VITALS — BP 128/84 | HR 78 | Ht 63.5 in | Wt 236.8 lb

## 2022-09-05 DIAGNOSIS — Z0000 Encounter for general adult medical examination without abnormal findings: Secondary | ICD-10-CM | POA: Diagnosis not present

## 2022-09-05 DIAGNOSIS — F411 Generalized anxiety disorder: Secondary | ICD-10-CM

## 2022-09-05 DIAGNOSIS — Z1211 Encounter for screening for malignant neoplasm of colon: Secondary | ICD-10-CM

## 2022-09-05 DIAGNOSIS — D509 Iron deficiency anemia, unspecified: Secondary | ICD-10-CM

## 2022-09-05 DIAGNOSIS — Z780 Asymptomatic menopausal state: Secondary | ICD-10-CM

## 2022-09-05 DIAGNOSIS — G894 Chronic pain syndrome: Secondary | ICD-10-CM

## 2022-09-05 DIAGNOSIS — E559 Vitamin D deficiency, unspecified: Secondary | ICD-10-CM

## 2022-09-05 DIAGNOSIS — N1832 Chronic kidney disease, stage 3b: Secondary | ICD-10-CM

## 2022-09-05 DIAGNOSIS — E042 Nontoxic multinodular goiter: Secondary | ICD-10-CM

## 2022-09-05 DIAGNOSIS — Z1231 Encounter for screening mammogram for malignant neoplasm of breast: Secondary | ICD-10-CM

## 2022-09-05 DIAGNOSIS — E1122 Type 2 diabetes mellitus with diabetic chronic kidney disease: Secondary | ICD-10-CM

## 2022-09-05 DIAGNOSIS — E785 Hyperlipidemia, unspecified: Secondary | ICD-10-CM

## 2022-09-05 DIAGNOSIS — R0989 Other specified symptoms and signs involving the circulatory and respiratory systems: Secondary | ICD-10-CM

## 2022-09-05 LAB — CBC
Hematocrit: 38 % (ref 34.0–46.6)
MCH: 24.9 pg — ABNORMAL LOW (ref 26.6–33.0)
Platelets: 394 10*3/uL (ref 150–450)

## 2022-09-05 LAB — HEPATIC FUNCTION PANEL

## 2022-09-05 MED ORDER — FREESTYLE LIBRE 3 SENSOR MISC: 2.0000 | 11 refills | Status: DC

## 2022-09-05 NOTE — Patient Instructions (Addendum)
Please call to schedule your mammogram and bone density tests.   [facility]  [phone], [address] [place], [place] [address]   Expect a phone call about referral back for updated colonoscopy

## 2022-09-05 NOTE — Progress Notes (Signed)
Subjective:   HPI  [name] is a 62 y.o. female who presents for Chief Complaint  Patient presents with   Annual Exam    CPE ate at 9:30, hard pressure built up in her stomach, only relief is when she burps or passes gas, started last Thursday,    Patient Care Team: Arial Galligan, Cleda Mccreedy as PCP - General (Family Medicine) Dr. Terrace Arabia, as Consulting Physician (Endocrinology), [facility] Griselda Miner, MD as Consulting Physician (General Surgery) Thurmon Fair, MD as Consulting Physician (Cardiology) New dentist in June Eye doctor - Dr. Victoriano Lain [facility], Dr. Erick Blinks Dr. Faith Rogue, Dr. Claudette Laws, pain management   Concerns: She notes compliance with medicines currently.     Past Medical History:  Diagnosis Date   Abscess    increased drainage from abscess on buttock   Anal fistula    Anxiety    Bilateral hip pain 05/27/2015   Chronic pain syndrome 05/27/2015   Depression    sees Dr. Sherlyn Lick   Diabetes mellitus without complication [facility])    Diverticulosis    Elevated alkaline phosphatase level    Fatty liver    Gallstones    Hiatal hernia    Hyperlipidemia    Hypertension    Internal hemorrhoids    Sleep apnea    2008- sleep study, neg. for sleep apnea    Stroke [facility])    SVT (supraventricular tachycardia)    Symptomatic cholelithiasis 09/11/2018    Past Surgical History:  Procedure Laterality Date   ANAL EXAMINATION UNDER ANESTHESIA  02/21/11   anal fistula   BREAST SURGERY  patient does not remember date of procedure   pull fluid off lft br   CHOLECYSTECTOMY N/A 09/12/2018   Procedure: LAPAROSCOPIC CHOLECYSTECTOMY;  Surgeon: Almond Lint, MD;  Location: [facility] OR;  Service: General;  Laterality: N/A;   ELECTROPHYSIOLOGIC STUDY N/A 05/05/2015   Procedure: SVT Ablation;  Surgeon: Will Jorja Loa, MD;  Location: [facility] INVASIVE CV LAB;  Service: Cardiovascular;  Laterality: N/A;   EP IMPLANTABLE DEVICE N/A 01/30/2016    Procedure: Loop Recorder Insertion;  Surgeon: Thurmon Fair, MD;  Location: [facility] INVASIVE CV LAB;  Service: Cardiovascular;  Laterality: N/A;   INCISE AND DRAIN ABCESS     abscess on right thigh and buttock   KNEE ARTHROSCOPY     left   LAPAROSCOPIC APPENDECTOMY N/A 09/19/2018   Procedure: APPENDECTOMY LAPAROSCOPIC;  Surgeon: Berna Bue, MD;  Location: [facility] OR;  Service: General;  Laterality: N/A;   SHOULDER SURGERY  04/14/09   right    Social History   Socioeconomic History   Marital status: Divorced    Spouse name: Not on file   Number of children: 0   Years of education: Not on file   Highest education level: Not on file  Occupational History   Occupation: TEACHER ASSISTANT  Tobacco Use   Smoking status: Never   Smokeless tobacco: Never  Vaping Use   Vaping Use: Never used  Substance and Sexual Activity   Alcohol use: No   Drug use: No   Sexual activity: Yes  Other Topics Concern   Not on file  Social History Narrative   Not on file   Social Determinants of Health   Financial Resource Strain: Not on file  Food Insecurity: Food Insecurity Present (09/03/2019)   Hunger Vital Sign    Worried About Running Out of Food in the Last Year: Sometimes true    Ran Out of Food  in the Last Year: Sometimes true  Transportation Needs: No Transportation Needs (09/03/2019)   PRAPARE - Administrator, Civil Service (Medical): No    Lack of Transportation (Non-Medical): No  Physical Activity: Not on file  Stress: Not on file  Social Connections: Not on file  Intimate Partner Violence: Not on file    Family History  Problem Relation Age of Onset   Hypertension Mother    Alzheimer's disease Mother    Diabetes Father    Breast cancer Sister 43   Anesthesia problems Neg Hx    Hypotension Neg Hx    Malignant hyperthermia Neg Hx    Pseudochol deficiency Neg Hx    Colon cancer Neg Hx    Esophageal cancer Neg Hx    Stomach cancer Neg Hx    Rectal cancer Neg Hx     Thyroid disease Neg Hx      Current Outpatient Medications:    albuterol (VENTOLIN HFA) 108 (90 Base) MCG/ACT inhaler, Inhale 2 puffs into the lungs every 6 (six) hours as needed for wheezing or shortness of breath., Disp: 8 g, Rfl: 1   aspirin 81 MG EC tablet, Take 1 tablet (81 mg total) by mouth daily., Disp: 90 tablet, Rfl: 3   budesonide-formoterol (SYMBICORT) 160-4.5 MCG/ACT inhaler, Inhale 2 puffs into the lungs 2 (two) times daily., Disp: 1 each, Rfl: 5   calcium carbonate (CALCIUM 600) 600 MG TABS tablet, Take 1 tablet (600 mg total) by mouth 2 (two) times daily with a meal., Disp: 180 tablet, Rfl: 3   Continuous Glucose Sensor (FREESTYLE LIBRE 3 SENSOR) MISC, 1 Device by Does not apply route every 14 (fourteen) days., Disp: 6 each, Rfl: 3   Continuous Glucose Sensor (FREESTYLE LIBRE 3 SENSOR) MISC, 2 each by Does not apply route every 14 (fourteen) days. Place 1 sensor on the skin every 14 days. Use to check glucose continuously, Disp: 2 each, Rfl: 11   dexlansoprazole (DEXILANT) 60 MG capsule, TAKE 1  BY MOUTH ONCE DAILY, Disp: 30 capsule, Rfl: 2   dextromethorphan-guaiFENesin (MUCINEX DM) 30-600 MG 12hr tablet, Take 1 tablet by mouth 2 (two) times daily., Disp: , Rfl:    DULoxetine (CYMBALTA) 60 MG capsule, Take 1 capsule by mouth twice daily, Disp: 180 capsule, Rfl: 0   fenofibrate (TRICOR) 145 MG tablet, Take 1 tablet (145 mg total) by mouth daily., Disp: 90 tablet, Rfl: 3   ferrous gluconate (FERGON) 324 MG tablet, Take 1 tablet by mouth once daily with breakfast, Disp: 90 tablet, Rfl: 0   FIASP FLEXTOUCH 100 UNIT/ML FlexTouch Pen, Inject 36 Units into the skin with breakfast, with lunch, and with evening meal., Disp: 105 mL, Rfl: 1   furosemide (LASIX) 80 MG tablet, Take 1 tablet by mouth once daily, Disp: 90 tablet, Rfl: 0   hydrOXYzine (ATARAX) 10 MG tablet, Take 1 tablet (10 mg total) by mouth 2 (two) times daily as needed., Disp: 30 tablet, Rfl: 0   insulin glargine, 2 Unit  Dial, ([name] MAX SOLOSTAR) 300 UNIT/ML Solostar Pen, INJECT 86 UNITS INTO THE SKIN DAILY AT 6AM, Disp: 30 mL, Rfl: 0   Insulin Pen Needle (PEN NEEDLES) 32G X 6 MM MISC, Use as instructed to inject insulin in the morning, at noon, in the evening, and at bedtime, Disp: 400 each, Rfl: 3   losartan (COZAAR) 25 MG tablet, Take 1 tablet (25 mg total) by mouth daily., Disp: 90 tablet, Rfl: 3   metoprolol succinate (TOPROL-XL) 25 MG  24 hr tablet, Take 1 tablet by mouth once daily, Disp: 30 tablet, Rfl: 1   nortriptyline (PAMELOR) 75 MG capsule, Take 1 capsule (75 mg total) by mouth at bedtime. Please fill 90 day supply., Disp: 90 capsule, Rfl: 1   oxyCODONE-acetaminophen (PERCOCET) 7.5-325 MG tablet, Take 1 tablet by mouth every 6 (six) hours as needed., Disp: 120 tablet, Rfl: 0   potassium chloride (KLOR-CON) 10 MEQ tablet, Take 1 tablet by mouth twice daily, Disp: 180 tablet, Rfl: 0   rosuvastatin (CRESTOR) 20 MG tablet, Take 1 tablet (20 mg total) by mouth daily., Disp: 90 tablet, Rfl: 3   Simethicone 180 MG CAPS, Take 1 capsule (180 mg total) by mouth 3 (three) times daily as needed., Disp: 100 capsule, Rfl: 2   tirzepatide (MOUNJARO) 2.5 MG/0.5ML Pen, Inject 2.5 mg into the skin once a week., Disp: 6 mL, Rfl: 3   azithromycin (ZITHROMAX) 250 MG tablet, 2 tablets day 1, then 1 tablet days 2-4 (Patient not taking: Reported on 09/05/2022), Disp: 6 tablet, Rfl: 0   fluconazole (DIFLUCAN) 150 MG tablet, Take one tablet by mouth at the first sign of symptoms of yeast. If no resolution, repeat dose in 72 hours. (Patient not taking: Reported on 09/05/2022), Disp: 2 tablet, Rfl: 2   naloxone (NARCAN) nasal spray 4 mg/0.1 mL, , Disp: , Rfl:   Allergies  Allergen Reactions   Jardiance [Empagliflozin] Other (See Comments)    Recurrent yeast infections    Ozempic (0.25 Or 0.5 Mg-Dose) [Semaglutide(0.25 Or 0.5mg -Dos)]     Severe nausea, bad gas   Tramadol Nausea Only    Reviewed their medical, surgical, family,  social, medication, and allergy history and updated chart as appropriate.  Review of Systems  Constitutional:  Negative for chills, fever, malaise/fatigue and weight loss.  HENT:  Negative for congestion, ear pain, hearing loss, sore throat and tinnitus.   Eyes:  Negative for blurred vision, pain and redness.  Respiratory:  Negative for cough, hemoptysis and shortness of breath.   Cardiovascular:  Negative for chest pain, palpitations, orthopnea, claudication and leg swelling.  Gastrointestinal:  Positive for abdominal pain. Negative for blood in stool, constipation, diarrhea, nausea and vomiting.  Genitourinary:  Negative for dysuria, flank pain, frequency, hematuria and urgency.  Musculoskeletal:  Negative for falls, joint pain and myalgias.  Skin:  Negative for itching and rash.  Neurological:  Negative for dizziness, tingling, speech change, weakness and headaches.  Endo/Heme/Allergies:  Negative for polydipsia. Does not bruise/bleed easily.  Psychiatric/Behavioral:  Negative for depression and memory loss. The patient is not nervous/anxious and does not have insomnia.         Objective:  BP 128/84   Pulse 78   Ht 5' 3.5" (1.613 m)   Wt 236 lb 12.8 oz (107.4 kg)   LMP 02/09/2017   BMI 41.29 kg/m   General appearance: alert, no distress, WD/WN, Hispanic female Skin: Scarring of bilateral axillae from prior hidradenitis, scattered macules, otherwise unremarkable Neck: supple, no lymphadenopathy, no thyromegaly, no masses, normal ROM, no bruits Chest: non tender, normal shape and expansion Heart: RRR, normal S1, S2, no murmurs Lungs: CTA bilaterally, no wheezes, rhonchi, or rales Abdomen: +bs, soft, mild epigastric and RUQ tenderness, otherwise non tender, non distended, no masses, no hepatomegaly, no splenomegaly, no bruits Musculoskeletal: no obvious deformity or swelling.   Unsteady on feet, unchanged from prior Extremities: 1+ nonpitting edema, no cyanosis, no  clubbing Pulses: 2+ symmetric, upper and 1+ lower extremities, normal cap refill Neurological:  alert, gait a little unsteady when trying to get on exam table, otherwise oriented x 3, CN2-12 intact, strength otherwise normal upper extremities and lower extremities, sensation normal throughout, DTRs 1+  Psychiatric: normal affect, behavior normal, pleasant  Breast/gyn - deferred to gyn   Assessment and Plan :   Encounter Diagnoses  Name Primary?   Encounter for health maintenance examination in adult Yes   Multinodular goiter    Chronic pain syndrome    Generalized anxiety disorder    Type 2 diabetes mellitus with stage 3b chronic kidney disease, with long-term current use of insulin (HCC)    Dyslipidemia    Vitamin D deficiency    Screen for colon cancer    Encounter for screening mammogram for malignant neoplasm of breast    Decreased pulses in feet    Iron deficiency anemia, unspecified iron deficiency anemia type    Postmenopausal estrogen deficiency     This visit was a preventative care visit, also known as wellness visit or routine physical.   Topics typically include healthy lifestyle, diet, exercise, preventative care, vaccinations, sick and well care, proper use of emergency dept and after hours care, as well as other concerns.     Recommendations: Continue to return yearly for your annual wellness and preventative care visits.  This gives [place] a chance to discuss healthy lifestyle, exercise, vaccinations, review your chart record, and perform screenings where appropriate.  I recommend you see your eye doctor yearly for routine vision care.  I recommend you see your dentist yearly for routine dental care including hygiene visits twice yearly.   Vaccination recommendations were reviewed Immunization History  Administered Date(s) Administered   Influenza Split 12/23/2020   Influenza,inj,Quad PF,6+ Mos 11/15/2017, 11/11/2018, 11/16/2021   Influenza-Unspecified 12/24/2013,  12/26/2015, 11/02/2016, 11/11/2018, 01/15/2020   PFIZER(Purple Top)SARS-COV-2 Vaccination 05/07/2019, 06/03/2019, 01/15/2020, 09/25/2020   Pfizer Covid-19 Vaccine Bivalent Booster 58yrs & up 12/23/2020   Pneumococcal Polysaccharide-23 11/11/2018   Respiratory Syncytial Virus Vaccine,Recomb Aduvanted(Arexvy) 06/21/2022   Tdap 08/03/2017   Zoster Recombinat (Shingrix) 11/11/2018, 01/29/2019    Screening for cancer: Colon cancer screening: I reviewed your colonoscopy on file from 2018 Due repeat.  Referral placed  Breast cancer screening: You should perform a self breast exam monthly.   Continue yearly screening mammogram  Cervical cancer screening: Advised f/u with gynecology  Skin cancer screening: Check your skin regularly for new changes, growing lesions, or other lesions of concern Come in for evaluation if you have skin lesions of concern.  Lung cancer screening: If you have a greater than 20 pack year history of tobacco use, then you may qualify for lung cancer screening with a chest CT scan.   Please call your insurance company to inquire about coverage for this test.  We currently don't have screenings for other cancers besides breast, cervical, colon, and lung cancers.  If you have a strong family history of cancer or have other cancer screening concerns, please let me know.    Bone health: Get at least 150 minutes of aerobic exercise weekly Get weight bearing exercise at least once weekly Bone density test:  A bone density test is an imaging test that uses a type of X-ray to measure the amount of calcium and other minerals in your bones. The test may be used to diagnose or screen you for a condition that causes weak or thin bones (osteoporosis), predict your risk for a broken bone (fracture), or determine how well your osteoporosis treatment is working. The bone density  test is recommended for females 65 and older, or females or males <65 if certain risk factors such as  thyroid disease, long term use of steroids such as for asthma or rheumatological issues, vitamin D deficiency, estrogen deficiency, family history of osteoporosis, self or family history of fragility fracture in first degree relative.   Please call to schedule your bone density test.   [facility]  [phone] [address], [address] [place], [place] [address]    Heart health: Get at least 150 minutes of aerobic exercise weekly Limit alcohol It is important to maintain a healthy blood pressure and healthy cholesterol numbers  Heart disease screening: Screening for heart disease includes screening for blood pressure, fasting lipids, glucose/diabetes screening, BMI height to weight ratio, reviewed of smoking status, physical activity, and diet.    Goals include blood pressure 120/80 or less, maintaining a healthy lipid/cholesterol profile, preventing diabetes or keeping diabetes numbers under good control, not smoking or using tobacco products, exercising most days per week or at least 150 minutes per week of exercise, and eating healthy variety of fruits and vegetables, healthy oils, and avoiding unhealthy food choices like fried food, fast food, high sugar and high cholesterol foods.      Medical care options: I recommend you continue to seek care here first for routine care.  We try really hard to have available appointments Monday through Friday daytime hours for sick visits, acute visits, and physicals.  Urgent care should be used for after hours and weekends for significant issues that cannot wait till the next day.  The emergency department should be used for significant potentially life-threatening emergencies.  The emergency department is expensive, can often have long wait times for less significant concerns, so try to utilize primary care, urgent care, or telemedicine when possible to avoid unnecessary trips to the emergency department.  Virtual  visits and telemedicine have been introduced since the pandemic started in 2020, and can be convenient ways to receive medical care.  We offer virtual appointments as well to assist you in a variety of options to seek medical care.   Advanced Directives: I recommend you consider completing a Health Care Power of Attorney and Living Will.   These documents respect your wishes and help alleviate burdens on your loved ones if you were to become terminally ill or be in a position to need those documents enforced.    You can complete Advanced Directives yourself, have them notarized, then have copies made for our office, for you and for anybody you feel should have them in safe keeping.  Or, you can have an attorney prepare these documents.   If you haven't updated your Last Will and Testament in a while, it may be worthwhile having an attorney prepare these documents together and save on some costs.        Separate significant issues discussed: Chronic heart failure - follow up with cardiology for yearly check in  Thoracic aortic atherosclerosis - continue statin Crestor 20mg  daily, counseled on low-cholesterol diet.  Ideally LDL should be less than 70, but current challenges is the uncontrolled diabetes.  Continue statin and fenofibrate.   Uncontrolled diabetes-sees endo, recently started on mounjaro.  Obesity-discussed need for weight loss through healthy diet and exercise   chronic back pain-managed by specialist  Anxiety, PTSD- continue current medication    Jarika was seen today for annual exam.  Diagnoses and all orders for this visit:  Encounter for health maintenance  examination in adult -     Ambulatory referral to Gastroenterology -     MM 3D SCREENING MAMMOGRAM BILATERAL BREAST -     DG Bone Density; Future -     VAS [place] ABI WITH/WO TBI; Future -     Iron, TIBC and Ferritin Panel -     CBC -     Hepatic function panel  Multinodular goiter  Chronic pain  syndrome  Generalized anxiety disorder  Type 2 diabetes mellitus with stage 3b chronic kidney disease, with long-term current use of insulin (HCC)  Dyslipidemia  Vitamin D deficiency  Screen for colon cancer -     Ambulatory referral to Gastroenterology  Encounter for screening mammogram for malignant neoplasm of breast -     MM 3D SCREENING MAMMOGRAM BILATERAL BREAST  Decreased pulses in feet -     VAS [place] ABI WITH/WO TBI; Future  Iron deficiency anemia, unspecified iron deficiency anemia type -     Iron, TIBC and Ferritin Panel -     CBC  Postmenopausal estrogen deficiency -     DG Bone Density; Future  Other orders -     Continuous Glucose Sensor (FREESTYLE LIBRE 3 SENSOR) MISC; 2 each by Does not apply route every 14 (fourteen) days. Place 1 sensor on the skin every 14 days. Use to check glucose continuously     Follow-up pending labs, yearly for physical

## 2022-09-06 ENCOUNTER — Other Ambulatory Visit: Payer: Self-pay | Admitting: Medical

## 2022-09-06 ENCOUNTER — Encounter: Payer: Self-pay | Admitting: Internal Medicine

## 2022-09-06 ENCOUNTER — Other Ambulatory Visit: Payer: BC Managed Care – PPO

## 2022-09-06 DIAGNOSIS — Z0000 Encounter for general adult medical examination without abnormal findings: Secondary | ICD-10-CM

## 2022-09-06 DIAGNOSIS — Z780 Asymptomatic menopausal state: Secondary | ICD-10-CM

## 2022-09-06 DIAGNOSIS — I4719 Other supraventricular tachycardia: Secondary | ICD-10-CM

## 2022-09-06 DIAGNOSIS — Z87898 Personal history of other specified conditions: Secondary | ICD-10-CM

## 2022-09-06 LAB — HEPATIC FUNCTION PANEL
ALT: 14 IU/L (ref 0–32)
Albumin: 3.9 g/dL (ref 3.9–4.9)
Alkaline Phosphatase: 85 IU/L (ref 44–121)
Bilirubin Total: 0.2 mg/dL (ref 0.0–1.2)
Bilirubin, Direct: 0.12 mg/dL (ref 0.00–0.40)
Total Protein: 6.2 g/dL (ref 6.0–8.5)

## 2022-09-06 LAB — IRON,TIBC AND FERRITIN PANEL
Ferritin: 47 ng/mL (ref 15–150)
Iron Saturation: 19 % (ref 15–55)
Iron: 70 ug/dL (ref 27–139)
Total Iron Binding Capacity: 376 ug/dL (ref 250–450)
UIBC: 306 ug/dL (ref 118–369)

## 2022-09-06 LAB — CBC
Hemoglobin: 12.1 g/dL (ref 11.1–15.9)
MCHC: 31.8 g/dL (ref 31.5–35.7)
MCV: 78 fL — ABNORMAL LOW (ref 79–97)
RBC: 4.85 x10E6/uL (ref 3.77–5.28)
RDW: 13.2 % (ref 11.7–15.4)
WBC: 12.2 10*3/uL — ABNORMAL HIGH (ref 3.4–10.8)

## 2022-09-06 MED ORDER — FERROUS GLUCONATE 324 (38 FE) MG PO TABS: 324.0000 mg | ORAL_TABLET | Freq: Every day | ORAL | 3 refills | Status: DC

## 2022-09-06 MED ORDER — ASPIRIN 81 MG PO TBEC: 81.0000 mg | DELAYED_RELEASE_TABLET | Freq: Every day | ORAL | 3 refills | Status: AC

## 2022-09-06 MED ORDER — DULOXETINE HCL 60 MG PO CPEP: 60.0000 mg | ORAL_CAPSULE | Freq: Two times a day (BID) | ORAL | 3 refills | Status: DC

## 2022-09-06 MED ORDER — METOPROLOL SUCCINATE ER 25 MG PO TB24: 25.0000 mg | ORAL_TABLET | Freq: Every day | ORAL | 3 refills | Status: DC

## 2022-09-06 MED ORDER — POTASSIUM CHLORIDE ER 10 MEQ PO TBCR: 10.0000 meq | EXTENDED_RELEASE_TABLET | Freq: Two times a day (BID) | ORAL | 3 refills | Status: DC

## 2022-09-06 MED ORDER — FUROSEMIDE 80 MG PO TABS: 80.0000 mg | ORAL_TABLET | Freq: Every day | ORAL | 3 refills | Status: DC

## 2022-09-06 NOTE — Progress Notes (Signed)
Results sent through MyChart

## 2022-09-08 ENCOUNTER — Other Ambulatory Visit: Payer: Self-pay | Admitting: Internal Medicine

## 2022-09-12 ENCOUNTER — Encounter: Payer: BC Managed Care – PPO | Admitting: Registered Nurse

## 2022-09-13 ENCOUNTER — Other Ambulatory Visit: Payer: Self-pay | Admitting: Registered Nurse

## 2022-09-14 ENCOUNTER — Encounter: Payer: Self-pay | Admitting: Internal Medicine

## 2022-09-17 ENCOUNTER — Encounter: Payer: BC Managed Care – PPO | Attending: Registered Nurse | Admitting: Registered Nurse

## 2022-09-17 VITALS — BP 118/69 | HR 87 | Ht 63.5 in | Wt 229.0 lb

## 2022-09-17 DIAGNOSIS — Z79891 Long term (current) use of opiate analgesic: Secondary | ICD-10-CM | POA: Diagnosis present

## 2022-09-17 DIAGNOSIS — M7061 Trochanteric bursitis, right hip: Secondary | ICD-10-CM | POA: Insufficient documentation

## 2022-09-17 DIAGNOSIS — M47817 Spondylosis without myelopathy or radiculopathy, lumbosacral region: Secondary | ICD-10-CM | POA: Diagnosis present

## 2022-09-17 DIAGNOSIS — G894 Chronic pain syndrome: Secondary | ICD-10-CM | POA: Diagnosis present

## 2022-09-17 DIAGNOSIS — M7062 Trochanteric bursitis, left hip: Secondary | ICD-10-CM | POA: Diagnosis present

## 2022-09-17 DIAGNOSIS — M5416 Radiculopathy, lumbar region: Secondary | ICD-10-CM | POA: Diagnosis present

## 2022-09-17 DIAGNOSIS — Z5181 Encounter for therapeutic drug level monitoring: Secondary | ICD-10-CM | POA: Diagnosis present

## 2022-09-17 MED ORDER — OXYCODONE-ACETAMINOPHEN 7.5-325 MG PO TABS: 1.0000 | ORAL_TABLET | Freq: Four times a day (QID) | ORAL | 0 refills | Status: DC | PRN

## 2022-09-17 MED ORDER — SENNOSIDES 8.6 MG PO TABS: 1.0000 | ORAL_TABLET | Freq: Every day | ORAL | 0 refills | Status: DC

## 2022-09-17 NOTE — Progress Notes (Signed)
Subjective:    Patient ID: [name], female    DOB: 09-03-1960, 62 y.o.   MRN: [id number]  HPI: [name] is a 62 y.o. female who returns for follow up appointment for chronic pain and medication refill. She states her pain is located in her lower back radiating into her bilateral hios L>R. She rates her pain 7. Her current exercise regime is walking and performing stretching exercises.  [name] Morphine equivalent is 45.00 MME.   Last UDS was Performed on 05/11/2022, it was consistent.    Pain Inventory Average Pain 8 Pain Right Now 7 My pain is intermittent, sharp, stabbing, and tingling  In the last 24 hours, has pain interfered with the following? General activity 10 Relation with others 9 Enjoyment of life 8 What TIME of day is your pain at its worst? daytime and evening Sleep (in general) Fair  Pain is worse with: walking, standing, and some activites Pain improves with: rest, therapy/exercise, and medication Relief from Meds: 8  Family History  Problem Relation Age of Onset   Hypertension Mother    Alzheimer's disease Mother    Diabetes Father    Breast cancer Sister 55   Anesthesia problems Neg Hx    Hypotension Neg Hx    Malignant hyperthermia Neg Hx    Pseudochol deficiency Neg Hx    Colon cancer Neg Hx    Esophageal cancer Neg Hx    Stomach cancer Neg Hx    Rectal cancer Neg Hx    Thyroid disease Neg Hx    Social History   Socioeconomic History   Marital status: Divorced    Spouse name: Not on file   Number of children: 0   Years of education: Not on file   Highest education level: Not on file  Occupational History   Occupation: TEACHER ASSISTANT  Tobacco Use   Smoking status: Never   Smokeless tobacco: Never  Vaping Use   Vaping Use: Never used  Substance and Sexual Activity   Alcohol use: No   Drug use: No   Sexual activity: Yes  Other Topics Concern   Not on file  Social History Narrative   Not on file   Social  Determinants of Health   Financial Resource Strain: Not on file  Food Insecurity: Food Insecurity Present (09/03/2019)   Hunger Vital Sign    Worried About Running Out of Food in the Last Year: Sometimes true    Ran Out of Food in the Last Year: Sometimes true  Transportation Needs: No Transportation Needs (09/03/2019)   PRAPARE - Administrator, Civil Service (Medical): No    Lack of Transportation (Non-Medical): No  Physical Activity: Not on file  Stress: Not on file  Social Connections: Not on file   Past Surgical History:  Procedure Laterality Date   ANAL EXAMINATION UNDER ANESTHESIA  02/21/11   anal fistula   BREAST SURGERY  patient does not remember date of procedure   pull fluid off lft br   CHOLECYSTECTOMY N/A 09/12/2018   Procedure: LAPAROSCOPIC CHOLECYSTECTOMY;  Surgeon: Almond Lint, MD;  Location: [facility];  Service: General;  Laterality: N/A;   ELECTROPHYSIOLOGIC STUDY N/A 05/05/2015   Procedure: SVT Ablation;  Surgeon: Will Jorja Loa, MD;  Location: [facility];  Service: Cardiovascular;  Laterality: N/A;   EP IMPLANTABLE DEVICE N/A 01/30/2016   Procedure: Loop Recorder Insertion;  Surgeon: Thurmon Fair, MD;  [facility]: [facility];  Service: Cardiovascular;  Laterality:  N/A;   INCISE AND DRAIN ABCESS     abscess on right thigh and buttock   KNEE ARTHROSCOPY     left   LAPAROSCOPIC APPENDECTOMY N/A 09/19/2018   Procedure: APPENDECTOMY LAPAROSCOPIC;  Surgeon: Berna Bue, MD;  Location: [facility];  Service: General;  Laterality: N/A;   SHOULDER SURGERY  04/14/09   right   Past Surgical History:  Procedure Laterality Date   ANAL EXAMINATION UNDER ANESTHESIA  02/21/11   anal fistula   BREAST SURGERY  patient does not remember date of procedure   pull fluid off lft br   CHOLECYSTECTOMY N/A 09/12/2018   Procedure: LAPAROSCOPIC CHOLECYSTECTOMY;  Surgeon: Almond Lint, MD;  Location: [facility];  Service: General;  Laterality: N/A;    ELECTROPHYSIOLOGIC STUDY N/A 05/05/2015   Procedure: SVT Ablation;  Surgeon: Will Jorja Loa, MD;  [facility];  Service: Cardiovascular;  Laterality: N/A;   EP IMPLANTABLE DEVICE N/A 01/30/2016   Procedure: Loop Recorder Insertion;  Surgeon: Thurmon Fair, MD;  [facility];  Service: Cardiovascular;  Laterality: N/A;   INCISE AND DRAIN ABCESS     abscess on right thigh and buttock   KNEE ARTHROSCOPY     left   LAPAROSCOPIC APPENDECTOMY N/A 09/19/2018   Procedure: APPENDECTOMY LAPAROSCOPIC;  Surgeon: Berna Bue, MD;  [facility]: [facility];  Service: General;  Laterality: N/A;   SHOULDER SURGERY  04/14/09   right   Past Medical History:  Diagnosis Date   Abscess    increased drainage from abscess on buttock   Anal fistula    Anxiety    Bilateral hip pain 05/27/2015   Chronic pain syndrome 05/27/2015   Depression    sees Dr. Sherlyn Lick   Diabetes mellitus without complication [facility])    Diverticulosis    Elevated alkaline phosphatase level    Fatty liver    Gallstones    Hiatal hernia    Hyperlipidemia    Hypertension    Internal hemorrhoids    Sleep apnea    2008- sleep study, neg. for sleep apnea    Stroke (HCC)    SVT (supraventricular tachycardia)    Symptomatic cholelithiasis 09/11/2018   LMP 02/09/2017   Opioid Risk Score:   Fall Risk Score:  `1  Depression screen PHQ 2/9     09/05/2022    2:16 PM 07/17/2022    3:22 PM 05/11/2022    3:18 PM 03/01/2022    3:10 PM 10/04/2021    3:08 PM 05/11/2021   12:11 PM 12/28/2020    3:18 PM  Depression screen PHQ 2/9  Decreased Interest 0 0 0 0 0 0 0  Down, Depressed, Hopeless 0 0 0 0 0 0 0  PHQ - 2 Score 0 0 0 0 0 0 0      Review of Systems  Musculoskeletal:  Positive for back pain.       B/L thigh hip pain  All other systems reviewed and are negative.      Objective:   Physical Exam Constitutional:      Appearance: Normal appearance.  Cardiovascular:     Rate and Rhythm: Normal rate  and regular rhythm.     Pulses: Normal pulses.     Heart sounds: Normal heart sounds.  Pulmonary:     Effort: Pulmonary effort is normal.     Breath sounds: Normal breath sounds.  Musculoskeletal:     Cervical back: Normal range of motion and neck supple.  Comments: Normal Muscle Bulk and Muscle Testing Reveals:  Upper Extremities: Full ROM and Muscle Strength 5/5 Lumbar Paraspinal Tenderness: L-4-L-5 Bilateral Greater Trochanter Tenderness: L>R Lower Extremities: Full ROM and Muscle Strength 5/5 Arises from Table with Ease  Narrow Based  Gait     Skin:    General: Skin is warm and dry.  Neurological:     Mental Status: She is alert and oriented to person, place, and time.  Psychiatric:        Mood and Affect: Mood normal.        Behavior: Behavior normal.         Assessment & Plan:  1.Chronic Bilateral Hip pain L>R---endstage OA of hips..Continue HEP as tolerated and Heat and Ice Therapy. 09/17/2022 Refilled: Oxycodone 7.5/325 one tablet 4 times a day as needed for moderate pain #120. Second script sent for the following month.09/17/2022 We will continue the opioid monitoring program, this consists of regular clinic visits, examinations, urine drug screen, pill counts as well as use of [place] Controlled Substance Reporting system. A 12 month History has been reviewed on the [place] Controlled Substance Reporting System on 09/17/2022 2. Lumbar Radiculitis:  Continue medication regimen with Pamelor. Continue Cymbalta and Continue  HEP as Tolerated.  Continue to Monitor. 09/17/2022.          -              3. Morbid obesity: Continue with healthy diet Regime and HEP. Encouraged to continue with healthy diet regime and HEP. 09/17/2022 4.Bilateral  Greater Trochanteric Bursitis:.Continue with Heat and Ice Therapy. Continue to Monitor. 09/17/2022. 5. Left Knee Pain: No complaints Today.Continue with HEP as Tolerated. Continue to Monitor. 09/17/2022 6. Muscle Spasm:  Continue  Methocarbamol. Continue to monitor. 09/17/2022.    F/U in 2 months

## 2022-09-19 ENCOUNTER — Telehealth: Payer: Self-pay | Admitting: Internal Medicine

## 2022-09-19 ENCOUNTER — Encounter: Payer: Self-pay | Admitting: Internal Medicine

## 2022-09-19 ENCOUNTER — Ambulatory Visit (AMBULATORY_SURGERY_CENTER): Payer: BC Managed Care – PPO

## 2022-09-19 VITALS — Ht 63.5 in | Wt 229.0 lb

## 2022-09-19 DIAGNOSIS — Z86010 Personal history of colonic polyps: Secondary | ICD-10-CM

## 2022-09-19 MED ORDER — ONDANSETRON HCL 4 MG PO TABS: 4.0000 mg | ORAL_TABLET | ORAL | 0 refills | Status: DC

## 2022-09-19 NOTE — Progress Notes (Signed)
No egg or soy allergy known to patient  No issues known to pt with past sedation with any surgeries or procedures Patient denies ever being told they had issues or difficulty with intubation  No FH of Malignant Hyperthermia Pt is not on diet pills Pt is not on  home 02  Pt is not on blood thinners  Pt denies issues with constipation  No A fib or A flutter Have any cardiac testing pending--no  LOA: independent  Prep: 2 day mirialax   Patient's chart reviewed by Cathlyn Parsons CNRA prior to previsit and patient appropriate for the [facility].  Previsit completed and red dot placed by patient's name on their procedure day (on provider's schedule).     PV competed with patient. Prep instructions sent via mychart and home address.

## 2022-09-19 NOTE — Telephone Encounter (Signed)
Pt is having a lot of stomach issues- vomiting- yellow bile, nausea, diarrhea, having trouble eating, stomach pain. Pt would like an Ultrasound  done to make sure nothing is wrong with her bowels

## 2022-09-20 NOTE — Telephone Encounter (Signed)
Pt is not available the appointment times we have due to other appts she has. Pt will wait until next week and if it still bothering her then she will call back to schedule

## 2022-09-21 ENCOUNTER — Other Ambulatory Visit: Payer: BC Managed Care – PPO

## 2022-09-21 ENCOUNTER — Ambulatory Visit: Admission: RE | Admit: 2022-09-21 | Payer: BC Managed Care – PPO | Source: Ambulatory Visit

## 2022-09-21 DIAGNOSIS — E042 Nontoxic multinodular goiter: Secondary | ICD-10-CM

## 2022-09-24 ENCOUNTER — Ambulatory Visit: Payer: BC Managed Care – PPO | Admitting: Medical

## 2022-09-24 ENCOUNTER — Ambulatory Visit (HOSPITAL_COMMUNITY): Payer: BC Managed Care – PPO

## 2022-09-24 VITALS — BP 110/72 | HR 78 | Temp 97.4°F | Wt 226.8 lb

## 2022-09-24 DIAGNOSIS — R195 Other fecal abnormalities: Secondary | ICD-10-CM | POA: Diagnosis not present

## 2022-09-24 DIAGNOSIS — R1013 Epigastric pain: Secondary | ICD-10-CM | POA: Diagnosis not present

## 2022-09-24 DIAGNOSIS — E1165 Type 2 diabetes mellitus with hyperglycemia: Secondary | ICD-10-CM

## 2022-09-24 DIAGNOSIS — Z79899 Other long term (current) drug therapy: Secondary | ICD-10-CM | POA: Diagnosis not present

## 2022-09-24 DIAGNOSIS — Z794 Long term (current) use of insulin: Secondary | ICD-10-CM

## 2022-09-24 MED ORDER — ONDANSETRON HCL 4 MG PO TABS: 4.0000 mg | ORAL_TABLET | ORAL | 0 refills | Status: DC

## 2022-09-24 NOTE — Progress Notes (Signed)
Subjective:  [name] is a 62 y.o. female who presents for Chief Complaint  Patient presents with   Consult    Stomach issues- stomach pain- x 3 weeks. When she has to poop, she lets a big fart out and then when her poop in diarrhea form will come out. She hasn't had a regular BM in 3 weeks since being on mounjaro     Here for abdominal issues. She notes at least 3 weeks of issues.  She notes belching often, eats and shortly after feels nauseated of has vomited shortly after eating.  Having gas and loose stools daily.  She started mounjaro injection at the end of May and has been having these problems since.  No fever, no rash, no chest pain, no blood in stool.  No other aggravating or relieving factors.    No other c/o.  Past Medical History:  Diagnosis Date   Abscess    increased drainage from abscess on buttock   Anal fistula    Anxiety    Bilateral hip pain 05/27/2015   Chronic pain syndrome 05/27/2015   Depression    sees Dr. Sherlyn Lick   Diabetes mellitus without complication [facility])    Diverticulosis    Elevated alkaline phosphatase level    Fatty liver    Gallstones    Hiatal hernia    Hyperlipidemia    Hypertension    Internal hemorrhoids    Sleep apnea    2008- sleep study, neg. for sleep apnea    Stroke [facility])    SVT (supraventricular tachycardia)    Symptomatic cholelithiasis 09/11/2018   Past Surgical History:  Procedure Laterality Date   ANAL EXAMINATION UNDER ANESTHESIA  02/21/11   anal fistula   BREAST SURGERY  patient does not remember date of procedure   pull fluid off lft br   CHOLECYSTECTOMY N/A 09/12/2018   Procedure: LAPAROSCOPIC CHOLECYSTECTOMY;  Surgeon: Almond Lint, MD;  [facility]: [facility];  Service: General;  Laterality: N/A;   ELECTROPHYSIOLOGIC STUDY N/A 05/05/2015   Procedure: SVT Ablation;  Surgeon: Will Jorja Loa, MD;  [facility];  Service: Cardiovascular;  Laterality: N/A;   EP IMPLANTABLE DEVICE N/A 01/30/2016    Procedure: Loop Recorder Insertion;  Surgeon: Thurmon Fair, MD;  [facility]: [facility];  Service: Cardiovascular;  Laterality: N/A;   INCISE AND DRAIN ABCESS     abscess on right thigh and buttock   KNEE ARTHROSCOPY     left   LAPAROSCOPIC APPENDECTOMY N/A 09/19/2018   Procedure: APPENDECTOMY LAPAROSCOPIC;  Surgeon: Berna Bue, MD;  Location: [facility];  Service: General;  Laterality: N/A;   SHOULDER SURGERY  04/14/09   right     The following portions of the patient's history were reviewed and updated as appropriate: allergies, current medications, past family history, past medical history, past social history, past surgical history and problem list.  ROS Otherwise as in subjective above  Objective: BP 110/72   Pulse 78   Temp (!) 97.4 F (36.3 C)   Wt 226 lb 12.8 oz (102.9 kg)   LMP 02/09/2017   BMI 39.55 kg/m   General appearance: alert, no distress, well developed, well nourished Heart rrr, normal s1s2, no murmur Lungs clear Abdomen: +bs, soft, mild to moderate epigastric tendnerss otherwise mild generalized tendnerss, non distended, no masses, no hepatomegaly, no splenomegaly Pulses: 2+ radial pulses, 2+ pedal pulses, normal cap refill Ext: no edema   Assessment: Encounter Diagnoses  Name Primary?   Epigastric pain  Yes   Loose stools    High risk medication use    Type 2 diabetes mellitus with hyperglycemia, with long-term current use of insulin (HCC)      Plan: We discussed her symptoms and exam findings.  The symptoms seem to correlate with the time she started [facility].  I advise she continue to follow-up with her endocrinologist.  She had already called them and they had her stop Mounjaro about a week ago.  Labs today as below.  Will go ahead and pursue scan as well.  Advised clear fluids only for the next 24 hours.  Then keep the diet bland for the next few days.  Can use Zofran as needed for nausea.  We will call with lab results.  If worse in  the next 48 hours then go to the emergency department.  Emmalou was seen today for consult.  Diagnoses and all orders for this visit:  Epigastric pain -     Lipase -     CBC -     Comprehensive metabolic panel -     CT ABDOMEN W CONTRAST; Future  Loose stools -     Lipase -     CBC -     Comprehensive metabolic panel -     CT ABDOMEN W CONTRAST; Future  High risk medication use  Type 2 diabetes mellitus with hyperglycemia, with long-term current use of insulin (HCC)  Other orders -     ondansetron (ZOFRAN) 4 MG tablet; Take 1 tablet (4 mg total) by mouth as directed.    Follow up: pending labs, scan

## 2022-09-25 LAB — COMPREHENSIVE METABOLIC PANEL
ALT: 12 IU/L (ref 0–32)
AST: 11 IU/L (ref 0–40)
Albumin: 3.9 g/dL (ref 3.9–4.9)
Alkaline Phosphatase: 80 IU/L (ref 44–121)
BUN/Creatinine Ratio: 11 — ABNORMAL LOW (ref 12–28)
BUN: 20 mg/dL (ref 8–27)
Bilirubin Total: <0.2 mg/dL (ref 0.0–1.2)
CO2: 25 mmol/L (ref 20–29)
Calcium: 10.3 mg/dL (ref 8.7–10.3)
Chloride: 98 mmol/L (ref 96–106)
Creatinine, Ser: 1.74 mg/dL — ABNORMAL HIGH (ref 0.57–1.00)
Globulin, Total: 2.4 g/dL (ref 1.5–4.5)
Glucose: 201 mg/dL — ABNORMAL HIGH (ref 70–99)
Potassium: 4.1 mmol/L (ref 3.5–5.2)
Sodium: 138 mmol/L (ref 134–144)
Total Protein: 6.3 g/dL (ref 6.0–8.5)
eGFR: 33 mL/min/{1.73_m2} — ABNORMAL LOW (ref >59)

## 2022-09-25 LAB — CBC
Hematocrit: 34.3 % (ref 34.0–46.6)
Hemoglobin: 10.7 g/dL — ABNORMAL LOW (ref 11.1–15.9)
MCH: 25.1 pg — ABNORMAL LOW (ref 26.6–33.0)
MCHC: 31.2 g/dL — ABNORMAL LOW (ref 31.5–35.7)
MCV: 80 fL (ref 79–97)
Platelets: 507 10*3/uL — ABNORMAL HIGH (ref 150–450)
RBC: 4.27 x10E6/uL (ref 3.77–5.28)
RDW: 13.1 % (ref 11.7–15.4)
WBC: 12.3 10*3/uL — ABNORMAL HIGH (ref 3.4–10.8)

## 2022-09-25 LAB — LIPASE: Lipase: 26 U/L (ref 14–72)

## 2022-09-25 NOTE — Progress Notes (Signed)
 Results sent through MyChart

## 2022-09-26 ENCOUNTER — Encounter: Payer: Self-pay | Admitting: Registered Nurse

## 2022-09-27 ENCOUNTER — Ambulatory Visit: Payer: BC Managed Care – PPO

## 2022-09-29 ENCOUNTER — Emergency Department (HOSPITAL_COMMUNITY): Payer: BC Managed Care – PPO

## 2022-09-29 ENCOUNTER — Other Ambulatory Visit: Payer: Self-pay

## 2022-09-29 ENCOUNTER — Emergency Department (HOSPITAL_COMMUNITY)
Admission: EM | Admit: 2022-09-29 | Discharge: 2022-09-29 | Disposition: A | Discharge status: Home / Self Care | Payer: BC Managed Care – PPO | Attending: Emergency Medicine | Admitting: Emergency Medicine

## 2022-09-29 DIAGNOSIS — Z794 Long term (current) use of insulin: Secondary | ICD-10-CM | POA: Insufficient documentation

## 2022-09-29 DIAGNOSIS — R1013 Epigastric pain: Secondary | ICD-10-CM | POA: Diagnosis not present

## 2022-09-29 DIAGNOSIS — R112 Nausea with vomiting, unspecified: Secondary | ICD-10-CM | POA: Diagnosis present

## 2022-09-29 LAB — URINALYSIS, ROUTINE W REFLEX MICROSCOPIC
Bilirubin Urine: NEGATIVE
Glucose, UA: >=500 mg/dL — AB
Hgb urine dipstick: NEGATIVE
Ketones, ur: NEGATIVE mg/dL
Leukocytes,Ua: NEGATIVE
Nitrite: NEGATIVE
Protein, ur: NEGATIVE mg/dL
Specific Gravity, Urine: 1.02 (ref 1.005–1.030)
pH: 5 (ref 5.0–8.0)

## 2022-09-29 LAB — COMPREHENSIVE METABOLIC PANEL
ALT: 12 U/L (ref 0–44)
AST: 12 U/L — ABNORMAL LOW (ref 15–41)
Albumin: 3.6 g/dL (ref 3.5–5.0)
Alkaline Phosphatase: 73 U/L (ref 38–126)
Anion gap: 10 (ref 5–15)
BUN: 21 mg/dL (ref 8–23)
CO2: 27 mmol/L (ref 22–32)
Calcium: 9.7 mg/dL (ref 8.9–10.3)
Chloride: 98 mmol/L (ref 98–111)
Creatinine, Ser: 1.61 mg/dL — ABNORMAL HIGH (ref 0.44–1.00)
GFR, Estimated: 36 mL/min — ABNORMAL LOW (ref >60)
Glucose, Bld: 356 mg/dL — ABNORMAL HIGH (ref 70–99)
Potassium: 3.7 mmol/L (ref 3.5–5.1)
Sodium: 135 mmol/L (ref 135–145)
Total Bilirubin: 0.5 mg/dL (ref 0.3–1.2)
Total Protein: 7.2 g/dL (ref 6.5–8.1)

## 2022-09-29 LAB — CBC
HCT: 34.3 % — ABNORMAL LOW (ref 36.0–46.0)
Hemoglobin: 10.4 g/dL — ABNORMAL LOW (ref 12.0–15.0)
MCH: 24.9 pg — ABNORMAL LOW (ref 26.0–34.0)
MCHC: 30.3 g/dL (ref 30.0–36.0)
MCV: 82.1 fL (ref 80.0–100.0)
Platelets: 495 10*3/uL — ABNORMAL HIGH (ref 150–400)
RBC: 4.18 MIL/uL (ref 3.87–5.11)
RDW: 13 % (ref 11.5–15.5)
WBC: 10.9 10*3/uL — ABNORMAL HIGH (ref 4.0–10.5)
nRBC: 0 % (ref 0.0–0.2)

## 2022-09-29 LAB — LIPASE, BLOOD: Lipase: 34 U/L (ref 11–51)

## 2022-09-29 MED ORDER — ONDANSETRON 8 MG PO TBDP: 8.0000 mg | ORAL_TABLET | Freq: Three times a day (TID) | ORAL | 0 refills | Status: DC | PRN

## 2022-09-29 MED ORDER — IOHEXOL 300 MG/ML  SOLN
80.0000 mL | Freq: Once | INTRAMUSCULAR | Status: AC | PRN
  Administered 2022-09-29: 80 mL via INTRAVENOUS

## 2022-09-29 MED ORDER — LACTATED RINGERS IV SOLN: INTRAVENOUS | Status: DC

## 2022-09-29 MED ORDER — METOCLOPRAMIDE HCL 5 MG/ML IJ SOLN
10.0000 mg | Freq: Once | INTRAMUSCULAR | Status: AC
  Administered 2022-09-29: 10 mg via INTRAVENOUS
  Filled 2022-09-29: qty 2

## 2022-09-29 MED ORDER — PANTOPRAZOLE SODIUM 40 MG PO TBEC: 40.0000 mg | DELAYED_RELEASE_TABLET | Freq: Every day | ORAL | 0 refills | Status: DC

## 2022-09-29 MED ORDER — PANTOPRAZOLE SODIUM 40 MG IV SOLR
40.0000 mg | Freq: Once | INTRAVENOUS | Status: AC
  Administered 2022-09-29: 40 mg via INTRAVENOUS
  Filled 2022-09-29: qty 10

## 2022-09-29 MED ORDER — LACTATED RINGERS IV BOLUS
1000.0000 mL | Freq: Once | INTRAVENOUS | Status: AC
  Administered 2022-09-29: 1000 mL via INTRAVENOUS

## 2022-09-29 MED ORDER — SUCRALFATE 1 G PO TABS: 1.0000 g | ORAL_TABLET | Freq: Four times a day (QID) | ORAL | 0 refills | Status: AC

## 2022-09-29 MED ORDER — DICYCLOMINE HCL 20 MG PO TABS: 20.0000 mg | ORAL_TABLET | Freq: Two times a day (BID) | ORAL | 0 refills | Status: DC

## 2022-09-29 MED ORDER — MORPHINE SULFATE (PF) 4 MG/ML IV SOLN
4.0000 mg | Freq: Once | INTRAVENOUS | Status: AC
  Administered 2022-09-29: 4 mg via INTRAVENOUS
  Filled 2022-09-29: qty 1

## 2022-09-29 MED ORDER — DIPHENHYDRAMINE HCL 50 MG/ML IJ SOLN
12.5000 mg | Freq: Once | INTRAMUSCULAR | Status: AC
  Administered 2022-09-29: 12.5 mg via INTRAVENOUS
  Filled 2022-09-29: qty 1

## 2022-09-29 NOTE — ED Provider Notes (Signed)
[place] EMERGENCY DEPARTMENT AT [facility] Provider Note   CSN: [id number] Arrival date & time: 09/29/22  0756     History  Chief Complaint  Patient presents with   Abdominal Pain   Nausea   Emesis    Amela Handley is a 62 y.o. female.    62 year old female presents with several weeks of nausea vomiting.  Patient had been on [facility] and started having symptoms after she stopped it.  Has been seen by her doctor and no clear etiology has been found.  Patient scheduled to have endoscopy as well as colonoscopy in the near future.  Denies any fever or chills.  Pain is epigastric and radiating to her back.  Has not had any fever or chills.  No urinary symptoms.  Pain has been intermittent and seems to get worse when she tries to eat.  Denies any prior history of peptic ulcer disease       Home Medications Prior to Admission medications   Medication Sig Start Date End Date Taking? Authorizing Provider  albuterol (VENTOLIN HFA) 108 (90 Base) MCG/ACT inhaler Inhale 2 puffs into the lungs every 6 (six) hours as needed for wheezing or shortness of breath. 07/06/22   Tysinger, Kermit Balo, PA-C  aspirin EC 81 MG tablet Take 1 tablet (81 mg total) by mouth daily. 09/06/22   Tysinger, Kermit Balo, PA-C  budesonide-formoterol (SYMBICORT) 160-4.5 MCG/ACT inhaler Inhale 2 puffs into the lungs 2 (two) times daily. 07/06/22   Tysinger, Kermit Balo, PA-C  calcium carbonate (CALCIUM 600) 600 MG TABS tablet Take 1 tablet (600 mg total) by mouth 2 (two) times daily with a meal. 04/13/20   Tysinger, Kermit Balo, PA-C  Continuous Glucose Sensor (FREESTYLE LIBRE 3 SENSOR) MISC 1 Device by Does not apply route every 14 (fourteen) days. 07/16/22   Shamleffer, Konrad Dolores, MD  Continuous Glucose Sensor (FREESTYLE LIBRE 3 SENSOR) MISC 2 each by Does not apply route every 14 (fourteen) days. Place 1 sensor on the skin every 14 days. Use to check glucose continuously 09/05/22   Tysinger, Kermit Balo, PA-C  DULoxetine  (CYMBALTA) 60 MG capsule Take 1 capsule (60 mg total) by mouth 2 (two) times daily. 09/06/22   Tysinger, Kermit Balo, PA-C  fenofibrate (TRICOR) 145 MG tablet Take 1 tablet (145 mg total) by mouth daily. 07/17/22   Shamleffer, Konrad Dolores, MD  ferrous gluconate (FERGON) 324 MG tablet Take 1 tablet (324 mg total) by mouth daily with breakfast. 09/06/22   Tysinger, Kermit Balo, PA-C  FIASP FLEXTOUCH 100 UNIT/ML FlexTouch Pen Inject 36 Units into the skin with breakfast, with lunch, and with evening meal. 09/20/21   Shamleffer, Konrad Dolores, MD  furosemide (LASIX) 80 MG tablet Take 1 tablet (80 mg total) by mouth daily. 09/06/22   Tysinger, Kermit Balo, PA-C  insulin glargine, 2 Unit Dial, ([name] MAX SOLOSTAR) 300 UNIT/ML Solostar Pen INJECT 86 UNITS INTO THE SKIN DAILY AT 6AM 07/29/22   Shamleffer, Konrad Dolores, MD  Insulin Pen Needle (PEN NEEDLES) 32G X 6 MM MISC Use as instructed to inject insulin in the morning, at noon, in the evening, and at bedtime 07/15/20   Shamleffer, Konrad Dolores, MD  losartan (COZAAR) 25 MG tablet Take 1 tablet (25 mg total) by mouth daily. 07/17/22   Shamleffer, Konrad Dolores, MD  metoprolol succinate (TOPROL-XL) 25 MG 24 hr tablet Take 1 tablet (25 mg total) by mouth daily. 09/06/22   Tysinger, Kermit Balo, PA-C  naloxone [facility]) nasal spray 4 mg/0.1  mL  02/21/21   [provider]  nortriptyline (PAMELOR) 75 MG capsule Take 1 capsule by mouth at bedtime 09/14/22   Jones Bales, NP  ondansetron (ZOFRAN) 4 MG tablet Take 1 tablet (4 mg total) by mouth as directed. 09/24/22   Tysinger, Kermit Balo, PA-C  oxyCODONE-acetaminophen (PERCOCET) 7.5-325 MG tablet Take 1 tablet by mouth every 6 (six) hours as needed. 09/17/22   Jones Bales, NP  potassium chloride (KLOR-CON) 10 MEQ tablet Take 1 tablet (10 mEq total) by mouth 2 (two) times daily. 09/06/22   Tysinger, Kermit Balo, PA-C  rosuvastatin (CRESTOR) 20 MG tablet Take 1 tablet (20 mg total) by mouth daily. 07/17/22   Shamleffer, Konrad Dolores, MD  senna (SENOKOT) 8.6 MG tablet Take 1 tablet (8.6 mg total) by mouth daily. 09/17/22   Shamleffer, Konrad Dolores, MD      Allergies    Jardiance [empagliflozin], Ozempic (0.25 or 0.5 mg-dose) [semaglutide(0.25 or 0.5mg -dos)], and Tramadol    Review of Systems   Review of Systems  All other systems reviewed and are negative.   Physical Exam Updated Vital Signs BP 128/86   Pulse 85   Temp 98.1 F (36.7 C) (Oral)   Resp 18   Ht 1.6 m (5\' 3" )   Wt 102.1 kg   LMP 02/09/2017   SpO2 100%   BMI 39.86 kg/m  Physical Exam Vitals and nursing note reviewed.  Constitutional:      General: She is not in acute distress.    Appearance: Normal appearance. She is well-developed. She is not toxic-appearing.  HENT:     Head: Normocephalic and atraumatic.  Eyes:     General: Lids are normal.     Conjunctiva/sclera: Conjunctivae normal.     Pupils: Pupils are equal, round, and reactive to light.  Neck:     Thyroid: No thyroid mass.     Trachea: No tracheal deviation.  Cardiovascular:     Rate and Rhythm: Normal rate and regular rhythm.     Heart sounds: Normal heart sounds. No murmur heard.    No gallop.  Pulmonary:     Effort: Pulmonary effort is normal. No respiratory distress.     Breath sounds: Normal breath sounds. No stridor. No decreased breath sounds, wheezing, rhonchi or rales.  Abdominal:     General: There is no distension.     Palpations: Abdomen is soft.     Tenderness: There is abdominal tenderness in the epigastric area. There is no rebound.  Musculoskeletal:        General: No tenderness. Normal range of motion.     Cervical back: Normal range of motion and neck supple.  Skin:    General: Skin is warm and dry.     Findings: No abrasion or rash.  Neurological:     Mental Status: She is alert and oriented to person, place, and time. Mental status is at baseline.     GCS: GCS eye subscore is 4. GCS verbal subscore is 5. GCS motor subscore is 6.      Cranial Nerves: Cranial nerves are intact. No cranial nerve deficit.     Sensory: No sensory deficit.     Motor: Motor function is intact.  Psychiatric:        Attention and Perception: Attention normal.        Speech: Speech normal.        Behavior: Behavior normal.     ED Results / Procedures / Treatments   Labs (  all labs ordered are listed, but only abnormal results are displayed) Labs Reviewed  LIPASE, BLOOD  COMPREHENSIVE METABOLIC PANEL  CBC  URINALYSIS, ROUTINE W REFLEX MICROSCOPIC    EKG None  Radiology No results found.  Procedures Procedures    Medications Ordered in ED Medications  lactated ringers infusion (has no administration in time range)  lactated ringers bolus 1,000 mL (has no administration in time range)  pantoprazole (PROTONIX) injection 40 mg (has no administration in time range)  metoCLOPramide (REGLAN) injection 10 mg (has no administration in time range)  diphenhydrAMINE (BENADRYL) injection 12.5 mg (has no administration in time range)    ED Course/ Medical Decision Making/ A&P                             Medical Decision Making Amount and/or Complexity of Data Reviewed Labs: ordered. Radiology: ordered.  Risk Prescription drug management.  Patient presented with several weeks of abdominal discomfort with associated emesis.  Patient medicated here with Reglan along with morphine and Benadryl and feels much better.  Also given IV fluids.  Abdominal CT performed to rule out obstruction and per my interpretation shows no evidence of that.Does have some wall thickening of her small bowel loop right lower quadrant which may indicate a mass.  Patient has GI follow-up already scheduled and she was encouraged to keep that.  Will place on Protonix as well as Carafate.         Final Clinical Impression(s) / ED Diagnoses Final diagnoses:  None    Rx / DC Orders ED Discharge Orders     None         Lorre Nick, MD 09/29/22  1212

## 2022-09-29 NOTE — ED Triage Notes (Signed)
Pt arrived via POV/ C/o abd pain  and N/V for 1x months after beginning Mounjaro, pt stopped it 2x weeks ago, and pain has not subsided.  AOx4

## 2022-10-01 ENCOUNTER — Inpatient Hospital Stay: Admission: RE | Admit: 2022-10-01 | Payer: BC Managed Care – PPO | Source: Ambulatory Visit

## 2022-10-01 ENCOUNTER — Ambulatory Visit (HOSPITAL_COMMUNITY)
Admission: RE | Admit: 2022-10-01 | Discharge: 2022-10-01 | Disposition: A | Discharge status: Home / Self Care | Payer: BC Managed Care – PPO | Source: Ambulatory Visit | Attending: Medical | Admitting: Medical

## 2022-10-01 DIAGNOSIS — Z0000 Encounter for general adult medical examination without abnormal findings: Secondary | ICD-10-CM | POA: Diagnosis not present

## 2022-10-01 DIAGNOSIS — R0989 Other specified symptoms and signs involving the circulatory and respiratory systems: Secondary | ICD-10-CM | POA: Diagnosis not present

## 2022-10-01 LAB — VAS US ABI WITH/WO TBI: Right ABI: 1.1

## 2022-10-01 NOTE — Telephone Encounter (Signed)
Pt would like to know what her CT scan showed and what she can do for the pain? She said you can reply mychart to her.

## 2022-10-01 NOTE — Progress Notes (Signed)
Results sent through MyChart

## 2022-10-01 NOTE — Progress Notes (Signed)
Your ABI blood flow screening shows no obvious decrease in blood flow, but there is some noncompressible vessels on the left which could suggest some atherosclerosis

## 2022-10-02 ENCOUNTER — Telehealth: Payer: Self-pay | Admitting: Internal Medicine

## 2022-10-02 ENCOUNTER — Other Ambulatory Visit: Payer: Self-pay | Admitting: Medical

## 2022-10-02 DIAGNOSIS — Z86010 Personal history of colonic polyps: Secondary | ICD-10-CM

## 2022-10-02 DIAGNOSIS — R933 Abnormal findings on diagnostic imaging of other parts of digestive tract: Secondary | ICD-10-CM

## 2022-10-02 NOTE — Telephone Encounter (Signed)
Ok to add egd

## 2022-10-02 NOTE — Telephone Encounter (Signed)
Dr Rhea Belton-  Patient was seen in ER 09/29/22 with complaints of several weeks N/V and abdominal pain. States she had started [facility] but later stopped due to the N/V.  She is currently scheduled for a recall colon on 10/08/22. She states she was told she needed an endoscopy in addition due to her symptoms. Please also see recent CT from ER visit for additional information.   Please advise, would you like me to add direct endoscopy on to already scheduled colonoscopy or would you need to see her in the office first?

## 2022-10-02 NOTE — Progress Notes (Signed)
See other MyChart message reply from a today.  I spoke to same.  I was going to send pain medicine out to help her pain.  I did send her some information through MyChart.  However when I looked at the chart she has 120 pain pills just recently dispensed by her pain doctor so she has strong pain medicine already to use as needed

## 2022-10-02 NOTE — Telephone Encounter (Signed)
Inbound call from patient stating she was recently in the ED. States she was advised to have a endoscopy along with her colonoscopy scheduled for 7/29. Requesting a call back to discuss further and how she should proceed. Please advise, thank you.

## 2022-10-03 ENCOUNTER — Encounter: Payer: Self-pay | Admitting: Internal Medicine

## 2022-10-03 NOTE — Telephone Encounter (Signed)
I have spoken to patient to advise that endoscopy has been added to her already scheduled colonoscopy on 10/08/22. Patient verbalizes understanding.

## 2022-10-08 ENCOUNTER — Ambulatory Visit (AMBULATORY_SURGERY_CENTER): Payer: BC Managed Care – PPO | Admitting: Internal Medicine

## 2022-10-08 ENCOUNTER — Encounter: Payer: Self-pay | Admitting: Internal Medicine

## 2022-10-08 ENCOUNTER — Telehealth: Payer: Self-pay

## 2022-10-08 VITALS — BP 106/58 | HR 68 | Temp 97.5°F | Resp 16 | Ht 63.5 in | Wt 225.0 lb

## 2022-10-08 DIAGNOSIS — R1013 Epigastric pain: Secondary | ICD-10-CM | POA: Diagnosis not present

## 2022-10-08 DIAGNOSIS — D123 Benign neoplasm of transverse colon: Secondary | ICD-10-CM

## 2022-10-08 DIAGNOSIS — K2971 Gastritis, unspecified, with bleeding: Secondary | ICD-10-CM

## 2022-10-08 DIAGNOSIS — Z09 Encounter for follow-up examination after completed treatment for conditions other than malignant neoplasm: Secondary | ICD-10-CM | POA: Diagnosis present

## 2022-10-08 DIAGNOSIS — Z86010 Personal history of colonic polyps: Secondary | ICD-10-CM

## 2022-10-08 DIAGNOSIS — K2970 Gastritis, unspecified, without bleeding: Secondary | ICD-10-CM

## 2022-10-08 DIAGNOSIS — R112 Nausea with vomiting, unspecified: Secondary | ICD-10-CM | POA: Diagnosis not present

## 2022-10-08 MED ORDER — SODIUM CHLORIDE 0.9 % IV SOLN: 500.0000 mL | Freq: Once | INTRAVENOUS | Status: DC

## 2022-10-08 MED ORDER — FLEET ENEMA 7-19 GM/118ML RE ENEM
1.0000 | ENEMA | Freq: Once | RECTAL | Status: AC
Start: 2022-10-08 — End: 2022-10-08
  Administered 2022-10-08: 1 via RECTAL

## 2022-10-08 MED ORDER — PANTOPRAZOLE SODIUM 40 MG PO TBEC: 40.0000 mg | DELAYED_RELEASE_TABLET | Freq: Every day | ORAL | 0 refills | Status: DC

## 2022-10-08 MED ORDER — SODIUM CHLORIDE 0.9 % IV SOLN
4.0000 mg | Freq: Once | INTRAVENOUS | Status: AC
Start: 2022-10-08 — End: 2022-10-08
  Administered 2022-10-08: 4 mg via INTRAVENOUS

## 2022-10-08 MED ORDER — DICYCLOMINE HCL 10 MG PO CAPS: 20.0000 mg | ORAL_CAPSULE | Freq: Three times a day (TID) | ORAL | 2 refills | Status: DC | PRN

## 2022-10-08 NOTE — Progress Notes (Signed)
Called to room to assist during endoscopic procedure.  Patient ID and intended procedure confirmed with present staff. Received instructions for my participation in the procedure from the performing physician.  

## 2022-10-08 NOTE — Progress Notes (Signed)
GASTROENTEROLOGY PROCEDURE H&P NOTE   Primary Care Physician: Jac Canavan, PA-C    Reason for Procedure:  Nausea, vomiting and abdominal pain; history of adenomatous colon polyp  Plan:    EGD and colonoscopy  Patient is appropriate for endoscopic procedure(s) in the ambulatory ([facility]) setting.  The nature of the procedure, as well as the risks, benefits, and alternatives were carefully and thoroughly reviewed with the patient. Ample time for discussion and questions allowed. The patient understood, was satisfied, and agreed to proceed.     HPI: [name] is a 62 y.o. female who presents for EGD and colonoscopy.  Medical history as below.  Tolerated the prep.  No recent chest pain or shortness of breath.  No abdominal pain today.  Past Medical History:  Diagnosis Date   Abscess    increased drainage from abscess on buttock   Anal fistula    Anxiety    Bilateral hip pain 05/27/2015   Chronic pain syndrome 05/27/2015   Depression    sees Dr. Sherlyn Lick   Diabetes mellitus without complication [facility])    Diverticulosis    Elevated alkaline phosphatase level    Fatty liver    Gallstones    Hiatal hernia    Hyperlipidemia    Hypertension    Internal hemorrhoids    Sleep apnea    2008- sleep study, neg. for sleep apnea    Stroke [facility])    SVT (supraventricular tachycardia)    Symptomatic cholelithiasis 09/11/2018    Past Surgical History:  Procedure Laterality Date   ANAL EXAMINATION UNDER ANESTHESIA  02/21/2011   anal fistula   BREAST EXCISIONAL BIOPSY Left    BREAST SURGERY  patient does not remember date of procedure   pull fluid off lft br   CHOLECYSTECTOMY N/A 09/12/2018   Procedure: LAPAROSCOPIC CHOLECYSTECTOMY;  Surgeon: Almond Lint, MD;  Location: [facility] OR;  Service: General;  Laterality: N/A;   ELECTROPHYSIOLOGIC STUDY N/A 05/05/2015   Procedure: SVT Ablation;  Surgeon: Will Jorja Loa, MD;  Location: [facility] INVASIVE CV LAB;  Service: Cardiovascular;   Laterality: N/A;   EP IMPLANTABLE DEVICE N/A 01/30/2016   Procedure: Loop Recorder Insertion;  Surgeon: Thurmon Fair, MD;  Location: [facility] INVASIVE CV LAB;  Service: Cardiovascular;  Laterality: N/A;   INCISE AND DRAIN ABCESS     abscess on right thigh and buttock   KNEE ARTHROSCOPY     left   LAPAROSCOPIC APPENDECTOMY N/A 09/19/2018   Procedure: APPENDECTOMY LAPAROSCOPIC;  Surgeon: Berna Bue, MD;  Location: [facility] OR;  Service: General;  Laterality: N/A;   SHOULDER SURGERY  04/14/2009   right    Prior to Admission medications   Medication Sig Start Date End Date Taking? Authorizing Provider  albuterol (VENTOLIN HFA) 108 (90 Base) MCG/ACT inhaler Inhale 2 puffs into the lungs every 6 (six) hours as needed for wheezing or shortness of breath. 07/06/22  Yes Tysinger, Kermit Balo, PA-C  aspirin EC 81 MG tablet Take 1 tablet (81 mg total) by mouth daily. 09/06/22  Yes Tysinger, Kermit Balo, PA-C  calcium carbonate (CALCIUM 600) 600 MG TABS tablet Take 1 tablet (600 mg total) by mouth 2 (two) times daily with a meal. 04/13/20  Yes Tysinger, Kermit Balo, PA-C  Continuous Glucose Sensor (FREESTYLE LIBRE 3 SENSOR) MISC 1 Device by Does not apply route every 14 (fourteen) days. 07/16/22  Yes Shamleffer, Konrad Dolores, MD  Continuous Glucose Sensor (FREESTYLE LIBRE 3 SENSOR) MISC 2 each by Does not apply route every 14 (  fourteen) days. Place 1 sensor on the skin every 14 days. Use to check glucose continuously 09/05/22  Yes Tysinger, Kermit Balo, PA-C  dicyclomine (BENTYL) 20 MG tablet Take 1 tablet (20 mg total) by mouth 2 (two) times daily. 09/29/22  Yes Lorre Nick, MD  DULoxetine (CYMBALTA) 60 MG capsule Take 1 capsule (60 mg total) by mouth 2 (two) times daily. 09/06/22  Yes Tysinger, Kermit Balo, PA-C  fenofibrate (TRICOR) 145 MG tablet Take 1 tablet (145 mg total) by mouth daily. 07/17/22  Yes Shamleffer, Konrad Dolores, MD  ferrous gluconate (FERGON) 324 MG tablet Take 1 tablet (324 mg total) by mouth daily with  breakfast. 09/06/22  Yes Tysinger, Kermit Balo, PA-C  FIASP FLEXTOUCH 100 UNIT/ML FlexTouch Pen Inject 36 Units into the skin with breakfast, with lunch, and with evening meal. 09/20/21  Yes Shamleffer, Konrad Dolores, MD  furosemide (LASIX) 80 MG tablet Take 1 tablet (80 mg total) by mouth daily. 09/06/22  Yes Tysinger, Kermit Balo, PA-C  insulin glargine, 2 Unit Dial, ([name] MAX SOLOSTAR) 300 UNIT/ML Solostar Pen INJECT 86 UNITS INTO THE SKIN DAILY AT 6AM 07/29/22  Yes Shamleffer, Konrad Dolores, MD  Insulin Pen Needle (PEN NEEDLES) 32G X 6 MM MISC Use as instructed to inject insulin in the morning, at noon, in the evening, and at bedtime 07/15/20  Yes Shamleffer, Konrad Dolores, MD  losartan (COZAAR) 25 MG tablet Take 1 tablet (25 mg total) by mouth daily. 07/17/22  Yes Shamleffer, Konrad Dolores, MD  metoprolol succinate (TOPROL-XL) 25 MG 24 hr tablet Take 1 tablet (25 mg total) by mouth daily. 09/06/22  Yes Tysinger, Kermit Balo, PA-C  nortriptyline (PAMELOR) 75 MG capsule Take 1 capsule by mouth at bedtime 09/14/22  Yes Jacalyn Lefevre L, NP  ondansetron (ZOFRAN) 4 MG tablet Take 1 tablet (4 mg total) by mouth as directed. 09/24/22  Yes Tysinger, Kermit Balo, PA-C  oxyCODONE-acetaminophen (PERCOCET) 7.5-325 MG tablet Take 1 tablet by mouth every 6 (six) hours as needed. 09/17/22  Yes Jones Bales, NP  pantoprazole (PROTONIX) 40 MG tablet Take 1 tablet (40 mg total) by mouth daily. 09/29/22  Yes Lorre Nick, MD  potassium chloride (KLOR-CON) 10 MEQ tablet Take 1 tablet (10 mEq total) by mouth 2 (two) times daily. 09/06/22  Yes Tysinger, Kermit Balo, PA-C  rosuvastatin (CRESTOR) 20 MG tablet Take 1 tablet (20 mg total) by mouth daily. 07/17/22  Yes Shamleffer, Konrad Dolores, MD  senna (SENOKOT) 8.6 MG tablet Take 1 tablet (8.6 mg total) by mouth daily. 09/17/22  Yes Shamleffer, Konrad Dolores, MD  sucralfate (CARAFATE) 1 g tablet Take 1 tablet (1 g total) by mouth 4 (four) times daily. 09/29/22  Yes Lorre Nick, MD   budesonide-formoterol [facility]) 160-4.5 MCG/ACT inhaler Inhale 2 puffs into the lungs 2 (two) times daily. 07/06/22   Tysinger, Kermit Balo, PA-C  naloxone [facility]) nasal spray 4 mg/0.1 mL  02/21/21   [provider]  ondansetron (ZOFRAN-ODT) 8 MG disintegrating tablet Take 1 tablet (8 mg total) by mouth every 8 (eight) hours as needed for nausea or vomiting. 09/29/22   Lorre Nick, MD    Current Outpatient Medications  Medication Sig Dispense Refill   albuterol (VENTOLIN HFA) 108 (90 Base) MCG/ACT inhaler Inhale 2 puffs into the lungs every 6 (six) hours as needed for wheezing or shortness of breath. 8 g 1   aspirin EC 81 MG tablet Take 1 tablet (81 mg total) by mouth daily. 90 tablet 3   calcium carbonate (CALCIUM 600)  600 MG TABS tablet Take 1 tablet (600 mg total) by mouth 2 (two) times daily with a meal. 180 tablet 3   Continuous Glucose Sensor (FREESTYLE LIBRE 3 SENSOR) MISC 1 Device by Does not apply route every 14 (fourteen) days. 6 each 3   Continuous Glucose Sensor (FREESTYLE LIBRE 3 SENSOR) MISC 2 each by Does not apply route every 14 (fourteen) days. Place 1 sensor on the skin every 14 days. Use to check glucose continuously 2 each 11   dicyclomine (BENTYL) 20 MG tablet Take 1 tablet (20 mg total) by mouth 2 (two) times daily. 20 tablet 0   DULoxetine (CYMBALTA) 60 MG capsule Take 1 capsule (60 mg total) by mouth 2 (two) times daily. 180 capsule 3   fenofibrate (TRICOR) 145 MG tablet Take 1 tablet (145 mg total) by mouth daily. 90 tablet 3   ferrous gluconate (FERGON) 324 MG tablet Take 1 tablet (324 mg total) by mouth daily with breakfast. 90 tablet 3   FIASP FLEXTOUCH 100 UNIT/ML FlexTouch Pen Inject 36 Units into the skin with breakfast, with lunch, and with evening meal. 105 mL 1   furosemide (LASIX) 80 MG tablet Take 1 tablet (80 mg total) by mouth daily. 90 tablet 3   insulin glargine, 2 Unit Dial, ([name] MAX SOLOSTAR) 300 UNIT/ML Solostar Pen INJECT 86 UNITS INTO THE  SKIN DAILY AT 6AM 30 mL 0   Insulin Pen Needle (PEN NEEDLES) 32G X 6 MM MISC Use as instructed to inject insulin in the morning, at noon, in the evening, and at bedtime 400 each 3   losartan (COZAAR) 25 MG tablet Take 1 tablet (25 mg total) by mouth daily. 90 tablet 3   metoprolol succinate (TOPROL-XL) 25 MG 24 hr tablet Take 1 tablet (25 mg total) by mouth daily. 90 tablet 3   nortriptyline (PAMELOR) 75 MG capsule Take 1 capsule by mouth at bedtime 90 capsule 0   ondansetron (ZOFRAN) 4 MG tablet Take 1 tablet (4 mg total) by mouth as directed. 20 tablet 0   oxyCODONE-acetaminophen (PERCOCET) 7.5-325 MG tablet Take 1 tablet by mouth every 6 (six) hours as needed. 120 tablet 0   pantoprazole (PROTONIX) 40 MG tablet Take 1 tablet (40 mg total) by mouth daily. 30 tablet 0   potassium chloride (KLOR-CON) 10 MEQ tablet Take 1 tablet (10 mEq total) by mouth 2 (two) times daily. 180 tablet 3   rosuvastatin (CRESTOR) 20 MG tablet Take 1 tablet (20 mg total) by mouth daily. 90 tablet 3   senna (SENOKOT) 8.6 MG tablet Take 1 tablet (8.6 mg total) by mouth daily. 30 tablet 0   sucralfate (CARAFATE) 1 g tablet Take 1 tablet (1 g total) by mouth 4 (four) times daily. 30 tablet 0   budesonide-formoterol (SYMBICORT) 160-4.5 MCG/ACT inhaler Inhale 2 puffs into the lungs 2 (two) times daily. 1 each 5   naloxone (NARCAN) nasal spray 4 mg/0.1 mL      ondansetron (ZOFRAN-ODT) 8 MG disintegrating tablet Take 1 tablet (8 mg total) by mouth every 8 (eight) hours as needed for nausea or vomiting. 20 tablet 0   Current Facility-Administered Medications  Medication Dose Route Frequency Provider Last Rate Last Admin   0.9 %  sodium chloride infusion  500 mL Intravenous Once Laiza Veenstra, Carie Caddy, MD       ondansetron [facility]) 4 mg in sodium chloride 0.9 % 50 mL IVPB  4 mg Intravenous Once Beverley Fiedler, MD 208 mL/hr at 10/08/22 0953 4 mg  at 10/08/22 0953    Allergies as of 10/08/2022 - Review Complete 10/08/2022  Allergen  Reaction Noted   Jardiance [empagliflozin] Other (See Comments) 07/16/2022   Mounjaro [tirzepatide] Other (See Comments) 10/08/2022   Ozempic (0.25 or 0.5 mg-dose) [semaglutide(0.25 or 0.5mg -dos)]  06/24/2019   Tramadol Nausea Only 10/30/2010    Family History  Problem Relation Age of Onset   Hypertension Mother    Alzheimer's disease Mother    Diabetes Father    Breast cancer Sister 61   Anesthesia problems Neg Hx    Hypotension Neg Hx    Malignant hyperthermia Neg Hx    Pseudochol deficiency Neg Hx    Colon cancer Neg Hx    Esophageal cancer Neg Hx    Stomach cancer Neg Hx    Rectal cancer Neg Hx    Thyroid disease Neg Hx     Social History   Socioeconomic History   Marital status: Divorced    Spouse name: Not on file   Number of children: 0   Years of education: Not on file   Highest education level: Not on file  Occupational History   Occupation: TEACHER ASSISTANT  Tobacco Use   Smoking status: Never   Smokeless tobacco: Never  Vaping Use   Vaping status: Never Used  Substance and Sexual Activity   Alcohol use: No   Drug use: No   Sexual activity: Yes  Other Topics Concern   Not on file  Social History Narrative   Not on file   Social Determinants of Health   Financial Resource Strain: Not on file  Food Insecurity: Food Insecurity Present (09/03/2019)   Hunger Vital Sign    Worried About Running Out of Food in the Last Year: Sometimes true    Ran Out of Food in the Last Year: Sometimes true  Transportation Needs: No Transportation Needs (09/03/2019)   PRAPARE - Administrator, Civil Service (Medical): No    Lack of Transportation (Non-Medical): No  Physical Activity: Not on file  Stress: Not on file  Social Connections: Not on file  Intimate Partner Violence: Not on file    Physical Exam: Vital signs in last 24 hours: @BP  (!) 132/50   Pulse 82   Temp (!) 97.5 F (36.4 C) (Temporal)   Ht 5' 3.5" (1.613 m)   Wt 225 lb (102.1 kg)    LMP 02/09/2017   SpO2 100%   BMI 39.23 kg/m  GEN: NAD EYE: Sclerae anicteric ENT: MMM CV: Non-tachycardic Pulm: CTA b/l GI: Soft, NT/ND NEURO:  Alert & Oriented x 3   Erick Blinks, MD [facility]  10/08/2022 10:06 AM

## 2022-10-08 NOTE — Progress Notes (Signed)
VS completed by CW.   Pt's states no medical or surgical changes since previsit or office visit.  

## 2022-10-08 NOTE — Patient Instructions (Addendum)
- Return to normal activities tomorrow.  - Written discharge instructions were provided to the patient. - Resume previous diet. - Continue present medications. - Await pathology results. - Repeat colonoscopy is recommended for surveillance. The colonoscopy date will be determined after pathology results from today's exam become available for review.   YOU HAD AN ENDOSCOPIC PROCEDURE TODAY AT THE [facility]:   Refer to the procedure report that was given to you for any specific questions about what was found during the examination.  If the procedure report does not answer your questions, please call your gastroenterologist to clarify.  If you requested that your care partner not be given the details of your procedure findings, then the procedure report has been included in a sealed envelope for you to review at your convenience later.  YOU SHOULD EXPECT: Some feelings of bloating in the abdomen. Passage of more gas than usual.  Walking can help get rid of the air that was put into your GI tract during the procedure and reduce the bloating. If you had a lower endoscopy (such as a colonoscopy or flexible sigmoidoscopy) you may notice spotting of blood in your stool or on the toilet paper. If you underwent a bowel prep for your procedure, you may not have a normal bowel movement for a few days.  Please Note:  You might notice some irritation and congestion in your nose or some drainage.  This is from the oxygen used during your procedure.  There is no need for concern and it should clear up in a day or so.  SYMPTOMS TO REPORT IMMEDIATELY:  Following lower endoscopy (colonoscopy or flexible sigmoidoscopy):  Excessive amounts of blood in the stool  Significant tenderness or worsening of abdominal pains  Swelling of the abdomen that is new, acute  Fever of 100F or higher  Following upper endoscopy (EGD)  Vomiting of blood or coffee ground material  New chest pain or pain under the  shoulder blades  Painful or persistently difficult swallowing  New shortness of breath  Fever of 100F or higher  Black, tarry-looking stools  For urgent or emergent issues, a gastroenterologist can be reached at any hour by calling [phone]. Do not use MyChart messaging for urgent concerns.    DIET:  We do recommend a small meal at first, but then you may proceed to your regular diet.  Drink plenty of fluids but you should avoid alcoholic beverages for 24 hours.  ACTIVITY:  You should plan to take it easy for the rest of today and you should NOT DRIVE or use heavy machinery until tomorrow (because of the sedation medicines used during the test).    FOLLOW UP: Our staff will call the number listed on your records the next business day following your procedure.  We will call around 7:15- 8:00 am to check on you and address any questions or concerns that you may have regarding the information given to you following your procedure. If we do not reach you, we will leave a message.     If any biopsies were taken you will be contacted by phone or by letter within the next 1-3 weeks.  Please call us at [phone] if you have not heard about the biopsies in 3 weeks.    SIGNATURES/CONFIDENTIALITY: You and/or your care partner have signed paperwork which will be entered into your electronic medical record.  These signatures attest to the fact that that the information above on your After Visit Summary  has been reviewed and is understood.  Full responsibility of the confidentiality of this discharge information lies with you and/or your care-partner.

## 2022-10-08 NOTE — Op Note (Signed)
[facility] Patient Name: [name] Procedure Date: 10/08/2022 9:58 AM MRN: [id number] Endoscopist: Beverley Fiedler , MD, [id number] Age: 62 Referring MD:  Date of Birth: December 08, 1960 Gender: Female Account #: [id number] Procedure:                Colonoscopy Indications:              High risk colon cancer surveillance: Personal                            history of non-advanced adenoma, Last colonoscopy:                            July 2018 Medicines:                Monitored Anesthesia Care Procedure:                Pre-Anesthesia Assessment:                           - Prior to the procedure, a History and Physical                            was performed, and patient medications and                            allergies were reviewed. The patient's tolerance of                            previous anesthesia was also reviewed. The risks                            and benefits of the procedure and the sedation                            options and risks were discussed with the patient.                            All questions were answered, and informed consent                            was obtained. Prior Anticoagulants: The patient has                            taken no anticoagulant or antiplatelet agents. ASA                            Grade Assessment: III - A patient with severe                            systemic disease. After reviewing the risks and                            benefits, the patient was deemed in satisfactory  condition to undergo the procedure.                           After obtaining informed consent, the colonoscope                            was passed under direct vision. Throughout the                            procedure, the patient's blood pressure, pulse, and                            oxygen saturations were monitored continuously. The                            CF [id number] #[id number] was introduced through  the anus                            and advanced to the cecum, identified by                            appendiceal orifice and ileocecal valve. The                            colonoscopy was performed without difficulty. The                            patient tolerated the procedure well. The quality                            of the bowel preparation was adequate (after                            copious irrigation and lavage + 2 day prep MiraLax                            + Suprep). The ileocecal valve, appendiceal                            orifice, and rectum were photographed. Scope In: 10:23:26 AM Scope Out: 10:46:38 AM Scope Withdrawal Time: 0 hours 18 minutes 41 seconds  Total Procedure Duration: 0 hours 23 minutes 12 seconds  Findings:                 The digital rectal exam was normal.                           A 5 mm polyp was found in the transverse colon. The                            polyp was sessile. The polyp was removed with a                            cold snare. Resection and retrieval were  complete.                           Multiple medium-mouthed and small-mouthed                            diverticula were found in the sigmoid colon.                           Internal hemorrhoids were found during                            retroflexion. The hemorrhoids were small. Complications:            No immediate complications. Estimated Blood Loss:     Estimated blood loss: none. Impression:               - One 5 mm polyp in the transverse colon, removed                            with a cold snare. Resected and retrieved.                           - Mild diverticulosis in the sigmoid colon.                           - Internal hemorrhoids. Recommendation:           - Patient has a contact number available for                            emergencies. The signs and symptoms of potential                            delayed complications were discussed with the                             patient. Return to normal activities tomorrow.                            Written discharge instructions were provided to the                            patient.                           - Resume previous diet.                           - Continue present medications.                           - Await pathology results.                           - Repeat colonoscopy is recommended for  surveillance. The colonoscopy date will be                            determined after pathology results from today's                            exam become available for review. Beverley Fiedler, MD 10/08/2022 10:57:38 AM This report has been signed electronically.

## 2022-10-08 NOTE — Progress Notes (Signed)
Uneventful anesthetic. Report to pacu rn. Vss. Care resumed by rn. 

## 2022-10-08 NOTE — Op Note (Signed)
[facility] Patient Name: [name] Procedure Date: 10/08/2022 10:02 AM MRN: [id number] Endoscopist: Beverley Fiedler , MD, [id number] Age: 62 Referring MD:  Date of Birth: December 07, 1960 Gender: Female Account #: [id number] Procedure:                Upper GI endoscopy Indications:              Epigastric abdominal pain, Generalized abdominal                            pain, Nausea with vomiting -- most starting after                            several doses of GLP-1 medication though persisting                            despite discontinuing med x 4 weeks Medicines:                Monitored Anesthesia Care Procedure:                Pre-Anesthesia Assessment:                           - Prior to the procedure, a History and Physical                            was performed, and patient medications and                            allergies were reviewed. The patient's tolerance of                            previous anesthesia was also reviewed. The risks                            and benefits of the procedure and the sedation                            options and risks were discussed with the patient.                            All questions were answered, and informed consent                            was obtained. Prior Anticoagulants: The patient has                            taken no anticoagulant or antiplatelet agents. ASA                            Grade Assessment: III - A patient with severe                            systemic disease. After reviewing the risks and  benefits, the patient was deemed in satisfactory                            condition to undergo the procedure.                           After obtaining informed consent, the endoscope was                            passed under direct vision. Throughout the                            procedure, the patient's blood pressure, pulse, and                            oxygen  saturations were monitored continuously. The                            Olympus Scope [id number] was introduced through the                            mouth, and advanced to the second part of duodenum.                            The upper GI endoscopy was accomplished without                            difficulty. The patient tolerated the procedure                            well. Scope In: Scope Out: Findings:                 The examined esophagus was normal.                           A 2 cm hiatal hernia was present.                           Mild inflammation characterized by erosions,                            erythema and granularity was found in the gastric                            antrum. Biopsies were taken with a cold forceps for                            histology and Helicobacter pylori testing.                           The examined duodenum was normal. Complications:            No immediate complications. Estimated Blood Loss:     Estimated blood loss was minimal. Impression:               -  Normal esophagus.                           - 2 cm hiatal hernia.                           - Gastritis. Biopsied.                           - Normal examined duodenum. Recommendation:           - Patient has a contact number available for                            emergencies. The signs and symptoms of potential                            delayed complications were discussed with the                            patient. Return to normal activities tomorrow.                            Written discharge instructions were provided to the                            patient.                           - Resume previous diet.                           - Continue present medications.                           - Await pathology results.                           - See the other procedure note for documentation of                            additional recommendations. Beverley Fiedler,  MD 10/08/2022 10:54:36 AM This report has been signed electronically.

## 2022-10-08 NOTE — Telephone Encounter (Signed)
Reminder entered in epic for CT enter of abd in 3 mths.

## 2022-10-08 NOTE — Telephone Encounter (Signed)
-----   Message from Carie Caddy Pyrtle sent at 10/08/2022 11:19 AM EDT ----- Abnl ct small bowel recently in ER Can we repeat this ct in 3 months (ct enterography to followup abnl CT small bowel) Thanks JMP

## 2022-10-09 ENCOUNTER — Telehealth: Payer: Self-pay

## 2022-10-09 NOTE — Telephone Encounter (Signed)
  Follow up Call-     10/08/2022    9:29 AM  Call back number  Post procedure Call Back phone  # [phone]  Permission to leave phone message Yes     Patient questions:  Do you have a fever, pain , or abdominal swelling? No. Pain Score  0 *  Have you tolerated food without any problems? Yes.    Have you been able to return to your normal activities? Yes.    Do you have any questions about your discharge instructions: Diet   No. Medications  No. Follow up visit  No.  Do you have questions or concerns about your Care? no  Actions: * If pain score is 4 or above: No action needed, pain <4.

## 2022-10-10 ENCOUNTER — Encounter: Payer: Self-pay | Admitting: Internal Medicine

## 2022-10-15 ENCOUNTER — Encounter: Payer: Self-pay | Admitting: Internal Medicine

## 2022-10-15 ENCOUNTER — Telehealth: Payer: Self-pay | Admitting: *Deleted

## 2022-10-15 NOTE — Telephone Encounter (Signed)
Called patient to inform of letters sent to her via Dr. Margretta Sidle. The letters informed patient of the results noted during both her colonoscopy and endoscopy procedures. Both letters were read to patient and patient was also informed both letters were in the mail and she should be receiving soon. Patient was concerned about not having a bowel movement and wanted to know what she could use to aid in having a BM. Suggested to the patient using Miralax OTC medication; using up to 3 doses daily until she has a BM. Patient understood and agreed. Also informed the patient to call if anything unusual is noted with her bowel habits.

## 2022-11-15 ENCOUNTER — Encounter: Payer: Self-pay | Admitting: Registered Nurse

## 2022-11-15 ENCOUNTER — Encounter: Payer: BC Managed Care – PPO | Attending: Registered Nurse | Admitting: Registered Nurse

## 2022-11-15 VITALS — BP 118/63 | HR 90 | Ht 63.5 in | Wt 228.0 lb

## 2022-11-15 DIAGNOSIS — M7062 Trochanteric bursitis, left hip: Secondary | ICD-10-CM | POA: Insufficient documentation

## 2022-11-15 DIAGNOSIS — Z5181 Encounter for therapeutic drug level monitoring: Secondary | ICD-10-CM | POA: Diagnosis present

## 2022-11-15 DIAGNOSIS — M7061 Trochanteric bursitis, right hip: Secondary | ICD-10-CM | POA: Insufficient documentation

## 2022-11-15 DIAGNOSIS — G894 Chronic pain syndrome: Secondary | ICD-10-CM | POA: Insufficient documentation

## 2022-11-15 DIAGNOSIS — Z79891 Long term (current) use of opiate analgesic: Secondary | ICD-10-CM | POA: Diagnosis present

## 2022-11-15 DIAGNOSIS — M5416 Radiculopathy, lumbar region: Secondary | ICD-10-CM | POA: Insufficient documentation

## 2022-11-15 DIAGNOSIS — M47817 Spondylosis without myelopathy or radiculopathy, lumbosacral region: Secondary | ICD-10-CM | POA: Insufficient documentation

## 2022-11-15 MED ORDER — OXYCODONE-ACETAMINOPHEN 7.5-325 MG PO TABS: 1.0000 | ORAL_TABLET | Freq: Four times a day (QID) | ORAL | 0 refills | Status: DC | PRN

## 2022-11-15 NOTE — Progress Notes (Signed)
Subjective:    Patient ID: [name], female    DOB: 1960-05-12, 62 y.o.   MRN: [id number]  HPI: [name] is a 62 y.o. female who returns for follow up appointment for chronic pain and medication refill. She states her pain is located in her lower back radiating into her bilateral hips. She rates her pain 6. Her current exercise regime is walking and performing stretching exercises.  [name] Morphine equivalent is 45.00 MME.   UDS ordered today.    Pain Inventory Average Pain 7 Pain Right Now 6 My pain is sharp, dull, and stabbing  In the last 24 hours, has pain interfered with the following? General activity 9 Relation with others 9 Enjoyment of life 10 What TIME of day is your pain at its worst? daytime and evening Sleep (in general) Fair  Pain is worse with: walking, standing, and some activites Pain improves with: rest, heat/ice, and medication Relief from Meds: 7  Family History  Problem Relation Age of Onset  . Hypertension Mother   . Alzheimer's disease Mother   . Diabetes Father   . Breast cancer Sister 33  . Anesthesia problems Neg Hx   . Hypotension Neg Hx   . Malignant hyperthermia Neg Hx   . Pseudochol deficiency Neg Hx   . Colon cancer Neg Hx   . Esophageal cancer Neg Hx   . Stomach cancer Neg Hx   . Rectal cancer Neg Hx   . Thyroid disease Neg Hx    Social History   Socioeconomic History  . Marital status: Divorced    Spouse name: Not on file  . Number of children: 0  . Years of education: Not on file  . Highest education level: Not on file  Occupational History  . Occupation: TEACHER ASSISTANT  Tobacco Use  . Smoking status: Never  . Smokeless tobacco: Never  Vaping Use  . Vaping status: Never Used  Substance and Sexual Activity  . Alcohol use: No  . Drug use: No  . Sexual activity: Yes  Other Topics Concern  . Not on file  Social History Narrative  . Not on file   Social Determinants of Health   Financial  Resource Strain: Not on file  Food Insecurity: Food Insecurity Present (09/03/2019)   Hunger Vital Sign   . Worried About Programme researcher, broadcasting/film/video in the Last Year: Sometimes true   . Ran Out of Food in the Last Year: Sometimes true  Transportation Needs: No Transportation Needs (09/03/2019)   PRAPARE - Transportation   . Lack of Transportation (Medical): No   . Lack of Transportation (Non-Medical): No  Physical Activity: Not on file  Stress: Not on file  Social Connections: Not on file   Past Surgical History:  Procedure Laterality Date  . ANAL EXAMINATION UNDER ANESTHESIA  02/21/2011   anal fistula  . BREAST EXCISIONAL BIOPSY Left   . BREAST SURGERY  patient does not remember date of procedure   pull fluid off lft br  . CHOLECYSTECTOMY N/A 09/12/2018   Procedure: LAPAROSCOPIC CHOLECYSTECTOMY;  Surgeon: Almond Lint, MD;  Location: [facility] OR;  Service: General;  Laterality: N/A;  . COLONOSCOPY  10/08/2016   2 day prep 1 TA,  . ELECTROPHYSIOLOGIC STUDY N/A 05/05/2015   Procedure: SVT Ablation;  Surgeon: Will Jorja Loa, MD;  Location: [facility];  Service: Cardiovascular;  Laterality: N/A;  . EP IMPLANTABLE DEVICE N/A 01/30/2016   Procedure: Loop Recorder Insertion;  Surgeon: Thurmon Fair, MD;  Location: [facility];  Service: Cardiovascular;  Laterality: N/A;  . INCISE AND DRAIN ABCESS     abscess on right thigh and buttock  . KNEE ARTHROSCOPY     left  . LAPAROSCOPIC APPENDECTOMY N/A 09/19/2018   Procedure: APPENDECTOMY LAPAROSCOPIC;  Surgeon: Berna Bue, MD;  Location: [facility] OR;  Service: General;  Laterality: N/A;  . SHOULDER SURGERY  04/14/2009   right   Past Surgical History:  Procedure Laterality Date  . ANAL EXAMINATION UNDER ANESTHESIA  02/21/2011   anal fistula  . BREAST EXCISIONAL BIOPSY Left   . BREAST SURGERY  patient does not remember date of procedure   pull fluid off lft br  . CHOLECYSTECTOMY N/A 09/12/2018   Procedure: LAPAROSCOPIC  CHOLECYSTECTOMY;  Surgeon: Almond Lint, MD;  Location: [facility];  Service: General;  Laterality: N/A;  . COLONOSCOPY  10/08/2016   2 day prep 1 TA,  . ELECTROPHYSIOLOGIC STUDY N/A 05/05/2015   Procedure: SVT Ablation;  Surgeon: Will Jorja Loa, MD;  [facility]: [facility];  Service: Cardiovascular;  Laterality: N/A;  . EP IMPLANTABLE DEVICE N/A 01/30/2016   Procedure: Loop Recorder Insertion;  Surgeon: Thurmon Fair, MD;  [facility]: [facility];  Service: Cardiovascular;  Laterality: N/A;  . INCISE AND DRAIN ABCESS     abscess on right thigh and buttock  . KNEE ARTHROSCOPY     left  . LAPAROSCOPIC APPENDECTOMY N/A 09/19/2018   Procedure: APPENDECTOMY LAPAROSCOPIC;  Surgeon: Berna Bue, MD;  Location: [facility];  Service: General;  Laterality: N/A;  . SHOULDER SURGERY  04/14/2009   right   Past Medical History:  Diagnosis Date  . Abscess    increased drainage from abscess on buttock  . Anal fistula   . Anxiety   . Bilateral hip pain 05/27/2015  . Chronic pain syndrome 05/27/2015  . Depression    sees Dr. Sherlyn Lick  . Diabetes mellitus without complication (HCC)   . Diverticulosis   . Elevated alkaline phosphatase level   . Fatty liver   . Gallstones   . Hiatal hernia   . Hyperlipidemia   . Hypertension   . Internal hemorrhoids   . Sleep apnea    2008- sleep study, neg. for sleep apnea   . Stroke (HCC)   . SVT (supraventricular tachycardia)   . Symptomatic cholelithiasis 09/11/2018   Ht 5' 3.5" (1.613 m)   Wt 228 lb (103.4 kg)   LMP 02/09/2017   BMI 39.75 kg/m   Opioid Risk Score:   Fall Risk Score:  `1  Depression screen PHQ 2/9     11/15/2022    3:15 PM 09/17/2022    1:41 PM 09/05/2022    2:16 PM 07/17/2022    3:22 PM 05/11/2022    3:18 PM 03/01/2022    3:10 PM 10/04/2021    3:08 PM  Depression screen PHQ 2/9  Decreased Interest 0 0 0 0 0 0 0  Down, Depressed, Hopeless 0 0 0 0 0 0 0  PHQ - 2 Score 0 0 0 0 0 0 0      Review of Systems   Musculoskeletal:  Positive for back pain and gait problem.  All other systems reviewed and are negative.     Objective:   Physical Exam Vitals and nursing note reviewed.  Constitutional:      Appearance: Normal appearance. She is obese.  Cardiovascular:     Rate and Rhythm: Normal rate and regular rhythm.  Pulses: Normal pulses.     Heart sounds: Normal heart sounds.  Pulmonary:     Effort: Pulmonary effort is normal.     Breath sounds: Normal breath sounds.  Musculoskeletal:     Cervical back: Normal range of motion and neck supple.     Right lower leg: Edema present.     Left lower leg: Edema present.     Comments: Normal Muscle Bulk and Muscle Testing Reveals:  Upper Extremities: Full ROM and Muscle Strength 5/5  Lumbar Paraspinal Tenderness: L-4-L-5 Bilateral Greater Trochanter Tenderness Lower Extremities: Full ROM and Muscle Strength 5/5 Arises from Chair with ease Narrow Based Gait     Skin:    General: Skin is warm and dry.  Neurological:     Mental Status: She is alert and oriented to person, place, and time.  Psychiatric:        Mood and Affect: Mood normal.        Behavior: Behavior normal.         Assessment & Plan:  1.Chronic Bilateral Hip pain L>R---endstage OA of hips..Continue HEP as tolerated and Heat and Ice Therapy. 11/15/2022 Refilled: Oxycodone 7.5/325 one tablet 4 times a day as needed for moderate pain #120. Second script sent for the following month.11/15/2022 We will continue the opioid monitoring program, this consists of regular clinic visits, examinations, urine drug screen, pill counts as well as use of [place] Controlled Substance Reporting system. A 12 month History has been reviewed on the [place] Controlled Substance Reporting System on 11/15/2022 2. Lumbar Radiculitis:  Continue medication regimen with Pamelor. Continue Cymbalta and Continue  HEP as Tolerated.  Continue to Monitor. 11/15/2022.          -              3.  Morbid obesity: Continue with healthy diet Regime and HEP. Encouraged to continue with healthy diet regime and HEP. 11/15/2022 4.Bilateral  Greater Trochanteric Bursitis:.Continue with Heat and Ice Therapy. Continue to Monitor. 11/15/2022. 5. Left Knee Pain: No complaints Today.Continue with HEP as Tolerated. Continue to Monitor. 11/15/2022 6. Muscle Spasm: Continue  Methocarbamol. Continue to monitor. 11/15/2022.    F/U in 2 months

## 2022-11-21 LAB — TOXASSURE SELECT,+ANTIDEPR,UR

## 2022-11-26 ENCOUNTER — Other Ambulatory Visit: Payer: Self-pay | Admitting: Internal Medicine

## 2022-11-27 MED ORDER — TOUJEO MAX SOLOSTAR 300 UNIT/ML ~~LOC~~ SOPN: PEN_INJECTOR | SUBCUTANEOUS | 0 refills | Status: DC

## 2022-12-05 ENCOUNTER — Other Ambulatory Visit: Payer: Self-pay | Admitting: Medical

## 2022-12-05 ENCOUNTER — Telehealth: Payer: Self-pay

## 2022-12-05 MED ORDER — OMEPRAZOLE 20 MG PO CPDR: 20.0000 mg | DELAYED_RELEASE_CAPSULE | Freq: Every day | ORAL | 0 refills | Status: DC

## 2022-12-05 MED ORDER — OMEPRAZOLE MAGNESIUM 20 MG PO TBEC
20.0000 mg | DELAYED_RELEASE_TABLET | Freq: Every day | ORAL | 0 refills | Status: DC
Start: 2022-12-05 — End: 2022-12-05

## 2022-12-05 NOTE — Telephone Encounter (Signed)
Faxed request for an rx change for omeprazole 20 mg. States insurance pays for capsules.

## 2022-12-21 ENCOUNTER — Other Ambulatory Visit: Payer: Self-pay | Admitting: Registered Nurse

## 2023-01-02 ENCOUNTER — Ambulatory Visit: Payer: BC Managed Care – PPO | Admitting: Internal Medicine

## 2023-01-08 ENCOUNTER — Other Ambulatory Visit: Payer: Self-pay

## 2023-01-08 DIAGNOSIS — R933 Abnormal findings on diagnostic imaging of other parts of digestive tract: Secondary | ICD-10-CM

## 2023-01-09 ENCOUNTER — Ambulatory Visit: Payer: BC Managed Care – PPO | Admitting: Medical

## 2023-01-16 ENCOUNTER — Encounter: Payer: Self-pay | Admitting: Registered Nurse

## 2023-01-16 ENCOUNTER — Encounter: Payer: BC Managed Care – PPO | Attending: Registered Nurse | Admitting: Registered Nurse

## 2023-01-16 VITALS — BP 113/68 | HR 78 | Ht 63.5 in | Wt 231.0 lb

## 2023-01-16 DIAGNOSIS — G8929 Other chronic pain: Secondary | ICD-10-CM | POA: Diagnosis present

## 2023-01-16 DIAGNOSIS — M7061 Trochanteric bursitis, right hip: Secondary | ICD-10-CM | POA: Diagnosis not present

## 2023-01-16 DIAGNOSIS — M5416 Radiculopathy, lumbar region: Secondary | ICD-10-CM | POA: Diagnosis not present

## 2023-01-16 DIAGNOSIS — M25552 Pain in left hip: Secondary | ICD-10-CM | POA: Insufficient documentation

## 2023-01-16 DIAGNOSIS — Z79891 Long term (current) use of opiate analgesic: Secondary | ICD-10-CM | POA: Diagnosis present

## 2023-01-16 DIAGNOSIS — Z5181 Encounter for therapeutic drug level monitoring: Secondary | ICD-10-CM

## 2023-01-16 DIAGNOSIS — G894 Chronic pain syndrome: Secondary | ICD-10-CM

## 2023-01-16 DIAGNOSIS — M545 Low back pain, unspecified: Secondary | ICD-10-CM | POA: Insufficient documentation

## 2023-01-16 DIAGNOSIS — M7062 Trochanteric bursitis, left hip: Secondary | ICD-10-CM | POA: Insufficient documentation

## 2023-01-16 MED ORDER — OXYCODONE-ACETAMINOPHEN 7.5-325 MG PO TABS: 1.0000 | ORAL_TABLET | Freq: Four times a day (QID) | ORAL | 0 refills | Status: DC | PRN

## 2023-01-16 NOTE — Progress Notes (Signed)
Subjective:    Patient ID: [name], female    DOB: 14-Aug-1960, 62 y.o.   MRN: [id number]  HPI: [name] is a 62 y.o. female who returns for follow up appointment for chronic pain and medication refill. She states her  pain is located in her lower back radiating into her bilateral hips L>R and Left lower extremity. She also reports increase intensity of lower back and left hip pain, we will order X-rays, she verbalizes understanding. She rates her pain 9. current exercise regime is walking and performing stretching exercises.   Mr. Rossi Morphine equivalent is 45.00 MME.   Last UDS was Performed on 11/15/2022, it was consistent.    Pain Inventory Average Pain 8 Pain Right Now 9 My pain is sharp, dull, stabbing, and aching  In the last 24 hours, has pain interfered with the following? General activity 9 Relation with others 9 Enjoyment of life 9 What TIME of day is your pain at its worst? daytime and evening Sleep (in general) Poor  Pain is worse with: walking and sitting Pain improves with: medication Relief from Meds: 5  Family History  Problem Relation Age of Onset   Hypertension Mother    Alzheimer's disease Mother    Diabetes Father    Breast cancer Sister 64   Anesthesia problems Neg Hx    Hypotension Neg Hx    Malignant hyperthermia Neg Hx    Pseudochol deficiency Neg Hx    Colon cancer Neg Hx    Esophageal cancer Neg Hx    Stomach cancer Neg Hx    Rectal cancer Neg Hx    Thyroid disease Neg Hx    Social History   Socioeconomic History   Marital status: Divorced    Spouse name: Not on file   Number of children: 0   Years of education: Not on file   Highest education level: Not on file  Occupational History   Occupation: TEACHER ASSISTANT  Tobacco Use   Smoking status: Never   Smokeless tobacco: Never  Vaping Use   Vaping status: Never Used  Substance and Sexual Activity   Alcohol use: No   Drug use: No   Sexual activity: Yes   Other Topics Concern   Not on file  Social History Narrative   Not on file   Social Determinants of Health   Financial Resource Strain: Not on file  Food Insecurity: Food Insecurity Present (09/03/2019)   Hunger Vital Sign    Worried About Running Out of Food in the Last Year: Sometimes true    Ran Out of Food in the Last Year: Sometimes true  Transportation Needs: No Transportation Needs (09/03/2019)   PRAPARE - Administrator, Civil Service (Medical): No    Lack of Transportation (Non-Medical): No  Physical Activity: Not on file  Stress: Not on file  Social Connections: Not on file   Past Surgical History:  Procedure Laterality Date   ANAL EXAMINATION UNDER ANESTHESIA  02/21/2011   anal fistula   BREAST EXCISIONAL BIOPSY Left    BREAST SURGERY  patient does not remember date of procedure   pull fluid off lft br   CHOLECYSTECTOMY N/A 09/12/2018   Procedure: LAPAROSCOPIC CHOLECYSTECTOMY;  Surgeon: Almond Lint, MD;  Location: [facility];  Service: General;  Laterality: N/A;   COLONOSCOPY  10/08/2016   2 day prep 1 TA,   ELECTROPHYSIOLOGIC STUDY N/A 05/05/2015   Procedure: SVT Ablation;  Surgeon: Will Jorja Loa, MD;  Location: [facility]  INVASIVE CV LAB;  Service: Cardiovascular;  Laterality: N/A;   EP IMPLANTABLE DEVICE N/A 01/30/2016   Procedure: Loop Recorder Insertion;  Surgeon: Thurmon Fair, MD;  Location: [facility];  Service: Cardiovascular;  Laterality: N/A;   INCISE AND DRAIN ABCESS     abscess on right thigh and buttock   KNEE ARTHROSCOPY     left   LAPAROSCOPIC APPENDECTOMY N/A 09/19/2018   Procedure: APPENDECTOMY LAPAROSCOPIC;  Surgeon: Berna Bue, MD;  Location: [facility];  Service: General;  Laterality: N/A;   SHOULDER SURGERY  04/14/2009   right   Past Surgical History:  Procedure Laterality Date   ANAL EXAMINATION UNDER ANESTHESIA  02/21/2011   anal fistula   BREAST EXCISIONAL BIOPSY Left    BREAST SURGERY  patient does not remember  date of procedure   pull fluid off lft br   CHOLECYSTECTOMY N/A 09/12/2018   Procedure: LAPAROSCOPIC CHOLECYSTECTOMY;  Surgeon: Almond Lint, MD;  Location: [facility];  Service: General;  Laterality: N/A;   COLONOSCOPY  10/08/2016   2 day prep 1 TA,   ELECTROPHYSIOLOGIC STUDY N/A 05/05/2015   Procedure: SVT Ablation;  Surgeon: Will Jorja Loa, MD;  [facility]: [facility];  Service: Cardiovascular;  Laterality: N/A;   EP IMPLANTABLE DEVICE N/A 01/30/2016   Procedure: Loop Recorder Insertion;  Surgeon: Thurmon Fair, MD;  Location: [facility];  Service: Cardiovascular;  Laterality: N/A;   INCISE AND DRAIN ABCESS     abscess on right thigh and buttock   KNEE ARTHROSCOPY     left   LAPAROSCOPIC APPENDECTOMY N/A 09/19/2018   Procedure: APPENDECTOMY LAPAROSCOPIC;  Surgeon: Berna Bue, MD;  Location: [facility];  Service: General;  Laterality: N/A;   SHOULDER SURGERY  04/14/2009   right   Past Medical History:  Diagnosis Date   Abscess    increased drainage from abscess on buttock   Anal fistula    Anxiety    Bilateral hip pain 05/27/2015   Chronic pain syndrome 05/27/2015   Depression    sees Dr. Sherlyn Lick   Diabetes mellitus without complication [facility])    Diverticulosis    Elevated alkaline phosphatase level    Fatty liver    Gallstones    Hiatal hernia    Hyperlipidemia    Hypertension    Internal hemorrhoids    Sleep apnea    2008- sleep study, neg. for sleep apnea    Stroke (HCC)    SVT (supraventricular tachycardia) (HCC)    Symptomatic cholelithiasis 09/11/2018   Ht 5' 3.5" (1.613 m)   Wt 231 lb (104.8 kg)   LMP 02/09/2017   BMI 40.28 kg/m   Opioid Risk Score:   Fall Risk Score:  `1  Depression screen PHQ 2/9     01/16/2023    2:50 PM 11/15/2022    3:15 PM 09/17/2022    1:41 PM 09/05/2022    2:16 PM 07/17/2022    3:22 PM 05/11/2022    3:18 PM 03/01/2022    3:10 PM  Depression screen PHQ 2/9  Decreased Interest 0 0 0 0 0 0 0  Down, Depressed, Hopeless  0 0 0 0 0 0 0  PHQ - 2 Score 0 0 0 0 0 0 0      Review of Systems  Musculoskeletal:  Positive for back pain and gait problem.  All other systems reviewed and are negative.     Objective:   Physical Exam Vitals and nursing note reviewed.  Constitutional:  Appearance: Normal appearance. She is obese.  Cardiovascular:     Rate and Rhythm: Normal rate and regular rhythm.     Pulses: Normal pulses.     Heart sounds: Normal heart sounds.  Pulmonary:     Effort: Pulmonary effort is normal.     Breath sounds: Normal breath sounds.  Musculoskeletal:     Comments: Normal Muscle Bulk and Muscle Testing Reveals:  Upper Extremities: Full ROM and Muscle Strength 5/5  Lumbar Hypersensitivity Left Greater Trochanter tenderness Lower Extremities: Full ROM and Muscle Strength 5/5 Arises from Chair slowly Antalgic  Gait     Skin:    General: Skin is warm and dry.  Neurological:     Mental Status: She is alert and oriented to person, place, and time.  Psychiatric:        Mood and Affect: Mood normal.        Behavior: Behavior normal.         Assessment & Plan:  1.Acute Exacerbation of Chronic Low Back pain: RX: Lumbar X-ray. Continue to Monitor/  2. Chronic Left Hip Pain: RX: DG Hip Xray. Contiue to Monitor.  3.Chronic Bilateral Hip pain L>R---endstage OA of hips..Continue HEP as tolerated and Heat and Ice Therapy. 01/16/2023 Refilled: Oxycodone 7.5/325 one tablet 4 times a day as needed for moderate pain #120. Second script sent for the following month.11/15/2022 We will continue the opioid monitoring program, this consists of regular clinic visits, examinations, urine drug screen, pill counts as well as use of [place] Controlled Substance Reporting system. A 12 month History has been reviewed on the [place] Controlled Substance Reporting System on 01/16/2023 4. Lumbar Radiculitis:  Continue medication regimen with Pamelor. Continue Cymbalta and Continue  HEP as  Tolerated.  Continue to Monitor. 01/16/2023.          -              5. Morbid obesity: Continue with healthy diet Regime and HEP. Encouraged to continue with healthy diet regime and HEP. 01/16/2023 6.Bilateral  Greater Trochanteric Bursitis:.L>R: Continue with Heat and Ice Therapy. Continue to Monitor. 01/16/2023. 7. Left Knee Pain: No complaints Today.Continue with HEP as Tolerated. Continue to Monitor. 01/16/2023 8. Muscle Spasm: Continue  Methocarbamol. Continue to monitor. 01/16/2023.    F/U in 2 months

## 2023-01-17 ENCOUNTER — Encounter: Payer: BC Managed Care – PPO | Attending: Registered Nurse | Admitting: Registered Nurse

## 2023-02-03 ENCOUNTER — Other Ambulatory Visit: Payer: Self-pay | Admitting: Internal Medicine

## 2023-02-04 MED ORDER — TOUJEO MAX SOLOSTAR 300 UNIT/ML ~~LOC~~ SOPN: PEN_INJECTOR | SUBCUTANEOUS | 0 refills | Status: DC

## 2023-02-19 ENCOUNTER — Other Ambulatory Visit: Payer: Self-pay | Admitting: Internal Medicine

## 2023-02-19 MED ORDER — OMEPRAZOLE 20 MG PO CPDR: 20.0000 mg | DELAYED_RELEASE_CAPSULE | Freq: Every day | ORAL | 1 refills | Status: DC

## 2023-03-19 ENCOUNTER — Inpatient Hospital Stay: Admission: RE | Admit: 2023-03-19 | Payer: 59 | Source: Ambulatory Visit

## 2023-03-21 ENCOUNTER — Encounter: Payer: Self-pay | Admitting: Registered Nurse

## 2023-03-21 ENCOUNTER — Encounter: Payer: 59 | Attending: Registered Nurse | Admitting: Registered Nurse

## 2023-03-21 VITALS — BP 111/63 | HR 79 | Ht 63.5 in | Wt 231.2 lb

## 2023-03-21 DIAGNOSIS — M7061 Trochanteric bursitis, right hip: Secondary | ICD-10-CM | POA: Insufficient documentation

## 2023-03-21 DIAGNOSIS — M7062 Trochanteric bursitis, left hip: Secondary | ICD-10-CM | POA: Diagnosis present

## 2023-03-21 DIAGNOSIS — Z79891 Long term (current) use of opiate analgesic: Secondary | ICD-10-CM | POA: Diagnosis present

## 2023-03-21 DIAGNOSIS — G894 Chronic pain syndrome: Secondary | ICD-10-CM | POA: Insufficient documentation

## 2023-03-21 DIAGNOSIS — Z5181 Encounter for therapeutic drug level monitoring: Secondary | ICD-10-CM | POA: Insufficient documentation

## 2023-03-21 DIAGNOSIS — M5416 Radiculopathy, lumbar region: Secondary | ICD-10-CM | POA: Diagnosis not present

## 2023-03-21 DIAGNOSIS — M25551 Pain in right hip: Secondary | ICD-10-CM | POA: Diagnosis present

## 2023-03-21 DIAGNOSIS — G8929 Other chronic pain: Secondary | ICD-10-CM | POA: Diagnosis present

## 2023-03-21 DIAGNOSIS — M545 Low back pain, unspecified: Secondary | ICD-10-CM | POA: Insufficient documentation

## 2023-03-21 DIAGNOSIS — M25552 Pain in left hip: Secondary | ICD-10-CM | POA: Diagnosis present

## 2023-03-21 MED ORDER — OXYCODONE-ACETAMINOPHEN 7.5-325 MG PO TABS: 1.0000 | ORAL_TABLET | Freq: Four times a day (QID) | ORAL | 0 refills | Status: DC | PRN
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
## 2023-03-23 ENCOUNTER — Other Ambulatory Visit: Payer: Self-pay | Admitting: Internal Medicine

## 2023-03-25 ENCOUNTER — Other Ambulatory Visit: Payer: Self-pay | Admitting: Registered Nurse

## 2023-03-26 ENCOUNTER — Other Ambulatory Visit: Payer: Self-pay | Admitting: *Deleted

## 2023-03-26 MED ORDER — NORTRIPTYLINE HCL 75 MG PO CAPS: 75.0000 mg | ORAL_CAPSULE | Freq: Every day | ORAL | 0 refills | Status: DC

## 2023-03-29 ENCOUNTER — Ambulatory Visit
Admission: RE | Admit: 2023-03-29 | Discharge: 2023-03-29 | Disposition: A | Discharge status: Home / Self Care | Payer: 59 | Source: Ambulatory Visit | Attending: Medical | Admitting: Medical

## 2023-03-29 DIAGNOSIS — Z0000 Encounter for general adult medical examination without abnormal findings: Secondary | ICD-10-CM

## 2023-03-29 DIAGNOSIS — Z780 Asymptomatic menopausal state: Secondary | ICD-10-CM

## 2023-03-29 NOTE — Progress Notes (Signed)
I do not see where she has seen endocrinology recently.  Has she seen them recently?  If not go ahead and get her a follow-up ASAP here with me for med check regarding diabetes and to discuss the bone density results that show low bone mass but not osteoporosis  I also do not see her most recent eye doctor visit notes, so we will need to get a copy of that

## 2023-03-31 ENCOUNTER — Other Ambulatory Visit: Payer: Self-pay | Admitting: Registered Nurse

## 2023-04-03 ENCOUNTER — Encounter: Payer: Self-pay | Admitting: Internal Medicine

## 2023-04-03 ENCOUNTER — Ambulatory Visit: Payer: Self-pay | Admitting: Medical

## 2023-04-03 VITALS — BP 110/70 | HR 88 | Wt 226.0 lb

## 2023-04-03 DIAGNOSIS — R7989 Other specified abnormal findings of blood chemistry: Secondary | ICD-10-CM

## 2023-04-03 DIAGNOSIS — I10 Essential (primary) hypertension: Secondary | ICD-10-CM

## 2023-04-03 DIAGNOSIS — Z8639 Personal history of other endocrine, nutritional and metabolic disease: Secondary | ICD-10-CM | POA: Insufficient documentation

## 2023-04-03 DIAGNOSIS — N1832 Chronic kidney disease, stage 3b: Secondary | ICD-10-CM

## 2023-04-03 DIAGNOSIS — E1169 Type 2 diabetes mellitus with other specified complication: Secondary | ICD-10-CM | POA: Diagnosis not present

## 2023-04-03 DIAGNOSIS — Z8673 Personal history of transient ischemic attack (TIA), and cerebral infarction without residual deficits: Secondary | ICD-10-CM

## 2023-04-03 DIAGNOSIS — M8580 Other specified disorders of bone density and structure, unspecified site: Secondary | ICD-10-CM | POA: Diagnosis not present

## 2023-04-03 DIAGNOSIS — E785 Hyperlipidemia, unspecified: Secondary | ICD-10-CM

## 2023-04-03 DIAGNOSIS — Z794 Long term (current) use of insulin: Secondary | ICD-10-CM

## 2023-04-03 DIAGNOSIS — E1122 Type 2 diabetes mellitus with diabetic chronic kidney disease: Secondary | ICD-10-CM

## 2023-04-03 DIAGNOSIS — E1142 Type 2 diabetes mellitus with diabetic polyneuropathy: Secondary | ICD-10-CM

## 2023-04-03 NOTE — Progress Notes (Signed)
Subjective:  [name] is a 63 y.o. female who presents for Chief Complaint  Patient presents with   Medical Management of Chronic Issues    Med check, and discuss bone density results,      Here for med check and to discuss her recent bone density test  Patient Care Team: Giomar Gusler, Kermit Balo, PA-C as PCP - General (Family Medicine) Croitoru, Rachelle Hora, MD as PCP - Cardiology (Cardiology) Griselda Miner, MD as Consulting Physician (General Surgery) [facility] Shamleffer, Konrad Dolores, MD as Attending Physician (Endocrinology) Pyrtle, Carie Caddy, MD as Consulting Physician (Gastroenterology) Ranelle Oyster, MD as Consulting Physician (Physical Medicine and Rehabilitation) Jones Bales, NP as Nurse Practitioner (Physical Medicine and Rehabilitation) Eye doctor - Dr. Victoriano [facility]   Concerns: Diabetes -  Hasn't been checking sugars.  Very painful with sticking lancets, and can't afford the libre.  She is compliant with her mealtime insulin and long-acting insulin. She was on GLP-1 last year but this caused a lot of gastric problems.  She had several months where she could not eat much and was losing weight.  She ended up having endoscopy and colonoscopy.  She was taken off of those medicines.  HTN -compliant with Toprol and losartan  Hyperlipidemia -compliant with Crestor and fenofibrate and aspirin  Taking vitamin D 2000u daily  Exercise - walking.  Working full time at [employer], Geologist, engineering.   She takes her Lasix and potassium every day  She continues to see pain management regularly  Last kidney doctor visit several months ago.  No other aggravating or relieving factors.    No other c/o.  Past Medical History:  Diagnosis Date   Abscess    increased drainage from abscess on buttock   Anal fistula    Anxiety    Bilateral hip pain 05/27/2015   Chronic pain syndrome 05/27/2015   Depression    sees Dr. Sherlyn Lick    Diabetes mellitus without complication [facility])    Diverticulosis    Elevated alkaline phosphatase level    Fatty liver    Gallstones    Hiatal hernia    Hyperlipidemia    Hypertension    Internal hemorrhoids    Sleep apnea    2008- sleep study, neg. for sleep apnea    Stroke [facility])    SVT (supraventricular tachycardia) (HCC)    Symptomatic cholelithiasis 09/11/2018   Current Outpatient Medications on File Prior to Visit  Medication Sig Dispense Refill   albuterol (VENTOLIN HFA) 108 (90 Base) MCG/ACT inhaler Inhale 2 puffs into the lungs every 6 (six) hours as needed for wheezing or shortness of breath. 8 g 1   aspirin EC 81 MG tablet Take 1 tablet (81 mg total) by mouth daily. 90 tablet 3   budesonide-formoterol (SYMBICORT) 160-4.5 MCG/ACT inhaler Inhale 2 puffs into the lungs 2 (two) times daily. 1 each 5   calcium carbonate (CALCIUM 600) 600 MG TABS tablet Take 1 tablet (600 mg total) by mouth 2 (two) times daily with a meal. 180 tablet 3   Continuous Glucose Sensor (FREESTYLE LIBRE 3 SENSOR) MISC 1 Device by Does not apply route every 14 (fourteen) days. 6 each 3   DULoxetine (CYMBALTA) 60 MG capsule Take 1 capsule (60 mg total) by mouth 2 (two) times daily. 180 capsule 3   fenofibrate (TRICOR) 145 MG tablet Take 1 tablet (145 mg total) by mouth daily. 90 tablet 3   ferrous gluconate (FERGON) 324 MG tablet Take  1 tablet (324 mg total) by mouth daily with breakfast. 90 tablet 3   FIASP FLEXTOUCH 100 UNIT/ML FlexTouch Pen INJECT 36 UNITS INTO THE SKIN WITH BREAKFAST, WITH LUNCH, AND WITH EVENING MEAL 15 mL 0   furosemide (LASIX) 80 MG tablet Take 1 tablet (80 mg total) by mouth daily. 90 tablet 3   insulin glargine, 2 Unit Dial, ([name] MAX SOLOSTAR) 300 UNIT/ML Solostar Pen INJECT 86 UNITS INTO THE SKIN DAILY AT 6AM 30 mL 0   Insulin Pen Needle (PEN NEEDLES) 32G X 6 MM MISC Use as instructed to inject insulin in the morning, at noon, in the evening, and at bedtime 400 each 3   losartan  (COZAAR) 25 MG tablet Take 1 tablet (25 mg total) by mouth daily. 90 tablet 3   metoprolol succinate (TOPROL-XL) 25 MG 24 hr tablet Take 1 tablet (25 mg total) by mouth daily. 90 tablet 3   nortriptyline (PAMELOR) 75 MG capsule Take 1 capsule (75 mg total) by mouth at bedtime. 90 capsule 0   omeprazole (PRILOSEC) 20 MG capsule Take 1 capsule (20 mg total) by mouth daily. 90 capsule 1   oxyCODONE-acetaminophen (PERCOCET) 7.5-325 MG tablet Take 1 tablet by mouth every 6 (six) hours as needed. Please remove previous prescription, wrong year listed. 120 tablet 0   potassium chloride (KLOR-CON) 10 MEQ tablet Take 1 tablet (10 mEq total) by mouth 2 (two) times daily. 180 tablet 3   rosuvastatin (CRESTOR) 20 MG tablet Take 1 tablet (20 mg total) by mouth daily. 90 tablet 3   sucralfate (CARAFATE) 1 g tablet Take 1 tablet (1 g total) by mouth 4 (four) times daily. 30 tablet 0   Continuous Glucose Sensor (FREESTYLE LIBRE 3 SENSOR) MISC 2 each by Does not apply route every 14 (fourteen) days. Place 1 sensor on the skin every 14 days. Use to check glucose continuously 2 each 11   naloxone (NARCAN) nasal spray 4 mg/0.1 mL  (Patient not taking: Reported on 04/03/2023)     No current facility-administered medications on file prior to visit.     The following portions of the patient's history were reviewed and updated as appropriate: allergies, current medications, past family history, past medical history, past social history, past surgical history and problem list.  ROS Otherwise as in subjective above    Objective: BP 110/70   Pulse 88   Wt 226 lb (102.5 kg)   LMP 02/09/2017   BMI 39.41 kg/m   Wt Readings from Last 3 Encounters:  04/03/23 226 lb (102.5 kg)  03/21/23 231 lb 3.2 oz (104.9 kg)  01/16/23 231 lb (104.8 kg)   BP Readings from Last 3 Encounters:  04/03/23 110/70  03/21/23 111/63  01/16/23 113/68    General appearance: alert, no distress, well developed, well nourished Heart:  RRR, normal S1, S2, no murmurs Lungs: CTA bilaterally, no wheezes, rhonchi, or rales Pulses: 2+ radial pulses, 2+ pedal pulses, normal cap refill Ext: no edema  Diabetic Foot Exam - Simple   Simple Foot Form Visual Inspection See comments: Yes Sensation Testing Intact to touch and monofilament testing bilaterally: Yes Pulse Check See comments: Yes Comments 1+ pedal pulses.  Flat feet.       Assessment: Encounter Diagnoses  Name Primary?   Osteopenia, unspecified location Yes   Hyperlipidemia associated with type 2 diabetes mellitus (HCC)    History of stroke    Essential hypertension, benign    Type 2 diabetes mellitus with stage 3b chronic kidney disease,  with long-term current use of insulin (HCC)    Type 2 diabetes mellitus with diabetic polyneuropathy, with long-term current use of insulin (HCC)    Abnormal thyroid blood test    History of iron deficiency    CKD stage 3b, GFR 30-44 ml/min (HCC)      Plan: Osteopenia-we discussed her recent bone density results.  Counseled on exercise including weightbearing and aerobic exercise.  Continue with vitamin D supplement and calcium intake.  Plan to repeat bone density test in 4 to 5 years  Hyperlipidemia- Continue rosuvastatin Crestor 20 mg daily, fenofibrate 145 mg daily, aspirin 81 mg daily  Hypertension- Continue Toprol-XL 25 mg daily, losartan 25 mg daily  History of heart failure-continue on Lasix 80 mg daily and potassium daily  Diabetes-we discussed other medicine she has taken before and did not do well on.  She had some major complications with GLP-1's this past year 2024.  She is also had problems with Jardiance in the past with yeast infections.  I recommended she go back and see endocrinology soon as she is past due for appointment.  Continue [name] 86 units daily, continue Fiasp fast acting insulin 36 units, 3 times a day.  We will work with vendor to help get Dexcom as she has really hard time using glucometer  and freestyle Josephine Igo was too expensive.  Abnormal thyroid test in the past-updated labs today  CKD 3-I had referred her to nephrology last year but despite multiple attempts to contact patient she did not return the call so she actually has not established with nephrology despite our recommendation  We will have her sign today to get a copy of her last gynecology notes and last Pap smear   Lodema was seen today for medical management of chronic issues.  Diagnoses and all orders for this visit:  Osteopenia, unspecified location  Hyperlipidemia associated with type 2 diabetes mellitus (HCC) -     Hepatic function panel  History of stroke  Essential hypertension, benign -     Renal Function Panel -     Hepatic function panel  Type 2 diabetes mellitus with stage 3b chronic kidney disease, with long-term current use of insulin (HCC) -     Hemoglobin A1c -     Renal Function Panel  Type 2 diabetes mellitus with diabetic polyneuropathy, with long-term current use of insulin (HCC)  Abnormal thyroid blood test -     TSH + free T4  History of iron deficiency -     CBC -     Iron, TIBC and Ferritin Panel  CKD stage 3b, GFR 30-44 ml/min (HCC)    Follow up: pending labs

## 2023-04-04 ENCOUNTER — Ambulatory Visit
Admission: RE | Admit: 2023-04-04 | Discharge: 2023-04-04 | Disposition: A | Discharge status: Home / Self Care | Payer: 59 | Source: Ambulatory Visit | Attending: Registered Nurse | Admitting: Registered Nurse

## 2023-04-04 ENCOUNTER — Other Ambulatory Visit: Payer: Self-pay | Admitting: Internal Medicine

## 2023-04-04 ENCOUNTER — Encounter: Payer: Self-pay | Admitting: Internal Medicine

## 2023-04-04 DIAGNOSIS — E1169 Type 2 diabetes mellitus with other specified complication: Secondary | ICD-10-CM

## 2023-04-04 DIAGNOSIS — E1122 Type 2 diabetes mellitus with diabetic chronic kidney disease: Secondary | ICD-10-CM

## 2023-04-04 DIAGNOSIS — Z794 Long term (current) use of insulin: Secondary | ICD-10-CM

## 2023-04-04 LAB — IRON,TIBC AND FERRITIN PANEL
Ferritin: 74 ng/mL (ref 15–150)
Iron Saturation: 19 % (ref 15–55)
Iron: 68 ug/dL (ref 27–139)
Total Iron Binding Capacity: 357 ug/dL (ref 250–450)
UIBC: 289 ug/dL (ref 118–369)

## 2023-04-04 LAB — TSH+FREE T4
Free T4: 0.95 ng/dL (ref 0.82–1.77)
TSH: 0.321 u[IU]/mL — ABNORMAL LOW (ref 0.450–4.500)

## 2023-04-04 LAB — HEPATIC FUNCTION PANEL
ALT: 19 IU/L (ref 0–32)
AST: 20 IU/L (ref 0–40)
Alkaline Phosphatase: 70 IU/L (ref 44–121)
Bilirubin Total: 0.3 mg/dL (ref 0.0–1.2)
Bilirubin, Direct: 0.12 mg/dL (ref 0.00–0.40)
Total Protein: 5.8 g/dL — ABNORMAL LOW (ref 6.0–8.5)

## 2023-04-04 LAB — RENAL FUNCTION PANEL
Albumin: 3.7 g/dL — ABNORMAL LOW (ref 3.9–4.9)
BUN/Creatinine Ratio: 14 (ref 12–28)
BUN: 22 mg/dL (ref 8–27)
CO2: 22 mmol/L (ref 20–29)
Calcium: 10 mg/dL (ref 8.7–10.3)
Chloride: 100 mmol/L (ref 96–106)
Creatinine, Ser: 1.59 mg/dL — ABNORMAL HIGH (ref 0.57–1.00)
Glucose: 350 mg/dL — ABNORMAL HIGH (ref 70–99)
Phosphorus: 4.1 mg/dL (ref 3.0–4.3)
Potassium: 4.2 mmol/L (ref 3.5–5.2)
Sodium: 136 mmol/L (ref 134–144)
eGFR: 37 mL/min/{1.73_m2} — ABNORMAL LOW (ref >59)

## 2023-04-04 LAB — CBC
Hematocrit: 37.3 % (ref 34.0–46.6)
Hemoglobin: 11.5 g/dL (ref 11.1–15.9)
MCH: 24.8 pg — ABNORMAL LOW (ref 26.6–33.0)
MCHC: 30.8 g/dL — ABNORMAL LOW (ref 31.5–35.7)
MCV: 81 fL (ref 79–97)
Platelets: 386 10*3/uL (ref 150–450)
RBC: 4.63 x10E6/uL (ref 3.77–5.28)
RDW: 14.4 % (ref 11.7–15.4)
WBC: 9.8 10*3/uL (ref 3.4–10.8)

## 2023-04-04 LAB — HEMOGLOBIN A1C
Est. average glucose Bld gHb Est-mCnc: 349 mg/dL
Hgb A1c MFr Bld: 13.8 % — ABNORMAL HIGH (ref 4.8–5.6)

## 2023-04-04 NOTE — Progress Notes (Signed)
Dr. Lonzo Cloud, can you try and get this patient back in ASAP as her sugars are out of control.      Sabrina-refer to nutrition/dietitian Schedule 71-month follow-up with me  Blood counts okay, thyroid labs are still little abnormal, liver test okay, iron is okay, kidney marker shows chronic kidney disease stage IIIb and unfortunately hemoglobin A1c was over 13%  I do recommend getting back in with endocrinology ASAP  I would increase your [name] from 86 to 92 units daily Continue Fiasp mealtime insulin  Continue rest of medicines as usual  I am going to refer you to a nutrition/dietitian to get better help on improving your sugars.  Write down everything you eat for a 5-day.  And take them with you to the dietitian

## 2023-04-09 ENCOUNTER — Telehealth: Payer: Self-pay | Admitting: Registered Nurse

## 2023-04-09 NOTE — Telephone Encounter (Signed)
Hip X-rays reviewed : She will be scheduled for Hip Injection with Dr Riley Kill,  [name] would like  Dr Riley Kill to give her Cortisone injection. Last Patient of the day, due to her work scheduled: Like 3:20

## 2023-04-16 ENCOUNTER — Telehealth: Payer: Self-pay

## 2023-04-16 ENCOUNTER — Other Ambulatory Visit: Payer: Self-pay | Admitting: Internal Medicine

## 2023-04-16 ENCOUNTER — Other Ambulatory Visit: Payer: Self-pay | Admitting: Medical

## 2023-04-16 MED ORDER — ONDANSETRON 4 MG PO TBDP: 4.0000 mg | ORAL_TABLET | Freq: Three times a day (TID) | ORAL | 0 refills | Status: DC | PRN

## 2023-04-16 NOTE — Telephone Encounter (Signed)
Faxed request for ondansetron 4 mg tab

## 2023-04-17 ENCOUNTER — Telehealth: Payer: Self-pay | Admitting: Internal Medicine

## 2023-04-17 ENCOUNTER — Other Ambulatory Visit: Payer: Self-pay | Admitting: Medical

## 2023-04-17 ENCOUNTER — Encounter: Payer: Self-pay | Admitting: Internal Medicine

## 2023-04-17 DIAGNOSIS — Z794 Long term (current) use of insulin: Secondary | ICD-10-CM

## 2023-04-17 DIAGNOSIS — E162 Hypoglycemia, unspecified: Secondary | ICD-10-CM

## 2023-04-17 DIAGNOSIS — Z8673 Personal history of transient ischemic attack (TIA), and cerebral infarction without residual deficits: Secondary | ICD-10-CM

## 2023-04-17 MED ORDER — GVOKE HYPOPEN 2-PACK 0.5 MG/0.1ML ~~LOC~~ SOAJ: 1.0000 | Freq: Every day | SUBCUTANEOUS | 2 refills | Status: AC | PRN

## 2023-04-17 MED ORDER — FREESTYLE LIBRE 3 PLUS SENSOR MISC: 6 refills | Status: DC

## 2023-04-17 NOTE — Telephone Encounter (Signed)
 Left message

## 2023-04-17 NOTE — Telephone Encounter (Signed)
 Patient called to state that she has her Blood sugar drop to 51 on February 1st, 2025. She contacted her brother to come help her. Her brother gave her coke to help bring her BS up. She has had a stroke and pricking of the finger is painful. She has been paying $68 dollars every 2 weeks as she only gets 1 libre sensor. I will send in another refill to see if we can get her libres covered

## 2023-04-19 ENCOUNTER — Other Ambulatory Visit: Payer: Self-pay

## 2023-04-19 MED ORDER — DICYCLOMINE HCL 10 MG PO CAPS: 20.0000 mg | ORAL_CAPSULE | Freq: Three times a day (TID) | ORAL | 2 refills | Status: DC | PRN

## 2023-04-19 MED ORDER — PANTOPRAZOLE SODIUM 40 MG PO TBEC: 40.0000 mg | DELAYED_RELEASE_TABLET | Freq: Every day | ORAL | 1 refills | Status: DC

## 2023-04-19 NOTE — Telephone Encounter (Signed)
 Pt was notified of results

## 2023-04-23 ENCOUNTER — Other Ambulatory Visit: Payer: Self-pay | Admitting: Medical

## 2023-04-23 MED ORDER — ONDANSETRON 4 MG PO TBDP: 4.0000 mg | ORAL_TABLET | Freq: Three times a day (TID) | ORAL | 0 refills | Status: DC | PRN

## 2023-05-08 ENCOUNTER — Encounter: Payer: 59 | Attending: Registered Nurse | Admitting: Physical Medicine & Rehabilitation

## 2023-05-08 ENCOUNTER — Encounter: Payer: Self-pay | Admitting: Physical Medicine & Rehabilitation

## 2023-05-08 VITALS — BP 125/73 | HR 81 | Ht 63.5 in | Wt 235.0 lb

## 2023-05-08 DIAGNOSIS — M7061 Trochanteric bursitis, right hip: Secondary | ICD-10-CM

## 2023-05-08 DIAGNOSIS — M7062 Trochanteric bursitis, left hip: Secondary | ICD-10-CM | POA: Diagnosis not present

## 2023-05-08 MED ORDER — BETAMETHASONE SOD PHOS & ACET 6 (3-3) MG/ML IJ SUSP
12.0000 mg | Freq: Once | INTRAMUSCULAR | Status: AC
  Administered 2023-05-08: 12 mg via INTRA_ARTICULAR

## 2023-05-08 MED ORDER — LIDOCAINE HCL 1 % IJ SOLN
8.0000 mL | Freq: Once | INTRAMUSCULAR | Status: AC
  Administered 2023-05-08: 8 mL

## 2023-05-08 NOTE — Patient Instructions (Signed)
 ALWAYS FEEL FREE TO CALL OUR OFFICE WITH ANY PROBLEMS OR QUESTIONS [phone])  **PLEASE NOTE** ALL MEDICATION REFILL REQUESTS (INCLUDING CONTROLLED SUBSTANCES) NEED TO BE MADE AT LEAST 7 DAYS PRIOR TO REFILL BEING DUE. ANY REFILL REQUESTS INSIDE THAT TIME FRAME MAY RESULT IN DELAYS IN RECEIVING YOUR PRESCRIPTION.

## 2023-05-08 NOTE — Progress Notes (Signed)
 PROCEDURE NOTE  DIAGNOSIS: Greater trochanteric bursitis of both hips - Plan: lidocaine (XYLOCAINE) 1 % (with pres) injection 8 mL, betamethasone acetate-betamethasone sodium phosphate (CELESTONE) injection 12 mg  INTERVENTION:  major joint injections     After informed consent and preparation of the skin with betadine and isopropyl alcohol, I injected 6mg  (1cc) of celestone and 4cc of 1% lidocaine around both the greater trochanters via lateral approach. Additionally, aspiration was performed prior to injection. The patient tolerated well, and no complications were encountered. Afterward the area was cleaned and dressed. Post- injection instructions were provided.      Ranelle Oyster, MD, [facility] Physical Medicine & Rehabilitation 05/08/2023

## 2023-05-16 ENCOUNTER — Encounter: Payer: 59 | Attending: Registered Nurse | Admitting: Registered Nurse

## 2023-05-16 VITALS — BP 121/68 | HR 85 | Ht 63.5 in | Wt 235.0 lb

## 2023-05-16 DIAGNOSIS — M5416 Radiculopathy, lumbar region: Secondary | ICD-10-CM | POA: Insufficient documentation

## 2023-05-16 DIAGNOSIS — G894 Chronic pain syndrome: Secondary | ICD-10-CM | POA: Diagnosis present

## 2023-05-16 DIAGNOSIS — M7062 Trochanteric bursitis, left hip: Secondary | ICD-10-CM | POA: Diagnosis present

## 2023-05-16 DIAGNOSIS — Z79891 Long term (current) use of opiate analgesic: Secondary | ICD-10-CM | POA: Insufficient documentation

## 2023-05-16 DIAGNOSIS — Z5181 Encounter for therapeutic drug level monitoring: Secondary | ICD-10-CM | POA: Diagnosis present

## 2023-05-16 DIAGNOSIS — M47817 Spondylosis without myelopathy or radiculopathy, lumbosacral region: Secondary | ICD-10-CM | POA: Insufficient documentation

## 2023-05-16 DIAGNOSIS — M7061 Trochanteric bursitis, right hip: Secondary | ICD-10-CM | POA: Insufficient documentation

## 2023-05-16 MED ORDER — OXYCODONE-ACETAMINOPHEN 7.5-325 MG PO TABS: 1.0000 | ORAL_TABLET | Freq: Four times a day (QID) | ORAL | 0 refills | Status: DC | PRN

## 2023-05-16 NOTE — Progress Notes (Signed)
 Subjective:    Patient ID: [name], female    DOB: Nov 01, 1960, 63 y.o.   MRN: [id number]  HPI: [name] is a 63 y.o. female who returns for follow up appointment for chronic pain and medication refill. She states her pain is located in her lower back radiating into her left hip. He rates his pain 7. Her current exercise regime is walking and performing stretching exercises.  Ms. Holroyd Morphine equivalent is 45.00 MME.   Oral Swab was Performed today.     Pain Inventory Average Pain 6 Pain Right Now 7 My pain is sharp and aching  In the last 24 hours, has pain interfered with the following? General activity 9 Relation with others 0 Enjoyment of life 8 What TIME of day is your pain at its worst? varies Sleep (in general) Poor  Pain is worse with: walking and some activites Pain improves with: heat/ice and medication Relief from Meds: 7  Family History  Problem Relation Age of Onset   Hypertension Mother    Alzheimer's disease Mother    Diabetes Father    Breast cancer Sister 52   Anesthesia problems Neg Hx    Hypotension Neg Hx    Malignant hyperthermia Neg Hx    Pseudochol deficiency Neg Hx    Colon cancer Neg Hx    Esophageal cancer Neg Hx    Stomach cancer Neg Hx    Rectal cancer Neg Hx    Thyroid disease Neg Hx    Social History   Socioeconomic History   Marital status: Divorced    Spouse name: Not on file   Number of children: 0   Years of education: Not on file   Highest education level: Not on file  Occupational History   Occupation: TEACHER ASSISTANT  Tobacco Use   Smoking status: Never   Smokeless tobacco: Never  Vaping Use   Vaping status: Never Used  Substance and Sexual Activity   Alcohol use: No   Drug use: No   Sexual activity: Yes  Other Topics Concern   Not on file  Social History Narrative   Not on file   Social Drivers of Health   Financial Resource Strain: Not on file  Food Insecurity: Food Insecurity  Present (09/03/2019)   Hunger Vital Sign    Worried About Running Out of Food in the Last Year: Sometimes true    Ran Out of Food in the Last Year: Sometimes true  Transportation Needs: No Transportation Needs (09/03/2019)   PRAPARE - Administrator, Civil Service (Medical): No    Lack of Transportation (Non-Medical): No  Physical Activity: Not on file  Stress: Not on file  Social Connections: Not on file   Past Surgical History:  Procedure Laterality Date   ANAL EXAMINATION UNDER ANESTHESIA  02/21/2011   anal fistula   BREAST EXCISIONAL BIOPSY Left    BREAST SURGERY  patient does not remember date of procedure   pull fluid off lft br   CHOLECYSTECTOMY N/A 09/12/2018   Procedure: LAPAROSCOPIC CHOLECYSTECTOMY;  Surgeon: Almond Lint, MD;  [facility];  Service: General;  Laterality: N/A;   COLONOSCOPY  10/08/2016   2 day prep 1 TA,   ELECTROPHYSIOLOGIC STUDY N/A 05/05/2015   Procedure: SVT Ablation;  Surgeon: Will Jorja Loa, MD;  [facility];  Service: Cardiovascular;  Laterality: N/A;   EP IMPLANTABLE DEVICE N/A 01/30/2016   Procedure: Loop Recorder Insertion;  Surgeon: Thurmon Fair, MD;  [facility];  Service: Cardiovascular;  Laterality: N/A;   INCISE AND DRAIN ABCESS     abscess on right thigh and buttock   KNEE ARTHROSCOPY     left   LAPAROSCOPIC APPENDECTOMY N/A 09/19/2018   Procedure: APPENDECTOMY LAPAROSCOPIC;  Surgeon: Berna Bue, MD;  Location: [facility];  Service: General;  Laterality: N/A;   SHOULDER SURGERY  04/14/2009   right   Past Surgical History:  Procedure Laterality Date   ANAL EXAMINATION UNDER ANESTHESIA  02/21/2011   anal fistula   BREAST EXCISIONAL BIOPSY Left    BREAST SURGERY  patient does not remember date of procedure   pull fluid off lft br   CHOLECYSTECTOMY N/A 09/12/2018   Procedure: LAPAROSCOPIC CHOLECYSTECTOMY;  Surgeon: Almond Lint, MD;  [facility];  Service: General;   Laterality: N/A;   COLONOSCOPY  10/08/2016   2 day prep 1 TA,   ELECTROPHYSIOLOGIC STUDY N/A 05/05/2015   Procedure: SVT Ablation;  Surgeon: Will Jorja Loa, MD;  [facility];  Service: Cardiovascular;  Laterality: N/A;   EP IMPLANTABLE DEVICE N/A 01/30/2016   Procedure: Loop Recorder Insertion;  Surgeon: Thurmon Fair, MD;  [facility];  Service: Cardiovascular;  Laterality: N/A;   INCISE AND DRAIN ABCESS     abscess on right thigh and buttock   KNEE ARTHROSCOPY     left   LAPAROSCOPIC APPENDECTOMY N/A 09/19/2018   Procedure: APPENDECTOMY LAPAROSCOPIC;  Surgeon: Berna Bue, MD;  [facility];  Service: General;  Laterality: N/A;   SHOULDER SURGERY  04/14/2009   right   Past Medical History:  Diagnosis Date   Abscess    increased drainage from abscess on buttock   Anal fistula    Anxiety    Bilateral hip pain 05/27/2015   Chronic pain syndrome 05/27/2015   Depression    sees Dr. Sherlyn Lick   Diabetes mellitus without complication [facility])    Diverticulosis    Elevated alkaline phosphatase level    Fatty liver    Gallstones    Hiatal hernia    Hyperlipidemia    Hypertension    Internal hemorrhoids    Sleep apnea    2008- sleep study, neg. for sleep apnea    Stroke (HCC)    SVT (supraventricular tachycardia) (HCC)    Symptomatic cholelithiasis 09/11/2018   BP 121/68   Pulse 85   Ht 5' 3.5" (1.613 m)   Wt 235 lb (106.6 kg)   LMP 02/09/2017   SpO2 98%   BMI 40.98 kg/m   Opioid Risk Score:   Fall Risk Score:  `1  Depression screen [facility] 2/9     05/08/2023    3:26 PM 03/21/2023    3:07 PM 01/16/2023    2:50 PM 11/15/2022    3:15 PM 09/17/2022    1:41 PM 09/05/2022    2:16 PM 07/17/2022    3:22 PM  Depression screen PHQ 2/9  Decreased Interest 0 0 0 0 0 0 0  Down, Depressed, Hopeless 0 0 0 0 0 0 0  PHQ - 2 Score 0 0 0 0 0 0 0     Review of Systems  Musculoskeletal:  Positive for back pain.       Buttocks and pain going down  leg  All other systems reviewed and are negative.     Objective:   Physical Exam Vitals and nursing note reviewed.  Constitutional:      Appearance: Normal appearance. She  is obese.  Cardiovascular:     Rate and Rhythm: Normal rate and regular rhythm.     Pulses: Normal pulses.     Heart sounds: Normal heart sounds.  Pulmonary:     Effort: Pulmonary effort is normal.     Breath sounds: Normal breath sounds.  Musculoskeletal:     Comments: Normal Muscle Bulk and Muscle Testing Reveals:  Upper Extremities: Full ROM and Muscle Strength 5/5 Lumbar Paraspinal Tenderness: L-4-L-5 Lower Extremities: Full ROM and Muscle Strength 5/5 Arises from Chair with Ease Narrow Based  Gait      Skin:    General: Skin is warm and dry.  Neurological:     Mental Status: She is alert and oriented to person, place, and time.  Psychiatric:        Mood and Affect: Mood normal.        Behavior: Behavior normal.         Assessment & Plan:  1.Chronic Bilateral Hip pain L>R---endstage OA of hips..Continue HEP as tolerated and Heat and Ice Therapy. 05/16/2023 Refilled: Oxycodone 7.5/325 one tablet 4 times a day as needed for moderate pain #120. Second script sent for the following month.05/16/2023 We will continue the opioid monitoring program, this consists of regular clinic visits, examinations, urine drug screen, pill counts as well as use of [place] Controlled Substance Reporting system. A 12 month History has been reviewed on the [place] Controlled Substance Reporting System on 05/16/2023 2. Lumbar Radiculitis:  Continue medication regimen with Pamelor. Continue Cymbalta and Continue  HEP as Tolerated.  Continue to Monitor. 05/16/2023.          -              3. Morbid obesity: Continue with healthy diet Regime and HEP. Encouraged to continue with healthy diet regime and HEP. 05/16/2023 4.Bilateral  Greater Trochanteric Bursitis:.Continue with Heat and Ice Therapy. Continue to Monitor.  05/16/2023.   F/U in 2 months

## 2023-05-18 ENCOUNTER — Other Ambulatory Visit: Payer: Self-pay | Admitting: Internal Medicine

## 2023-05-21 LAB — DRUG TOX MONITOR 1 W/CONF, ORAL FLD
Amphetamines: NEGATIVE ng/mL (ref <10)
Barbiturates: NEGATIVE ng/mL (ref <10)
Benzodiazepines: NEGATIVE ng/mL (ref <0.50)
Buprenorphine: NEGATIVE ng/mL (ref <0.10)
Cocaine: NEGATIVE ng/mL (ref <5.0)
Codeine: NEGATIVE ng/mL (ref <2.5)
Dihydrocodeine: NEGATIVE ng/mL (ref <2.5)
Fentanyl: NEGATIVE ng/mL (ref <0.10)
Heroin Metabolite: NEGATIVE ng/mL (ref <1.0)
Hydrocodone: NEGATIVE ng/mL (ref <2.5)
Hydromorphone: NEGATIVE ng/mL (ref <2.5)
MARIJUANA: NEGATIVE ng/mL (ref <2.5)
MDMA: NEGATIVE ng/mL (ref <10)
Meprobamate: NEGATIVE ng/mL (ref <2.5)
Methadone: NEGATIVE ng/mL (ref <5.0)
Morphine: NEGATIVE ng/mL (ref <2.5)
Nicotine Metabolite: NEGATIVE ng/mL (ref <5.0)
Norhydrocodone: NEGATIVE ng/mL (ref <2.5)
Noroxycodone: 200.5 ng/mL — ABNORMAL HIGH (ref <2.5)
Opiates: POSITIVE ng/mL — AB (ref <2.5)
Oxycodone: >250 ng/mL — ABNORMAL HIGH (ref <2.5)
Oxymorphone: NEGATIVE ng/mL (ref <2.5)
Phencyclidine: NEGATIVE ng/mL (ref <10)
Tapentadol: NEGATIVE ng/mL (ref <5.0)
Tramadol: NEGATIVE ng/mL (ref <5.0)
Zolpidem: NEGATIVE ng/mL (ref <5.0)

## 2023-05-21 LAB — DRUG TOX ALC METAB W/CON, ORAL FLD: Alcohol Metabolite: NEGATIVE ng/mL (ref <25)

## 2023-05-27 ENCOUNTER — Ambulatory Visit: Payer: 59 | Admitting: Gastroenterology

## 2023-05-27 NOTE — Progress Notes (Deleted)
 Marland Kitchen

## 2023-05-28 ENCOUNTER — Ambulatory Visit: Payer: 59 | Admitting: Skilled Nursing Facility1

## 2023-05-29 ENCOUNTER — Telehealth: Payer: Self-pay | Admitting: Registered Nurse

## 2023-05-29 MED ORDER — GABAPENTIN 100 MG PO CAPS: 100.0000 mg | ORAL_CAPSULE | Freq: Three times a day (TID) | ORAL | 1 refills | Status: DC

## 2023-05-29 NOTE — Telephone Encounter (Signed)
 Gabapentin Prescribed: Called [name] with instructions.  Take Gabapentin 100 mg at HS only for the next three nights. If pain persists, Take Gabapentin 100 mg Capsule in the morning and at bedtime for the next 5 days. If pain persists Take Gabapentin 100 mg three times a day.

## 2023-06-04 ENCOUNTER — Telehealth: Payer: Self-pay | Admitting: Internal Medicine

## 2023-06-04 ENCOUNTER — Encounter: Payer: Self-pay | Admitting: Internal Medicine

## 2023-06-04 ENCOUNTER — Ambulatory Visit (INDEPENDENT_AMBULATORY_CARE_PROVIDER_SITE_OTHER): Admitting: Internal Medicine

## 2023-06-04 VITALS — BP 120/70 | HR 85 | Ht 63.5 in | Wt 248.0 lb

## 2023-06-04 DIAGNOSIS — E1165 Type 2 diabetes mellitus with hyperglycemia: Secondary | ICD-10-CM | POA: Diagnosis not present

## 2023-06-04 DIAGNOSIS — E1142 Type 2 diabetes mellitus with diabetic polyneuropathy: Secondary | ICD-10-CM | POA: Diagnosis not present

## 2023-06-04 DIAGNOSIS — N1832 Chronic kidney disease, stage 3b: Secondary | ICD-10-CM

## 2023-06-04 DIAGNOSIS — Z794 Long term (current) use of insulin: Secondary | ICD-10-CM

## 2023-06-04 DIAGNOSIS — E042 Nontoxic multinodular goiter: Secondary | ICD-10-CM

## 2023-06-04 DIAGNOSIS — E1159 Type 2 diabetes mellitus with other circulatory complications: Secondary | ICD-10-CM

## 2023-06-04 DIAGNOSIS — E785 Hyperlipidemia, unspecified: Secondary | ICD-10-CM

## 2023-06-04 DIAGNOSIS — E1122 Type 2 diabetes mellitus with diabetic chronic kidney disease: Secondary | ICD-10-CM

## 2023-06-04 LAB — POCT GLYCOSYLATED HEMOGLOBIN (HGB A1C): Hemoglobin A1C: 10.2 % — AB (ref 4.0–5.6)

## 2023-06-04 MED ORDER — DEXCOM G7 SENSOR MISC: 1.0000 | 3 refills | Status: DC

## 2023-06-04 MED ORDER — FIASP FLEXTOUCH 100 UNIT/ML ~~LOC~~ SOPN: PEN_INJECTOR | SUBCUTANEOUS | 3 refills | Status: DC

## 2023-06-04 MED ORDER — TOUJEO MAX SOLOSTAR 300 UNIT/ML ~~LOC~~ SOPN
84.0000 [IU] | PEN_INJECTOR | Freq: Every day | SUBCUTANEOUS | 3 refills | Status: DC
Start: 2023-06-04 — End: 2023-08-06

## 2023-06-04 MED ORDER — PEN NEEDLES 32G X 6 MM MISC: 3 refills | Status: DC

## 2023-06-04 NOTE — Progress Notes (Signed)
 Name: Ashari Llewellyn  Age/ Sex: 63 y.o., female   MRN/ DOB: [id number], [birth year]     PCP: Jac Canavan, PA-C   Reason for Endocrinology Evaluation: Type 2 Diabetes Mellitus  Initial Endocrine Consultative Visit: 07/06/2020    PATIENT IDENTIFIER: Ms. Yaelis Scharfenberg is a 63 y.o. female with a past medical history of SVT, MNG, T2DM and Dyslipidemia . The patient has followed with Endocrinology clinic since 07/06/2020 for consultative assistance with management of her diabetes.  DIABETIC HISTORY:  Ms. Bowley was diagnosed with DM in 2011 ,Ozempic- severe nausea . Insulin started in 2015. Metformin- no intolerance , jardiance- no intolerance, soliqua. Her hemoglobin A1c has ranged from 6.5%  in 2016, peaking at 13.6% in 2021  THYROID HISTROY : She was found to have hypermetabolic  MNG on PET scan in 2016 during evaluation of ovarian cyst. These results were confirmed with thyroid ultrasound in 2017. She is S/P FNA of the isthmic and right lobe nodule with scant cellularity in 2017. She was lost to follow up until 06/2020, repeat ultrasound showed heterogenous gland but no specific nodules  Reduced Metformin due to low GFR 01/2021  Aldo: renin normal 09/2020  Started Jardiance 09/2021 but this was discontinued by 07/2022 due to recurrent yeast infections  Metformin discontinued 07/2022 due to a GFR 31  Mounjaro  2.5 mg caused GI side effects 09/2023   SUBJECTIVE:   During the last visit (07/16/2022): A1c 9.5%     Today (06/04/2023): Ms. Corbo is here for a F/U on diabetes and MNG. She has NOT been to our clinic in 10 months.   She has not been checking glucose due to cost of sensors  and strips.    She was not taking her glycemic agents for ~ 4 weeks due to cost  Denies recent nausea or vomiting  Denies Constipation  or diarrhea  Denies palpitation  Denies local neck swelling    HOME DIABETES REGIMEN:  [name]  86 units once daily- 92 units  Fiasp 32 units  TIDQAC CF: Fiasp ( BG-130/20) TIDQAC Losartan 25 mg daily  Fenofibrate 145 mg daily  Rosuvastatin 20 mg daily      Statin: yes ACE-I/ARB: no Prior Diabetic Education: yes    CONTINUOUS GLUCOSE MONITORING RECORD INTERPRETATION    Dates of Recording: 3/12-3/25/2025  Sensor description: [employer] 2  Results statistics:   CGM use % of time 62  Average and SD 203/39.6  Time in range  42 %  % Time Above 180 31  % Time above 250 25  % Time Below target 2   Glycemic patterns summary: BG's trend down overnight and fluctuate during the day  Hyperglycemic episodes postprandial  Hypoglycemic episodes occurred at variable times without a pattern  Overnight periods: Trends down    DIABETIC COMPLICATIONS: Microvascular complications:  Neuropathy Denies: CKD, retinopathy Last Eye Exam: Completed 08/2021  Macrovascular complications:  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 Sherlyn Lick   Diabetes mellitus without complication (HCC)    Diverticulosis    Elevated alkaline phosphatase level    Fatty liver    Gallstones    Hiatal hernia    Hyperlipidemia  Hypertension    Internal hemorrhoids    Sleep apnea    2008- sleep study, neg. for sleep apnea    Stroke [facility])    SVT (supraventricular tachycardia) (HCC)    Symptomatic cholelithiasis 09/11/2018   Past Surgical History:  Past Surgical History:  Procedure Laterality Date   ANAL EXAMINATION UNDER ANESTHESIA  02/21/2011   anal fistula   BREAST EXCISIONAL BIOPSY Left    BREAST SURGERY  patient does not remember date of procedure   pull fluid off lft br   CHOLECYSTECTOMY N/A 09/12/2018   Procedure: LAPAROSCOPIC CHOLECYSTECTOMY;  Surgeon: Almond Lint,  MD;  [facility]: [facility];  Service: General;  Laterality: N/A;   COLONOSCOPY  10/08/2016   2 day prep 1 TA,   ELECTROPHYSIOLOGIC STUDY N/A 05/05/2015   Procedure: SVT Ablation;  Surgeon: Will Jorja Loa, MD;  [facility];  Service: Cardiovascular;  Laterality: N/A;   EP IMPLANTABLE DEVICE N/A 01/30/2016   Procedure: Loop Recorder Insertion;  Surgeon: Thurmon Fair, MD;  [facility];  Service: Cardiovascular;  Laterality: N/A;   INCISE AND DRAIN ABCESS     abscess on right thigh and buttock   KNEE ARTHROSCOPY     left   LAPAROSCOPIC APPENDECTOMY N/A 09/19/2018   Procedure: APPENDECTOMY LAPAROSCOPIC;  Surgeon: Berna Bue, MD;  Location: [facility];  Service: General;  Laterality: N/A;   SHOULDER SURGERY  04/14/2009   right   Social History:  reports that she has never smoked. She has never used smokeless tobacco. She reports that she does not drink alcohol and does not use drugs. Family History:  Family History  Problem Relation Age of Onset   Hypertension Mother    Alzheimer's disease Mother    Diabetes Father    Breast cancer Sister 28   Anesthesia problems Neg Hx    Hypotension Neg Hx    Malignant hyperthermia Neg Hx    Pseudochol deficiency Neg Hx    Colon cancer Neg Hx    Esophageal cancer Neg Hx    Stomach cancer Neg Hx    Rectal cancer Neg Hx    Thyroid disease Neg Hx      HOME MEDICATIONS: Allergies as of 06/04/2023       Reactions   Jardiance [empagliflozin] Other (See Comments)   Recurrent yeast infections    Mounjaro [tirzepatide] Other (See Comments)   "Stomach cramps" per patient    Ozempic (0.25 Or 0.5 Mg-dose) [semaglutide(0.25 Or 0.5mg -dos)]    Severe nausea, bad gas   Tramadol Nausea Only        Medication List        Accurate as of June 04, 2023  2:08 PM. If you have any questions, ask your nurse or doctor.          albuterol 108 (90 Base) MCG/ACT inhaler Commonly known as: VENTOLIN HFA Inhale 2 puffs  into the lungs every 6 (six) hours as needed for wheezing or shortness of breath.   aspirin EC 81 MG tablet Take 1 tablet (81 mg total) by mouth daily.   budesonide-formoterol 160-4.5 MCG/ACT inhaler Commonly known as: Symbicort Inhale 2 puffs into the lungs 2 (two) times daily.   calcium carbonate 600 MG Tabs tablet Commonly known as: Calcium 600 Take 1 tablet (600 mg total) by mouth 2 (two) times daily with a meal.   dicyclomine 10 MG capsule Commonly known as: BENTYL Take 2 capsules (20 mg total) by mouth 3 (three) times daily as  needed for spasms.   DULoxetine 60 MG capsule Commonly known as: CYMBALTA Take 1 capsule (60 mg total) by mouth 2 (two) times daily.   fenofibrate 145 MG tablet Commonly known as: TRICOR Take 1 tablet (145 mg total) by mouth daily.   ferrous gluconate 324 MG tablet Commonly known as: FERGON Take 1 tablet (324 mg total) by mouth daily with breakfast.   Fiasp FlexTouch 100 UNIT/ML FlexTouch Pen Generic drug: insulin aspart INJECT 36 UNITS INTO THE SKIN WITH BREAKFAST, WITH LUNCH, AND WITH EVENING MEAL *CONTACT  OFFICE  FOR  APPOINTMENT*   FreeStyle Libre 3 Plus Sensor Misc Change sensor every 15 days.   furosemide 80 MG tablet Commonly known as: LASIX Take 1 tablet (80 mg total) by mouth daily.   gabapentin 100 MG capsule Commonly known as: Neurontin Take 1 capsule (100 mg total) by mouth 3 (three) times daily.   Gvoke HypoPen 2-Pack 0.5 MG/0.1ML Soaj Generic drug: Glucagon Inject 1 Dose into the skin daily as needed.   losartan 25 MG tablet Commonly known as: COZAAR Take 1 tablet (25 mg total) by mouth daily.   metoprolol succinate 25 MG 24 hr tablet Commonly known as: TOPROL-XL Take 1 tablet (25 mg total) by mouth daily.   naloxone 4 MG/0.1ML Liqd nasal spray kit Commonly known as: NARCAN   nortriptyline 75 MG capsule Commonly known as: PAMELOR Take 1 capsule (75 mg total) by mouth at bedtime.   omeprazole 20 MG  capsule Commonly known as: PRILOSEC Take 20 mg by mouth daily.   ondansetron 4 MG disintegrating tablet Commonly known as: ZOFRAN-ODT Take 1 tablet (4 mg total) by mouth every 8 (eight) hours as needed for nausea or vomiting.   oxyCODONE-acetaminophen 7.5-325 MG tablet Commonly known as: Percocet Take 1 tablet by mouth every 6 (six) hours as needed. Please remove previous prescription, wrong year listed.   pantoprazole 40 MG tablet Commonly known as: PROTONIX Take 1 tablet (40 mg total) by mouth daily.   Pen Needles 32G X 6 MM Misc Use as instructed to inject insulin in the morning, at noon, in the evening, and at bedtime   potassium chloride 10 MEQ tablet Commonly known as: KLOR-CON Take 1 tablet (10 mEq total) by mouth 2 (two) times daily.   rosuvastatin 20 MG tablet Commonly known as: CRESTOR Take 1 tablet (20 mg total) by mouth daily.   sucralfate 1 g tablet Commonly known as: Carafate Take 1 tablet (1 g total) by mouth 4 (four) times daily.   [name] Max SoloStar 300 UNIT/ML Solostar Pen Generic drug: insulin glargine (2 Unit Dial) INJECT 86 UNITS INTO THE SKIN DAILY AT 6AM         OBJECTIVE:   Vital Signs: BP 120/70 (BP Location: Left Arm, Patient Position: Sitting, Cuff Size: Large)   Pulse 85   Ht 5' 3.5" (1.613 m)   Wt 248 lb (112.5 kg)   LMP 02/09/2017   SpO2 98%   BMI 43.24 kg/m   Wt Readings from Last 3 Encounters:  06/04/23 248 lb (112.5 kg)  05/16/23 235 lb (106.6 kg)  05/08/23 235 lb (106.6 kg)     Exam: General: Pt appears well and is in NAD Bilateral thyroid nodules were appreciated on today's exam  Lungs: Clear with good BS bilat  Heart: RRR   Extremities: Trace pretibial edema.   Neuro: MS is good with appropriate affect, pt is alert and Ox3      DM foot exam: 06/04/2023   The  skin of the feet is  without sores or ulcerations. The pedal pulses are 1+ on right and 1+ on left. The sensation is intact to a screening 5.07, 10 gram  monofilament bilaterally     DATA REVIEWED:  Lab Results  Component Value Date   HGBA1C 13.8 (H) 04/03/2023   HGBA1C 9.4 (A) 07/16/2022   HGBA1C 13.0 (A) 09/20/2021    Latest Reference Range & Units 04/03/23 16:19  Sodium 134 - 144 mmol/L 136  Potassium 3.5 - 5.2 mmol/L 4.2  Chloride 96 - 106 mmol/L 100  CO2 20 - 29 mmol/L 22  Glucose 70 - 99 mg/dL 409 (H)  BUN 8 - 27 mg/dL 22  Creatinine 8.11 - 9.14 mg/dL 7.82 (H)  Calcium 8.7 - 10.3 mg/dL 95.6  BUN/Creatinine Ratio 12 - 28  14  eGFR >59 mL/min/1.73 37 (L)  Phosphorus 3.0 - 4.3 mg/dL 4.1  Alkaline Phosphatase 44 - 121 IU/L 70  Albumin 3.9 - 4.9 g/dL 3.7 (L)  AST 0 - 40 IU/L 20  ALT 0 - 32 IU/L 19  Total Protein 6.0 - 8.5 g/dL 5.8 (L)  Total Bilirubin 0.0 - 1.2 mg/dL 0.3    Latest Reference Range & Units 04/03/23 16:19  Glucose 70 - 99 mg/dL 213 (H)  Hemoglobin Y8M 4.8 - 5.6 % 13.8 (H)  Est. 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 No discrete nodules are identifiable separate from the background thyroid parenchyma on today's examination. Previously biopsied nodules now blend in with the background thyroid parenchyma.   IMPRESSION: Slight interval enlargement of the overall thyroid gland which is diffusely heterogeneous consistent with diffuse goitrous replacement.   Previously biopsied nodules are now no longer evident and have blended in with the background thyroid parenchyma. No discrete nodules or new suspicious findings identified.   FNA isthmic nodule 1.8 cm  03/31/2015 Scant epithelium     FNA right lower nodule 4.8 cm 03/31/2015 Scant epithelium   ASSESSMENT / PLAN / RECOMMENDATIONS:   1) Type 2 Diabetes Mellitus, poorly  controlled, With Neuropathic, CKD III  and macrovascular complications - Most recent A1c of 10.2 %. Goal A1c <7.0%.     - A1c continues to be above goal -Intolerant to [place] due to recurrent yeast infections -Intolerant to Ozempic and Mounjaro 2.5 mg dose -We stopped  metformin due to GFR <35 -Not a candidate for pioglitazone due to lower extremity edema -Insulins have been cost prohibitive, as well as CGM technology -Patient was asked to go online and search for [employer], I showed her on the Internet her to go through this process, as she does have Nurse, learning disability and would not qualify for patient assistance -A prescription for Dexcom was sent to the pharmacy as this appears to be on the formulary -I will adjust [name] and Fiasp as below  MEDICATIONS:  Decrease [name] 84 units once daily Increase Fiasp 36 units TIDQAC Continue CF: Fiasp ( BG-130/20) TIDQAC  EDUCATION / INSTRUCTIONS: BG monitoring instructions: Patient is instructed to check her blood sugars 3 times a day, before meals. Call [facility] if: BG persistently < 70  I reviewed the Rule of 15 for the treatment of hypoglycemia in detail with the  patient. Literature supplied.   2) Diabetic complications:  Eye: Does not have known diabetic retinopathy.  Neuro/ Feet: Does  have known diabetic peripheral neuropathy .  Renal: Patient does not have known baseline CKD. She   is  on an ACEI/ARB at present.    3) MNG:    -No local neck symptoms - She is S/P FNA of the isthmic and right nodule in 2017 with scan cellularity , she was lost to follow up until 07/2020 when a repeat Thyroid ultrasound showed increase thyroid heterogeneity but no actual thyroid nodules, previously biopsied nodules have blended in with a heterogeneous background   4) Subclinical Hyperthyroidism:  -Patient is asymptomatic -TSH levels are not low enough to warrant dynamite treatment at this time -Will recheck  next visit  5) CKD III:  -GFR has been trending down -I started her on ARB in 2024  Medication Continue losartan 25 mg daily    F/U in 3 months   Signed electronically by: Lyndle Herrlich, MD  [facility] Endocrinology  [facility] [address] [place] [place], [place] [address] Phone: [phone] FAX: [phone]   CC: Jac Canavan, PA-C [address] [place] [address] Phone: [phone]  Fax: [phone]  Return to Endocrinology clinic as below: Future Appointments  Date Time Provider Department Center  07/16/2023  3:20 PM Jones Bales, NP CPR[facility] CPR  10/07/2023  8:15 AM Tysinger, Kermit Balo, PA-C [facility]

## 2023-06-04 NOTE — Patient Instructions (Addendum)
-   Decrease [name] 84 units once daily  - Increase  Fiasp 36 units with each meal    -Fiasp correctional insulin: ADD extra units on insulin to your meal-time Fiasp dose if your blood sugars are higher than 150. Use the scale below to help guide you:   Blood sugar before meal Number of units to inject  Less than 150 0 unit  151 -  170 1 units  171 -  190 2 units  191 -  210 3 units  211 -  230 4 units  231 -  250 5 units  251 -  270 6 units  271 -  290 7 units  291 -  310 8 units  311 - 330 9 units  331 - 350 10 units  351 - 370 11 units   371 - 390 12 units  391 - 410 13 units         HOW TO TREAT LOW BLOOD SUGARS (Blood sugar LESS THAN 70 MG/DL) Please follow the RULE OF 15 for the treatment of hypoglycemia treatment (when your (blood sugars are less than 70 mg/dL)   STEP 1: Take 15 grams of carbohydrates when your blood sugar is low, which includes:  3-4 GLUCOSE TABS  OR 3-4 OZ OF JUICE OR REGULAR SODA OR ONE TUBE OF GLUCOSE GEL    STEP 2: RECHECK blood sugar in 15 MINUTES STEP 3: If your blood sugar is still low at the 15 minute recheck --> then, go back to STEP 1 and treat AGAIN with another 15 grams of carbohydrates.

## 2023-06-04 NOTE — Telephone Encounter (Signed)
 Patient requested at check out that an RX for [place] be sent to [facility] -  patient advises is almost out

## 2023-06-07 ENCOUNTER — Telehealth: Payer: Self-pay | Admitting: Pharmacy Technician

## 2023-06-07 ENCOUNTER — Other Ambulatory Visit (HOSPITAL_COMMUNITY): Payer: Self-pay

## 2023-06-07 ENCOUNTER — Encounter (INDEPENDENT_AMBULATORY_CARE_PROVIDER_SITE_OTHER): Payer: Self-pay | Admitting: Pharmacy Technician

## 2023-06-07 NOTE — Telephone Encounter (Signed)
 Pharmacy Patient Advocate Encounter  Received notification from [facility] that Prior Authorization for Dexcom G7 sensor has been APPROVED through 06/06/24   PA #/Case ID/Reference #: [id number]

## 2023-06-07 NOTE — Telephone Encounter (Signed)
 Pharmacy Patient Advocate Encounter   Received notification from CoverMyMeds that prior authorization for Dexcom G7 Sensor  is required/requested.   Insurance verification completed.   The patient is insured through [facility] .   Per test claim: PA required; PA submitted to above mentioned insurance via CoverMyMeds Key/confirmation #/EOC [id number] Status is pending

## 2023-06-07 NOTE — Telephone Encounter (Signed)
error 

## 2023-06-17 ENCOUNTER — Telehealth: Payer: Self-pay | Admitting: Registered Nurse

## 2023-06-17 NOTE — Telephone Encounter (Signed)
 Received a call from [name]  She discontinued her Gabapentin on Friday 06/14/2023 as instructed.  She is currently receiving 3.5 hours o relief with her current medication regimen. We will increase her Oycodone to 5 times a day as needed for pain. She was instructed to send a My-Chart message on 04/10/ 2025, with a update on medication change. She verbalizes understanding.

## 2023-06-17 NOTE — Telephone Encounter (Signed)
 Patient needs to speak to [facility] about changing her medication.

## 2023-06-21 ENCOUNTER — Telehealth: Payer: Self-pay | Admitting: Registered Nurse

## 2023-06-21 MED ORDER — OXYCODONE-ACETAMINOPHEN 7.5-325 MG PO TABS: 1.0000 | ORAL_TABLET | Freq: Every day | ORAL | 0 refills | Status: DC | PRN

## 2023-06-21 NOTE — Telephone Encounter (Signed)
 Patient called in states the Oxycodone is lasting 3 1/2 -4 hrs and would like to keep taking 5 times a days as needed per last conversation . She said she sent a mychart message but I told patient I did not see one in the system

## 2023-06-21 NOTE — Telephone Encounter (Signed)
 PMP was Reviewed.  Oxycodone prescription sent to pharmacy Ms. Ranker is aware of the above, via My-Chart

## 2023-06-25 ENCOUNTER — Other Ambulatory Visit: Payer: Self-pay | Admitting: Registered Nurse

## 2023-07-04 ENCOUNTER — Other Ambulatory Visit: Payer: Self-pay | Admitting: Internal Medicine

## 2023-07-16 ENCOUNTER — Encounter: Attending: Registered Nurse | Admitting: Registered Nurse

## 2023-07-16 ENCOUNTER — Encounter: Payer: Self-pay | Admitting: Registered Nurse

## 2023-07-16 VITALS — BP 116/66 | HR 81 | Ht 63.5 in | Wt 241.2 lb

## 2023-07-16 DIAGNOSIS — M7061 Trochanteric bursitis, right hip: Secondary | ICD-10-CM | POA: Diagnosis not present

## 2023-07-16 DIAGNOSIS — K5901 Slow transit constipation: Secondary | ICD-10-CM | POA: Insufficient documentation

## 2023-07-16 DIAGNOSIS — M47817 Spondylosis without myelopathy or radiculopathy, lumbosacral region: Secondary | ICD-10-CM | POA: Insufficient documentation

## 2023-07-16 DIAGNOSIS — Z79891 Long term (current) use of opiate analgesic: Secondary | ICD-10-CM | POA: Diagnosis present

## 2023-07-16 DIAGNOSIS — M629 Disorder of muscle, unspecified: Secondary | ICD-10-CM | POA: Insufficient documentation

## 2023-07-16 DIAGNOSIS — M7062 Trochanteric bursitis, left hip: Secondary | ICD-10-CM | POA: Insufficient documentation

## 2023-07-16 DIAGNOSIS — M47816 Spondylosis without myelopathy or radiculopathy, lumbar region: Secondary | ICD-10-CM | POA: Insufficient documentation

## 2023-07-16 DIAGNOSIS — G894 Chronic pain syndrome: Secondary | ICD-10-CM | POA: Insufficient documentation

## 2023-07-16 DIAGNOSIS — M5416 Radiculopathy, lumbar region: Secondary | ICD-10-CM | POA: Insufficient documentation

## 2023-07-16 DIAGNOSIS — Z5181 Encounter for therapeutic drug level monitoring: Secondary | ICD-10-CM | POA: Diagnosis present

## 2023-07-16 MED ORDER — OXYCODONE-ACETAMINOPHEN 7.5-325 MG PO TABS: 1.0000 | ORAL_TABLET | Freq: Every day | ORAL | 0 refills | Status: DC | PRN

## 2023-07-16 NOTE — Progress Notes (Signed)
 Subjective:    Patient ID: [name], female    DOB: [birth year], 63 y.o.   MRN: [id number]  HPI: [name] is a 63 y.o. female who returns for follow up appointment for chronic pain and medication refill. She states her pain is located in her lower back radiating into her bilateral lower extremities. She rates her pain 7. Her current exercise regime is walking and performing stretching exercises.  [name] Morphine  equivalent is 56.25 MME.       Pain Inventory Average Pain 8 Pain Right Now 7 My pain is sharp, stabbing, and aching  In the last 24 hours, has pain interfered with the following? General activity 9 Relation with others 9 Enjoyment of life 10 What TIME of day is your pain at its worst? daytime, evening, and night Sleep (in general) Poor  Pain is worse with: walking, bending, standing, and some activites Pain improves with: rest, heat/ice, and medication Relief from Meds: 8  Family History  Problem Relation Age of Onset  . Hypertension Mother   . Alzheimer's disease Mother   . Diabetes Father   . Breast cancer Sister 59  . Anesthesia problems Neg Hx   . Hypotension Neg Hx   . Malignant hyperthermia Neg Hx   . Pseudochol deficiency Neg Hx   . Colon cancer Neg Hx   . Esophageal cancer Neg Hx   . Stomach cancer Neg Hx   . Rectal cancer Neg Hx   . Thyroid  disease Neg Hx    Social History   Socioeconomic History  . Marital status: Divorced    Spouse name: Not on file  . Number of children: 0  . Years of education: Not on file  . Highest education level: Not on file  Occupational History  . Occupation: TEACHER ASSISTANT  Tobacco Use  . Smoking status: Never  . Smokeless tobacco: Never  Vaping Use  . Vaping status: Never Used  Substance and Sexual Activity  . Alcohol use: No  . Drug use: No  . Sexual activity: Yes  Other Topics Concern  . Not on file  Social History Narrative  . Not on file   Social Drivers of Health    Financial Resource Strain: Not on file  Food Insecurity: Food Insecurity Present (09/03/2019)   Hunger Vital Sign   . Worried About Programme researcher, broadcasting/film/video in the Last Year: Sometimes true   . Ran Out of Food in the Last Year: Sometimes true  Transportation Needs: No Transportation Needs (09/03/2019)   PRAPARE - Transportation   . Lack of Transportation (Medical): No   . Lack of Transportation (Non-Medical): No  Physical Activity: Not on file  Stress: Not on file  Social Connections: Not on file   Past Surgical History:  Procedure Laterality Date  . ANAL EXAMINATION UNDER ANESTHESIA  02/21/2011   anal fistula  . BREAST EXCISIONAL BIOPSY Left   . BREAST SURGERY  patient does not remember date of procedure   pull fluid off lft br  . CHOLECYSTECTOMY N/A 09/12/2018   Procedure: LAPAROSCOPIC CHOLECYSTECTOMY;  Surgeon: Lockie Rima, MD;  Location: [facility] OR;  Service: General;  Laterality: N/A;  . COLONOSCOPY  10/08/2016   2 day prep 1 TA,  . ELECTROPHYSIOLOGIC STUDY N/A 05/05/2015   Procedure: SVT Ablation;  Surgeon: Will Cortland Ding, MD;  Location: [facility] INVASIVE [facility];  Service: Cardiovascular;  Laterality: N/A;  . EP IMPLANTABLE DEVICE N/A 01/30/2016   Procedure: Loop Recorder Insertion;  Surgeon: Karyl Paget Croitoru,  MD;  Location: [facility];  Service: Cardiovascular;  Laterality: N/A;  . INCISE AND DRAIN ABCESS     abscess on right thigh and buttock  . KNEE ARTHROSCOPY     left  . LAPAROSCOPIC APPENDECTOMY N/A 09/19/2018   Procedure: APPENDECTOMY LAPAROSCOPIC;  Surgeon: Adalberto Acton, MD;  Location: [facility] OR;  Service: General;  Laterality: N/A;  . SHOULDER SURGERY  04/14/2009   right   Past Surgical History:  Procedure Laterality Date  . ANAL EXAMINATION UNDER ANESTHESIA  02/21/2011   anal fistula  . BREAST EXCISIONAL BIOPSY Left   . BREAST SURGERY  patient does not remember date of procedure   pull fluid off lft br  . CHOLECYSTECTOMY N/A 09/12/2018   Procedure:  LAPAROSCOPIC CHOLECYSTECTOMY;  Surgeon: Lockie Rima, MD;  Location: [facility] OR;  Service: General;  Laterality: N/A;  . COLONOSCOPY  10/08/2016   2 day prep 1 TA,  . ELECTROPHYSIOLOGIC STUDY N/A 05/05/2015   Procedure: SVT Ablation;  Surgeon: Will Cortland Ding, MD;  Location: [facility];  Service: Cardiovascular;  Laterality: N/A;  . EP IMPLANTABLE DEVICE N/A 01/30/2016   Procedure: Loop Recorder Insertion;  Surgeon: Luana Rumple, MD;  Location: [facility];  Service: Cardiovascular;  Laterality: N/A;  . INCISE AND DRAIN ABCESS     abscess on right thigh and buttock  . KNEE ARTHROSCOPY     left  . LAPAROSCOPIC APPENDECTOMY N/A 09/19/2018   Procedure: APPENDECTOMY LAPAROSCOPIC;  Surgeon: Adalberto Acton, MD;  Location: [facility] OR;  Service: General;  Laterality: N/A;  . SHOULDER SURGERY  04/14/2009   right   Past Medical History:  Diagnosis Date  . Abscess    increased drainage from abscess on buttock  . Anal fistula   . Anxiety   . Bilateral hip pain 05/27/2015  . Chronic pain syndrome 05/27/2015  . Depression    sees Dr. Delice Felt  . Diabetes mellitus without complication (HCC)   . Diverticulosis   . Elevated alkaline phosphatase level   . Fatty liver   . Gallstones   . Hiatal hernia   . Hyperlipidemia   . Hypertension   . Internal hemorrhoids   . Sleep apnea    2008- sleep study, neg. for sleep apnea   . Stroke (HCC)   . SVT (supraventricular tachycardia) (HCC)   . Symptomatic cholelithiasis 09/11/2018   BP 116/66 (BP Location: Left Arm, Patient Position: Sitting, Cuff Size: Large)   Pulse 81   Ht 5' 3.5" (1.613 m)   Wt 241 lb 3.2 oz (109.4 kg)   LMP 02/09/2017   SpO2 95%   BMI 42.06 kg/m   Opioid Risk Score:   Fall Risk Score:  `1  Depression screen [facility] 2/9     05/08/2023    3:26 PM 03/21/2023    3:07 PM 01/16/2023    2:50 PM 11/15/2022    3:15 PM 09/17/2022    1:41 PM 09/05/2022    2:16 PM 07/17/2022    3:22 PM  Depression screen PHQ 2/9  Decreased  Interest 0 0 0 0 0 0 0  Down, Depressed, Hopeless 0 0 0 0 0 0 0  PHQ - 2 Score 0 0 0 0 0 0 0     Review of Systems  Musculoskeletal:  Positive for arthralgias, back pain and myalgias.       Lower back pain radiating into bilateral legs  All other systems reviewed and are negative.      Objective:  Physical Exam Vitals and nursing note reviewed.  Constitutional:      Appearance: Normal appearance.  Cardiovascular:     Rate and Rhythm: Regular rhythm.     Pulses: Normal pulses.     Heart sounds: Normal heart sounds.  Pulmonary:     Effort: Pulmonary effort is normal.     Breath sounds: Normal breath sounds.  Musculoskeletal:     Right lower leg: Edema present.     Left lower leg: Edema present.     Comments: Normal Muscle Bulk and Muscle Testing Reveals:  Upper Extremities: Full ROM and Muscle Strength 5/5 Lumbar Paraspinal Tenderness: L-3-L-5  Bilateral Greater Trochanter Tenderness Lower Extremities: Full ROM and Muscle Strength 5/5 Arises from Table Slowly Narrow Based  Gait     Skin:    General: Skin is warm and dry.  Neurological:     Mental Status: She is alert and oriented to person, place, and time.  Psychiatric:        Mood and Affect: Mood normal.        Behavior: Behavior normal.         Assessment & Plan:  1.Chronic Bilateral Hip pain L>R---endstage OA of hips..Continue HEP as tolerated and Heat and Ice Therapy. 07/16/2023 Refilled: Oxycodone  7.5/325 one tablet 4 times a day as needed for moderate pain #120. Second script sent for the following month.07/16/2023 We will continue the opioid monitoring program, this consists of regular clinic visits, examinations, urine drug screen, pill counts as well as use of [place]  Controlled Substance Reporting system. A 12 month History has been reviewed on the [place]  Controlled Substance Reporting System on 07/16/2023 2. Lumbar Radiculitis:  Continue medication regimen with Pamelor . Continue Cymbalta   and Continue  HEP as Tolerated.  Continue to Monitor. 07/16/2023.          -              3. Morbid obesity: Continue with healthy diet Regime and HEP. Encouraged to continue with healthy diet regime and HEP. 07/16/2023 4.Bilateral  Greater Trochanteric Bursitis:.Continue with Heat and Ice Therapy. Continue to Monitor. 07/16/2023.   F/U in 2 months

## 2023-07-17 ENCOUNTER — Ambulatory Visit (INDEPENDENT_AMBULATORY_CARE_PROVIDER_SITE_OTHER): Admitting: Medical

## 2023-07-17 ENCOUNTER — Encounter: Payer: Self-pay | Admitting: Medical

## 2023-07-17 ENCOUNTER — Ambulatory Visit: Payer: Self-pay

## 2023-07-17 VITALS — BP 130/70 | HR 80 | Temp 97.5°F | Wt 242.0 lb

## 2023-07-17 DIAGNOSIS — E1142 Type 2 diabetes mellitus with diabetic polyneuropathy: Secondary | ICD-10-CM

## 2023-07-17 DIAGNOSIS — N1832 Chronic kidney disease, stage 3b: Secondary | ICD-10-CM | POA: Diagnosis not present

## 2023-07-17 DIAGNOSIS — R7989 Other specified abnormal findings of blood chemistry: Secondary | ICD-10-CM

## 2023-07-17 DIAGNOSIS — I10 Essential (primary) hypertension: Secondary | ICD-10-CM | POA: Diagnosis not present

## 2023-07-17 DIAGNOSIS — Z794 Long term (current) use of insulin: Secondary | ICD-10-CM

## 2023-07-17 DIAGNOSIS — M7989 Other specified soft tissue disorders: Secondary | ICD-10-CM

## 2023-07-17 NOTE — Progress Notes (Signed)
 Subjective:  [name] is a 63 y.o. female who presents for Chief Complaint  Patient presents with   Acute Visit    Leg and ankle swelling since Thursday or Friday     Here for c/o worse leg and ankle swelling both legs since about 5 days ago.  Uses potassium BID.  Takes Lasix  80mg  daily.  No SOB or wheezing.  Has some achiness in both legs.  No urinary changes.   Denies excess salt intake.  No change in activity, not more sedentary of late.   No chest pain.  Sees endocrinology, recently was able to get Jardiance  added back on although she has had a very difficult time with insurance approval to start diabetic medications.  She notes that she has never had swelling this bad in her legs.  No recent weight fluctuations though.  No other new concerns  She is compliant with her medicines for blood pressure  She does not use compression hose.  She notes that she is compliant with Lasix  80 mg daily and potassium 10 twice daily  No other aggravating or relieving factors.    No other c/o.  The following portions of the patient's history were reviewed and updated as appropriate: allergies, current medications, past family history, past medical history, past social history, past surgical history and problem list.  ROS Otherwise as in subjective above  Objective: BP 130/70   Pulse 80   Temp (!) 97.5 F (36.4 C)   Wt 242 lb (109.8 kg)   LMP 02/09/2017   BMI 42.20 kg/m   Wt Readings from Last 3 Encounters:  07/17/23 242 lb (109.8 kg)  07/16/23 241 lb 3.2 oz (109.4 kg)  06/04/23 248 lb (112.5 kg)   BP Readings from Last 3 Encounters:  07/17/23 130/70  07/16/23 116/66  06/04/23 120/70    General appearance: alert, no distress, well developed, well nourished Neck: supple, no lymphadenopathy, positive thyromegaly, no masses, negative JVD or bruits Heart: RRR, normal S1, S2, no murmurs Lungs: CTA bilaterally, no wheezes, rhonchi, or rales Abdomen: +bs, soft, non tender,  non distended, no masses, no hepatomegaly, no splenomegaly Pulses: 2+ radial pulses, 1+ pedal pulses, normal cap refill Ext: 2+ nonpitting lower extremity edema bilaterally including feet and lower legs    Assessment: Encounter Diagnoses  Name Primary?   Leg swelling Yes   Essential hypertension, benign    CKD stage 3b, GFR 30-44 ml/min (HCC)    Type 2 diabetes mellitus with diabetic polyneuropathy, with long-term current use of insulin  (HCC)    Abnormal thyroid  blood test      Plan: Leg swelling-we discussed various causes.  Labs as below.  She will continue Lasix  80 mg in the morning and potassium twice a day.  She can use an extra 1/2 tablet of Lasix  this afternoon.  Elevate the legs when possible.  Consider compression hose every day.  We will call tomorrow with results on some of the test.  Hypertension-continue losartan  25 mg daily, Toprol -XL 25 mg daily, Lasix  80 mg daily  Diabetes-managed by endocrinology, uncontrolled with multiple complications.  She has not tolerated certain medications, several medicines are not covered by insurance, and she continues to have poor control.  Very difficult to get her sugars under control.    Abnormal thyroid  test, subclinical hyperthyroidism and goiter-managed endocrinology, update labs today given the swelling  Avoid excess salt in the diet, we discussed the role of compression hose, exercise, leg elevation, and working on getting the sugars under  better control.  We will likely need to get nephrology involved in the near future    [name] was seen today for acute visit.  Diagnoses and all orders for this visit:  Leg swelling -     Basic metabolic panel with GFR -     Brain natriuretic peptide -     TSH + free T4  Essential hypertension, benign  CKD stage 3b, GFR 30-44 ml/min (HCC)  Type 2 diabetes mellitus with diabetic polyneuropathy, with long-term current use of insulin  (HCC)  Abnormal thyroid  blood test -     TSH + free  T4    Follow up: Pending labs

## 2023-07-17 NOTE — Telephone Encounter (Signed)
 Copied from CRM [id number]. Topic: Clinical - Red Word Triage >> Jul 17, 2023  9:09 AM Hamp Levine R wrote: [facility] that prompted transfer to Nurse Triage: Patient has swelling and pain in both of her ankles and legs. Started 5/2 and has progressively has gotten worse.   Chief Complaint: Leg Swelling Symptoms: Hip Pain,   Frequency: Since Sunday  Pertinent Negatives: Patient denies chest pain, dyspnea  Disposition: [] ED /[] Urgent Care (no appt availability in office) / [x] Appointment(In office/virtual)/ []  [facility]/ [] Home Care/ [] Refused Recommended Disposition /[] [facility]/ []  Follow-up with PCP Additional Notes: JS is being triaged for leg swelling and ankle swelling in both legs. The patient denies redness, but reports pain with walking. Denies any chest pain, redness, or dyspnea. In office appointment made.   Reason for Disposition  [1] MODERATE leg swelling (e.g., swelling extends up to knees) AND [2] new-onset or worsening  Answer Assessment - Initial Assessment Questions 1. ONSET: "When did the swelling start?" (e.g., minutes, hours, days)     07-12-2023 2. LOCATION: "What part of the leg is swollen?"  "Are both legs swollen or just one leg?"     Knee down on both legs  3. SEVERITY: "How bad is the swelling?" (e.g., localized; mild, moderate, severe)   - Localized: Small area of swelling localized to one leg.   - MILD pedal edema: Swelling limited to foot and ankle, pitting edema < 1/4 inch (6 mm) deep, rest and elevation eliminate most or all swelling.   - MODERATE edema: Swelling of lower leg to knee, pitting edema > 1/4 inch (6 mm) deep, rest and elevation only partially reduce swelling.   - SEVERE edema: Swelling extends above knee, facial or hand swelling present.      Moderate  4. REDNESS: "Does the swelling look red or infected?"     No  5. PAIN: "Is the swelling painful to touch?" If Yes, ask: "How painful is it?"   (Scale 1-10; mild, moderate or  severe)     Moderate with walking  6. FEVER: "Do you have a fever?" If Yes, ask: "What is it, how was it measured, and when did it start?"      No  7. CAUSE: "What do you think is causing the leg swelling?"     Unsure  8. MEDICAL HISTORY: "Do you have a history of blood clots (e.g., DVT), cancer, heart failure, kidney disease, or liver failure?"     SVT  9. RECURRENT SYMPTOM: "Have you had leg swelling before?" If Yes, ask: "When was the last time?" "What happened that time?"     No  10. OTHER SYMPTOMS: "Do you have any other symptoms?" (e.g., chest pain, difficulty breathing)       BLE pain, Hip Pain,  Protocols used: Leg Swelling and Edema-A-AH

## 2023-07-18 ENCOUNTER — Ambulatory Visit: Admitting: Family Medicine

## 2023-07-18 ENCOUNTER — Telehealth: Payer: Self-pay | Admitting: *Deleted

## 2023-07-18 NOTE — Telephone Encounter (Signed)
 Critical labs on patient glucose 526.

## 2023-07-18 NOTE — Progress Notes (Signed)
 Unfortunately her labs came back showing blood sugar over 500 which is quite high.  Kidney marker is worse than usual as well.  The heart marker BNP is not back yet.  I recommend she go to the emergency department.  Limited blood sugars at this time she is at risk for things going bad really quick in terms of her kidneys and her body's ability to control blood sugars.  They usually will need to do some treatment at Emergency Department to get the sugars down out of the danger zone.  Please have her return to the emergency department.  I would recommend going to [facility] emergency department

## 2023-07-22 NOTE — Progress Notes (Signed)
 Please call.  We requested that she go to the emergency department Friday given blood sugars greater than 500.  I am not seeing a hospital note.  I wanted to make sure she was seen and had treatment to help get the sugars down

## 2023-07-23 ENCOUNTER — Ambulatory Visit: Payer: Self-pay | Admitting: Internal Medicine

## 2023-07-23 ENCOUNTER — Telehealth: Payer: Self-pay

## 2023-07-23 NOTE — Telephone Encounter (Signed)
 Pt agreed, scheduled her for ct entero at [facility] 08/01/23@12 :30pm, arrival time 11:30am. Pt states she needs a later appt due to work. She will call [phone] to reschedule the appt to a date and time that works for her.

## 2023-07-23 NOTE — Telephone Encounter (Signed)
 Pt had ct entero ordered in October but has never scheduled appt. Do you still want pt to have ct entero done? Please advise.

## 2023-07-23 NOTE — Telephone Encounter (Signed)
 This was originally recommended to follow-up an abnormal CT of the small intestine Thus it is still recommended if she is agreeable Diagnosis code would be abnormal CT small bowel

## 2023-07-26 LAB — BASIC METABOLIC PANEL WITH GFR
BUN/Creatinine Ratio: 18 (ref 12–28)
BUN: 33 mg/dL — ABNORMAL HIGH (ref 8–27)
CO2: 21 mmol/L (ref 20–29)
Calcium: 10.1 mg/dL (ref 8.7–10.3)
Chloride: 95 mmol/L — ABNORMAL LOW (ref 96–106)
Creatinine, Ser: 1.87 mg/dL — ABNORMAL HIGH (ref 0.57–1.00)
Glucose: 526 mg/dL (ref 70–99)
Potassium: 4.4 mmol/L (ref 3.5–5.2)
Sodium: 133 mmol/L — ABNORMAL LOW (ref 134–144)
eGFR: 30 mL/min/{1.73_m2} — ABNORMAL LOW (ref >59)

## 2023-07-26 LAB — BRAIN NATRIURETIC PEPTIDE

## 2023-07-26 LAB — TSH+FREE T4
Free T4: 0.94 ng/dL (ref 0.82–1.77)
TSH: 0.366 u[IU]/mL — ABNORMAL LOW (ref 0.450–4.500)

## 2023-08-01 ENCOUNTER — Ambulatory Visit (HOSPITAL_COMMUNITY)

## 2023-08-06 ENCOUNTER — Ambulatory Visit (INDEPENDENT_AMBULATORY_CARE_PROVIDER_SITE_OTHER): Admitting: Medical

## 2023-08-06 VITALS — BP 120/64 | HR 74 | Wt 244.2 lb

## 2023-08-06 DIAGNOSIS — R609 Edema, unspecified: Secondary | ICD-10-CM

## 2023-08-06 DIAGNOSIS — Z8639 Personal history of other endocrine, nutritional and metabolic disease: Secondary | ICD-10-CM

## 2023-08-06 DIAGNOSIS — N1832 Chronic kidney disease, stage 3b: Secondary | ICD-10-CM

## 2023-08-06 DIAGNOSIS — E1159 Type 2 diabetes mellitus with other circulatory complications: Secondary | ICD-10-CM

## 2023-08-06 DIAGNOSIS — E785 Hyperlipidemia, unspecified: Secondary | ICD-10-CM

## 2023-08-06 DIAGNOSIS — I4719 Other supraventricular tachycardia: Secondary | ICD-10-CM | POA: Diagnosis not present

## 2023-08-06 DIAGNOSIS — I10 Essential (primary) hypertension: Secondary | ICD-10-CM

## 2023-08-06 DIAGNOSIS — Z794 Long term (current) use of insulin: Secondary | ICD-10-CM

## 2023-08-06 DIAGNOSIS — Z87898 Personal history of other specified conditions: Secondary | ICD-10-CM

## 2023-08-06 MED ORDER — SPIRONOLACTONE 25 MG PO TABS: 12.5000 mg | ORAL_TABLET | Freq: Every day | ORAL | 0 refills | Status: DC

## 2023-08-06 MED ORDER — METOPROLOL SUCCINATE ER 25 MG PO TB24: 25.0000 mg | ORAL_TABLET | Freq: Every day | ORAL | 0 refills | Status: DC

## 2023-08-06 MED ORDER — DULOXETINE HCL 60 MG PO CPEP: 60.0000 mg | ORAL_CAPSULE | Freq: Two times a day (BID) | ORAL | 0 refills | Status: DC

## 2023-08-06 MED ORDER — TOUJEO MAX SOLOSTAR 300 UNIT/ML ~~LOC~~ SOPN: 83.0000 [IU] | PEN_INJECTOR | Freq: Every day | SUBCUTANEOUS | 2 refills | Status: DC

## 2023-08-06 MED ORDER — BUDESONIDE-FORMOTEROL FUMARATE 160-4.5 MCG/ACT IN AERO: 2.0000 | INHALATION_SPRAY | Freq: Two times a day (BID) | RESPIRATORY_TRACT | 2 refills | Status: DC

## 2023-08-06 MED ORDER — POTASSIUM CHLORIDE ER 10 MEQ PO TBCR: 10.0000 meq | EXTENDED_RELEASE_TABLET | Freq: Two times a day (BID) | ORAL | 0 refills | Status: DC

## 2023-08-06 MED ORDER — FERROUS GLUCONATE 324 (38 FE) MG PO TABS: 324.0000 mg | ORAL_TABLET | Freq: Every day | ORAL | 0 refills | Status: AC

## 2023-08-06 MED ORDER — PANTOPRAZOLE SODIUM 40 MG PO TBEC: 40.0000 mg | DELAYED_RELEASE_TABLET | Freq: Every day | ORAL | 0 refills | Status: DC

## 2023-08-06 MED ORDER — ROSUVASTATIN CALCIUM 20 MG PO TABS: 20.0000 mg | ORAL_TABLET | Freq: Every day | ORAL | 0 refills | Status: DC

## 2023-08-06 MED ORDER — FENOFIBRATE 145 MG PO TABS
145.0000 mg | ORAL_TABLET | Freq: Every day | ORAL | 0 refills | Status: DC
Start: 2023-08-06 — End: 2023-09-04

## 2023-08-06 MED ORDER — ALBUTEROL SULFATE HFA 108 (90 BASE) MCG/ACT IN AERS: 2.0000 | INHALATION_SPRAY | Freq: Four times a day (QID) | RESPIRATORY_TRACT | 1 refills | Status: AC | PRN

## 2023-08-06 MED ORDER — FIASP FLEXTOUCH 100 UNIT/ML ~~LOC~~ SOPN: 40.0000 [IU] | PEN_INJECTOR | Freq: Three times a day (TID) | SUBCUTANEOUS | 2 refills | Status: DC

## 2023-08-06 MED ORDER — FUROSEMIDE 80 MG PO TABS: 80.0000 mg | ORAL_TABLET | Freq: Every day | ORAL | 0 refills | Status: DC

## 2023-08-06 NOTE — Progress Notes (Signed)
 Subjective: Chief Complaint  Patient presents with   Medical Management of Chronic Issues    Med check. Feet are still swelling. BS still around 150-175   Here for recheck on diabetes, sugars.  Last visit 20 days ago glucose was over 500.  She was advised to go to the emergency dept but refused.   Days later she noted her sugars were running 175-230, but also had even seen some low readings into the 50s.    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  In general she notes that she normally doesn't eat candy and sweets.   She does however drink a regular soda daily.  She has tried to cut back on high carb foods  She has seen several recent readings under 70.  Using dexcom.     Currently she is using [name]  86 units daily.  Using Fiasp  fast acting insulin  35 u TID.  Her dexcom shows 14 day average of 218 glucose, 30 and 90 day average 253 glucose.  38% in normal range, <1% in low range, 28 % high range, and 33% very high range.   Eats 3 times daily, some times 2 times daily  She also c/o ongoing swelling in legs despite using compression hose, lasix , and walking.  No CP or SOB.   No other aggravating or relieving factors. No other complaint.  Past Medical History:  Diagnosis Date   Abscess    increased drainage from abscess on buttock   Anal fistula    Anxiety    Bilateral hip pain 05/27/2015   Chronic pain syndrome 05/27/2015   Depression    sees Dr. Delice Felt   Diabetes mellitus without complication [facility])    Diverticulosis    Elevated alkaline phosphatase level    Fatty liver    Gallstones    Hiatal hernia    Hyperlipidemia    Hypertension    Internal hemorrhoids    Sleep apnea    2008- sleep study, neg. for sleep apnea    Stroke [facility])    SVT (supraventricular tachycardia) (HCC)    Symptomatic cholelithiasis 09/11/2018   Current Outpatient Medications on File Prior to Visit  Medication Sig  Dispense Refill   aspirin  EC 81 MG tablet Take 1 tablet (81 mg total) by mouth daily. 90 tablet 3   calcium  carbonate (CALCIUM  600) 600 MG TABS tablet Take 1 tablet (600 mg total) by mouth 2 (two) times daily with a meal. 180 tablet 3   Continuous Glucose Sensor (DEXCOM G7 SENSOR) MISC 1 Device by Does not apply route as directed. 9 each 3   dicyclomine  (BENTYL ) 10 MG capsule Take 2 capsules (20 mg total) by mouth 3 (three) times daily as needed for spasms. 90 capsule 2   Glucagon  (GVOKE HYPOPEN  2-PACK) 0.5 MG/0.1ML SOAJ Inject 1 Dose into the skin daily as needed. 0.1 mL 2   losartan  (COZAAR ) 25 MG tablet Take 1 tablet by mouth once daily 90 tablet 1   nortriptyline  (PAMELOR ) 75 MG capsule Take 1 capsule by mouth at bedtime 90 capsule 0   omeprazole  (PRILOSEC ) 20 MG capsule Take 20 mg by mouth daily.     oxyCODONE -acetaminophen  (PERCOCET) 7.5-325 MG tablet Take 1 tablet by mouth 5 (five) times daily as needed. 150 tablet 0   sucralfate  (CARAFATE ) 1 g tablet Take 1 tablet (1 g total) by mouth 4 (four) times daily. 30 tablet 0   Insulin  Pen  Needle (PEN NEEDLES) 32G X 6 MM MISC Use as instructed to inject insulin  in the morning, at noon, in the evening, and at bedtime 400 each 3   naloxone (NARCAN) nasal spray 4 mg/0.1 mL  (Patient not taking: Reported on 07/17/2023)     No current facility-administered medications on file prior to visit.    Objective: BP 120/64   Pulse 74   Wt 244 lb 3.2 oz (110.8 kg)   LMP 02/09/2017   BMI 42.58 kg/m   Gen: wd, wn, nad 1+ bilat nonpitting edema Lungs clear Heart rrr, normal s1, s2 , no murmurs   Assessment: Encounter Diagnoses  Name Primary?   Type 2 diabetes mellitus with other circulatory complication, with long-term current use of insulin  (HCC) Yes   History of syncope    AVNRT (AV nodal re-entry tachycardia) (HCC)    Edema, unspecified type    CKD stage 3b, GFR 30-44 ml/min (HCC)    Dyslipidemia    Essential hypertension, benign    History  of iron  deficiency     Plan: Edema-continue Lasix  daily but add short-term low-dose spironolactone 12.5 mg daily and see if this helps.  Continue compression hose and walking for exercise.  Limit salt  Continue current medication regarding lipids and blood pressure  Continue iron  supplement regarding anemia and history of iron  deficiency  CKD 3B-continue current medications and get good water intake daily.  Avoid NSAIDs.  Diabetes-we discussed her recent labile sugar readings ranging from low readings all the way up to 500.  Counseled on diet, exercise and we will make some adjustments to her long-acting and fast acting insulins as below    Change the [name]  from 86 units daily, down to 83 units daily this week and watch for any low readings under 70.  Diet recommendations: Drink water throughout the day Limit or avoid sugary drinks including soda, juice, sweet tea, alcohol DO eat 2-3 fruit servings daily Do eat fresh or raw vegetables each meal DO eat lean cuts of chicken or fish or [place], 4-5 ounces or less However limit white potatoes Limit serving of bread, pasta and rice. Rice and past 1 cup or less. Bread should be medium to small roll or 45 cal whole wheat bread   Correction Insulin /Sliding Scale Your caregiver has decided you need insulin  at home. You have been given a correctional scale (sliding scale) in case you need extra insulin  when your blood sugar is too high (hyperglycemia). The following instructions will assist you in how to use that correctional scale.   WHAT IS A CORRECTIONAL SCALE (SLIDING SCALE)?      When you check your blood sugar, sometimes it will be higher than your caregiver wants it to be. You may need an extra dose of insulin  to bring your blood sugar to your desired level (also known as your goal, target level, or normal level.) The correctional scale is prescribed by your caregiver based on your specific needs.     ______________________________________________________________________  INSULIN  SLIDING SCALE   Use the chart below to determine the amount of your Fiasp  meal time Insulin  that you will use to control your meal time blood sugar.  If your glucose before meal is less than 60, drink 4 oz of orange juice or if able, eat a piece of candy and do not use the meal time dose of insulin   If your glucose before meal is 60 -100, don't use the meal time insulin  for this meal If your glucose before  meal is 101-150, use  29  units of Insulin   If your glucose before meal is 151-200, use  32  units of Insulin   If your glucose before meal is 201-250, use  35  units of Insulin  If your glucose before meal is 251-300, use  38  units of Insulin  If your glucose before meal is 301-350, use  41  units of Insulin  If your glucose before meal is 351-400, use  44  units of Insulin  If your glucose before meal is 451-500, use  47  units of Insulin  If your glucose before meal is >500, use 50 units of Insulin  and call doctor immediately  ________________________________________________________________________     WHY IS IT IMPORTANT TO KEEP YOUR BLOOD SUGAR LEVELS AT YOUR DESIRED LEVEL?     It helps to prevent long-term complications of diabetes, such as eye disease, kidney failure, and other serious complications. WHAT TYPE OF INSULIN  WILL YOU USE?   To help bring down blood sugars that are too high, your caregiver has prescribed a short-acting or a rapid-acting insulin . An example of a short-acting insulin  would be Regular.  WHAT DO I NEED TO DO?   Check your blood sugar with your home blood glucose meter as recommended by your caregiver. Using your correctional scale, find the range your blood sugar lies in.  Look for the units of insulin  that matches the blood sugar range. Give yourself the dose of correctional insulin  your caregiver has prescribed. Always make sure you are using the right type of insulin .   Prior to the injection make sure you have food available that you can eat in the next 15 to 30 minutes. If your correctional insulin  is rapid acting, start eating your meal within 15 minutes after you have given yourself the insulin  injection. If you wait longer than 15 minutes to eat, your blood sugar might get too low. If your correctional insulin  is short acting (Regular), start eating your meal within 30 minutes after you have given yourself the insulin  injection. If you wait longer than 30 minutes to eat, your blood sugar might get too low. Symptoms of low blood sugar (hypoglycemia) may include feeling shaky or weak, sweating a lot, not thinking straight, difficulty seeing, agitation, or crankiness. Check your blood sugar immediately and treat your results as directed by your caregiver.  Keep a log of your blood sugar results with the time you took the test and the amount of insulin  that you injected. This information will help your caregiver manage your medications. Note on your log anything that may affect your blood sugars such as: Changes in normal exercise or activity. Changes in your normal schedule, such as staying up late, going on vacation, changing your diet, or holidays. New medications. This includes all medications. Some medications, even those that do not require a prescription, may cause high blood sugars. Illness or stress. Changes in when you actually took your medication. Changes in your meals, such as skipping a meal, a late meal, or dining out. Eating things that may affect blood glucose, such as snacks, larger meal portions than normal, or drinks with sugar.  Ask your caregiver any questions you have.  [name] was seen today for medical management of chronic issues.  Diagnoses and all orders for this visit:  Type 2 diabetes mellitus with other circulatory complication, with long-term current use of insulin  (HCC) -     fenofibrate  (TRICOR ) 145 MG tablet; Take 1 tablet  (145 mg total) by mouth daily.  History of syncope -     furosemide  (LASIX ) 80 MG tablet; Take 1 tablet (80 mg total) by mouth daily.  AVNRT (AV nodal re-entry tachycardia) (HCC) -     furosemide  (LASIX ) 80 MG tablet; Take 1 tablet (80 mg total) by mouth daily.  Edema, unspecified type  CKD stage 3b, GFR 30-44 ml/min (HCC)  Dyslipidemia  Essential hypertension, benign  History of iron  deficiency  Other orders -     spironolactone (ALDACTONE) 25 MG tablet; Take 0.5 tablets (12.5 mg total) by mouth daily. -     DULoxetine  (CYMBALTA ) 60 MG capsule; Take 1 capsule (60 mg total) by mouth 2 (two) times daily. -     metoprolol  succinate (TOPROL -XL) 25 MG 24 hr tablet; Take 1 tablet (25 mg total) by mouth daily. -     rosuvastatin  (CRESTOR ) 20 MG tablet; Take 1 tablet (20 mg total) by mouth daily. -     potassium chloride  (KLOR-CON ) 10 MEQ tablet; Take 1 tablet (10 mEq total) by mouth 2 (two) times daily. -     budesonide -formoterol  (SYMBICORT ) 160-4.5 MCG/ACT inhaler; Inhale 2 puffs into the lungs 2 (two) times daily. -     insulin  glargine, 2 Unit Dial, ([name]  MAX SOLOSTAR) 300 UNIT/ML Solostar Pen; Inject 83 Units into the skin daily in the afternoon. INJECT 86 UNITS INTO THE SKIN DAILY AT 6AM -     insulin  aspart (FIASP  FLEXTOUCH) 100 UNIT/ML FlexTouch Pen; Inject 40 Units into the skin in the morning, at noon, and at bedtime. Max daily 120 units -     ferrous gluconate  (FERGON) 324 MG tablet; Take 1 tablet (324 mg total) by mouth daily with breakfast. -     albuterol  (VENTOLIN  HFA) 108 (90 Base) MCG/ACT inhaler; Inhale 2 puffs into the lungs every 6 (six) hours as needed for wheezing or shortness of breath. -     pantoprazole  (PROTONIX ) 40 MG tablet; Take 1 tablet (40 mg total) by mouth daily.     F/u with call report in 2-3 weeks

## 2023-08-06 NOTE — Patient Instructions (Addendum)
 Recommendations:  Change the [name]  from 86 units daily, down to 83 units daily this week and watch for any low readings under 70.  Diet recommendations: Drink water throughout the day Limit or avoid sugary drinks including soda, juice, sweet tea, alcohol DO eat 2-3 fruit servings daily Do eat fresh or raw vegetables each meal DO eat lean cuts of chicken or fish or [place], 4-5 ounces or less However limit white potatoes Limit serving of bread, pasta and rice. Rice and past 1 cup or less. Bread should be medium to small roll or 45 cal whole wheat bread   Correction Insulin /Sliding Scale Your caregiver has decided you need insulin  at home. You have been given a correctional scale (sliding scale) in case you need extra insulin  when your blood sugar is too high (hyperglycemia). The following instructions will assist you in how to use that correctional scale.   WHAT IS A CORRECTIONAL SCALE (SLIDING SCALE)?      When you check your blood sugar, sometimes it will be higher than your caregiver wants it to be. You may need an extra dose of insulin  to bring your blood sugar to your desired level (also known as your goal, target level, or normal level.) The correctional scale is prescribed by your caregiver based on your specific needs.    ______________________________________________________________________  INSULIN  SLIDING SCALE   Use the chart below to determine the amount of your Fiasp  meal time Insulin  that you will use to control your meal time blood sugar.  If your glucose before meal is less than 60, drink 4 oz of orange juice or if able, eat a piece of candy and do not use the meal time dose of insulin   If your glucose before meal is 60 -100, don't use the meal time insulin  for this meal If your glucose before meal is 101-150, use  29  units of Insulin   If your glucose before meal is 151-200, use  32  units of Insulin   If your glucose before meal is 201-250, use  35  units of Insulin  If  your glucose before meal is 251-300, use  38  units of Insulin  If your glucose before meal is 301-350, use  41  units of Insulin  If your glucose before meal is 351-400, use  44  units of Insulin  If your glucose before meal is 451-500, use  47  units of Insulin  If your glucose before meal is >500, use 50 units of Insulin  and call doctor immediately  ________________________________________________________________________     WHY IS IT IMPORTANT TO KEEP YOUR BLOOD SUGAR LEVELS AT YOUR DESIRED LEVEL?     It helps to prevent long-term complications of diabetes, such as eye disease, kidney failure, and other serious complications. WHAT TYPE OF INSULIN  WILL YOU USE?   To help bring down blood sugars that are too high, your caregiver has prescribed a short-acting or a rapid-acting insulin . An example of a short-acting insulin  would be Regular.  WHAT DO I NEED TO DO?   Check your blood sugar with your home blood glucose meter as recommended by your caregiver. Using your correctional scale, find the range your blood sugar lies in.  Look for the units of insulin  that matches the blood sugar range. Give yourself the dose of correctional insulin  your caregiver has prescribed. Always make sure you are using the right type of insulin .  Prior to the injection make sure you have food available that you can eat in the next 15 to 30  minutes. If your correctional insulin  is rapid acting, start eating your meal within 15 minutes after you have given yourself the insulin  injection. If you wait longer than 15 minutes to eat, your blood sugar might get too low. If your correctional insulin  is short acting (Regular), start eating your meal within 30 minutes after you have given yourself the insulin  injection. If you wait longer than 30 minutes to eat, your blood sugar might get too low. Symptoms of low blood sugar (hypoglycemia) may include feeling shaky or weak, sweating a lot, not thinking straight, difficulty seeing,  agitation, or crankiness. Check your blood sugar immediately and treat your results as directed by your caregiver.  Keep a log of your blood sugar results with the time you took the test and the amount of insulin  that you injected. This information will help your caregiver manage your medications. Note on your log anything that may affect your blood sugars such as: Changes in normal exercise or activity. Changes in your normal schedule, such as staying up late, going on vacation, changing your diet, or holidays. New medications. This includes all medications. Some medications, even those that do not require a prescription, may cause high blood sugars. Illness or stress. Changes in when you actually took your medication. Changes in your meals, such as skipping a meal, a late meal, or dining out. Eating things that may affect blood glucose, such as snacks, larger meal portions than normal, or drinks with sugar.  Ask your caregiver any questions you have.

## 2023-08-07 ENCOUNTER — Encounter: Payer: Self-pay | Admitting: Physical Medicine & Rehabilitation

## 2023-08-07 ENCOUNTER — Encounter (HOSPITAL_BASED_OUTPATIENT_CLINIC_OR_DEPARTMENT_OTHER): Admitting: Physical Medicine & Rehabilitation

## 2023-08-07 VITALS — BP 121/73 | HR 80 | Ht 63.5 in | Wt 247.0 lb

## 2023-08-07 DIAGNOSIS — K5901 Slow transit constipation: Secondary | ICD-10-CM | POA: Insufficient documentation

## 2023-08-07 DIAGNOSIS — M47816 Spondylosis without myelopathy or radiculopathy, lumbar region: Secondary | ICD-10-CM | POA: Insufficient documentation

## 2023-08-07 DIAGNOSIS — M5416 Radiculopathy, lumbar region: Secondary | ICD-10-CM | POA: Diagnosis not present

## 2023-08-07 DIAGNOSIS — G894 Chronic pain syndrome: Secondary | ICD-10-CM | POA: Diagnosis not present

## 2023-08-07 DIAGNOSIS — M629 Disorder of muscle, unspecified: Secondary | ICD-10-CM | POA: Diagnosis not present

## 2023-08-07 MED ORDER — METHOCARBAMOL 500 MG PO TABS: 500.0000 mg | ORAL_TABLET | Freq: Three times a day (TID) | ORAL | 2 refills | Status: DC | PRN

## 2023-08-07 NOTE — Patient Instructions (Signed)
 ALWAYS FEEL FREE TO CALL OUR OFFICE WITH ANY PROBLEMS OR QUESTIONS [phone])  **PLEASE NOTE** ALL MEDICATION REFILL REQUESTS (INCLUDING CONTROLLED SUBSTANCES) NEED TO BE MADE AT LEAST 7 DAYS PRIOR TO REFILL BEING DUE. ANY REFILL REQUESTS INSIDE THAT TIME FRAME MAY RESULT IN DELAYS IN RECEIVING YOUR PRESCRIPTION.

## 2023-08-07 NOTE — Progress Notes (Signed)
 Subjective:    Patient ID: [name], female    DOB: [birth year], 63 y.o.   MRN: [id number]  HPI  [name] is here in follow-up of her chronic pain.  I last saw her in February and we injected both greater trochanteric areas. She doesn't feel that the injections helped at all.  She had an x-ray of her lumbar spine ordered in January which revealed the following:   1. Moderate DDD of L4-L5. 2. Mild scoliotic curvature of the thoracolumbar spine with dominant caudal component convex to the left measuring approximately 4 degrees.  I reviewed the image personally with the patient today as well.  Pain is generally in her low back and buttocks areas left more than right with radiation down the hip and into the posterior thigh.  It feels more of an achy quality.  Patient denies pins-and-needles sensations.  She was placed on gabapentin  by her nurse practitioner which did not give her any relief.  For pain she has been using Percocet 7.5/325 1 tablet 5 times daily as needed.  She is on no muscle relaxants currently.  She is also on Cymbalta  60 mg per her primary she takes nortriptyline  75 mg at bedtime to help with sleep and additionally for pain.  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  She is on her a full-time schedule.  She has another week or so until she is finished.  She is on her feet throughout the day.  She works a full 40-hour a week.  Within an 8-hour days she has 30 minutes of break time at lunch.  I believe we had talked about in the past having a stool available occasionally to take a rest on for a few minutes here and there but it appears that she does not have that available.  Her weight remains an issue.  She states that her primary had talked her about this yesterday as well.   Pain Inventory Average Pain 8 Pain Right Now 9 My pain is sharp, dull, stabbing, tingling, and aching  In the last 24 hours, has pain interfered with the  following? General activity 0 Relation with others 1 Enjoyment of life 0 What TIME of day is your pain at its worst? morning , daytime, evening, and night Sleep (in general) Poor  Pain is worse with: walking, bending, standing, and some activites Pain improves with: rest and medication Relief from Meds: 7  Family History  Problem Relation Age of Onset   Hypertension Mother    Alzheimer's disease Mother    Diabetes Father    Breast cancer Sister 20   Anesthesia problems Neg Hx    Hypotension Neg Hx    Malignant hyperthermia Neg Hx    Pseudochol deficiency Neg Hx    Colon cancer Neg Hx    Esophageal cancer Neg Hx    Stomach cancer Neg Hx    Rectal cancer Neg Hx    Thyroid  disease Neg Hx    Social History   Socioeconomic History   Marital status: Divorced    Spouse name: Not on file   Number of children: 0   Years of education: Not on file   Highest education level: Not on file  Occupational History   Occupation: TEACHER ASSISTANT  Tobacco Use   Smoking status: Never   Smokeless tobacco: Never  Vaping Use   Vaping status: Never Used  Substance and Sexual Activity   Alcohol use: No   Drug use:  No   Sexual activity: Yes  Other Topics Concern   Not on file  Social History Narrative   Not on file   Social Drivers of Health   Financial Resource Strain: Not on file  Food Insecurity: Food Insecurity Present (09/03/2019)   Hunger Vital Sign    Worried About Running Out of Food in the Last Year: Sometimes true    Ran Out of Food in the Last Year: Sometimes true  Transportation Needs: No Transportation Needs (09/03/2019)   PRAPARE - Administrator, Civil Service (Medical): No    Lack of Transportation (Non-Medical): No  Physical Activity: Not on file  Stress: Not on file  Social Connections: Not on file   Past Surgical History:  Procedure Laterality Date   ANAL EXAMINATION UNDER ANESTHESIA  02/21/2011   anal fistula   BREAST EXCISIONAL BIOPSY Left     BREAST SURGERY  patient does not remember date of procedure   pull fluid off lft br   CHOLECYSTECTOMY N/A 09/12/2018   Procedure: LAPAROSCOPIC CHOLECYSTECTOMY;  Surgeon: Lockie Rima, MD;  Location: [facility] OR;  Service: General;  Laterality: N/A;   COLONOSCOPY  10/08/2016   2 day prep 1 TA,   ELECTROPHYSIOLOGIC STUDY N/A 05/05/2015   Procedure: SVT Ablation;  Surgeon: Will Cortland Ding, MD;  Location: [facility];  Service: Cardiovascular;  Laterality: N/A;   EP IMPLANTABLE DEVICE N/A 01/30/2016   Procedure: Loop Recorder Insertion;  Surgeon: Luana Rumple, MD;  Location: [facility];  Service: Cardiovascular;  Laterality: N/A;   INCISE AND DRAIN ABCESS     abscess on right thigh and buttock   KNEE ARTHROSCOPY     left   LAPAROSCOPIC APPENDECTOMY N/A 09/19/2018   Procedure: APPENDECTOMY LAPAROSCOPIC;  Surgeon: Adalberto Acton, MD;  Location: [facility];  Service: General;  Laterality: N/A;   SHOULDER SURGERY  04/14/2009   right   Past Surgical History:  Procedure Laterality Date   ANAL EXAMINATION UNDER ANESTHESIA  02/21/2011   anal fistula   BREAST EXCISIONAL BIOPSY Left    BREAST SURGERY  patient does not remember date of procedure   pull fluid off lft br   CHOLECYSTECTOMY N/A 09/12/2018   Procedure: LAPAROSCOPIC CHOLECYSTECTOMY;  Surgeon: Lockie Rima, MD;  Location: [facility];  Service: General;  Laterality: N/A;   COLONOSCOPY  10/08/2016   2 day prep 1 TA,   ELECTROPHYSIOLOGIC STUDY N/A 05/05/2015   Procedure: SVT Ablation;  Surgeon: Will Cortland Ding, MD;  Location: [facility];  Service: Cardiovascular;  Laterality: N/A;   EP IMPLANTABLE DEVICE N/A 01/30/2016   Procedure: Loop Recorder Insertion;  Surgeon: Luana Rumple, MD;  Location: [facility];  Service: Cardiovascular;  Laterality: N/A;   INCISE AND DRAIN ABCESS     abscess on right thigh and buttock   KNEE ARTHROSCOPY     left   LAPAROSCOPIC APPENDECTOMY N/A 09/19/2018   Procedure:  APPENDECTOMY LAPAROSCOPIC;  Surgeon: Adalberto Acton, MD;  Location: [facility];  Service: General;  Laterality: N/A;   SHOULDER SURGERY  04/14/2009   right   Past Medical History:  Diagnosis Date   Abscess    increased drainage from abscess on buttock   Anal fistula    Anxiety    Bilateral hip pain 05/27/2015   Chronic pain syndrome 05/27/2015   Depression    sees Dr. Delice Felt   Diabetes mellitus without complication (HCC)    Diverticulosis    Elevated alkaline phosphatase  level    Fatty liver    Gallstones    Hiatal hernia    Hyperlipidemia    Hypertension    Internal hemorrhoids    Sleep apnea    2008- sleep study, neg. for sleep apnea    Stroke [facility])    SVT (supraventricular tachycardia) (HCC)    Symptomatic cholelithiasis 09/11/2018   BP 121/73 (BP Location: Left Arm, Patient Position: Sitting, Cuff Size: Large)   Pulse 80   Ht 5' 3.5" (1.613 m)   Wt 247 lb (112 kg)   LMP 02/09/2017   SpO2 97%   BMI 43.07 kg/m   Opioid Risk Score:   Fall Risk Score:  `1  Depression screen [facility] 2/9     08/07/2023    3:27 PM 07/16/2023    3:31 PM 05/08/2023    3:26 PM 03/21/2023    3:07 PM 01/16/2023    2:50 PM 11/15/2022    3:15 PM 09/17/2022    1:41 PM  Depression screen PHQ 2/9  Decreased Interest 0 0 0 0 0 0 0  Down, Depressed, Hopeless 0 0 0 0 0 0 0  PHQ - 2 Score 0 0 0 0 0 0 0     Review of Systems  Musculoskeletal:  Positive for arthralgias, back pain, joint swelling and myalgias.       Bilateral hip pain radiating into both legs, lower back pain  All other systems reviewed and are negative.      Objective:   Physical Exam Gen: no distress, normal appearing, morbidly obese HEENT: oral mucosa pink and moist, NCAT Cardio: Reg rate Chest: normal effort, normal rate of breathing Abd: soft, non-distended Ext: no edema Psych: pleasant, normal affect Skin: intact Neuro: Alert and oriented x 3. Normal insight and awareness. Intact Memory. Normal language and speech. Cranial  nerve exam unremarkable. MMT: She is 5 out of 5 in both upper extremities.  Lower extremities are grossly 5 out of 5 as well with normal sensation.  Reflexes are 1+.  Gait is wide-based..   Musculoskeletal: She has tenderness over the lumbar paraspinals from about L3 downward.  There is pain at both PSIS areas and she is tender additionally in the bilateral gluteal areas and in the hamstrings.  With range of motion testing she is limited with flexion and extension.  Facet maneuvers seem to cause the most pain today.  She is tight with any types of range of motion forward in both of her hamstrings and glutes.  Seated slump test only triggered pain in a pool in her hamstrings bilaterally left more than right.       Assessment & Plan:  1. 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  1 tablet 5 times daily as needed.  Recently filled by nurse practitioner -We will continue the controlled substance monitoring program, this consists of regular clinic visits, examinations, routine drug screening, pill counts as well as use of [place]  Controlled Substance Reporting System. [place].   Medication was refilled and a second prescription was sent to the patient's pharmacy for next month.  .              -Continue extra Tylenol  within recommended limitations -Reviewed hamstring stretches again today. Exercises were provided -send for therapy at [facility] to address posture and stretching.  Provided patient hamstring  stretches today         -won't pursue greater trochanter injections in future         -Prescribed Robaxin  500 mg every 8 hours as needed #60 for muscle spasm         - We discussed the importance of weight loss as she has remained morbidly obese and this certainly is impacting her lower half         -Consider follow-up MRI of the lumbar spine as last was done in 2016 2.  Slow transit constipation                 -senokot s for constipation.         3. Morbid obesity             diet has been reviewed             -S we discussed this above 4. Sleep: Improved with Pamelor  75 mg nightly.    Twenty minutes of face to face patient care time were spent during this visit. All questions were encouraged and answered.  Follow up with me in 2 months.                               Aaron Aas

## 2023-08-08 ENCOUNTER — Telehealth: Payer: Self-pay | Admitting: Internal Medicine

## 2023-08-08 ENCOUNTER — Other Ambulatory Visit: Payer: Self-pay | Admitting: Medical

## 2023-08-08 MED ORDER — TOUJEO MAX SOLOSTAR 300 UNIT/ML ~~LOC~~ SOPN: 83.0000 [IU] | PEN_INJECTOR | Freq: Every day | SUBCUTANEOUS | 2 refills | Status: DC

## 2023-08-08 NOTE — Telephone Encounter (Addendum)
 Please advise. Pen will only go in increments by 2 not old numbers.  Copied from CRM [id number]. Topic: Clinical - Medication Question >> Aug 08, 2023  3:05 PM Marissa P wrote: Reason for CRM: Adriana Hopping from [facility] pharmacy on [address] called in regards to Communication Reason for CRM: Prescription for insulin  glargine, 2 Unit Dial, ([name]  MAX SOLOSTAR) 300 UNIT/ML Solostar Pen.She stated that someone did contact her back in regards sent in directions for 83 units but the pen cannot do that, the pen only does it by increments of 2. Please advise callback# [phone].

## 2023-08-08 NOTE — Telephone Encounter (Signed)
 Please advise how many units she is suppose to be using for the [name] . There are 2 different units listed  Copied from CRM [id number]. Topic: Clinical - Prescription Issue >> Aug 08, 2023  8:49 AM [name] wrote: Reason for CRM: Prescription for insulin  glargine, 2 Unit Dial, ([name]  MAX SOLOSTAR) 300 UNIT/ML Solostar Pen [[id number]] has 2 sets of instructions//Please correct and resend

## 2023-08-09 ENCOUNTER — Telehealth: Payer: Self-pay

## 2023-08-09 DIAGNOSIS — E1165 Type 2 diabetes mellitus with hyperglycemia: Secondary | ICD-10-CM

## 2023-08-09 MED ORDER — OMNIPOD 5 G7 PODS (GEN 5) MISC: 1.0000 | 3 refills | Status: DC

## 2023-08-09 MED ORDER — OMNIPOD 5 G7 INTRO (GEN 5) KIT: 1.0000 | PACK | 0 refills | Status: DC

## 2023-08-09 MED ORDER — TOUJEO MAX SOLOSTAR 300 UNIT/ML ~~LOC~~ SOPN: 82.0000 [IU] | PEN_INJECTOR | Freq: Every day | SUBCUTANEOUS | 2 refills | Status: DC

## 2023-08-09 NOTE — Telephone Encounter (Signed)
 Resent rx in with new units of 82

## 2023-08-09 NOTE — Telephone Encounter (Signed)
 Patient has request to be put on Omnipod per [facility] pharmacy

## 2023-08-09 NOTE — Addendum Note (Signed)
 Addended by: Charliene Conte A on: 08/09/2023 12:33 PM   Modules accepted: Orders

## 2023-08-21 ENCOUNTER — Other Ambulatory Visit: Payer: Self-pay | Admitting: Medical

## 2023-08-27 ENCOUNTER — Encounter (HOSPITAL_COMMUNITY): Payer: Self-pay

## 2023-08-27 ENCOUNTER — Ambulatory Visit (HOSPITAL_COMMUNITY)
Admission: RE | Admit: 2023-08-27 | Discharge: 2023-08-27 | Disposition: A | Discharge status: Home / Self Care | Source: Ambulatory Visit | Attending: Internal Medicine | Admitting: Internal Medicine

## 2023-08-27 DIAGNOSIS — R933 Abnormal findings on diagnostic imaging of other parts of digestive tract: Secondary | ICD-10-CM | POA: Diagnosis present

## 2023-08-27 LAB — POCT I-STAT CREATININE: Creatinine, Ser: 1.7 mg/dL — ABNORMAL HIGH (ref 0.44–1.00)

## 2023-08-27 MED ORDER — IOHEXOL 300 MG/ML  SOLN
75.0000 mL | Freq: Once | INTRAMUSCULAR | Status: AC | PRN
  Administered 2023-08-27: 75 mL via INTRAVENOUS

## 2023-08-27 MED ORDER — SODIUM CHLORIDE (PF) 0.9 % IJ SOLN
INTRAMUSCULAR | Status: AC
  Filled 2023-08-27: qty 50

## 2023-09-04 ENCOUNTER — Encounter: Payer: Self-pay | Admitting: Internal Medicine

## 2023-09-04 ENCOUNTER — Ambulatory Visit (INDEPENDENT_AMBULATORY_CARE_PROVIDER_SITE_OTHER): Admitting: Internal Medicine

## 2023-09-04 VITALS — BP 120/80 | HR 82 | Ht 63.5 in | Wt 235.0 lb

## 2023-09-04 DIAGNOSIS — E1142 Type 2 diabetes mellitus with diabetic polyneuropathy: Secondary | ICD-10-CM

## 2023-09-04 DIAGNOSIS — E1165 Type 2 diabetes mellitus with hyperglycemia: Secondary | ICD-10-CM

## 2023-09-04 DIAGNOSIS — E1159 Type 2 diabetes mellitus with other circulatory complications: Secondary | ICD-10-CM

## 2023-09-04 DIAGNOSIS — N1832 Chronic kidney disease, stage 3b: Secondary | ICD-10-CM

## 2023-09-04 DIAGNOSIS — E0590 Thyrotoxicosis, unspecified without thyrotoxic crisis or storm: Secondary | ICD-10-CM

## 2023-09-04 DIAGNOSIS — E785 Hyperlipidemia, unspecified: Secondary | ICD-10-CM

## 2023-09-04 DIAGNOSIS — Z794 Long term (current) use of insulin: Secondary | ICD-10-CM

## 2023-09-04 DIAGNOSIS — E1122 Type 2 diabetes mellitus with diabetic chronic kidney disease: Secondary | ICD-10-CM

## 2023-09-04 LAB — POCT GLYCOSYLATED HEMOGLOBIN (HGB A1C): Hemoglobin A1C: 9.8 % — AB (ref 4.0–5.6)

## 2023-09-04 LAB — POCT GLUCOSE (DEVICE FOR HOME USE): POC Glucose: 268 mg/dL — AB (ref 70–99)

## 2023-09-04 MED ORDER — FENOFIBRATE 145 MG PO TABS: 145.0000 mg | ORAL_TABLET | Freq: Every day | ORAL | 3 refills | Status: DC

## 2023-09-04 MED ORDER — ROSUVASTATIN CALCIUM 20 MG PO TABS: 20.0000 mg | ORAL_TABLET | Freq: Every day | ORAL | 3 refills | Status: DC

## 2023-09-04 MED ORDER — FIASP FLEXTOUCH 100 UNIT/ML ~~LOC~~ SOPN: 40.0000 [IU] | PEN_INJECTOR | Freq: Three times a day (TID) | SUBCUTANEOUS | Status: DC

## 2023-09-04 NOTE — Progress Notes (Signed)
 Name: [name]  Age/ Sex: 63 y.o., female   MRN/ DOB: [id number], 07-24-1960     PCP: Bulah Alm RAMAN, PA-C   Reason for Endocrinology Evaluation: Type 2 Diabetes Mellitus  Initial Endocrine Consultative Visit: 07/06/2020    PATIENT IDENTIFIER: [name] is a 63 y.o. female with a past medical history of SVT, MNG, T2DM and Dyslipidemia . The patient has followed with Endocrinology clinic since 07/06/2020 for consultative assistance with management of her diabetes.  DIABETIC HISTORY:  [name] was diagnosed with DM in 2011 ,Ozempic - severe nausea . Insulin  started in 2015. Metformin - no intolerance , jardiance - no intolerance, soliqua . Her hemoglobin A1c has ranged from 6.5%  in 2016, peaking at 13.6% in 2021  THYROID  HISTROY : She was found to have hypermetabolic  MNG on PET scan in 2016 during evaluation of ovarian cyst. These results were confirmed with thyroid  ultrasound in 2017. She is S/P FNA of the isthmic and right lobe nodule with scant cellularity in 2017. She was lost to follow up until 06/2020, repeat ultrasound showed heterogenous gland but no specific nodules  Reduced Metformin  due to low GFR 01/2021  Aldo: renin normal 09/2020  Started Jardiance  09/2021 but this was discontinued by 07/2022 due to recurrent yeast infections  Metformin  discontinued 07/2022 due to a GFR 31  Mounjaro   2.5 mg caused GI side effects 09/2023   A prescription for OmniPod and referral to our CDE was placed 07/2023 due to patient's request   SUBJECTIVE:   During the last visit (06/04/2023): A1c 10.2%     Today (09/04/2023): [name] is here for a F/U on diabetes and MNG.  She has not been checking glucose through freestyle libre , she has not been checking glucose at    Denies recent nausea or vomiting  Denies Constipation  or diarrhea  Denies palpitation  Denies local neck swelling    HOME DIABETES REGIMEN:  [name]  82 units once daily Fiasp  36 units  TIDQAC CF: Fiasp  ( BG-130/20) TIDQAC Losartan  25 mg daily  Fenofibrate  145 mg daily  Rosuvastatin  20 mg daily      Statin: yes ACE-I/ARB: no Prior Diabetic Education: yes    CONTINUOUS GLUCOSE MONITORING RECORD INTERPRETATION    Dates of Recording: 4/15-4/28/2025  Sensor description: [employer] 3  Results statistics:   CGM use % of time 99  Average and SD 206/44  Time in range 37%  % Time Above 180 25  % Time above 250 33  % Time Below target 5   Glycemic patterns summary: BG's trend down overnight and fluctuate during the day  Hyperglycemic episodes postprandial  Hypoglycemic episodes occurred at variable times   Overnight periods: Trends down    DIABETIC COMPLICATIONS: Microvascular complications:  Neuropathy Denies: CKD, retinopathy Last Eye Exam: Completed 08/2021  Macrovascular complications:  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 Alvera   Diabetes mellitus without complication (HCC)    Diverticulosis    Elevated alkaline phosphatase level    Fatty liver    Gallstones    Hiatal hernia    Hyperlipidemia    Hypertension    Internal hemorrhoids    Sleep apnea  2008- sleep study, neg. for sleep apnea    Stroke [facility])    SVT (supraventricular tachycardia) (HCC)    Symptomatic cholelithiasis 09/11/2018   Past Surgical History:  Past Surgical History:  Procedure Laterality Date   ANAL EXAMINATION UNDER ANESTHESIA  02/21/2011   anal fistula   BREAST EXCISIONAL BIOPSY Left    BREAST SURGERY  patient does not remember date of procedure   pull fluid off lft br   CHOLECYSTECTOMY N/A 09/12/2018   Procedure: LAPAROSCOPIC CHOLECYSTECTOMY;  Surgeon: Aron Shoulders, MD;  [facility];   Service: General;  Laterality: N/A;   COLONOSCOPY  10/08/2016   2 day prep 1 TA,   ELECTROPHYSIOLOGIC STUDY N/A 05/05/2015   Procedure: SVT Ablation;  Surgeon: Will Gladis Norton, MD;  [facility];  Service: Cardiovascular;  Laterality: N/A;   EP IMPLANTABLE DEVICE N/A 01/30/2016   Procedure: Loop Recorder Insertion;  Surgeon: Jerel Balding, MD;  [facility];  Service: Cardiovascular;  Laterality: N/A;   INCISE AND DRAIN ABCESS     abscess on right thigh and buttock   KNEE ARTHROSCOPY     left   LAPAROSCOPIC APPENDECTOMY N/A 09/19/2018   Procedure: APPENDECTOMY LAPAROSCOPIC;  Surgeon: Signe Mitzie LABOR, MD;  Location: [facility] OR;  Service: General;  Laterality: N/A;   SHOULDER SURGERY  04/14/2009   right   Social History:  reports that she has never smoked. She has never used smokeless tobacco. She reports that she does not drink alcohol and does not use drugs. Family History:  Family History  Problem Relation Age of Onset   Hypertension Mother    Alzheimer's disease Mother    Diabetes Father    Breast cancer Sister 10   Anesthesia problems Neg Hx    Hypotension Neg Hx    Malignant hyperthermia Neg Hx    Pseudochol deficiency Neg Hx    Colon cancer Neg Hx    Esophageal cancer Neg Hx    Stomach cancer Neg Hx    Rectal cancer Neg Hx    Thyroid  disease Neg Hx      HOME MEDICATIONS: Allergies as of 09/04/2023       Reactions   Jardiance  [empagliflozin ] Other (See Comments)   Recurrent yeast infections    Mounjaro  [tirzepatide ] Other (See Comments)   Stomach cramps per patient    Ozempic  (0.25 Or 0.5 Mg-dose) [semaglutide (0.25 Or 0.5mg -dos)]    Severe nausea, bad gas   Tramadol  Nausea Only        Medication List        Accurate as of September 04, 2023  2:00 PM. If you have any questions, ask your nurse or doctor.          albuterol  108 (90 Base) MCG/ACT inhaler Commonly known as: VENTOLIN  HFA Inhale 2 puffs into the lungs every 6  (six) hours as needed for wheezing or shortness of breath.   aspirin  EC 81 MG tablet Take 1 tablet (81 mg total) by mouth daily.   budesonide -formoterol  160-4.5 MCG/ACT inhaler Commonly known as: Symbicort  Inhale 2 puffs into the lungs 2 (two) times daily.   calcium  carbonate 600 MG Tabs tablet Commonly known as: Calcium  600 Take 1 tablet (600 mg total) by mouth 2 (two) times daily with a meal.   Dexcom G7 Sensor Misc 1 Device by Does not apply route as directed.   dicyclomine  10 MG capsule Commonly known as: BENTYL  Take 2 capsules (20 mg total) by mouth 3 (three) times daily  as needed for spasms.   DULoxetine  60 MG capsule Commonly known as: CYMBALTA  Take 1 capsule (60 mg total) by mouth 2 (two) times daily.   fenofibrate  145 MG tablet Commonly known as: TRICOR  Take 1 tablet (145 mg total) by mouth daily.   ferrous gluconate  324 MG tablet Commonly known as: FERGON Take 1 tablet (324 mg total) by mouth daily with breakfast.   Fiasp  FlexTouch 100 UNIT/ML FlexTouch Pen Generic drug: insulin  aspart Inject 40 Units into the skin in the morning, at noon, and at bedtime. Max daily 120 units   furosemide  80 MG tablet Commonly known as: LASIX  Take 1 tablet (80 mg total) by mouth daily.   Gvoke HypoPen  2-Pack 0.5 MG/0.1ML Soaj Generic drug: Glucagon  Inject 1 Dose into the skin daily as needed.   losartan  25 MG tablet Commonly known as: COZAAR  Take 1 tablet by mouth once daily   methocarbamol  500 MG tablet Commonly known as: ROBAXIN  Take 1 tablet (500 mg total) by mouth every 8 (eight) hours as needed for muscle spasms.   metoprolol  succinate 25 MG 24 hr tablet Commonly known as: TOPROL -XL Take 1 tablet (25 mg total) by mouth daily.   naloxone 4 MG/0.1ML Liqd nasal spray kit Commonly known as: NARCAN   nortriptyline  75 MG capsule Commonly known as: PAMELOR  Take 1 capsule by mouth at bedtime   omeprazole  20 MG capsule Commonly known as: PRILOSEC  Take 1 capsule by  mouth once daily   Omnipod 5 G7 Pods (Gen 5) Misc 1 Device by Does not apply route every other day.   Omnipod 5 G7 Intro (Gen 5) Kit 1 Device by Does not apply route every other day.   oxyCODONE -acetaminophen  7.5-325 MG tablet Commonly known as: Percocet Take 1 tablet by mouth 5 (five) times daily as needed.   pantoprazole  40 MG tablet Commonly known as: PROTONIX  Take 1 tablet (40 mg total) by mouth daily.   Pen Needles 32G X 6 MM Misc Use as instructed to inject insulin  in the morning, at noon, in the evening, and at bedtime   potassium chloride  10 MEQ tablet Commonly known as: KLOR-CON  Take 1 tablet (10 mEq total) by mouth 2 (two) times daily.   rosuvastatin  20 MG tablet Commonly known as: CRESTOR  Take 1 tablet (20 mg total) by mouth daily.   spironolactone  25 MG tablet Commonly known as: Aldactone  Take 0.5 tablets (12.5 mg total) by mouth daily.   sucralfate  1 g tablet Commonly known as: Carafate  Take 1 tablet (1 g total) by mouth 4 (four) times daily.   [name]  Max SoloStar 300 UNIT/ML Solostar Pen Generic drug: insulin  glargine (2 Unit Dial) Inject 82 Units into the skin daily in the afternoon.         OBJECTIVE:   Vital Signs: BP 120/80 (BP Location: Left Arm, Patient Position: Sitting, Cuff Size: Large)   Pulse 82   Ht 5' 3.5 (1.613 m)   Wt 235 lb (106.6 kg)   LMP 02/09/2017   SpO2 99%   BMI 40.98 kg/m   Wt Readings from Last 3 Encounters:  09/04/23 235 lb (106.6 kg)  08/07/23 247 lb (112 kg)  08/06/23 244 lb 3.2 oz (110.8 kg)     Exam: General: [name] appears well and is in NAD Bilateral thyroid  nodules were appreciated on today's exam  Lungs: Clear with good BS bilat  Heart: RRR   Extremities: Trace pretibial edema.   Neuro: MS is good with appropriate affect, [name] is alert and Ox3  DM foot exam: 06/04/2023   The skin of the feet is  without sores or ulcerations. The pedal pulses are 1+ on right and 1+ on left. The sensation is intact  to a screening 5.07, 10 gram monofilament bilaterally     DATA REVIEWED:  Lab Results  Component Value Date   HGBA1C 9.8 (A) 09/04/2023   HGBA1C 10.2 (A) 06/04/2023   HGBA1C 13.8 (H) 04/03/2023    Latest Reference Range & Units 07/17/23 14:03  Sodium 134 - 144 mmol/L 133 (L)  Potassium 3.5 - 5.2 mmol/L 4.4  Chloride 96 - 106 mmol/L 95 (L)  CO2 20 - 29 mmol/L 21  Glucose 70 - 99 mg/dL 473 (HH)  BUN 8 - 27 mg/dL 33 (H)  Creatinine 9.42 - 1.00 mg/dL 8.12 (H)  Calcium  8.7 - 10.3 mg/dL 89.8  BUN/Creatinine Ratio 12 - 28  18  eGFR >59 mL/min/1.73 30 (L)    Latest Reference Range & Units 07/17/23 14:03  TSH 0.450 - 4.500 uIU/mL 0.366 (L)  (L): Data is abnormally low    THYROID  ultrasound 09/21/2022  FINDINGS: Parenchymal Echotexture: Markedly heterogenous   Isthmus: 2.1 cm thickness, previously 3.1   Right lobe: 8 x 4.8 x 5 cm, previously 8.1 x 4.2 x 4.5   Left lobe: 7.2 x 5.5 cm, previously 7 x 3.7 x 3.9   _________________________________________________________   Estimated total number of nodules >/= 1 cm: 0   Number of spongiform nodules >/=  2 cm not described below (TR1): 0   Number of mixed cystic and solid nodules >/= 1.5 cm not described below (TR2): 0   _________________________________________________________   Heterogenous lobular parenchyma.   0.8 cm hypoechoic nodule without calcifications, superior left; This nodule does NOT meet TI-RADS criteria for biopsy or dedicated follow-up.   No other discrete lesions.  No regional cervical adenopathy.   IMPRESSION: Enlarged heterogenous thyroid  with no suspicious nodules. No recommendation for biopsy or follow-up.   The above is in keeping with the ACR TI-RADS recommendations [facility] J[facility]NY [place] Radiol 2017;14:587-595    FNA isthmic nodule 1.8 cm  03/31/2015 Scant epithelium     FNA right lower nodule 4.8 cm 03/31/2015 Scant epithelium    In office BG 263 Mg/DL  ASSESSMENT / PLAN / RECOMMENDATIONS:    1) Type 2 Diabetes Mellitus, poorly controlled, With Neuropathic, CKD III  and macrovascular complications - Most recent A1c of 9.8 %. Goal A1c <7.0%.     - A1c continues to be above goal -Intolerant to Jardiance  due to recurrent yeast infections -Intolerant to Ozempic  and Mounjaro  2.5 mg dose -We stopped  metformin  due to GFR <35 -Not a candidate for pioglitazone due to lower extremity edema -She was able to get the OmniPod, she is waiting on training -Patient continues to endorse hypoglycemia overnight, will decrease [name]  and increase Fiasp  -She has not been checking glucose through CGM, hence she has been missing on correction opportunities   MEDICATIONS:  Decrease [name]  80 units once daily Increase Fiasp  40 units TIDQAC Continue CF: Fiasp  ( BG-130/20) TIDQAC  EDUCATION / INSTRUCTIONS: BG monitoring instructions: Patient is instructed to check her blood sugars 3 times a day, before meals. Call [facility] if: BG persistently < 70  I reviewed the Rule of 15 for the treatment of hypoglycemia in detail with the patient. Literature supplied.   2) Diabetic complications:  Eye: Does not have known diabetic retinopathy.  Neuro/ Feet: Does  have known diabetic peripheral neuropathy .  Renal:  Patient does not have known baseline CKD. She   is  on an ACEI/ARB at present.    3) MNG:    -No local neck symptoms - She is S/P FNA of the isthmic and right nodule in 2017 with scant cellularity , she was lost to follow up until 07/2020 when a repeat Thyroid  ultrasound showed increase thyroid  heterogeneity but no actual thyroid  nodules, previously biopsied nodules have blended in with a heterogeneous background - Up-to-date on thyroid  ultrasound 09/2022   4) Subclinical Hyperthyroidism:  -Patient is asymptomatic -TSH level is low, but we have opted to hold off on any treatment unless her levels decreased to 0.1 u IU/mL or becomes symptomatic   5) CKD III:  -GFR has  been trending down -I started her on ARB in 2024  Medication Continue losartan  25 mg daily  6) Dyslipidemia :   - Lipid panel has been at goal in 2024 - Patient was not sure if she is on fenofibrate , I did emphasize the importance of Taking sure she is taking her medications as prescribed   Medication  Continue rosuvastatin  20 mg daily Continue fenofibrate     F/U in 3 months   Signed electronically by: Stefano Redgie Butts, MD  [facility] Endocrinology  [facility] [address] [place] [address] [place], [place] [address] Phone: [phone] FAX: [phone]   CC: Bulah Alm RAMAN, PA-C [address] [place] [address] Phone: [phone]  Fax: [phone]  Return to Endocrinology clinic as below: Future Appointments  Date Time Provider Department Center  10/03/2023  3:20 PM Debby Fidela CROME, NP [facility] CPR  10/07/2023  8:15 AM Bulah Alm RAMAN, PA-C [facility]  11/06/2023  3:40 PM Babs Arthea DASEN, MD [facility] CPR

## 2023-09-04 NOTE — Patient Instructions (Signed)
-   Decrease [name]  80 units once daily  - Increase  Fiasp  40 units with each meal    -Fiasp  correctional insulin : ADD extra units on insulin  to your meal-time Fiasp  dose if your blood sugars are higher than 150. Use the scale below to help guide you:   Blood sugar before meal Number of units to inject  Less than 150 0 unit  151 -  170 1 units  171 -  190 2 units  191 -  210 3 units  211 -  230 4 units  231 -  250 5 units  251 -  270 6 units  271 -  290 7 units  291 -  310 8 units  311 - 330 9 units  331 - 350 10 units  351 - 370 11 units   371 - 390 12 units  391 - 410 13 units         HOW TO TREAT LOW BLOOD SUGARS (Blood sugar LESS THAN 70 MG/DL) Please follow the RULE OF 15 for the treatment of hypoglycemia treatment (when your (blood sugars are less than 70 mg/dL)   STEP 1: Take 15 grams of carbohydrates when your blood sugar is low, which includes:  3-4 GLUCOSE TABS  OR 3-4 OZ OF JUICE OR REGULAR SODA OR ONE TUBE OF GLUCOSE GEL    STEP 2: RECHECK blood sugar in 15 MINUTES STEP 3: If your blood sugar is still low at the 15 minute recheck --> then, go back to STEP 1 and treat AGAIN with another 15 grams of carbohydrates.

## 2023-09-05 LAB — MICROALBUMIN / CREATININE URINE RATIO
Creatinine, Urine: 69 mg/dL (ref 20–275)
Microalb Creat Ratio: 20 mg/g{creat} (ref <30)
Microalb, Ur: 1.4 mg/dL

## 2023-09-09 ENCOUNTER — Ambulatory Visit: Payer: Self-pay | Admitting: Internal Medicine

## 2023-09-11 ENCOUNTER — Other Ambulatory Visit: Payer: Self-pay | Admitting: Internal Medicine

## 2023-09-11 ENCOUNTER — Ambulatory Visit: Admitting: Nutrition

## 2023-09-11 MED ORDER — FIASP 100 UNIT/ML IJ SOLN: INTRAMUSCULAR | 6 refills | Status: DC

## 2023-09-16 ENCOUNTER — Encounter: Attending: Internal Medicine | Admitting: Nutrition

## 2023-09-16 DIAGNOSIS — Z794 Long term (current) use of insulin: Secondary | ICD-10-CM | POA: Insufficient documentation

## 2023-09-16 DIAGNOSIS — E1165 Type 2 diabetes mellitus with hyperglycemia: Secondary | ICD-10-CM | POA: Insufficient documentation

## 2023-09-16 NOTE — Progress Notes (Unsigned)
 Patient was trained and started on her OmniPod5 insulin  pump.  Settings were put into the pump per DR. Shamleffer's orders:  Basal rate: 2.5u/hr, Max basal: 5.0u/hr.  target glucose: 120 with corrections over 120, I/C ratio: 1 with written instructions to give 15g for average meals and 7 grams for snacks.  ISF: 25, duration: 4 hours, and max bolus 30.   WE linked her Dexcom to her PDM, and to clarity and [facility] end[facility].  The Pod was linked to [employer] as well with user name:[email or web address],  password: [email or web address]!! She was shown how to bolus, and how and when to do a correction bolus.  Her blood sugar was 270 and she did a correction dose of 5.25u of insulin  at 11AM.  She filled a pod with Fiasp  insulin  and was show when to apply this with reference to her sensor.  She applied this to her left abdomen and started on this at 11AM.  She redemonstrated how to give a bolus and how to do a correction dose correctly.  She was given a pump packet and we reviewed high and low blood sugar protocols, sick day guidelines and supplies to carry with her at all times.  She was encouraged to read all information given to her as well as her pod starter booklet.   She had no final questions.  She did not want to schedule a return visit at this time.  I will call her tomorrow to see how she is doing.  Help numbers give for OmniPod and Dexcom.

## 2023-09-16 NOTE — Patient Instructions (Signed)
 Change pod when empty Give a bolus for all meals and snacks, and remember to include the blood sugar readings for all boluses and correction doses Call OmniPod help line if questions about her pump Call Dexcom help line if questions about her sensor

## 2023-09-17 ENCOUNTER — Telehealth: Payer: Self-pay | Admitting: Nutrition

## 2023-09-17 NOTE — Telephone Encounter (Signed)
 Patient reported that she had no difficulty giving boluses last night or this AM.  Says FBS today was 109. Reports that blood sugars were between 100-110 all night long.  She had no questions for me at this time

## 2023-09-18 MED ORDER — OXYCODONE-ACETAMINOPHEN 7.5-325 MG PO TABS: 1.0000 | ORAL_TABLET | Freq: Every day | ORAL | 0 refills | Status: DC | PRN

## 2023-09-18 NOTE — Telephone Encounter (Signed)
 Rx sent.

## 2023-09-20 ENCOUNTER — Telehealth: Payer: Self-pay | Admitting: Physical Medicine & Rehabilitation

## 2023-09-20 NOTE — Telephone Encounter (Signed)
 Pt calling regarding oxycodone  refill-- per note was sent in by Dr. Babs on 07/09 however the pharmacy states that they have not received anything. She usually sees [place] for medication management.

## 2023-09-20 NOTE — Telephone Encounter (Signed)
 Called Pharmacy to confirm refill is ready for Oxycodone .  RX is ready since 09/18/23.  Patient advised that the rx was sent over by Dr. Babs.  Patient made aware

## 2023-09-24 ENCOUNTER — Telehealth: Payer: Self-pay | Admitting: Nutrition

## 2023-09-24 NOTE — Telephone Encounter (Signed)
 Patient admits that she has been putting in 20g for meals and 10g for snacks because she was running high after meals.  She reports no lows and blood sugars.  Glooko shows no lows.

## 2023-09-28 ENCOUNTER — Other Ambulatory Visit: Payer: Self-pay | Admitting: Registered Nurse

## 2023-10-03 ENCOUNTER — Encounter: Admitting: Registered Nurse

## 2023-10-03 NOTE — Progress Notes (Deleted)
 Subjective:    Patient ID: [name], female    DOB: 10/11/1960, 63 y.o.   MRN: [id number]  HPI  Pain Inventory Average Pain {NUMBERS; 0-10:5044} Pain Right Now {NUMBERS; 0-10:5044} My pain is {PAIN DESCRIPTION:21022940}  In the last 24 hours, has pain interfered with the following? General activity {NUMBERS; 0-10:5044} Relation with others {NUMBERS; 0-10:5044} Enjoyment of life {NUMBERS; 0-10:5044} What TIME of day is your pain at its worst? {time of day:24191} Sleep (in general) {BHH GOOD/FAIR/POOR:22877}  Pain is worse with: {ACTIVITIES:21022942} Pain improves with: {PAIN IMPROVES TPUY:78977056} Relief from Meds: {NUMBERS; 0-10:5044}  Family History  Problem Relation Age of Onset   Hypertension Mother    Alzheimer's disease Mother    Diabetes Father    Breast cancer Sister 35   Anesthesia problems Neg Hx    Hypotension Neg Hx    Malignant hyperthermia Neg Hx    Pseudochol deficiency Neg Hx    Colon cancer Neg Hx    Esophageal cancer Neg Hx    Stomach cancer Neg Hx    Rectal cancer Neg Hx    Thyroid  disease Neg Hx    Social History   Socioeconomic History   Marital status: Divorced    Spouse name: Not on file   Number of children: 0   Years of education: Not on file   Highest education level: Not on file  Occupational History   Occupation: TEACHER ASSISTANT  Tobacco Use   Smoking status: Never   Smokeless tobacco: Never  Vaping Use   Vaping status: Never Used  Substance and Sexual Activity   Alcohol use: No   Drug use: No   Sexual activity: Yes  Other Topics Concern   Not on file  Social History Narrative   Not on file   Social Drivers of Health   Financial Resource Strain: Not on file  Food Insecurity: Food Insecurity Present (09/03/2019)   Hunger Vital Sign    Worried About Running Out of Food in the Last Year: Sometimes true    Ran Out of Food in the Last Year: Sometimes true  Transportation Needs: No Transportation Needs (09/03/2019)    PRAPARE - Administrator, Civil Service (Medical): No    Lack of Transportation (Non-Medical): No  Physical Activity: Not on file  Stress: Not on file  Social Connections: Not on file   Past Surgical History:  Procedure Laterality Date   ANAL EXAMINATION UNDER ANESTHESIA  02/21/2011   anal fistula   BREAST EXCISIONAL BIOPSY Left    BREAST SURGERY  patient does not remember date of procedure   pull fluid off lft br   CHOLECYSTECTOMY N/A 09/12/2018   Procedure: LAPAROSCOPIC CHOLECYSTECTOMY;  Surgeon: Aron Shoulders, MD;  [facility];  Service: General;  Laterality: N/A;   COLONOSCOPY  10/08/2016   2 day prep 1 TA,   ELECTROPHYSIOLOGIC STUDY N/A 05/05/2015   Procedure: SVT Ablation;  Surgeon: Will Gladis Norton, MD;  [facility];  Service: Cardiovascular;  Laterality: N/A;   EP IMPLANTABLE DEVICE N/A 01/30/2016   Procedure: Loop Recorder Insertion;  Surgeon: Jerel Balding, MD;  [facility];  Service: Cardiovascular;  Laterality: N/A;   INCISE AND DRAIN ABCESS     abscess on right thigh and buttock   KNEE ARTHROSCOPY     left   LAPAROSCOPIC APPENDECTOMY N/A 09/19/2018   Procedure: APPENDECTOMY LAPAROSCOPIC;  Surgeon: Signe Mitzie LABOR, MD;  [facility];  Service: General;  Laterality: N/A;  SHOULDER SURGERY  04/14/2009   right   Past Surgical History:  Procedure Laterality Date   ANAL EXAMINATION UNDER ANESTHESIA  02/21/2011   anal fistula   BREAST EXCISIONAL BIOPSY Left    BREAST SURGERY  patient does not remember date of procedure   pull fluid off lft br   CHOLECYSTECTOMY N/A 09/12/2018   Procedure: LAPAROSCOPIC CHOLECYSTECTOMY;  Surgeon: Aron Shoulders, MD;  [facility];  Service: General;  Laterality: N/A;   COLONOSCOPY  10/08/2016   2 day prep 1 TA,   ELECTROPHYSIOLOGIC STUDY N/A 05/05/2015   Procedure: SVT Ablation;  Surgeon: Will Gladis Norton, MD;  [facility];  Service: Cardiovascular;   Laterality: N/A;   EP IMPLANTABLE DEVICE N/A 01/30/2016   Procedure: Loop Recorder Insertion;  Surgeon: Jerel Balding, MD;  [facility];  Service: Cardiovascular;  Laterality: N/A;   INCISE AND DRAIN ABCESS     abscess on right thigh and buttock   KNEE ARTHROSCOPY     left   LAPAROSCOPIC APPENDECTOMY N/A 09/19/2018   Procedure: APPENDECTOMY LAPAROSCOPIC;  Surgeon: Signe Mitzie LABOR, MD;  [facility];  Service: General;  Laterality: N/A;   SHOULDER SURGERY  04/14/2009   right   Past Medical History:  Diagnosis Date   Abscess    increased drainage from abscess on buttock   Anal fistula    Anxiety    Bilateral hip pain 05/27/2015   Chronic pain syndrome 05/27/2015   Depression    sees Dr. Alvera   Diabetes mellitus without complication [facility])    Diverticulosis    Elevated alkaline phosphatase level    Fatty liver    Gallstones    Hiatal hernia    Hyperlipidemia    Hypertension    Internal hemorrhoids    Sleep apnea    2008- sleep study, neg. for sleep apnea    Stroke (HCC)    SVT (supraventricular tachycardia) (HCC)    Symptomatic cholelithiasis 09/11/2018   LMP 02/09/2017   Opioid Risk Score:   Fall Risk Score:  `1  Depression screen [facility] 2/9     08/07/2023    3:27 PM 07/16/2023    3:31 PM 05/08/2023    3:26 PM 03/21/2023    3:07 PM 01/16/2023    2:50 PM 11/15/2022    3:15 PM 09/17/2022    1:41 PM  Depression screen PHQ 2/9  Decreased Interest 0 0 0 0 0 0 0  Down, Depressed, Hopeless 0 0 0 0 0 0 0  PHQ - 2 Score 0 0 0 0 0 0 0     Review of Systems     Objective:   Physical Exam        Assessment & Plan:

## 2023-10-06 ENCOUNTER — Other Ambulatory Visit: Payer: Self-pay | Admitting: Medical

## 2023-10-06 ENCOUNTER — Other Ambulatory Visit: Payer: Self-pay | Admitting: Internal Medicine

## 2023-10-06 DIAGNOSIS — Z794 Long term (current) use of insulin: Secondary | ICD-10-CM

## 2023-10-06 DIAGNOSIS — Z87898 Personal history of other specified conditions: Secondary | ICD-10-CM

## 2023-10-06 DIAGNOSIS — I4719 Other supraventricular tachycardia: Secondary | ICD-10-CM

## 2023-10-07 ENCOUNTER — Encounter: Payer: Self-pay | Admitting: Medical

## 2023-10-07 ENCOUNTER — Ambulatory Visit: Payer: 59 | Admitting: Medical

## 2023-10-07 VITALS — BP 110/70 | HR 60 | Ht 63.0 in | Wt 236.6 lb

## 2023-10-07 DIAGNOSIS — E1122 Type 2 diabetes mellitus with diabetic chronic kidney disease: Secondary | ICD-10-CM

## 2023-10-07 DIAGNOSIS — N1832 Chronic kidney disease, stage 3b: Secondary | ICD-10-CM

## 2023-10-07 DIAGNOSIS — Z23 Encounter for immunization: Secondary | ICD-10-CM

## 2023-10-07 DIAGNOSIS — G894 Chronic pain syndrome: Secondary | ICD-10-CM | POA: Diagnosis not present

## 2023-10-07 DIAGNOSIS — Z794 Long term (current) use of insulin: Secondary | ICD-10-CM

## 2023-10-07 DIAGNOSIS — I10 Essential (primary) hypertension: Secondary | ICD-10-CM

## 2023-10-07 DIAGNOSIS — M8580 Other specified disorders of bone density and structure, unspecified site: Secondary | ICD-10-CM

## 2023-10-07 DIAGNOSIS — E042 Nontoxic multinodular goiter: Secondary | ICD-10-CM

## 2023-10-07 DIAGNOSIS — I5032 Chronic diastolic (congestive) heart failure: Secondary | ICD-10-CM | POA: Diagnosis not present

## 2023-10-07 DIAGNOSIS — E1169 Type 2 diabetes mellitus with other specified complication: Secondary | ICD-10-CM

## 2023-10-07 DIAGNOSIS — E559 Vitamin D deficiency, unspecified: Secondary | ICD-10-CM

## 2023-10-07 DIAGNOSIS — Z0000 Encounter for general adult medical examination without abnormal findings: Secondary | ICD-10-CM

## 2023-10-07 DIAGNOSIS — Z8639 Personal history of other endocrine, nutritional and metabolic disease: Secondary | ICD-10-CM

## 2023-10-07 DIAGNOSIS — E785 Hyperlipidemia, unspecified: Secondary | ICD-10-CM

## 2023-10-07 NOTE — Progress Notes (Signed)
 Subjective:   HPI  [name] is a 63 y.o. female who presents for Chief Complaint  Patient presents with   Annual Exam    Fasting cpe, no concerns. No eye exam done in the last year   Patient Care Team: Duquan Gillooly, Alm RAMAN, PA-C as PCP - General (Family Medicine) Dr. Donell Butts, as Consulting Physician (Endocrinology), [facility] Curvin Deward MOULD, MD as Consulting Physician (General Surgery) Francyne Headland, MD as Consulting Physician (Cardiology) New dentist in June Eye doctor - Dr. Brinda Maris [facility], Dr. Gordy Starch Dr. Arthea Gunther, Dr. Prentice Compton, pain management   Concerns: She notes compliance with medicines currently.    No new problems.  She recently was started on insulin  pump by endocrinology.  She had a colonoscopy this past year and a bone density test.  No current complaints.   Past Medical History:  Diagnosis Date   Abscess    increased drainage from abscess on buttock   Anal fistula    Anxiety    Bilateral hip pain 05/27/2015   Chronic pain syndrome 05/27/2015   Depression    sees Dr. Alvera   Diabetes mellitus without complication [facility])    Diverticulosis    Elevated alkaline phosphatase level    Fatty liver    Gallstones    Hiatal hernia    Hyperlipidemia    Hypertension    Internal hemorrhoids    Sleep apnea    2008- sleep study, neg. for sleep apnea    Stroke [facility])    SVT (supraventricular tachycardia) (HCC)    Symptomatic cholelithiasis 09/11/2018    Past Surgical History:  Procedure Laterality Date   ANAL EXAMINATION UNDER ANESTHESIA  02/21/2011   anal fistula   BREAST EXCISIONAL BIOPSY Left    BREAST SURGERY  patient does not remember date of procedure   pull fluid off lft br   CHOLECYSTECTOMY N/A 09/12/2018   Procedure: LAPAROSCOPIC CHOLECYSTECTOMY;  Surgeon: Aron Shoulders, MD;  Location: [facility] OR;  Service: General;  Laterality: N/A;   COLONOSCOPY  10/08/2016   2 day prep 1 TA,   ELECTROPHYSIOLOGIC STUDY  N/A 05/05/2015   Procedure: SVT Ablation;  Surgeon: Will Gladis Norton, MD;  Location: [facility] INVASIVE CV LAB;  Service: Cardiovascular;  Laterality: N/A;   EP IMPLANTABLE DEVICE N/A 01/30/2016   Procedure: Loop Recorder Insertion;  Surgeon: Headland Francyne, MD;  Location: [facility] INVASIVE CV LAB;  Service: Cardiovascular;  Laterality: N/A;   INCISE AND DRAIN ABCESS     abscess on right thigh and buttock   KNEE ARTHROSCOPY     left   LAPAROSCOPIC APPENDECTOMY N/A 09/19/2018   Procedure: APPENDECTOMY LAPAROSCOPIC;  Surgeon: Signe Mitzie LABOR, MD;  Location: [facility] OR;  Service: General;  Laterality: N/A;   SHOULDER SURGERY  04/14/2009   right    Social History   Socioeconomic History   Marital status: Divorced    Spouse name: Not on file   Number of children: 0   Years of education: Not on file   Highest education level: Not on file  Occupational History   Occupation: TEACHER ASSISTANT  Tobacco Use   Smoking status: Never   Smokeless tobacco: Never  Vaping Use   Vaping status: Never Used  Substance and Sexual Activity   Alcohol use: No   Drug use: No   Sexual activity: Yes  Other Topics Concern   Not on file  Social History Narrative   Lives alone, walks for exercise, is a Runner, broadcasting/film/video.  She plans to  retire December 2025   Social Drivers of Corporate investment banker Strain: Not on file  Food Insecurity: Food Insecurity Present (09/03/2019)   Hunger Vital Sign    Worried About Running Out of Food in the Last Year: Sometimes true    Ran Out of Food in the Last Year: Sometimes true  Transportation Needs: No Transportation Needs (09/03/2019)   PRAPARE - Administrator, Civil Service (Medical): No    Lack of Transportation (Non-Medical): No  Physical Activity: Not on file  Stress: Not on file  Social Connections: Not on file  Intimate Partner Violence: Not on file    Family History  Problem Relation Age of Onset   Hypertension Mother    Alzheimer's disease Mother     Diabetes Father    Breast cancer Sister 73   Anesthesia problems Neg Hx    Hypotension Neg Hx    Malignant hyperthermia Neg Hx    Pseudochol deficiency Neg Hx    Colon cancer Neg Hx    Esophageal cancer Neg Hx    Stomach cancer Neg Hx    Rectal cancer Neg Hx    Thyroid  disease Neg Hx      Current Outpatient Medications:    albuterol  (VENTOLIN  HFA) 108 (90 Base) MCG/ACT inhaler, Inhale 2 puffs into the lungs every 6 (six) hours as needed for wheezing or shortness of breath., Disp: 8 g, Rfl: 1   aspirin  EC 81 MG tablet, Take 1 tablet (81 mg total) by mouth daily., Disp: 90 tablet, Rfl: 3   budesonide -formoterol  (SYMBICORT ) 160-4.5 MCG/ACT inhaler, Inhale 2 puffs into the lungs 2 (two) times daily., Disp: 1 each, Rfl: 2   calcium  carbonate (CALCIUM  600) 600 MG TABS tablet, Take 1 tablet (600 mg total) by mouth 2 (two) times daily with a meal., Disp: 180 tablet, Rfl: 3   Continuous Glucose Sensor (DEXCOM G7 SENSOR) MISC, 1 Device by Does not apply route as directed., Disp: 9 each, Rfl: 3   fenofibrate  (TRICOR ) 145 MG tablet, Take 1 tablet (145 mg total) by mouth daily., Disp: 90 tablet, Rfl: 3   ferrous gluconate  (FERGON) 324 MG tablet, Take 1 tablet (324 mg total) by mouth daily with breakfast., Disp: 90 tablet, Rfl: 0   Glucagon  (GVOKE HYPOPEN  2-PACK) 0.5 MG/0.1ML SOAJ, Inject 1 Dose into the skin daily as needed., Disp: 0.1 mL, Rfl: 2   Insulin  Aspart, w/Niacinamide, (FIASP ) 100 UNIT/ML SOLN, Increase  Fiasp  40 units with each meal  Fiasp  correctional insulin : ADD extra units on insulin  to your meal-time Fiasp  dose if your blood sugars are higher than 150. Use the scale below to help guide you. Max daily 100 units, Disp: 100 mL, Rfl: 6   Insulin  Disposable Pump (OMNIPOD 5 G7 INTRO, GEN 5,) KIT, 1 Device by Does not apply route every other day., Disp: 1 kit, Rfl: 0   Insulin  Disposable Pump (OMNIPOD 5 G7 PODS, GEN 5,) MISC, 1 Device by Does not apply route every other day., Disp: 45 each, Rfl:  3   losartan  (COZAAR ) 25 MG tablet, Take 1 tablet by mouth once daily, Disp: 90 tablet, Rfl: 1   methocarbamol  (ROBAXIN ) 500 MG tablet, Take 1 tablet (500 mg total) by mouth every 8 (eight) hours as needed for muscle spasms., Disp: 60 tablet, Rfl: 2   naloxone (NARCAN) nasal spray 4 mg/0.1 mL, , Disp: , Rfl:    nortriptyline  (PAMELOR ) 75 MG capsule, Take 1 capsule by mouth at bedtime, Disp: 90 capsule,  Rfl: 1   omeprazole  (PRILOSEC ) 20 MG capsule, Take 1 capsule by mouth once daily, Disp: 90 capsule, Rfl: 1   oxyCODONE -acetaminophen  (PERCOCET) 7.5-325 MG tablet, Take 1 tablet by mouth 5 (five) times daily as needed., Disp: 150 tablet, Rfl: 0   pantoprazole  (PROTONIX ) 40 MG tablet, Take 1 tablet (40 mg total) by mouth daily., Disp: 90 tablet, Rfl: 0   potassium chloride  (KLOR-CON ) 10 MEQ tablet, Take 1 tablet (10 mEq total) by mouth 2 (two) times daily., Disp: 180 tablet, Rfl: 0   rosuvastatin  (CRESTOR ) 20 MG tablet, Take 1 tablet (20 mg total) by mouth daily., Disp: 90 tablet, Rfl: 3   spironolactone  (ALDACTONE ) 25 MG tablet, Take 0.5 tablets (12.5 mg total) by mouth daily., Disp: 30 tablet, Rfl: 0   sucralfate  (CARAFATE ) 1 g tablet, Take 1 tablet (1 g total) by mouth 4 (four) times daily., Disp: 30 tablet, Rfl: 0   DULoxetine  (CYMBALTA ) 60 MG capsule, Take 1 capsule by mouth twice daily, Disp: 180 capsule, Rfl: 3   furosemide  (LASIX ) 80 MG tablet, Take 1 tablet by mouth once daily, Disp: 90 tablet, Rfl: 3   Insulin  Pen Needle (PEN NEEDLES) 32G X 6 MM MISC, Use as instructed to inject insulin  in the morning, at noon, in the evening, and at bedtime, Disp: 400 each, Rfl: 3   metoprolol  succinate (TOPROL -XL) 25 MG 24 hr tablet, Take 1 tablet by mouth once daily, Disp: 90 tablet, Rfl: 3  Allergies  Allergen Reactions   Jardiance  [Empagliflozin ] Other (See Comments)    Recurrent yeast infections    Mounjaro  [Tirzepatide ] Other (See Comments)    Stomach cramps per patient    Ozempic  (0.25 Or 0.5  Mg-Dose) [Semaglutide (0.25 Or 0.5mg -Dos)]     Severe nausea, bad gas   Tramadol  Nausea Only    Reviewed their medical, surgical, family, social, medication, and allergy history and updated chart as appropriate.  Review of Systems  Constitutional:  Negative for chills, fever, malaise/fatigue and weight loss.  HENT:  Negative for congestion, ear pain, hearing loss, sore throat and tinnitus.   Eyes:  Negative for blurred vision, pain and redness.  Respiratory:  Negative for cough, hemoptysis and shortness of breath.   Cardiovascular:  Negative for chest pain, palpitations, orthopnea, claudication and leg swelling.  Gastrointestinal:  Negative for abdominal pain, blood in stool, constipation, diarrhea, nausea and vomiting.  Genitourinary:  Negative for dysuria, flank pain, frequency, hematuria and urgency.  Musculoskeletal:  Positive for back pain. Negative for falls, joint pain and myalgias.  Skin:  Negative for itching and rash.  Neurological:  Negative for dizziness, tingling, speech change, weakness and headaches.  Endo/Heme/Allergies:  Negative for polydipsia. Does not bruise/bleed easily.  Psychiatric/Behavioral:  Negative for depression and memory loss. The patient is not nervous/anxious and does not have insomnia.         Objective:  BP 110/70   Pulse 60   Ht 5' 3 (1.6 m)   Wt 236 lb 9.6 oz (107.3 kg)   LMP 02/09/2017   BMI 41.91 kg/m   General appearance: alert, no distress, WD/WN, Hispanic female Skin: Scarring of bilateral axillae from prior hidradenitis, scattered macules, otherwise unremarkable Neck: supple, no lymphadenopathy, no thyromegaly, no masses, normal ROM, no bruits Chest: non tender, normal shape and expansion Heart: RRR, normal S1, S2, no murmurs Lungs: CTA bilaterally, no wheezes, rhonchi, or rales Abdomen: +bs, soft, non tender, non distended, no masses, no hepatomegaly, no splenomegaly, no bruits Musculoskeletal: no obvious deformity or swelling.  Unsteady on feet, unchanged from prior Extremities: 1+ nonpitting edema, no cyanosis, no clubbing Pulses: 2+ symmetric, upper and 1+ lower extremities, normal cap refill Neurological:  alert, gait a little unsteady when trying to get on exam table, otherwise oriented x 3, CN2-12 intact, strength otherwise normal upper extremities and lower extremities, sensation normal throughout, DTRs 1+  Psychiatric: normal affect, behavior normal, pleasant  Breast/gyn - deferred to gyn  Diabetic Foot Exam - Simple   Simple Foot Form Diabetic Foot exam was performed with the following findings: Yes 10/07/2023  8:43 AM  Visual Inspection See comments: Yes Sensation Testing Intact to touch and monofilament testing bilaterally: Yes Pulse Check See comments: Yes Comments 1+ pedal pulses, flat feet      Assessment and Plan :   Encounter Diagnoses  Name Primary?   Encounter for health maintenance examination in adult Yes   CKD stage 3b, GFR 30-44 ml/min (HCC)    Chronic diastolic heart failure (HCC)    Chronic pain syndrome    Hyperlipidemia associated with type 2 diabetes mellitus (HCC)    Type 2 diabetes mellitus with stage 3b chronic kidney disease, with long-term current use of insulin  (HCC)    Vitamin D  deficiency    Osteopenia, unspecified location    Multinodular goiter    History of iron  deficiency    Essential hypertension, benign    Need for pneumococcal 20-valent conjugate vaccination     This visit was a preventative care visit, also known as wellness visit or routine physical.   Topics typically include healthy lifestyle, diet, exercise, preventative care, vaccinations, sick and well care, proper use of emergency dept and after hours care, as well as other concerns.     Recommendations: Continue to return yearly for your annual wellness and preventative care visits.  This gives us  a chance to discuss healthy lifestyle, exercise, vaccinations, review your chart record, and perform  screenings where appropriate.  I recommend you see your eye doctor yearly for routine vision care.  I recommend you see your dentist yearly for routine dental care including hygiene visits twice yearly.   Vaccination recommendations were reviewed Immunization History  Administered Date(s) Administered   Influenza Split 12/23/2020, 12/11/2022   Influenza,inj,Quad PF,6+ Mos 11/15/2017, 11/11/2018, 11/16/2021   Influenza-Unspecified 12/24/2013, 12/26/2015, 11/02/2016, 11/11/2018, 01/15/2020   PFIZER(Purple Top)SARS-COV-2 Vaccination 05/07/2019, 06/03/2019, 01/15/2020, 09/25/2020   PNEUMOCOCCAL CONJUGATE-20 10/07/2023   Pfizer Covid-19 Vaccine Bivalent Booster 52yrs & up 12/23/2020   Pfizer(Comirnaty)Fall Seasonal Vaccine 12 years and older 12/11/2022   Pneumococcal Polysaccharide-23 11/11/2018   Respiratory Syncytial Virus Vaccine,Recomb Aduvanted(Arexvy) 06/21/2022   Tdap 08/03/2017   Zoster Recombinant(Shingrix) 11/11/2018, 01/29/2019   Counseled on the pneumococcal vaccine.  Vaccine information sheet given.  Pneumococcal vaccine Prevnar 20 given after consent obtained.   Screening for cancer: Colon cancer screening: I reviewed your colonoscopy on file from 09/2022, repeat in 7 years  Breast cancer screening: You should perform a self breast exam monthly.   Continue yearly screening mammogram  Cervical cancer screening: Advised f/u with gynecology Pap smear up to date   Skin cancer screening: Check your skin regularly for new changes, growing lesions, or other lesions of concern Come in for evaluation if you have skin lesions of concern.  Lung cancer screening: If you have a greater than 20 pack year history of tobacco use, then you may qualify for lung cancer screening with a chest CT scan.   Please call your insurance company to inquire about coverage for this test.  We  currently don't have screenings for other cancers besides breast, cervical, colon, and lung cancers.  If  you have a strong family history of cancer or have other cancer screening concerns, please let me know.    Bone health: Get at least 150 minutes of aerobic exercise weekly Get weight bearing exercise at least once weekly Bone density test:  A bone density test is an imaging test that uses a type of X-ray to measure the amount of calcium  and other minerals in your bones. The test may be used to diagnose or screen you for a condition that causes weak or thin bones (osteoporosis), predict your risk for a broken bone (fracture), or determine how well your osteoporosis treatment is working. The bone density test is recommended for females 65 and older, or females or males <65 if certain risk factors such as thyroid  disease, long term use of steroids such as for asthma or rheumatological issues, vitamin D  deficiency, estrogen deficiency, family history of osteoporosis, self or family history of fragility fracture in first degree relative.  Bone density test reviewed from January 2025 showing low bone mass or osteopenia  Heart health: Get at least 150 minutes of aerobic exercise weekly Limit alcohol It is important to maintain a healthy blood pressure and healthy cholesterol numbers  Heart disease screening: Screening for heart disease includes screening for blood pressure, fasting lipids, glucose/diabetes screening, BMI height to weight ratio, reviewed of smoking status, physical activity, and diet.    Goals include blood pressure 120/80 or less, maintaining a healthy lipid/cholesterol profile, preventing diabetes or keeping diabetes numbers under good control, not smoking or using tobacco products, exercising most days per week or at least 150 minutes per week of exercise, and eating healthy variety of fruits and vegetables, healthy oils, and avoiding unhealthy food choices like fried food, fast food, high sugar and high cholesterol foods.    Echocardiogram reviewed from September 2022, LVEF 60 to  65%, no regional wall motion abnormality, there is grade 1 diastolic dysfunction  ABI test abnormal 09/2022    Medical care options: I recommend you continue to seek care here first for routine care.  We try really hard to have available appointments Monday through Friday daytime hours for sick visits, acute visits, and physicals.  Urgent care should be used for after hours and weekends for significant issues that cannot wait till the next day.  The emergency department should be used for significant potentially life-threatening emergencies.  The emergency department is expensive, can often have long wait times for less significant concerns, so try to utilize primary care, urgent care, or telemedicine when possible to avoid unnecessary trips to the emergency department.  Virtual visits and telemedicine have been introduced since the pandemic started in 2020, and can be convenient ways to receive medical care.  We offer virtual appointments as well to assist you in a variety of options to seek medical care.   Advanced Directives: I recommend you consider completing a Health Care Power of Attorney and Living Will.   These documents respect your wishes and help alleviate burdens on your loved ones if you were to become terminally ill or be in a position to need those documents enforced.    You can complete Advanced Directives yourself, have them notarized, then have copies made for our office, for you and for anybody you feel should have them in safe keeping.  Or, you can have an attorney prepare these documents.   If you haven't updated your Last  Will and Testament in a while, it may be worthwhile having an attorney prepare these documents together and save on some costs.      Separate significant issues discussed:  CKD 3B-updated labs today  Diabetes-continue routine follow-up with endocrinology, just recently started with insulin  pump  Hypertension-continue Toprol -XL 25 mg daily,  spironolactone  25 mg daily, losartan  25 mg daily.  Hyperlipidemia-continue rosuvastatin  Crestor  20 mg daily and fenofibrate  145 mg daily, aspirin  81 mg daily  Goiter-updated labs today for thyroid   Chronic sees pain management, compliant with nortriptyline , Cymbalta , Robaxin   Vitamin D  deficiency-updated labs today  Osteopenia-see notation above under bone density  History of iron  deficiency-last few times we checked iron  has been normal, so change to 2 days/week of iron  therapy  Chronic heart failure - follow up with cardiology for yearly check in  Obesity-discussed need for weight loss through healthy diet and exercise   Anxiety, PTSD- continue current medication    Savita was seen today for annual exam.  Diagnoses and all orders for this visit:  Encounter for health maintenance examination in adult -     TSH + free T4 -     Comprehensive metabolic panel with GFR -     Lipid panel -     CBC -     VITAMIN D  25 Hydroxy (Vit-D Deficiency, Fractures)  CKD stage 3b, GFR 30-44 ml/min (HCC) -     Comprehensive metabolic panel with GFR  Chronic diastolic heart failure (HCC)  Chronic pain syndrome  Hyperlipidemia associated with type 2 diabetes mellitus (HCC) -     Lipid panel  Type 2 diabetes mellitus with stage 3b chronic kidney disease, with long-term current use of insulin  (HCC)  Vitamin D  deficiency -     VITAMIN D  25 Hydroxy (Vit-D Deficiency, Fractures)  Osteopenia, unspecified location  Multinodular goiter -     TSH + free T4  History of iron  deficiency  Essential hypertension, benign  Need for pneumococcal 20-valent conjugate vaccination -     Pneumococcal conjugate vaccine 20-valent (Prevnar 20)    Follow-up pending labs, yearly for physical

## 2023-10-08 ENCOUNTER — Ambulatory Visit: Payer: Self-pay | Admitting: Medical

## 2023-10-08 ENCOUNTER — Other Ambulatory Visit: Payer: Self-pay | Admitting: Medical

## 2023-10-08 LAB — COMPREHENSIVE METABOLIC PANEL WITH GFR
ALT: 17 IU/L (ref 0–32)
AST: 14 IU/L (ref 0–40)
Albumin: 4.2 g/dL (ref 3.9–4.9)
Alkaline Phosphatase: 67 IU/L (ref 44–121)
BUN/Creatinine Ratio: 16 (ref 12–28)
BUN: 25 mg/dL (ref 8–27)
Bilirubin Total: 0.3 mg/dL (ref 0.0–1.2)
CO2: 22 mmol/L (ref 20–29)
Calcium: 10.7 mg/dL — ABNORMAL HIGH (ref 8.7–10.3)
Chloride: 102 mmol/L (ref 96–106)
Creatinine, Ser: 1.61 mg/dL — ABNORMAL HIGH (ref 0.57–1.00)
Globulin, Total: 2.1 g/dL (ref 1.5–4.5)
Glucose: 121 mg/dL — ABNORMAL HIGH (ref 70–99)
Potassium: 4.2 mmol/L (ref 3.5–5.2)
Sodium: 139 mmol/L (ref 134–144)
Total Protein: 6.3 g/dL (ref 6.0–8.5)
eGFR: 36 mL/min/1.73 — ABNORMAL LOW (ref >59)

## 2023-10-08 LAB — TSH+FREE T4
Free T4: 0.99 ng/dL (ref 0.82–1.77)
TSH: 0.284 u[IU]/mL — ABNORMAL LOW (ref 0.450–4.500)

## 2023-10-08 LAB — LIPID PANEL
Chol/HDL Ratio: 4.3 ratio (ref 0.0–4.4)
Cholesterol, Total: 153 mg/dL (ref 100–199)
HDL: 36 mg/dL — ABNORMAL LOW (ref >39)
LDL Chol Calc (NIH): 93 mg/dL (ref 0–99)
Triglycerides: 137 mg/dL (ref 0–149)
VLDL Cholesterol Cal: 24 mg/dL (ref 5–40)

## 2023-10-08 LAB — CBC
Hematocrit: 40.1 % (ref 34.0–46.6)
Hemoglobin: 12.9 g/dL (ref 11.1–15.9)
MCH: 27.4 pg (ref 26.6–33.0)
MCHC: 32.2 g/dL (ref 31.5–35.7)
MCV: 85 fL (ref 79–97)
Platelets: 354 x10E3/uL (ref 150–450)
RBC: 4.71 x10E6/uL (ref 3.77–5.28)
RDW: 14.4 % (ref 11.7–15.4)
WBC: 8.8 x10E3/uL (ref 3.4–10.8)

## 2023-10-08 LAB — VITAMIN D 25 HYDROXY (VIT D DEFICIENCY, FRACTURES): Vit D, 25-Hydroxy: 17.5 ng/mL — AB (ref 30.0–100.0)

## 2023-10-08 MED ORDER — SPIRONOLACTONE 25 MG PO TABS: 12.5000 mg | ORAL_TABLET | Freq: Every day | ORAL | 2 refills | Status: AC

## 2023-10-08 MED ORDER — VITAMIN D (ERGOCALCIFEROL) 1.25 MG (50000 UNIT) PO CAPS: 50000.0000 [IU] | ORAL_CAPSULE | ORAL | 3 refills | Status: DC

## 2023-10-08 MED ORDER — BUDESONIDE-FORMOTEROL FUMARATE 160-4.5 MCG/ACT IN AERO: 2.0000 | INHALATION_SPRAY | Freq: Two times a day (BID) | RESPIRATORY_TRACT | 5 refills | Status: AC

## 2023-10-08 MED ORDER — VITAMIN D (ERGOCALCIFEROL) 1.25 MG (50000 UNIT) PO CAPS: 50000.0000 [IU] | ORAL_CAPSULE | ORAL | 3 refills | Status: AC

## 2023-10-08 MED ORDER — POTASSIUM CHLORIDE ER 10 MEQ PO TBCR: 10.0000 meq | EXTENDED_RELEASE_TABLET | Freq: Two times a day (BID) | ORAL | 2 refills | Status: AC

## 2023-10-08 NOTE — Progress Notes (Signed)
 orders

## 2023-10-08 NOTE — Progress Notes (Signed)
 Results thru my chart.  Labs CC to endo

## 2023-10-17 ENCOUNTER — Encounter: Attending: Registered Nurse | Admitting: Registered Nurse

## 2023-10-17 ENCOUNTER — Encounter: Payer: Self-pay | Admitting: Registered Nurse

## 2023-10-17 VITALS — BP 122/62 | HR 89 | Ht 63.0 in | Wt 240.6 lb

## 2023-10-17 DIAGNOSIS — Z79891 Long term (current) use of opiate analgesic: Secondary | ICD-10-CM | POA: Insufficient documentation

## 2023-10-17 DIAGNOSIS — M7062 Trochanteric bursitis, left hip: Secondary | ICD-10-CM | POA: Insufficient documentation

## 2023-10-17 DIAGNOSIS — M47817 Spondylosis without myelopathy or radiculopathy, lumbosacral region: Secondary | ICD-10-CM | POA: Insufficient documentation

## 2023-10-17 DIAGNOSIS — G894 Chronic pain syndrome: Secondary | ICD-10-CM | POA: Insufficient documentation

## 2023-10-17 DIAGNOSIS — M47816 Spondylosis without myelopathy or radiculopathy, lumbar region: Secondary | ICD-10-CM | POA: Insufficient documentation

## 2023-10-17 DIAGNOSIS — Z5181 Encounter for therapeutic drug level monitoring: Secondary | ICD-10-CM | POA: Diagnosis not present

## 2023-10-17 DIAGNOSIS — M7061 Trochanteric bursitis, right hip: Secondary | ICD-10-CM | POA: Insufficient documentation

## 2023-10-17 DIAGNOSIS — M5416 Radiculopathy, lumbar region: Secondary | ICD-10-CM | POA: Insufficient documentation

## 2023-10-17 MED ORDER — OXYCODONE-ACETAMINOPHEN 7.5-325 MG PO TABS
1.0000 | ORAL_TABLET | Freq: Every day | ORAL | 0 refills | Status: DC | PRN
Start: 2023-10-17 — End: 2023-10-17

## 2023-10-17 MED ORDER — OXYCODONE-ACETAMINOPHEN 7.5-325 MG PO TABS
1.0000 | ORAL_TABLET | Freq: Every day | ORAL | 0 refills | Status: DC | PRN
Start: 2023-10-17 — End: 2023-11-06

## 2023-10-17 NOTE — Progress Notes (Signed)
 Subjective:    Patient ID: [name], female    DOB: 10/25/1960, 63 y.o.   MRN: [id number]  HPI: [name] is a 63 y.o. female who returns for follow up appointment for chronic pain and medication refill. She states her pain is located in her lower back and bilateral hips. She rates her pain 8. Her current exercise regime is walking and performing stretching exercises.  [name] Morphine  equivalent is 56.25 MME.   Last Oral Swab was Performed on 05/16/2023, it was consistent.     Pain Inventory Average Pain 8 Pain Right Now 8 My pain is sharp, stabbing, and aching  In the last 24 hours, has pain interfered with the following? General activity 8 Relation with others 8 Enjoyment of life 7 What TIME of day is your pain at its worst? daytime and evening Sleep (in general) Fair  Pain is worse with: walking, bending, standing, and unsure Pain improves with: rest, heat/ice, and medication Relief from Meds: 5  Family History  Problem Relation Age of Onset   Hypertension Mother    Alzheimer's disease Mother    Diabetes Father    Breast cancer Sister 34   Anesthesia problems Neg Hx    Hypotension Neg Hx    Malignant hyperthermia Neg Hx    Pseudochol deficiency Neg Hx    Colon cancer Neg Hx    Esophageal cancer Neg Hx    Stomach cancer Neg Hx    Rectal cancer Neg Hx    Thyroid  disease Neg Hx    Social History   Socioeconomic History   Marital status: Divorced    Spouse name: Not on file   Number of children: 0   Years of education: Not on file   Highest education level: Not on file  Occupational History   Occupation: TEACHER ASSISTANT  Tobacco Use   Smoking status: Never   Smokeless tobacco: Never  Vaping Use   Vaping status: Never Used  Substance and Sexual Activity   Alcohol use: No   Drug use: No   Sexual activity: Yes  Other Topics Concern   Not on file  Social History Narrative   Lives alone, walks for exercise, is a Runner, broadcasting/film/video.  She  plans to retire December 2025   Social Drivers of Corporate investment banker Strain: Not on file  Food Insecurity: Food Insecurity Present (09/03/2019)   Hunger Vital Sign    Worried About Running Out of Food in the Last Year: Sometimes true    Ran Out of Food in the Last Year: Sometimes true  Transportation Needs: No Transportation Needs (09/03/2019)   PRAPARE - Administrator, Civil Service (Medical): No    Lack of Transportation (Non-Medical): No  Physical Activity: Not on file  Stress: Not on file  Social Connections: Not on file   Past Surgical History:  Procedure Laterality Date   ANAL EXAMINATION UNDER ANESTHESIA  02/21/2011   anal fistula   BREAST EXCISIONAL BIOPSY Left    BREAST SURGERY  patient does not remember date of procedure   pull fluid off lft br   CHOLECYSTECTOMY N/A 09/12/2018   Procedure: LAPAROSCOPIC CHOLECYSTECTOMY;  Surgeon: Aron Shoulders, MD;  [facility];  Service: General;  Laterality: N/A;   COLONOSCOPY  10/08/2016   2 day prep 1 TA,   ELECTROPHYSIOLOGIC STUDY N/A 05/05/2015   Procedure: SVT Ablation;  Surgeon: Will Gladis Norton, MD;  [facility];  Service: Cardiovascular;  Laterality: N/A;  EP IMPLANTABLE DEVICE N/A 01/30/2016   Procedure: Loop Recorder Insertion;  Surgeon: Jerel Balding, MD;  [facility];  Service: Cardiovascular;  Laterality: N/A;   INCISE AND DRAIN ABCESS     abscess on right thigh and buttock   KNEE ARTHROSCOPY     left   LAPAROSCOPIC APPENDECTOMY N/A 09/19/2018   Procedure: APPENDECTOMY LAPAROSCOPIC;  Surgeon: Signe Mitzie LABOR, MD;  [facility];  Service: General;  Laterality: N/A;   SHOULDER SURGERY  04/14/2009   right   Past Surgical History:  Procedure Laterality Date   ANAL EXAMINATION UNDER ANESTHESIA  02/21/2011   anal fistula   BREAST EXCISIONAL BIOPSY Left    BREAST SURGERY  patient does not remember date of procedure   pull fluid off lft br    CHOLECYSTECTOMY N/A 09/12/2018   Procedure: LAPAROSCOPIC CHOLECYSTECTOMY;  Surgeon: Aron Shoulders, MD;  [facility];  Service: General;  Laterality: N/A;   COLONOSCOPY  10/08/2016   2 day prep 1 TA,   ELECTROPHYSIOLOGIC STUDY N/A 05/05/2015   Procedure: SVT Ablation;  Surgeon: Will Gladis Norton, MD;  [facility];  Service: Cardiovascular;  Laterality: N/A;   EP IMPLANTABLE DEVICE N/A 01/30/2016   Procedure: Loop Recorder Insertion;  Surgeon: Jerel Balding, MD;  [facility];  Service: Cardiovascular;  Laterality: N/A;   INCISE AND DRAIN ABCESS     abscess on right thigh and buttock   KNEE ARTHROSCOPY     left   LAPAROSCOPIC APPENDECTOMY N/A 09/19/2018   Procedure: APPENDECTOMY LAPAROSCOPIC;  Surgeon: Signe Mitzie LABOR, MD;  [facility];  Service: General;  Laterality: N/A;   SHOULDER SURGERY  04/14/2009   right   Past Medical History:  Diagnosis Date   Abscess    increased drainage from abscess on buttock   Anal fistula    Anxiety    Bilateral hip pain 05/27/2015   Chronic pain syndrome 05/27/2015   Depression    sees Dr. Alvera   Diabetes mellitus without complication [facility])    Diverticulosis    Elevated alkaline phosphatase level    Fatty liver    Gallstones    Hiatal hernia    Hyperlipidemia    Hypertension    Internal hemorrhoids    Sleep apnea    2008- sleep study, neg. for sleep apnea    Stroke [facility])    SVT (supraventricular tachycardia) (HCC)    Symptomatic cholelithiasis 09/11/2018   BP 122/62 (BP Location: Left Arm, Patient Position: Sitting, Cuff Size: Large)   Pulse 89   Ht 5' 3 (1.6 m)   Wt 240 lb 9.6 oz (109.1 kg)   LMP 02/09/2017   SpO2 98%   BMI 42.62 kg/m   Opioid Risk Score:   Fall Risk Score:  `1  Depression screen [facility] 2/9     10/17/2023    2:52 PM 10/07/2023    8:18 AM 08/07/2023    3:27 PM 07/16/2023    3:31 PM 05/08/2023    3:26 PM 03/21/2023    3:07 PM 01/16/2023    2:50 PM  Depression screen PHQ 2/9   Decreased Interest 0 0 0 0 0 0 0  Down, Depressed, Hopeless 0 0 0 0 0 0 0  PHQ - 2 Score 0 0 0 0 0 0 0     Review of Systems  Musculoskeletal:  Positive for back pain.       Low back pain radiating down into bilateral legs, bilateral  hip pain  All other systems reviewed and are negative.      Objective:   Physical Exam Vitals and nursing note reviewed.  Constitutional:      Appearance: Normal appearance. She is obese.  Cardiovascular:     Rate and Rhythm: Normal rate and regular rhythm.     Pulses: Normal pulses.     Heart sounds: Normal heart sounds.  Pulmonary:     Effort: Pulmonary effort is normal.     Breath sounds: Normal breath sounds.  Musculoskeletal:     Comments: Normal Muscle Bulk and Muscle Testing Reveals:  Upper Extremities: Full ROM and Muscle Strength 5/5 Lumbar Paraspinal Tenderness: L-4-L-5 Lower Extremities: Full ROM and Muscle Strength 5/5 Bilateral Lower Extremities Flexion Produces Pain into her Lumbar Arises from Table slowly  Antalgic  Gait     Skin:    General: Skin is warm and dry.  Neurological:     Mental Status: She is alert and oriented to person, place, and time.  Psychiatric:        Mood and Affect: Mood normal.        Behavior: Behavior normal.          Assessment & Plan:  1.Chronic Bilateral Hip pain L>R---endstage OA of hips..Continue HEP as tolerated and Heat and Ice Therapy. 10/17/2023 Refilled: Oxycodone  7.5/325 one tablet 4 times a day as needed for moderate pain #120. Second script sent for the following month.10/17/2023 We will continue the opioid monitoring program, this consists of regular clinic visits, examinations, urine drug screen, pill counts as well as use of [place]  Controlled Substance Reporting system. A 12 month History has been reviewed on the [place]  Controlled Substance Reporting System on 10/17/2023 2. Lumbar Radiculitis:  Continue medication regimen with Pamelor . Continue Cymbalta  and  Continue  HEP as Tolerated.  Continue to Monitor. 10/17/2023.          -              3. Morbid obesity: Continue with healthy diet Regime and HEP. Encouraged to continue with healthy diet regime and HEP. 10/17/2023 4.Bilateral  Greater Trochanteric Bursitis:.Continue with Heat and Ice Therapy. Continue to Monitor. 10/17/2023.   F/U with Dr Babs per note

## 2023-10-23 ENCOUNTER — Other Ambulatory Visit: Payer: Self-pay | Admitting: Internal Medicine

## 2023-10-23 DIAGNOSIS — E1159 Type 2 diabetes mellitus with other circulatory complications: Secondary | ICD-10-CM

## 2023-10-23 DIAGNOSIS — Z794 Long term (current) use of insulin: Secondary | ICD-10-CM

## 2023-10-24 ENCOUNTER — Other Ambulatory Visit (HOSPITAL_COMMUNITY): Payer: Self-pay

## 2023-11-03 ENCOUNTER — Other Ambulatory Visit: Payer: Self-pay | Admitting: Physical Medicine & Rehabilitation

## 2023-11-03 DIAGNOSIS — G894 Chronic pain syndrome: Secondary | ICD-10-CM

## 2023-11-03 DIAGNOSIS — M47816 Spondylosis without myelopathy or radiculopathy, lumbar region: Secondary | ICD-10-CM

## 2023-11-03 DIAGNOSIS — M629 Disorder of muscle, unspecified: Secondary | ICD-10-CM

## 2023-11-06 ENCOUNTER — Encounter: Payer: Self-pay | Admitting: Physical Medicine & Rehabilitation

## 2023-11-06 ENCOUNTER — Encounter (HOSPITAL_BASED_OUTPATIENT_CLINIC_OR_DEPARTMENT_OTHER): Admitting: Physical Medicine & Rehabilitation

## 2023-11-06 VITALS — BP 121/70 | HR 80 | Ht 63.0 in | Wt 246.8 lb

## 2023-11-06 DIAGNOSIS — M5416 Radiculopathy, lumbar region: Secondary | ICD-10-CM | POA: Diagnosis not present

## 2023-11-06 DIAGNOSIS — G894 Chronic pain syndrome: Secondary | ICD-10-CM | POA: Diagnosis not present

## 2023-11-06 DIAGNOSIS — M47817 Spondylosis without myelopathy or radiculopathy, lumbosacral region: Secondary | ICD-10-CM | POA: Diagnosis not present

## 2023-11-06 DIAGNOSIS — M47816 Spondylosis without myelopathy or radiculopathy, lumbar region: Secondary | ICD-10-CM | POA: Diagnosis not present

## 2023-11-06 MED ORDER — PREGABALIN 50 MG PO CAPS: 50.0000 mg | ORAL_CAPSULE | Freq: Two times a day (BID) | ORAL | 2 refills | Status: AC

## 2023-11-06 MED ORDER — OXYCODONE-ACETAMINOPHEN 10-325 MG PO TABS: 1.0000 | ORAL_TABLET | Freq: Every day | ORAL | 0 refills | Status: DC

## 2023-11-06 NOTE — Progress Notes (Signed)
 Subjective:    Patient ID: [name], female    DOB: 08-25-1960, 63 y.o.   MRN: [id number]  HPI  Belvie is here in follow up of her low back pain and hip pain. She was doing better up until a week ago when she began to develop shooting pain down her legs, especially on the left side. She finds that nothing helps with the pain. She tried heat and cold. She describes it as a constant aching which won't stop.  She is unable to sleep.  Her Percocet is not touching the pain running down the leg.  She feels miserable.  She is doing some stretching at home.  A friend of hers is helping with that.  She still struggles with her weight.  He does try to keep better control of her diabetes and now has an insulin  pump.  She has continued to work but finds it increasingly difficult   Pain Inventory Average Pain 8 Pain Right Now 10 My pain is sharp, stabbing, and aching  In the last 24 hours, has pain interfered with the following? General activity 9 Relation with others 10 Enjoyment of life 10 What TIME of day is your pain at its worst? daytime, evening, and night Sleep (in general) NA  Pain is worse with: walking, bending, standing, and some activites Pain improves with: rest, heat/ice, and medication Relief from Meds: 8  Family History  Problem Relation Age of Onset   Hypertension Mother    Alzheimer's disease Mother    Diabetes Father    Breast cancer Sister 27   Anesthesia problems Neg Hx    Hypotension Neg Hx    Malignant hyperthermia Neg Hx    Pseudochol deficiency Neg Hx    Colon cancer Neg Hx    Esophageal cancer Neg Hx    Stomach cancer Neg Hx    Rectal cancer Neg Hx    Thyroid  disease Neg Hx    Social History   Socioeconomic History   Marital status: Divorced    Spouse name: Not on file   Number of children: 0   Years of education: Not on file   Highest education level: Not on file  Occupational History   Occupation: TEACHER ASSISTANT  Tobacco Use    Smoking status: Never   Smokeless tobacco: Never  Vaping Use   Vaping status: Never Used  Substance and Sexual Activity   Alcohol use: No   Drug use: No   Sexual activity: Yes  Other Topics Concern   Not on file  Social History Narrative   Lives alone, walks for exercise, is a Runner, broadcasting/film/video.  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   Social Drivers of Corporate investment banker Strain: Not on file  Food Insecurity: Food Insecurity Present (09/03/2019)   Hunger Vital Sign    Worried About Running Out of Food in the Last Year: Sometimes true    Ran Out of Food in the Last Year: Sometimes true  Transportation Needs: No Transportation Needs (09/03/2019)   PRAPARE - Administrator, Civil Service (Medical): No    Lack of Transportation (Non-Medical): No  Physical Activity: Not on file  Stress: Not on file  Social Connections: Not on file   Past Surgical History:  Procedure Laterality Date   ANAL EXAMINATION UNDER ANESTHESIA  02/21/2011   anal fistula   BREAST EXCISIONAL BIOPSY Left    BREAST SURGERY  patient does not remember date of procedure   pull fluid  off lft br   CHOLECYSTECTOMY N/A 09/12/2018   Procedure: LAPAROSCOPIC CHOLECYSTECTOMY;  Surgeon: Aron Shoulders, MD;  [facility];  Service: General;  Laterality: N/A;   COLONOSCOPY  10/08/2016   2 day prep 1 TA,   ELECTROPHYSIOLOGIC STUDY N/A 05/05/2015   Procedure: SVT Ablation;  Surgeon: Will Gladis Norton, MD;  [facility];  Service: Cardiovascular;  Laterality: N/A;   EP IMPLANTABLE DEVICE N/A 01/30/2016   Procedure: Loop Recorder Insertion;  Surgeon: Jerel Balding, MD;  [facility];  Service: Cardiovascular;  Laterality: N/A;   INCISE AND DRAIN ABCESS     abscess on right thigh and buttock   KNEE ARTHROSCOPY     left   LAPAROSCOPIC APPENDECTOMY N/A 09/19/2018   Procedure: APPENDECTOMY LAPAROSCOPIC;  Surgeon: Signe Mitzie LABOR, MD;  [facility];  Service: General;  Laterality:  N/A;   SHOULDER SURGERY  04/14/2009   right   Past Surgical History:  Procedure Laterality Date   ANAL EXAMINATION UNDER ANESTHESIA  02/21/2011   anal fistula   BREAST EXCISIONAL BIOPSY Left    BREAST SURGERY  patient does not remember date of procedure   pull fluid off lft br   CHOLECYSTECTOMY N/A 09/12/2018   Procedure: LAPAROSCOPIC CHOLECYSTECTOMY;  Surgeon: Aron Shoulders, MD;  [facility];  Service: General;  Laterality: N/A;   COLONOSCOPY  10/08/2016   2 day prep 1 TA,   ELECTROPHYSIOLOGIC STUDY N/A 05/05/2015   Procedure: SVT Ablation;  Surgeon: Will Gladis Norton, MD;  [facility];  Service: Cardiovascular;  Laterality: N/A;   EP IMPLANTABLE DEVICE N/A 01/30/2016   Procedure: Loop Recorder Insertion;  Surgeon: Jerel Balding, MD;  [facility];  Service: Cardiovascular;  Laterality: N/A;   INCISE AND DRAIN ABCESS     abscess on right thigh and buttock   KNEE ARTHROSCOPY     left   LAPAROSCOPIC APPENDECTOMY N/A 09/19/2018   Procedure: APPENDECTOMY LAPAROSCOPIC;  Surgeon: Signe Mitzie LABOR, MD;  [facility];  Service: General;  Laterality: N/A;   SHOULDER SURGERY  04/14/2009   right   Past Medical History:  Diagnosis Date   Abscess    increased drainage from abscess on buttock   Anal fistula    Anxiety    Bilateral hip pain 05/27/2015   Chronic pain syndrome 05/27/2015   Depression    sees Dr. Alvera   Diabetes mellitus without complication [facility])    Diverticulosis    Elevated alkaline phosphatase level    Fatty liver    Gallstones    Hiatal hernia    Hyperlipidemia    Hypertension    Internal hemorrhoids    Sleep apnea    2008- sleep study, neg. for sleep apnea    Stroke [facility])    SVT (supraventricular tachycardia) (HCC)    Symptomatic cholelithiasis 09/11/2018   BP 121/70   Pulse 80   Ht 5' 3 (1.6 m)   Wt 246 lb 12.8 oz (111.9 kg)   LMP 02/09/2017   SpO2 98%   BMI 43.72 kg/m   Opioid Risk Score:   Fall Risk  Score:  `1  Depression screen [facility] 2/9     11/06/2023    3:43 PM 10/17/2023    2:52 PM 10/07/2023    8:18 AM 08/07/2023    3:27 PM 07/16/2023    3:31 PM 05/08/2023    3:26 PM 03/21/2023    3:07 PM  Depression screen PHQ 2/9  Decreased Interest 0 0 0 0 0 0 0  Down, Depressed, Hopeless 0 0 0 0 0 0 0  PHQ - 2 Score 0 0 0 0 0 0 0    Review of Systems  Musculoskeletal:  Positive for back pain and gait problem.  All other systems reviewed and are negative.      Objective:   Physical Exam Gen: no distress, normal appearing, morbidly obese HEENT: oral mucosa pink and moist, NCAT Cardio: Reg rate Chest: normal effort, normal rate of breathing Abd: soft, non-distended Ext: no edema Psych: pleasant, normal affect Skin: intact Neuro: Alert and oriented x 3. Normal insight and awareness. Intact Memory. Normal language and speech. Cranial nerve exam unremarkable. MMT: She is 5 out of 5 in both upper extremities.  Lower extremities are grossly 5 out of 5 as well with normal sensation.  Reflexes are 1+.  Gait is wide-based..    Musculoskeletal: She has tenderness over the lumbar paraspinals from about L3 downward.  There is pain at both PSIS areas and she is tender additionally in the bilateral gluteal areas and in the hamstrings.  With range of motion testing she is limited with flexion and extension.  Facet maneuvers seem to cause the most pain today.  She is tight with any types of range of motion forward in both of her hamstrings and glutes.  Seated slump test only triggered pain in a pool in her hamstrings bilaterally left more than right.           Assessment & Plan:  1. Chronic B/l Hip pain L>R---Mod OA left hip by MRI and mild OA right hip by MRI             -- -We will continue the controlled substance monitoring program, this consists of regular clinic visits, examinations, routine drug screening, pill counts as well as use of [place]  Controlled Substance Reporting System. [place]  [place] today.    .              -Continue extra Tylenol  within recommended limitations -continue with HEP as possible         -won't pursue greater trochanter injections in future         - Continue d Robaxin  500 mg every 8 hours as needed #60 for muscle spasm         - have discussed the importance of weight loss as she has remained morbidly obese and this certainly is impacting her lower half        2.  Acute on chronic low back pain now with concerns over lumbar radiculopathy on the left, potentially at L4 or L5. - Arrange follow-up MRI lumbar spine without contrast -Percocet to 10/325 5 times daily #150.  She can hold onto the remaining 7.5 mg tabs for now -Begin trial of Lyrica  50 mg nightly for 4 nights then 50 mg twice daily thereafter 2.  Slow transit constipation                -senokot s for constipation.         3. Morbid obesity             diet has been reviewed             -So we discussed this above 4. Sleep: This had improved with Pamelor  75 mg nightly prior to her back giving her problems.      Twenty minutes of face to face patient care time  were spent during this visit. All questions were encouraged and answered.  Follow up with me based on MRI findings.

## 2023-11-06 NOTE — Patient Instructions (Addendum)
 ALWAYS FEEL FREE TO CALL OUR OFFICE WITH ANY PROBLEMS OR QUESTIONS [phone])  **PLEASE NOTE** ALL MEDICATION REFILL REQUESTS (INCLUDING CONTROLLED SUBSTANCES) NEED TO BE MADE AT LEAST 7 DAYS PRIOR TO REFILL BEING DUE. ANY REFILL REQUESTS INSIDE THAT TIME FRAME MAY RESULT IN DELAYS IN RECEIVING YOUR PRESCRIPTION.                    MAINTAIN MOBILITY AND SOME STRETCHING!!  LYRICA --NERVE PAIN MED  TAKE 50MG  AT NIGHT FOR 4 DAYS THEN 50MG  TWICE DAILY.

## 2023-11-18 ENCOUNTER — Telehealth: Payer: Self-pay

## 2023-11-18 ENCOUNTER — Encounter: Payer: Self-pay | Admitting: Physical Medicine & Rehabilitation

## 2023-11-18 NOTE — Telephone Encounter (Signed)
 [facility] pharmacy called asking is they can fill the Oxy/APAP 10/325 mg because patient fill Oxy/APAP 7.5/325 mg  on 8/14?

## 2023-11-22 ENCOUNTER — Encounter: Payer: Self-pay | Admitting: Internal Medicine

## 2023-11-22 ENCOUNTER — Ambulatory Visit (INDEPENDENT_AMBULATORY_CARE_PROVIDER_SITE_OTHER): Admitting: Internal Medicine

## 2023-11-22 VITALS — BP 136/80 | HR 97 | Ht 63.0 in | Wt 246.0 lb

## 2023-11-22 DIAGNOSIS — Z794 Long term (current) use of insulin: Secondary | ICD-10-CM

## 2023-11-22 DIAGNOSIS — E1165 Type 2 diabetes mellitus with hyperglycemia: Secondary | ICD-10-CM | POA: Diagnosis not present

## 2023-11-22 DIAGNOSIS — E1142 Type 2 diabetes mellitus with diabetic polyneuropathy: Secondary | ICD-10-CM

## 2023-11-22 DIAGNOSIS — E785 Hyperlipidemia, unspecified: Secondary | ICD-10-CM | POA: Diagnosis not present

## 2023-11-22 DIAGNOSIS — E1159 Type 2 diabetes mellitus with other circulatory complications: Secondary | ICD-10-CM | POA: Diagnosis not present

## 2023-11-22 DIAGNOSIS — N1832 Chronic kidney disease, stage 3b: Secondary | ICD-10-CM

## 2023-11-22 DIAGNOSIS — E1122 Type 2 diabetes mellitus with diabetic chronic kidney disease: Secondary | ICD-10-CM

## 2023-11-22 DIAGNOSIS — E0590 Thyrotoxicosis, unspecified without thyrotoxic crisis or storm: Secondary | ICD-10-CM

## 2023-11-22 DIAGNOSIS — E042 Nontoxic multinodular goiter: Secondary | ICD-10-CM

## 2023-11-22 LAB — POCT GLYCOSYLATED HEMOGLOBIN (HGB A1C): Hemoglobin A1C: 7.9 % — AB (ref 4.0–5.6)

## 2023-11-22 MED ORDER — LOSARTAN POTASSIUM 25 MG PO TABS: 25.0000 mg | ORAL_TABLET | Freq: Every day | ORAL | 1 refills | Status: AC

## 2023-11-22 MED ORDER — ROSUVASTATIN CALCIUM 20 MG PO TABS: 20.0000 mg | ORAL_TABLET | Freq: Every day | ORAL | 0 refills | Status: AC

## 2023-11-22 MED ORDER — FENOFIBRATE 145 MG PO TABS: 145.0000 mg | ORAL_TABLET | Freq: Every day | ORAL | 0 refills | Status: AC

## 2023-11-22 NOTE — Patient Instructions (Addendum)
 A1c 7.9% !!  Keep up the good work   Continue to enter 20 g of carbohydrates with each meal and 10 g of carbohydrates with snacks   -HOW TO TREAT LOW BLOOD SUGARS (Blood sugar LESS THAN 70 MG/DL) Please follow the RULE OF 15 for the treatment of hypoglycemia treatment (when your (blood sugars are less than 70 mg/dL)   STEP 1: Take 15 grams of carbohydrates when your blood sugar is low, which includes:  3-4 GLUCOSE TABS  OR 3-4 OZ OF JUICE OR REGULAR SODA OR ONE TUBE OF GLUCOSE GEL    STEP 2: RECHECK blood sugar in 15 MINUTES STEP 3: If your blood sugar is still low at the 15 minute recheck --> then, go back to STEP 1 and treat AGAIN with another 15 grams of carbohydrates.

## 2023-11-22 NOTE — Progress Notes (Signed)
 Name: [name]  Age/ Sex: 63 y.o., female   MRN/ DOB: [id number], 08-Nov-1960     PCP: Bulah Alm RAMAN, PA-C   Reason for Endocrinology Evaluation: Type 2 Diabetes Mellitus  Initial Endocrine Consultative Visit: 07/06/2020    PATIENT IDENTIFIER: [name] is a 63 y.o. female with a past medical history of SVT, MNG, T2DM and Dyslipidemia . The patient has followed with Endocrinology clinic since 07/06/2020 for consultative assistance with management of her diabetes.  DIABETIC HISTORY:  [name] was diagnosed with DM in 2011 ,Ozempic - severe nausea . Insulin  started in 2015. Metformin - no intolerance , jardiance - no intolerance, soliqua . Her hemoglobin A1c has ranged from 6.5%  in 2016, peaking at 13.6% in 2021  THYROID  HISTROY : She was found to have hypermetabolic  MNG on PET scan in 2016 during evaluation of ovarian cyst. These results were confirmed with thyroid  ultrasound in 2017. She is S/P FNA of the isthmic and right lobe nodule with scant cellularity in 2017. She was lost to follow up until 06/2020, repeat ultrasound showed heterogenous gland but no specific nodules  Reduced Metformin  due to low GFR 01/2021  Aldo: renin normal 09/2020  Started Jardiance  09/2021 but this was discontinued by 07/2022 due to recurrent yeast infections  Metformin  discontinued 07/2022 due to a GFR 31  Mounjaro   2.5 mg caused GI side effects 09/2023  Patient was trained on OmniPod July, 2025   SUBJECTIVE:   During the last visit (06/04/2023): A1c 10.2%     Today (11/22/2023): [name] is here for a F/U on diabetes and MNG.  She has been checking glucose multiple times daily through Dexcom.  Rare episodes of hypoglycemia during the day were noted.  No nausea or vomiting  No constipation or diarrhea - on laxative  No  palpitation  No local neck swelling  Has noted chest tightness with exertion and uses albuterol  as needed which relieves the symptoms  NO tremors      This patient with type 2 diabetes is treated with OmniPod (insulin  pump). During the visit the pump basal and bolus doses were reviewed including carb/insulin  rations and supplemental doses. The clinical list was updated. The glucose meter download was reviewed in detail to determine if the current pump settings are providing the best glycemic control without excessive hypoglycemia.  Pump and meter download:    Pump   OmniPod Settings   Insulin  type   Fiasp    Basal rate       0000 2.5 u/h               I:C ratio       0000 1:1    # 20 g     # 10 g with snack           Sensitivity       0000  25      Goal       0000  120          Type & Model of Pump: OmniPod Insulin  Type: Currently using Fiasp .  Body mass index is 43.58 kg/m.  PUMP STATISTICS: Average BG: 199 Average Daily Carbs (g): 41.8 Average Total Daily Insulin : 84.8 Average Daily Basal: 46.9 (55%) Average Daily Bolus: 37.9 (45%)     HOME DIABETES REGIMEN:  Fiasp  Losartan  25 mg daily  Fenofibrate  145 mg daily  Rosuvastatin  20 mg daily      Statin: yes ACE-I/ARB: no Prior Diabetic Education: yes    CONTINUOUS GLUCOSE MONITORING  RECORD INTERPRETATION    Dates of Recording: 8/30-9/02/2024  Sensor description: Freestyle Libre 3  Results statistics:   CGM use % of time 75.3  Average and SD 199/63  Time in range 44%  % Time Above 180 36  % Time above 250 20  % Time Below target 2   Glycemic patterns summary: BGs trend down overnight to optimal, but increased throughout the day  Hyperglycemic episodes postprandial  Hypoglycemic episodes occurred N/A  Overnight periods: Trends down    DIABETIC COMPLICATIONS: Microvascular complications:  Neuropathy Denies: CKD, retinopathy Last Eye Exam: Completed 08/2021  Macrovascular complications:  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 Alvera   Diabetes mellitus without complication (HCC)    Diverticulosis    Elevated alkaline phosphatase level    Fatty liver    Gallstones    Hiatal hernia    Hyperlipidemia    Hypertension    Internal hemorrhoids    Sleep apnea    2008- sleep study, neg. for sleep apnea    Stroke [facility])    SVT (supraventricular tachycardia) (HCC)    Symptomatic cholelithiasis 09/11/2018   Past Surgical History:  Past Surgical History:  Procedure Laterality Date   ANAL EXAMINATION UNDER ANESTHESIA  02/21/2011   anal fistula   BREAST EXCISIONAL BIOPSY Left    BREAST SURGERY  patient does not remember date of procedure   pull fluid off lft br   CHOLECYSTECTOMY N/A 09/12/2018   Procedure: LAPAROSCOPIC CHOLECYSTECTOMY;  Surgeon: Aron Shoulders, MD;  Location: [facility] OR;  Service: General;  Laterality: N/A;   COLONOSCOPY  10/08/2016   2 day prep 1 TA,   ELECTROPHYSIOLOGIC STUDY N/A 05/05/2015   Procedure: SVT Ablation;  Surgeon: Will Gladis Norton, MD;  Location: [facility] INVASIVE CV LAB;  Service: Cardiovascular;  Laterality: N/A;   EP IMPLANTABLE DEVICE N/A 01/30/2016   Procedure: Loop Recorder Insertion;  Surgeon: Jerel Balding, MD;  Location: [facility] INVASIVE CV LAB;  Service: Cardiovascular;  Laterality: N/A;   INCISE AND DRAIN ABCESS     abscess on right thigh and buttock   KNEE ARTHROSCOPY     left   LAPAROSCOPIC APPENDECTOMY N/A 09/19/2018   Procedure: APPENDECTOMY LAPAROSCOPIC;  Surgeon: Signe Mitzie LABOR, MD;  Location: [facility] OR;  Service: General;  Laterality: N/A;   SHOULDER SURGERY  04/14/2009   right   Social History:  reports that she has never smoked. She has never used smokeless tobacco. She reports that she does not drink alcohol and does not use drugs. Family History:  Family History  Problem Relation Age of Onset    Hypertension Mother    Alzheimer's disease Mother    Diabetes Father    Breast cancer Sister 33   Anesthesia problems Neg Hx    Hypotension Neg Hx    Malignant hyperthermia Neg Hx    Pseudochol deficiency Neg Hx    Colon cancer Neg Hx    Esophageal cancer Neg Hx    Stomach cancer Neg Hx    Rectal cancer Neg Hx    Thyroid  disease Neg Hx      HOME MEDICATIONS: Allergies as of 11/22/2023       Reactions  Jardiance  [empagliflozin ] Other (See Comments)   Recurrent yeast infections    Mounjaro  [tirzepatide ] Other (See Comments)   Stomach cramps per patient    Ozempic  (0.25 Or 0.5 Mg-dose) [semaglutide (0.25 Or 0.5mg -dos)]    Severe nausea, bad gas   Tramadol  Nausea Only        Medication List        Accurate as of November 22, 2023  3:31 PM. If you have any questions, ask your nurse or doctor.          albuterol  108 (90 Base) MCG/ACT inhaler Commonly known as: VENTOLIN  HFA Inhale 2 puffs into the lungs every 6 (six) hours as needed for wheezing or shortness of breath.   aspirin  EC 81 MG tablet Take 1 tablet (81 mg total) by mouth daily.   budesonide -formoterol  160-4.5 MCG/ACT inhaler Commonly known as: Symbicort  Inhale 2 puffs into the lungs 2 (two) times daily.   calcium  carbonate 600 MG Tabs tablet Commonly known as: Calcium  600 Take 1 tablet (600 mg total) by mouth 2 (two) times daily with a meal.   Dexcom G7 Sensor Misc 1 Device by Does not apply route as directed.   DULoxetine  60 MG capsule Commonly known as: CYMBALTA  Take 1 capsule by mouth twice daily   fenofibrate  145 MG tablet Commonly known as: TRICOR  Take 1 tablet by mouth once daily   ferrous gluconate  324 MG tablet Commonly known as: FERGON Take 1 tablet (324 mg total) by mouth daily with breakfast.   Fiasp  100 UNIT/ML Soln Generic drug: Insulin  Aspart (w/Niacinamide) Increase  Fiasp  40 units with each meal  Fiasp  correctional insulin : ADD extra units on insulin  to your meal-time Fiasp   dose if your blood sugars are higher than 150. Use the scale below to help guide you. Max daily 100 units   furosemide  80 MG tablet Commonly known as: LASIX  Take 1 tablet by mouth once daily   Gvoke HypoPen  2-Pack 0.5 MG/0.1ML Soaj Generic drug: Glucagon  Inject 1 Dose into the skin daily as needed.   losartan  25 MG tablet Commonly known as: COZAAR  Take 1 tablet by mouth once daily   methocarbamol  500 MG tablet Commonly known as: ROBAXIN  TAKE 1 TABLET BY MOUTH EVERY 8 HOURS AS NEEDED FOR MUSCLE SPASM   metoprolol  succinate 25 MG 24 hr tablet Commonly known as: TOPROL -XL Take 1 tablet by mouth once daily   naloxone 4 MG/0.1ML Liqd nasal spray kit Commonly known as: NARCAN   nortriptyline  75 MG capsule Commonly known as: PAMELOR  Take 1 capsule by mouth at bedtime   omeprazole  20 MG capsule Commonly known as: PRILOSEC  Take 1 capsule by mouth once daily   Omnipod 5 G7 Pods (Gen 5) Misc 1 Device by Does not apply route every other day.   Omnipod 5 G7 Intro (Gen 5) Kit 1 Device by Does not apply route every other day.   oxyCODONE -acetaminophen  10-325 MG tablet Commonly known as: Percocet Take 1 tablet by mouth 5 (five) times daily.   Pen Needles 32G X 6 MM Misc Use as instructed to inject insulin  in the morning, at noon, in the evening, and at bedtime   potassium chloride  10 MEQ tablet Commonly known as: KLOR-CON  Take 1 tablet (10 mEq total) by mouth 2 (two) times daily.   pregabalin  50 MG capsule Commonly known as: LYRICA  Take 1 capsule (50 mg total) by mouth 2 (two) times daily.   rosuvastatin  20 MG tablet Commonly known as: CRESTOR  Take 1 tablet by mouth once daily  spironolactone  25 MG tablet Commonly known as: Aldactone  Take 0.5 tablets (12.5 mg total) by mouth daily.   sucralfate  1 g tablet Commonly known as: Carafate  Take 1 tablet (1 g total) by mouth 4 (four) times daily.   Vitamin D  (Ergocalciferol ) 1.25 MG (50000 UNIT) Caps capsule Commonly known  as: DRISDOL  Take 1 capsule (50,000 Units total) by mouth every 7 (seven) days.         OBJECTIVE:   Vital Signs: BP 136/80 (BP Location: Left Arm, Patient Position: Sitting, Cuff Size: Normal)   Pulse 97   Ht 5' 3 (1.6 m)   Wt 246 lb (111.6 kg)   LMP 02/09/2017   SpO2 99%   BMI 43.58 kg/m   Wt Readings from Last 3 Encounters:  11/22/23 246 lb (111.6 kg)  11/06/23 246 lb 12.8 oz (111.9 kg)  10/17/23 240 lb 9.6 oz (109.1 kg)     Exam: General: Pt appears well and is in NAD Bilateral thyroid  nodules were appreciated on today's exam  Lungs: Clear with good BS bilat  Heart: RRR   Extremities: Trace pretibial edema.   Neuro: MS is good with appropriate affect, pt is alert and Ox3      DM foot exam: 06/04/2023   The skin of the feet is  without sores or ulcerations. The pedal pulses are 1+ on right and 1+ on left. The sensation is intact to a screening 5.07, 10 gram monofilament bilaterally     DATA REVIEWED:  Lab Results  Component Value Date   HGBA1C 7.9 (A) 11/22/2023   HGBA1C 9.8 (A) 09/04/2023   HGBA1C 10.2 (A) 06/04/2023    Latest Reference Range & Units 10/07/23 08:53  Sodium 134 - 144 mmol/L 139  Potassium 3.5 - 5.2 mmol/L 4.2  Chloride 96 - 106 mmol/L 102  CO2 20 - 29 mmol/L 22  Glucose 70 - 99 mg/dL 878 (H)  BUN 8 - 27 mg/dL 25  Creatinine 9.42 - 8.99 mg/dL 8.38 (H)  Calcium  8.7 - 10.3 mg/dL 89.2 (H)  BUN/Creatinine Ratio 12 - 28  16  eGFR >59 mL/min/1.73 36 (L)  Alkaline Phosphatase 44 - 121 IU/L 67  Albumin 3.9 - 4.9 g/dL 4.2  AST 0 - 40 IU/L 14  ALT 0 - 32 IU/L 17  Total Protein 6.0 - 8.5 g/dL 6.3  Total Bilirubin 0.0 - 1.2 mg/dL 0.3     Latest Reference Range & Units 10/07/23 08:53  Total CHOL/HDL Ratio 0.0 - 4.4 ratio 4.3  Cholesterol, Total 100 - 199 mg/dL 846  HDL Cholesterol >60 mg/dL 36 (L)  Triglycerides 0 - 149 mg/dL 862  VLDL Cholesterol Cal 5 - 40 mg/dL 24  LDL Chol Calc (NIH) 0 - 99 mg/dL 93    Latest Reference Range &  Units 10/07/23 08:53  TSH 0.450 - 4.500 uIU/mL 0.284 (L)  T4,Free(Direct) 0.82 - 1.77 ng/dL 9.00  (L): Data is abnormally low   Latest Reference Range & Units 09/04/23 14:18  Microalb, Ur mg/dL 1.4  MICROALB/CREAT RATIO <30 mg/g creat 20  Creatinine, Urine 20 - 275 mg/dL 69     THYROID  ultrasound 09/21/2022  FINDINGS: Parenchymal Echotexture: Markedly heterogenous   Isthmus: 2.1 cm thickness, previously 3.1   Right lobe: 8 x 4.8 x 5 cm, previously 8.1 x 4.2 x 4.5   Left lobe: 7.2 x 5.5 cm, previously 7 x 3.7 x 3.9   _________________________________________________________   Estimated total number of nodules >/= 1 cm: 0   Number of spongiform  nodules >/=  2 cm not described below (TR1): 0   Number of mixed cystic and solid nodules >/= 1.5 cm not described below (TR2): 0   _________________________________________________________   Heterogenous lobular parenchyma.   0.8 cm hypoechoic nodule without calcifications, superior left; This nodule does NOT meet TI-RADS criteria for biopsy or dedicated follow-up.   No other discrete lesions.  No regional cervical adenopathy.   IMPRESSION: Enlarged heterogenous thyroid  with no suspicious nodules. No recommendation for biopsy or follow-up.   The above is in keeping with the ACR TI-RADS recommendations [facility] [place] Radiol 2017;14:587-595    FNA isthmic nodule 1.8 cm  03/31/2015 Scant epithelium     FNA right lower nodule 4.8 cm 03/31/2015 Scant epithelium     ASSESSMENT / PLAN / RECOMMENDATIONS:   1) Type 2 Diabetes Mellitus, Sub-Optimally controlled, With Neuropathic, CKD III  and macrovascular complications - Most recent A1c of 7.9 %. Goal A1c <7.0%.     - A1c has trended down from 9.8% to 7.9% -Intolerant to Jardiance  due to recurrent yeast infections -Intolerant to Ozempic  and Mounjaro  2.5 mg dose -We stopped  metformin  due to GFR <35 -Not a candidate for pioglitazone due to lower extremity edema - Patient  encouraged to continue to enter 20 g of carbohydrates with meal and 10 g with snacks - I will change her sensitivity factor from 25 to 20  MEDICATIONS:   Fiasp      Pump   OmniPod Settings   Insulin  type   Fiasp    Basal rate       0000 2.5 u/h               I:C ratio       0000 1:1    # 20 g     # 10 g with snack           Sensitivity       0000  20      Goal       0000  120         EDUCATION / INSTRUCTIONS: BG monitoring instructions: Patient is instructed to check her blood sugars 3 times a day, before meals. Call [facility] if: BG persistently < 70  I reviewed the Rule of 15 for the treatment of hypoglycemia in detail with the patient. Literature supplied.   2) Diabetic complications:  Eye: Does not have known diabetic retinopathy.  Neuro/ Feet: Does  have known diabetic peripheral neuropathy .  Renal: Patient does not have known baseline CKD. She   is  on an ACEI/ARB at present.    3) MNG:    -No local neck symptoms - She is S/P FNA of the isthmic and right nodule in 2017 with scant cellularity , she was lost to follow up until 07/2020 when a repeat Thyroid  ultrasound showed increase thyroid  heterogeneity but no actual thyroid  nodules, previously biopsied nodules have blended in with a heterogeneous background - Her last ultrasound was in 2024 and no follow-up was recommended at the time   4) Subclinical Hyperthyroidism:  -Patient is asymptomatic -TSH continues to trend down, we have opted to hold off on any treatment unless her levels decreased to 0.1 u IU/mL or becomes symptomatic   5) CKD III:  -GFR has been trending down -I started her on ARB in 2024  Medication Continue losartan  25 mg daily  6) Dyslipidemia :   - Lipid panel acceptable 09/2023 - Patient was not sure if she  is on fenofibrate , I did emphasize the importance of Taking sure she is taking her medications as prescribed   Medication  Continue rosuvastatin  20  mg daily Continue fenofibrate  145 mg daily     F/U in 4 months   Signed electronically by: Stefano Redgie Butts, MD  [facility]  [facility] [address] [place], [place] [address] Phone: [phone] FAX: [phone]   CC: Bulah Alm RAMAN, PA-C [address] [place] [address] Phone: [phone]  Fax: [phone]  Return to Endocrinology clinic as below: Future Appointments  Date Time Provider Department Center  11/24/2023 12:00 PM GI-315 MR 3 [id number] [facility]  12/17/2023  3:20 PM Debby Fidela CROME, NP [facility] CPR  03/31/2024  3:00 PM Orine Goga, Donell Redgie, MD [facility] None  10/19/2024  8:15 AM Tysinger, Alm RAMAN, PA-C [facility] [phone] [place]

## 2023-11-24 ENCOUNTER — Ambulatory Visit
Admission: RE | Admit: 2023-11-24 | Discharge: 2023-11-24 | Disposition: A | Discharge status: Home / Self Care | Source: Ambulatory Visit | Attending: Physical Medicine & Rehabilitation | Admitting: Physical Medicine & Rehabilitation

## 2023-11-24 DIAGNOSIS — M47817 Spondylosis without myelopathy or radiculopathy, lumbosacral region: Secondary | ICD-10-CM

## 2023-11-24 DIAGNOSIS — G894 Chronic pain syndrome: Secondary | ICD-10-CM

## 2023-11-24 DIAGNOSIS — M5416 Radiculopathy, lumbar region: Secondary | ICD-10-CM

## 2023-11-25 ENCOUNTER — Encounter: Payer: Self-pay | Admitting: Internal Medicine

## 2023-11-28 ENCOUNTER — Telehealth: Payer: Self-pay | Admitting: Physical Medicine & Rehabilitation

## 2023-11-28 ENCOUNTER — Other Ambulatory Visit: Payer: Self-pay | Admitting: Physical Medicine & Rehabilitation

## 2023-11-28 DIAGNOSIS — M47816 Spondylosis without myelopathy or radiculopathy, lumbar region: Secondary | ICD-10-CM

## 2023-11-28 DIAGNOSIS — G894 Chronic pain syndrome: Secondary | ICD-10-CM

## 2023-11-28 DIAGNOSIS — M629 Disorder of muscle, unspecified: Secondary | ICD-10-CM

## 2023-11-28 NOTE — Telephone Encounter (Signed)
 Patient returned your phone call about MRI results--best time to call her is between 8-9 or 4:30 and after.

## 2023-11-29 ENCOUNTER — Ambulatory Visit: Payer: Self-pay | Admitting: Physical Medicine & Rehabilitation

## 2023-11-29 NOTE — Telephone Encounter (Signed)
 I spoke to [name] this morning about her MRI. She has issues at L4-5 and L5-S1.  SHe was unable to tolerate lyrica  due to sedation. She is taking extra tylenol  which seems to be helping somewhat. She still is having left leg symptoms however. [name] has tried numerous medications and has previously had physical therapy.  Based on her MRI findings and ongoing symptoms, I'm recommending a left L5-S1 transforaminal ESI per Dr. Carilyn. Pt was in agreement.   Thanks![name]

## 2023-12-06 ENCOUNTER — Telehealth: Payer: Self-pay | Admitting: Dietician

## 2023-12-06 ENCOUNTER — Encounter: Attending: Internal Medicine | Admitting: Dietician

## 2023-12-06 DIAGNOSIS — Z794 Long term (current) use of insulin: Secondary | ICD-10-CM | POA: Insufficient documentation

## 2023-12-06 DIAGNOSIS — E1165 Type 2 diabetes mellitus with hyperglycemia: Secondary | ICD-10-CM | POA: Insufficient documentation

## 2023-12-06 NOTE — Progress Notes (Addendum)
 Patient is here today as her Omnipod 5 PDM required her to enter her user name and password this am and now settings are wrong. Confirmed that her basal rate is incorrect (currently set at 30 units/hour). Confirmed that her correct above number is incorrect (currently set at correct above 200) Confirmed that her ICR is incorrect (30-150).  Called Omnipod tech support. They instructed her to do a complete reset of the PDM and reprogram the PDM. Patient removed her POD and PDM was reset. Her sensor reading is now 189.  Her pump was reprogrammed with the following settings (per her last MD visit on 11/12/2023) Max basal:  5units/hr Basal rate:  2.5 units/hr (MN-MN) ICR 1:1 and patient enters 20 for a meal and 10 for a snack ISF 1:20 Target 120, Correct above 120 Max bolus 30  Patient instructed to call Omnipod Tech support if she notices any other problems related to her pump programing or safety. She was instructed to remove the POD if she notices similar problems to today and call tech support prior to using PDM. She has [employer] support number. She states that she does have long acting insulin  in the event that she needs to do MDI. She was informed to closely monitor her blood glucose.  Patient with no further questions.  Time of appointment [phone].  Leita Constable, RD, LDN, CDCES, DipACLM

## 2023-12-06 NOTE — Telephone Encounter (Signed)
 Returned patient call. She is afraid that her Omnipod PDM has lost its settings.  I was unable to problem solve over the phone.  She will tome to the office later today.  Leita Constable, RD, LDN, CDCES, DipACLM

## 2023-12-17 ENCOUNTER — Encounter: Attending: Registered Nurse | Admitting: Registered Nurse

## 2023-12-17 ENCOUNTER — Encounter: Payer: Self-pay | Admitting: Registered Nurse

## 2023-12-17 VITALS — BP 105/62 | HR 75 | Ht 63.0 in | Wt 249.8 lb

## 2023-12-17 DIAGNOSIS — Z79891 Long term (current) use of opiate analgesic: Secondary | ICD-10-CM | POA: Diagnosis present

## 2023-12-17 DIAGNOSIS — M47817 Spondylosis without myelopathy or radiculopathy, lumbosacral region: Secondary | ICD-10-CM | POA: Insufficient documentation

## 2023-12-17 DIAGNOSIS — M5416 Radiculopathy, lumbar region: Secondary | ICD-10-CM | POA: Diagnosis not present

## 2023-12-17 DIAGNOSIS — G894 Chronic pain syndrome: Secondary | ICD-10-CM | POA: Insufficient documentation

## 2023-12-17 DIAGNOSIS — Z5181 Encounter for therapeutic drug level monitoring: Secondary | ICD-10-CM | POA: Diagnosis not present

## 2023-12-17 MED ORDER — OXYCODONE-ACETAMINOPHEN 10-325 MG PO TABS: 1.0000 | ORAL_TABLET | Freq: Every day | ORAL | 0 refills | Status: DC

## 2023-12-17 MED ORDER — OXYCODONE-ACETAMINOPHEN 10-325 MG PO TABS
1.0000 | ORAL_TABLET | Freq: Every day | ORAL | 0 refills | Status: AC
Start: 2023-12-17 — End: ?

## 2023-12-17 NOTE — Progress Notes (Unsigned)
 Subjective:    Patient ID: [name], female    DOB: Mar 14, 1960, 63 y.o.   MRN: [id number]  HPI: [name] is a 63 y.o. female who returns for follow up appointment for chronic pain and medication refill. states *** pain is located in  ***. rates pain ***. current exercise regime is walking and performing stretching exercises.  [name] Morphine  equivalent is 75.00 MME.   Oral swab was performed today    Pain Inventory Average Pain 9 Pain Right Now 8 My pain is sharp, stabbing, and aching  In the last 24 hours, has pain interfered with the following? General activity 10 Relation with others 10 Enjoyment of life 10 What TIME of day is your pain at its worst? morning  and daytime Sleep (in general) Poor  Pain is worse with: walking, standing, and some activites Pain improves with: rest, heat/ice, and medication Relief from Meds: 8  Family History  Problem Relation Age of Onset   Hypertension Mother    Alzheimer's disease Mother    Diabetes Father    Breast cancer Sister 65   Anesthesia problems Neg Hx    Hypotension Neg Hx    Malignant hyperthermia Neg Hx    Pseudochol deficiency Neg Hx    Colon cancer Neg Hx    Esophageal cancer Neg Hx    Stomach cancer Neg Hx    Rectal cancer Neg Hx    Thyroid  disease Neg Hx    Social History   Socioeconomic History   Marital status: Divorced    Spouse name: Not on file   Number of children: 0   Years of education: Not on file   Highest education level: Not on file  Occupational History   Occupation: TEACHER ASSISTANT  Tobacco Use   Smoking status: Never   Smokeless tobacco: Never  Vaping Use   Vaping status: Never Used  Substance and Sexual Activity   Alcohol use: No   Drug use: No   Sexual activity: Yes  Other Topics Concern   Not on file  Social History Narrative   Lives alone, walks for exercise, is a Runner, broadcasting/film/video.  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   Social Drivers of Research scientist (physical sciences) Strain: Not on file  Food Insecurity: Food Insecurity Present (09/03/2019)   Hunger Vital Sign    Worried About Running Out of Food in the Last Year: Sometimes true    Ran Out of Food in the Last Year: Sometimes true  Transportation Needs: No Transportation Needs (09/03/2019)   PRAPARE - Administrator, Civil Service (Medical): No    Lack of Transportation (Non-Medical): No  Physical Activity: Not on file  Stress: Not on file  Social Connections: Not on file   Past Surgical History:  Procedure Laterality Date   ANAL EXAMINATION UNDER ANESTHESIA  02/21/2011   anal fistula   BREAST EXCISIONAL BIOPSY Left    BREAST SURGERY  patient does not remember date of procedure   pull fluid off lft br   CHOLECYSTECTOMY N/A 09/12/2018   Procedure: LAPAROSCOPIC CHOLECYSTECTOMY;  Surgeon: Aron Shoulders, MD;  Location: [facility] OR;  Service: General;  Laterality: N/A;   COLONOSCOPY  10/08/2016   2 day prep 1 TA,   ELECTROPHYSIOLOGIC STUDY N/A 05/05/2015   Procedure: SVT Ablation;  Surgeon: Will Gladis Norton, MD;  Location: [facility] INVASIVE CV LAB;  Service: Cardiovascular;  Laterality: N/A;   EP IMPLANTABLE DEVICE N/A 01/30/2016   Procedure: Loop Recorder Insertion;  Surgeon: Jerel Balding, MD;  Location: [facility] INVASIVE CV LAB;  Service: Cardiovascular;  Laterality: N/A;   INCISE AND DRAIN ABCESS     abscess on right thigh and buttock   KNEE ARTHROSCOPY     left   LAPAROSCOPIC APPENDECTOMY N/A 09/19/2018   Procedure: APPENDECTOMY LAPAROSCOPIC;  Surgeon: Signe Mitzie LABOR, MD;  Location: [facility] OR;  Service: General;  Laterality: N/A;   SHOULDER SURGERY  04/14/2009   right   Past Surgical History:  Procedure Laterality Date   ANAL EXAMINATION UNDER ANESTHESIA  02/21/2011   anal fistula   BREAST EXCISIONAL BIOPSY Left    BREAST SURGERY  patient does not remember date of procedure   pull fluid off lft br   CHOLECYSTECTOMY N/A 09/12/2018   Procedure: LAPAROSCOPIC CHOLECYSTECTOMY;  Surgeon:  Aron Shoulders, MD;  Location: [facility] OR;  Service: General;  Laterality: N/A;   COLONOSCOPY  10/08/2016   2 day prep 1 TA,   ELECTROPHYSIOLOGIC STUDY N/A 05/05/2015   Procedure: SVT Ablation;  Surgeon: Will Gladis Norton, MD;  Location: [facility] INVAS[facility] CV [facility];  Service: Cardiovascular;  Laterality: N/A;   EP IMPLANTABLE DEVICE N/A 01/30/2016   Procedure: Loop Recorder Insertion;  Surgeon: Jerel Balding, MD;  Location: [facility] INVAS[facility] CV [facility];  Service: Cardiovascular;  Laterality: N/A;   INCISE AND DRAIN ABCESS     abscess on right thigh and buttock   KNEE ARTHROSCOPY     left   LAPAROSCOPIC APPENDECTOMY N/A 09/19/2018   Procedure: APPENDECTOMY LAPAROSCOPIC;  Surgeon: Signe Mitzie LABOR, MD;  Location: [facility] OR;  Service: General;  Laterality: N/A;   SHOULDER SURGERY  04/14/2009   right   Past Medical History:  Diagnosis Date   Abscess    increased drainage from abscess on buttock   Anal fistula    Anxiety    Bilateral hip pain 05/27/2015   Chronic pain syndrome 05/27/2015   Depression    sees Dr. Alvera   Diabetes mellitus without complication [facility])    Diverticulosis    Elevated alkaline phosphatase level    Fatty liver    Gallstones    Hiatal hernia    Hyperlipidemia    Hypertension    Internal hemorrhoids    Sleep apnea    2008- sleep study, neg. for sleep apnea    Stroke [facility])    SVT (supraventricular tachycardia)    Symptomatic cholelithiasis 09/11/2018   BP 105/62 (BP Location: Left Arm, Patient Position: Sitting, Cuff Size: Normal)   Pulse 75   Ht 5' 3 (1.6 m)   Wt 249 lb 12.8 oz (113.3 kg)   LMP 02/09/2017   SpO2 97%   BMI 44.25 kg/m   Opioid Risk Score:   Fall Risk Score:  `1  Depression screen [facility] 2/9     11/06/2023    3:43 PM 10/17/2023    2:52 PM 10/07/2023    8:18 AM 08/07/2023    3:27 PM 07/16/2023    3:31 PM 05/08/2023    3:26 PM 03/21/2023    3:07 PM  Depression screen PHQ 2/9  Decreased Interest 0 0 0 0 0 0 0  Down, Depressed, Hopeless 0 0 0 0 0 0 0  PHQ - 2  Score 0 0 0 0 0 0 0      Review of Systems  Musculoskeletal:  Positive for arthralgias, back pain and myalgias.       Low back pain radiating into bilateral legs  All other systems reviewed and are negative.  Objective:   Physical Exam        Assessment & Plan:

## 2023-12-20 LAB — DRUG TOX ALC METAB W/CON, ORAL FLD: Alcohol Metabolite: NEGATIVE ng/mL (ref <25)

## 2023-12-20 LAB — DRUG TOX MONITOR 1 W/CONF, ORAL FLD
Amphetamines: NEGATIVE ng/mL (ref <10)
Barbiturates: NEGATIVE ng/mL (ref <10)
Benzodiazepines: NEGATIVE ng/mL (ref <0.50)
Buprenorphine: NEGATIVE ng/mL (ref <0.10)
Cocaine: NEGATIVE ng/mL (ref <5.0)
Codeine: NEGATIVE ng/mL (ref <2.5)
Dihydrocodeine: NEGATIVE ng/mL (ref <2.5)
Fentanyl: NEGATIVE ng/mL (ref <0.10)
Heroin Metabolite: NEGATIVE ng/mL (ref <1.0)
Hydrocodone: NEGATIVE ng/mL (ref <2.5)
Hydromorphone: NEGATIVE ng/mL (ref <2.5)
MARIJUANA: NEGATIVE ng/mL (ref <2.5)
MDMA: NEGATIVE ng/mL (ref <10)
Meprobamate: NEGATIVE ng/mL (ref <2.5)
Methadone: NEGATIVE ng/mL (ref <5.0)
Morphine: NEGATIVE ng/mL (ref <2.5)
Nicotine Metabolite: NEGATIVE ng/mL (ref <5.0)
Norhydrocodone: NEGATIVE ng/mL (ref <2.5)
Noroxycodone: 39.2 ng/mL — ABNORMAL HIGH (ref <2.5)
Opiates: POSITIVE ng/mL — AB (ref <2.5)
Oxycodone: 93.7 ng/mL — ABNORMAL HIGH (ref <2.5)
Oxymorphone: NEGATIVE ng/mL (ref <2.5)
Phencyclidine: NEGATIVE ng/mL (ref <10)
Tapentadol: NEGATIVE ng/mL (ref <5.0)
Tramadol: NEGATIVE ng/mL (ref <5.0)
Zolpidem: NEGATIVE ng/mL (ref <5.0)

## 2023-12-24 ENCOUNTER — Encounter: Payer: Self-pay | Admitting: Physical Medicine & Rehabilitation

## 2023-12-24 ENCOUNTER — Encounter: Admitting: Physical Medicine & Rehabilitation

## 2023-12-24 VITALS — BP 110/67 | HR 87 | Temp 98.4°F | Ht 63.0 in | Wt 251.0 lb

## 2023-12-24 DIAGNOSIS — G894 Chronic pain syndrome: Secondary | ICD-10-CM | POA: Diagnosis not present

## 2023-12-24 DIAGNOSIS — M5416 Radiculopathy, lumbar region: Secondary | ICD-10-CM

## 2023-12-24 MED ORDER — IOHEXOL 180 MG/ML  SOLN
2.0000 mL | Freq: Once | INTRAMUSCULAR | Status: AC
  Administered 2023-12-24: 2 mL

## 2023-12-24 MED ORDER — LIDOCAINE HCL (PF) 1 % IJ SOLN
2.0000 mL | Freq: Once | INTRAMUSCULAR | Status: AC
  Administered 2023-12-24: 2 mL

## 2023-12-24 MED ORDER — LIDOCAINE HCL 1 % IJ SOLN
1.0000 mL | Freq: Once | INTRAMUSCULAR | Status: AC
  Administered 2023-12-24: 1 mL

## 2023-12-24 MED ORDER — DEXAMETHASONE SOD PHOSPHATE PF 10 MG/ML IJ SOLN
10.0000 mg | Freq: Once | INTRAMUSCULAR | Status: AC
  Administered 2023-12-24: 10 mg

## 2023-12-24 NOTE — Progress Notes (Signed)
 Lumbar transforaminal epidural steroid injection under fluoroscopic guidance with contrast enhancement  Indication: Lumbosacral radiculitis is not relieved by medication management or other conservative care and interfering with self-care and mobility.  MRI LUMBAR SPINE WITHOUT CONTRAST   TECHNIQUE: Multiplanar, multisequence MR imaging of the lumbar spine was performed. No intravenous contrast was administered.   COMPARISON:  Prior MRI from 12/06/2014.   FINDINGS: Segmentation: Standard. Lowest well-formed disc space labeled the L5-S1 level.   Alignment: Moderate dextroscoliosis. 3 mm degenerative retrolisthesis of L4 on L5.   Vertebrae: Vertebral body height maintained without acute or chronic fracture. Bone marrow signal intensity within normal limits. Few benign hemangiomata noted. No worrisome osseous lesions. No abnormal marrow edema.   Conus medullaris and cauda equina: Conus extends to the T12-L1 level. Conus and cauda equina appear normal.   Paraspinal and other soft tissues: Paraspinous soft tissues within normal limits. Few small T2 hyperintense cyst noted about the visualized kidneys, benign in appearance, no follow-up imaging recommended.   Disc levels:   L1-2: Negative interspace. Mild left greater than right facet spurring. No stenosis.   L2-3: Disc desiccation with mild disc bulge. Mild facet hypertrophy. No spinal stenosis. Foramina remain patent.   L3-4: Mild left far lateral disc bulging. Mild facet hypertrophy. No spinal stenosis. Foramina remain patent.   L4-5: Degenerative disc space narrowing with retrolisthesis. Disc desiccation with diffuse disc bulge. Associated reactive endplate change with marginal endplate osteophytic spurring. Superimposed left subarticular disc extrusion with inferior migration (series 113, image 33). Disc material closely approximates the descending left L5 nerve root. Mild to moderate facet hypertrophy. Resultant mild  left lateral recess stenosis. Central canal remains patent. There is an additional right foraminal to extraforaminal disc protrusion contacting the exiting right L4 nerve root (series 105, image 6). Mild right L4 foraminal stenosis for left neural foramina remains patent.   L5-S1: Minimal disc bulge. There is a superimposed small left foraminal disc protrusion contacting the exiting left L5 nerve root (series 105, image 14). Advanced bilateral facet degeneration. No significant spinal stenosis. Moderate left L5 foraminal narrowing. Right neural foramen remains patent.   IMPRESSION: 1. Left subarticular disc extrusion with inferior migration at L4-5, potentially affecting the descending left L5 nerve root. 2. Small left foraminal disc protrusion at L5-S1, also potentially affecting the exiting left L5 nerve root. 3. Right foraminal to extraforaminal disc protrusion at L4-5, contacting the exiting right L4 nerve root. 4. Moderate to advanced lower lumbar facet arthrosis.     Electronically Signed   By: Morene Hoard M.D.   On: 11/26/2023 12:04     Informed consent was obtained after describing risk and benefits of the procedure with the patient, this includes bleeding, bruising, infection, paralysis and medication side effects.  The patient wishes to proceed and has given written consent.  Patient was placed in prone position.  The lumbar area was marked and prepped with Betadine.  It was entered with a 25-gauge 1-1/2 inch needle and one mL of 1% lidocaine  was injected into the skin and subcutaneous tissue.  Then a 22-gauge 5 inch spinal needle was inserted into the left L5-S1  intervertebral foramen under AP, lateral, and oblique view.  Once needle tip was within the foramen on lateral views and or exceeding 6 o clock position on the pedicle on AP view, Omnipaque  180 was injected x 2ml Then a solution containing one mL of 10 mg per mL dexamethasone  and 2 mL of 1% lidocaine  was  injected.  The patient tolerated  procedure well.  Post procedure instructions were given.  Please see post procedure form.

## 2023-12-24 NOTE — Patient Instructions (Signed)

## 2023-12-24 NOTE — Progress Notes (Signed)
  PROCEDURE RECORD [facility]   Name: [name] DOB:1960-07-19 MRN: [id number]  Date:12/24/2023  Physician: Prentice Compton, MD    Nurse/CMA: Kennyth RMA  Allergies:  Allergies  Allergen Reactions   Jardiance  [Empagliflozin ] Other (See Comments)    Recurrent yeast infections    Mounjaro  [Tirzepatide ] Other (See Comments)    Stomach cramps per patient    Ozempic  (0.25 Or 0.5 Mg-Dose) [Semaglutide (0.25 Or 0.5mg -Dos)]     Severe nausea, bad gas   Tramadol  Nausea Only    Consent Signed: Yes.    Is patient diabetic? Yes.    CBG today? 107  Pregnant: No. LMP: Patient's last menstrual period was 02/09/2017. (age 66-55)  Anticoagulants: no Anti-inflammatory: no Antibiotics: no  Procedure: left L5-S1 Trans ESI  Position: Prone Start Time: 1:01  End Time: 1:09  Fluoro Time: 26  RN/CMA PARKER RMA PARKER RMA    Time 1246 1:15    BP 110/62 130/68    Pulse 87 84    Respirations 12 12    O2 Sat 93 93    S/S 6 6    Pain Level 7 0/10     D/C home with uber, patient A & O X 3, D/C instructions reviewed, and sits independently.

## 2024-01-15 ENCOUNTER — Encounter: Payer: Self-pay | Admitting: Physical Medicine & Rehabilitation

## 2024-02-13 ENCOUNTER — Emergency Department (HOSPITAL_COMMUNITY)
Admission: EM | Admit: 2024-02-13 | Discharge: 2024-02-13 | Disposition: A | Discharge status: Home / Self Care | Attending: Emergency Medicine | Admitting: Emergency Medicine

## 2024-02-13 ENCOUNTER — Encounter (HOSPITAL_COMMUNITY): Payer: Self-pay

## 2024-02-13 ENCOUNTER — Other Ambulatory Visit: Payer: Self-pay

## 2024-02-13 ENCOUNTER — Emergency Department (HOSPITAL_COMMUNITY)

## 2024-02-13 DIAGNOSIS — R1013 Epigastric pain: Secondary | ICD-10-CM | POA: Diagnosis not present

## 2024-02-13 DIAGNOSIS — R509 Fever, unspecified: Secondary | ICD-10-CM | POA: Insufficient documentation

## 2024-02-13 DIAGNOSIS — R110 Nausea: Secondary | ICD-10-CM | POA: Insufficient documentation

## 2024-02-13 DIAGNOSIS — R1084 Generalized abdominal pain: Secondary | ICD-10-CM | POA: Diagnosis present

## 2024-02-13 LAB — URINALYSIS, W/ REFLEX TO CULTURE (INFECTION SUSPECTED)
Bilirubin Urine: NEGATIVE
Glucose, UA: 50 mg/dL — AB
Hgb urine dipstick: NEGATIVE
Ketones, ur: NEGATIVE mg/dL
Nitrite: NEGATIVE
Protein, ur: NEGATIVE mg/dL
Specific Gravity, Urine: 1.016 (ref 1.005–1.030)
pH: 5 (ref 5.0–8.0)

## 2024-02-13 LAB — I-STAT CHEM 8, ED
BUN: 22 mg/dL (ref 8–23)
Calcium, Ion: 1.21 mmol/L (ref 1.15–1.40)
Chloride: 107 mmol/L (ref 98–111)
Creatinine, Ser: 1.7 mg/dL — ABNORMAL HIGH (ref 0.44–1.00)
Glucose, Bld: 124 mg/dL — ABNORMAL HIGH (ref 70–99)
HCT: 33 % — ABNORMAL LOW (ref 36.0–46.0)
Hemoglobin: 11.2 g/dL — ABNORMAL LOW (ref 12.0–15.0)
Potassium: 3.6 mmol/L (ref 3.5–5.1)
Sodium: 139 mmol/L (ref 135–145)
TCO2: 23 mmol/L (ref 22–32)

## 2024-02-13 LAB — CBC WITH DIFFERENTIAL/PLATELET
Abs Immature Granulocytes: 0.08 K/uL — ABNORMAL HIGH (ref 0.00–0.07)
Basophils Absolute: 0.1 K/uL (ref 0.0–0.1)
Basophils Relative: 1 %
Eosinophils Absolute: 0.3 K/uL (ref 0.0–0.5)
Eosinophils Relative: 3 %
HCT: 33.8 % — ABNORMAL LOW (ref 36.0–46.0)
Hemoglobin: 10.4 g/dL — ABNORMAL LOW (ref 12.0–15.0)
Immature Granulocytes: 1 %
Lymphocytes Relative: 10 %
Lymphs Abs: 1.2 K/uL (ref 0.7–4.0)
MCH: 26.7 pg (ref 26.0–34.0)
MCHC: 30.8 g/dL (ref 30.0–36.0)
MCV: 86.9 fL (ref 80.0–100.0)
Monocytes Absolute: 0.9 K/uL (ref 0.1–1.0)
Monocytes Relative: 8 %
Neutro Abs: 8.8 K/uL — ABNORMAL HIGH (ref 1.7–7.7)
Neutrophils Relative %: 77 %
Platelets: 458 K/uL — ABNORMAL HIGH (ref 150–400)
RBC: 3.89 MIL/uL (ref 3.87–5.11)
RDW: 13.2 % (ref 11.5–15.5)
WBC: 11.3 K/uL — ABNORMAL HIGH (ref 4.0–10.5)
nRBC: 0 % (ref 0.0–0.2)

## 2024-02-13 LAB — COMPREHENSIVE METABOLIC PANEL WITH GFR
ALT: 33 U/L (ref 0–44)
AST: 31 U/L (ref 15–41)
Albumin: 3.7 g/dL (ref 3.5–5.0)
Alkaline Phosphatase: 83 U/L (ref 38–126)
Anion gap: 13 (ref 5–15)
BUN: 19 mg/dL (ref 8–23)
CO2: 22 mmol/L (ref 22–32)
Calcium: 10.2 mg/dL (ref 8.9–10.3)
Chloride: 103 mmol/L (ref 98–111)
Creatinine, Ser: 1.61 mg/dL — ABNORMAL HIGH (ref 0.44–1.00)
GFR, Estimated: 36 mL/min — ABNORMAL LOW (ref >60)
Glucose, Bld: 124 mg/dL — ABNORMAL HIGH (ref 70–99)
Potassium: 3.7 mmol/L (ref 3.5–5.1)
Sodium: 138 mmol/L (ref 135–145)
Total Bilirubin: 0.5 mg/dL (ref 0.0–1.2)
Total Protein: 6.9 g/dL (ref 6.5–8.1)

## 2024-02-13 LAB — LIPASE, BLOOD: Lipase: 18 U/L (ref 11–51)

## 2024-02-13 LAB — I-STAT CG4 LACTIC ACID, ED: Lactic Acid, Venous: 1.3 mmol/L (ref 0.5–1.9)

## 2024-02-13 MED ORDER — MORPHINE SULFATE (PF) 4 MG/ML IV SOLN
4.0000 mg | Freq: Once | INTRAVENOUS | Status: AC
  Administered 2024-02-13: 4 mg via INTRAVENOUS
  Filled 2024-02-13: qty 1

## 2024-02-13 MED ORDER — LIDOCAINE VISCOUS HCL 2 % MT SOLN
15.0000 mL | Freq: Once | OROMUCOSAL | Status: AC
  Administered 2024-02-13: 15 mL via OROMUCOSAL
  Filled 2024-02-13: qty 15

## 2024-02-13 MED ORDER — PANTOPRAZOLE SODIUM 20 MG PO TBEC: 20.0000 mg | DELAYED_RELEASE_TABLET | Freq: Every day | ORAL | 1 refills | Status: AC

## 2024-02-13 MED ORDER — ONDANSETRON HCL 4 MG/2ML IJ SOLN
4.0000 mg | Freq: Once | INTRAMUSCULAR | Status: AC
  Administered 2024-02-13: 4 mg via INTRAVENOUS
  Filled 2024-02-13: qty 2

## 2024-02-13 MED ORDER — SODIUM CHLORIDE 0.9 % IV BOLUS
1000.0000 mL | Freq: Once | INTRAVENOUS | Status: AC
  Administered 2024-02-13: 1000 mL via INTRAVENOUS

## 2024-02-13 MED ORDER — IOHEXOL 300 MG/ML  SOLN
80.0000 mL | Freq: Once | INTRAMUSCULAR | Status: AC | PRN
  Administered 2024-02-13: 80 mL via INTRAVENOUS

## 2024-02-13 MED ORDER — ALUM & MAG HYDROXIDE-SIMETH 200-200-20 MG/5ML PO SUSP
30.0000 mL | Freq: Once | ORAL | Status: AC
  Administered 2024-02-13: 30 mL via ORAL
  Filled 2024-02-13: qty 30

## 2024-02-13 NOTE — Discharge Instructions (Signed)
 Return for any problem.   Follow up with DR. PYRTLE as instructed.   Use Protonix  as prescribed - instead of Omeprazole  or Pepcid.   CT imaging of your abdomen demonstrated:  1. Interval development of a 16 x 13 mm ill-defined right retrocrural density,  possibly a lymph node or inflammatory process. Recommend clinical and imaging  follow-up to assess stability or resolution.  2. A 21 x 11 mm ill-defined left periaortic density just below the left adrenal  gland, concerning for either lymph node or inflammatory process. Recommend  clinical and imaging follow-up to assess stability or resolution.  3. Stable enlargement of the left adrenal gland.   These abdominal lymph nodes require close follow up with your PCP. An enlarged lymph node can be seen with early cancer. Continued workup with your PCP is indicated.

## 2024-02-13 NOTE — ED Provider Notes (Signed)
 [place] EMERGENCY DEPARTMENT AT [facility] Provider Note   CSN: [id number] Arrival date & time: 02/13/24  9186     Patient presents with: Abdominal Pain and Fever   [name] is a 63 y.o. female.   63 year old female with prior medical history as detailed below presents for evaluation.  Patient complains of acute abdominal discomfort and associated fever.  Symptoms began approximately 2 to 3 days ago.  She complains of diffuse crampy pain all over her abdomen.  She reports a temperature as high as 104 at home.  She reports some nausea but no vomiting.  She denies diarrhea.  Her last bowel movement was yesterday and was normal.  She denies prior abdominal surgery.  She did not take anything for her symptoms this morning.  She reports a history of prior colitis/enteritis.  Symptoms today are similar to that episode.  The history is provided by the patient and medical records.  Fever      Prior to Admission medications   Medication Sig Start Date End Date Taking? Authorizing Provider  albuterol  (VENTOLIN  HFA) 108 (90 Base) MCG/ACT inhaler Inhale 2 puffs into the lungs every 6 (six) hours as needed for wheezing or shortness of breath. 08/06/23   Tysinger, Alm RAMAN, PA-C  aspirin  EC 81 MG tablet Take 1 tablet (81 mg total) by mouth daily. 09/06/22   Tysinger, Alm RAMAN, PA-C  budesonide -formoterol  (SYMBICORT ) 160-4.5 MCG/ACT inhaler Inhale 2 puffs into the lungs 2 (two) times daily. 10/08/23   Tysinger, Alm RAMAN, PA-C  calcium  carbonate (CALCIUM  600) 600 MG TABS tablet Take 1 tablet (600 mg total) by mouth 2 (two) times daily with a meal. 04/13/20   Tysinger, Alm RAMAN, PA-C  Continuous Glucose Sensor (DEXCOM G7 SENSOR) MISC 1 Device by Does not apply route as directed. 06/04/23   Shamleffer, Ibtehal Jaralla, MD  DULoxetine  (CYMBALTA ) 60 MG capsule Take 1 capsule by mouth twice daily 10/07/23   Tysinger, Alm RAMAN, PA-C  fenofibrate  (TRICOR ) 145 MG tablet Take 1 tablet (145 mg  total) by mouth daily. 11/22/23   Shamleffer, Donell Cardinal, MD  ferrous gluconate  (FERGON) 324 MG tablet Take 1 tablet (324 mg total) by mouth daily with breakfast. 08/06/23   Tysinger, Alm RAMAN, PA-C  furosemide  (LASIX ) 80 MG tablet Take 1 tablet by mouth once daily 10/07/23   Tysinger, Alm RAMAN, PA-C  Glucagon  (GVOKE HYPOPEN  2-PACK) 0.5 MG/0.1ML SOAJ Inject 1 Dose into the skin daily as needed. 04/17/23   Tysinger, Alm RAMAN, PA-C  Insulin  Aspart, w/Niacinamide, (FIASP ) 100 UNIT/ML SOLN Increase  Fiasp  40 units with each meal  Fiasp  correctional insulin : ADD extra units on insulin  to your meal-time Fiasp  dose if your blood sugars are higher than 150. Use the scale below to help guide you. Max daily 100 units 09/12/23   Shamleffer, Donell Cardinal, MD  Insulin  Disposable Pump (OMNIPOD 5 G7 INTRO, GEN 5,) KIT 1 Device by Does not apply route every other day. 08/09/23   Shamleffer, Ibtehal Jaralla, MD  Insulin  Disposable Pump (OMNIPOD 5 G7 PODS, GEN 5,) MISC 1 Device by Does not apply route every other day. 08/09/23   Shamleffer, Ibtehal Jaralla, MD  losartan  (COZAAR ) 25 MG tablet Take 1 tablet (25 mg total) by mouth daily. 11/22/23   Shamleffer, Ibtehal Jaralla, MD  methocarbamol  (ROBAXIN ) 500 MG tablet TAKE 1 TABLET BY MOUTH EVERY 8 HOURS AS NEEDED FOR MUSCLE SPASM 11/28/23   Babs Arthea DASEN, MD  metoprolol  succinate (TOPROL -XL) 25 MG 24 hr tablet  Take 1 tablet by mouth once daily 10/07/23   Tysinger, Alm RAMAN, PA-C  naloxone [facility]) nasal spray 4 mg/0.1 mL  02/21/21   [provider]  nortriptyline  (PAMELOR ) 75 MG capsule Take 1 capsule by mouth at bedtime 09/30/23   Debby Fidela CROME, NP  omeprazole  (PRILOSEC ) 20 MG capsule Take 1 capsule by mouth once daily 08/22/23   Tysinger, Alm RAMAN, PA-C  oxyCODONE -acetaminophen  (PERCOCET) 10-325 MG tablet Take 1 tablet by mouth 5 (five) times daily. 12/17/23   Debby Fidela CROME, NP  potassium chloride  (KLOR-CON ) 10 MEQ tablet Take 1 tablet (10 mEq total) by mouth 2  (two) times daily. 10/08/23   Tysinger, Alm RAMAN, PA-C  pregabalin  (LYRICA ) 50 MG capsule Take 1 capsule (50 mg total) by mouth 2 (two) times daily. 11/06/23   Babs Arthea DASEN, MD  rosuvastatin  (CRESTOR ) 20 MG tablet Take 1 tablet (20 mg total) by mouth daily. 11/22/23   Shamleffer, Ibtehal Jaralla, MD  spironolactone  (ALDACTONE ) 25 MG tablet Take 0.5 tablets (12.5 mg total) by mouth daily. 10/08/23   Tysinger, Alm RAMAN, PA-C  sucralfate  (CARAFATE ) 1 g tablet Take 1 tablet (1 g total) by mouth 4 (four) times daily. 09/29/22   Dasie Faden, MD  Vitamin D , Ergocalciferol , (DRISDOL ) 1.25 MG (50000 UNIT) CAPS capsule Take 1 capsule (50,000 Units total) by mouth every 7 (seven) days. 10/08/23   Tysinger, Alm RAMAN, PA-C    Allergies: Jardiance  [empagliflozin ], Mounjaro  [tirzepatide ], Ozempic  (0.25 or 0.5 mg-dose) [semaglutide (0.25 or 0.5mg -dos)], and Tramadol     Review of Systems  Constitutional:  Positive for fever.  All other systems reviewed and are negative.   Updated Vital Signs BP 123/65 (BP Location: Right Arm)   Pulse 88   Temp 99.3 F (37.4 C) (Oral)   Resp 16   LMP 02/09/2017   SpO2 96%   Physical Exam Vitals and nursing note reviewed.  Constitutional:      General: She is not in acute distress.    Appearance: She is well-developed.  HENT:     Head: Normocephalic and atraumatic.  Eyes:     Conjunctiva/sclera: Conjunctivae normal.  Cardiovascular:     Rate and Rhythm: Normal rate and regular rhythm.     Heart sounds: No murmur heard. Pulmonary:     Effort: Pulmonary effort is normal. No respiratory distress.     Breath sounds: Normal breath sounds.  Abdominal:     Palpations: Abdomen is soft.     Tenderness: There is generalized abdominal tenderness.  Musculoskeletal:        General: No swelling.     Cervical back: Neck supple.  Skin:    General: Skin is warm and dry.     Capillary Refill: Capillary refill takes less than 2 seconds.  Neurological:     Mental Status: She  is alert.  Psychiatric:        Mood and Affect: Mood normal.     (all labs ordered are listed, but only abnormal results are displayed) Labs Reviewed  CULTURE, BLOOD (ROUTINE X 2)  CULTURE, BLOOD (ROUTINE X 2)  CBC WITH DIFFERENTIAL/PLATELET  COMPREHENSIVE METABOLIC PANEL WITH GFR  LIPASE, BLOOD  URINALYSIS, W/ REFLEX TO CULTURE (INFECTION SUSPECTED)  I-STAT CHEM 8, ED  I-STAT CG4 LACTIC ACID, ED    EKG: None  Radiology: No results found.   Procedures   Medications Ordered in the ED  sodium chloride  0.9 % bolus 1,000 mL (has no administration in time range)  ondansetron  (ZOFRAN ) injection 4 mg (has  no administration in time range)  morphine  (PF) 4 MG/ML injection 4 mg (has no administration in time range)                                    Medical Decision Making Patient presents with reported fever and abdominal pain.  She appears to be nontoxic on exam.  Initial labs are on the whole reassuring with mild white blood cell count elevation at 11.  CT imaging demonstrates: 1. Interval development of a 16 x 13 mm ill-defined right retrocrural density, possibly a lymph node or inflammatory process. Recommend clinical and imaging follow-up to assess stability or resolution. 2. A 21 x 11 mm ill-defined left periaortic density just below the left adrenal gland, concerning for either lymph node or inflammatory process. Recommend clinical and imaging follow-up to assess stability or resolution. 3. Stable enlargement of the left adrenal gland.  These findings are not suggestive of acute pathology causing her symptoms.  Patient feels much improved after treatment.  I suspect that her symptoms may be more related to GERD and other pathology.  Patient feels much improved at time of reevaluation.  She desires discharge home.  She was offered admission and/or overnight observation.  She declines.  She understands need for follow-up with PCP and with GI.  Strict return  precautions given and understood.  Importance of close follow-up stressed.    Amount and/or Complexity of Data Reviewed Labs: ordered. Radiology: ordered.  Risk OTC drugs. Prescription drug management.        Final diagnoses:  Epigastric pain    ED Discharge Orders          Ordered    pantoprazole  (PROTONIX ) 20 MG tablet  Daily        02/13/24 1328               Laurice Maude BROCKS, MD 02/13/24 1335

## 2024-02-13 NOTE — ED Triage Notes (Signed)
 Pt c/o generalized abd pain since Tuesday. Pt states she began having nausea today, no vomiting or diarrhea per pt. Pt also states she had fever of 104 yesterday. No URI symptoms per pt.

## 2024-02-14 ENCOUNTER — Telehealth: Payer: Self-pay | Admitting: Internal Medicine

## 2024-02-14 NOTE — Telephone Encounter (Signed)
 Patient returned the call. Appointment scheduled with Colleen Monday, 02/17/24 @ 2 PM.

## 2024-02-14 NOTE — Telephone Encounter (Signed)
Left voicemail for patient to return the call ?

## 2024-02-14 NOTE — Telephone Encounter (Signed)
 Inbound call from patient stating that she was seen in the Er for abdominal pain with fever. She was advised to call us  to get an appointment as soon as possible. I advised her that Dr. Albertus was currently booked up and the PA's were scheduling for January. She requested that I send a message to the nurse to discuss if she can be seen before then. Please advise.

## 2024-02-17 ENCOUNTER — Ambulatory Visit: Admitting: Nurse Practitioner

## 2024-02-17 ENCOUNTER — Encounter: Payer: Self-pay | Admitting: Nurse Practitioner

## 2024-02-17 VITALS — BP 126/70 | HR 60 | Ht 63.0 in

## 2024-02-17 DIAGNOSIS — R1084 Generalized abdominal pain: Secondary | ICD-10-CM

## 2024-02-17 DIAGNOSIS — Z860101 Personal history of adenomatous and serrated colon polyps: Secondary | ICD-10-CM | POA: Diagnosis not present

## 2024-02-17 NOTE — Progress Notes (Signed)
 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  Past cholecystectomy.  She is followed by Dr. Albertus.   Discussed the use of AI scribe software for clinical note transcription with the patient, who gave verbal consent to proceed.  History of Present Illness [name] is a 63 year old female who presented to the ED 02/13/2024 due to having a fever of 104 F and acute generalized abdominal pain.  Labs in the ED showed a WBC count 11.3.  Hemoglobin 10.4 down from 12.18 September 2023.  Hematocrit 33.8.  Platelets 458.  BUN 19.  Creatinine 1.61 which is baseline.  Normal LFTs.  Lipase 18.  Lactic acid 1.3.  Blood cultures no growth at 4 days.  Urinalysis showed a small amount of leukocytes.  CTAP with contrast showed an interval development of a 16 x 13 mm ill-defined right retrocrural density, possibly a lymph node or inflammatory process, a 21 x 11 mm ill-defined left periaortic density just below the left adrenal gland, concerning for either lymph node or inflammatory process and a stable enlargement of the left adrenal gland.  Her CT findings were not suggestive of acute pathology per the ED physician who felt her symptoms were likely related to GERD and other pathology.  She was offered admission for observation overnight which she declined.  She was discharged home on Pantoprazole  20 mg once daily.  She denies having any further episodes of generalized abdominal pain since she presented to the ED 12/4.  She denied any obvious precipitating factors such as constipation or  food trigger.  However, she stated her abdominal pain was similar to the pain when she was diagnosed with enteritis or colitis 09/2022. The pain was diffuse, involving the entire abdomen, both above and below the umbilicus. Eating exacerbated the pain, regardless of the type of food consumed. No nausea, vomiting, or diarrhea, although she mentioned gagging once or twice. She is currently eating less due to fear of pain exacerbation. No other gastrointestinal symptoms such as heartburn or dysphagia.   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 She takes vegetable laxatives twice daily with her morning medication to maintain regular bowel movements.  She underwent an upper endoscopy and colonoscopy 10/08/2022.  The EGD identified a 2 cm hiatal hernia and gastritis without evidence of H. pylori.  The colonoscopy identified one 5 mm tubular adenomatous polyp removed from the transverse colon and mild diverticulosis to the sigmoid colon.   She denies having any heartburn or dysphagia.  She remains on Pantoprazole  20 mg once daily since seen in the ED 12/4 as noted above.      Latest Ref Rng & Units 02/13/2024    9:12 AM 02/13/2024    8:20 AM 10/07/2023    8:53 AM  CBC  WBC 4.0 - 10.5 K/uL  11.3  8.8   Hemoglobin 12.0 - 15.0 g/dL 88.7  89.5  87.0   Hematocrit 36.0 - 46.0 % 33.0  33.8  40.1  Platelets 150 - 400 K/uL  458  354        Latest Ref Rng & Units 02/13/2024    9:12 AM 02/13/2024    8:20 AM 10/07/2023    8:53 AM  CMP  Glucose 70 - 99 mg/dL 875  875  878   BUN 8 - 23 mg/dL 22  19  25    Creatinine 0.44 - 1.00 mg/dL 8.29  8.38  8.38   Sodium 135 - 145 mmol/L 139  138  139   Potassium 3.5 - 5.1 mmol/L 3.6  3.7  4.2   Chloride 98 - 111 mmol/L 107  103  102   CO2 22 - 32 mmol/L  22  22   Calcium  8.9 - 10.3 mg/dL  89.7  89.2   Total Protein 6.5  - 8.1 g/dL  6.9  6.3   Total Bilirubin 0.0 - 1.2 mg/dL  0.5  0.3   Alkaline Phos 38 - 126 U/L  83  67   AST 15 - 41 U/L  31  14   ALT 0 - 44 U/L  33  17     CTAP with contrast 02/13/2024:  FINDINGS:   LOWER CHEST: Interval development of 16 x 13 mm ill-defined density in right retrocrural area which may represent lymph node or other inflammation.   LIVER: The liver is unremarkable.   GALLBLADDER AND BILE DUCTS: Status post cholecystectomy. No biliary ductal dilatation.   SPLEEN: No acute abnormality.   PANCREAS: No acute abnormality.   ADRENAL GLANDS: Stable enlargement of left adrenal gland is noted. 21 x 11 mm ill-defined left periaortic density is noted just below left adrenal gland also concerning for either lymph node or inflammation.   KIDNEYS, URETERS AND BLADDER: No stones in the kidneys or ureters. No hydronephrosis. No perinephric or periureteral stranding. Urinary bladder is unremarkable.   GI AND BOWEL: Stomach demonstrates no acute abnormality. There is no bowel obstruction. Small fat-containing periumbilical hernia.   PERITONEUM AND RETROPERITONEUM: No ascites. No free air.   VASCULATURE: Aorta is normal in caliber. Aortic atherosclerosis.   LYMPH NODES: No other significant lymphadenopathy.   REPRODUCTIVE ORGANS: Exophytic calcified uterine fibroid is again noted and stable.   BONES AND SOFT TISSUES: No acute osseous abnormality. Severe osteoarthritis of the left hip is noted. No focal soft tissue abnormality.   IMPRESSION: 1. Interval development of a 16 x 13 mm ill-defined right retrocrural density, possibly a lymph node or inflammatory process. Recommend clinical and imaging follow-up to assess stability or resolution. 2. A 21 x 11 mm ill-defined left periaortic density just below the left adrenal gland, concerning for either lymph node or inflammatory process. Recommend clinical and imaging follow-up to assess stability or  resolution. 3. Stable enlargement of the left adrenal gland.  Small bowel enteroscopy 08/27/2023: FINDINGS: Lower chest: Right middle lobe pulmonary nodules up to 3 mm can be presumed benign and do/does not warrant imaging follow-up per Fleischner criteria. Normal heart size without pericardial or pleural effusion.   Hepatobiliary: Mild hepatic steatosis. Cholecystectomy, without biliary ductal dilatation.   Pancreas: Normal, without mass or ductal dilatation.   Spleen: Normal in size, without focal abnormality.   Adrenals/Urinary Tract: Normal right adrenal gland. Mild left adrenal thickening. Left renal too small to characterize lesions are most likely cysts and do not warrant specific imaging follow-up. Normal right kidney. Decompressed urinary bladder.   Stomach/Bowel: Normal stomach, without wall thickening. Normal colon. Appendectomy.   The extent of small bowel distension  with neutral contrast is moderate to good. Normal terminal ileum including on 127/3. The previous small bowel wall thickening has resolved. No small bowel mass, mucosal hyperenhancement.   Vascular/Lymphatic: Aortic atherosclerosis. Small ileocolic mesenteric nodes are similar to less distinct than on the prior including on 49/2.   Reproductive: Uterine fibroids, including a calcified left-sided body/fundal lesion of 2.3 cm.   Other: No significant free fluid.   Musculoskeletal: Advanced left hip osteoarthritis. Degenerate disc disease at the L4-5 level.   IMPRESSION: 1. Resolved small-bowel wall thickening since 09/29/2022. No evidence of small bowel pathology. 2. Hepatic steatosis. 3. Uterine fibroids. 4.  Aortic Atherosclerosis   CTAP with contrast 09/29/2022: FINDINGS: Lower chest: No acute abnormality. Fat-containing medial left Bochdalek hernia.   Hepatobiliary: Diffusely decreased attenuation of the liver consistent with steatosis. Status post cholecystectomy. No  biliary dilatation.   Pancreas: Unremarkable.   Spleen: Unremarkable.   Adrenals/Urinary Tract: Unremarkable right adrenal gland. Unchanged thickening of the left adrenal gland. No renal calculi or hydronephrosis. 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 Unremarkable bladder.   Stomach/Bowel: The stomach is unremarkable. There is no evidence of bowel obstruction. There is wall thickening of a loop of distal ileum in the right lower quadrant with adjacent mild fat infiltration (series 2, image 59). Status post appendectomy.   Vascular/Lymphatic: Mild abdominal aortic atherosclerosis without aneurysm. Mild enlargement of right lower quadrant mesenteric lymph nodes adjacent to the thickened small bowel loops which measure up to 7 mm in short axis and may be reactive. No evidence of lymphadenopathy elsewhere in the abdomen or pelvis.   Reproductive: Uterine fibroids.  No adnexal mass.   Other: No ascites or pneumoperitoneum.   Musculoskeletal: Bilateral hip osteoarthrosis, asymmetrically severe on the left. Asymmetrically advanced disc degeneration on the right at L4-5.   IMPRESSION: 1. Wall thickening of a small bowel loop in the right lower quadrant with mild adjacent stranding which may reflect mild enteritis, although an underlying mass cannot be excluded. 2. Hepatic steatosis. 3. Uterine fibroids. 4.  Aortic Atherosclerosis    GI PROCEDURES:  EGD 10/08/2022: - Normal esophagus.  - 2 cm hiatal hernia.  - Gastritis. Biopsied.  - Normal examined duodenum.   Colonoscopy 10/08/2022: - One 5 mm polyp in the transverse colon, removed with a cold snare.  Resected and retrieved.  - Mild diverticulosis in the sigmoid colon.  - Internal hemorrhoids. - 7 year recall colonoscopy  1. Surgical [P], gastric body and antrum - ANTRAL MUCOSA WITH REACTIVE AND HEALING EROSIVE GASTRITIS. - OXYNTIC MUCOSA WITH CHANGES CONSISTENT WITH PROTON PUMP INHIBITOR USE. - NEGATIVE FOR H. PYLORI, INTESTINAL METAPLASIA, DYSPLASIA, AND MALIGNANCY. 2. Surgical [P], colon, transverse, polyp (1) - TUBULAR ADENOMA. - NEGATIVE FOR HIGH-GRADE DYSPLASIA AND MALIGNANCY.  Current Outpatient Medications on File Prior to Visit  Medication Sig Dispense Refill   albuterol  (VENTOLIN  HFA) 108 (90 Base) MCG/ACT inhaler Inhale 2 puffs into the lungs every 6 (six) hours as needed for wheezing or shortness of breath. 8 g 1   aspirin  EC 81 MG tablet Take 1 tablet (81 mg total) by mouth daily. 90 tablet 3   budesonide -formoterol  (SYMBICORT ) 160-4.5 MCG/ACT inhaler Inhale 2 puffs into the lungs 2 (two) times daily. 1 each 5  calcium  carbonate (CALCIUM  600) 600 MG TABS tablet Take 1 tablet (600 mg total) by mouth 2 (two) times daily with a meal. 180 tablet 3   DULoxetine  (CYMBALTA ) 60 MG capsule Take 1 capsule by mouth twice daily 180 capsule 3   fenofibrate  (TRICOR ) 145 MG tablet Take 1 tablet (145 mg total) by mouth daily. 90 tablet 0   ferrous gluconate  (FERGON) 324 MG tablet Take 1 tablet (324 mg total) by mouth daily with breakfast. 90 tablet 0   furosemide  (LASIX ) 80 MG tablet Take 1 tablet by mouth once daily 90 tablet 3   Glucagon  (GVOKE HYPOPEN  2-PACK) 0.5 MG/0.1ML SOAJ Inject 1 Dose into the skin daily as needed. 0.1 mL 2   Insulin  Disposable Pump (OMNIPOD 5 G7 INTRO, GEN 5,) KIT 1 Device by Does not apply route every other day. 1 kit 0   Insulin  Disposable Pump (OMNIPOD 5 G7 PODS, GEN 5,) MISC 1 Device by Does not apply route every other day. 45 each 3   losartan  (COZAAR ) 25 MG tablet Take 1 tablet (25 mg total) by mouth daily. 90 tablet 1   methocarbamol  (ROBAXIN ) 500 MG tablet TAKE  1 TABLET BY MOUTH EVERY 8 HOURS AS NEEDED FOR MUSCLE SPASM 60 tablet 5   metoprolol  succinate (TOPROL -XL) 25 MG 24 hr tablet Take 1 tablet by mouth once daily 90 tablet 3   naloxone (NARCAN) nasal spray 4 mg/0.1 mL      nortriptyline  (PAMELOR ) 75 MG capsule Take 1 capsule by mouth at bedtime 90 capsule 1   oxyCODONE -acetaminophen  (PERCOCET) 10-325 MG tablet Take 1 tablet by mouth 5 (five) times daily. 150 tablet 0   pantoprazole  (PROTONIX ) 20 MG tablet Take 1 tablet (20 mg total) by mouth daily. 30 tablet 1   potassium chloride  (KLOR-CON ) 10 MEQ tablet Take 1 tablet (10 mEq total) by mouth 2 (two) times daily. 180 tablet 2   pregabalin  (LYRICA ) 50 MG capsule Take 1 capsule (50 mg total) by mouth 2 (two) times daily. 60 capsule 2   rosuvastatin  (CRESTOR ) 20 MG tablet Take 1 tablet (20 mg total) by mouth daily. 90 tablet 0   spironolactone  (ALDACTONE ) 25 MG tablet Take 0.5 tablets (12.5 mg total) by mouth daily. 30 tablet 2   Vitamin D , Ergocalciferol , (DRISDOL ) 1.25 MG (50000 UNIT) CAPS capsule Take 1 capsule (50,000 Units total) by mouth every 7 (seven) days. 12 capsule 3   Continuous Glucose Sensor (DEXCOM G7 SENSOR) MISC 1 Device by Does not apply route as directed. (Patient not taking: Reported on 02/17/2024) 9 each 3   Insulin  Aspart, w/Niacinamide, (FIASP ) 100 UNIT/ML SOLN Increase  Fiasp  40 units with each meal  Fiasp  correctional insulin : ADD extra units on insulin  to your meal-time Fiasp  dose if your blood sugars are higher than 150. Use the scale below to help guide you. Max daily 100 units (Patient not taking: Reported on 02/17/2024) 100 mL 6   omeprazole  (PRILOSEC ) 20 MG capsule Take 1 capsule by mouth once daily (Patient not taking: Reported on 02/17/2024) 90 capsule 1   sucralfate  (CARAFATE ) 1 g tablet Take 1 tablet (1 g total) by mouth 4 (four) times daily. (Patient not taking: Reported on 02/17/2024) 30 tablet 0   No current facility-administered medications on file prior to visit.    Allergies  Allergen Reactions   Jardiance  [Empagliflozin ] Other (See Comments)    Recurrent yeast infections    Mounjaro  [Tirzepatide ] Other (See Comments)    Stomach cramps per patient    Ozempic  (  0.25 Or 0.5 Mg-Dose) [Semaglutide (0.25 Or 0.5mg -Dos)]     Severe nausea, bad gas   Tramadol  Nausea Only    Current Medications, Allergies, Past Medical History, Past Surgical History, Family History and Social History were reviewed in [employer] record.   Review of Systems:   Constitutional: Negative for fever, sweats, chills or weight loss.  Respiratory: Negative for shortness of breath.   Cardiovascular: Negative for chest pain, palpitations and leg swelling.  Gastrointestinal: See HPI.  Musculoskeletal: Negative for back pain or muscle aches.  Neurological: Negative for dizziness, headaches or paresthesias.    Physical Exam: Ht 5' 3 (1.6 m)   LMP 02/09/2017   BMI 44.46 kg/m  BP 126/70   Pulse 60   Ht 5' 3 (1.6 m)   LMP 02/09/2017   BMI 44.46 kg/m   Wt Readings from Last 3 Encounters:  12/24/23 251 lb (113.9 kg)  12/17/23 249 lb 12.8 oz (113.3 kg)  11/22/23 246 lb (111.6 kg)    General: 63 year old female in no acute distress. Head: Normocephalic and atraumatic. Eyes: No scleral icterus. Conjunctiva pink . Ears: Normal auditory acuity. Mouth: Dentition intact. No ulcers or lesions.  Neck: Significant thyromegaly. Lungs: Clear throughout to auscultation. Heart: Regular rate and rhythm, no murmur. Abdomen: Soft, nondistended. Mild tenderness half way between the xyphoid process and umbilicus without rebound or guarding. No masses or hepatomegaly. Normal bowel sounds x 4 quadrants.  Rectal: Deferred.  Musculoskeletal: Symmetrical with no gross deformities. Extremities: No edema. Neurological: Alert oriented x 4. No focal deficits.  Psychological: Alert and cooperative. Normal mood and affect  Assessment and  Recommendations:  63 year old female presented to the ED with fever and significant generalized abdominal pain 02/13/2024. CTAP with contrast showed an interval development of a 16 x 13 mm ill-defined right retrocrural density (possibly a lymph node or inflammatory process), a 21 x 11 mm ill-defined left periaortic density just below the left adrenal gland concerning for either lymph node or inflammatory process and a stable enlargement of the left adrenal gland.   - Patient to follow-up with PCP regarding CTAP findings, consider chest CT and possible IR consult to biopsy the right retrocrural and left periaortic density -CBC, BMP.  Patient wishes to have these labs done by her PCP, she intends to see Theresa Gent, PA-C later this week. - Patient will contact our office if her abdominal pain recurs  History of GERD.  EGD 09/2022 identified a 2 cm hiatal hernia and gastritis without evidence of H. pylori - Continue Pantoprazole  20 mg once daily for now  History of colon polyps.  Colonoscopy 09/2022 identified one 5 mm tubular adenomatous polyp removed from the transverse colon. - Next colon polyp surveillance colonoscopy due 09/2029  Chronic pain (left-sided sciatica), on chronic opioids per pain management  History of a multinodular goiter.  Last TSH level 0.284 on 10/07/2023. - Recommend follow-up with endocrinologist and PCP, recheck TSH and free T4 levels at next visit  .

## 2024-02-17 NOTE — Patient Instructions (Addendum)
 Follow up with your primary care provider regarding your abdominal/pelvic CT scan results which showed a possible lymph node or inflammatory process to the right retrocrural area (near the diaphragm) and a density below the left adrenal gland. May need to refer to interventional radiology for biopsy.   Please have your primary provider recheck a CBC and CMP at the time of your office visit.   Contact our office if your abdominal pain recurs   _______________________________________________________  If your blood pressure at your visit was 140/90 or greater, please contact your primary care physician to follow up on this.  _______________________________________________________  If you are age 19 or older, your body mass index should be between 23-30. Your Body mass index is 44.46 kg/m. If this is out of the aforementioned range listed, please consider follow up with your Primary Care Provider.  If you are age 16 or younger, your body mass index should be between 19-25. Your Body mass index is 44.46 kg/m. If this is out of the aformentioned range listed, please consider follow up with your Primary Care Provider.   ________________________________________________________  The [facility] providers would like to encourage you to use MYCHART to communicate with providers for non-urgent requests or questions.  Due to long hold times on the telephone, sending your provider a message by [facility] may be a faster and more efficient way to get a response.  Please allow 48 business hours for a response.  Please remember that this is for non-urgent requests.  _______________________________________________________  [facility] is using a team-based approach to care.  Your team is made up of your doctor and two to three APPS. Our APPS (Nurse Practitioners and Physician Assistants) work with your physician to ensure care continuity for you. They are fully qualified to address your health concerns  and develop a treatment plan. They communicate directly with your gastroenterologist to care for you. Seeing the Advanced Practice Practitioners on your physician's team can help you by facilitating care more promptly, often allowing for earlier appointments, access to diagnostic testing, procedures, and other specialty referrals.

## 2024-02-18 LAB — CULTURE, BLOOD (ROUTINE X 2)
Culture: NO GROWTH
Culture: NO GROWTH
Special Requests: ADEQUATE
Special Requests: ADEQUATE

## 2024-02-20 ENCOUNTER — Encounter: Attending: Registered Nurse | Admitting: Registered Nurse

## 2024-02-20 DIAGNOSIS — Z5181 Encounter for therapeutic drug level monitoring: Secondary | ICD-10-CM | POA: Insufficient documentation

## 2024-02-20 DIAGNOSIS — M47817 Spondylosis without myelopathy or radiculopathy, lumbosacral region: Secondary | ICD-10-CM | POA: Diagnosis present

## 2024-02-20 DIAGNOSIS — M7062 Trochanteric bursitis, left hip: Secondary | ICD-10-CM | POA: Diagnosis not present

## 2024-02-20 DIAGNOSIS — M25552 Pain in left hip: Secondary | ICD-10-CM | POA: Insufficient documentation

## 2024-02-20 DIAGNOSIS — G894 Chronic pain syndrome: Secondary | ICD-10-CM | POA: Diagnosis present

## 2024-02-20 DIAGNOSIS — M7061 Trochanteric bursitis, right hip: Secondary | ICD-10-CM | POA: Diagnosis not present

## 2024-02-20 DIAGNOSIS — M25551 Pain in right hip: Secondary | ICD-10-CM | POA: Diagnosis not present

## 2024-02-20 DIAGNOSIS — M5416 Radiculopathy, lumbar region: Secondary | ICD-10-CM | POA: Diagnosis not present

## 2024-02-20 DIAGNOSIS — Z79891 Long term (current) use of opiate analgesic: Secondary | ICD-10-CM | POA: Diagnosis not present

## 2024-02-20 DIAGNOSIS — G8929 Other chronic pain: Secondary | ICD-10-CM | POA: Insufficient documentation

## 2024-02-20 NOTE — Progress Notes (Unsigned)
 Subjective:    Patient ID: [name], female    DOB: 1960-11-25, 63 y.o.   MRN: [id number]  HPI: [name] is a 63 y.o. female who is scheduled for Telephone visit, due to illness, her appointment was changed to telephone visit. I connected with [name] via telephone and verified that I am speaking with the correct person using two identifiers.  Location: Patient: In her home Provider: In the office   I discussed the limitations, risks, security and privacy concerns of performing an evaluation and management service by telephone and the availability of in person appointments. I also discussed with the patient that there may be a patient responsible charge related to this service. The patient expressed understanding and agreed to proceed.   She states her pain is located in her lower back radiating into her bilateral  hips and bilateral lower extremities. She rates her pain 7-8. Her current exercise regime is walking and performing stretching exercises.  Ms. Brasil Morphine  equivalent is 75.00 MME.   Last Oral Swab was Performed on 12/17/2023, it was consistent.    Pain Inventory Average Pain 7 Pain Right Now 7-8 My pain is constant, sharp, and tingling  In the last 24 hours, has pain interfered with the following? General activity 7 Relation with others 7 Enjoyment of life 7 What TIME of day is your pain at its worst? morning , daytime, evening, night, and varies Sleep (in general) Fair  Pain is worse with: walking Pain improves with: rest and medication Relief from Meds: 8  Family History  Problem Relation Age of Onset   Hypertension Mother    Alzheimer's disease Mother    Diabetes Father    Breast cancer Sister 9   Anesthesia problems Neg Hx    Hypotension Neg Hx    Malignant hyperthermia Neg Hx    Pseudochol deficiency Neg Hx    Colon cancer Neg Hx    Esophageal cancer Neg Hx    Stomach cancer Neg Hx    Rectal cancer Neg Hx     Thyroid  disease Neg Hx    Social History   Socioeconomic History   Marital status: Divorced    Spouse name: Not on file   Number of children: 0   Years of education: Not on file   Highest education level: Not on file  Occupational History   Occupation: TEACHER ASSISTANT  Tobacco Use   Smoking status: Never   Smokeless tobacco: Never  Vaping Use   Vaping status: Never Used  Substance and Sexual Activity   Alcohol use: No   Drug use: No   Sexual activity: Yes  Other Topics Concern   Not on file  Social History Narrative   Lives alone, walks for exercise, is a runner, broadcasting/film/video.  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   Social Drivers of Health   Tobacco Use: Low Risk (02/13/2024)   Patient History    Smoking Tobacco Use: Never    Smokeless Tobacco Use: Never    Passive Exposure: Not on file  Financial Resource Strain: Not on file  Food Insecurity: Not on file  Transportation Needs: Not on file  Physical Activity: Not on file  Stress: Not on file  Social Connections: Not on file  Depression (PHQ2-9): Low Risk (11/06/2023)   Depression (PHQ2-9)    PHQ-2 Score: 0  Alcohol Screen: Not on file  Housing: Not on file  Utilities: Not on file  Health Literacy: Not on file   Past Surgical History:  Procedure Laterality Date   ANAL EXAMINATION UNDER ANESTHESIA  02/21/2011   anal fistula   BREAST EXCISIONAL BIOPSY Left    BREAST SURGERY  patient does not remember date of procedure   pull fluid off lft br   CHOLECYSTECTOMY N/A 09/12/2018   Procedure: LAPAROSCOPIC CHOLECYSTECTOMY;  Surgeon: Aron Shoulders, MD;  Location: [facility] OR;  Service: General;  Laterality: N/A;   COLONOSCOPY  10/08/2016   2 day prep 1 TA,   ELECTROPHYSIOLOGIC STUDY N/A 05/05/2015   Procedure: SVT Ablation;  Surgeon: Will Gladis Norton, MD;  Location: [facility];  Service: Cardiovascular;  Laterality: N/A;   EP IMPLANTABLE DEVICE N/A 01/30/2016   Procedure: Loop Recorder Insertion;  Surgeon: Jerel Balding, MD;   Location: [facility];  Service: Cardiovascular;  Laterality: N/A;   INCISE AND DRAIN ABCESS     abscess on right thigh and buttock   KNEE ARTHROSCOPY     left   LAPAROSCOPIC APPENDECTOMY N/A 09/19/2018   Procedure: APPENDECTOMY LAPAROSCOPIC;  Surgeon: Signe Mitzie LABOR, MD;  Location: [facility] OR;  Service: General;  Laterality: N/A;   SHOULDER SURGERY  04/14/2009   right   Past Surgical History:  Procedure Laterality Date   ANAL EXAMINATION UNDER ANESTHESIA  02/21/2011   anal fistula   BREAST EXCISIONAL BIOPSY Left    BREAST SURGERY  patient does not remember date of procedure   pull fluid off lft br   CHOLECYSTECTOMY N/A 09/12/2018   Procedure: LAPAROSCOPIC CHOLECYSTECTOMY;  Surgeon: Aron Shoulders, MD;  Location: [facility];  Service: General;  Laterality: N/A;   COLONOSCOPY  10/08/2016   2 day prep 1 TA,   ELECTROPHYSIOLOGIC STUDY N/A 05/05/2015   Procedure: SVT Ablation;  Surgeon: Will Gladis Norton, MD;  [facility]: [facility];  Service: Cardiovascular;  Laterality: N/A;   EP IMPLANTABLE DEVICE N/A 01/30/2016   Procedure: Loop Recorder Insertion;  Surgeon: Jerel Balding, MD;  [facility]: [facility];  Service: Cardiovascular;  Laterality: N/A;   INCISE AND DRAIN ABCESS     abscess on right thigh and buttock   KNEE ARTHROSCOPY     left   LAPAROSCOPIC APPENDECTOMY N/A 09/19/2018   Procedure: APPENDECTOMY LAPAROSCOPIC;  Surgeon: Signe Mitzie LABOR, MD;  Location: [facility];  Service: General;  Laterality: N/A;   SHOULDER SURGERY  04/14/2009   right   Past Medical History:  Diagnosis Date   Abscess    increased drainage from abscess on buttock   Anal fistula    Anxiety    Bilateral hip pain 05/27/2015   Chronic pain syndrome 05/27/2015   Depression    sees Dr. Alvera   Diabetes mellitus without complication [facility])    Diverticulosis    Elevated alkaline phosphatase level    Fatty liver    Gallstones    Hiatal hernia    Hyperlipidemia    Hypertension    Internal  hemorrhoids    Sleep apnea    2008- sleep study, neg. for sleep apnea    Stroke (HCC)    SVT (supraventricular tachycardia)    Symptomatic cholelithiasis 09/11/2018   LMP 02/09/2017   Opioid Risk Score:   Fall Risk Score:  `1  Depression screen [facility] 2/9     11/06/2023    3:43 PM 10/17/2023    2:52 PM 10/07/2023    8:18 AM 08/07/2023    3:27 PM 07/16/2023    3:31 PM 05/08/2023    3:26 PM 03/21/2023    3:07 PM  Depression  screen PHQ 2/9  Decreased Interest 0 0 0 0 0 0 0  Down, Depressed, Hopeless 0 0 0 0 0 0 0  PHQ - 2 Score 0 0 0 0 0 0 0      Review of Systems  Musculoskeletal:  Positive for arthralgias, back pain and myalgias.       Bilateral hip pain, low back pain radiating down left leg  All other systems reviewed and are negative.      Objective:   Physical Exam Vitals and nursing note reviewed.  Musculoskeletal:     Comments: No Physical Exam Performed: Virtual Visit           Assessment & Plan:  1.Chronic Bilateral Hip pain L>R---endstage OA of hips..Continue HEP as tolerated and Heat and Ice Therapy. 02/20/2024 Refilled: Oxycodone  10/325 one tablet 5 times a day as needed for moderate pain #150. Second script sent for the following month.02/20/2024 We will continue the opioid monitoring program, this consists of regular clinic visits, examinations, urine drug screen, pill counts as well as use of [place]  Controlled Substance Reporting system. A 12 month History has been reviewed on the [place]  Controlled Substance Reporting System on 02/20/2024 2. Lumbar Radiculitis:  She is scheduled for ESI injection with Dr Carilyn. Continue medication regimen with Pamelor . Continue Cymbalta  and Continue  HEP as Tolerated.  Continue to Monitor. 02/20/2024.          -              3. Morbid obesity: Continue with healthy diet Regime and HEP. Encouraged to continue with healthy diet regime and HEP. 02/20/2024 4.Bilateral  Greater Trochanteric Bursitis:.Continue with  Heat and Ice Therapy. Continue to Monitor. 02/20/2024.   F/U in 2 months   Telephone Visit Established Patient  Location of patient: In her Home  Location of Provider: In the Office

## 2024-02-21 ENCOUNTER — Ambulatory Visit: Admitting: Medical

## 2024-02-21 VITALS — BP 110/64 | HR 77 | Temp 97.8°F | Wt 242.6 lb

## 2024-02-21 DIAGNOSIS — G894 Chronic pain syndrome: Secondary | ICD-10-CM

## 2024-02-21 DIAGNOSIS — E1142 Type 2 diabetes mellitus with diabetic polyneuropathy: Secondary | ICD-10-CM

## 2024-02-21 DIAGNOSIS — R7989 Other specified abnormal findings of blood chemistry: Secondary | ICD-10-CM

## 2024-02-21 DIAGNOSIS — S40021D Contusion of right upper arm, subsequent encounter: Secondary | ICD-10-CM

## 2024-02-21 DIAGNOSIS — R935 Abnormal findings on diagnostic imaging of other abdominal regions, including retroperitoneum: Secondary | ICD-10-CM

## 2024-02-21 DIAGNOSIS — N1832 Chronic kidney disease, stage 3b: Secondary | ICD-10-CM | POA: Diagnosis not present

## 2024-02-21 DIAGNOSIS — Z794 Long term (current) use of insulin: Secondary | ICD-10-CM

## 2024-02-21 NOTE — Progress Notes (Signed)
 Subjective: Chief Complaint  Patient presents with   Hospitalization Follow-up    Hospital follow-up for stomach pain. Saw GI and  recommended follow-up with PCP regarding CTAP findings, consider chest CT and possible IR consult to biopsy the right retrocrural and left periaortic density with blood work--CBC, BMP.    History of Present Illness [name] is a 63 year old female who presents for a hospital follow-up after an abnormal abdominal scan.  Was seen in the ED in past 8 days for abdominal pain.  She initially went to the hospital due to abdominal pain similar to previous episodes when her 'insides were inflamed.' She was treated with stomach medication, which helped as the pain resolved by last Friday. No fever since then. A CT scan of the abdomen and pelvis revealed two questionable areas that could be swollen lymph nodes or inflamed tissue. These areas were reportedly not present in a previous scan from June.  She underwent a recent CBC which showed elevated white blood cells, but blood cultures were negative for infection. She was seen by a gastroenterologist four days ago and a physical medicine specialist yesterday.  She has a history of a PET scan done almost ten years ago for an issue on her left side, similar to the current concern.  She has a large bruise on her forearm, which occurred during a blood draw attempt in the hospital. She describes the incident as painful and unexpected, noting that the nurse mentioned it would result in a significant bruise.  She is under pain management for hip issues. She has not had a recent EKG or follow-up with cardiology.  No fever, apetite changes, nausea, vomiting.   No other aggravating or relieving factors. No other complaint.  Past Medical History:  Diagnosis Date   Abscess    increased drainage from abscess on buttock   Anal fistula    Anxiety    Bilateral hip pain 05/27/2015   Chronic pain syndrome 05/27/2015    Depression    sees Dr. Alvera   Diabetes mellitus without complication [facility])    Diverticulosis    Elevated alkaline phosphatase level    Fatty liver    Gallstones    Hiatal hernia    Hyperlipidemia    Hypertension    Internal hemorrhoids    Sleep apnea    2008- sleep study, neg. for sleep apnea    Stroke [facility])    SVT (supraventricular tachycardia)    Symptomatic cholelithiasis 09/11/2018   Medications Ordered Prior to Encounter[1]  ROS as in subjective   Objective: BP 110/64   Pulse 77   Temp 97.8 F (36.6 C)   Wt 242 lb 9.6 oz (110 kg)   LMP 02/09/2017   SpO2 99%   BMI 42.97 kg/m   Gen: Well-developed, well-nourished, no acute distress Right proximal forearm anterior laterally with a proximately 6 mm diameter purplish and yellow hematoma, swollen and somewhat tender Rest of arm nontender and neurovascularly intact Abdomen: Nontender, no mass, no organomegaly, large pannus   CT abdomen pelvis 02/13/24 IMPRESSION: 1. Interval development of a 16 x 13 mm ill-defined right retrocrural density, possibly a lymph node or inflammatory process. Recommend clinical and imaging follow-up to assess stability or resolution. 2. A 21 x 11 mm ill-defined left periaortic density just below the left adrenal gland, concerning for either lymph node or inflammatory process. Recommend clinical and imaging follow-up to assess stability or resolution. 3. Stable enlargement of the left adrenal gland.    Assessment  and Plan Encounter Diagnoses  Name Primary?   Abnormal CT of the abdomen Yes   Abnormal thyroid  blood test    Chronic pain syndrome    CKD stage 3b, GFR 30-44 ml/min (HCC)    Type 2 diabetes mellitus with diabetic polyneuropathy, with long-term current use of insulin  (HCC)    Hematoma of arm, right, subsequent encounter     Evaluation of abnormal abdominal imaging findings Abnormal CT findings suggest possible swollen lymph nodes or inflamed tissue or other.Elevated WBC  noted on recent labs last week. Further investigation required.  I reviewed the recent emergency department visit, labs and imaging. - Ordered repeat blood work today - refer to interventional radiology for potential biopsy.  Chronic pain syndrome Chronic hip pain managed by pain management specialist. Previous interventions included injections. - Continue current pain management plan. - They have discussed the possibility of left hip surgery if not improving with steroid injections  Hematoma right arm - Advised heat intermittently over the next few days topically, arm elevation, and use compression sleeve such as Ace wrap daily for the next several days until improving  [name] was seen today for hospitalization follow-up.  Diagnoses and all orders for this visit:  Abnormal CT of the abdomen -     TSH + free T4 -     Lactate dehydrogenase -     CBC with Differential/Platelet -     Basic metabolic panel with GFR  Abnormal thyroid  blood test -     TSH + free T4  Chronic pain syndrome  CKD stage 3b, GFR 30-44 ml/min (HCC) -     Basic metabolic panel with GFR  Type 2 diabetes mellitus with diabetic polyneuropathy, with long-term current use of insulin  (HCC)  Hematoma of arm, right, subsequent encounter    F/u pending labs      [1]  Current Outpatient Medications on File Prior to Visit  Medication Sig Dispense Refill   albuterol  (VENTOLIN  HFA) 108 (90 Base) MCG/ACT inhaler Inhale 2 puffs into the lungs every 6 (six) hours as needed for wheezing or shortness of breath. 8 g 1   aspirin  EC 81 MG tablet Take 1 tablet (81 mg total) by mouth daily. 90 tablet 3   budesonide -formoterol  (SYMBICORT ) 160-4.5 MCG/ACT inhaler Inhale 2 puffs into the lungs 2 (two) times daily. 1 each 5   calcium  carbonate (CALCIUM  600) 600 MG TABS tablet Take 1 tablet (600 mg total) by mouth 2 (two) times daily with a meal. 180 tablet 3   Continuous Glucose Sensor (DEXCOM G7 SENSOR) MISC 1 Device by  Does not apply route as directed. 9 each 3   DULoxetine  (CYMBALTA ) 60 MG capsule Take 1 capsule by mouth twice daily 180 capsule 3   fenofibrate  (TRICOR ) 145 MG tablet Take 1 tablet (145 mg total) by mouth daily. 90 tablet 0   ferrous gluconate  (FERGON) 324 MG tablet Take 1 tablet (324 mg total) by mouth daily with breakfast. 90 tablet 0   furosemide  (LASIX ) 80 MG tablet Take 1 tablet by mouth once daily 90 tablet 3   Glucagon  (GVOKE HYPOPEN  2-PACK) 0.5 MG/0.1ML SOAJ Inject 1 Dose into the skin daily as needed. 0.1 mL 2   Insulin  Aspart, w/Niacinamide, (FIASP ) 100 UNIT/ML SOLN Increase  Fiasp  40 units with each meal  Fiasp  correctional insulin : ADD extra units on insulin  to your meal-time Fiasp  dose if your blood sugars are higher than 150. Use the scale below to help guide you. Max daily 100 units 100  mL 6   Insulin  Disposable Pump (OMNIPOD 5 G7 INTRO, GEN 5,) KIT 1 Device by Does not apply route every other day. 1 kit 0   Insulin  Disposable Pump (OMNIPOD 5 G7 PODS, GEN 5,) MISC 1 Device by Does not apply route every other day. 45 each 3   losartan  (COZAAR ) 25 MG tablet Take 1 tablet (25 mg total) by mouth daily. 90 tablet 1   methocarbamol  (ROBAXIN ) 500 MG tablet TAKE 1 TABLET BY MOUTH EVERY 8 HOURS AS NEEDED FOR MUSCLE SPASM 60 tablet 5   metoprolol  succinate (TOPROL -XL) 25 MG 24 hr tablet Take 1 tablet by mouth once daily 90 tablet 3   naloxone (NARCAN) nasal spray 4 mg/0.1 mL      nortriptyline  (PAMELOR ) 75 MG capsule Take 1 capsule by mouth at bedtime 90 capsule 1   omeprazole  (PRILOSEC ) 20 MG capsule Take 1 capsule by mouth once daily 90 capsule 1   oxyCODONE -acetaminophen  (PERCOCET) 10-325 MG tablet Take 1 tablet by mouth 5 (five) times daily. 150 tablet 0   pantoprazole  (PROTONIX ) 20 MG tablet Take 1 tablet (20 mg total) by mouth daily. 30 tablet 1   potassium chloride  (KLOR-CON ) 10 MEQ tablet Take 1 tablet (10 mEq total) by mouth 2 (two) times daily. 180 tablet 2   pregabalin  (LYRICA ) 50  MG capsule Take 1 capsule (50 mg total) by mouth 2 (two) times daily. 60 capsule 2   rosuvastatin  (CRESTOR ) 20 MG tablet Take 1 tablet (20 mg total) by mouth daily. 90 tablet 0   spironolactone  (ALDACTONE ) 25 MG tablet Take 0.5 tablets (12.5 mg total) by mouth daily. 30 tablet 2   sucralfate  (CARAFATE ) 1 g tablet Take 1 tablet (1 g total) by mouth 4 (four) times daily. 30 tablet 0   Vitamin D , Ergocalciferol , (DRISDOL ) 1.25 MG (50000 UNIT) CAPS capsule Take 1 capsule (50,000 Units total) by mouth every 7 (seven) days. 12 capsule 3   No current facility-administered medications on file prior to visit.

## 2024-02-22 LAB — CBC WITH DIFFERENTIAL/PLATELET
Basophils Absolute: 0.1 x10E3/uL (ref 0.0–0.2)
Basos: 1 %
EOS (ABSOLUTE): 0.3 x10E3/uL (ref 0.0–0.4)
Eos: 3 %
Hematocrit: 33.8 % — ABNORMAL LOW (ref 34.0–46.6)
Hemoglobin: 10.3 g/dL — ABNORMAL LOW (ref 11.1–15.9)
Immature Grans (Abs): 0.1 x10E3/uL (ref 0.0–0.1)
Immature Granulocytes: 1 %
Lymphocytes Absolute: 1.4 x10E3/uL (ref 0.7–3.1)
Lymphs: 15 %
MCH: 26.1 pg — ABNORMAL LOW (ref 26.6–33.0)
MCHC: 30.5 g/dL — ABNORMAL LOW (ref 31.5–35.7)
MCV: 86 fL (ref 79–97)
Monocytes Absolute: 0.5 x10E3/uL (ref 0.1–0.9)
Monocytes: 5 %
Neutrophils Absolute: 7.6 x10E3/uL — ABNORMAL HIGH (ref 1.4–7.0)
Neutrophils: 75 %
Platelets: 582 x10E3/uL — ABNORMAL HIGH (ref 150–450)
RBC: 3.94 x10E6/uL (ref 3.77–5.28)
RDW: 12.7 % (ref 11.7–15.4)
WBC: 9.9 x10E3/uL (ref 3.4–10.8)

## 2024-02-22 LAB — BASIC METABOLIC PANEL WITH GFR
BUN/Creatinine Ratio: 14 (ref 12–28)
BUN: 18 mg/dL (ref 8–27)
CO2: 21 mmol/L (ref 20–29)
Calcium: 10.2 mg/dL (ref 8.7–10.3)
Chloride: 105 mmol/L (ref 96–106)
Creatinine, Ser: 1.3 mg/dL — AB (ref 0.57–1.00)
Glucose: 189 mg/dL — AB (ref 70–99)
Potassium: 4.7 mmol/L (ref 3.5–5.2)
Sodium: 138 mmol/L (ref 134–144)
eGFR: 46 mL/min/1.73 — AB (ref >59)

## 2024-02-22 LAB — TSH+FREE T4
Free T4: 1.05 ng/dL (ref 0.82–1.77)
TSH: 0.706 u[IU]/mL (ref 0.450–4.500)

## 2024-02-22 LAB — LACTATE DEHYDROGENASE

## 2024-02-24 ENCOUNTER — Ambulatory Visit: Payer: Self-pay | Admitting: Medical

## 2024-02-24 ENCOUNTER — Other Ambulatory Visit: Payer: Self-pay | Admitting: Medical

## 2024-02-24 DIAGNOSIS — R935 Abnormal findings on diagnostic imaging of other abdominal regions, including retroperitoneum: Secondary | ICD-10-CM

## 2024-02-24 DIAGNOSIS — R1900 Intra-abdominal and pelvic swelling, mass and lump, unspecified site: Secondary | ICD-10-CM

## 2024-02-24 NOTE — Progress Notes (Signed)
 Thyroid  labs are okay.  Kidney marker stable.  Hemoglobin stable but low.  Platelets elevated  Expect phone call from referrals to interventional radiology for possible biopsy

## 2024-02-25 NOTE — Progress Notes (Deleted)
°  PROCEDURE RECORD [facility]   Name: [name] DOB:05/08/1960 MRN: [id number]  Date:02/25/2024  Physician: Prentice Compton MD   Nurse/CMA: Tye Kitty MA  Allergies: Allergies[1]  Consent Signed: {yes wn:[id number]}  Is patient diabetic? {yes no:314532}  CBG today? ***  Pregnant: {yes no:314532} LMP: Patient's last menstrual period was 02/09/2017. (age [age])  Anticoagulants: {Yes/No:19989} Anti-inflammatory: {Yes/No:19989} Antibiotics: {Yes/No:19989}  Procedure: Epidural Steroid Injection  Position: Prone Start Time: ***  End Time: ***  Fluoro Time: ***  RN/CMA      Time      BP      Pulse      Respirations      O2 Sat      S/S      Pain Level       D/C home with ***, patient A & O X 3, D/C instructions reviewed, and sits independently.         Subjective:    Patient ID: [name], female    DOB: 05-15-1960, 63 y.o.   MRN: [id number]  HPI    Review of Systems     Objective:   Physical Exam        Assessment & Plan:       [1]  Allergies Allergen Reactions   Jardiance  [Empagliflozin ] Other (See Comments)    Recurrent yeast infections    Mounjaro  [Tirzepatide ] Other (See Comments)    Stomach cramps per patient    Ozempic  (0.25 Or 0.5 Mg-Dose) [Semaglutide (0.25 Or 0.5mg -Dos)]     Severe nausea, bad gas   Tramadol  Nausea Only

## 2024-02-27 ENCOUNTER — Encounter: Payer: Self-pay | Admitting: Registered Nurse

## 2024-02-27 NOTE — Progress Notes (Signed)
 Pt notified via mychart. Reminder in epic for CT A/P bmp prior

## 2024-02-27 NOTE — Progress Notes (Signed)
 Linda/Dottie:  Pls contact patient and let her know Dr. Albertus agreed with plan for her to follow up with her PCP regarding : CTAP with contrast showed an interval development of a 16 x 13 mm ill-defined right retrocrural density (possibly a lymph node or inflammatory process), a 21 x 11 mm ill-defined left periaortic density just below the left adrenal gland concerning for either lymph node or inflammatory process and a stable enlargement of the left adrenal gland.    Dr. Albertus also recommended repeating CTAP with oral and IV contrast in 3 months. Pls enter CTAP with oral and IV contrast recall in 3 months. Send patient our lab for a BMP 2 to 3 days prior to CTAP date. THX.

## 2024-02-28 ENCOUNTER — Encounter: Admitting: Physical Medicine & Rehabilitation

## 2024-03-04 ENCOUNTER — Inpatient Hospital Stay: Admitting: Medical

## 2024-03-25 ENCOUNTER — Encounter: Payer: Self-pay | Admitting: Internal Medicine

## 2024-03-26 MED ORDER — DEXCOM G7 SENSOR MISC: 1.0000 | 3 refills | Status: DC

## 2024-03-26 MED ORDER — OMNIPOD 5 G7 INTRO (GEN 5) KIT: 1.0000 | PACK | 0 refills | Status: AC

## 2024-03-26 MED ORDER — OMNIPOD 5 G7 PODS (GEN 5) MISC: 1.0000 | 3 refills | Status: AC

## 2024-03-31 ENCOUNTER — Telehealth: Payer: Self-pay

## 2024-03-31 ENCOUNTER — Ambulatory Visit (INDEPENDENT_AMBULATORY_CARE_PROVIDER_SITE_OTHER): Admitting: Internal Medicine

## 2024-03-31 ENCOUNTER — Encounter: Payer: Self-pay | Admitting: Internal Medicine

## 2024-03-31 VITALS — BP 156/76 | Ht 63.0 in | Wt 243.0 lb

## 2024-03-31 DIAGNOSIS — E1159 Type 2 diabetes mellitus with other circulatory complications: Secondary | ICD-10-CM

## 2024-03-31 DIAGNOSIS — Z794 Long term (current) use of insulin: Secondary | ICD-10-CM

## 2024-03-31 DIAGNOSIS — E785 Hyperlipidemia, unspecified: Secondary | ICD-10-CM

## 2024-03-31 DIAGNOSIS — E1165 Type 2 diabetes mellitus with hyperglycemia: Secondary | ICD-10-CM

## 2024-03-31 DIAGNOSIS — N1832 Chronic kidney disease, stage 3b: Secondary | ICD-10-CM | POA: Diagnosis not present

## 2024-03-31 DIAGNOSIS — E0590 Thyrotoxicosis, unspecified without thyrotoxic crisis or storm: Secondary | ICD-10-CM | POA: Diagnosis not present

## 2024-03-31 DIAGNOSIS — E1122 Type 2 diabetes mellitus with diabetic chronic kidney disease: Secondary | ICD-10-CM | POA: Diagnosis not present

## 2024-03-31 LAB — POCT GLYCOSYLATED HEMOGLOBIN (HGB A1C): Hemoglobin A1C: 8.5 % — AB (ref 4.0–5.6)

## 2024-03-31 MED ORDER — DEXCOM G7 SENSOR MISC: 1.0000 | 3 refills | Status: AC

## 2024-03-31 NOTE — Progress Notes (Signed)
 "   Name: [name]  Age/ Sex: 64 y.o., female   MRN/ DOB: [id number], 1961-01-20     PCP: Bulah Alm RAMAN, PA-C   Reason for Endocrinology Evaluation: Type 2 Diabetes Mellitus  Initial Endocrine Consultative Visit: 07/06/2020    PATIENT IDENTIFIER: [name] is a 64 y.o. female with a past medical history of SVT, MNG, T2DM and Dyslipidemia . The patient has followed with Endocrinology clinic since 07/06/2020 for consultative assistance with management of her diabetes.  DIABETIC HISTORY:  Ms. Emry was diagnosed with DM in 2011 ,Ozempic - severe nausea . Insulin  started in 2015. Metformin - no intolerance , jardiance - no intolerance, soliqua . Her hemoglobin A1c has ranged from 6.5%  in 2016, peaking at 13.6% in 2021  THYROID  HISTROY : She was found to have hypermetabolic  MNG on PET scan in 2016 during evaluation of ovarian cyst. These results were confirmed with thyroid  ultrasound in 2017. She is S/P FNA of the isthmic and right lobe nodule with scant cellularity in 2017. She was lost to follow up until 06/2020, repeat ultrasound showed heterogenous gland but no specific nodules  Reduced Metformin  due to low GFR 01/2021  Aldo: renin normal 09/2020  Started Jardiance  09/2021 but this was discontinued by 07/2022 due to recurrent yeast infections  Metformin  discontinued 07/2022 due to a GFR 31  Mounjaro   2.5 mg caused GI side effects 09/2023  Patient was trained on OmniPod July, 2025   SUBJECTIVE:   During the last visit (11/22/2023): A1c 7.9%     Today (03/31/2024): [name] is here for a F/U on diabetes and MNG.  She has been checking glucose multiple times daily through Dexcom.   No nausea  No constipation or diarrhea   No palpitations    This patient with type 2 diabetes is treated with OmniPod (insulin  pump). During the visit the pump basal and bolus doses were reviewed including carb/insulin  rations and supplemental doses. The clinical list was  updated. The glucose meter download was reviewed in detail to determine if the current pump settings are providing the best glycemic control without excessive hypoglycemia.  Pump and CGM download:     Pump   OmniPod Settings   Insulin  type   Fiasp    Basal rate       0000 2.5 u/h               I:C ratio       0000 1:1    # 30 g     # 10 g with snack           Sensitivity       0000  20      Goal       0000  120          Type & Model of Pump: OmniPod Insulin  Type: Currently using Fiasp .   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 Alvera   Diabetes mellitus without complication [facility])    Diverticulosis    Elevated alkaline phosphatase level    Fatty liver    Gallstones    Hiatal hernia    Hyperlipidemia    Hypertension    Internal hemorrhoids    Sleep apnea    2008- sleep study, neg. for sleep apnea    Stroke [facility])    SVT (supraventricular tachycardia)    Symptomatic cholelithiasis 09/11/2018   Past Surgical History:  Past Surgical History:  Procedure Laterality Date   ANAL EXAMINATION UNDER ANESTHESIA  02/21/2011   anal fistula   BREAST EXCISIONAL BIOPSY Left    BREAST SURGERY  patient does not remember date of procedure   pull fluid off lft br   CHOLECYSTECTOMY N/A 09/12/2018   Procedure: LAPAROSCOPIC  CHOLECYSTECTOMY;  Surgeon: Aron Shoulders, MD;  Location: [facility] OR;  Service: General;  Laterality: N/A;   COLONOSCOPY  10/08/2016   2 day prep 1 TA,   ELECTROPHYSIOLOGIC STUDY N/A 05/05/2015   Procedure: SVT Ablation;  Surgeon: Will Gladis Norton, MD;  Location: [facility] INVASIVE CV LAB;  Service: Cardiovascular;  Laterality: N/A;   EP IMPLANTABLE DEVICE N/A 01/30/2016   Procedure: Loop Recorder Insertion;  Surgeon: Jerel Balding, MD;  Location: [facility] INVASIVE CV LAB;  Service: Cardiovascular;  Laterality: N/A;   INCISE AND DRAIN ABCESS     abscess on right thigh and buttock   KNEE ARTHROSCOPY     left   LAPAROSCOPIC APPENDECTOMY N/A 09/19/2018   Procedure: APPENDECTOMY LAPAROSCOPIC;  Surgeon: Signe Mitzie LABOR, MD;  Location: [facility] OR;  Service: General;  Laterality: N/A;   SHOULDER SURGERY  04/14/2009   right   Social History:  reports that she has never smoked. She has never used smokeless tobacco. She reports that she does not drink alcohol and does not use drugs. Family History:  Family History  Problem Relation Age of Onset   Hypertension Mother    Alzheimer's disease Mother    Diabetes Father    Breast cancer Sister 91   Anesthesia problems Neg Hx    Hypotension Neg Hx    Malignant hyperthermia Neg Hx    Pseudochol deficiency Neg Hx    Colon cancer Neg Hx    Esophageal cancer Neg Hx    Stomach cancer Neg Hx    Rectal cancer Neg Hx    Thyroid  disease Neg Hx      HOME MEDICATIONS: Allergies as of 03/31/2024       Reactions   Jardiance  [empagliflozin ] Other (See Comments)   Recurrent yeast infections    Mounjaro  [tirzepatide ] Other (See Comments)   Stomach cramps per patient    Ozempic  (0.25 Or 0.5 Mg-dose) [semaglutide (0.25 Or 0.5mg -dos)]    Severe nausea, bad gas   Tramadol  Nausea Only        Medication List        Accurate as of March 31, 2024  2:32 PM. If you have any questions, ask your nurse or doctor.          albuterol  108 (90 Base) MCG/ACT  inhaler Commonly known as: VENTOLIN  HFA Inhale 2 puffs into the lungs every 6 (six) hours as needed for wheezing or shortness of breath.   aspirin  EC 81 MG tablet Take 1 tablet (81 mg total) by mouth daily.   budesonide -formoterol  160-4.5 MCG/ACT  inhaler Commonly known as: Symbicort  Inhale 2 puffs into the lungs 2 (two) times daily.   calcium  carbonate 600 MG Tabs tablet Commonly known as: Calcium  600 Take 1 tablet (600 mg total) by mouth 2 (two) times daily with a meal.   Dexcom G7 Sensor Misc 1 Device by Does not apply route as directed.   DULoxetine  60 MG capsule Commonly known as: CYMBALTA  Take 1 capsule by mouth twice daily   fenofibrate  145 MG tablet Commonly known as: TRICOR  Take 1 tablet (145 mg total) by mouth daily.   ferrous gluconate  324 MG tablet Commonly known as: FERGON Take 1 tablet (324 mg total) by mouth daily with breakfast.   Fiasp  100 UNIT/ML Soln Generic drug: Insulin  Aspart (w/Niacinamide) Increase  Fiasp  40 units with each meal  Fiasp  correctional insulin : ADD extra units on insulin  to your meal-time Fiasp  dose if your blood sugars are higher than 150. Use the scale below to help guide you. Max daily 100 units   furosemide  80 MG tablet Commonly known as: LASIX  Take 1 tablet by mouth once daily   Gvoke HypoPen  2-Pack 0.5 MG/0.1ML Soaj Generic drug: Glucagon  Inject 1 Dose into the skin daily as needed.   losartan  25 MG tablet Commonly known as: COZAAR  Take 1 tablet (25 mg total) by mouth daily.   methocarbamol  500 MG tablet Commonly known as: ROBAXIN  TAKE 1 TABLET BY MOUTH EVERY 8 HOURS AS NEEDED FOR MUSCLE SPASM   metoprolol  succinate 25 MG 24 hr tablet Commonly known as: TOPROL -XL Take 1 tablet by mouth once daily   naloxone 4 MG/0.1ML Liqd nasal spray kit Commonly known as: NARCAN   nortriptyline  75 MG capsule Commonly known as: PAMELOR  Take 1 capsule by mouth at bedtime   omeprazole  20 MG capsule Commonly known as: PRILOSEC  Take  1 capsule by mouth once daily   Omnipod 5 G7 Pods (Gen 5) Misc 1 Device by Does not apply route every other day.   Omnipod 5 G7 Intro (Gen 5) Kit 1 Device by Does not apply route every other day.   oxyCODONE -acetaminophen  10-325 MG tablet Commonly known as: Percocet Take 1 tablet by mouth 5 (five) times daily.   pantoprazole  20 MG tablet Commonly known as: PROTONIX  Take 1 tablet (20 mg total) by mouth daily.   potassium chloride  10 MEQ tablet Commonly known as: KLOR-CON  Take 1 tablet (10 mEq total) by mouth 2 (two) times daily.   pregabalin  50 MG capsule Commonly known as: LYRICA  Take 1 capsule (50 mg total) by mouth 2 (two) times daily.   rosuvastatin  20 MG tablet Commonly known as: CRESTOR  Take 1 tablet (20 mg total) by mouth daily.   spironolactone  25 MG tablet Commonly known as: Aldactone  Take 0.5 tablets (12.5 mg total) by mouth daily.   sucralfate  1 g tablet Commonly known as: Carafate  Take 1 tablet (1 g total) by mouth 4 (four) times daily.   Vitamin D  (Ergocalciferol ) 1.25 MG (50000 UNIT) Caps capsule Commonly known as: DRISDOL  Take 1 capsule (50,000 Units total) by mouth every 7 (seven) days.         OBJECTIVE:   Vital Signs: LMP 02/09/2017   Wt Readings from Last 3 Encounters:  02/21/24 242 lb 9.6 oz (110 kg)  12/24/23 251 lb (113.9 kg)  12/17/23 249 lb 12.8 oz (113.3 kg)     Exam: General: Pt appears well and is in NAD Bilateral thyroid  nodules were appreciated on today's exam  Lungs: Clear with good BS bilat  Heart: RRR  Extremities: Trace pretibial edema.   Neuro: MS is good with appropriate affect, pt is alert and Ox3      DM foot exam: 03/31/2024   The skin of the feet is  without sores or ulcerations. The pedal pulses are 1+ on right and 1+ on left. The sensation is intact to a screening 5.07, 10 gram monofilament bilaterally     DATA REVIEWED:  Lab Results  Component Value Date   HGBA1C 7.9 (A) 11/22/2023   HGBA1C 9.8 (A)  09/04/2023   HGBA1C 10.2 (A) 06/04/2023    Latest Reference Range & Units 02/21/24 11:20  TSH 0.450 - 4.500 uIU/mL 0.706  T4,Free(Direct) 0.82 - 1.77 ng/dL 8.94     Latest Reference Range & Units 02/21/24 11:20  Sodium 134 - 144 mmol/L 138  Potassium 3.5 - 5.2 mmol/L 4.7  Chloride 96 - 106 mmol/L 105  CO2 20 - 29 mmol/L 21  Glucose 70 - 99 mg/dL 810 (H)  BUN 8 - 27 mg/dL 18  Creatinine 9.42 - 8.99 mg/dL 8.69 (H)  Calcium  8.7 - 10.3 mg/dL 89.7  BUN/Creatinine Ratio 12 - 28  14  eGFR >59 mL/min/1.73 46 (L)     Latest Reference Range & Units 10/07/23 08:53  Total CHOL/HDL Ratio 0.0 - 4.4 ratio 4.3  Cholesterol, Total 100 - 199 mg/dL 846  HDL Cholesterol >60 mg/dL 36 (L)  Triglycerides 0 - 149 mg/dL 862  VLDL Cholesterol Cal 5 - 40 mg/dL 24  LDL Chol Calc (NIH) 0 - 99 mg/dL 93    Latest Reference Range & Units 09/04/23 14:18  Microalb, Ur mg/dL 1.4  MICROALB/CREAT RATIO <30 mg/g creat 20  Creatinine, Urine 20 - 275 mg/dL 69     THYROID  ultrasound 09/21/2022  FINDINGS: Parenchymal Echotexture: Markedly heterogenous   Isthmus: 2.1 cm thickness, previously 3.1   Right lobe: 8 x 4.8 x 5 cm, previously 8.1 x 4.2 x 4.5   Left lobe: 7.2 x 5.5 cm, previously 7 x 3.7 x 3.9   _________________________________________________________   Estimated total number of nodules >/= 1 cm: 0   Number of spongiform nodules >/=  2 cm not described below (TR1): 0   Number of mixed cystic and solid nodules >/= 1.5 cm not described below (TR2): 0   _________________________________________________________   Heterogenous lobular parenchyma.   0.8 cm hypoechoic nodule without calcifications, superior left; This nodule does NOT meet TI-RADS criteria for biopsy or dedicated follow-up.   No other discrete lesions.  No regional cervical adenopathy.   IMPRESSION: Enlarged heterogenous thyroid  with no suspicious nodules. No recommendation for biopsy or follow-up.   The above is in  keeping with the ACR TI-RADS recommendations [facility] J[facility] [place] Radiol 2017;14:587-595    FNA isthmic nodule 1.8 cm  03/31/2015 Scant epithelium     FNA right lower nodule 4.8 cm 03/31/2015 Scant epithelium     ASSESSMENT / PLAN / RECOMMENDATIONS:   1) Type 2 Diabetes Mellitus, Poorly controlled, With Neuropathic, CKD III  and macrovascular complications - Most recent A1c of 8.5 %. Goal A1c <7.0%.     - A1c has trended up -She has been in consistent with using Dexcom due to increase in cost, a prescription of Dexcom will be faxed to DME supplier [facility]) as she is getting her OmniPod through them. -Intolerant to Jardiance  due to recurrent yeast infections -Intolerant to Ozempic  and Mounjaro  2.5 mg dose -We stopped  metformin  due to GFR <35 -Not a candidate for pioglitazone due to lower  extremity edema - Patient encouraged to continue to enter 25 g of carbohydrates with meal  - I will change her sensitivity factor from  20 to 15 - Patient was provided with# Dexcom sensors - Discussed the importance of staying in automatic mode is much as possible - I will increase her basal rate as well  MEDICATIONS:   Fiasp      Pump   OmniPod Settings   Insulin  type   Fiasp    Basal rate       0000 2.65 u/h               I:C ratio       0000 1:1    # 25 g               Sensitivity       0000  15      Goal       0000  120         EDUCATION / INSTRUCTIONS: BG monitoring instructions: Patient is instructed to check her blood sugars 3 times a day, before meals. Call [facility] if: BG persistently < 70  I reviewed the Rule of 15 for the treatment of hypoglycemia in detail with the patient. Literature supplied.   2) Diabetic complications:  Eye: Does not have known diabetic retinopathy.  Neuro/ Feet: Does  have known diabetic peripheral neuropathy .  Renal: Patient does not have known baseline CKD. She   is  on an ACEI/ARB at present.    3) MNG:    -No  local neck symptoms - She is S/P FNA of the isthmic and right nodule in 2017 with scant cellularity , she was lost to follow up until 07/2020 when a repeat Thyroid  ultrasound showed increase thyroid  heterogeneity but no actual thyroid  nodules, previously biopsied nodules have blended in with a heterogeneous background - Her last ultrasound was in 2024 and no follow-up was recommended at the time   4) Subclinical Hyperthyroidism:  -Patient asymptomatic - TFTs were normal from December, 2025   5) CKD III:  -GFR stable -I started her on ARB in 2024  Medication Continue losartan  25 mg daily  6) Dyslipidemia :   - Lipid panel acceptable 09/2023 - No changes  Medication  Continue rosuvastatin  20 mg daily Continue fenofibrate  145 mg daily     F/U in 4 months   Signed electronically by: Stefano Redgie Butts, MD  [facility]  [facility] [address] [place] [place], [place] [address] Phone: [phone] FAX: [phone]   CC: Bulah Alm RAMAN, PA-C [address] [place] [address] Phone: [phone]  Fax: [phone]  Return to Endocrinology clinic as below: Future Appointments  Date Time Provider Department Center  03/31/2024  3:00 PM Arrabella Westerman, Donell Redgie, MD [facility] None  04/21/2024 11:20 AM Debby Fidela CROME, NP [facility] CPR  10/19/2024  8:15 AM Tysinger, Alm RAMAN, PA-C [facility] [phone] [place]    "

## 2024-03-31 NOTE — Telephone Encounter (Signed)
 Dexcom order sent to [facility] with [employer]

## 2024-03-31 NOTE — Patient Instructions (Signed)
" °  Continue to enter 25 g of carbohydrates with each meal   -HOW TO TREAT LOW BLOOD SUGARS (Blood sugar LESS THAN 70 MG/DL) Please follow the RULE OF 15 for the treatment of hypoglycemia treatment (when your (blood sugars are less than 70 mg/dL)   STEP 1: Take 15 grams of carbohydrates when your blood sugar is low, which includes:  3-4 GLUCOSE TABS  OR 3-4 OZ OF JUICE OR REGULAR SODA OR ONE TUBE OF GLUCOSE GEL    STEP 2: RECHECK blood sugar in 15 MINUTES STEP 3: If your blood sugar is still low at the 15 minute recheck --> then, go back to STEP 1 and treat AGAIN with another 15 grams of carbohydrates. "

## 2024-04-16 ENCOUNTER — Other Ambulatory Visit: Payer: Self-pay | Admitting: Registered Nurse

## 2024-04-21 ENCOUNTER — Encounter: Attending: Registered Nurse | Admitting: Registered Nurse

## 2024-08-05 ENCOUNTER — Ambulatory Visit: Admitting: Internal Medicine

## 2024-10-19 ENCOUNTER — Encounter: Payer: Self-pay | Admitting: Medical
